# Patient Record
Sex: Male | Born: 1935
Health system: Southern US, Community
[De-identification: ages and names within clinical notes are randomized; demographics above are authoritative.]

## PROBLEM LIST (undated history)

## (undated) DIAGNOSIS — I219 Acute myocardial infarction, unspecified: Secondary | ICD-10-CM

## (undated) DIAGNOSIS — F419 Anxiety disorder, unspecified: Secondary | ICD-10-CM

## (undated) DIAGNOSIS — E66811 Obesity, class 1: Secondary | ICD-10-CM

## (undated) DIAGNOSIS — E785 Hyperlipidemia, unspecified: Secondary | ICD-10-CM

## (undated) DIAGNOSIS — M170 Bilateral primary osteoarthritis of knee: Secondary | ICD-10-CM

## (undated) DIAGNOSIS — E669 Obesity, unspecified: Secondary | ICD-10-CM

## (undated) DIAGNOSIS — I2510 Atherosclerotic heart disease of native coronary artery without angina pectoris: Secondary | ICD-10-CM

## (undated) DIAGNOSIS — R600 Localized edema: Secondary | ICD-10-CM

## (undated) DIAGNOSIS — Z860100 Personal history of colon polyps, unspecified: Secondary | ICD-10-CM

## (undated) DIAGNOSIS — Z9861 Coronary angioplasty status: Secondary | ICD-10-CM

## (undated) DIAGNOSIS — K219 Gastro-esophageal reflux disease without esophagitis: Secondary | ICD-10-CM

## (undated) DIAGNOSIS — G8929 Other chronic pain: Secondary | ICD-10-CM

## (undated) DIAGNOSIS — R609 Edema, unspecified: Secondary | ICD-10-CM

## (undated) DIAGNOSIS — I10 Essential (primary) hypertension: Secondary | ICD-10-CM

## (undated) DIAGNOSIS — M549 Dorsalgia, unspecified: Secondary | ICD-10-CM

## (undated) DIAGNOSIS — H269 Unspecified cataract: Secondary | ICD-10-CM

## (undated) DIAGNOSIS — C61 Malignant neoplasm of prostate: Secondary | ICD-10-CM

## (undated) DIAGNOSIS — G4733 Obstructive sleep apnea (adult) (pediatric): Secondary | ICD-10-CM

## (undated) DIAGNOSIS — I4819 Other persistent atrial fibrillation: Secondary | ICD-10-CM

## (undated) HISTORY — DX: Obesity, class 1: E66.811

## (undated) HISTORY — DX: Obstructive sleep apnea (adult) (pediatric): G47.33

## (undated) HISTORY — DX: Coronary angioplasty status: Z98.61

## (undated) HISTORY — PX: CHOLECYSTECTOMY: SHX55

## (undated) HISTORY — PX: CORONARY ANGIOPLASTY WITH STENT PLACEMENT: SHX49

## (undated) HISTORY — DX: Other persistent atrial fibrillation: I48.19

## (undated) HISTORY — DX: Bilateral primary osteoarthritis of knee: M17.0

## (undated) HISTORY — DX: Atherosclerotic heart disease of native coronary artery without angina pectoris: I25.10

## (undated) HISTORY — PX: OTHER SURGICAL HISTORY: SHX169

## (undated) HISTORY — PX: NEUROPLASTY / TRANSPOSITION MEDIAN NERVE AT CARPAL TUNNEL BILATERAL: SUR894

## (undated) HISTORY — DX: Obesity, unspecified: E66.9

## (undated) HISTORY — DX: Malignant neoplasm of prostate: C61

## (undated) HISTORY — PX: COLONOSCOPY: SHX174

---

## 1998-08-26 ENCOUNTER — Inpatient Hospital Stay (HOSPITAL_COMMUNITY): Admission: EM | Admit: 1998-08-26 | Discharge: 1998-08-27 | Payer: Self-pay | Admitting: *Deleted

## 1998-08-26 ENCOUNTER — Ambulatory Visit (HOSPITAL_BASED_OUTPATIENT_CLINIC_OR_DEPARTMENT_OTHER): Admission: RE | Admit: 1998-08-26 | Discharge: 1998-08-26 | Payer: Self-pay | Admitting: Orthopedic Surgery

## 1998-08-28 ENCOUNTER — Ambulatory Visit (HOSPITAL_COMMUNITY): Admission: RE | Admit: 1998-08-28 | Discharge: 1998-08-28 | Payer: Self-pay | Admitting: Cardiology

## 1998-08-28 ENCOUNTER — Encounter: Payer: Self-pay | Admitting: Cardiology

## 1999-03-24 ENCOUNTER — Ambulatory Visit (HOSPITAL_BASED_OUTPATIENT_CLINIC_OR_DEPARTMENT_OTHER): Admission: RE | Admit: 1999-03-24 | Discharge: 1999-03-24 | Payer: Self-pay | Admitting: Orthopedic Surgery

## 1999-11-15 ENCOUNTER — Ambulatory Visit (HOSPITAL_BASED_OUTPATIENT_CLINIC_OR_DEPARTMENT_OTHER): Admission: RE | Admit: 1999-11-15 | Discharge: 1999-11-15 | Payer: Self-pay | Admitting: Orthopedic Surgery

## 2000-09-25 ENCOUNTER — Ambulatory Visit (HOSPITAL_COMMUNITY): Admission: RE | Admit: 2000-09-25 | Discharge: 2000-09-25 | Payer: Self-pay | Admitting: Orthopedic Surgery

## 2000-09-25 ENCOUNTER — Encounter (INDEPENDENT_AMBULATORY_CARE_PROVIDER_SITE_OTHER): Payer: Self-pay

## 2002-03-18 ENCOUNTER — Encounter: Admission: RE | Admit: 2002-03-18 | Discharge: 2002-03-18 | Payer: Self-pay | Admitting: Orthopedic Surgery

## 2002-03-20 ENCOUNTER — Ambulatory Visit (HOSPITAL_BASED_OUTPATIENT_CLINIC_OR_DEPARTMENT_OTHER): Admission: RE | Admit: 2002-03-20 | Discharge: 2002-03-20 | Payer: Self-pay | Admitting: Orthopedic Surgery

## 2003-06-07 DIAGNOSIS — C61 Malignant neoplasm of prostate: Secondary | ICD-10-CM

## 2003-06-07 HISTORY — DX: Malignant neoplasm of prostate: C61

## 2003-06-07 HISTORY — PX: OTHER SURGICAL HISTORY: SHX169

## 2003-09-23 ENCOUNTER — Ambulatory Visit: Admission: RE | Admit: 2003-09-23 | Discharge: 2003-12-22 | Payer: Self-pay | Admitting: Radiation Oncology

## 2003-10-28 ENCOUNTER — Encounter: Admission: RE | Admit: 2003-10-28 | Discharge: 2003-10-28 | Payer: Self-pay | Admitting: Urology

## 2003-11-04 ENCOUNTER — Ambulatory Visit (HOSPITAL_COMMUNITY): Admission: RE | Admit: 2003-11-04 | Discharge: 2003-11-04 | Payer: Self-pay | Admitting: Urology

## 2003-11-04 ENCOUNTER — Ambulatory Visit (HOSPITAL_BASED_OUTPATIENT_CLINIC_OR_DEPARTMENT_OTHER): Admission: RE | Admit: 2003-11-04 | Discharge: 2003-11-04 | Payer: Self-pay | Admitting: Urology

## 2004-01-09 ENCOUNTER — Ambulatory Visit: Admission: RE | Admit: 2004-01-09 | Discharge: 2004-04-08 | Payer: Self-pay | Admitting: Radiation Oncology

## 2005-09-06 ENCOUNTER — Encounter: Admission: RE | Admit: 2005-09-06 | Discharge: 2005-09-06 | Payer: Self-pay | Admitting: Cardiology

## 2006-03-06 ENCOUNTER — Emergency Department (HOSPITAL_COMMUNITY): Admission: EM | Admit: 2006-03-06 | Discharge: 2006-03-06 | Payer: Self-pay | Admitting: Emergency Medicine

## 2007-08-27 ENCOUNTER — Encounter: Admission: RE | Admit: 2007-08-27 | Discharge: 2007-08-27 | Payer: Self-pay | Admitting: Cardiology

## 2007-12-01 ENCOUNTER — Emergency Department (HOSPITAL_COMMUNITY): Admission: EM | Admit: 2007-12-01 | Discharge: 2007-12-01 | Payer: Self-pay | Admitting: Emergency Medicine

## 2010-01-28 ENCOUNTER — Encounter: Admission: RE | Admit: 2010-01-28 | Discharge: 2010-01-28 | Payer: Self-pay | Admitting: Cardiology

## 2010-02-02 ENCOUNTER — Ambulatory Visit (HOSPITAL_COMMUNITY): Admission: RE | Admit: 2010-02-02 | Discharge: 2010-02-03 | Payer: Self-pay | Admitting: Cardiology

## 2010-02-12 ENCOUNTER — Ambulatory Visit (HOSPITAL_COMMUNITY): Admission: RE | Admit: 2010-02-12 | Discharge: 2010-02-13 | Payer: Self-pay | Admitting: Cardiology

## 2010-08-19 LAB — BASIC METABOLIC PANEL
BUN: 25 mg/dL — ABNORMAL HIGH (ref 6–23)
CO2: 25 mEq/L (ref 19–32)
Calcium: 8.6 mg/dL (ref 8.4–10.5)
Calcium: 9 mg/dL (ref 8.4–10.5)
Chloride: 102 mEq/L (ref 96–112)
Chloride: 100 mEq/L (ref 96–112)
Creatinine, Ser: 1 mg/dL (ref 0.4–1.5)
Creatinine, Ser: 1.04 mg/dL (ref 0.4–1.5)
GFR calc Af Amer: >60 mL/min (ref >60)
GFR calc non Af Amer: >60 mL/min (ref >60)
Glucose, Bld: 117 mg/dL — ABNORMAL HIGH (ref 70–99)
Glucose, Bld: 148 mg/dL — ABNORMAL HIGH (ref 70–99)
Glucose, Bld: 166 mg/dL — ABNORMAL HIGH (ref 70–99)
Potassium: 3.9 mEq/L (ref 3.5–5.1)
Potassium: 4.2 mEq/L (ref 3.5–5.1)
Potassium: 3.7 mEq/L (ref 3.5–5.1)
Sodium: 136 mEq/L (ref 135–145)
Sodium: 137 mEq/L (ref 135–145)

## 2010-08-19 LAB — CBC
MCHC: 34.4 g/dL (ref 30.0–36.0)
MCV: 93.7 fL (ref 78.0–100.0)
MCV: 94.1 fL (ref 78.0–100.0)
RBC: 4.41 MIL/uL (ref 4.22–5.81)
RBC: 4.52 MIL/uL (ref 4.22–5.81)
RDW: 13.2 % (ref 11.5–15.5)
WBC: 15.7 10*3/uL — ABNORMAL HIGH (ref 4.0–10.5)

## 2010-08-19 LAB — PROTIME-INR: INR: 0.98 (ref 0.00–1.49)

## 2010-10-22 NOTE — Op Note (Signed)
[facility]  Patient:    Jerry [name], Jerry [name]                   MRN: [id number] Proc. Date: 11/15/99 Adm. Date:  [id number] Disc. Date: [id number] Attending:  Alinda Deem                           Operative Report  PREOPERATIVE DIAGNOSIS:  Right knee medial meniscal tear.  POSTOPERATIVE DIAGNOSIS:  Right knee medial meniscal tear, lateral meniscal tear as well as chondromalacia lateral femoral condyle grade III to grade IV focal over an 8 mm area.  OPERATION:  Right knee arthroscopic partial medial and lateral meniscectomies and debridement of chondromalacia from the lateral femoral condyle.  SURGEON:  Alinda Deem, M.D.  FIRST ASSISTANT:  Dorthula Matas, P.A.C.  ANESTHESIA:  General LMA.  ESTIMATED BLOOD LOSS:  Minimal.  FLUID REPLACEMENT:  800 cc of crystalloid.  DRAINS PLACED:  None.  TOURNIQUET TIME:  None.  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  He feels clicking, popping and catching in the knee and pain that prevents him from walking more than 10-15 minutes before he must sit down.  He has also had recurrent effusions.  A plain radiograph showed loss of articular cartilage of the medial side of the knee consistent with medial joint arthritis.  In any event, having failed conservative treatment over many months, he now desires arthroscopic evaluation and  treatment of his right knee.  He had similar treatment to his left knee some months ago and has done well.  DESCRIPTION OF PROCEDURE:  The patient was identified by arm band and taken to the operating room at [facility].  Appropriate anesthetic monitors were attached,  and general LMA anesthesia induced with the patient in the supine position.  A lateral post was supplied to the table and the right lower extremity prepped and draped in the usual sterile fashion from the ankle to the midthigh.  Standard inferior medial and inferior lateral parapatellar portal were then made with a #11 blade allowing introduction of the arthroscope at the inferolateral portal and the outflow was ______ the medial portal.  Small bits of articular cartilage were noted to come through the outflow.  The trochlea and patella had grade I to grade II chondromalacia that was already lightly debrided.  Moving to the medial compartment we identified a large para big tear of the medial meniscus that was actually tucked up medial to the femoral condyle and required removal with a 3.5 Gator sucker shaver and pituitary rongeurs.  We then trimmed the rim of the medial meniscus back to a smooth edge and also noted chondromalacia of the medial femoral condyle of grade III which was also debrided.  The ACL and PCL were intact on the lateral side.  He had a degenerative tearing of the lateral meniscus which was debrided with a 3.5 mm Gator sucker shaver.  The gutters were cleared then he was taken through a range of motion and small bits of articular cartilage continuously taken out through the outflow.  The knee was then washed out with normal saline solution.  The arthroscopic instruments removed.  A dressing  of Xeroform, 4 x 8 dressings, sponges, Webril and an Ace wrap applied.  The patient did receive a gram of Ancef perioperatively because he was taking prednisone for a skin condition over the last week or two. DD:  11/15/99 TD:  11/18/99 Job: [id number]

## 2010-10-22 NOTE — Op Note (Signed)
[facility]  Patient:    Jerry [name], Jerry [name]                   MRN: [id number] Attending:  Artist Pais. Mina Marble, M.D.                           Operative Report  DATE OF SERVICE:  09/25/00  PREOPERATIVE DIAGNOSIS:  Right small, and right ring ______ synovitis.  POSTOPERATIVE DIAGNOSIS:  Same.  PROCEDURE:  A1 pulley release right long, ring, and small fingers.  SURGEON:  Artist Pais. Mina Marble, M.D.  ANESTHESIA:  General  TOURNIQUET TIME:  18 minutes.  COMPLICATIONS:  None.  DRAINS:  None.  DESCRIPTION OF PROCEDURE:  The patient was taken to the operating room and after the induction of adequate general anesthesia the right upper extremity was prepped and draped in the usual sterile fashion.  Esmarch was used to exsanguinate the limb and tourniquet was inflated to 250 mmHg at this point in time.  Transverse incisions were made over the A1 pulleys of the long, ring, and small fingers of the right hand.  Incision was taken down through the skin and and subcutaneous tissue until the neurovascular bundles were identified. These were retracted to the sides and the flexor tendons were identified.  The 15 blade was used to incise the A1 pulley and complete releases were performed on the long, ring, and small fingers of the right hand.  After this was done, the wounds were thoroughly irrigated and closed with 5-0 nylon and horizontal mattress sutures.  Sterile dressings, Xeroform, and 4 x 4s ______ and compression dressing was applied.  The patient tolerated the procedure well and went to recovery room in stable fashion. DD:  11/15/00 TD:  11/15/00 Job: [id number]

## 2010-10-22 NOTE — Op Note (Signed)
[facility]  Patient:    Jerry [name], Jerry [name]                 MRN: [id number] Proc. Date: 09/25/00 Adm. Date:  [id number] Attending:  Marlowe Shores                           Operative Report  PREOPERATIVE DIAGNOSIS:  Ventral incisional hernia versus umbilical hernia.  POSTOPERATIVE DIAGNOSIS:  Ventral incisional hernia.  OPERATION:  Exploration of hernia and repair with mesh.  SURGEON:  Timothy E. Earlene Plater, M.D.  ANESTHESIA:  General.  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  Because of its size, discomfort, and enlargement, he wished to have that repaired.  This is done in conjunction with Dr. Mina Marble with repair of right hand.  The patient agrees and understands the procedure.  DESCRIPTION OF PROCEDURE:  The patient was taken to the operating room by Dr. Mina Marble, placed supine.  General anesthesia induced.  His surgery was completed.  The patient was stable at the conclusion of the procedure, and the abdomen was shaved, prepped and draped in the usual fashion.  A moderate hernia was palpable and also reducible.  I entered the area through the previous infraumbilical incision from the laparoscopic cholecystectomy, dissected the umbilical skin loose, and found the fascia dehisced as would be typically seen following a laparoscopic cholecystectomy.  It did not appear to be a standard umbilical hernia.  The hernia had enlarged.  It was approximately 3 cm in horizontal length.  The sac was debrided and removed and passed off the field as a specimen.  The edges identified.  I dissected back from the edges approximately 2 cm all around and about the defect.  I closed the defect fascia to fascia with a running 0 Prolene suture, covered this with mesh, a 1 x 4, and sutured the mesh to the surrounding stronger fascia with running 2-0 PDS.   This completed the procedure.  The counts were correct.  No bleeding was obvious, and the wound was closed in layers with 4-0 Monocryl. Steri-Strips applied.  He tolerated it well.  Final counts correct.  Dry, sterile dressing applied.  He was awakened and taken to the recovery room in good condition. DD:  09/25/00 TD:  09/24/00 Job: [id number]

## 2010-10-22 NOTE — Op Note (Signed)
   NAME:  Jerry [name], Jerry [name]                    ACCOUNT NO.:  [id number]   MEDICAL RECORD NO.:  [id number]                   PATIENT TYPE:  AMB   LOCATION:  DSC                                  FACILITY:  [facility]   PHYSICIAN:  Artist Pais. Mina Marble, M.D.           DATE OF BIRTH:  [birth year]   DATE OF PROCEDURE:  03/20/2002  DATE OF DISCHARGE:                                 OPERATIVE REPORT   PREOPERATIVE DIAGNOSIS:  Right index finger stenosing synovitis.   POSTOPERATIVE DIAGNOSIS:  Right index finger stenosing synovitis.   PROCEDURE:  Release A1 pulley right index finger.   SURGEON:  Artist Pais. Mina Marble, M.D.   ASSISTANT:  Nurse.   ANESTHESIA:  General.   TOURNIQUET TIME:  11 minutes.   COMPLICATIONS:  None.   DRAINS:  No.   DESCRIPTION OF PROCEDURE:  The patient was taken to the operating room and  given adequate general anesthesia.  Right upper extremity prepped and draped  in usual sterile fashion.  An Esmarch was used to exsanguinate the limb.  Tourniquet was inflated to 250 mmHg.  At this point in time, a 1.5 cm  incision was made transversely over the A1 pulley index finger in the palm  of the right hand.  Incision was taken down through skin and subcutaneous  tissues.  Neurovascular bundles were identified and retracted.  Proximal A1  pulley was identified, split with tenotomy scissors, thus freeing up the FDS  and FP tendons.  The tendons were lysed of all adhesions.  The wound was  thoroughly irrigated and then closed with 5-0 nylon and horizontal mattress  sutures x 3.  Sterile dressing, Xeroform, 4 x 4 fluffs, compressive hand  dressing applied.  The patient tolerated the procedure well and was taken to  the Recovery Room in stable condition.                                               Artist Pais Mina Marble, M.D.    MAW/MEDQ  D:  03/20/2002  T:  03/21/2002  Job:  [id number]

## 2010-10-22 NOTE — Op Note (Signed)
NAME:  [name], [name]                    ACCOUNT NO.:  [id number]   MEDICAL RECORD NO.:  [id number]                   PATIENT TYPE:  OUT   LOCATION:  DFTL                                 FACILITY:  [facility]   PHYSICIAN:  Valetta Fuller, M.D.               DATE OF BIRTH:  12/02/1929   DATE OF PROCEDURE:  11/04/2003  DATE OF DISCHARGE:  11/04/2003                                 OPERATIVE REPORT   PREOPERATIVE DIAGNOSES:  Clinical stage T1C adenocarcinoma of the prostate.   POSTOPERATIVE DIAGNOSES:  Clinical stage T1C adenocarcinoma of the prostate.   PROCEDURE:  1. Implantation of I125 seeds.  2. Cystoscopy.   SURGEON:  Valetta Fuller, M.D.   ASSISTANT:  Maryln Gottron, M.D.   ANESTHESIA:  General.   INDICATIONS FOR PROCEDURE:  [name] is a 77 year old male.  He had  elevated PSA approximately a year ago which was negative. More recently his  PSA had increased to 7.1.  Repeat biopsy revealed positive biopsies for  Gleason 3+3=6 adenocarcinoma on the left side of his prostate. The patient  underwent extensive counseling with regard to treatment options. We  discussed with him the possibility of watchful waiting versus a radiation  approach. We also offered surgical intervention but felt that might be over  aggressive given his situation. After much discussion and consultation with  Dr. Dayton Scrape, the patient was elected to have interstitial seed implantation  as monotherapy.  He presents now for the procedure.   TECHNIQUE:  The patient was brought to the operating room where he had  induction of general anesthesia.  He was placed in the moderate lithotomy  position and prepped and draped in the normal manner.  A Foley catheter was  placed with contrast in the balloon. A fluoroscopy unit was brought in. A  real-time ultrasound probe was placed and fixed carefully to the table.  We  spent the initial 30-45 minutes getting the patient in the proper position.  We  attempted to place the prostate in exactly the same position as it was on  his planning ultrasound. Real-time voluming was done.  We matched up the  pretreatment plan and were very pleased with that.  Anchoring needles were  then placed. Once we were happy with positioning, we utilized real-time  fluoroscopy as well as real-time ultrasonography for placement of the seeds.  A total of 86 seeds were placed for a total dose of 14,400 cGy.  Each seed  had activity of 0.45 mCi per seed.  Again each needle passage was done with  real-time fluoroscopy as well as real-time ultrasonography. We matched this  up with the pretreatment plan and were very happy with the seed  distribution. At the completion of the procedure, the catheter was removed  and cystoscopy was performed. No seeds were seen within the prostatic  urethra or bladder. Another Foley catheter was placed and hooked up to a leg  bag.  The patient appeared to tolerate the procedure well and there were no  obvious complications.                                               Valetta Fuller, M.D.    DSG/MEDQ  D:  11/20/2003  T:  11/20/2003  Job:  [id number]   cc:   Maryln Gottron, M.D.  [address] [facility]  [place]  [place] [address]  Fax: [phone]

## 2010-11-24 HISTORY — PX: NM MYOVIEW LTD: HXRAD82

## 2011-03-03 LAB — COMPREHENSIVE METABOLIC PANEL
ALT: 9
AST: 25
Albumin: 3.8
CO2: 26
Calcium: 9
Creatinine, Ser: 1.06
GFR calc Af Amer: >60
Glucose, Bld: 100 — ABNORMAL HIGH
Total Bilirubin: 1.7 — ABNORMAL HIGH
Total Protein: 6.5

## 2011-03-03 LAB — DIFFERENTIAL
Basophils Absolute: 0
Lymphs Abs: 0.9
Monocytes Relative: 4
Neutro Abs: 15 — ABNORMAL HIGH

## 2011-03-03 LAB — POCT CARDIAC MARKERS
CKMB, poc: 1.5
Myoglobin, poc: 154
Operator id: 284251
Troponin i, poc: <0.05

## 2011-03-03 LAB — CBC
HCT: 47.8
Hemoglobin: 16.3
MCHC: 34
MCV: 93.4
Platelets: 269

## 2011-08-17 DIAGNOSIS — I2510 Atherosclerotic heart disease of native coronary artery without angina pectoris: Secondary | ICD-10-CM | POA: Diagnosis not present

## 2011-08-17 DIAGNOSIS — I10 Essential (primary) hypertension: Secondary | ICD-10-CM | POA: Diagnosis not present

## 2011-08-17 DIAGNOSIS — E782 Mixed hyperlipidemia: Secondary | ICD-10-CM | POA: Diagnosis not present

## 2011-08-18 DIAGNOSIS — M47817 Spondylosis without myelopathy or radiculopathy, lumbosacral region: Secondary | ICD-10-CM | POA: Diagnosis not present

## 2011-11-29 DIAGNOSIS — T148XXA Other injury of unspecified body region, initial encounter: Secondary | ICD-10-CM | POA: Diagnosis not present

## 2011-11-29 DIAGNOSIS — Z0000 Encounter for general adult medical examination without abnormal findings: Secondary | ICD-10-CM | POA: Diagnosis not present

## 2011-11-29 DIAGNOSIS — F411 Generalized anxiety disorder: Secondary | ICD-10-CM | POA: Diagnosis not present

## 2011-11-29 DIAGNOSIS — M159 Polyosteoarthritis, unspecified: Secondary | ICD-10-CM | POA: Diagnosis not present

## 2011-11-29 DIAGNOSIS — D485 Neoplasm of uncertain behavior of skin: Secondary | ICD-10-CM | POA: Diagnosis not present

## 2011-12-14 DIAGNOSIS — M8448XA Pathological fracture, other site, initial encounter for fracture: Secondary | ICD-10-CM | POA: Diagnosis not present

## 2012-02-29 DIAGNOSIS — M1710 Unilateral primary osteoarthritis, unspecified knee: Secondary | ICD-10-CM | POA: Diagnosis not present

## 2012-03-07 DIAGNOSIS — Z79899 Other long term (current) drug therapy: Secondary | ICD-10-CM | POA: Diagnosis not present

## 2012-03-07 DIAGNOSIS — R5383 Other fatigue: Secondary | ICD-10-CM | POA: Diagnosis not present

## 2012-03-07 DIAGNOSIS — M1710 Unilateral primary osteoarthritis, unspecified knee: Secondary | ICD-10-CM | POA: Diagnosis not present

## 2012-03-07 DIAGNOSIS — E782 Mixed hyperlipidemia: Secondary | ICD-10-CM | POA: Diagnosis not present

## 2012-03-13 DIAGNOSIS — Z9861 Coronary angioplasty status: Secondary | ICD-10-CM | POA: Diagnosis not present

## 2012-03-13 DIAGNOSIS — I10 Essential (primary) hypertension: Secondary | ICD-10-CM | POA: Diagnosis not present

## 2012-03-13 DIAGNOSIS — I2510 Atherosclerotic heart disease of native coronary artery without angina pectoris: Secondary | ICD-10-CM | POA: Diagnosis not present

## 2012-03-13 DIAGNOSIS — R0602 Shortness of breath: Secondary | ICD-10-CM | POA: Diagnosis not present

## 2012-03-22 DIAGNOSIS — Z23 Encounter for immunization: Secondary | ICD-10-CM | POA: Diagnosis not present

## 2012-04-04 DIAGNOSIS — M1710 Unilateral primary osteoarthritis, unspecified knee: Secondary | ICD-10-CM | POA: Diagnosis not present

## 2012-04-06 HISTORY — PX: NM MYOVIEW LTD: HXRAD82

## 2012-04-10 DIAGNOSIS — Z01810 Encounter for preprocedural cardiovascular examination: Secondary | ICD-10-CM | POA: Diagnosis not present

## 2012-04-10 DIAGNOSIS — R0989 Other specified symptoms and signs involving the circulatory and respiratory systems: Secondary | ICD-10-CM | POA: Diagnosis not present

## 2012-04-10 DIAGNOSIS — R0602 Shortness of breath: Secondary | ICD-10-CM | POA: Diagnosis not present

## 2012-04-10 DIAGNOSIS — I2510 Atherosclerotic heart disease of native coronary artery without angina pectoris: Secondary | ICD-10-CM | POA: Diagnosis not present

## 2012-04-19 ENCOUNTER — Other Ambulatory Visit: Payer: Self-pay | Admitting: Orthopedic Surgery

## 2012-04-26 ENCOUNTER — Encounter (HOSPITAL_COMMUNITY): Payer: Self-pay | Admitting: Respiratory Therapy

## 2012-04-27 DIAGNOSIS — H2510 Age-related nuclear cataract, unspecified eye: Secondary | ICD-10-CM | POA: Diagnosis not present

## 2012-04-30 ENCOUNTER — Encounter (HOSPITAL_COMMUNITY)
Admission: RE | Admit: 2012-04-30 | Discharge: 2012-04-30 | Disposition: A | Discharge status: Home / Self Care | Payer: Medicare Other | Source: Ambulatory Visit | Attending: Orthopedic Surgery | Admitting: Orthopedic Surgery

## 2012-04-30 ENCOUNTER — Encounter (HOSPITAL_COMMUNITY): Payer: Self-pay

## 2012-04-30 DIAGNOSIS — J984 Other disorders of lung: Secondary | ICD-10-CM | POA: Diagnosis not present

## 2012-04-30 DIAGNOSIS — Z01818 Encounter for other preprocedural examination: Secondary | ICD-10-CM | POA: Diagnosis not present

## 2012-04-30 HISTORY — DX: Dorsalgia, unspecified: M54.9

## 2012-04-30 HISTORY — DX: Personal history of colonic polyps: Z86.010

## 2012-04-30 HISTORY — DX: Unspecified cataract: H26.9

## 2012-04-30 HISTORY — DX: Hyperlipidemia, unspecified: E78.5

## 2012-04-30 HISTORY — DX: Acute myocardial infarction, unspecified: I21.9

## 2012-04-30 HISTORY — DX: Other chronic pain: G89.29

## 2012-04-30 HISTORY — DX: Edema, unspecified: R60.9

## 2012-04-30 HISTORY — DX: Localized edema: R60.0

## 2012-04-30 HISTORY — DX: Essential (primary) hypertension: I10

## 2012-04-30 HISTORY — DX: Anxiety disorder, unspecified: F41.9

## 2012-04-30 HISTORY — DX: Personal history of colon polyps, unspecified: Z86.0100

## 2012-04-30 HISTORY — DX: Gastro-esophageal reflux disease without esophagitis: K21.9

## 2012-04-30 LAB — CBC WITH DIFFERENTIAL/PLATELET
Basophils Absolute: 0.1 10*3/uL (ref 0.0–0.1)
Basophils Relative: 1 % (ref 0–1)
HCT: 43.6 % (ref 39.0–52.0)
Lymphocytes Relative: 22 % (ref 12–46)
MCHC: 35.8 g/dL (ref 30.0–36.0)
Monocytes Absolute: 0.5 10*3/uL (ref 0.1–1.0)
Neutro Abs: 6.3 10*3/uL (ref 1.7–7.7)
Neutrophils Relative %: 69 % (ref 43–77)
Platelets: 189 10*3/uL (ref 150–400)
RDW: 12.7 % (ref 11.5–15.5)
WBC: 9.1 10*3/uL (ref 4.0–10.5)

## 2012-04-30 LAB — BASIC METABOLIC PANEL
BUN: 19 mg/dL (ref 6–23)
Chloride: 102 mEq/L (ref 96–112)
Creatinine, Ser: 0.97 mg/dL (ref 0.50–1.35)
GFR calc Af Amer: 88 mL/min — ABNORMAL LOW (ref >90)
GFR calc non Af Amer: 76 mL/min — ABNORMAL LOW (ref >90)

## 2012-04-30 LAB — URINE MICROSCOPIC-ADD ON

## 2012-04-30 LAB — URINALYSIS, ROUTINE W REFLEX MICROSCOPIC
Bilirubin Urine: NEGATIVE
Glucose, UA: NEGATIVE mg/dL
Hgb urine dipstick: NEGATIVE
Protein, ur: 30 mg/dL — AB
Urobilinogen, UA: 1 mg/dL (ref 0.0–1.0)

## 2012-04-30 LAB — TYPE AND SCREEN
ABO/RH(D): A POS
Antibody Screen: NEGATIVE

## 2012-04-30 LAB — PROTIME-INR
INR: 1.04 (ref 0.00–1.49)
Prothrombin Time: 13.5 seconds (ref 11.6–15.2)

## 2012-04-30 LAB — SURGICAL PCR SCREEN
MRSA, PCR: NEGATIVE
Staphylococcus aureus: NEGATIVE

## 2012-04-30 LAB — APTT: aPTT: 28 seconds (ref 24–37)

## 2012-04-30 MED ORDER — CHLORHEXIDINE GLUCONATE 4 % EX LIQD: 60.0000 mL | Freq: Once | CUTANEOUS | Status: DC

## 2012-04-30 NOTE — Progress Notes (Signed)
Pt has never had a sleep study but states he is a mouth breather

## 2012-04-30 NOTE — Progress Notes (Signed)
04/30/12 1048  OBSTRUCTIVE SLEEP APNEA  Do you snore loudly (loud enough to be heard through closed doors)?  0  Do you often feel tired, fatigued, or sleepy during the daytime? 0  Has anyone observed you stop breathing during your sleep? 0  Do you have, or are you being treated for high blood pressure? 1  BMI more than 35 kg/m2? 1  Age over 76 years old? 1  Neck circumference greater than 40 cm/18 inches? 1  Gender: 1  Obstructive Sleep Apnea Score 5   Score 4 or greater  Results sent to PCP

## 2012-04-30 NOTE — Progress Notes (Addendum)
Cardiologist is Dr.Harding and to request last office visit  Stress test done about 2wks ago with Dr.Harding-to request  Heart cath in epic from 02/02/10 Denies ever having an echo  Denies CXR being done within the past yr EKG to be requested from Dr.Harding   Medical MD is Dr.Dean Clovis Riley with [facility]

## 2012-04-30 NOTE — Progress Notes (Signed)
Faxed sleep apnea form to Dr.Dean Clovis Riley

## 2012-04-30 NOTE — Pre-Procedure Instructions (Signed)
[address]  04/30/2012   Your procedure is scheduled on:  Wed, Dec 4 @ 12:15 PM  Report to [facility] at 10:15 AM.  Call this number if you have problems the morning of surgery: [phone]   Remember:   Do not eat food:After Midnight.    Take these medicines the morning of surgery with A SIP OF WATER: Valium(Diazepam),Esomeprazole(Nexium), and Metoprolol(Toprol)   Do not wear jewelry  Do not wear lotions, powders, or colognes. You may wear deodorant.  Men may shave face and neck.  Do not bring valuables to the hospital.  Contacts, dentures or bridgework may not be worn into surgery.  Leave suitcase in the car. After surgery it may be brought to your room.  For patients admitted to the hospital, checkout time is 11:00 AM the day of discharge.   Patients discharged the day of surgery will not be allowed to drive home.    Special Instructions: Shower using CHG 2 nights before surgery and the night before surgery.  If you shower the day of surgery use CHG.  Use special wash - you have one bottle of CHG for all showers.  You should use approximately 1/3 of the bottle for each shower.   Please read over the following fact sheets that you were given: Pain Booklet, Coughing and Deep Breathing, Blood Transfusion Information, Total Joint Packet, MRSA Information and Surgical Site Infection Prevention

## 2012-05-08 MED ORDER — CEFAZOLIN SODIUM-DEXTROSE 2-3 GM-% IV SOLR
2.0000 g | INTRAVENOUS | Status: AC
  Administered 2012-05-09: 2 g via INTRAVENOUS
  Filled 2012-05-08: qty 50

## 2012-05-08 NOTE — H&P (Signed)
TOTAL KNEE ADMISSION H&P  Patient is being admitted for right total knee arthroplasty.  Subjective:  Chief Complaint:right knee pain.  HPI: Jerry [name], 78 y.o. male, has a history of pain and functional disability in the right knee due to arthritis and has failed non-surgical conservative treatments for greater than 12 weeks to includeNSAID's and/or analgesics, corticosteriod injections, use of assistive devices and activity modification.  Onset of symptoms was gradual, starting >10 years ago with gradually worsening course since that time. The patient noted no past surgery on the right knee(s).  Patient currently rates pain in the right knee(s) at 7 out of 10 with activity. Patient has night pain, worsening of pain with activity and weight bearing and pain that interferes with activities of daily living.  Patient has evidence of subchondral cysts, periarticular osteophytes, joint subluxation and joint space narrowing by imaging studies. This patient has had several episodes when he nearly fell because of his knee pain.. There is no active infection.  There are no active problems to display for this patient.  Past Medical History  Diagnosis Date  . Hypertension     takes Verapamil,Losartan,and Metoprolol daily   . Peripheral edema     takes HCTZ daily  . Hyperlipidemia     takes Lipitor nightly  . Coronary artery disease   . Myocardial infarction     x 2;last time was about 8-39yrs ago   . Joint pain   . Chronic back pain     scoliosis--pt states unable to have surgery bc of age  . GERD (gastroesophageal reflux disease)     takes Nexium daily  . History of colon polyps   . Urinary frequency     takes Vesicare and Flomax daily  . Urinary urgency   . Cancer     prostate cancer  . Cataracts, bilateral     immature  . Anxiety     takes Valium prn  . Bleeding nose     occasionally    Past Surgical History  Procedure Date  . Right knee arthrsoscopy 46yrs ago  . Cyst  removed in college  . Coronary angioplasty 02/02/10    2 stents  . Wisdom teeth extraccted   . Colonoscopy   . Seeds placed into prostate    . Cholecystectomy   . Neuroplasty / transposition median nerve at carpal tunnel bilateral   . Right trigger finger     No prescriptions prior to admission   Allergies  Allergen Reactions  . Crab (Shellfish Allergy)   . Adhesive (Tape) Rash    History  Substance Use Topics  . Smoking status: Not on file  . Smokeless tobacco: Not on file     Comment: quit smoking pipe about 21yrs ago  . Alcohol Use: Yes     Comment: glass of wine daily    No family history on file.   Review of Systems  Constitutional: Negative.   HENT: Negative.   Eyes: Negative.   Respiratory: Negative.   Cardiovascular: Negative.   Gastrointestinal: Negative.   Genitourinary: Negative.   Musculoskeletal: Positive for joint pain and falls.  Skin: Negative.   Neurological: Negative.   Endo/Heme/Allergies: Negative.   Psychiatric/Behavioral: Negative.     Objective:  Physical Exam  Constitutional: He is oriented to person, place, and time. He appears well-developed and well-nourished.  HENT:  Head: Normocephalic.  Eyes: Pupils are equal, round, and reactive to light.  Cardiovascular: Normal heart sounds.   Respiratory: Breath sounds normal.  GI: Bowel sounds are normal.  Musculoskeletal:       Right knee: He exhibits decreased range of motion, effusion and deformity.  Neurological: He is alert and oriented to person, place, and time.    Vital signs in last 24 hours:    Labs:   There is no height or weight on file to calculate BMI.   Imaging Review Plain radiographs demonstrate severe degenerative joint disease of the right knee(s). The overall alignment issignificant varus. The bone quality appears to be adequate for age and reported activity level.  Assessment/Plan:  End stage arthritis, right knee   The patient history, physical examination,  clinical judgment of the provider and imaging studies are consistent with end stage degenerative joint disease of the right knee(s) and total knee arthroplasty is deemed medically necessary. The treatment options including medical management, injection therapy arthroscopy and arthroplasty were discussed at length. The risks and benefits of total knee arthroplasty were presented and reviewed. The risks due to aseptic loosening, infection, stiffness, patella tracking problems, thromboembolic complications and other imponderables were discussed. The patient acknowledged the explanation, agreed to proceed with the plan and consent was signed. Patient is being admitted for inpatient treatment for surgery, pain control, PT, OT, prophylactic antibiotics, VTE prophylaxis, progressive ambulation and ADL's and discharge planning. The patient is planning to be discharged to skilled nursing facility

## 2012-05-09 ENCOUNTER — Encounter (HOSPITAL_COMMUNITY)
Admission: RE | Disposition: A | Discharge status: Skilled Nursing Facility | Payer: Self-pay | Source: Ambulatory Visit | Attending: Orthopedic Surgery

## 2012-05-09 ENCOUNTER — Encounter (HOSPITAL_COMMUNITY): Payer: Self-pay | Admitting: *Deleted

## 2012-05-09 ENCOUNTER — Inpatient Hospital Stay (HOSPITAL_COMMUNITY): Payer: Medicare Other | Admitting: *Deleted

## 2012-05-09 ENCOUNTER — Inpatient Hospital Stay (HOSPITAL_COMMUNITY)
Admission: RE | Admit: 2012-05-09 | Discharge: 2012-05-12 | DRG: 470 | Disposition: A | Discharge status: Skilled Nursing Facility | Payer: Medicare Other | Source: Ambulatory Visit | Attending: Orthopedic Surgery | Admitting: Orthopedic Surgery

## 2012-05-09 DIAGNOSIS — I10 Essential (primary) hypertension: Secondary | ICD-10-CM | POA: Diagnosis not present

## 2012-05-09 DIAGNOSIS — M4120 Other idiopathic scoliosis, site unspecified: Secondary | ICD-10-CM | POA: Diagnosis present

## 2012-05-09 DIAGNOSIS — Z01818 Encounter for other preprocedural examination: Secondary | ICD-10-CM

## 2012-05-09 DIAGNOSIS — M1711 Unilateral primary osteoarthritis, right knee: Secondary | ICD-10-CM | POA: Diagnosis present

## 2012-05-09 DIAGNOSIS — Z01812 Encounter for preprocedural laboratory examination: Secondary | ICD-10-CM

## 2012-05-09 DIAGNOSIS — Z8546 Personal history of malignant neoplasm of prostate: Secondary | ICD-10-CM | POA: Diagnosis not present

## 2012-05-09 DIAGNOSIS — M1710 Unilateral primary osteoarthritis, unspecified knee: Principal | ICD-10-CM | POA: Diagnosis present

## 2012-05-09 DIAGNOSIS — IMO0002 Reserved for concepts with insufficient information to code with codable children: Secondary | ICD-10-CM | POA: Diagnosis not present

## 2012-05-09 DIAGNOSIS — R350 Frequency of micturition: Secondary | ICD-10-CM | POA: Diagnosis present

## 2012-05-09 DIAGNOSIS — I252 Old myocardial infarction: Secondary | ICD-10-CM

## 2012-05-09 DIAGNOSIS — E669 Obesity, unspecified: Secondary | ICD-10-CM | POA: Diagnosis present

## 2012-05-09 DIAGNOSIS — R279 Unspecified lack of coordination: Secondary | ICD-10-CM | POA: Diagnosis not present

## 2012-05-09 DIAGNOSIS — M549 Dorsalgia, unspecified: Secondary | ICD-10-CM | POA: Diagnosis not present

## 2012-05-09 DIAGNOSIS — Z9861 Coronary angioplasty status: Secondary | ICD-10-CM | POA: Diagnosis not present

## 2012-05-09 DIAGNOSIS — R609 Edema, unspecified: Secondary | ICD-10-CM | POA: Diagnosis present

## 2012-05-09 DIAGNOSIS — M6281 Muscle weakness (generalized): Secondary | ICD-10-CM | POA: Diagnosis not present

## 2012-05-09 DIAGNOSIS — Z96659 Presence of unspecified artificial knee joint: Secondary | ICD-10-CM | POA: Diagnosis not present

## 2012-05-09 DIAGNOSIS — I2109 ST elevation (STEMI) myocardial infarction involving other coronary artery of anterior wall: Secondary | ICD-10-CM | POA: Diagnosis not present

## 2012-05-09 DIAGNOSIS — E785 Hyperlipidemia, unspecified: Secondary | ICD-10-CM | POA: Diagnosis present

## 2012-05-09 DIAGNOSIS — F411 Generalized anxiety disorder: Secondary | ICD-10-CM | POA: Diagnosis not present

## 2012-05-09 DIAGNOSIS — M25569 Pain in unspecified knee: Secondary | ICD-10-CM | POA: Diagnosis not present

## 2012-05-09 DIAGNOSIS — Z86010 Personal history of colonic polyps: Secondary | ICD-10-CM | POA: Diagnosis not present

## 2012-05-09 DIAGNOSIS — K219 Gastro-esophageal reflux disease without esophagitis: Secondary | ICD-10-CM | POA: Diagnosis present

## 2012-05-09 DIAGNOSIS — K2100 Gastro-esophageal reflux disease with esophagitis, without bleeding: Secondary | ICD-10-CM | POA: Diagnosis not present

## 2012-05-09 DIAGNOSIS — Z471 Aftercare following joint replacement surgery: Secondary | ICD-10-CM | POA: Diagnosis not present

## 2012-05-09 DIAGNOSIS — G8918 Other acute postprocedural pain: Secondary | ICD-10-CM | POA: Diagnosis not present

## 2012-05-09 DIAGNOSIS — I2510 Atherosclerotic heart disease of native coronary artery without angina pectoris: Secondary | ICD-10-CM | POA: Diagnosis present

## 2012-05-09 DIAGNOSIS — N400 Enlarged prostate without lower urinary tract symptoms: Secondary | ICD-10-CM | POA: Diagnosis not present

## 2012-05-09 DIAGNOSIS — S8990XA Unspecified injury of unspecified lower leg, initial encounter: Secondary | ICD-10-CM | POA: Diagnosis not present

## 2012-05-09 HISTORY — PX: TOTAL KNEE ARTHROPLASTY: SHX125

## 2012-05-09 SURGERY — ARTHROPLASTY, KNEE, TOTAL
Anesthesia: General | Site: Knee | Laterality: Right | Wound class: Clean

## 2012-05-09 MED ORDER — HYDROMORPHONE HCL PF 1 MG/ML IJ SOLN
0.2500 mg | INTRAMUSCULAR | Status: DC | PRN
  Administered 2012-05-09 (×4): 0.5 mg via INTRAVENOUS

## 2012-05-09 MED ORDER — HYDROMORPHONE HCL PF 1 MG/ML IJ SOLN
INTRAMUSCULAR | Status: AC
  Administered 2012-05-09: 0.5 mg via INTRAVENOUS
  Filled 2012-05-09: qty 1

## 2012-05-09 MED ORDER — MIDAZOLAM HCL 5 MG/5ML IJ SOLN
INTRAMUSCULAR | Status: DC | PRN
  Administered 2012-05-09 (×2): 1 mg via INTRAVENOUS

## 2012-05-09 MED ORDER — METHOCARBAMOL 100 MG/ML IJ SOLN
500.0000 mg | Freq: Four times a day (QID) | INTRAVENOUS | Status: DC | PRN
  Administered 2012-05-09: 500 mg via INTRAVENOUS
  Filled 2012-05-09: qty 5

## 2012-05-09 MED ORDER — ONDANSETRON HCL 4 MG/2ML IJ SOLN
INTRAMUSCULAR | Status: DC | PRN
  Administered 2012-05-09: 4 mg via INTRAVENOUS

## 2012-05-09 MED ORDER — ACETAMINOPHEN 10 MG/ML IV SOLN
1000.0000 mg | Freq: Once | INTRAVENOUS | Status: AC | PRN
  Administered 2012-05-09: 1000 mg via INTRAVENOUS

## 2012-05-09 MED ORDER — LIDOCAINE HCL 4 % MT SOLN
OROMUCOSAL | Status: DC | PRN
  Administered 2012-05-09: 4 mL via TOPICAL

## 2012-05-09 MED ORDER — ONDANSETRON HCL 4 MG PO TABS: 4.0000 mg | ORAL_TABLET | Freq: Four times a day (QID) | ORAL | Status: DC | PRN

## 2012-05-09 MED ORDER — CELECOXIB 200 MG PO CAPS
200.0000 mg | ORAL_CAPSULE | Freq: Two times a day (BID) | ORAL | Status: DC
  Administered 2012-05-09 – 2012-05-12 (×6): 200 mg via ORAL
  Filled 2012-05-09 (×7): qty 1

## 2012-05-09 MED ORDER — SODIUM CHLORIDE 0.9 % IR SOLN
Status: DC | PRN
  Administered 2012-05-09: 3000 mL

## 2012-05-09 MED ORDER — HYDROCHLOROTHIAZIDE 25 MG PO TABS
12.5000 mg | ORAL_TABLET | Freq: Every day | ORAL | Status: DC
  Filled 2012-05-09: qty 0.5

## 2012-05-09 MED ORDER — FENTANYL CITRATE 0.05 MG/ML IJ SOLN
INTRAMUSCULAR | Status: AC
  Filled 2012-05-09: qty 2

## 2012-05-09 MED ORDER — HYDROMORPHONE HCL PF 1 MG/ML IJ SOLN
0.5000 mg | INTRAMUSCULAR | Status: DC | PRN
  Administered 2012-05-09: 0.5 mg via INTRAVENOUS

## 2012-05-09 MED ORDER — CEFUROXIME SODIUM 1.5 G IJ SOLR
INTRAMUSCULAR | Status: AC
  Filled 2012-05-09: qty 1.5

## 2012-05-09 MED ORDER — TAMSULOSIN HCL 0.4 MG PO CAPS
0.4000 mg | ORAL_CAPSULE | Freq: Every day | ORAL | Status: DC
  Administered 2012-05-09 – 2012-05-11 (×3): 0.4 mg via ORAL
  Filled 2012-05-09 (×4): qty 1

## 2012-05-09 MED ORDER — CEFUROXIME SODIUM 1.5 G IJ SOLR
INTRAMUSCULAR | Status: DC | PRN
  Administered 2012-05-09: 1.5 g

## 2012-05-09 MED ORDER — ONDANSETRON HCL 4 MG/2ML IJ SOLN: 4.0000 mg | Freq: Once | INTRAMUSCULAR | Status: DC | PRN

## 2012-05-09 MED ORDER — METOCLOPRAMIDE HCL 10 MG PO TABS: 5.0000 mg | ORAL_TABLET | Freq: Three times a day (TID) | ORAL | Status: DC | PRN

## 2012-05-09 MED ORDER — HYDROMORPHONE HCL PF 1 MG/ML IJ SOLN
1.0000 mg | INTRAMUSCULAR | Status: DC | PRN
  Administered 2012-05-09 – 2012-05-11 (×2): 1 mg via INTRAVENOUS
  Filled 2012-05-09 (×2): qty 1

## 2012-05-09 MED ORDER — METOPROLOL SUCCINATE ER 25 MG PO TB24
25.0000 mg | ORAL_TABLET | Freq: Every day | ORAL | Status: DC
  Administered 2012-05-11 – 2012-05-12 (×2): 25 mg via ORAL
  Filled 2012-05-09 (×3): qty 1

## 2012-05-09 MED ORDER — EPHEDRINE SULFATE 50 MG/ML IJ SOLN
INTRAMUSCULAR | Status: DC | PRN
  Administered 2012-05-09: 5 mg via INTRAVENOUS

## 2012-05-09 MED ORDER — KCL IN DEXTROSE-NACL 20-5-0.45 MEQ/L-%-% IV SOLN
INTRAVENOUS | Status: DC
  Administered 2012-05-09 – 2012-05-10 (×2): via INTRAVENOUS
  Filled 2012-05-09 (×10): qty 1000

## 2012-05-09 MED ORDER — BISACODYL 5 MG PO TBEC: 5.0000 mg | DELAYED_RELEASE_TABLET | Freq: Every day | ORAL | Status: DC | PRN

## 2012-05-09 MED ORDER — DIAZEPAM 5 MG PO TABS: 5.0000 mg | ORAL_TABLET | Freq: Every day | ORAL | Status: DC | PRN

## 2012-05-09 MED ORDER — METOCLOPRAMIDE HCL 5 MG/ML IJ SOLN: 5.0000 mg | Freq: Three times a day (TID) | INTRAMUSCULAR | Status: DC | PRN

## 2012-05-09 MED ORDER — MAGNESIUM HYDROXIDE 400 MG/5ML PO SUSP
30.0000 mL | Freq: Every day | ORAL | Status: DC | PRN
  Filled 2012-05-09: qty 30

## 2012-05-09 MED ORDER — TRIAMCINOLONE ACETONIDE 0.5 % EX CREA
1.0000 "application " | TOPICAL_CREAM | Freq: Every day | CUTANEOUS | Status: DC | PRN
  Filled 2012-05-09: qty 15

## 2012-05-09 MED ORDER — HYDROCHLOROTHIAZIDE 12.5 MG PO CAPS
12.5000 mg | ORAL_CAPSULE | Freq: Every day | ORAL | Status: DC
  Administered 2012-05-09 – 2012-05-12 (×2): 12.5 mg via ORAL
  Filled 2012-05-09 (×4): qty 1

## 2012-05-09 MED ORDER — DEXTROSE-NACL 5-0.45 % IV SOLN: INTRAVENOUS | Status: DC

## 2012-05-09 MED ORDER — MIDAZOLAM HCL 2 MG/2ML IJ SOLN
INTRAMUSCULAR | Status: AC
  Filled 2012-05-09: qty 2

## 2012-05-09 MED ORDER — LOSARTAN POTASSIUM 50 MG PO TABS
100.0000 mg | ORAL_TABLET | Freq: Every day | ORAL | Status: DC
  Administered 2012-05-09 – 2012-05-12 (×4): 100 mg via ORAL
  Filled 2012-05-09 (×4): qty 2

## 2012-05-09 MED ORDER — ONDANSETRON HCL 4 MG/2ML IJ SOLN
4.0000 mg | Freq: Four times a day (QID) | INTRAMUSCULAR | Status: DC | PRN
  Administered 2012-05-09: 4 mg via INTRAVENOUS
  Filled 2012-05-09: qty 2

## 2012-05-09 MED ORDER — LOSARTAN POTASSIUM 50 MG PO TABS
100.0000 mg | ORAL_TABLET | Freq: Every day | ORAL | Status: DC
  Filled 2012-05-09: qty 2

## 2012-05-09 MED ORDER — DEXAMETHASONE SODIUM PHOSPHATE 4 MG/ML IJ SOLN
INTRAMUSCULAR | Status: DC | PRN
  Administered 2012-05-09: 8 mg via INTRAVENOUS

## 2012-05-09 MED ORDER — ACETAMINOPHEN 650 MG RE SUPP: 650.0000 mg | Freq: Four times a day (QID) | RECTAL | Status: DC | PRN

## 2012-05-09 MED ORDER — ASPIRIN EC 325 MG PO TBEC
325.0000 mg | DELAYED_RELEASE_TABLET | Freq: Two times a day (BID) | ORAL | Status: DC
  Administered 2012-05-09 – 2012-05-12 (×6): 325 mg via ORAL
  Filled 2012-05-09 (×7): qty 1

## 2012-05-09 MED ORDER — VITAMIN D3 25 MCG (1000 UNIT) PO TABS
1000.0000 [IU] | ORAL_TABLET | Freq: Every day | ORAL | Status: DC
  Administered 2012-05-09 – 2012-05-12 (×4): 1000 [IU] via ORAL
  Filled 2012-05-09 (×4): qty 1

## 2012-05-09 MED ORDER — GLYCOPYRROLATE 0.2 MG/ML IJ SOLN
INTRAMUSCULAR | Status: DC | PRN
  Administered 2012-05-09: .8 mg via INTRAVENOUS

## 2012-05-09 MED ORDER — FENTANYL CITRATE 0.05 MG/ML IJ SOLN
INTRAMUSCULAR | Status: DC | PRN
  Administered 2012-05-09: 100 ug via INTRAVENOUS
  Administered 2012-05-09: 50 ug via INTRAVENOUS
  Administered 2012-05-09: 100 ug via INTRAVENOUS
  Administered 2012-05-09: 50 ug via INTRAVENOUS

## 2012-05-09 MED ORDER — METHOCARBAMOL 500 MG PO TABS
500.0000 mg | ORAL_TABLET | Freq: Four times a day (QID) | ORAL | Status: DC | PRN
  Administered 2012-05-10 – 2012-05-12 (×4): 500 mg via ORAL
  Filled 2012-05-09 (×6): qty 1

## 2012-05-09 MED ORDER — PHENOL 1.4 % MT LIQD: 1.0000 | OROMUCOSAL | Status: DC | PRN

## 2012-05-09 MED ORDER — NEOSTIGMINE METHYLSULFATE 1 MG/ML IJ SOLN
INTRAMUSCULAR | Status: DC | PRN
  Administered 2012-05-09: 5 mg via INTRAVENOUS

## 2012-05-09 MED ORDER — FLEET ENEMA 7-19 GM/118ML RE ENEM: 1.0000 | ENEMA | Freq: Once | RECTAL | Status: AC | PRN

## 2012-05-09 MED ORDER — DIPHENHYDRAMINE HCL 12.5 MG/5ML PO ELIX: 12.5000 mg | ORAL_SOLUTION | ORAL | Status: DC | PRN

## 2012-05-09 MED ORDER — ACETAMINOPHEN 325 MG PO TABS: 650.0000 mg | ORAL_TABLET | Freq: Four times a day (QID) | ORAL | Status: DC | PRN

## 2012-05-09 MED ORDER — ALUM & MAG HYDROXIDE-SIMETH 200-200-20 MG/5ML PO SUSP: 30.0000 mL | ORAL | Status: DC | PRN

## 2012-05-09 MED ORDER — OXYCODONE HCL 5 MG PO TABS
5.0000 mg | ORAL_TABLET | ORAL | Status: DC | PRN
  Administered 2012-05-10 – 2012-05-11 (×6): 10 mg via ORAL
  Administered 2012-05-11: 5 mg via ORAL
  Administered 2012-05-11 – 2012-05-12 (×2): 10 mg via ORAL
  Filled 2012-05-09 (×5): qty 2
  Filled 2012-05-09: qty 1
  Filled 2012-05-09 (×3): qty 2

## 2012-05-09 MED ORDER — DARIFENACIN HYDROBROMIDE ER 15 MG PO TB24
15.0000 mg | ORAL_TABLET | Freq: Every day | ORAL | Status: DC
  Administered 2012-05-09 – 2012-05-12 (×4): 15 mg via ORAL
  Filled 2012-05-09 (×4): qty 1

## 2012-05-09 MED ORDER — LIDOCAINE HCL (CARDIAC) 20 MG/ML IV SOLN
INTRAVENOUS | Status: DC | PRN
  Administered 2012-05-09: 30 mg via INTRAVENOUS

## 2012-05-09 MED ORDER — ROCURONIUM BROMIDE 100 MG/10ML IV SOLN
INTRAVENOUS | Status: DC | PRN
  Administered 2012-05-09: 50 mg via INTRAVENOUS

## 2012-05-09 MED ORDER — ATORVASTATIN CALCIUM 40 MG PO TABS
40.0000 mg | ORAL_TABLET | Freq: Every day | ORAL | Status: DC
  Administered 2012-05-09 – 2012-05-12 (×4): 40 mg via ORAL
  Filled 2012-05-09 (×4): qty 1

## 2012-05-09 MED ORDER — LACTATED RINGERS IV SOLN
INTRAVENOUS | Status: DC | PRN
  Administered 2012-05-09 (×2): via INTRAVENOUS

## 2012-05-09 MED ORDER — PANTOPRAZOLE SODIUM 40 MG PO TBEC
80.0000 mg | DELAYED_RELEASE_TABLET | Freq: Every day | ORAL | Status: DC
  Administered 2012-05-10 – 2012-05-12 (×3): 80 mg via ORAL
  Filled 2012-05-09 (×3): qty 2

## 2012-05-09 MED ORDER — MENTHOL 3 MG MT LOZG: 1.0000 | LOZENGE | OROMUCOSAL | Status: DC | PRN

## 2012-05-09 MED ORDER — VERAPAMIL HCL ER 240 MG PO TBCR
240.0000 mg | EXTENDED_RELEASE_TABLET | Freq: Every day | ORAL | Status: DC
  Administered 2012-05-09 – 2012-05-11 (×3): 240 mg via ORAL
  Filled 2012-05-09 (×4): qty 1

## 2012-05-09 MED ORDER — ACETAMINOPHEN 10 MG/ML IV SOLN
INTRAVENOUS | Status: AC
  Filled 2012-05-09: qty 100

## 2012-05-09 MED ORDER — ACETAMINOPHEN 10 MG/ML IV SOLN
1000.0000 mg | Freq: Four times a day (QID) | INTRAVENOUS | Status: AC
  Administered 2012-05-09 – 2012-05-10 (×4): 1000 mg via INTRAVENOUS
  Filled 2012-05-09 (×5): qty 100

## 2012-05-09 MED ORDER — NITROGLYCERIN 0.4 MG SL SUBL: 0.4000 mg | SUBLINGUAL_TABLET | SUBLINGUAL | Status: DC | PRN

## 2012-05-09 MED ORDER — PRASUGREL HCL 10 MG PO TABS
10.0000 mg | ORAL_TABLET | Freq: Every day | ORAL | Status: DC
  Administered 2012-05-10 – 2012-05-12 (×3): 10 mg via ORAL
  Filled 2012-05-09 (×3): qty 1

## 2012-05-09 MED ORDER — PROPOFOL 10 MG/ML IV BOLUS
INTRAVENOUS | Status: DC | PRN
  Administered 2012-05-09 (×2): 100 mg via INTRAVENOUS

## 2012-05-09 SURGICAL SUPPLY — 59 items
BANDAGE ESMARK 6X9 LF (GAUZE/BANDAGES/DRESSINGS) ×1 IMPLANT
BLADE SAG 18X100X1.27 (BLADE) ×2 IMPLANT
BLADE SAW SGTL 13X75X1.27 (BLADE) ×2 IMPLANT
BLADE SURG ROTATE 9660 (MISCELLANEOUS) IMPLANT
BNDG CMPR 9X6 STRL LF SNTH (GAUZE/BANDAGES/DRESSINGS) ×1
BNDG CMPR MED 10X6 ELC LF (GAUZE/BANDAGES/DRESSINGS) ×1
BNDG ELASTIC 6X10 VLCR STRL LF (GAUZE/BANDAGES/DRESSINGS) ×2 IMPLANT
BNDG ESMARK 6X9 LF (GAUZE/BANDAGES/DRESSINGS) ×2
BOWL SMART MIX CTS (DISPOSABLE) ×2 IMPLANT
CEMENT HV SMART SET (Cement) ×4 IMPLANT
CLOTH BEACON ORANGE TIMEOUT ST (SAFETY) ×2 IMPLANT
COVER BACK TABLE 24X17X13 BIG (DRAPES) IMPLANT
COVER SURGICAL LIGHT HANDLE (MISCELLANEOUS) ×2 IMPLANT
CUFF TOURNIQUET SINGLE 34IN LL (TOURNIQUET CUFF) ×1 IMPLANT
CUFF TOURNIQUET SINGLE 44IN (TOURNIQUET CUFF) IMPLANT
DRAPE EXTREMITY T 121X128X90 (DRAPE) ×2 IMPLANT
DRAPE U-SHAPE 47X51 STRL (DRAPES) ×2 IMPLANT
DURAPREP 26ML APPLICATOR (WOUND CARE) ×2 IMPLANT
ELECT REM PT RETURN 9FT ADLT (ELECTROSURGICAL) ×2
ELECTRODE REM PT RTRN 9FT ADLT (ELECTROSURGICAL) ×1 IMPLANT
EVACUATOR 1/8 PVC DRAIN (DRAIN) ×2 IMPLANT
GAUZE XEROFORM 1X8 LF (GAUZE/BANDAGES/DRESSINGS) ×2 IMPLANT
GLOVE BIO SURGEON STRL SZ 6.5 (GLOVE) ×1 IMPLANT
GLOVE BIO SURGEON STRL SZ7 (GLOVE) ×3 IMPLANT
GLOVE BIO SURGEON STRL SZ7.5 (GLOVE) ×3 IMPLANT
GLOVE BIOGEL PI IND STRL 7.0 (GLOVE) ×1 IMPLANT
GLOVE BIOGEL PI IND STRL 7.5 (GLOVE) IMPLANT
GLOVE BIOGEL PI IND STRL 8 (GLOVE) ×1 IMPLANT
GLOVE BIOGEL PI INDICATOR 7.0 (GLOVE) ×2
GLOVE BIOGEL PI INDICATOR 7.5 (GLOVE) ×1
GLOVE BIOGEL PI INDICATOR 8 (GLOVE) ×1
GLOVE SURG SS PI 6.5 STRL IVOR (GLOVE) ×1 IMPLANT
GOWN PREVENTION PLUS XLARGE (GOWN DISPOSABLE) ×3 IMPLANT
GOWN SRG XL XLNG 56XLVL 4 (GOWN DISPOSABLE) IMPLANT
GOWN STRL NON-REIN LRG LVL3 (GOWN DISPOSABLE) ×3 IMPLANT
GOWN STRL NON-REIN XL XLG LVL4 (GOWN DISPOSABLE) ×2
HANDPIECE INTERPULSE COAX TIP (DISPOSABLE) ×2
HOOD PEEL AWAY FACE SHEILD DIS (HOOD) ×5 IMPLANT
KIT BASIN OR (CUSTOM PROCEDURE TRAY) ×2 IMPLANT
KIT ROOM TURNOVER OR (KITS) ×2 IMPLANT
MANIFOLD NEPTUNE II (INSTRUMENTS) ×2 IMPLANT
NS IRRIG 1000ML POUR BTL (IV SOLUTION) ×2 IMPLANT
PACK TOTAL JOINT (CUSTOM PROCEDURE TRAY) ×2 IMPLANT
PAD ARMBOARD 7.5X6 YLW CONV (MISCELLANEOUS) ×3 IMPLANT
PADDING CAST COTTON 6X4 STRL (CAST SUPPLIES) ×2 IMPLANT
SET HNDPC FAN SPRY TIP SCT (DISPOSABLE) ×1 IMPLANT
SPONGE GAUZE 4X4 12PLY (GAUZE/BANDAGES/DRESSINGS) ×3 IMPLANT
STAPLER VISISTAT 35W (STAPLE) ×2 IMPLANT
SUCTION FRAZIER TIP 10 FR DISP (SUCTIONS) ×2 IMPLANT
SUT VIC AB 0 CTX 36 (SUTURE) ×2
SUT VIC AB 0 CTX36XBRD ANTBCTR (SUTURE) ×1 IMPLANT
SUT VIC AB 1 CTX 36 (SUTURE) ×4
SUT VIC AB 1 CTX36XBRD ANBCTR (SUTURE) ×1 IMPLANT
SUT VIC AB 2-0 CT1 27 (SUTURE) ×2
SUT VIC AB 2-0 CT1 TAPERPNT 27 (SUTURE) ×1 IMPLANT
TOWEL OR 17X24 6PK STRL BLUE (TOWEL DISPOSABLE) ×2 IMPLANT
TOWEL OR 17X26 10 PK STRL BLUE (TOWEL DISPOSABLE) ×2 IMPLANT
TRAY FOLEY CATH 14FR (SET/KITS/TRAYS/PACK) ×1 IMPLANT
WATER STERILE IRR 1000ML POUR (IV SOLUTION) ×4 IMPLANT

## 2012-05-09 NOTE — Progress Notes (Signed)
Orthopedic Tech Progress Note Patient Details:  Jerry [name] Oct 10, 1929 [id number]  CPM Right Knee CPM Right Knee: On Right Knee Flexion (Degrees): 60  Right Knee Extension (Degrees): 0  Additional Comments: trapeze bar patient helper   Nikki Dom 05/09/2012, 4:45 PM

## 2012-05-09 NOTE — Anesthesia Procedure Notes (Signed)
Anesthesia Regional Block:  Femoral nerve block  Pre-Anesthetic Checklist: ,, timeout performed, Correct Patient, Correct Site, Correct Laterality, Correct Procedure, Correct Position, site marked, Risks and benefits discussed,  Surgical consent,  Pre-op evaluation,  At surgeon's request and post-op pain management  Laterality: Right  Prep: chloraprep       Needles:  Injection technique: Single-shot  Needle Type: Echogenic Stimulator Needle     Needle Length:cm 9 cm Needle Gauge: 22 and 22 G    Additional Needles:  Procedures: ultrasound guided (picture in chart) and nerve stimulator Femoral nerve block Narrative:  Start time: 05/09/2012 1:00 PM End time: 05/09/2012 1:10 PM Injection made incrementally with aspirations every 5 mL.  Performed by: Personally   Additional Notes: 30 cc 0.5% marcaine with 1:200 Epi injected easily  Kipp Brood, MD

## 2012-05-09 NOTE — Progress Notes (Signed)
Requested info again from [facility]

## 2012-05-09 NOTE — Anesthesia Preprocedure Evaluation (Addendum)
Anesthesia Evaluation  Patient identified by MRN, date of birth, ID band Patient awake    Reviewed: Allergy & Precautions, H&P , NPO status , Patient's Chart, lab work & pertinent test results  Airway Mallampati: II      Dental  (+) Teeth Intact and Dental Advisory Given   Pulmonary shortness of breath and with exertion,  breath sounds clear to auscultation        Cardiovascular hypertension, Pt. on medications and Pt. on home beta blockers + angina + Past MI Rhythm:Regular Rate:Normal     Neuro/Psych    GI/Hepatic Neg liver ROS, GERD-  ,  Endo/Other  negative endocrine ROS  Renal/GU negative Renal ROS     Musculoskeletal   Abdominal (+) + obese,   Peds  Hematology negative hematology ROS (+)   Anesthesia Other Findings   Reproductive/Obstetrics                          Anesthesia Physical Anesthesia Plan  ASA: III  Anesthesia Plan: General   Post-op Pain Management:    Induction: Intravenous  Airway Management Planned: Oral ETT  Additional Equipment:   Intra-op Plan:   Post-operative Plan: Extubation in OR  Informed Consent:   Dental advisory given  Plan Discussed with: CRNA and Surgeon  Anesthesia Plan Comments: (DJD R.Knee Obesity Htn CAD S/P PTCAs last myoview 04/10/12 low risk  Plan GA with oral ETT and FNB  Kipp Brood, MD)        Anesthesia Quick Evaluation

## 2012-05-09 NOTE — Preoperative (Signed)
Beta Blockers   Reason not to administer Beta Blockers:Not Applicable, Took at 0800

## 2012-05-09 NOTE — Progress Notes (Signed)
Orthopedic Tech Progress Note Patient Details:  Jerry [name] 04-Jul-1929 [id number]  Patient ID: Jerry [name], male   DOB: [birth year], 78 y.o.   MRN: [id number] Viewed order from rn order list  Nikki Dom 05/09/2012, 4:45 PM

## 2012-05-09 NOTE — Interval H&P Note (Signed)
History and Physical Interval Note:  05/09/2012 12:57 PM  Jerry [name]  has presented today for surgery, with the diagnosis of OSTEOARTHRITIS RIGHT KNEE  The various methods of treatment have been discussed with the patient and family. After consideration of risks, benefits and other options for treatment, the patient has consented to  Procedure(s) (LRB) with comments: TOTAL KNEE ARTHROPLASTY (Right) as a surgical intervention .  The patient's history has been reviewed, patient examined, no change in status, stable for surgery.  I have reviewed the patient's chart and labs.  Questions were answered to the patient's satisfaction.     Nestor Lewandowsky

## 2012-05-09 NOTE — Op Note (Signed)
PATIENT ID:      Jerry [name]  MRN:     [id number] DOB/AGE:    1930-05-13 / 78 y.o.       OPERATIVE REPORT    DATE OF PROCEDURE:  05/09/2012       PREOPERATIVE DIAGNOSIS:   OSTEOARTHRITIS RIGHT KNEE      There is no height or weight on file to calculate BMI.                                                        POSTOPERATIVE DIAGNOSIS:   OSTEOARTHRITIS RIGHT KNEE                                                                      PROCEDURE:  Procedure(s): TOTAL KNEE ARTHROPLASTY Using Depuy Sigma RP implants #4R Femur, #5Tibia, 10mm sigma RP bearing, 38 Patella     SURGEON: Sydne Krahl J    ASSISTANT:   Shirl Harris PA-C   (Present and scrubbed throughout the case, critical for assistance with exposure, retraction, instrumentation, and closure.)         ANESTHESIA: GET with Femoral Nerve Block  DRAINS: foley, 2 medium hemovac in knee   TOURNIQUET TIME:   COMPLICATIONS:  None     SPECIMENS: None   INDICATIONS FOR PROCEDURE: The patient has  OSTEOARTHRITIS RIGHT KNEE, varus deformities, XR shows bone on bone arthritis. Patient has failed all conservative measures including anti-inflammatory medicines, narcotics, attempts at  exercise and weight loss, cortisone injections and viscosupplementation.  Risks and benefits of surgery have been discussed, questions answered.   DESCRIPTION OF PROCEDURE: The patient identified by armband, received  right femoral nerve block and IV antibiotics, in the holding area at [facility]. Patient taken to the operating room, appropriate anesthetic  monitors were attached General endotracheal anesthesia induced with  the patient in supine position, Foley catheter was inserted. Tourniquet  applied high to the operative thigh. Lateral post and foot positioner  applied to the table, the lower extremity was then prepped and draped  in usual sterile fashion from the ankle to the tourniquet. Time-out procedure was performed. The limb  was wrapped with an Esmarch bandage and the tourniquet inflated to 350 mmHg. We began the operation by making the anterior midline incision starting at handbreadth above the patella going over the patella 1 cm medial to and  4 cm distal to the tibial tubercle. Small bleeders in the skin and the  subcutaneous tissue identified and cauterized. Transverse retinaculum was incised and reflected medially and a medial parapatellar arthrotomy was accomplished. the patella was everted and theprepatellar fat pad resected. The superficial medial collateral  ligament was then elevated from anterior to posterior along the proximal  flare of the tibia and anterior half of the menisci resected. The knee was hyperflexed exposing bone on bone arthritis. Peripheral and notch osteophytes as well as the cruciate ligaments were then resected. We continued to  work our way around posteriorly along the proximal tibia, and externally  rotated the tibia subluxing it out from underneath the femur.  A McHale  retractor was placed through the notch and a lateral Hohmann retractor  placed, and we then drilled through the proximal tibia in line with the  axis of the tibia followed by an intramedullary guide rod and 2-degree  posterior slope cutting guide. The tibial cutting guide was pinned into place  allowing resection of 4 mm of bone medially and about 9 mm of bone  laterally because of her varus deformity. Satisfied with the tibial resection, we then  entered the distal femur 2 mm anterior to the PCL origin with the  intramedullary guide rod and applied the distal femoral cutting guide  set at 11mm, with 5 degrees of valgus. This was pinned along the  epicondylar axis. At this point, the distal femoral cut was accomplished without difficulty. We then sized for a #4R femoral component and pinned the guide in 3 degrees of external rotation.The chamfer cutting guide was pinned into place. The anterior, posterior, and chamfer cuts  were accomplished without difficulty followed by  the Sigma RP box cutting guide and the box cut. We also removed posterior osteophytes from the posterior femoral condyles. At this  time, the knee was brought into full extension. We checked our  extension and flexion gaps and found them symmetric at 10mm.  The patella thickness measured at 26 mm. We set the cutting guide at 15 and removed the posterior 11 mm  of the patella sized for 38 button and drilled the lollipop. The knee  was then once again hyperflexed exposing the proximal tibia. We sized for a #5 tibial base plate, applied the smokestack and the conical reamer followed by the the Delta fin keel punch. We then hammered into place the Sigma RP trial femoral component, inserted a 10-mm trial bearing, trial patellar button, and took the knee through range of motion from 0-130 degrees. No thumb pressure was required for patellar  tracking. At this point, all trial components were removed, a double batch of DePuy HV cement with 1500 mg of Zinacef was mixed and applied to all bony metallic mating surfaces except for the posterior condyles of the femur itself. In order, we  hammered into place the tibial tray and removed excess cement, the femoral component and removed excess cement, a 10-mm Sigma RP bearing  was inserted, and the knee brought to full extension with compression.  The patellar button was clamped into place, and excess cement  removed. While the cement cured the wound was irrigated out with normal saline solution pulse lavage, and medium Hemovac drains were placed from an anterolateral  approach. Ligament stability and patellar tracking were checked and found to be excellent. The parapatellar arthrotomy was closed with  running #1 Vicryl suture. The subcutaneous tissue with 0 and 2-0 undyed  Vicryl suture, and the skin with skin staples. A dressing of Xeroform,  4 x 4, dressing sponges, Webril, and Ace wrap applied. The patient   awakened, extubated, and taken to recovery room without difficulty.   Nestor Lewandowsky 05/09/2012, 3:33 PM

## 2012-05-09 NOTE — Anesthesia Postprocedure Evaluation (Signed)
  Anesthesia Post-op Note  Patient: Jerry [name]  Procedure(s) Performed: Procedure(s) (LRB) with comments: TOTAL KNEE ARTHROPLASTY (Right)  Patient Location: PACU  Anesthesia Type:General  Level of Consciousness: awake, alert  and oriented  Airway and Oxygen Therapy: Patient Spontanous Breathing and Patient connected to nasal cannula oxygen  Post-op Pain: mild  Post-op Assessment: Post-op Vital signs reviewed and Patient's Cardiovascular Status Stable  Post-op Vital Signs: stable  Complications: No apparent anesthesia complications

## 2012-05-09 NOTE — Transfer of Care (Signed)
Immediate Anesthesia Transfer of Care Note  Patient: Jerry [name]  Procedure(s) Performed: Procedure(s) (LRB) with comments: TOTAL KNEE ARTHROPLASTY (Right)  Patient Location: PACU  Anesthesia Type:General and GA combined with regional for post-op pain  Level of Consciousness: awake  Airway & Oxygen Therapy: Patient Spontanous Breathing and Patient connected to face mask oxygen  Post-op Assessment: Report given to PACU RN and Post -op Vital signs reviewed and stable  Post vital signs: Reviewed and stable  Complications: No apparent anesthesia complications

## 2012-05-10 ENCOUNTER — Encounter (HOSPITAL_COMMUNITY): Payer: Self-pay | Admitting: Orthopedic Surgery

## 2012-05-10 LAB — CBC
Hemoglobin: 11.7 g/dL — ABNORMAL LOW (ref 13.0–17.0)
MCH: 32 pg (ref 26.0–34.0)
MCV: 91.5 fL (ref 78.0–100.0)
RBC: 3.66 MIL/uL — ABNORMAL LOW (ref 4.22–5.81)
WBC: 13.5 10*3/uL — ABNORMAL HIGH (ref 4.0–10.5)

## 2012-05-10 LAB — BASIC METABOLIC PANEL
CO2: 26 mEq/L (ref 19–32)
Calcium: 8.5 mg/dL (ref 8.4–10.5)
Chloride: 102 mEq/L (ref 96–112)
Creatinine, Ser: 0.84 mg/dL (ref 0.50–1.35)
Glucose, Bld: 172 mg/dL — ABNORMAL HIGH (ref 70–99)

## 2012-05-10 NOTE — Clinical Social Work Psychosocial (Signed)
Clinical Social Worker received referral for the potential need for post acute placement at a SNF. CSW reviewed chart and met with patient at bedside. CSW introduced self, explained role, and provided emotional support. CSW discussed skilled nursing home placement, reviewed the process of placement and answered all the patient's questions. After reviewing the SNF list, Patient reported that he is agreeable to CSW seeking SNF placement. Patient reported that he toured [facility] and this is his first choice.  CSW encouraged patient to ask questions as needed of CSW and medical staff. CSW will begin the placement process and will continue to follow and assist with all d/c planning.  Patient was very appreciative of support and information provided by CSW. CSW will continue to follow and will assist with all d/c planning needs  Sabino Niemann, MSW [phone]

## 2012-05-10 NOTE — Progress Notes (Signed)
UR COMPLETED  

## 2012-05-10 NOTE — Progress Notes (Signed)
Patient ID: Jerry [name], male   DOB: 09/30/1929, 78 y.o.   MRN: [id number] PATIENT ID: Jerry [name]  MRN: [id number]  DOB/AGE:  Oct 30, 1929 / 78 y.o.  1 Day Post-Op Procedure(s) (LRB): TOTAL KNEE ARTHROPLASTY (Right)    PROGRESS NOTE Subjective: Patient is alert, oriented, 1+ Nausea,  1+Vomiting, yes passing gas, no Bowel Movement. Taking PO sips. Denies SOB, Chest or Calf Pain. Using Incentive Spirometer, PAS in place. Ambulate WBAT, CPM 0-40 Patient reports pain as 3 on 0-10 scale  .    Objective: Vital signs in last 24 hours: Filed Vitals:   05/09/12 1832 05/09/12 2103 05/10/12 0200 05/10/12 0600  BP: 137/70 148/91 130/80 125/77  Pulse: 81 86 95 88  Temp: 98.3 F (36.8 C) 99.6 F (37.6 C) 98.1 F (36.7 C) 98 F (36.7 C)  TempSrc:      Resp: 16 16 18 18   SpO2: 92% 95% 97% 97%      Intake/Output from previous day: I/O last 3 completed shifts: In: 3500 [P.O.:600; I.V.:2900] Out: 1505 [Urine:880; Drains:575; Blood:50]   Intake/Output this shift:     LABORATORY DATA:  Basename 05/10/12 0540  WBC 13.5*  HGB 11.7*  HCT 33.5*  PLT 157  NA 138  K 4.0  CL 102  CO2 26  BUN 21  CREATININE 0.84  GLUCOSE 172*  GLUCAP --  INR --  CALCIUM 8.5    Examination: Neurologically intact ABD soft Neurovascular intact Sensation intact distally Intact pulses distally Dorsiflexion/Plantar flexion intact Incision: scant drainage No cellulitis present Compartment soft}  Assessment:   1 Day Post-Op Procedure(s) (LRB): TOTAL KNEE ARTHROPLASTY (Right) ADDITIONAL DIAGNOSIS:  Hypertension  Plan: PT/OT WBAT, CPM 5/hrs day until ROM 0-90 degrees, then D/C CPM DVT Prophylaxis:  SCDx72hrs, ASA 325 mg BID x 2 weeks DISCHARGE PLAN: Skilled Nursing Facility/Rehab DISCHARGE NEEDS: HHPT, HHRN, CPM, Walker and 3-in-1 comode seat     Holt Woolbright J 05/10/2012, 8:02 AM

## 2012-05-10 NOTE — Progress Notes (Signed)
Physical Therapy Treatment Patient Details Name: Jerry [name] MRN: [id number] DOB: 01-12-1930 Today's Date: 05/10/2012 Time: 7846-9629 PT Time Calculation (min): 39 min  PT Assessment / Plan / Recommendation Comments on Treatment Session  Pt admitted s/p right TKA and continues to progress with therapy. Pt able to increase ambulation distance as well as independence. Motivated.    Follow Up Recommendations  SNF     Does the patient have the potential to tolerate intense rehabilitation     Barriers to Discharge        Equipment Recommendations  Rolling walker with 5" wheels    Recommendations for Other Services    Frequency 7X/week   Plan Discharge plan remains appropriate;Frequency remains appropriate    Precautions / Restrictions Precautions Precautions: Knee Precaution Booklet Issued: No Restrictions Weight Bearing Restrictions: Yes RLE Weight Bearing: Weight bearing as tolerated   Pertinent Vitals/Pain 3/10 in right knee. Pt repositioned.    Mobility  Bed Mobility Bed Mobility: Supine to Sit;Sit to Supine Supine to Sit: 4: Min assist;HOB flat Sit to Supine: 1: +2 Total assist;HOB flat Sit to Supine: Patient Percentage: 70% Details for Bed Mobility Assistance: Assist for right LE and trunk to slow descent to chair. Cues for safest sequence. Transfers Transfers: Sit to Stand;Stand to Sit (2 trials.) Sit to Stand: 1: +2 Total assist;With upper extremity assist;From bed;From chair/3-in-1 Sit to Stand: Patient Percentage: 70% Stand to Sit: 1: +2 Total assist;With upper extremity assist;To chair/3-in-1;To bed Stand to Sit: Patient Percentage: 60% Details for Transfer Assistance: Assist for trunk to translate anterior and slow descent to surface. Cues for safest hand/right LE placement. Ambulation/Gait Ambulation/Gait Assistance: 1: +2 Total assist Ambulation/Gait: Patient Percentage: 60% Ambulation Distance (Feet): 25 Feet Assistive device: Rolling  walker Ambulation/Gait Assistance Details: Assist for balance and to off weight right LE due to pain as well as buckling during stance phase. Cues for safety with RW as well as max cues for sequence. Gait Pattern: Step-to pattern;Decreased step length - right;Decreased stance time - right;Right flexed knee in stance;Antalgic;Trunk flexed Stairs: No Wheelchair Mobility Wheelchair Mobility: No    Exercises     PT Diagnosis:    PT Problem List:   PT Treatment Interventions:     PT Goals Acute Rehab PT Goals PT Goal Formulation: With patient Time For Goal Achievement: 05/24/12 Potential to Achieve Goals: Good PT Goal: Supine/Side to Sit - Progress: Progressing toward goal PT Goal: Sit to Supine/Side - Progress: Progressing toward goal PT Goal: Sit to Stand - Progress: Progressing toward goal PT Goal: Stand to Sit - Progress: Progressing toward goal PT Goal: Ambulate - Progress: Progressing toward goal  Visit Information  Last PT Received On: 05/10/12 Assistance Needed: +2 PT/OT Co-Evaluation/Treatment: Yes (Partial)    Subjective Data  Subjective: "I am ready to do whatever you think is best!" Patient Stated Goal: Go to [facility] for a little bit.   Cognition  Overall Cognitive Status: Appears within functional limits for tasks assessed/performed Arousal/Alertness: Awake/alert Orientation Level: Appears intact for tasks assessed Behavior During Session: [facility] for tasks performed    Balance  Balance Balance Assessed: No  End of Session PT - End of Session Equipment Utilized During Treatment: Gait belt Activity Tolerance: Patient tolerated treatment well Patient left: in bed;in CPM;with call bell/phone within reach;with family/visitor present (CPM 0-70 degrees.) Nurse Communication: Mobility status   GP     Cephus Shelling 05/10/2012, 2:32 PM  05/10/2012 Cephus Shelling, PT, DPT [phone]

## 2012-05-10 NOTE — Evaluation (Signed)
Occupational Therapy Evaluation Patient Details Name: Jerry [name] MRN: [id number] DOB: 1929-10-29 Today's Date: 05/10/2012 Time: 4098-1191 OT Time Calculation (min): 32 min  OT Assessment / Plan / Recommendation Clinical Impression  78 yo male s/p Rt TKa that coudl benefit from skilled OT acutely. Recommend SNF ([facility]) for d/c planning    OT Assessment  Patient needs continued OT Services    Follow Up Recommendations  SNF    Barriers to Discharge      Equipment Recommendations  Rolling walker with 5" wheels;3 in 1 bedside comode    Recommendations for Other Services    Frequency  Min 2X/week    Precautions / Restrictions Precautions Precautions: Knee Precaution Booklet Issued: No Restrictions Weight Bearing Restrictions: Yes RLE Weight Bearing: Weight bearing as tolerated   Pertinent Vitals/Pain Discomfort with standing at the calf Discomfort initially in CPM at the scrotum but relieved with wash cloth    ADL  Lower Body Dressing: Minimal assistance (pt able to touch toes so pt will be able to loop underwear) Where Assessed - Lower Body Dressing: Supported sit to stand Toilet Transfer: Minimal assistance Toilet Transfer Method: Sit to stand Toilet Transfer Equipment: Raised toilet seat with arms (or 3-in-1 over toilet) Toileting - Clothing Manipulation and Hygiene:  (uses a toilet aide) Equipment Used: Rolling walker;Gait belt (declines to wear TLSO) Transfers/Ambulation Related to ADLs: Pt ambulating with mod v/c. Pt verbalizing out loud sequence to RW. Pt is very particular and has a specific sequence. Pt is having trouble recalling the sequence due to the number of words used to state the sequence out loud however insist on completing this method. pt does not respond well to change of information that has been established  ADL Comments: Pt sitting EOB with PT on arrival. Pt with unique IV taping due to skin integrity with adhesive. Pt agreeable to ambulation. Pt  with poor demonstration of RW safety and sequence though is able to verbalize out loud. Pt progressing slowly. pt is able to touch ffeet and educate don dressing operated side first with adls. pt positioned in the bed in the CPM at the end of session. Wife present throughout session.    OT Diagnosis: Generalized weakness;Acute pain  OT Problem List: Decreased strength;Decreased activity tolerance;Impaired balance (sitting and/or standing);Decreased safety awareness;Decreased knowledge of use of DME or AE;Decreased knowledge of precautions;Pain OT Treatment Interventions: Self-care/ADL training;DME and/or AE instruction;Therapeutic activities;Patient/family education;Balance training   OT Goals Acute Rehab OT Goals OT Goal Formulation: With patient Time For Goal Achievement: 05/24/12 Potential to Achieve Goals: Good ADL Goals Pt Will Perform Grooming: with modified independence;Standing at sink ADL Goal: Grooming - Progress: Goal set today Pt Will Perform Lower Body Bathing: with modified independence;Sit to stand from chair ADL Goal: Lower Body Bathing - Progress: Goal set today Pt Will Perform Lower Body Dressing: with modified independence;Sit to stand from chair ADL Goal: Lower Body Dressing - Progress: Goal set today Pt Will Transfer to Toilet: with set-up;Ambulation;3-in-1 ADL Goal: Toilet Transfer - Progress: Goal set today Miscellaneous OT Goals Miscellaneous OT Goal #1: Pt will complete basic transfer supervision level as precursor to adls OT Goal: Miscellaneous Goal #1 - Progress: Goal set today  Visit Information  Last OT Received On: 05/10/12 Assistance Needed: +2    Subjective Data  Subjective: "We" - "its not we, can't you see how that is funny because I am donig it"- pt is an english major and pastor Patient Stated Goal: to go to [facility]  Prior Functioning     Home Living Lives With: Spouse Available Help at Discharge: Family;Available  PRN/intermittently Type of Home: House Home Access: Stairs to enter [employer] of Steps: 2 Entrance Stairs-Rails: None Home Layout: Two level Alternate Level Stairs-Number of Steps: 12 Alternate Level Stairs-Rails: Right Home Adaptive Equipment: Straight cane Prior Function Level of Independence: Independent with assistive device(s) Able to Take Stairs?: Yes Driving: Yes Vocation: Retired Musician: No difficulties Dominant Hand: Right         Vision/Perception     Cognition  Overall Cognitive Status: Appears within functional limits for tasks assessed/performed Arousal/Alertness: Awake/alert Orientation Level: Appears intact for tasks assessed Behavior During Session: [facility] for tasks performed    Extremity/Trunk Assessment Right Upper Extremity Assessment RUE ROM/Strength/Tone: Within functional levels RUE Sensation: WFL - Light Touch RUE Coordination: WFL - gross motor Left Upper Extremity Assessment LUE ROM/Strength/Tone: Within functional levels LUE Sensation: WFL - Light Touch LUE Coordination: WFL - gross motor Trunk Assessment Trunk Assessment: Normal     Mobility Bed Mobility Bed Mobility: Supine to Sit;Sit to Supine Supine to Sit: 4: Min assist;HOB flat Sit to Supine: 1: +2 Total assist;HOB flat Sit to Supine: Patient Percentage: 70% Details for Bed Mobility Assistance: Assist for right LE and trunk to slow descent to chair. Cues for safest sequence. Transfers Sit to Stand: 1: +2 Total assist;With upper extremity assist;From bed;From chair/3-in-1 Sit to Stand: Patient Percentage: 70% Stand to Sit: 1: +2 Total assist;With upper extremity assist;To chair/3-in-1;To bed Stand to Sit: Patient Percentage: 60% Details for Transfer Assistance: Assist for trunk to translate anterior and slow descent to surface. Cues for safest hand/right LE placement.     Shoulder Instructions     Exercise     Balance Balance Balance Assessed:  No   End of Session OT - End of Session Activity Tolerance: Patient tolerated treatment well Patient left: with call bell/phone within reach;with family/visitor present;in bed Nurse Communication: Mobility status;Precautions  GO     Lucile Shutters 05/10/2012, 3:46 PM Pager: [phone]

## 2012-05-10 NOTE — Progress Notes (Signed)
CARE MANAGEMENT NOTE 05/10/2012  Patient:  Jerry [name], Jerry [name]   Account Number:  [id number]  Date Initiated:  05/10/2012  Documentation initiated by:  Vance Peper  Subjective/Objective Assessment:   78 yr old male s/p right total knee arthoplasty.     Action/Plan:   patient states he will be going to [facility] for shortterm rehab, states he arranged this prior to admission.. Will inform Child psychotherapist.   Anticipated DC Date:  05/11/2012   Anticipated DC Plan:  SKILLED NURSING FACILITY  In-house referral  Clinical Social Worker      DC Planning Services  CM consult      [facility] Choice  NA   Choice offered to / List presented to:             Status of service:  Completed, signed off Medicare Important Message given?   (If response is "NO", the following Medicare IM given date fields will be blank) Date Medicare IM given:   Date Additional Medicare IM given:    Discharge Disposition:  SKILLED NURSING FACILITY  Per UR Regulation:    If discussed at Long Length of Stay Meetings, dates discussed:    Comments:

## 2012-05-10 NOTE — Evaluation (Signed)
Physical Therapy Evaluation Patient Details Name: Jerry [name] MRN: [id number] DOB: [birth year] Today's Date: 05/10/2012 Time: 4098-1191 PT Time Calculation (min): 32 min  PT Assessment / Plan / Recommendation Clinical Impression  Pt is an 78 y/o male admitted s/p right TKA along with the below PT problem list. Pt would benefit from acute PT to maximize independence and facilitate d/c to [facility]NF. Pt is requesting [facility] at d/c.    PT Assessment  Patient needs continued PT services    Follow Up Recommendations  SNF    Does the patient have the potential to tolerate intense rehabilitation      Barriers to Discharge None      Equipment Recommendations  Rolling walker with 5" wheels    Recommendations for Other Services     Frequency 7X/week    Precautions / Restrictions Precautions Precautions: Knee Precaution Booklet Issued: No Restrictions Weight Bearing Restrictions: Yes RLE Weight Bearing: Weight bearing as tolerated   Pertinent Vitals/Pain 6/10 in right knee. Pt repositioned.      Mobility  Bed Mobility Bed Mobility: Supine to Sit Supine to Sit: 1: +2 Total assist;HOB flat;With rails Supine to Sit: Patient Percentage: 50% Details for Bed Mobility Assistance: Assist for right LE due to weakness and pain as well as trunk to translate anterior. Cues for sequence and safety. Transfers Transfers: Sit to Stand;Stand to Sit Sit to Stand: 1: +2 Total assist;With upper extremity assist;From bed Sit to Stand: Patient Percentage: 50% Stand to Sit: 1: +2 Total assist;With upper extremity assist;To chair/3-in-1 Stand to Sit: Patient Percentage: 50% Details for Transfer Assistance: Assist to translate trunk anterior over BOS as well as for balance while off weighting right LE due to pain and weakness. Cues for safest hand/right LE placement. Ambulation/Gait Ambulation/Gait Assistance: 1: +2 Total assist Ambulation/Gait: Patient Percentage: 60% Ambulation Distance  (Feet): 10 Feet Assistive device: Rolling walker Ambulation/Gait Assistance Details: Assist for balance and to off weight right LE due to pain and weakness with buckling of right knee during stance phase. Cues for safe sequence. Gait Pattern: Step-to pattern;Decreased step length - right;Decreased stance time - right;Right flexed knee in stance;Antalgic;Trunk flexed Stairs: No Wheelchair Mobility Wheelchair Mobility: No    Shoulder Instructions     Exercises Total Joint Exercises Ankle Circles/Pumps: AROM;Right;10 reps;Supine Quad Sets: AROM;Right;10 reps;Supine Heel Slides: AAROM;Right;10 reps;Supine   PT Diagnosis: Difficulty walking;Acute pain  PT Problem List: Decreased strength;Decreased activity tolerance;Decreased range of motion;Decreased balance;Decreased mobility;Decreased knowledge of use of DME;Decreased knowledge of precautions;Pain PT Treatment Interventions: DME instruction;Gait training;Functional mobility training;Therapeutic activities;Therapeutic exercise;Balance training;Patient/family education   PT Goals Acute Rehab PT Goals PT Goal Formulation: With patient Time For Goal Achievement: 05/24/12 Potential to Achieve Goals: Good Pt will go Supine/Side to Sit: with supervision PT Goal: Supine/Side to Sit - Progress: Goal set today Pt will go Sit to Supine/Side: with supervision PT Goal: Sit to Supine/Side - Progress: Goal set today Pt will go Sit to Stand: with min assist PT Goal: Sit to Stand - Progress: Goal set today Pt will go Stand to Sit: with min assist PT Goal: Stand to Sit - Progress: Goal set today Pt will Ambulate: 51 - 150 feet;with supervision;with least restrictive assistive device PT Goal: Ambulate - Progress: Goal set today Pt will Perform Home Exercise Program: with supervision, verbal cues required/provided PT Goal: Perform Home Exercise Program - Progress: Goal set today  Visit Information  Last PT Received On: 05/10/12 Assistance Needed:  +2    Subjective Data  Subjective: "  I think I've had pain medicine recently." Patient Stated Goal: Go to [facility] for a [facility] bit.   Prior Functioning  Home Living Lives With: Spouse Available Help at Discharge: Family;Available PRN/intermittently Type of Home: House Home Access: Stairs to enter [employer] of Steps: 2 Entrance Stairs-Rails: None Home Layout: Two level Alternate Level Stairs-Number of Steps: 12 Alternate Level Stairs-Rails: Right Home Adaptive Equipment: Straight cane Prior Function Level of Independence: Independent with assistive device(s) (Used cane.) Able to Take Stairs?: Yes Driving: Yes Vocation: Retired Musician: No difficulties Dominant Hand: Right    Cognition  Overall Cognitive Status: Appears within functional limits for tasks assessed/performed Arousal/Alertness: Awake/alert Orientation Level: Appears intact for tasks assessed Behavior During Session: [facility] for tasks performed    Extremity/Trunk Assessment Right Upper Extremity Assessment RUE ROM/Strength/Tone: Within functional levels RUE Sensation: WFL - Light Touch RUE Coordination: WFL - gross motor Left Upper Extremity Assessment LUE ROM/Strength/Tone: Within functional levels LUE Sensation: WFL - Light Touch LUE Coordination: WFL - gross motor Right Lower Extremity Assessment RLE ROM/Strength/Tone: Deficits RLE ROM/Strength/Tone Deficits: 2/5 throughout. AA/ROM right knee 0 to 45 degrees. RLE Sensation: WFL - Light Touch RLE Coordination: WFL - gross motor Left Lower Extremity Assessment LLE ROM/Strength/Tone: Within functional levels LLE Sensation: WFL - Light Touch LLE Coordination: WFL - gross motor Trunk Assessment Trunk Assessment: Normal   Balance Balance Balance Assessed: No  End of Session PT - End of Session Equipment Utilized During Treatment: Gait belt Activity Tolerance: Patient tolerated treatment well Patient left: in chair;with  call bell/phone within reach Nurse Communication: Mobility status CPM Right Knee CPM Right Knee: Off  GP     Cephus Shelling 05/10/2012, 9:50 AM  05/10/2012 Cephus Shelling, PT, DPT [phone]

## 2012-05-11 DIAGNOSIS — M1711 Unilateral primary osteoarthritis, right knee: Secondary | ICD-10-CM | POA: Diagnosis present

## 2012-05-11 LAB — CBC
HCT: 31.3 % — ABNORMAL LOW (ref 39.0–52.0)
MCH: 32.2 pg (ref 26.0–34.0)
MCV: 94.3 fL (ref 78.0–100.0)
Platelets: 143 10*3/uL — ABNORMAL LOW (ref 150–400)
RBC: 3.32 MIL/uL — ABNORMAL LOW (ref 4.22–5.81)
RDW: 13.5 % (ref 11.5–15.5)

## 2012-05-11 MED ORDER — OXYCODONE-ACETAMINOPHEN 5-325 MG PO TABS: 1.0000 | ORAL_TABLET | ORAL | Status: DC | PRN

## 2012-05-11 MED ORDER — ASPIRIN 325 MG PO TBEC: 325.0000 mg | DELAYED_RELEASE_TABLET | Freq: Two times a day (BID) | ORAL | Status: DC

## 2012-05-11 MED ORDER — BISACODYL 5 MG PO TBEC: 5.0000 mg | DELAYED_RELEASE_TABLET | Freq: Every day | ORAL | Status: DC | PRN

## 2012-05-11 NOTE — Discharge Summary (Signed)
Patient ID: Jerry [name] MRN: [id number] DOB/AGE: [birth year] 78 y.o.  Admit date: 05/09/2012 Discharge date: 05/12/2012  Admission Diagnoses:  Principal Problem:  *Osteoarthritis of right knee   Discharge Diagnoses:  Same  Past Medical History  Diagnosis Date  . Hypertension     takes Verapamil,Losartan,and Metoprolol daily   . Peripheral edema     takes HCTZ daily  . Hyperlipidemia     takes Lipitor nightly  . Coronary artery disease   . Myocardial infarction     x 2;last time was about 8-18yrs ago   . Joint pain   . Chronic back pain     scoliosis--pt states unable to have surgery bc of age  . GERD (gastroesophageal reflux disease)     takes Nexium daily  . History of colon polyps   . Urinary frequency     takes Vesicare and Flomax daily  . Urinary urgency   . Cancer     prostate cancer  . Cataracts, bilateral     immature  . Anxiety     takes Valium prn  . Bleeding nose     occasionally    Surgeries: Procedure(s): TOTAL KNEE ARTHROPLASTY on 05/09/2012   Consultants:    Discharged Condition: Improved  Hospital Course: Jerry [name] is an 78 y.o. male who was admitted 05/09/2012 for operative treatment ofOsteoarthritis of right knee. Patient has severe unremitting pain that affects sleep, daily activities, and work/hobbies. After pre-op clearance the patient was taken to the operating room on 05/09/2012 and underwent  Procedure(s): TOTAL KNEE ARTHROPLASTY.    Patient was given perioperative antibiotics: Anti-infectives     Start     Dose/Rate Route Frequency Ordered Stop   05/09/12 1442   cefUROXime (ZINACEF) injection  Status:  Discontinued          As needed 05/09/12 1442 05/09/12 1556   05/08/12 1441   ceFAZolin (ANCEF) IVPB 2 g/50 mL premix        2 g 100 mL/hr over 30 Minutes Intravenous 60 min pre-op 05/08/12 1441 05/09/12 1419           Patient was given sequential compression devices, early ambulation, and chemoprophylaxis to  prevent DVT.  Patient benefited maximally from hospital stay and there were no complications.    Recent vital signs: Patient Vitals for the past 24 hrs:  BP Temp Pulse Resp SpO2  05/11/12 0509 108/56 mmHg 98.4 F (36.9 C) 73  18  93 %  05-24-2012 2133 111/70 mmHg 98.4 F (36.9 C) 96  18  94 %  24-May-2012 1428 133/76 mmHg 98.5 F (36.9 C) 92  18  96 %     Recent laboratory studies:  [facility] 05/11/12 0635 2012/05/24 0540  WBC 13.8* 13.5*  HGB 10.7* 11.7*  HCT 31.3* 33.5*  PLT 143* 157  NA -- 138  K -- 4.0  CL -- 102  CO2 -- 26  BUN -- 21  CREATININE -- 0.84  GLUCOSE -- 172*  INR -- --  CALCIUM -- 8.5     Discharge Medications:     Medication List     As of 05/11/2012  8:54 AM    TAKE these medications         aspirin 325 MG EC tablet   Take 1 tablet (325 mg total) by mouth 2 (two) times daily.      atorvastatin 40 MG tablet   Commonly known as: LIPITOR   Take 40 mg by mouth daily.  bisacodyl 5 MG EC tablet   Commonly known as: DULCOLAX   Take 1 tablet (5 mg total) by mouth daily as needed.      cholecalciferol 1000 UNITS tablet   Commonly known as: VITAMIN D   Take 1,000 Units by mouth daily.      diazepam 5 MG tablet   Commonly known as: VALIUM   Take 5 mg by mouth daily as needed. For anxiety      esomeprazole 40 MG capsule   Commonly known as: NEXIUM   Take 40 mg by mouth daily before breakfast.      hydrochlorothiazide 12.5 MG tablet   Commonly known as: HYDRODIURIL   Take 12.5 mg by mouth daily.      losartan 100 MG tablet   Commonly known as: COZAAR   Take 100 mg by mouth daily.      meloxicam 15 MG tablet   Commonly known as: MOBIC   Take 15 mg by mouth daily.      metoprolol succinate 25 MG 24 hr tablet   Commonly known as: TOPROL-XL   Take 25 mg by mouth daily.      nitroGLYCERIN 0.4 MG SL tablet   Commonly known as: NITROSTAT   Place 0.4 mg under the tongue every 5 (five) minutes as needed. For chest pain       oxyCODONE-acetaminophen 5-325 MG per tablet   Commonly known as: PERCOCET/ROXICET   Take 1-2 tablets by mouth every 4 (four) hours as needed for pain.      prasugrel 10 MG Tabs   Commonly known as: EFFIENT   Take 10 mg by mouth daily.      psyllium 58.6 % powder   Commonly known as: METAMUCIL   Take 1 packet by mouth 3 (three) times daily.      solifenacin 10 MG tablet   Commonly known as: VESICARE   Take 10 mg by mouth daily.      Tamsulosin HCl 0.4 MG Caps   Commonly known as: FLOMAX   Take 0.4 mg by mouth daily after supper.      triamcinolone cream 0.5 %   Commonly known as: KENALOG   Apply 1 application topically daily as needed.      verapamil 240 MG CR tablet   Commonly known as: CALAN-SR   Take 240 mg by mouth at bedtime.        Diagnostic Studies: Dg Chest 2 View  04/30/2012  *RADIOLOGY REPORT*  Clinical Data: Preop for right knee replacement  CHEST - 2 VIEW  Comparison: 01/28/2010  Findings: Cardiomediastinal silhouette is stable.  Tortuous thoracic aorta again noted.  Stable mild degenerative changes thoracic spine.  Stable streaky scarring left base.  Stable compression deformity probable L1 vertebral body. No acute infiltrate or pulmonary edema.  IMPRESSION:   No active disease.  Stable chronic findings as described above.   Original Report Authenticated By: Natasha Mead, M.D.     Disposition:       Discharge Orders    Future Orders Please Complete By Expires   Increase activity slowly      Walker       May shower / Bathe      Driving Restrictions      Comments:   No driving for 2 weeks.   Change dressing (specify)      Comments:   Dressing change as needed.   Call MD for:  temperature >100.4      Call MD for:  severe uncontrolled pain      Call MD for:  redness, tenderness, or signs of infection (pain, swelling, redness, odor or green/yellow discharge around incision site)      Discharge instructions      Comments:   F/U with Dr. Turner Daniels as scheduled  (POD #14).  [phone]         Signed: Hazle Nordmann. 05/11/2012, 8:54 AM

## 2012-05-11 NOTE — Progress Notes (Signed)
PT Treatment Note:   05/11/12 1110  PT Visit Information  Last PT Received On 05/11/12  Assistance Needed +2  PT Time Calculation  PT Start Time 1030  PT Stop Time 1045  PT Time Calculation (min) 15 min  Subjective Data  Subjective "I am ready to move."  Patient Stated Goal Go to [facility] for a little bit.  Precautions  Precautions Knee  Precaution Booklet Issued No  Restrictions  Weight Bearing Restrictions Yes  RLE Weight Bearing WBAT  Cognition  Overall Cognitive Status Appears within functional limits for tasks assessed/performed  Arousal/Alertness Awake/alert  Orientation Level Appears intact for tasks assessed  Behavior During Session [facility] for tasks performed  Bed Mobility  Bed Mobility Sit to Supine  Sit to Supine 1: +2 Total assist;HOB flat  Sit to Supine: Patient Percentage 70%  Details for Bed Mobility Assistance Assist for right LE and trunk to slow descent to bed. Cues for safe sequence.  Transfers  Transfers Sit to Stand;Stand to Sit  Sit to Stand 1: +2 Total assist;With upper extremity assist;From chair/3-in-1  Sit to Stand: Patient Percentage 70%  Stand to Sit 1: +2 Total assist;With upper extremity assist;To bed  Stand to Sit: Patient Percentage 70%  Details for Transfer Assistance Assist to off weight right LE due to pain. Cues for sequence and hand/right LE placement.  Ambulation/Gait  Ambulation/Gait Assistance 1: +2 Total assist  Ambulation/Gait: Patient Percentage 60%  Ambulation Distance (Feet) 10 Feet  Assistive device Rolling walker  Ambulation/Gait Assistance Details Assist to off weight right LE due to pain. Distance limited by pain. Cues for safe sequence.  Gait Pattern Step-to pattern;Decreased step length - right;Decreased stance time - right;Right flexed knee in stance;Antalgic;Trunk flexed  Stairs No  Engineering geologist No  Balance  Balance Assessed No  PT - End of Session  Equipment Utilized During Treatment  Gait belt  Activity Tolerance Patient tolerated treatment well;Patient limited by pain  Patient left in bed;with call bell/phone within reach  Nurse Communication Mobility status  PT - Assessment/Plan  Comments on Treatment Session Pt admitted s/p right TKA and continues to progress with therapy. Pt limited by pain this session, but still tolerates slight ambulation distance.  PT Plan Discharge plan remains appropriate;Frequency remains appropriate  PT Frequency 7X/week  Follow Up Recommendations SNF  Equipment Recommended Rolling walker with 5" wheels;3 in 1 bedside comode  Acute Rehab PT Goals  PT Goal Formulation With patient  Time For Goal Achievement 05/24/12  Potential to Achieve Goals Good  PT Goal: Sit to Supine/Side - Progress Progressing toward goal  PT Goal: Sit to Stand - Progress Progressing toward goal  PT Goal: Stand to Sit - Progress Progressing toward goal  PT Goal: Ambulate - Progress Progressing toward goal  PT General Charges  $$ ACUTE PT VISIT 1 Procedure  PT Treatments  $Gait Training 8-22 mins    Pain:  5/10 in right knee. Pt repositioned.  05/11/2012 Cephus Shelling, PT, DPT [phone]

## 2012-05-11 NOTE — Progress Notes (Signed)
PATIENT ID: Jerry [name]  MRN: [id number]  DOB/AGE:  Nov 04, 1929 / 78 y.o.  2 Days Post-Op Procedure(s) (LRB): TOTAL KNEE ARTHROPLASTY (Right)    PROGRESS NOTE Subjective: Patient is alert, oriented, no Nausea, no Vomiting, yes passing gas, no Bowel Movement. Taking PO well. Denies SOB, Chest or Calf Pain. Using Incentive Spirometer, PAS in place. Ambulating slowly with PT, CPM 0-70 Patient reports pain as moderate  .    Objective: Vital signs in last 24 hours: Filed Vitals:   05/10/12 0600 05/10/12 1428 05/10/12 2133 05/11/12 0509  BP: 125/77 133/76 111/70 108/56  Pulse: 88 92 96 73  Temp: 98 F (36.7 C) 98.5 F (36.9 C) 98.4 F (36.9 C) 98.4 F (36.9 C)  TempSrc:      Resp: 18 18 18 18   SpO2: 97% 96% 94% 93%      Intake/Output from previous day: I/O last 3 completed shifts: In: 5220.8 [P.O.:1800; I.V.:3420.8] Out: 1755 [Urine:1430; Drains:325]   Intake/Output this shift:     LABORATORY DATA:  Basename 05/11/12 0635 05/10/12 0540  WBC 13.8* 13.5*  HGB 10.7* 11.7*  HCT 31.3* 33.5*  PLT 143* 157  NA -- 138  K -- 4.0  CL -- 102  CO2 -- 26  BUN -- 21  CREATININE -- 0.84  GLUCOSE -- 172*  GLUCAP -- --  INR -- --  CALCIUM -- 8.5    Examination: Neurologically intact ABD soft Neurovascular intact Sensation intact distally Intact pulses distally Dorsiflexion/Plantar flexion intact Incision: dressing C/D/I}  Assessment:   2 Days Post-Op Procedure(s) (LRB): TOTAL KNEE ARTHROPLASTY (Right) ADDITIONAL DIAGNOSIS:  none  Plan: PT/OT WBAT, CPM 5/hrs day until ROM 0-90 degrees, then D/C CPM DVT Prophylaxis:  SCDx72hrs, ASA 325 mg BID x 2 weeks DISCHARGE PLAN: Skilled Nursing Facility/Rehab (Patient requests a private room at [facility]).  D/C tomorrow (Saturday) if bed available. DISCHARGE NEEDS: HHPT, HHRN, CPM, Walker and 3-in-1 comode seat     Jerry [name] M. 05/11/2012, 8:49 AM

## 2012-05-11 NOTE — Progress Notes (Signed)
Physical Therapy Treatment Patient Details Name: Jerry [name] MRN: [id number] DOB: 02-Jun-1930 Today's Date: 05/11/2012 Time: 0830-0906 PT Time Calculation (min): 36 min  PT Assessment / Plan / Recommendation Comments on Treatment Session  Pt admitted s/p right TKA and is motivated to work with therapy. Able to increase ambulation distance this am. Progressing well.    Follow Up Recommendations  SNF     Does the patient have the potential to tolerate intense rehabilitation     Barriers to Discharge        Equipment Recommendations  Rolling walker with 5" wheels;3 in 1 bedside comode    Recommendations for Other Services    Frequency 7X/week   Plan Discharge plan remains appropriate;Frequency remains appropriate    Precautions / Restrictions Precautions Precautions: Knee Precaution Booklet Issued: No Restrictions Weight Bearing Restrictions: Yes RLE Weight Bearing: Weight bearing as tolerated   Pertinent Vitals/Pain 2/10 in right knee. Pt repositioned.    Mobility  Bed Mobility Bed Mobility: Supine to Sit Supine to Sit: 4: Min assist;HOB flat Details for Bed Mobility Assistance: Assist for right LE only due to pain. Cues for sequence. Transfers Transfers: Sit to Stand;Stand to Sit Sit to Stand: 1: +2 Total assist;With upper extremity assist;From bed Sit to Stand: Patient Percentage: 70% Stand to Sit: 1: +2 Total assist;With upper extremity assist;To chair/3-in-1 Stand to Sit: Patient Percentage: 70% Details for Transfer Assistance: Assist to translate trunk anterior and off weight right LE due to pain. Cues for safest hand/right LE placement. Ambulation/Gait Ambulation/Gait Assistance: 1: +2 Total assist Ambulation/Gait: Patient Percentage: 60% Ambulation Distance (Feet): 35 Feet Assistive device: Rolling walker Ambulation/Gait Assistance Details: Assist for balance and to off weight right LE due to pain. Max cues for safe sequence with RW. Gait Pattern:  Step-to pattern;Decreased step length - right;Decreased stance time - right;Right flexed knee in stance;Antalgic;Trunk flexed Stairs: No Wheelchair Mobility Wheelchair Mobility: No    Exercises Total Joint Exercises Ankle Circles/Pumps: AROM;Right;10 reps;Supine Quad Sets: AROM;Right;10 reps;Supine Heel Slides: AAROM;Right;10 reps;Supine   PT Diagnosis:    PT Problem List:   PT Treatment Interventions:     PT Goals Acute Rehab PT Goals PT Goal Formulation: With patient Time For Goal Achievement: 05/24/12 Potential to Achieve Goals: Good PT Goal: Supine/Side to Sit - Progress: Progressing toward goal PT Goal: Sit to Stand - Progress: Progressing toward goal PT Goal: Stand to Sit - Progress: Progressing toward goal PT Goal: Ambulate - Progress: Progressing toward goal PT Goal: Perform Home Exercise Program - Progress: Progressing toward goal  Visit Information  Last PT Received On: 05/11/12 Assistance Needed: +2    Subjective Data  Subjective: "I am glad you are here." Patient Stated Goal: Go to [facility] for a little bit.   Cognition  Overall Cognitive Status: Appears within functional limits for tasks assessed/performed Arousal/Alertness: Awake/alert Orientation Level: Appears intact for tasks assessed Behavior During Session: [facility] for tasks performed    Balance  Balance Balance Assessed: No  End of Session PT - End of Session Equipment Utilized During Treatment: Gait belt Activity Tolerance: Patient tolerated treatment well Patient left: in chair;with call bell/phone within reach Nurse Communication: Mobility status   GP     Cephus Shelling 05/11/2012, 9:11 AM  05/11/2012 Cephus Shelling, PT, DPT [phone]

## 2012-05-11 NOTE — Progress Notes (Signed)
Patient can d/c to [facility] in the am. All paperwork has been completed.  Sabino Niemann, MSW, [employer] [phone]

## 2012-05-12 DIAGNOSIS — F411 Generalized anxiety disorder: Secondary | ICD-10-CM | POA: Diagnosis not present

## 2012-05-12 DIAGNOSIS — E785 Hyperlipidemia, unspecified: Secondary | ICD-10-CM | POA: Diagnosis not present

## 2012-05-12 DIAGNOSIS — S99929A Unspecified injury of unspecified foot, initial encounter: Secondary | ICD-10-CM | POA: Diagnosis not present

## 2012-05-12 DIAGNOSIS — M6281 Muscle weakness (generalized): Secondary | ICD-10-CM | POA: Diagnosis not present

## 2012-05-12 DIAGNOSIS — M1710 Unilateral primary osteoarthritis, unspecified knee: Secondary | ICD-10-CM | POA: Diagnosis not present

## 2012-05-12 DIAGNOSIS — M25569 Pain in unspecified knee: Secondary | ICD-10-CM | POA: Diagnosis not present

## 2012-05-12 DIAGNOSIS — N400 Enlarged prostate without lower urinary tract symptoms: Secondary | ICD-10-CM | POA: Diagnosis not present

## 2012-05-12 DIAGNOSIS — I252 Old myocardial infarction: Secondary | ICD-10-CM | POA: Diagnosis not present

## 2012-05-12 DIAGNOSIS — Z86010 Personal history of colonic polyps: Secondary | ICD-10-CM | POA: Diagnosis not present

## 2012-05-12 DIAGNOSIS — R279 Unspecified lack of coordination: Secondary | ICD-10-CM | POA: Diagnosis not present

## 2012-05-12 DIAGNOSIS — Z96659 Presence of unspecified artificial knee joint: Secondary | ICD-10-CM | POA: Diagnosis not present

## 2012-05-12 DIAGNOSIS — Z471 Aftercare following joint replacement surgery: Secondary | ICD-10-CM | POA: Diagnosis not present

## 2012-05-12 DIAGNOSIS — I10 Essential (primary) hypertension: Secondary | ICD-10-CM | POA: Diagnosis not present

## 2012-05-12 DIAGNOSIS — I2510 Atherosclerotic heart disease of native coronary artery without angina pectoris: Secondary | ICD-10-CM | POA: Diagnosis not present

## 2012-05-12 DIAGNOSIS — K5900 Constipation, unspecified: Secondary | ICD-10-CM | POA: Diagnosis not present

## 2012-05-12 DIAGNOSIS — Z8546 Personal history of malignant neoplasm of prostate: Secondary | ICD-10-CM | POA: Diagnosis not present

## 2012-05-12 DIAGNOSIS — I2109 ST elevation (STEMI) myocardial infarction involving other coronary artery of anterior wall: Secondary | ICD-10-CM | POA: Diagnosis not present

## 2012-05-12 LAB — CBC
HCT: 29.5 % — ABNORMAL LOW (ref 39.0–52.0)
Hemoglobin: 10.1 g/dL — ABNORMAL LOW (ref 13.0–17.0)
MCH: 32.5 pg (ref 26.0–34.0)
MCHC: 34.2 g/dL (ref 30.0–36.0)
RDW: 13.2 % (ref 11.5–15.5)

## 2012-05-12 NOTE — Progress Notes (Signed)
Physical Therapy Treatment Patient Details Name: Jerry [name] MRN: [id number] DOB: 07-13-1929 Today's Date: 05/12/2012 Time: 2542-7062 PT Time Calculation (min): 28 min  PT Assessment / Plan / Recommendation Comments on Treatment Session  Pt admitted s/p right TKA and is progressing well with therapy. Pt able to increase independence with mobility today and remains motivated.    Follow Up Recommendations  SNF     Does the patient have the potential to tolerate intense rehabilitation     Barriers to Discharge        Equipment Recommendations  Rolling walker with 5" wheels;3 in 1 bedside comode    Recommendations for Other Services    Frequency 7X/week   Plan Discharge plan remains appropriate;Frequency remains appropriate    Precautions / Restrictions Precautions Precautions: Knee Precaution Booklet Issued: No Restrictions Weight Bearing Restrictions: Yes RLE Weight Bearing: Weight bearing as tolerated   Pertinent Vitals/Pain 5/10 in right knee. Pt repositioned.    Mobility  Bed Mobility Bed Mobility: Supine to Sit Supine to Sit: 4: Min assist;HOB flat Details for Bed Mobility Assistance: Assist for right LE due to pain. Cues for safe sequence to protect knee. Transfers Transfers: Sit to Stand;Stand to Sit Sit to Stand: 3: Mod assist;With upper extremity assist;From bed Stand to Sit: 4: Min assist;With upper extremity assist;To chair/3-in-1 Details for Transfer Assistance: Assist to translate anterior over BOS with cues for safest hand/right LE placement. Ambulation/Gait Ambulation/Gait Assistance: 4: Min assist Ambulation Distance (Feet): 15 Feet Assistive device: Rolling walker Ambulation/Gait Assistance Details: Assist for balance and to off weight right LE due to pain. Max cues for safe sequence. Distance limited by pain this am. Gait Pattern: Step-to pattern;Decreased step length - right;Decreased stance time - right;Right flexed knee in  stance;Antalgic;Trunk flexed Stairs: No Wheelchair Mobility Wheelchair Mobility: No    Exercises Total Joint Exercises Ankle Circles/Pumps: AROM;Right;10 reps;Supine Quad Sets: AROM;Right;10 reps;Supine Heel Slides: AAROM;Right;10 reps;Supine Hip ABduction/ADduction: AAROM;Right;10 reps;Supine Straight Leg Raises: AAROM;Right;10 reps;Supine Goniometric ROM: AA/ROM right knee 0-80 degrees.   PT Diagnosis:    PT Problem List:   PT Treatment Interventions:     PT Goals Acute Rehab PT Goals PT Goal Formulation: With patient Time For Goal Achievement: 05/24/12 Potential to Achieve Goals: Good PT Goal: Supine/Side to Sit - Progress: Progressing toward goal PT Goal: Sit to Stand - Progress: Progressing toward goal PT Goal: Stand to Sit - Progress: Met PT Goal: Ambulate - Progress: Progressing toward goal PT Goal: Perform Home Exercise Program - Progress: Progressing toward goal  Visit Information  Last PT Received On: 05/12/12 Assistance Needed: +1    Subjective Data  Subjective: "I am ready to go." Patient Stated Goal: Go to [facility] for a little bit.   Cognition  Overall Cognitive Status: Appears within functional limits for tasks assessed/performed Arousal/Alertness: Awake/alert Orientation Level: Appears intact for tasks assessed Behavior During Session: [facility] for tasks performed    Balance  Balance Balance Assessed: No  End of Session PT - End of Session Equipment Utilized During Treatment: Gait belt Activity Tolerance: Patient tolerated treatment well;Patient limited by pain Patient left: in chair;with call bell/phone within reach Nurse Communication: Mobility status   GP     Cephus Shelling 05/12/2012, 8:14 AM  05/12/2012 Cephus Shelling, PT, DPT [phone]

## 2012-05-12 NOTE — Clinical Social Work Note (Signed)
Pt to transfer to [facility] today via [facility]. Pt and SNF aware of d/c. D/C packet complete with signed FL2, chart copy, and signed hard Rx. CSW signing off as no other CSW needs identified at this time.  Dellie Burns, MSW, LCSWA [phone] (Weekends 8:00am-4:30pm)

## 2012-05-12 NOTE — Clinical Social Work Note (Signed)
Clinical Social Work Department CLINICAL SOCIAL WORK PLACEMENT NOTE 05/12/2012  Patient:  Jerry [name], Jerry [name]  Account Number:  [id number] Admit date:  05/09/2012  Clinical Social Worker:  Skip Mayer  Date/time:  05/12/2012 10:00 AM  Clinical Social Work is seeking post-discharge placement for this patient at the following level of care:   SKILLED NURSING   (*CSW will update this form in Epic as items are completed)     Patient/family provided with [facility] Department of Clinical Social Work's list of facilities offering this level of care within the geographic area requested by the patient (or if unable, by the patient's family).    Patient/family informed of their freedom to choose among providers that offer the needed level of care, that participate in Medicare, Medicaid or managed care program needed by the patient, have an available bed and are willing to accept the patient.    Patient/family informed of [facility]' ownership interest in [facility], as well as of the fact that they are under no obligation to receive care at this facility.  PASARR submitted to EDS on  PASARR number received from EDS on   FL2 transmitted to all facilities in geographic area requested by pt/family on   FL2 transmitted to all facilities within larger geographic area on   Patient informed that his/her managed care company has contracts with or will negotiate with  certain facilities, including the following:     Patient/family informed of bed offers received:   Patient chooses bed at  Physician recommends and patient chooses bed at    Patient to be transferred to [facility] on  05/12/2012 (JB) Patient to be transferred to facility by Darnell Level)  The following physician request were entered in Epic:   Additional Comments:

## 2012-05-12 NOTE — Progress Notes (Signed)
Subjective: 3 Days Post-Op Procedure(s) (LRB): TOTAL KNEE ARTHROPLASTY (Right) Patient reports pain as 3 on 0-10 scale.    Objective: Vital signs in last 24 hours: Temp:  [98.3 F (36.8 C)-99.9 F (37.7 C)] 98.3 F (36.8 C) (12/07 0717) Pulse Rate:  [78-104] 94  (12/07 0717) Resp:  [16-20] 18  (12/07 0717) BP: (118-154)/(60-85) 146/85 mmHg (12/07 0717) SpO2:  [92 %-98 %] 98 % (12/07 0717)  Intake/Output from previous day: 12/06 0701 - 12/07 0700 In: 360 [P.O.:360] Out: 2700 [Urine:2700] Intake/Output this shift: Total I/O In: 240 [P.O.:240] Out: -    Basename 05/12/12 0605 05/11/12 0635 05/10/12 0540  HGB 10.1* 10.7* 11.7*    Basename 05/12/12 0605 05/11/12 0635  WBC 15.8* 13.8*  RBC 3.11* 3.32*  HCT 29.5* 31.3*  PLT 154 143*    Basename 05/10/12 0540  NA 138  K 4.0  CL 102  CO2 26  BUN 21  CREATININE 0.84  GLUCOSE 172*  CALCIUM 8.5   No results found for this basename: LABPT:2,INR:2 in the last 72 hours  Neurovascular intact Sensation intact distally Intact pulses distally Dorsiflexion/Plantar flexion intact Incision: dressing C/D/I Compartment soft  Assessment/Plan: 3 Days Post-Op Procedure(s) (LRB): TOTAL KNEE ARTHROPLASTY (Right) Plan: Discharge to SNF  Kaisyn Reinhold G 05/12/2012, 8:53 AM

## 2012-05-16 DIAGNOSIS — I2510 Atherosclerotic heart disease of native coronary artery without angina pectoris: Secondary | ICD-10-CM | POA: Diagnosis not present

## 2012-05-16 DIAGNOSIS — K5900 Constipation, unspecified: Secondary | ICD-10-CM | POA: Diagnosis not present

## 2012-05-16 DIAGNOSIS — I10 Essential (primary) hypertension: Secondary | ICD-10-CM | POA: Diagnosis not present

## 2012-06-06 DIAGNOSIS — D4959 Neoplasm of unspecified behavior of other genitourinary organ: Secondary | ICD-10-CM | POA: Diagnosis not present

## 2012-06-06 DIAGNOSIS — Z96659 Presence of unspecified artificial knee joint: Secondary | ICD-10-CM | POA: Diagnosis not present

## 2012-06-06 DIAGNOSIS — Z471 Aftercare following joint replacement surgery: Secondary | ICD-10-CM | POA: Diagnosis not present

## 2012-06-06 DIAGNOSIS — M1710 Unilateral primary osteoarthritis, unspecified knee: Secondary | ICD-10-CM | POA: Diagnosis not present

## 2012-06-06 DIAGNOSIS — I2510 Atherosclerotic heart disease of native coronary artery without angina pectoris: Secondary | ICD-10-CM | POA: Diagnosis not present

## 2012-06-06 DIAGNOSIS — M1990 Unspecified osteoarthritis, unspecified site: Secondary | ICD-10-CM | POA: Diagnosis not present

## 2012-06-06 DIAGNOSIS — I10 Essential (primary) hypertension: Secondary | ICD-10-CM | POA: Diagnosis not present

## 2012-06-08 DIAGNOSIS — I2510 Atherosclerotic heart disease of native coronary artery without angina pectoris: Secondary | ICD-10-CM | POA: Diagnosis not present

## 2012-06-08 DIAGNOSIS — D4959 Neoplasm of unspecified behavior of other genitourinary organ: Secondary | ICD-10-CM | POA: Diagnosis not present

## 2012-06-08 DIAGNOSIS — M1990 Unspecified osteoarthritis, unspecified site: Secondary | ICD-10-CM | POA: Diagnosis not present

## 2012-06-08 DIAGNOSIS — Z96659 Presence of unspecified artificial knee joint: Secondary | ICD-10-CM | POA: Diagnosis not present

## 2012-06-08 DIAGNOSIS — I10 Essential (primary) hypertension: Secondary | ICD-10-CM | POA: Diagnosis not present

## 2012-06-08 DIAGNOSIS — Z471 Aftercare following joint replacement surgery: Secondary | ICD-10-CM | POA: Diagnosis not present

## 2012-06-09 DIAGNOSIS — Z471 Aftercare following joint replacement surgery: Secondary | ICD-10-CM | POA: Diagnosis not present

## 2012-06-09 DIAGNOSIS — Z96659 Presence of unspecified artificial knee joint: Secondary | ICD-10-CM | POA: Diagnosis not present

## 2012-06-09 DIAGNOSIS — M1990 Unspecified osteoarthritis, unspecified site: Secondary | ICD-10-CM | POA: Diagnosis not present

## 2012-06-09 DIAGNOSIS — D4959 Neoplasm of unspecified behavior of other genitourinary organ: Secondary | ICD-10-CM | POA: Diagnosis not present

## 2012-06-09 DIAGNOSIS — I10 Essential (primary) hypertension: Secondary | ICD-10-CM | POA: Diagnosis not present

## 2012-06-09 DIAGNOSIS — I2510 Atherosclerotic heart disease of native coronary artery without angina pectoris: Secondary | ICD-10-CM | POA: Diagnosis not present

## 2012-06-11 DIAGNOSIS — D4959 Neoplasm of unspecified behavior of other genitourinary organ: Secondary | ICD-10-CM | POA: Diagnosis not present

## 2012-06-11 DIAGNOSIS — I10 Essential (primary) hypertension: Secondary | ICD-10-CM | POA: Diagnosis not present

## 2012-06-11 DIAGNOSIS — M1990 Unspecified osteoarthritis, unspecified site: Secondary | ICD-10-CM | POA: Diagnosis not present

## 2012-06-11 DIAGNOSIS — I2510 Atherosclerotic heart disease of native coronary artery without angina pectoris: Secondary | ICD-10-CM | POA: Diagnosis not present

## 2012-06-11 DIAGNOSIS — Z471 Aftercare following joint replacement surgery: Secondary | ICD-10-CM | POA: Diagnosis not present

## 2012-06-11 DIAGNOSIS — Z96659 Presence of unspecified artificial knee joint: Secondary | ICD-10-CM | POA: Diagnosis not present

## 2012-06-13 DIAGNOSIS — D4959 Neoplasm of unspecified behavior of other genitourinary organ: Secondary | ICD-10-CM | POA: Diagnosis not present

## 2012-06-13 DIAGNOSIS — I2510 Atherosclerotic heart disease of native coronary artery without angina pectoris: Secondary | ICD-10-CM | POA: Diagnosis not present

## 2012-06-13 DIAGNOSIS — Z96659 Presence of unspecified artificial knee joint: Secondary | ICD-10-CM | POA: Diagnosis not present

## 2012-06-13 DIAGNOSIS — M1990 Unspecified osteoarthritis, unspecified site: Secondary | ICD-10-CM | POA: Diagnosis not present

## 2012-06-13 DIAGNOSIS — Z471 Aftercare following joint replacement surgery: Secondary | ICD-10-CM | POA: Diagnosis not present

## 2012-06-13 DIAGNOSIS — I10 Essential (primary) hypertension: Secondary | ICD-10-CM | POA: Diagnosis not present

## 2012-06-14 DIAGNOSIS — M1990 Unspecified osteoarthritis, unspecified site: Secondary | ICD-10-CM | POA: Diagnosis not present

## 2012-06-14 DIAGNOSIS — D4959 Neoplasm of unspecified behavior of other genitourinary organ: Secondary | ICD-10-CM | POA: Diagnosis not present

## 2012-06-14 DIAGNOSIS — Z96659 Presence of unspecified artificial knee joint: Secondary | ICD-10-CM | POA: Diagnosis not present

## 2012-06-14 DIAGNOSIS — I10 Essential (primary) hypertension: Secondary | ICD-10-CM | POA: Diagnosis not present

## 2012-06-14 DIAGNOSIS — Z471 Aftercare following joint replacement surgery: Secondary | ICD-10-CM | POA: Diagnosis not present

## 2012-06-14 DIAGNOSIS — I2510 Atherosclerotic heart disease of native coronary artery without angina pectoris: Secondary | ICD-10-CM | POA: Diagnosis not present

## 2012-06-15 DIAGNOSIS — I10 Essential (primary) hypertension: Secondary | ICD-10-CM | POA: Diagnosis not present

## 2012-06-15 DIAGNOSIS — Z96659 Presence of unspecified artificial knee joint: Secondary | ICD-10-CM | POA: Diagnosis not present

## 2012-06-15 DIAGNOSIS — I2510 Atherosclerotic heart disease of native coronary artery without angina pectoris: Secondary | ICD-10-CM | POA: Diagnosis not present

## 2012-06-15 DIAGNOSIS — D4959 Neoplasm of unspecified behavior of other genitourinary organ: Secondary | ICD-10-CM | POA: Diagnosis not present

## 2012-06-15 DIAGNOSIS — M1990 Unspecified osteoarthritis, unspecified site: Secondary | ICD-10-CM | POA: Diagnosis not present

## 2012-06-15 DIAGNOSIS — Z471 Aftercare following joint replacement surgery: Secondary | ICD-10-CM | POA: Diagnosis not present

## 2012-06-18 DIAGNOSIS — I2510 Atherosclerotic heart disease of native coronary artery without angina pectoris: Secondary | ICD-10-CM | POA: Diagnosis not present

## 2012-06-18 DIAGNOSIS — Z96659 Presence of unspecified artificial knee joint: Secondary | ICD-10-CM | POA: Diagnosis not present

## 2012-06-18 DIAGNOSIS — M1990 Unspecified osteoarthritis, unspecified site: Secondary | ICD-10-CM | POA: Diagnosis not present

## 2012-06-18 DIAGNOSIS — D4959 Neoplasm of unspecified behavior of other genitourinary organ: Secondary | ICD-10-CM | POA: Diagnosis not present

## 2012-06-18 DIAGNOSIS — Z471 Aftercare following joint replacement surgery: Secondary | ICD-10-CM | POA: Diagnosis not present

## 2012-06-18 DIAGNOSIS — I10 Essential (primary) hypertension: Secondary | ICD-10-CM | POA: Diagnosis not present

## 2012-06-19 DIAGNOSIS — Z471 Aftercare following joint replacement surgery: Secondary | ICD-10-CM | POA: Diagnosis not present

## 2012-06-19 DIAGNOSIS — D4959 Neoplasm of unspecified behavior of other genitourinary organ: Secondary | ICD-10-CM | POA: Diagnosis not present

## 2012-06-19 DIAGNOSIS — M1990 Unspecified osteoarthritis, unspecified site: Secondary | ICD-10-CM | POA: Diagnosis not present

## 2012-06-19 DIAGNOSIS — Z96659 Presence of unspecified artificial knee joint: Secondary | ICD-10-CM | POA: Diagnosis not present

## 2012-06-19 DIAGNOSIS — I10 Essential (primary) hypertension: Secondary | ICD-10-CM | POA: Diagnosis not present

## 2012-06-19 DIAGNOSIS — I2510 Atherosclerotic heart disease of native coronary artery without angina pectoris: Secondary | ICD-10-CM | POA: Diagnosis not present

## 2012-06-20 DIAGNOSIS — Z96659 Presence of unspecified artificial knee joint: Secondary | ICD-10-CM | POA: Diagnosis not present

## 2012-06-20 DIAGNOSIS — I10 Essential (primary) hypertension: Secondary | ICD-10-CM | POA: Diagnosis not present

## 2012-06-20 DIAGNOSIS — I2510 Atherosclerotic heart disease of native coronary artery without angina pectoris: Secondary | ICD-10-CM | POA: Diagnosis not present

## 2012-06-20 DIAGNOSIS — Z471 Aftercare following joint replacement surgery: Secondary | ICD-10-CM | POA: Diagnosis not present

## 2012-06-20 DIAGNOSIS — M1990 Unspecified osteoarthritis, unspecified site: Secondary | ICD-10-CM | POA: Diagnosis not present

## 2012-06-20 DIAGNOSIS — D4959 Neoplasm of unspecified behavior of other genitourinary organ: Secondary | ICD-10-CM | POA: Diagnosis not present

## 2012-06-25 DIAGNOSIS — M1710 Unilateral primary osteoarthritis, unspecified knee: Secondary | ICD-10-CM | POA: Diagnosis not present

## 2012-06-25 DIAGNOSIS — Z96659 Presence of unspecified artificial knee joint: Secondary | ICD-10-CM | POA: Diagnosis not present

## 2012-06-26 DIAGNOSIS — Z9861 Coronary angioplasty status: Secondary | ICD-10-CM | POA: Diagnosis not present

## 2012-06-26 DIAGNOSIS — I2510 Atherosclerotic heart disease of native coronary artery without angina pectoris: Secondary | ICD-10-CM | POA: Diagnosis not present

## 2012-06-26 DIAGNOSIS — I10 Essential (primary) hypertension: Secondary | ICD-10-CM | POA: Diagnosis not present

## 2012-06-26 DIAGNOSIS — R0602 Shortness of breath: Secondary | ICD-10-CM | POA: Diagnosis not present

## 2012-06-27 DIAGNOSIS — M1710 Unilateral primary osteoarthritis, unspecified knee: Secondary | ICD-10-CM | POA: Diagnosis not present

## 2012-06-27 DIAGNOSIS — Z96659 Presence of unspecified artificial knee joint: Secondary | ICD-10-CM | POA: Diagnosis not present

## 2012-07-03 DIAGNOSIS — Z96659 Presence of unspecified artificial knee joint: Secondary | ICD-10-CM | POA: Diagnosis not present

## 2012-07-03 DIAGNOSIS — M1710 Unilateral primary osteoarthritis, unspecified knee: Secondary | ICD-10-CM | POA: Diagnosis not present

## 2012-07-06 DIAGNOSIS — M1710 Unilateral primary osteoarthritis, unspecified knee: Secondary | ICD-10-CM | POA: Diagnosis not present

## 2012-07-06 DIAGNOSIS — Z96659 Presence of unspecified artificial knee joint: Secondary | ICD-10-CM | POA: Diagnosis not present

## 2012-07-10 DIAGNOSIS — M1710 Unilateral primary osteoarthritis, unspecified knee: Secondary | ICD-10-CM | POA: Diagnosis not present

## 2012-07-10 DIAGNOSIS — Z96659 Presence of unspecified artificial knee joint: Secondary | ICD-10-CM | POA: Diagnosis not present

## 2012-07-13 DIAGNOSIS — M1710 Unilateral primary osteoarthritis, unspecified knee: Secondary | ICD-10-CM | POA: Diagnosis not present

## 2012-07-13 DIAGNOSIS — Z96659 Presence of unspecified artificial knee joint: Secondary | ICD-10-CM | POA: Diagnosis not present

## 2012-07-17 DIAGNOSIS — Z96659 Presence of unspecified artificial knee joint: Secondary | ICD-10-CM | POA: Diagnosis not present

## 2012-07-17 DIAGNOSIS — M1710 Unilateral primary osteoarthritis, unspecified knee: Secondary | ICD-10-CM | POA: Diagnosis not present

## 2012-07-24 DIAGNOSIS — Z96659 Presence of unspecified artificial knee joint: Secondary | ICD-10-CM | POA: Diagnosis not present

## 2012-07-24 DIAGNOSIS — M1710 Unilateral primary osteoarthritis, unspecified knee: Secondary | ICD-10-CM | POA: Diagnosis not present

## 2012-07-27 DIAGNOSIS — Z96659 Presence of unspecified artificial knee joint: Secondary | ICD-10-CM | POA: Diagnosis not present

## 2012-07-27 DIAGNOSIS — M1710 Unilateral primary osteoarthritis, unspecified knee: Secondary | ICD-10-CM | POA: Diagnosis not present

## 2012-07-31 DIAGNOSIS — Z96659 Presence of unspecified artificial knee joint: Secondary | ICD-10-CM | POA: Diagnosis not present

## 2012-07-31 DIAGNOSIS — M1710 Unilateral primary osteoarthritis, unspecified knee: Secondary | ICD-10-CM | POA: Diagnosis not present

## 2012-08-07 DIAGNOSIS — M1710 Unilateral primary osteoarthritis, unspecified knee: Secondary | ICD-10-CM | POA: Diagnosis not present

## 2012-08-07 DIAGNOSIS — Z96659 Presence of unspecified artificial knee joint: Secondary | ICD-10-CM | POA: Diagnosis not present

## 2012-08-14 DIAGNOSIS — Z96659 Presence of unspecified artificial knee joint: Secondary | ICD-10-CM | POA: Diagnosis not present

## 2012-08-14 DIAGNOSIS — M1710 Unilateral primary osteoarthritis, unspecified knee: Secondary | ICD-10-CM | POA: Diagnosis not present

## 2012-08-17 DIAGNOSIS — Z96659 Presence of unspecified artificial knee joint: Secondary | ICD-10-CM | POA: Diagnosis not present

## 2012-08-24 DIAGNOSIS — M1710 Unilateral primary osteoarthritis, unspecified knee: Secondary | ICD-10-CM | POA: Diagnosis not present

## 2012-08-24 DIAGNOSIS — Z96659 Presence of unspecified artificial knee joint: Secondary | ICD-10-CM | POA: Diagnosis not present

## 2012-08-29 DIAGNOSIS — M25569 Pain in unspecified knee: Secondary | ICD-10-CM | POA: Diagnosis not present

## 2012-08-29 DIAGNOSIS — M1710 Unilateral primary osteoarthritis, unspecified knee: Secondary | ICD-10-CM | POA: Diagnosis not present

## 2012-09-07 DIAGNOSIS — C61 Malignant neoplasm of prostate: Secondary | ICD-10-CM | POA: Diagnosis not present

## 2012-09-11 DIAGNOSIS — E782 Mixed hyperlipidemia: Secondary | ICD-10-CM | POA: Diagnosis not present

## 2012-09-11 DIAGNOSIS — I2510 Atherosclerotic heart disease of native coronary artery without angina pectoris: Secondary | ICD-10-CM | POA: Diagnosis not present

## 2012-09-11 DIAGNOSIS — R0602 Shortness of breath: Secondary | ICD-10-CM | POA: Diagnosis not present

## 2012-09-19 ENCOUNTER — Other Ambulatory Visit (HOSPITAL_COMMUNITY): Payer: Self-pay | Admitting: Cardiology

## 2012-09-19 DIAGNOSIS — I25810 Atherosclerosis of coronary artery bypass graft(s) without angina pectoris: Secondary | ICD-10-CM

## 2012-09-19 DIAGNOSIS — R011 Cardiac murmur, unspecified: Secondary | ICD-10-CM

## 2012-09-19 DIAGNOSIS — R0602 Shortness of breath: Secondary | ICD-10-CM

## 2012-09-25 DIAGNOSIS — N401 Enlarged prostate with lower urinary tract symptoms: Secondary | ICD-10-CM | POA: Diagnosis not present

## 2012-09-25 DIAGNOSIS — C61 Malignant neoplasm of prostate: Secondary | ICD-10-CM | POA: Diagnosis not present

## 2012-09-26 ENCOUNTER — Ambulatory Visit (HOSPITAL_COMMUNITY)
Admission: RE | Admit: 2012-09-26 | Discharge: 2012-09-26 | Disposition: A | Discharge status: Home / Self Care | Payer: Medicare Other | Source: Ambulatory Visit | Attending: Cardiology | Admitting: Cardiology

## 2012-09-26 DIAGNOSIS — I359 Nonrheumatic aortic valve disorder, unspecified: Secondary | ICD-10-CM | POA: Insufficient documentation

## 2012-09-26 DIAGNOSIS — I079 Rheumatic tricuspid valve disease, unspecified: Secondary | ICD-10-CM | POA: Diagnosis not present

## 2012-09-26 DIAGNOSIS — I059 Rheumatic mitral valve disease, unspecified: Secondary | ICD-10-CM | POA: Insufficient documentation

## 2012-09-26 DIAGNOSIS — I25810 Atherosclerosis of coronary artery bypass graft(s) without angina pectoris: Secondary | ICD-10-CM | POA: Insufficient documentation

## 2012-09-26 DIAGNOSIS — I10 Essential (primary) hypertension: Secondary | ICD-10-CM | POA: Insufficient documentation

## 2012-09-26 DIAGNOSIS — R0602 Shortness of breath: Secondary | ICD-10-CM | POA: Insufficient documentation

## 2012-09-26 DIAGNOSIS — R011 Cardiac murmur, unspecified: Secondary | ICD-10-CM | POA: Diagnosis not present

## 2012-09-26 NOTE — Progress Notes (Signed)
2D Echo Performed 09/26/2012    Jamaia Brum, RCS  

## 2012-09-28 ENCOUNTER — Ambulatory Visit (HOSPITAL_COMMUNITY)
Admission: AD | Admit: 2012-09-28 | Discharge: 2012-09-28 | Disposition: A | Discharge status: Home / Self Care | Payer: Medicare Other | Source: Ambulatory Visit | Attending: Cardiology | Admitting: Cardiology

## 2012-09-28 DIAGNOSIS — Z7902 Long term (current) use of antithrombotics/antiplatelets: Secondary | ICD-10-CM | POA: Insufficient documentation

## 2012-09-28 LAB — COMPREHENSIVE METABOLIC PANEL
ALT: <5 U/L (ref 0–53)
Alkaline Phosphatase: 63 U/L (ref 39–117)
BUN: 20 mg/dL (ref 6–23)
CO2: 26 mEq/L (ref 19–32)
Calcium: 9.7 mg/dL (ref 8.4–10.5)
GFR calc Af Amer: >90 mL/min (ref >90)
GFR calc non Af Amer: 80 mL/min — ABNORMAL LOW (ref >90)
Glucose, Bld: 103 mg/dL — ABNORMAL HIGH (ref 70–99)
Sodium: 139 mEq/L (ref 135–145)

## 2012-11-22 DIAGNOSIS — M1710 Unilateral primary osteoarthritis, unspecified knee: Secondary | ICD-10-CM | POA: Diagnosis not present

## 2012-11-27 ENCOUNTER — Telehealth: Payer: Self-pay | Admitting: Cardiology

## 2012-11-27 MED ORDER — LOSARTAN POTASSIUM 100 MG PO TABS: 100.0000 mg | ORAL_TABLET | Freq: Every day | ORAL | Status: DC

## 2012-11-27 NOTE — Telephone Encounter (Signed)
Pt called to notify you to be on the look-out for his prescription for his Losartan-Dr Little had written the original perrscription!

## 2012-11-27 NOTE — Telephone Encounter (Signed)
Losartan refill sent to [facility]

## 2012-12-03 ENCOUNTER — Telehealth: Payer: Self-pay | Admitting: *Deleted

## 2012-12-03 NOTE — Telephone Encounter (Signed)
Jerry [name] he talked to you last week about his prescription-Mix up in directions!

## 2012-12-03 NOTE — Telephone Encounter (Signed)
Will review w/Dr. Herbie Baltimore tomorrow and let pt know how to take Losartan

## 2012-12-03 NOTE — Telephone Encounter (Signed)
I do not remember ever writing Losartan as BID.  Last note is not in Epic, so I can't clarify.

## 2012-12-03 NOTE — Telephone Encounter (Signed)
Per patient he has been taking Losartan 100mg bid and I sent to [facility] QD.  Per hospital D/C 12/13 Losartan presc. 100mg QD, however pt continued taking BID. Will review w/Dr. Harding tomorrow in the office.  If he wants him to take BID will need to resend to pharmacy 

## 2012-12-03 NOTE — Telephone Encounter (Signed)
Per patient he has been taking Losartan 100mg  bid and I sent to [facility] QD.  Per hospital D/C 12/13 Losartan presc. 100mg  QD, however pt continued taking BID. Will review w/Dr. Herbie Baltimore tomorrow in the office.  If he wants him to take BID will need to resend to pharmacy

## 2012-12-04 ENCOUNTER — Telehealth: Payer: Self-pay | Admitting: *Deleted

## 2012-12-04 ENCOUNTER — Telehealth: Payer: Self-pay | Admitting: Cardiology

## 2012-12-04 DIAGNOSIS — M25569 Pain in unspecified knee: Secondary | ICD-10-CM | POA: Diagnosis not present

## 2012-12-04 DIAGNOSIS — M1710 Unilateral primary osteoarthritis, unspecified knee: Secondary | ICD-10-CM | POA: Diagnosis not present

## 2012-12-04 NOTE — Telephone Encounter (Signed)
Patient just left the house for rehab.  Wife will have him call me later today.  Per Dr. Herbie Baltimore he has never prescribed Losartan bid.  If he needs additional Arbs he will prescribe an additional med.  Will tell patient to decease Losartan to 100mg  QD and keep a check on his BP.  If it becomes elevated we will need to see.

## 2012-12-04 NOTE — Telephone Encounter (Signed)
Returning your call! Will be at his home until 2 and than back again at 4!

## 2012-12-04 NOTE — Telephone Encounter (Signed)
Patient just left the house for rehab.  Wife will have him call me later today.  Per Dr. Harding he has never prescribed Losartan bid.  If he needs additional Arbs he will prescribe an additional med.  Will tell patient to decease Losartan to 100mg QD and keep a check on his BP.  If it becomes elevated we will need to see. 

## 2012-12-04 NOTE — Telephone Encounter (Signed)
Pt instructed to take Losartan 100mg  QD and keep a check on his BP.  To let us know if his BP becomes elevated. Pt voiced understanding

## 2012-12-05 DIAGNOSIS — M545 Low back pain: Secondary | ICD-10-CM | POA: Diagnosis not present

## 2012-12-10 DIAGNOSIS — M545 Low back pain: Secondary | ICD-10-CM | POA: Diagnosis not present

## 2012-12-11 DIAGNOSIS — Z23 Encounter for immunization: Secondary | ICD-10-CM | POA: Diagnosis not present

## 2012-12-11 DIAGNOSIS — Z0000 Encounter for general adult medical examination without abnormal findings: Secondary | ICD-10-CM | POA: Diagnosis not present

## 2012-12-11 DIAGNOSIS — M159 Polyosteoarthritis, unspecified: Secondary | ICD-10-CM | POA: Diagnosis not present

## 2012-12-12 DIAGNOSIS — M545 Low back pain: Secondary | ICD-10-CM | POA: Diagnosis not present

## 2013-01-18 DIAGNOSIS — M545 Low back pain: Secondary | ICD-10-CM | POA: Diagnosis not present

## 2013-01-21 DIAGNOSIS — M545 Low back pain: Secondary | ICD-10-CM | POA: Diagnosis not present

## 2013-01-23 DIAGNOSIS — M545 Low back pain: Secondary | ICD-10-CM | POA: Diagnosis not present

## 2013-01-28 DIAGNOSIS — M545 Low back pain: Secondary | ICD-10-CM | POA: Diagnosis not present

## 2013-01-30 DIAGNOSIS — M545 Low back pain: Secondary | ICD-10-CM | POA: Diagnosis not present

## 2013-02-05 DIAGNOSIS — M545 Low back pain: Secondary | ICD-10-CM | POA: Diagnosis not present

## 2013-02-08 DIAGNOSIS — M545 Low back pain: Secondary | ICD-10-CM | POA: Diagnosis not present

## 2013-02-11 DIAGNOSIS — M545 Low back pain: Secondary | ICD-10-CM | POA: Diagnosis not present

## 2013-02-13 DIAGNOSIS — M545 Low back pain: Secondary | ICD-10-CM | POA: Diagnosis not present

## 2013-02-18 DIAGNOSIS — M545 Low back pain: Secondary | ICD-10-CM | POA: Diagnosis not present

## 2013-02-22 DIAGNOSIS — M1710 Unilateral primary osteoarthritis, unspecified knee: Secondary | ICD-10-CM | POA: Diagnosis not present

## 2013-02-22 DIAGNOSIS — M545 Low back pain: Secondary | ICD-10-CM | POA: Diagnosis not present

## 2013-02-25 DIAGNOSIS — M545 Low back pain: Secondary | ICD-10-CM | POA: Diagnosis not present

## 2013-02-27 DIAGNOSIS — M545 Low back pain: Secondary | ICD-10-CM | POA: Diagnosis not present

## 2013-03-05 DIAGNOSIS — M545 Low back pain: Secondary | ICD-10-CM | POA: Diagnosis not present

## 2013-03-08 DIAGNOSIS — M545 Low back pain: Secondary | ICD-10-CM | POA: Diagnosis not present

## 2013-03-08 DIAGNOSIS — R262 Difficulty in walking, not elsewhere classified: Secondary | ICD-10-CM | POA: Diagnosis not present

## 2013-03-11 ENCOUNTER — Encounter: Payer: Self-pay | Admitting: Cardiology

## 2013-03-11 DIAGNOSIS — R002 Palpitations: Secondary | ICD-10-CM | POA: Insufficient documentation

## 2013-03-11 DIAGNOSIS — R262 Difficulty in walking, not elsewhere classified: Secondary | ICD-10-CM | POA: Diagnosis not present

## 2013-03-11 DIAGNOSIS — I10 Essential (primary) hypertension: Secondary | ICD-10-CM | POA: Insufficient documentation

## 2013-03-11 DIAGNOSIS — M545 Low back pain: Secondary | ICD-10-CM | POA: Diagnosis not present

## 2013-03-11 DIAGNOSIS — E785 Hyperlipidemia, unspecified: Secondary | ICD-10-CM | POA: Insufficient documentation

## 2013-03-11 DIAGNOSIS — I219 Acute myocardial infarction, unspecified: Secondary | ICD-10-CM | POA: Insufficient documentation

## 2013-03-11 DIAGNOSIS — E669 Obesity, unspecified: Secondary | ICD-10-CM | POA: Insufficient documentation

## 2013-03-12 ENCOUNTER — Encounter: Payer: Self-pay | Admitting: Cardiology

## 2013-03-12 ENCOUNTER — Ambulatory Visit (INDEPENDENT_AMBULATORY_CARE_PROVIDER_SITE_OTHER): Payer: Medicare Other | Admitting: Cardiology

## 2013-03-12 VITALS — BP 128/70 | HR 66 | Ht 70.0 in | Wt 226.4 lb

## 2013-03-12 DIAGNOSIS — Z9861 Coronary angioplasty status: Secondary | ICD-10-CM

## 2013-03-12 DIAGNOSIS — I219 Acute myocardial infarction, unspecified: Secondary | ICD-10-CM

## 2013-03-12 DIAGNOSIS — E669 Obesity, unspecified: Secondary | ICD-10-CM

## 2013-03-12 DIAGNOSIS — E785 Hyperlipidemia, unspecified: Secondary | ICD-10-CM | POA: Diagnosis not present

## 2013-03-12 DIAGNOSIS — I2510 Atherosclerotic heart disease of native coronary artery without angina pectoris: Secondary | ICD-10-CM

## 2013-03-12 DIAGNOSIS — I10 Essential (primary) hypertension: Secondary | ICD-10-CM | POA: Diagnosis not present

## 2013-03-12 DIAGNOSIS — R002 Palpitations: Secondary | ICD-10-CM | POA: Diagnosis not present

## 2013-03-12 NOTE — Patient Instructions (Signed)
Things seem like they are doing well.   I am happy with your blood pressures. Your ECG looks great!  Your Echocardiogram showed normal Pump function with abnormal relaxation - which can explain the shortness of breath with strenuous exertion.  You are due to have your LIPIDS, & CHEMISTRY PANEL CHECKED .  Marykay Lex, MD  Your physician wants you to follow-up in: 6 monts. You will receive a reminder letter in the mail two months in advance. If you don't receive a letter, please call our office to schedule the follow-up appointment.

## 2013-03-12 NOTE — Progress Notes (Signed)
PATIENT: Jerry [name] MRN: [id number]  DOB: 1929/09/12   DOV:03/14/2013 PCP: Lupe Carney, MD  Clinic Note: Chief Complaint  Patient presents with  . 6 monh visit    no chest pain ,little sob, no edema   HPI: Jerry [name] is a 79 y.o. male with a PMH below who presents today for six-month followup of his coronary disease, labile blood pressure, edema and dyslipidemia..  I saw him in March of 2014, and ordered an echocardiogram to evaluate dyspnea. This showed a normal ejection fraction with grade 2 diastolic dysfunction and moderate concentric hypertrophy. It aortic sclerosis with no stenosis. I also stopped his aspirin as his P2Y12 in addition ratio was very low with Plavix. He was doing better with the HCTZ, stating that really did help his edema. He had done well with some weight loss.  Interval History: He presents today, but overall doing fairly well from cardiac standpoint. He says his dyspnea somewhat improved, as has his edema. He has been undergoing physical therapy for both his knee and back. He did relatively active, but is not really been "exercising". He thinks he may be putting a little weight, because of his decreased exercise. Would level of activity that he is able to do currently denies any chest impression rest exertion. No lightheadedness, dizziness or wooziness with rest exertion. No PND orthopnea or edema.  The remainder of Cardiovascular ROS: no chest pain or dyspnea on exertion with the exception on significant exertion. Improved edema negative for - chest pain, irregular heartbeat, loss of consciousness, murmur, orthopnea, palpitations, paroxysmal nocturnal dyspnea, rapid heart rate or shortness of breath: Additional cardiac review of systems: Lightheadedness - no, dizziness - no, syncope/near-syncope - no; TIA/amaurosis fugax - no Melena - no, hematochezia no; hematuria - no; nosebleeds - no; claudication - no   Past Medical History  Diagnosis Date  .  Hypertension     Labile -- takes Verapamil,Losartan,and Metoprolol daily   . Peripheral edema     takes HCTZ daily  . Hyperlipidemia     takes Lipitor nightly  . CAD S/P percutaneous coronary angioplasty 1996, 2009, 01/2010    PTCA of L Cx 1996; BMS-RCA [facility]) 2009; Staged PCI RCA (70% ISR) --> 3.0 mm x 23 mm BMS, staged PCI Diag - 2.0 mm x 12 mm Mini Vision BMS (2.25 mm)  . Myocardial infarction 1996, 2009    x 2;last time was about 8-63yrs ago   . Osteoarthritis of both knees   . Chronic back pain     scoliosis--pt states unable to have surgery bc of age  . GERD (gastroesophageal reflux disease)     takes Nexium daily  . History of colon polyps   . Urinary frequency     takes Vesicare and Flomax daily  . Urinary urgency   . Prostate cancer 2005    seed implant  . Cataracts, bilateral     immature  . Anxiety     takes Valium prn  . Bleeding nose     occasionally  . Obesity (BMI 30.0-34.9)     Last BMI ~31;   . OSA (obstructive sleep apnea)   . Heart palpitations     PACs, short bursts of PSVT   Prior Cardiac Evaluation and Past Surgical History: Past Surgical History  Procedure Laterality Date  . Right knee arthrsoscopy  89yrs ago  . Cyst removed  in college  . Coronary angioplasty  02/02/10    2 stents  . Wisdom teeth extraccted    .  Colonoscopy    . Seeds placed into prostate     . Cholecystectomy    . Neuroplasty / transposition median nerve at carpal tunnel bilateral    . Right trigger finger    . Total knee arthroplasty  05/09/2012    Procedure: TOTAL KNEE ARTHROPLASTY;  Surgeon: Nestor Lewandowsky, MD;  Location: [facility] OR;  Service: Orthopedics;  Laterality: Right;  . Transthoracic echocardiogram  09/26/2012    EF 55-60%. Grade 2 diastolic dysfunction with moderate concentric LVH. Aortic sclerosis.    Allergies  Allergen Reactions  . Crab [Shellfish Allergy]   . Hydrocodone Hives  . Adhesive [Tape] Rash    Current Outpatient Prescriptions  Medication Sig  Dispense Refill  . atorvastatin (LIPITOR) 40 MG tablet Take 40 mg by mouth daily.      . cholecalciferol (VITAMIN D) 1000 UNITS tablet Take 1,000 Units by mouth daily.      . clopidogrel (PLAVIX) 75 MG tablet Take 75 mg by mouth daily.       . diazepam (VALIUM) 2 MG tablet Take 2 mg by mouth as needed for anxiety.      [name] esomeprazole (NEXIUM) 40 MG capsule Take 40 mg by mouth daily before breakfast.      . hydrochlorothiazide (HYDRODIURIL) 12.5 MG tablet Take 12.5 mg by mouth daily.      [name] HYDROmorphone (DILAUDID) 2 MG tablet Take 2 mg by mouth every 4 (four) hours as needed for pain.      [name] losartan (COZAAR) 100 MG tablet Take 1 tablet (100 mg total) by mouth daily.  90 tablet  3  . meloxicam (MOBIC) 15 MG tablet Take 15 mg by mouth daily.      . metoprolol succinate (TOPROL-XL) 25 MG 24 hr tablet Take 12.5 mg by mouth daily.       . nitroGLYCERIN (NITROSTAT) 0.4 MG SL tablet Place 0.4 mg under the tongue every 5 (five) minutes as needed. For chest pain      . Polyethylene Glycol 3350 (MIRALAX PO) Take by mouth as needed.      . psyllium (METAMUCIL) 58.6 % powder Take 1 packet by mouth 3 (three) times daily.      . solifenacin (VESICARE) 10 MG tablet Take 10 mg by mouth daily.      . Tamsulosin HCl (FLOMAX) 0.4 MG CAPS Take 0.4 mg by mouth daily after supper.      . triamcinolone cream (KENALOG) 0.5 % Apply 1 application topically daily as needed.      . verapamil (CALAN-SR) 240 MG CR tablet Take 240 mg by mouth at bedtime.       No current facility-administered medications for this visit.    History   Social History Narrative   He is a married father of 2, grandfather of 2. He exercises at least 3 to 4 times a week but does something 6 to 7 days a week. He does not smoke. Drinks occasional alcohol.     ROS: A comprehensive Review of Systems - Negative except Mild positives in history of present illness above, and below. Musculoskeletal ROS: positive for - gait disturbance and With poor  balance  Significant knee and back discomfort for which he is undergoing physical therapy. Improved edema Neurological ROS: no TIA or stroke symptoms positive for - bowel and bladder control changes and gait disturbance  PHYSICAL EXAM BP 128/70  Pulse 66  Ht 5\' 10"  (1.778 m)  Wt 226 lb 6.4 oz (102.694 kg)  BMI 32.48  kg/m2 General appearance: alert, cooperative, appears stated age, no distress and pneumonia to moderately obese HEENT: [place]/AT, EOMI, MMM, anicteric sclera Neck: no adenopathy, no carotid bruit and no JVD Lungs: clear to auscultation bilaterally, normal percussion bilaterally and non-labored Heart: regular rate and rhythm, S1, S2 normal, no murmur, click, rub or gallop ; nondisplaced PMI Abdomen: soft, non-tender; bowel sounds normal; no masses,  no organomegaly; mildly obese Extremities: extremities normal, atraumatic, no cyanosis, and edema roughly 1+ up to 2 + in some spots. spots. Pulses: 2+ and symmetric; but mildly diminished in the legs. Neurologic: Mental status: Alert, oriented, thought content appropriate Cranial nerves: normal (II-XII grossly intact)   JXB:JYNWGNFAO today: Yes Rate: 66 , Rhythm: NSR, otherwise normal ECG.  Recent Labs: None present  ASSESSMENT / PLAN: CAD S/P percutaneous coronary angioplasty No active facial symptoms. He is not most active person, but seems to be stable at a relatively strong regimen. His has had less bleeding off of aspirin, and being only on the Clopidogrel  /Plavix alone.  Plan: Continue Plavix, ARB, on low-dose beta blocker along with verapamil for blood pressure and heart rate.  His echocardiogram was essentially normal, with the exception of diastolic dysfunction and hypertrophy. No real regional wall abnormalities noted.  Hypertension He actually brought in a booklet on almost daily recordings of his blood pressure and heart rate response. For the most part with blood pressures were roughly in the range of  130-150/70-90 mmHg.  Heart palpitations No recurrent symptoms on current dose of beta blocker + verapamil.  Hyperlipidemia She is not on any lipid-lowering medication.  Plan: Followup lipids and LFTs.  Obesity (BMI 30.0-34.9) He does claim to be having efforts in place to reduce his weight. He actually lost weight and he'll discuss recent visit. We again talked about the importance of dietary modifications and exercise.   Myocardial infarction No recurrent symptoms to suggest recurrent MI. His EF had notably improved by echocardiogram.    Orders Placed This Encounter  Procedures  . Lipid Profile  . Comp Met (CMET)  . EKG 12-Lead     Followup: 6 months   DAVID W. Herbie Baltimore, M.D., M.S. [facility] [address] [place], [place]  [address]  [phone] Pager # [phone]

## 2013-03-13 ENCOUNTER — Encounter: Payer: Self-pay | Admitting: Cardiology

## 2013-03-13 DIAGNOSIS — R262 Difficulty in walking, not elsewhere classified: Secondary | ICD-10-CM | POA: Diagnosis not present

## 2013-03-13 DIAGNOSIS — M1710 Unilateral primary osteoarthritis, unspecified knee: Secondary | ICD-10-CM | POA: Diagnosis not present

## 2013-03-13 DIAGNOSIS — M545 Low back pain: Secondary | ICD-10-CM | POA: Diagnosis not present

## 2013-03-14 ENCOUNTER — Encounter: Payer: Self-pay | Admitting: Cardiology

## 2013-03-14 NOTE — Assessment & Plan Note (Signed)
He actually brought in a booklet on almost daily recordings of his blood pressure and heart rate response. For the most part with blood pressures were roughly in the range of 130-150/70-90 mmHg.

## 2013-03-14 NOTE — Assessment & Plan Note (Addendum)
No recurrent symptoms on current dose of beta blocker + verapamil.

## 2013-03-14 NOTE — Assessment & Plan Note (Signed)
She is not on any lipid-lowering medication.  Plan: Followup lipids and LFTs.

## 2013-03-14 NOTE — Assessment & Plan Note (Signed)
He does claim to be having efforts in place to reduce his weight. He actually lost weight and he'll discuss recent visit. We again talked about the importance of dietary modifications and exercise.

## 2013-03-14 NOTE — Assessment & Plan Note (Addendum)
No active facial symptoms. He is not most active person, but seems to be stable at a relatively strong regimen. His has had less bleeding off of aspirin, and being only on the Clopidogrel  /Plavix alone.  Plan: Continue Plavix, ARB, on low-dose beta blocker along with verapamil for blood pressure and heart rate.  His echocardiogram was essentially normal, with the exception of diastolic dysfunction and hypertrophy. No real regional wall abnormalities noted.

## 2013-03-14 NOTE — Assessment & Plan Note (Signed)
No recurrent symptoms to suggest recurrent MI. His EF had notably improved by echocardiogram.

## 2013-03-20 DIAGNOSIS — E785 Hyperlipidemia, unspecified: Secondary | ICD-10-CM | POA: Diagnosis not present

## 2013-03-20 DIAGNOSIS — R002 Palpitations: Secondary | ICD-10-CM | POA: Diagnosis not present

## 2013-03-20 DIAGNOSIS — E669 Obesity, unspecified: Secondary | ICD-10-CM | POA: Diagnosis not present

## 2013-03-20 DIAGNOSIS — I10 Essential (primary) hypertension: Secondary | ICD-10-CM | POA: Diagnosis not present

## 2013-03-20 DIAGNOSIS — I2510 Atherosclerotic heart disease of native coronary artery without angina pectoris: Secondary | ICD-10-CM | POA: Diagnosis not present

## 2013-03-20 DIAGNOSIS — Z9861 Coronary angioplasty status: Secondary | ICD-10-CM | POA: Diagnosis not present

## 2013-03-20 LAB — LIPID PANEL
Cholesterol: 142 mg/dL (ref 0–200)
HDL: 45 mg/dL (ref >39)

## 2013-03-20 LAB — COMPREHENSIVE METABOLIC PANEL
Albumin: 4.3 g/dL (ref 3.5–5.2)
BUN: 24 mg/dL — ABNORMAL HIGH (ref 6–23)
CO2: 28 mEq/L (ref 19–32)
Glucose, Bld: 109 mg/dL — ABNORMAL HIGH (ref 70–99)
Potassium: 4.1 mEq/L (ref 3.5–5.3)
Sodium: 138 mEq/L (ref 135–145)
Total Protein: 6.6 g/dL (ref 6.0–8.3)

## 2013-03-27 ENCOUNTER — Encounter: Payer: Self-pay | Admitting: *Deleted

## 2013-03-27 ENCOUNTER — Telehealth: Payer: Self-pay | Admitting: *Deleted

## 2013-03-27 DIAGNOSIS — Z23 Encounter for immunization: Secondary | ICD-10-CM | POA: Diagnosis not present

## 2013-03-27 NOTE — Telephone Encounter (Signed)
Spoke to patient. Result given . Verbalized understanding Patient request a copy of labs mailed to him. He states he has not signed up for [facility] CHART

## 2013-03-27 NOTE — Telephone Encounter (Signed)
Message copied by Tobin Chad on Wed Mar 27, 2013  4:30 PM ------      Message from: Marykay Lex      Created: Wed Mar 20, 2013 10:23 PM       Very good cholesterol panel -- LDL is pretty close to goal at 76, the others are there.  Next set of labs will be NMR panel.            Glucose is a bit high.  Need to keep an eye on that.            Marykay Lex, MD       ------

## 2013-04-12 ENCOUNTER — Other Ambulatory Visit: Payer: Self-pay | Admitting: Cardiology

## 2013-04-12 NOTE — Telephone Encounter (Signed)
Rx was sent to pharmacy electronically. 

## 2013-04-18 DIAGNOSIS — S82009A Unspecified fracture of unspecified patella, initial encounter for closed fracture: Secondary | ICD-10-CM | POA: Diagnosis not present

## 2013-05-07 DIAGNOSIS — S82009A Unspecified fracture of unspecified patella, initial encounter for closed fracture: Secondary | ICD-10-CM | POA: Diagnosis not present

## 2013-05-10 DIAGNOSIS — H04129 Dry eye syndrome of unspecified lacrimal gland: Secondary | ICD-10-CM | POA: Diagnosis not present

## 2013-05-10 DIAGNOSIS — H0230 Blepharochalasis unspecified eye, unspecified eyelid: Secondary | ICD-10-CM | POA: Diagnosis not present

## 2013-05-10 DIAGNOSIS — H2510 Age-related nuclear cataract, unspecified eye: Secondary | ICD-10-CM | POA: Diagnosis not present

## 2013-05-10 DIAGNOSIS — H43819 Vitreous degeneration, unspecified eye: Secondary | ICD-10-CM | POA: Diagnosis not present

## 2013-06-11 DIAGNOSIS — M25569 Pain in unspecified knee: Secondary | ICD-10-CM | POA: Diagnosis not present

## 2013-07-13 ENCOUNTER — Other Ambulatory Visit: Payer: Self-pay | Admitting: Cardiology

## 2013-07-15 NOTE — Telephone Encounter (Signed)
Rx was sent to pharmacy electronically. 

## 2013-07-16 DIAGNOSIS — M25569 Pain in unspecified knee: Secondary | ICD-10-CM | POA: Diagnosis not present

## 2013-07-22 ENCOUNTER — Other Ambulatory Visit: Payer: Self-pay | Admitting: Cardiology

## 2013-07-26 ENCOUNTER — Other Ambulatory Visit: Payer: Self-pay | Admitting: *Deleted

## 2013-07-26 MED ORDER — VERAPAMIL HCL ER 240 MG PO TBCR: 240.0000 mg | EXTENDED_RELEASE_TABLET | Freq: Every day | ORAL | Status: DC

## 2013-07-26 NOTE — Telephone Encounter (Signed)
Cancelled [place]relan PM Order Verapamil CR -(Calan-SR) 240 MG PER ORDER

## 2013-07-31 ENCOUNTER — Telehealth: Payer: Self-pay | Admitting: Cardiology

## 2013-07-31 MED ORDER — VERAPAMIL HCL ER 240 MG PO CP24: 240.0000 mg | ORAL_CAPSULE | Freq: Every day | ORAL | Status: DC

## 2013-07-31 NOTE — Telephone Encounter (Signed)
Pt said he just talked to a nurse about his Verapamil,need to talk to her again please.

## 2013-07-31 NOTE — Telephone Encounter (Signed)
Per the answering service-[facility] have beeen tying to contact you about his  Verapamil 240 mg.

## 2013-07-31 NOTE — Telephone Encounter (Signed)
Called patient to let him know that medication was sent into mail order on 07/26/2013. Patient stated he had enough medication to last until medication arrives.

## 2013-07-31 NOTE — Telephone Encounter (Signed)
Returned call to patient.  Verapamil 240 mg-24 hour release-capsule sent into [facility], patient reassured of this. Patient also needed a short 10-day supply sent into [facility] - until patient received medication from [place].

## 2013-07-31 NOTE — Telephone Encounter (Signed)
Says he need to talk to the nurse today. His pharmacist need to talk to the doctor about his Verapamil.

## 2013-08-02 NOTE — Telephone Encounter (Signed)
See previous phone notes from same date.

## 2013-08-05 ENCOUNTER — Other Ambulatory Visit (HOSPITAL_COMMUNITY): Payer: Self-pay | Admitting: Cardiology

## 2013-08-05 NOTE — Telephone Encounter (Signed)
Rx was sent to pharmacy electronically. 

## 2013-08-07 ENCOUNTER — Telehealth: Payer: Self-pay | Admitting: Cardiology

## 2013-08-07 NOTE — Telephone Encounter (Signed)
Can stop 5 day pre-procedure & restart next 1-2 days after.  Leonie Man, MD

## 2013-08-07 NOTE — Telephone Encounter (Signed)
Forward to Dr Ellyn Hack  Question is from [place] office?

## 2013-08-07 NOTE — Telephone Encounter (Signed)
Pt need her tooth removed and tooth implanted the same day. Need to know if she need to stop her Plavix and if so how many days ahead?

## 2013-08-08 NOTE — Telephone Encounter (Signed)
Spoke to MICHELLE PER ORDER DR HAR HARDING  - CAN STOP 5 DAYS  PRE- PROCEDURE  AND RESTART  NEXT 1-2 DAYS AFTER  MICHELLE VERBALIZED  UNDERSTANDING

## 2013-08-12 ENCOUNTER — Ambulatory Visit (INDEPENDENT_AMBULATORY_CARE_PROVIDER_SITE_OTHER): Payer: Medicare Other | Admitting: Cardiology

## 2013-08-12 ENCOUNTER — Encounter: Payer: Self-pay | Admitting: Cardiology

## 2013-08-12 VITALS — BP 108/78 | HR 75 | Ht 71.0 in | Wt 229.8 lb

## 2013-08-12 DIAGNOSIS — Z79899 Other long term (current) drug therapy: Secondary | ICD-10-CM

## 2013-08-12 DIAGNOSIS — Z9861 Coronary angioplasty status: Secondary | ICD-10-CM

## 2013-08-12 DIAGNOSIS — I10 Essential (primary) hypertension: Secondary | ICD-10-CM

## 2013-08-12 DIAGNOSIS — I219 Acute myocardial infarction, unspecified: Secondary | ICD-10-CM

## 2013-08-12 DIAGNOSIS — E785 Hyperlipidemia, unspecified: Secondary | ICD-10-CM

## 2013-08-12 DIAGNOSIS — R002 Palpitations: Secondary | ICD-10-CM

## 2013-08-12 DIAGNOSIS — E669 Obesity, unspecified: Secondary | ICD-10-CM

## 2013-08-12 DIAGNOSIS — I2510 Atherosclerotic heart disease of native coronary artery without angina pectoris: Secondary | ICD-10-CM | POA: Diagnosis not present

## 2013-08-12 NOTE — Patient Instructions (Signed)
LABS IN 6 MONTH CMP,LIPIDS  IF YOUR SYMPTOMS STILL THERE IN SEVERAL MONTHS , CALL LET us KNOW POSSIBLE STRESS TEST WILL BE SCHEDULE  Your physician wants you to follow-up in [place] HARDING. You will receive a reminder letter in the mail two months in advance. If you don't receive a letter, please call our office to schedule the follow-up appointment.

## 2013-08-13 ENCOUNTER — Encounter: Payer: Self-pay | Admitting: Cardiology

## 2013-08-13 DIAGNOSIS — Z9861 Coronary angioplasty status: Principal | ICD-10-CM

## 2013-08-13 DIAGNOSIS — R0609 Other forms of dyspnea: Secondary | ICD-10-CM

## 2013-08-13 DIAGNOSIS — I2510 Atherosclerotic heart disease of native coronary artery without angina pectoris: Secondary | ICD-10-CM | POA: Insufficient documentation

## 2013-08-13 NOTE — Assessment & Plan Note (Signed)
Multiple bare-metal stents placed most recently in 2011. Due to GI intolerance to aspirin, therefore single therapy with Plavix. He remains on a beta blocker, ARB and statin.

## 2013-08-13 NOTE — Assessment & Plan Note (Signed)
Very distant history of MI (inferior and inferolateral infarct noted on Myoview) with PTCA and PCI to the past the workup a year and half ago. No active ischemic symptoms besides mild exertional dyspnea which I think is multifactorial and probably more related to deconditioning. He remains on a stable regimen.

## 2013-08-13 NOTE — Progress Notes (Signed)
PCP: [name], MD  Clinic Note: Chief Complaint  Patient presents with  . 6 MONTH VISIT    SINC LAST VISIT BROKE RIGHT KNEE CAP, NO CHEST PAIN , SOB , EDEMA LITTLE IN ANKLES   HPI: Jerry [name] is a 80 y.o. male with a Cardiovascular Problem List below who presents today for  Six-month followup of CAD.  Since I last saw him he has had a prolonged bout of recovery from a fall or he fractured the patella on the right knee that has been previously repaired. Unfortunately he is now finally starting to get back up and doing things from completing rehabilitation.  Interval History: He really notes today overall feeling tired and exertional fatigue. He really can't state whether this is in need hips are bothering him with his walking more so than his breathing but does not exertional dyspnea. He has some mild nausea and edema which is chronic but no significant edema or PND/orthopnea. No chest tightness or pressure with rest or exertion. Resting dyspnea. He only has musculoskeletal pains not claudication with ambulation. Intermittent palpitations, but denies lightheadedness, dizziness, weakness or syncope/near syncope. No TIA/amaurosis fugax symptoms. No melena, hematochezia hematuria.  Cardiovascular Problem List 1. CAD S/P percutaneous coronary angioplasty   2. Myocardial infarction; history of   3. Hyperlipidemia   4. Encounter for long-term (current) use of other medications   5. Hypertension   6. Heart palpitations   7. Obesity (BMI 30.0-34.9)    Past Cardiac Procedure History :  Procedure Laterality Date  . Coronary angioplasty with stent placement  1996, 2009, August 2011    PTCA of L Cx 1996; BMS-RCA [place] [facility]) 2009; Staged PCI RCA (70% ISR) --> 3.0 mm x 23 mm BMS, staged PCI Diag - 2.0 mm x 12 mm Mini Vision BMS (2.25 mm)  . Transthoracic echocardiogram  09/26/2012    EF 55-60%. Grade 2 diastolic dysfunction with moderate concentric LVH. Aortic sclerosis.  [name] Nm  myoview ltd  November 2013    EF 53%; fixed mid inferior-inferolateral defect, infarct with no ischemia.   PMH/PSH reviewed and updated in [place] [facility] No Change in Social and Family History  ROS: A comprehensive Review of Systems - Negative except Weight gain with lack of exertional activity appeared fatigued, knee pain. Gen. deconditioning.  Otherwise negative.  PHYSICAL EXAM BP 108/78  Pulse 75  Ht 5\' 11"  (1.803 m)  Wt 229 lb 12.8 oz (104.237 kg)  BMI 32.06 kg/m2 Filed Weights    08/12/13 1546  03/14/2013   Weight: 229 lb 12.8 oz (104.237 kg)  226  lb  General appearance: alert, cooperative, appears stated age, no distress; mild to moderately obese  HEENT: [place]/AT, EOMI, MMM, anicteric sclera  Neck: no adenopathy, no carotid bruit and no JVD  Lungs: CTA B., normal percussion bilaterally and non-labored  Heart: RRR, S1, S2 normal, no murmur, click, rub or gallop ; nondisplaced PMI  Abdomen: soft, non-tender; bowel sounds normal; no masses, no organomegaly; mildly obese (truncal) Extremities: extremities normal, atraumatic, no cyanosis, and edema roughly 1+ up to 2 + in some spots. spots.  Pulses: 2+ and symmetric; but mildly diminished in the legs.  Neurologic: Mental status: Alert, oriented X 3, thought content appropriate ; Cranial nerves: normal (II-XII grossly intact  QPY:[id number] today: Yes Rate:75 , Rhythm:  NSR; normal EKG.  Recent Labs: none since October 2014   Due to have labs checked by PCP this next month or so.  ASSESSMENT / PLAN: Myocardial infarction; history of Very distant history of MI (inferior and inferolateral infarct noted on Myoview) with PTCA and PCI to the past the workup a year and half ago. No active ischemic symptoms besides mild exertional dyspnea which I think is multifactorial and probably more related to deconditioning. He remains on a stable regimen.  CAD S/P percutaneous coronary angioplasty Multiple  bare-metal stents placed most recently in 2011. Due to GI intolerance to aspirin, therefore single therapy with Plavix. He remains on a beta blocker, ARB and statin.   Exertional dyspnea and fatigue Probably multifactorial. I think he is very deconditioned having just now gotten over his patella fracture. With his age and obesity, he does not have the same resiliency in the past. Hopefully with him getting back into doing some exercise this will be come less pronounced. However I would like to check back in with him in roughly 3 months to see if not, I would asked that he contact us and let us know things are not improving and therefore we will probably proceed with checking another Myoview just to ensure that there is no ischemic component.  Hypertension Excellent control. Continue current regimen for now.  Heart palpitations He has been relatively well-controlled accommodation beta blocker plus verapamil. We'll continue current regimen provided there is no profound bradycardia. Is not on any significant beta blocker dose to suggest that the beta blocker may be causing fatigue.  Hyperlipidemia He is now on Lipitor 40 mg daily. Last set of labs were from October 2014 which showed LDL of 76, HDL 45 the total cholesterol 142 and triglycerides of 105.  I would like to see stability of his labs. I will be in order another set of labs including lipids and CMP to be drawn prior to and following up with his PCP in the next month or so.  Obesity (BMI 30.0-34.9) Hopefully with him being ill he get into some more physical activity which after recovering from the fracture, he would. Lose weight. We talked about dietary modification and that in a more heart healthy diet such as the Mediterranean or DASH diet.    Orders Placed This Encounter  Procedures  . Comprehensive metabolic panel    Standing Status: Future     Number of Occurrences:      Standing Expiration Date: 08/13/2014    Order Specific  Question:  Has the patient fasted?    Answer:  Yes  . Lipid panel    Standing Status: Future     Number of Occurrences:      Standing Expiration Date: 08/13/2014    Order Specific Question:  Has the patient fasted?    Answer:  Yes  . EKG 12-Lead    Order Specific Question:  Where should this test be performed    Answer:  OTHER    Followup: 6 months  DAVID W. Ellyn Hack, M.D., M.S. Interventional Cardiologist [facility]

## 2013-08-14 NOTE — Assessment & Plan Note (Addendum)
Probably multifactorial. I think he is very deconditioned having just now gotten over his patella fracture. With his age and obesity, he does not have the same resiliency in the past. Hopefully with him getting back into doing some exercise this will be come less pronounced. However I would like to check back in with him in roughly 3 months to see if not, I would asked that he contact us and let us know things are not improving and therefore we will probably proceed with checking another Myoview just to ensure that there is no ischemic component.

## 2013-08-14 NOTE — Assessment & Plan Note (Signed)
Excellent control. Continue current regimen for now.

## 2013-08-14 NOTE — Assessment & Plan Note (Addendum)
He is now on Lipitor 40 mg daily. Last set of labs were from October 2014 which showed LDL of 76, HDL 45 the total cholesterol 142 and triglycerides of 105.  I would like to see stability of his labs. I will be in order another set of labs including lipids and CMP to be drawn prior to and following up with his PCP in the next month or so.

## 2013-08-14 NOTE — Assessment & Plan Note (Signed)
He has been relatively well-controlled accommodation beta blocker plus verapamil. We'll continue current regimen provided there is no profound bradycardia. Is not on any significant beta blocker dose to suggest that the beta blocker may be causing fatigue.

## 2013-08-14 NOTE — Assessment & Plan Note (Signed)
Hopefully with him being ill he get into some more physical activity which after recovering from the fracture, he would. Lose weight. We talked about dietary modification and that in a more heart healthy diet such as the Mediterranean or DASH diet.

## 2013-08-20 DIAGNOSIS — M545 Low back pain, unspecified: Secondary | ICD-10-CM | POA: Diagnosis not present

## 2013-08-23 DIAGNOSIS — M545 Low back pain, unspecified: Secondary | ICD-10-CM | POA: Diagnosis not present

## 2013-08-28 DIAGNOSIS — M545 Low back pain, unspecified: Secondary | ICD-10-CM | POA: Diagnosis not present

## 2013-09-03 DIAGNOSIS — M25569 Pain in unspecified knee: Secondary | ICD-10-CM | POA: Diagnosis not present

## 2013-09-03 DIAGNOSIS — M545 Low back pain, unspecified: Secondary | ICD-10-CM | POA: Diagnosis not present

## 2013-09-05 DIAGNOSIS — M545 Low back pain, unspecified: Secondary | ICD-10-CM | POA: Diagnosis not present

## 2013-09-11 DIAGNOSIS — M545 Low back pain, unspecified: Secondary | ICD-10-CM | POA: Diagnosis not present

## 2013-09-18 DIAGNOSIS — M545 Low back pain, unspecified: Secondary | ICD-10-CM | POA: Diagnosis not present

## 2013-09-19 ENCOUNTER — Ambulatory Visit: Payer: Medicare Other | Admitting: Cardiology

## 2013-09-19 DIAGNOSIS — N138 Other obstructive and reflux uropathy: Secondary | ICD-10-CM | POA: Diagnosis not present

## 2013-09-19 DIAGNOSIS — N139 Obstructive and reflux uropathy, unspecified: Secondary | ICD-10-CM | POA: Diagnosis not present

## 2013-09-19 DIAGNOSIS — N401 Enlarged prostate with lower urinary tract symptoms: Secondary | ICD-10-CM | POA: Diagnosis not present

## 2013-09-22 ENCOUNTER — Other Ambulatory Visit: Payer: Self-pay | Admitting: Cardiology

## 2013-09-23 DIAGNOSIS — M545 Low back pain, unspecified: Secondary | ICD-10-CM | POA: Diagnosis not present

## 2013-09-23 NOTE — Telephone Encounter (Signed)
Rx was sent to pharmacy electronically. 

## 2013-09-25 DIAGNOSIS — M545 Low back pain, unspecified: Secondary | ICD-10-CM | POA: Diagnosis not present

## 2013-09-26 DIAGNOSIS — N4340 Spermatocele of epididymis, unspecified: Secondary | ICD-10-CM | POA: Diagnosis not present

## 2013-09-26 DIAGNOSIS — C61 Malignant neoplasm of prostate: Secondary | ICD-10-CM | POA: Diagnosis not present

## 2013-10-01 DIAGNOSIS — M545 Low back pain, unspecified: Secondary | ICD-10-CM | POA: Diagnosis not present

## 2013-10-01 DIAGNOSIS — M25569 Pain in unspecified knee: Secondary | ICD-10-CM | POA: Diagnosis not present

## 2013-10-03 DIAGNOSIS — M545 Low back pain, unspecified: Secondary | ICD-10-CM | POA: Diagnosis not present

## 2013-10-07 ENCOUNTER — Other Ambulatory Visit: Payer: Self-pay | Admitting: Cardiology

## 2013-10-07 DIAGNOSIS — M545 Low back pain, unspecified: Secondary | ICD-10-CM | POA: Diagnosis not present

## 2013-10-07 NOTE — Telephone Encounter (Signed)
Rx refill sent to patient pharmacy   

## 2013-10-14 DIAGNOSIS — M545 Low back pain, unspecified: Secondary | ICD-10-CM | POA: Diagnosis not present

## 2013-10-16 DIAGNOSIS — M545 Low back pain, unspecified: Secondary | ICD-10-CM | POA: Diagnosis not present

## 2013-10-23 DIAGNOSIS — M545 Low back pain, unspecified: Secondary | ICD-10-CM | POA: Diagnosis not present

## 2013-10-25 DIAGNOSIS — M545 Low back pain, unspecified: Secondary | ICD-10-CM | POA: Diagnosis not present

## 2013-10-25 DIAGNOSIS — M25569 Pain in unspecified knee: Secondary | ICD-10-CM | POA: Diagnosis not present

## 2013-11-05 DIAGNOSIS — M7520 Bicipital tendinitis, unspecified shoulder: Secondary | ICD-10-CM | POA: Diagnosis not present

## 2013-12-03 ENCOUNTER — Telehealth: Payer: Self-pay | Admitting: Cardiology

## 2013-12-04 NOTE — Telephone Encounter (Signed)
Closed encounter °

## 2014-01-15 ENCOUNTER — Telehealth: Payer: Self-pay | Admitting: *Deleted

## 2014-01-15 DIAGNOSIS — Z79899 Other long term (current) drug therapy: Secondary | ICD-10-CM

## 2014-01-15 DIAGNOSIS — E785 Hyperlipidemia, unspecified: Secondary | ICD-10-CM

## 2014-01-15 NOTE — Telephone Encounter (Signed)
Message copied by Raiford Simmonds on Wed Jan 15, 2014  9:47 AM ------      Message from: [place], [place]ke: Mon Aug 12, 2013  4:45 PM       LABS CMP,LIPIDS            MAIL IN AUG 2015  ------

## 2014-01-15 NOTE — Telephone Encounter (Signed)
Mail letter and labslip cmp ,lipid 

## 2014-02-12 DIAGNOSIS — Z0000 Encounter for general adult medical examination without abnormal findings: Secondary | ICD-10-CM | POA: Diagnosis not present

## 2014-02-12 DIAGNOSIS — I10 Essential (primary) hypertension: Secondary | ICD-10-CM | POA: Diagnosis not present

## 2014-02-12 DIAGNOSIS — Z79899 Other long term (current) drug therapy: Secondary | ICD-10-CM | POA: Diagnosis not present

## 2014-02-12 DIAGNOSIS — E785 Hyperlipidemia, unspecified: Secondary | ICD-10-CM | POA: Diagnosis not present

## 2014-02-12 DIAGNOSIS — E782 Mixed hyperlipidemia: Secondary | ICD-10-CM | POA: Diagnosis not present

## 2014-02-12 DIAGNOSIS — M159 Polyosteoarthritis, unspecified: Secondary | ICD-10-CM | POA: Diagnosis not present

## 2014-02-12 DIAGNOSIS — Z23 Encounter for immunization: Secondary | ICD-10-CM | POA: Diagnosis not present

## 2014-02-12 LAB — LIPID PANEL
CHOL/HDL RATIO: 2.9 ratio
CHOLESTEROL: 143 mg/dL (ref 0–200)
HDL: 49 mg/dL (ref >39)
LDL Cholesterol: 73 mg/dL (ref 0–99)
TRIGLYCERIDES: 103 mg/dL (ref <150)
VLDL: 21 mg/dL (ref 0–40)

## 2014-02-12 LAB — COMPREHENSIVE METABOLIC PANEL
ALT: <8 U/L (ref 0–53)
AST: 10 U/L (ref 0–37)
Albumin: 4 g/dL (ref 3.5–5.2)
Alkaline Phosphatase: 59 U/L (ref 39–117)
BILIRUBIN TOTAL: 0.6 mg/dL (ref 0.2–1.2)
BUN: 19 mg/dL (ref 6–23)
CALCIUM: 9.1 mg/dL (ref 8.4–10.5)
CHLORIDE: 103 meq/L (ref 96–112)
CO2: 27 meq/L (ref 19–32)
CREATININE: 0.95 mg/dL (ref 0.50–1.35)
GLUCOSE: 105 mg/dL — AB (ref 70–99)
Potassium: 4 mEq/L (ref 3.5–5.3)
Sodium: 141 mEq/L (ref 135–145)
Total Protein: 6.5 g/dL (ref 6.0–8.3)

## 2014-02-17 ENCOUNTER — Encounter: Payer: Self-pay | Admitting: Cardiology

## 2014-02-17 ENCOUNTER — Ambulatory Visit (INDEPENDENT_AMBULATORY_CARE_PROVIDER_SITE_OTHER): Payer: Medicare Other | Admitting: Cardiology

## 2014-02-17 ENCOUNTER — Encounter: Payer: Self-pay | Admitting: *Deleted

## 2014-02-17 VITALS — BP 112/80 | HR 65 | Ht 68.5 in | Wt 222.6 lb

## 2014-02-17 DIAGNOSIS — R0609 Other forms of dyspnea: Secondary | ICD-10-CM | POA: Diagnosis not present

## 2014-02-17 DIAGNOSIS — I10 Essential (primary) hypertension: Secondary | ICD-10-CM | POA: Diagnosis not present

## 2014-02-17 DIAGNOSIS — Z9861 Coronary angioplasty status: Secondary | ICD-10-CM | POA: Diagnosis not present

## 2014-02-17 DIAGNOSIS — R0989 Other specified symptoms and signs involving the circulatory and respiratory systems: Secondary | ICD-10-CM | POA: Diagnosis not present

## 2014-02-17 DIAGNOSIS — E785 Hyperlipidemia, unspecified: Secondary | ICD-10-CM

## 2014-02-17 DIAGNOSIS — E669 Obesity, unspecified: Secondary | ICD-10-CM

## 2014-02-17 DIAGNOSIS — I2510 Atherosclerotic heart disease of native coronary artery without angina pectoris: Secondary | ICD-10-CM

## 2014-02-17 NOTE — Patient Instructions (Signed)
Your physician recommends that you schedule a follow-up appointment in: 6 Months  

## 2014-02-19 NOTE — Assessment & Plan Note (Signed)
Pretty much stable from cardiac standpoint. He has not voided yet with his exertional dyspnea the is to chase after this and stress test. He attributed more to being deconditioned and actual anginal symptoms. He is on single therapy with Plavix along with ARB beta blocker as well as calcium channel blocker verapamil. He takes atorvastatin

## 2014-02-19 NOTE — Assessment & Plan Note (Signed)
Again, I think this is multifactorial. He is being quite sedentary, and is obese. It is probably more deconditioning than an anginal component. If symptoms progress, then we would want to consider ischemic evaluation based on his history.

## 2014-02-19 NOTE — Assessment & Plan Note (Signed)
We will control. My recommendation for next year would be to check and NMR panel to confirm screening values.

## 2014-02-19 NOTE — Progress Notes (Signed)
PCP: [name], MD  Clinic Note: Chief Complaint  Patient presents with  . 6 MONTH VISIT    NO CHEST PAIN ,  SOB , NO EDEMA, HAVE HAS NOSE BLEED x3, TOOTH IMPLANT STILL IN PROCESS    HPI: Jerry [name] is a 80 y.o. male with a Cardiovascular Problem List below who presents today for  Six-month followup of CAD.    Interval History: Today he seems again ruled in for fatigue, tired but overall stable. He says he still gets short of breath going up a hill, or carrying things up stairs. He thinks it may be little worse than it was before but nothing that he wants to go chasing after a period.  He thinks is probably more of deconditioning and discomfort from arthritis pains than true dyspnea. Does not have any chest tightness or pressure associated with it. He is trying to do more activity but is very limited. He denies any resting dyspnea or chest tightness. No PND or orthopnea but does have intermittent lower extremity edema. No dullness or aching or restless leg symptoms. No claudication, but he does have more musculoskeletal symptoms with ambulation.  He denies any lightheadedness, dizziness, wooziness, syncope/near syncope or TIA/amaurosis fugax.   Cardiovascular Problem List 1. CAD S/P percutaneous coronary angioplasty   2. Essential hypertension   3. Hyperlipidemia with target LDL less than 70   4. Exertional dyspnea and fatigue   5. Obesity (BMI 30.0-34.9)    Past Cardiac Procedure History :  Procedure Laterality Date  . Coronary angioplasty with stent placement  1996, 2009, August 2011    PTCA of L Cx 1996; BMS-RCA [facility]) 2009; Staged PCI RCA (70% ISR) --> 3.0 mm x 23 mm BMS, staged PCI Diag - 2.0 mm x 12 mm Mini Vision BMS (2.25 mm)  . Transthoracic echocardiogram  09/26/2012    EF 55-60%. Grade 2 diastolic dysfunction with moderate concentric LVH. Aortic sclerosis.  [name] Nm myoview ltd  November 2013    EF 53%; fixed mid inferior-inferolateral defect, infarct with no  ischemia.   PMH/PSH reviewed and updated in [place] [facility] No Change in Social and Family History  ROS: A comprehensive Review of Systems - Negative except Weight gain with lack of exertional activity appeared fatigued, knee pain. Gen. deconditioning.  Otherwise negative. Review of Systems  Constitutional:       Tired, but not fatigue or malaise.  HENT: Negative for nosebleeds.   Eyes: Negative for blurred vision and double vision.  Respiratory: Negative for cough, hemoptysis, sputum production, shortness of breath and wheezing.   Cardiovascular: Negative.        Trivial edema  Gastrointestinal: Negative for blood in stool and melena.  Genitourinary: Negative for hematuria.  Musculoskeletal: Positive for back pain and joint pain. Negative for falls.       Knees and hips.  Neurological: Negative for dizziness, tingling, sensory change, speech change, focal weakness, seizures and loss of consciousness.  Endo/Heme/Allergies: Bruises/bleeds easily.  Psychiatric/Behavioral: Negative.  Negative for depression. The patient is not nervous/anxious.   All other systems reviewed and are negative.  Wt Readings from Last 3 Encounters:  02/17/14 222 lb 9.6 oz (100.971 kg)  08/12/13 229 lb 12.8 oz (104.237 kg)  03/12/13 226 lb 6.4 oz (102.694 kg)    PHYSICAL EXAM BP 112/80  Pulse 65  Ht 5' 8.5" (1.74 m)  Wt 222 lb 9.6 oz (100.971 kg)  BMI 33.35 kg/m2 General appearance: alert, cooperative,  appears stated age, no distress; mild to moderately obese  HEENT: [place]/AT, EOMI, MMM, anicteric sclera  Neck: no adenopathy, no carotid bruit and no JVD  Lungs: CTA B., normal percussion bilaterally and non-labored  Heart: RRR, S1, S2 normal, no murmur, click, rub or gallop ; nondisplaced PMI  Abdomen: soft, non-tender; bowel sounds normal; no masses, no organomegaly; mildly obese (truncal) Extremities: extremities normal, atraumatic, no cyanosis, and edema roughly 1+ up to  2 + in some spots. spots.  Pulses: 2+ and symmetric; but mildly diminished in the legs.  Neurologic: Mental status: Alert, oriented X 3, thought content appropriate ; Cranial nerves: normal (II-XII grossly intact  [id number] today: Yes Rate:65 , Rhythm:  NSR with PVCs, but otherwise normal EKG.  Recent Labs:  02/12/2014  TC 143, TG 103, HDL 49, HDL 73 -- pretty much @ goal  CMP - normal, except Glucose 105  ASSESSMENT / PLAN: CAD S/P percutaneous coronary angioplasty Pretty much stable from cardiac standpoint. He has not voided yet with his exertional dyspnea the is to chase after this and stress test. He attributed more to being deconditioned and actual anginal symptoms. He is on single therapy with Plavix along with ARB beta blocker as well as calcium channel blocker verapamil. He takes atorvastatin  Essential hypertension Excellent control with current medications. He also has a diuretic, low dose HCTZ. This is actually helping with his edema. He has been labile in the past, this is the best I've seen his pressures.  Hyperlipidemia with target LDL less than 70 We will control. My recommendation for next year would be to check and NMR panel to confirm screening values.  Exertional dyspnea and fatigue Again, I think this is multifactorial. He is being quite sedentary, and is obese. It is probably more deconditioning than an anginal component. If symptoms progress, then we would want to consider ischemic evaluation based on his history.  Obesity (BMI 30.0-34.9) He is trying to get back in exertional activity, but would his knee surgery patellar fracture really been limited and does not no acute up with reducing his dietary intake. We talked again about adjusting his diet and really try to get into doing some activity even with his riding bicycle or water aerobics.    No orders of the defined types were placed in this encounter.    Followup: 6 months  Lynnzie Blackson W. Ellyn Hack, M.D.,  M.S. Interventional Cardiologist [facility]

## 2014-02-19 NOTE — Assessment & Plan Note (Signed)
Excellent control with current medications. He also has a diuretic, low dose HCTZ. This is actually helping with his edema. He has been labile in the past, this is the best I've seen his pressures.

## 2014-02-19 NOTE — Assessment & Plan Note (Signed)
He is trying to get back in exertional activity, but would his knee surgery patellar fracture really been limited and does not no acute up with reducing his dietary intake. We talked again about adjusting his diet and really try to get into doing some activity even with his riding bicycle or water aerobics.

## 2014-03-14 DIAGNOSIS — H1132 Conjunctival hemorrhage, left eye: Secondary | ICD-10-CM | POA: Diagnosis not present

## 2014-03-19 ENCOUNTER — Other Ambulatory Visit: Payer: Self-pay | Admitting: Cardiology

## 2014-03-19 NOTE — Telephone Encounter (Signed)
Rx was sent to pharmacy electronically. 

## 2014-03-26 ENCOUNTER — Other Ambulatory Visit: Payer: Self-pay | Admitting: Cardiology

## 2014-03-26 NOTE — Telephone Encounter (Signed)
Rx was sent to pharmacy electronically. 

## 2014-04-15 ENCOUNTER — Other Ambulatory Visit: Payer: Self-pay | Admitting: Cardiology

## 2014-05-23 ENCOUNTER — Telehealth: Payer: Self-pay | Admitting: Cardiology

## 2014-05-23 DIAGNOSIS — I10 Essential (primary) hypertension: Secondary | ICD-10-CM

## 2014-05-23 DIAGNOSIS — Z79899 Other long term (current) drug therapy: Secondary | ICD-10-CM

## 2014-05-23 DIAGNOSIS — E669 Obesity, unspecified: Secondary | ICD-10-CM

## 2014-05-23 DIAGNOSIS — E785 Hyperlipidemia, unspecified: Secondary | ICD-10-CM

## 2014-05-23 NOTE — Telephone Encounter (Signed)
Spoke to patient RN reviewed last office note. Will order NMR and CMP.  Informed patient will mail lab slip in feb 2016. Patient verbalized understanding

## 2014-05-23 NOTE — Telephone Encounter (Signed)
Pt called in wanting to know if he needs to have any labs done before coming in to see [place] in March. Please call  Thanks

## 2014-06-07 ENCOUNTER — Other Ambulatory Visit: Payer: Self-pay | Admitting: Cardiology

## 2014-06-09 NOTE — Telephone Encounter (Signed)
Rx refill sent to patient pharmacy   

## 2014-06-11 ENCOUNTER — Other Ambulatory Visit: Payer: Self-pay | Admitting: Cardiology

## 2014-06-11 NOTE — Telephone Encounter (Signed)
Rx(s) sent to pharmacy electronically.  

## 2014-06-13 ENCOUNTER — Telehealth: Payer: Self-pay | Admitting: Cardiology

## 2014-06-13 NOTE — Telephone Encounter (Signed)
Pt just received a box of Tangelos. He wants to know if he can eat them,since it is a cross between a grapefruit and tangerine.He was afraid of how this will interact with his medicine.

## 2014-06-13 NOTE — Telephone Encounter (Signed)
RN spoke to [place]-  [employer], no problem eating tangelo ,it is only actual grapefruit. RN called and spoke to patient. Information given. Patient verbalized understanding

## 2014-06-13 NOTE — Telephone Encounter (Signed)
Forward to pharmacist-Kristin  awaiting answer will call patient back

## 2014-07-15 ENCOUNTER — Telehealth: Payer: Self-pay | Admitting: *Deleted

## 2014-07-15 DIAGNOSIS — E669 Obesity, unspecified: Secondary | ICD-10-CM

## 2014-07-15 DIAGNOSIS — E785 Hyperlipidemia, unspecified: Secondary | ICD-10-CM

## 2014-07-15 DIAGNOSIS — I10 Essential (primary) hypertension: Secondary | ICD-10-CM

## 2014-07-15 DIAGNOSIS — Z79899 Other long term (current) drug therapy: Secondary | ICD-10-CM

## 2014-07-15 NOTE — Telephone Encounter (Signed)
Mail letter and labslip- nmr ,cmp

## 2014-07-15 NOTE — Telephone Encounter (Signed)
-----   Message from Raiford Simmonds, RN sent at 05/23/2014 12:08 PM EST ----- Mail lab slip nmr cmp in feb 2016

## 2014-07-22 ENCOUNTER — Telehealth: Payer: Self-pay | Admitting: Cardiology

## 2014-07-22 NOTE — Telephone Encounter (Signed)
Called, NA. Sharon mailed slips last week.

## 2014-07-22 NOTE — Telephone Encounter (Signed)
Pt called in stating that he lost his lab order slips and he would need some more mailed to him along with any instructions.   Thanks

## 2014-07-23 NOTE — Telephone Encounter (Signed)
Return call- left message will call back

## 2014-07-23 NOTE — Telephone Encounter (Signed)
Left message to call back  

## 2014-07-23 NOTE — Telephone Encounter (Signed)
Spoke to patient . Informed patient he does not need a labslip for [facility]- able to see order in computer Patient aware.

## 2014-07-23 NOTE — Telephone Encounter (Signed)
Returning your call. °

## 2014-07-29 DIAGNOSIS — Z79899 Other long term (current) drug therapy: Secondary | ICD-10-CM | POA: Diagnosis not present

## 2014-07-29 DIAGNOSIS — I10 Essential (primary) hypertension: Secondary | ICD-10-CM | POA: Diagnosis not present

## 2014-07-29 DIAGNOSIS — E669 Obesity, unspecified: Secondary | ICD-10-CM | POA: Diagnosis not present

## 2014-07-29 DIAGNOSIS — E785 Hyperlipidemia, unspecified: Secondary | ICD-10-CM | POA: Diagnosis not present

## 2014-07-29 LAB — COMPREHENSIVE METABOLIC PANEL
ALBUMIN: 3.9 g/dL (ref 3.5–5.2)
AST: 13 U/L (ref 0–37)
Alkaline Phosphatase: 65 U/L (ref 39–117)
BILIRUBIN TOTAL: 1 mg/dL (ref 0.2–1.2)
BUN: 20 mg/dL (ref 6–23)
CO2: 26 meq/L (ref 19–32)
CREATININE: 0.93 mg/dL (ref 0.50–1.35)
Calcium: 8.9 mg/dL (ref 8.4–10.5)
Chloride: 104 mEq/L (ref 96–112)
Glucose, Bld: 93 mg/dL (ref 70–99)
Potassium: 3.4 mEq/L — ABNORMAL LOW (ref 3.5–5.3)
Sodium: 140 mEq/L (ref 135–145)
TOTAL PROTEIN: 6.6 g/dL (ref 6.0–8.3)

## 2014-07-31 ENCOUNTER — Telehealth: Payer: Self-pay | Admitting: *Deleted

## 2014-07-31 LAB — NMR LIPOPROFILE WITH LIPIDS
Cholesterol, Total: 155 mg/dL (ref 100–199)
HDL Particle Number: 28 umol/L — ABNORMAL LOW (ref >=30.5)
HDL SIZE: 9.3 nm (ref >=9.2)
HDL-C: 51 mg/dL (ref >39)
LDL (calc): 83 mg/dL (ref 0–99)
LDL PARTICLE NUMBER: 892 nmol/L (ref <1000)
LDL SIZE: 21.1 nm (ref >=20.8)
Large HDL-P: 6.5 umol/L (ref >=4.8)
Large VLDL-P: 1.1 nmol/L (ref <=2.7)
Small LDL Particle Number: 139 nmol/L (ref <=527)
Triglycerides: 105 mg/dL (ref 0–149)
VLDL Size: 41.5 nm (ref <=46.6)

## 2014-07-31 NOTE — Telephone Encounter (Signed)
-----   Message from Leonie Man, MD sent at 07/31/2014 10:20 AM EST ----- Chem panel looks of - just that Potassium is a bit low -- Would increase intake of Bananas, potatoes. Tomatoes  That are natural sources of potassium.  [place]

## 2014-07-31 NOTE — Telephone Encounter (Signed)
Spoke to patient. CMP Result given.Eat increase potassium enrich food . Verbal ized understanding

## 2014-08-18 ENCOUNTER — Ambulatory Visit (INDEPENDENT_AMBULATORY_CARE_PROVIDER_SITE_OTHER): Payer: Medicare Other | Admitting: Cardiology

## 2014-08-18 ENCOUNTER — Encounter: Payer: Self-pay | Admitting: Cardiology

## 2014-08-18 VITALS — BP 98/70 | HR 79 | Ht 71.0 in | Wt 221.1 lb

## 2014-08-18 DIAGNOSIS — Z79899 Other long term (current) drug therapy: Secondary | ICD-10-CM

## 2014-08-18 DIAGNOSIS — E669 Obesity, unspecified: Secondary | ICD-10-CM

## 2014-08-18 DIAGNOSIS — E785 Hyperlipidemia, unspecified: Secondary | ICD-10-CM | POA: Diagnosis not present

## 2014-08-18 DIAGNOSIS — R002 Palpitations: Secondary | ICD-10-CM | POA: Diagnosis not present

## 2014-08-18 DIAGNOSIS — I10 Essential (primary) hypertension: Secondary | ICD-10-CM

## 2014-08-18 DIAGNOSIS — R0609 Other forms of dyspnea: Secondary | ICD-10-CM | POA: Diagnosis not present

## 2014-08-18 DIAGNOSIS — I2510 Atherosclerotic heart disease of native coronary artery without angina pectoris: Secondary | ICD-10-CM

## 2014-08-18 DIAGNOSIS — Z9861 Coronary angioplasty status: Secondary | ICD-10-CM | POA: Diagnosis not present

## 2014-08-18 NOTE — Patient Instructions (Signed)
LABS IN 12 MONTHS - LIPID,CMP,----will mail labslip in 10 -11 months for you to do.  No change in medication  Your physician wants you to follow-up in 12 MONTHS Dr Ellyn Hack.  You will receive a reminder letter in the mail two months in advance. If you don't receive a letter, please call our office to schedule the follow-up appointment.

## 2014-08-19 ENCOUNTER — Other Ambulatory Visit: Payer: Self-pay | Admitting: Cardiology

## 2014-08-19 NOTE — Telephone Encounter (Signed)
Rx(s) sent to pharmacy electronically.  

## 2014-08-20 ENCOUNTER — Encounter: Payer: Self-pay | Admitting: Cardiology

## 2014-08-20 NOTE — Assessment & Plan Note (Signed)
Overall stable from a cardiac standpoint with no active angina symptoms. He does have exertional dyspnea, but is not convinced it is related to cardiac etiology as he is quite deconditioned. At this point he still remains reluctant to pursue stress test evaluation.  Plan: continue secondary prevention with Plavix and statin.  oon stable dose of beta blocker and ARB.

## 2014-08-20 NOTE — Assessment & Plan Note (Signed)
Your [place] hypotensive the day. He does have labile pressures. He is on a lot of medications and I reminded him that if he does feel lightheaded or dizzy he can hold his HCTZ or verapamil dose is due otherwise he can cut his losartan dose in half. However for the does not adjust the beta blocker dose.

## 2014-08-20 NOTE — Progress Notes (Signed)
PCP: [name], MD  Clinic Note: Chief Complaint  Patient presents with  . Coronary Artery Disease    follow up lab -NMR,CMPresults,no chest pain , sob, slight swelling   HPI: Jerry [name] is a 81 y.o. male with a PMH below who presents today for 6000 CAD risk factors. He has chronic exertional dyspnea and fatigue.  Past Medical History  Diagnosis Date  . Hypertension     Labile -- takes Verapamil,Losartan,and Metoprolol daily   . Peripheral edema     takes HCTZ daily  . Hyperlipidemia     takes Lipitor nightly  . CAD S/P percutaneous coronary angioplasty 1996, 2009, 01/2010    PTCA of L Cx 1996; BMS-RCA [facility]) 2009; Staged PCI RCA (70% ISR) --> 3.0 mm x 23 mm BMS, staged PCI Diag - 2.0 mm x 12 mm Mini Vision BMS (2.25 mm)  . Myocardial infarction 1996, 2009    x 2;last time was about 8-17yrs ago   . Osteoarthritis of both knees   . Chronic back pain     scoliosis--pt states unable to have surgery bc of age  . GERD (gastroesophageal reflux disease)     takes Nexium daily  . History of colon polyps   . Urinary frequency     takes Vesicare and Flomax daily  . Urinary urgency   . Prostate cancer 2005    seed implant  . Cataracts, bilateral     immature  . Anxiety     takes Valium prn  . Bleeding nose     occasionally  . Obesity (BMI 30.0-34.9)     Last BMI ~31;   . OSA (obstructive sleep apnea)   . Heart palpitations     PACs, short bursts of PSVT    Prior Cardiac Evaluation and Past Surgical History: Past Surgical History  Procedure Laterality Date  . Right knee arthrsoscopy  71yrs ago  . Cyst removed  in college  . Coronary angioplasty with stent placement  1996, 2009, August 2011    PTCA of L Cx 1996; BMS-RCA [facility]) 2009; Staged PCI RCA (70% ISR) --> 3.0 mm x 23 mm BMS, staged PCI Diag - 2.0 mm x 12 mm Mini Vision BMS (2.25 mm)  . Wisdom teeth extraccted    . Colonoscopy    . Seeds placed into prostate     . Cholecystectomy    . Neuroplasty /  transposition median nerve at carpal tunnel bilateral    . Right trigger finger    . Total knee arthroplasty  05/09/2012    Procedure: TOTAL KNEE ARTHROPLASTY;  Surgeon: Kerin Salen, MD;  Location: [place];  Service: Orthopedics;  Laterality: Right;  . Transthoracic echocardiogram  09/26/2012    EF 55-60%. Grade 2 diastolic dysfunction with moderate concentric LVH. Aortic sclerosis.  [name] [facility] [facility]iew [facility]td  November 2013    EF 53%; fixed mid inferior-inferolateral defect, infarct with no ischemia.   Interval History: he presents today with this stable overall symptoms. He has exertional dyspneareally this is more related to significant amount of pain in his hips and back related to spinal stenosis. It causes a lot of discomfort and he has 2 bili is her a lot of energy simply do simple activities around the house. With this he does get short of breath. He denies any chest pressure with rest or exertion. He walks relatively well and is relatively stable, but when he is standing still his back really hurts he has to use a  cane to maintain stability. He notes he has mild edema that goes down in the day.  No PND or orthopnea with minimal edema.  No significant palpitations (well controlled).   No lightheadedness, dizziness, weakness or syncope/near syncope.  He does occasionally have headaches in the evening. No TIA/amaurosis fugax symptoms. No melena, hematochezia, hematuria, or epstaxis. No claudication.  ROS: A comprehensive was performed. Review of Systems  Constitutional: Negative for malaise/fatigue.  HENT: Negative for nosebleeds.   Respiratory: Negative for cough, sputum production and wheezing.   Cardiovascular: Negative for claudication.  Gastrointestinal: Positive for heartburn. Negative for blood in stool and melena.  Genitourinary: Negative for hematuria.  Musculoskeletal: Positive for back pain and joint pain (Hips and knees). Negative for falls.  Neurological: Positive for tingling and  focal weakness (Occasional with neuropathy pain from spinal stenosis).  Psychiatric/Behavioral: Negative.     Current Outpatient Prescriptions on File Prior to Visit  Medication Sig Dispense Refill  . atorvastatin (LIPITOR) 40 MG tablet TAKE 1 TABLET DAILY AT BEDTIME 90 tablet 2  . cephALEXin (KEFLEX) 500 MG capsule Take 500 mg by mouth as needed. DENTAL PROCEDURE    . cholecalciferol (VITAMIN D) 1000 UNITS tablet Take 1,000 Units by mouth daily.    . clopidogrel (PLAVIX) 75 MG tablet Take 1 tablet (75 mg total) by mouth daily. 90 tablet 3  . diazepam (VALIUM) 2 MG tablet Take 2 mg by mouth as needed for anxiety.    [name] esomeprazole (NEXIUM) 40 MG capsule TAKE 1 CAPSULE DAILY 90 capsule 1  . hydrochlorothiazide (HYDRODIURIL) 12.5 MG tablet TAKE 1 TABLET DAILY IN THE MORNING 90 tablet 2  . HYDROmorphone (DILAUDID) 2 MG tablet Take 2 mg by mouth every 4 (four) hours as needed for pain.    [name] losartan (COZAAR) 100 MG tablet Take 1 tablet (100 mg total) by mouth daily. 90 tablet 2  . meloxicam (MOBIC) 15 MG tablet Take 15 mg by mouth daily.    . nitroGLYCERIN (NITROSTAT) 0.4 MG SL tablet Place 0.4 mg under the tongue every 5 (five) minutes as needed. For chest pain    . Polyethylene Glycol 3350 (MIRALAX PO) Take by mouth as needed.    . psyllium (METAMUCIL) 58.6 % powder Take 1 packet by mouth as needed.     . solifenacin (VESICARE) 10 MG tablet Take 10 mg by mouth daily.    . Tamsulosin HCl (FLOMAX) 0.4 MG CAPS Take 0.4 mg by mouth daily after supper.    . traMADol (ULTRAM) 50 MG tablet Take 50 mg by mouth 2 (two) times daily as needed.    . triamcinolone cream (KENALOG) 0.5 % Apply 1 application topically daily as needed.    . verapamil (VERELAN PM) 240 MG 24 hr capsule Take 1 capsule (240 mg total) by mouth daily. 90 capsule 0   No current facility-administered medications on file prior to visit.   Allergies  Allergen Reactions  . Crab [Shellfish Allergy]   . Hydrocodone Hives  . Adhesive  [Tape] Rash    History  Substance Use Topics  . Smoking status: Former Smoker    Types: Pipe    Quit date: 03/12/1993  . Smokeless tobacco: Not on file     Comment: quit smoking pipe about 61yrs ago  . Alcohol Use: Yes     Comment: glass of wine daily   he is at significant stress of late because his dishwasher was broken and cause a lot of leaking but over several months cause a  lot of water damage to his kitchen in the room as well as his flooring. This is causing a lot of consternation. History reviewed. No pertinent family history.   Wt Readings from Last 3 Encounters:  08/18/14 221 lb 1.6 oz (100.29 kg)  02/17/14 222 lb 9.6 oz (100.971 kg)  08/12/13 229 lb 12.8 oz (104.237 kg)    PHYSICAL EXAM BP 98/70 mmHg  Pulse 79  Ht 5\' 11"  (1.803 m)  Wt 221 lb 1.6 oz (100.29 kg)  BMI 30.85 kg/m2 General appearance: alert, cooperative, appears stated age, no distress; mildly obese  HEENT: [place]/AT, EOMI, MMM, anicteric sclera  Neck: no adenopathy, no carotid bruit and no JVD  Lungs: CTA B., normal percussion bilaterally and non-labored  Heart: RRR, S1, S2 normal, no murmur, click, rub or gallop ; nondisplaced PMI  Abdomen: soft, non-tender; bowel sounds normal; no masses, no organomegaly; mildly obese (truncal) Extremities: extremities normal, atraumatic, no cyanosis, and edema roughly 1+ up to 2 + in some spots. spots.  Pulses: 2+ and symmetric; but mildly diminished in the legs.  Neurologic/Psych: Mental status: Alert, oriented X 3, thought content appropriate/ pleasant mood and affect; Cranial nerves: normal (II-XII grossly intact   Adult ECG Report  Rate: 79 ;  Rhythm: normal sinus rhythm, premature atrial contractions (PAC), premature ventricular contractions (PVC) and Nonspecific ST and T wave abnormalities with flat ST segments.  Narrative Interpretation: overall stable EKG  Recent Labs:    Lab Results  Component Value Date   CHOL 155 07/29/2014   HDL 51 07/29/2014    LDLCALC 83 07/29/2014   TRIG 105 07/29/2014   CHOLHDL 2.9 02/12/2014  HDL Particle # - 28 (does not correlate) LDL Particle # - 892 - correlates well  Remainder of NMR panel reviewed & discussed with patient.    Chemistry      Component Value Date/Time   NA 140 07/29/2014 0951   K 3.4* 07/29/2014 0951   CL 104 07/29/2014 0951   CO2 26 07/29/2014 0951   BUN 20 07/29/2014 0951   CREATININE 0.93 07/29/2014 0951   CREATININE 0.85 09/28/2012 0955      Component Value Date/Time   CALCIUM 8.9 07/29/2014 0951   ALKPHOS 65 07/29/2014 0951   AST 13 07/29/2014 0951   ALT <8 07/29/2014 0951   BILITOT 1.0 07/29/2014 0951       ASSESSMENT / PLAN: Problem List Items Addressed This Visit    CAD S/P percutaneous coronary angioplasty - Primary (Chronic)    Overall stable from a cardiac standpoint with no active angina symptoms. He does have exertional dyspnea, but is not convinced it is related to cardiac etiology as he is quite deconditioned. At this point he still remains reluctant to pursue stress test evaluation.  Plan: continue secondary prevention with Plavix and statin.  oon stable dose of beta blocker and ARB.       Relevant Orders   EKG 12-Lead (Completed)   Lipid panel   Comprehensive metabolic panel   Essential hypertension (Chronic)    Your [place] hypotensive the day. He does have labile pressures. He is on a lot of medications and I reminded him that if he does feel lightheaded or dizzy he can hold his HCTZ or verapamil dose is due otherwise he can cut his losartan dose in half. However for the does not adjust the beta blocker dose.      Relevant Orders   EKG 12-Lead (Completed)   Lipid panel   Comprehensive metabolic panel  Exertional dyspnea and fatigue (Chronic)    Multifactorial -- obese, deconditioning, exertional back & hip pain that increases level of exertion. Not associated with chest pressure / tightness that was his prior anginal equivalent. Monitor Sx - if  worsens, will need to re-address idea of invasive vs. Non-invasive evaluation.       Heart palpitations (Chronic)    Notably improved - on BB & CCB. No SSx of chronotropic incompetence; monitor for bradycardia      Relevant Orders   EKG 12-Lead (Completed)   Lipid panel   Comprehensive metabolic panel   Hyperlipidemia with target LDL less than 70 (Chronic)    Overall his labs are relatively well controlled based on his recent NMR panel. His HDL is the only one of his liver concerning. Unfortunately with his lack of ability to exercise about how we will increase that significantly. We'll consider adding Lovaza or fish oil.  Recheck Labs ~10-11 months (annual). CMP & standard LIPIDS  Continue statin & dietary modification.      Relevant Orders   EKG 12-Lead (Completed)   Lipid panel   Comprehensive metabolic panel   Obesity (BMI 30.0-34.9) (Chronic)    Limited options with spinal stenosis significantly limiting exercise. Discussed importance of dietary discretion. Consider water exercises.      Relevant Orders   EKG 12-Lead (Completed)   Lipid panel   Comprehensive metabolic panel    Other Visit Diagnoses    Encounter for long-term (current) use of medications        Relevant Orders    Lipid panel    Comprehensive metabolic panel       Orders Placed This Encounter  Procedures  . Lipid panel    Standing Status: Future     Number of Occurrences:      Standing Expiration Date: 08/17/2016    Order Specific Question:  Has the patient fasted?    Answer:  Yes  . Comprehensive metabolic panel    Standing Status: Future     Number of Occurrences:      Standing Expiration Date: 08/17/2016    Order Specific Question:  Has the patient fasted?    Answer:  Yes  . EKG 12-Lead   No orders of the defined types were placed in this encounter.    Followup: 1 yr    Leonie Man, M.D., M.S. Interventional Cardiologist   Pager # [phone]

## 2014-08-20 NOTE — Assessment & Plan Note (Signed)
Notably improved - on BB & CCB. No SSx of chronotropic incompetence; monitor for bradycardia

## 2014-08-20 NOTE — Assessment & Plan Note (Signed)
Overall his labs are relatively well controlled based on his recent NMR panel. His HDL is the only one of his liver concerning. Unfortunately with his lack of ability to exercise about how we will increase that significantly. We'll consider adding Lovaza or fish oil.  Recheck Labs ~10-11 months (annual). CMP & standard LIPIDS  Continue statin & dietary modification.

## 2014-08-20 NOTE — Assessment & Plan Note (Signed)
Multifactorial -- obese, deconditioning, exertional back & hip pain that increases level of exertion. Not associated with chest pressure / tightness that was his prior anginal equivalent. Monitor Sx - if worsens, will need to re-address idea of invasive vs. Non-invasive evaluation.

## 2014-08-21 NOTE — Assessment & Plan Note (Signed)
Limited options with spinal stenosis significantly limiting exercise. Discussed importance of dietary discretion. Consider water exercises.

## 2014-09-03 ENCOUNTER — Other Ambulatory Visit: Payer: Self-pay | Admitting: Cardiology

## 2014-09-03 NOTE — Telephone Encounter (Signed)
Rx(s) sent to pharmacy electronically.  

## 2014-09-23 DIAGNOSIS — C61 Malignant neoplasm of prostate: Secondary | ICD-10-CM | POA: Diagnosis not present

## 2014-09-30 DIAGNOSIS — N4340 Spermatocele of epididymis, unspecified: Secondary | ICD-10-CM | POA: Diagnosis not present

## 2014-09-30 DIAGNOSIS — Z8546 Personal history of malignant neoplasm of prostate: Secondary | ICD-10-CM | POA: Diagnosis not present

## 2014-10-05 ENCOUNTER — Other Ambulatory Visit: Payer: Self-pay | Admitting: Cardiology

## 2014-10-06 NOTE — Telephone Encounter (Signed)
Rx(s) sent to pharmacy electronically.  

## 2014-10-08 ENCOUNTER — Encounter: Payer: Self-pay | Admitting: *Deleted

## 2014-11-28 ENCOUNTER — Other Ambulatory Visit: Payer: Self-pay | Admitting: Cardiology

## 2014-11-28 NOTE — Telephone Encounter (Signed)
Rx(s) sent to pharmacy electronically.  

## 2014-11-30 ENCOUNTER — Encounter (HOSPITAL_COMMUNITY): Payer: Self-pay | Admitting: *Deleted

## 2014-11-30 ENCOUNTER — Emergency Department (HOSPITAL_COMMUNITY): Payer: Medicare Other

## 2014-11-30 ENCOUNTER — Emergency Department (HOSPITAL_COMMUNITY)
Admission: EM | Admit: 2014-11-30 | Discharge: 2014-11-30 | Disposition: A | Discharge status: Home / Self Care | Payer: Medicare Other | Attending: Emergency Medicine | Admitting: Emergency Medicine

## 2014-11-30 DIAGNOSIS — Y939 Activity, unspecified: Secondary | ICD-10-CM | POA: Diagnosis not present

## 2014-11-30 DIAGNOSIS — S199XXA Unspecified injury of neck, initial encounter: Secondary | ICD-10-CM | POA: Diagnosis not present

## 2014-11-30 DIAGNOSIS — S4991XA Unspecified injury of right shoulder and upper arm, initial encounter: Secondary | ICD-10-CM | POA: Diagnosis present

## 2014-11-30 DIAGNOSIS — Z8546 Personal history of malignant neoplasm of prostate: Secondary | ICD-10-CM | POA: Diagnosis not present

## 2014-11-30 DIAGNOSIS — E669 Obesity, unspecified: Secondary | ICD-10-CM | POA: Diagnosis not present

## 2014-11-30 DIAGNOSIS — I252 Old myocardial infarction: Secondary | ICD-10-CM | POA: Insufficient documentation

## 2014-11-30 DIAGNOSIS — Z9861 Coronary angioplasty status: Secondary | ICD-10-CM | POA: Diagnosis not present

## 2014-11-30 DIAGNOSIS — E785 Hyperlipidemia, unspecified: Secondary | ICD-10-CM | POA: Insufficient documentation

## 2014-11-30 DIAGNOSIS — Y999 Unspecified external cause status: Secondary | ICD-10-CM | POA: Insufficient documentation

## 2014-11-30 DIAGNOSIS — F419 Anxiety disorder, unspecified: Secondary | ICD-10-CM | POA: Insufficient documentation

## 2014-11-30 DIAGNOSIS — IMO0002 Reserved for concepts with insufficient information to code with codable children: Secondary | ICD-10-CM

## 2014-11-30 DIAGNOSIS — Z86010 Personal history of colonic polyps: Secondary | ICD-10-CM | POA: Diagnosis not present

## 2014-11-30 DIAGNOSIS — I10 Essential (primary) hypertension: Secondary | ICD-10-CM | POA: Insufficient documentation

## 2014-11-30 DIAGNOSIS — Z791 Long term (current) use of non-steroidal anti-inflammatories (NSAID): Secondary | ICD-10-CM | POA: Diagnosis not present

## 2014-11-30 DIAGNOSIS — S0990XA Unspecified injury of head, initial encounter: Secondary | ICD-10-CM | POA: Diagnosis not present

## 2014-11-30 DIAGNOSIS — S023XXA Fracture of orbital floor, initial encounter for closed fracture: Secondary | ICD-10-CM | POA: Insufficient documentation

## 2014-11-30 DIAGNOSIS — Z79899 Other long term (current) drug therapy: Secondary | ICD-10-CM | POA: Diagnosis not present

## 2014-11-30 DIAGNOSIS — S01511A Laceration without foreign body of lip, initial encounter: Secondary | ICD-10-CM | POA: Diagnosis not present

## 2014-11-30 DIAGNOSIS — S01111A Laceration without foreign body of right eyelid and periocular area, initial encounter: Secondary | ICD-10-CM | POA: Diagnosis not present

## 2014-11-30 DIAGNOSIS — Z87891 Personal history of nicotine dependence: Secondary | ICD-10-CM | POA: Insufficient documentation

## 2014-11-30 DIAGNOSIS — S40011A Contusion of right shoulder, initial encounter: Secondary | ICD-10-CM | POA: Insufficient documentation

## 2014-11-30 DIAGNOSIS — W01198A Fall on same level from slipping, tripping and stumbling with subsequent striking against other object, initial encounter: Secondary | ICD-10-CM | POA: Insufficient documentation

## 2014-11-30 DIAGNOSIS — M170 Bilateral primary osteoarthritis of knee: Secondary | ICD-10-CM | POA: Diagnosis not present

## 2014-11-30 DIAGNOSIS — G8929 Other chronic pain: Secondary | ICD-10-CM | POA: Diagnosis not present

## 2014-11-30 DIAGNOSIS — Y92195 Garage of other specified residential institution as the place of occurrence of the external cause: Secondary | ICD-10-CM | POA: Insufficient documentation

## 2014-11-30 DIAGNOSIS — K047 Periapical abscess without sinus: Secondary | ICD-10-CM | POA: Diagnosis not present

## 2014-11-30 DIAGNOSIS — T148 Other injury of unspecified body region: Secondary | ICD-10-CM | POA: Diagnosis not present

## 2014-11-30 DIAGNOSIS — M25511 Pain in right shoulder: Secondary | ICD-10-CM | POA: Diagnosis not present

## 2014-11-30 DIAGNOSIS — K219 Gastro-esophageal reflux disease without esophagitis: Secondary | ICD-10-CM | POA: Insufficient documentation

## 2014-11-30 DIAGNOSIS — I2510 Atherosclerotic heart disease of native coronary artery without angina pectoris: Secondary | ICD-10-CM | POA: Diagnosis not present

## 2014-11-30 DIAGNOSIS — S0190XA Unspecified open wound of unspecified part of head, initial encounter: Secondary | ICD-10-CM | POA: Diagnosis not present

## 2014-11-30 LAB — BASIC METABOLIC PANEL
Anion gap: 8 (ref 5–15)
BUN: 22 mg/dL — ABNORMAL HIGH (ref 6–20)
CO2: 27 mmol/L (ref 22–32)
Calcium: 8.1 mg/dL — ABNORMAL LOW (ref 8.9–10.3)
Chloride: 104 mmol/L (ref 101–111)
Creatinine, Ser: 1.05 mg/dL (ref 0.61–1.24)
GFR calc Af Amer: >60 mL/min (ref >60)
GFR calc non Af Amer: >60 mL/min (ref >60)
Glucose, Bld: 136 mg/dL — ABNORMAL HIGH (ref 65–99)
Potassium: 3.7 mmol/L (ref 3.5–5.1)
Sodium: 139 mmol/L (ref 135–145)

## 2014-11-30 LAB — CBC
HCT: 34.5 % — ABNORMAL LOW (ref 39.0–52.0)
Hemoglobin: 11.7 g/dL — ABNORMAL LOW (ref 13.0–17.0)
MCH: 32 pg (ref 26.0–34.0)
MCHC: 33.9 g/dL (ref 30.0–36.0)
MCV: 94.3 fL (ref 78.0–100.0)
Platelets: 186 10*3/uL (ref 150–400)
RBC: 3.66 MIL/uL — ABNORMAL LOW (ref 4.22–5.81)
RDW: 13.1 % (ref 11.5–15.5)
WBC: 18.5 10*3/uL — ABNORMAL HIGH (ref 4.0–10.5)

## 2014-11-30 LAB — PROTIME-INR
INR: 1.24 (ref 0.00–1.49)
Prothrombin Time: 15.8 seconds — ABNORMAL HIGH (ref 11.6–15.2)

## 2014-11-30 LAB — APTT: aPTT: 25 seconds (ref 24–37)

## 2014-11-30 MED ORDER — PENICILLIN V POTASSIUM 500 MG PO TABS: 500.0000 mg | ORAL_TABLET | Freq: Four times a day (QID) | ORAL | Status: AC

## 2014-11-30 MED ORDER — LIDOCAINE HCL (PF) 1 % IJ SOLN
30.0000 mL | Freq: Once | INTRAMUSCULAR | Status: AC
  Administered 2014-11-30: 30 mL via INTRADERMAL
  Filled 2014-11-30: qty 30

## 2014-11-30 MED ORDER — LIDOCAINE HCL 2 % IJ SOLN
10.0000 mL | Freq: Once | INTRAMUSCULAR | Status: AC
  Administered 2014-11-30: 200 mg via INTRADERMAL
  Filled 2014-11-30: qty 20

## 2014-11-30 NOTE — ED Notes (Signed)
PA suturing eyebrow at this time.

## 2014-11-30 NOTE — ED Notes (Signed)
PA stitching pt lip again at this time.   Pt was able to stand to urinate without any dizziness or lightheadedness.

## 2014-11-30 NOTE — ED Notes (Signed)
Patient transported to X-ray 

## 2014-11-30 NOTE — ED Notes (Signed)
Pt presents via [facility] after a fall.  Pt was sitting on a stool in his garage and leaned forward and fell on the floor.  Pt was on floor for 45-1hr d/t not being able to get up, pt reports chronic back pain and knee pain.  Pt has lac to bottom lip and right eyebrow, a x 4.  Pt on Plaxix.  Estimated 300-400 cc blood loss.  Pt lip bleeding on arrival.  No trauma to tongue.  Pt denies headache or neck pain.  Pt received 200 cc NS en route.  Pt denies N/V/dizziness.  Pt in C-collar on arrival.  BP-123/57 P-72 R-18 O2-96% RA.  Pt a x 4, NAD.  Pressure applied to lip per PA.

## 2014-11-30 NOTE — ED Notes (Signed)
MD at bedside. 

## 2014-11-30 NOTE — ED Provider Notes (Signed)
CSN: [id number]     Arrival date & time 11/30/14  1338 History   First MD Initiated Contact with Patient 11/30/14 1347     Chief Complaint  Patient presents with  . Fall   HPI   81 year old male presents today status post fall. Patient reports that he was at home sitting on a rolling chair when he slid forward hitting his head on a shelf. Patient reports he was bleeding from his right eyebrow and lip, but due to baseline lower extremity weakness he was unable to make it to his feet on his own. Denies loss of consciousness, headache, dizziness, or precipitating event other than a mechanical fall. She reports he's on Plavix, has baseline osteoarthritis pain, but notes increasing pain in his neck at this time. She denies headache, neurological deficits, difficulty seeing, changes in vision, chest pain, abdominal pain, lower extremity pain. Patient does note pain to his right anterior shoulder.    Past Medical History  Diagnosis Date  . Hypertension     Labile -- takes Verapamil,Losartan,and Metoprolol daily   . Peripheral edema     takes HCTZ daily  . Hyperlipidemia     takes Lipitor nightly  . CAD S/P percutaneous coronary angioplasty 1996, 2009, 01/2010    PTCA of L Cx 1996; BMS-RCA [facility]) 2009; Staged PCI RCA (70% ISR) --> 3.0 mm x 23 mm BMS, staged PCI Diag - 2.0 mm x 12 mm Mini Vision BMS (2.25 mm)  . Myocardial infarction 1996, 2009    x 2;last time was about 8-36yrs ago   . Osteoarthritis of both knees   . Chronic back pain     scoliosis--pt states unable to have surgery bc of age  . GERD (gastroesophageal reflux disease)     takes Nexium daily  . History of colon polyps   . Urinary frequency     takes Vesicare and Flomax daily  . Urinary urgency   . Prostate cancer 2005    seed implant  . Cataracts, bilateral     immature  . Anxiety     takes Valium prn  . Bleeding nose     occasionally  . Obesity (BMI 30.0-34.9)     Last BMI ~31;   . OSA (obstructive sleep apnea)   .  Heart palpitations     PACs, short bursts of PSVT   Past Surgical History  Procedure Laterality Date  . Right knee arthrsoscopy  70yrs ago  . Cyst removed  in college  . Coronary angioplasty with stent placement  1996, 2009, August 2011    PTCA of L Cx 1996; BMS-RCA [facility]) 2009; Staged PCI RCA (70% ISR) --> 3.0 mm x 23 mm BMS, staged PCI Diag - 2.0 mm x 12 mm Mini Vision BMS (2.25 mm)  . Wisdom teeth extraccted    . Colonoscopy    . Seeds placed into prostate   2005  . Cholecystectomy    . Neuroplasty / transposition median nerve at carpal tunnel bilateral    . Right trigger finger    . Total knee arthroplasty  05/09/2012    Procedure: TOTAL KNEE ARTHROPLASTY;  Surgeon: Kerin Salen, MD;  Location: [facility];  Service: Orthopedics;  Laterality: Right;  . Transthoracic echocardiogram  09/26/2012    EF 55-60%. Grade 2 diastolic dysfunction with moderate concentric LVH. Aortic sclerosis.  [name] Nm myoview ltd  November 2013    EF 53%; fixed mid inferior-inferolateral defect, infarct with no ischemia.  [name] Nm myoview ltd  11/24/2010    ejection fraction 58%,left ventricle is normal size ,no evidence of inducible myocardial ischemia   No family history on file. History  Substance Use Topics  . Smoking status: Former Smoker    Types: Pipe    Quit date: 03/12/1993  . Smokeless tobacco: Not on file     Comment: quit smoking pipe about 75yrs ago  . Alcohol Use: Yes     Comment: glass of wine daily    Review of Systems  All other systems reviewed and are negative.    Allergies  Crab; Hydrocodone; and Adhesive  Home Medications   Prior to Admission medications   Medication Sig Start Date End Date Taking? Authorizing Provider  amoxicillin (AMOXIL) 500 MG capsule Take 2,000 mg by mouth See admin instructions. Takes before dentist appointment   Yes Historical Provider, MD  atorvastatin (LIPITOR) 40 MG tablet TAKE 1 TABLET DAILY AT BEDTIME 11/28/14  Yes Leonie Man, MD  cholecalciferol  (VITAMIN D) 1000 UNITS tablet Take 1,000 Units by mouth daily.   Yes Historical Provider, MD  clopidogrel (PLAVIX) 75 MG tablet Take 1 tablet (75 mg total) by mouth daily. 03/19/14  Yes Leonie Man, MD  diazepam (VALIUM) 2 MG tablet Take 2 mg by mouth as needed for anxiety.   Yes Historical Provider, MD  esomeprazole (NEXIUM) 40 MG capsule Take 1 capsule (40 mg total) by mouth daily. 10/06/14  Yes Leonie Man, MD  hydrochlorothiazide (HYDRODIURIL) 12.5 MG tablet TAKE 1 TABLET DAILY IN THE MORNING 11/28/14  Yes Leonie Man, MD  HYDROmorphone (DILAUDID) 2 MG tablet Take 2 mg by mouth every 4 (four) hours as needed for pain.   Yes Historical Provider, MD  losartan (COZAAR) 100 MG tablet Take 1 tablet (100 mg total) by mouth daily. 06/11/14  Yes Leonie Man, MD  meloxicam (MOBIC) 15 MG tablet Take 15 mg by mouth daily.   Yes Historical Provider, MD  metoprolol succinate (TOPROL-XL) 25 MG 24 hr tablet Take 1 tablet (25 mg total) by mouth daily. 08/19/14  Yes Leonie Man, MD  nitroGLYCERIN (NITROSTAT) 0.4 MG SL tablet Place 0.4 mg under the tongue every 5 (five) minutes as needed. For chest pain   Yes Historical Provider, MD  Polyethylene Glycol 3350 (MIRALAX PO) Take 17 g by mouth as needed.    Yes Historical Provider, MD  psyllium (METAMUCIL) 58.6 % powder Take 1 packet by mouth as needed.    Yes Historical Provider, MD  solifenacin (VESICARE) 10 MG tablet Take 10 mg by mouth daily.   Yes Historical Provider, MD  Tamsulosin HCl (FLOMAX) 0.4 MG CAPS Take 0.4 mg by mouth daily after supper.   Yes Historical Provider, MD  traMADol (ULTRAM) 50 MG tablet Take 50 mg by mouth 2 (two) times daily as needed.   Yes Historical Provider, MD  triamcinolone cream (KENALOG) 0.5 % Apply 1 application topically daily as needed.   Yes Historical Provider, MD  verapamil (VERELAN PM) 240 MG 24 hr capsule Take 1 capsule (240 mg total) by mouth daily. 09/03/14  Yes Leonie Man, MD  penicillin v potassium  (VEETID) 500 MG tablet Take 1 tablet (500 mg total) by mouth 4 (four) times daily. 11/30/14 12/07/14  Dellis Filbert Lamari Beckles, PA-C   BP 131/97 mmHg  Pulse 84  Temp(Src) 99.3 F (37.4 C) (Oral)  Resp 21  Wt 220 lb (99.791 kg)  SpO2 98% Physical Exam  Constitutional: He is oriented to person, place, and time. He  appears well-developed and well-nourished.  HENT:  Head: Normocephalic and atraumatic.  1.5 cm laceration below the right eyebrow, no tenderness to palpation of the surrounding area. 0.25 cm laceration to the inside of the lip.  Eyes: Conjunctivae are normal. Pupils are equal, round, and reactive to light. Right eye exhibits no discharge. Left eye exhibits no discharge. No scleral icterus.  Extraocular movements intact  Neck: Normal range of motion. Neck supple. No JVD present. No tracheal deviation present.  Minimal tenderness to palpation of posterior neck, no C-spine tenderness.  Pulmonary/Chest: Effort normal. No stridor.  Musculoskeletal:  No C-spine T-spine and L-spine tenderness, no signs of trauma to the back. Hematoma to the right anterior shoulder, tenderness to palpation. Patient has full active range of motion of all major joints, 5 out of 5 strength of all major muscle groups.  Neurological: He is alert and oriented to person, place, and time. He has normal strength. No cranial nerve deficit or sensory deficit. Coordination normal. GCS eye subscore is 4. GCS verbal subscore is 5. GCS motor subscore is 6.  Psychiatric: He has a normal mood and affect. His behavior is normal. Judgment and thought content normal.  Nursing note and vitals reviewed.   ED Course  Procedures (including critical care time)  LACERATION REPAIR Performed by: Elmer Ramp Authorized by: Elmer Ramp Consent: Verbal consent obtained. Risks and benefits: risks, benefits and alternatives were discussed Consent given by: patient Patient identity confirmed: provided demographic data Prepped  and Draped in normal sterile fashion Wound explored  Laceration Location: right eybrow  Laceration Length: 3 cm  No Foreign Bodies seen or palpated  Anesthesia: local infiltration  Local anesthetic: lidocaine 2%   Anesthetic total: 3 ml  Irrigation method: syringe Amount of cleaning: standard  Skin closure: simple   Number of sutures: 5  Technique: simple interrupted   Patient tolerance: Patient tolerated the procedure well with no immediate complications.  LACERATION REPAIR Performed by: Elmer Ramp Authorized by: Elmer Ramp Consent: Verbal consent obtained. Risks and benefits: risks, benefits and alternatives were discussed Consent given by: patient Patient identity confirmed: provided demographic data Prepped and Draped in normal sterile fashion Wound explored  Laceration Location: lower lip  Laceration Length: .5cm  No Foreign Bodies seen or palpated  Anesthesia: local infiltration  Local anesthetic: lidocaine 2% Anesthetic total: 1 ml  Irrigation method: syringe Amount of cleaning: standard  Skin closure: approx  Number of sutures: 3  Technique:  rapid    Patient tolerance: Patient tolerated the procedure well with no immediate complications.   Labs Review Labs Reviewed  CBC - Abnormal; Notable for the following:    WBC 18.5 (*)    RBC 3.66 (*)    Hemoglobin 11.7 (*)    HCT 34.5 (*)    All other components within normal limits  BASIC METABOLIC PANEL - Abnormal; Notable for the following:    Glucose, Bld 136 (*)    BUN 22 (*)    Calcium 8.1 (*)    All other components within normal limits  PROTIME-INR - Abnormal; Notable for the following:    Prothrombin Time 15.8 (*)    All other components within normal limits  APTT    Imaging Review Dg Wrist Complete Right  12/02/2014   CLINICAL DATA:  [place] 3 days ago with right wrist pain  EXAM: RIGHT WRIST - COMPLETE 3+ VIEW  COMPARISON:  None.  FINDINGS: The radiocarpal joint  space appears normal and the ulnar styloid is intact. The carpal  bones are in normal position. Mild degenerative change is present at the right first [facility] joint. No fracture is seen.  IMPRESSION: No fracture.  Degenerative change of the right first [facility] joint   Electronically Signed   By: Ivar Drape M.D.   On: 12/02/2014 11:54     EKG Interpretation None      MDM   Final diagnoses:  Laceration  Right shoulder pain  Orbital floor fracture, closed, initial encounter  Dental abscess    Labs: CBC, BMP, a PTT, PT/INR- skin for WBC 18.5, hemoglobin 11.7 PT 15.8  Imaging: CT head, CT cervical, CT maxillofacial, DG shoulder right- significant findings  Plan: Patient presents status post fall. Lacerations to his lip and his eye, orbital floor fracture. Extraocular movements intact, no significant palpation of the surrounding soft tissue or bones. Patient's wounds were closed, care instructions given. Patient instructed to follow-up with ENT specialist, primary care provider in 3 days for reevaluation of symptoms. Patient instructed to have sutures taken out in 5 days. Return immediately to the emergency room if new worsening signs or symptoms present. Patient verbalized understanding and agreement for today's plan and no further questions or concerns.      Okey Regal, PA-C 12/02/14 2126  Blanchie Dessert, MD 12/03/14 [phone]

## 2014-11-30 NOTE — ED Notes (Signed)
Patient transported to CT 

## 2014-11-30 NOTE — Discharge Instructions (Signed)
Please use medication only as directed. Please follow-up with her primary care provider informing him of your visit today. Please share all relevant data with him. Please follow-up with ENT specialist for further evaluation and management of orbital floor fracture. Please keep wound covered for the next 24 hours, followed by warm soap and water, do not scrub. Monitor for signs of infection, following up immediately if any present. Please inform your dentist after follow-up visit of relevant information from CT study and antibiotic use. Please follow-up with your primary care provider in 5-7 days for removal of sutures.

## 2014-12-02 ENCOUNTER — Other Ambulatory Visit: Payer: Self-pay | Admitting: Family Medicine

## 2014-12-02 ENCOUNTER — Ambulatory Visit
Admission: RE | Admit: 2014-12-02 | Discharge: 2014-12-02 | Disposition: A | Discharge status: Home / Self Care | Payer: Medicare Other | Source: Ambulatory Visit | Attending: Family Medicine | Admitting: Family Medicine

## 2014-12-02 DIAGNOSIS — M25531 Pain in right wrist: Secondary | ICD-10-CM

## 2014-12-02 DIAGNOSIS — R32 Unspecified urinary incontinence: Secondary | ICD-10-CM | POA: Diagnosis not present

## 2014-12-02 DIAGNOSIS — S6991XA Unspecified injury of right wrist, hand and finger(s), initial encounter: Secondary | ICD-10-CM | POA: Diagnosis not present

## 2014-12-02 DIAGNOSIS — G5601 Carpal tunnel syndrome, right upper limb: Secondary | ICD-10-CM | POA: Diagnosis not present

## 2014-12-05 DIAGNOSIS — W19XXXD Unspecified fall, subsequent encounter: Secondary | ICD-10-CM | POA: Diagnosis not present

## 2014-12-05 DIAGNOSIS — S023XXD Fracture of orbital floor, subsequent encounter for fracture with routine healing: Secondary | ICD-10-CM | POA: Diagnosis not present

## 2014-12-09 DIAGNOSIS — N401 Enlarged prostate with lower urinary tract symptoms: Secondary | ICD-10-CM | POA: Diagnosis not present

## 2014-12-09 DIAGNOSIS — N3941 Urge incontinence: Secondary | ICD-10-CM | POA: Diagnosis not present

## 2014-12-09 DIAGNOSIS — R3915 Urgency of urination: Secondary | ICD-10-CM | POA: Diagnosis not present

## 2014-12-11 DIAGNOSIS — M180 Bilateral primary osteoarthritis of first carpometacarpal joints: Secondary | ICD-10-CM | POA: Diagnosis not present

## 2014-12-11 DIAGNOSIS — G5601 Carpal tunnel syndrome, right upper limb: Secondary | ICD-10-CM | POA: Diagnosis not present

## 2015-01-05 DIAGNOSIS — M180 Bilateral primary osteoarthritis of first carpometacarpal joints: Secondary | ICD-10-CM | POA: Diagnosis not present

## 2015-01-05 DIAGNOSIS — G5601 Carpal tunnel syndrome, right upper limb: Secondary | ICD-10-CM | POA: Diagnosis not present

## 2015-01-13 DIAGNOSIS — R3915 Urgency of urination: Secondary | ICD-10-CM | POA: Diagnosis not present

## 2015-01-13 DIAGNOSIS — N3941 Urge incontinence: Secondary | ICD-10-CM | POA: Diagnosis not present

## 2015-01-13 DIAGNOSIS — N401 Enlarged prostate with lower urinary tract symptoms: Secondary | ICD-10-CM | POA: Diagnosis not present

## 2015-01-13 DIAGNOSIS — N4340 Spermatocele of epididymis, unspecified: Secondary | ICD-10-CM | POA: Diagnosis not present

## 2015-01-13 DIAGNOSIS — N138 Other obstructive and reflux uropathy: Secondary | ICD-10-CM | POA: Diagnosis not present

## 2015-01-19 DIAGNOSIS — G5601 Carpal tunnel syndrome, right upper limb: Secondary | ICD-10-CM | POA: Diagnosis not present

## 2015-01-19 DIAGNOSIS — G5602 Carpal tunnel syndrome, left upper limb: Secondary | ICD-10-CM | POA: Diagnosis not present

## 2015-02-17 DIAGNOSIS — G5601 Carpal tunnel syndrome, right upper limb: Secondary | ICD-10-CM | POA: Diagnosis not present

## 2015-02-17 DIAGNOSIS — G5602 Carpal tunnel syndrome, left upper limb: Secondary | ICD-10-CM | POA: Diagnosis not present

## 2015-03-02 DIAGNOSIS — I10 Essential (primary) hypertension: Secondary | ICD-10-CM | POA: Diagnosis not present

## 2015-03-02 DIAGNOSIS — D489 Neoplasm of uncertain behavior, unspecified: Secondary | ICD-10-CM | POA: Diagnosis not present

## 2015-03-02 DIAGNOSIS — M150 Primary generalized (osteo)arthritis: Secondary | ICD-10-CM | POA: Diagnosis not present

## 2015-03-02 DIAGNOSIS — I2510 Atherosclerotic heart disease of native coronary artery without angina pectoris: Secondary | ICD-10-CM | POA: Diagnosis not present

## 2015-03-02 DIAGNOSIS — K5900 Constipation, unspecified: Secondary | ICD-10-CM | POA: Diagnosis not present

## 2015-03-02 DIAGNOSIS — K219 Gastro-esophageal reflux disease without esophagitis: Secondary | ICD-10-CM | POA: Diagnosis not present

## 2015-03-02 DIAGNOSIS — D72829 Elevated white blood cell count, unspecified: Secondary | ICD-10-CM | POA: Diagnosis not present

## 2015-03-02 DIAGNOSIS — Z23 Encounter for immunization: Secondary | ICD-10-CM | POA: Diagnosis not present

## 2015-03-02 DIAGNOSIS — R0600 Dyspnea, unspecified: Secondary | ICD-10-CM | POA: Diagnosis not present

## 2015-03-02 DIAGNOSIS — R413 Other amnesia: Secondary | ICD-10-CM | POA: Diagnosis not present

## 2015-03-02 DIAGNOSIS — Z0000 Encounter for general adult medical examination without abnormal findings: Secondary | ICD-10-CM | POA: Diagnosis not present

## 2015-03-07 ENCOUNTER — Other Ambulatory Visit: Payer: Self-pay | Admitting: Cardiology

## 2015-03-09 DIAGNOSIS — G5601 Carpal tunnel syndrome, right upper limb: Secondary | ICD-10-CM | POA: Diagnosis not present

## 2015-03-09 NOTE — Telephone Encounter (Signed)
Rx(s) sent to pharmacy electronically.  

## 2015-04-01 DIAGNOSIS — D649 Anemia, unspecified: Secondary | ICD-10-CM | POA: Diagnosis not present

## 2015-04-06 DIAGNOSIS — D485 Neoplasm of uncertain behavior of skin: Secondary | ICD-10-CM | POA: Diagnosis not present

## 2015-04-06 DIAGNOSIS — R0981 Nasal congestion: Secondary | ICD-10-CM | POA: Diagnosis not present

## 2015-04-06 DIAGNOSIS — R413 Other amnesia: Secondary | ICD-10-CM | POA: Diagnosis not present

## 2015-04-06 DIAGNOSIS — D489 Neoplasm of uncertain behavior, unspecified: Secondary | ICD-10-CM | POA: Diagnosis not present

## 2015-04-22 DIAGNOSIS — H2513 Age-related nuclear cataract, bilateral: Secondary | ICD-10-CM | POA: Diagnosis not present

## 2015-04-22 DIAGNOSIS — H04123 Dry eye syndrome of bilateral lacrimal glands: Secondary | ICD-10-CM | POA: Diagnosis not present

## 2015-06-07 HISTORY — PX: TRANSTHORACIC ECHOCARDIOGRAM: SHX275

## 2015-06-08 ENCOUNTER — Telehealth: Payer: Self-pay | Admitting: Physician Assistant

## 2015-06-08 NOTE — Telephone Encounter (Signed)
Patient called in to report dyspnea particularly with exertion for the last 2 weeks. His weight appears to be stable and is 3 pounds less than it was last March. Blood pressure today was 146/86. He denies orthopnea, PND but does have a little bit of swelling in his legs. We will get him in to the flexed clinic Jan 3.  Tatem Fesler, PAC

## 2015-06-09 ENCOUNTER — Ambulatory Visit (INDEPENDENT_AMBULATORY_CARE_PROVIDER_SITE_OTHER): Payer: Medicare Other | Admitting: Cardiology

## 2015-06-09 ENCOUNTER — Encounter: Payer: Self-pay | Admitting: Cardiology

## 2015-06-09 ENCOUNTER — Other Ambulatory Visit: Payer: Medicare Other

## 2015-06-09 VITALS — BP 120/72 | HR 78 | Ht 69.0 in | Wt 224.0 lb

## 2015-06-09 DIAGNOSIS — R0609 Other forms of dyspnea: Secondary | ICD-10-CM

## 2015-06-09 DIAGNOSIS — I4891 Unspecified atrial fibrillation: Secondary | ICD-10-CM | POA: Diagnosis not present

## 2015-06-09 LAB — CBC WITH DIFFERENTIAL/PLATELET
BASOS ABS: 0.1 10*3/uL (ref 0.0–0.1)
Basophils Relative: 1 % (ref 0–1)
EOS ABS: 0.2 10*3/uL (ref 0.0–0.7)
Eosinophils Relative: 2 % (ref 0–5)
HCT: 30.5 % — ABNORMAL LOW (ref 39.0–52.0)
Hemoglobin: 10.1 g/dL — ABNORMAL LOW (ref 13.0–17.0)
LYMPHS ABS: 1.7 10*3/uL (ref 0.7–4.0)
Lymphocytes Relative: 20 % (ref 12–46)
MCH: 25.7 pg — ABNORMAL LOW (ref 26.0–34.0)
MCHC: 33.1 g/dL (ref 30.0–36.0)
MCV: 77.6 fL — AB (ref 78.0–100.0)
MPV: 8.2 fL — AB (ref 8.6–12.4)
Monocytes Absolute: 0.8 10*3/uL (ref 0.1–1.0)
Monocytes Relative: 10 % (ref 3–12)
NEUTROS ABS: 5.6 10*3/uL (ref 1.7–7.7)
Neutrophils Relative %: 67 % (ref 43–77)
PLATELETS: 300 10*3/uL (ref 150–400)
RBC: 3.93 MIL/uL — ABNORMAL LOW (ref 4.22–5.81)
RDW: 17.5 % — ABNORMAL HIGH (ref 11.5–15.5)
WBC: 8.3 10*3/uL (ref 4.0–10.5)

## 2015-06-09 LAB — BASIC METABOLIC PANEL
BUN: 23 mg/dL (ref 7–25)
CHLORIDE: 104 mmol/L (ref 98–110)
CO2: 25 mmol/L (ref 20–31)
Calcium: 8.7 mg/dL (ref 8.6–10.3)
Creat: 1.06 mg/dL (ref 0.70–1.11)
GLUCOSE: 94 mg/dL (ref 65–99)
POTASSIUM: 4.6 mmol/L (ref 3.5–5.3)
Sodium: 140 mmol/L (ref 135–146)

## 2015-06-09 LAB — MAGNESIUM: Magnesium: 1.8 mg/dL (ref 1.5–2.5)

## 2015-06-09 MED ORDER — APIXABAN 5 MG PO TABS: 5.0000 mg | ORAL_TABLET | Freq: Two times a day (BID) | ORAL | Status: DC

## 2015-06-09 NOTE — Progress Notes (Signed)
06/09/2015 Jerry [name]   1929-08-25  [id number]  Primary Physician [name], MD Primary Cardiologist: [name]   Reason for Visit/CC: Dyspnea on Exertion  HPI:  82 y/o male, followed by [name], who presents to [facility] with a complaint of DOE x the past 2 weeks. His PMH is significant for CAD/ previous MI, s/p PTCA of LCx 1996; BMS-RCA [facility]) 2009; Staged PCI RCA (70% ISR) --> 3.0 mm x 23 mm BMS, staged PCI Diag - 2.0 mm x 12 mm Mini Vision BMS (2.25 mm) in 2011, PACs, HTN and HLD. He had a Myoview in 04/2012 that showed a fixed mid inferior-inferolateral defect/ infarct with no ischemia. EF was 53%. His most recent echocardiogram 09/2012 also showed normal EF of 55-60% w/ G2DD, aortic sclerosis w/o stenosis and mild PAH. His last OV with [name] was 08/2014. He did complain of chronic exertional dyspnea at that time w/o CP, but it was felt to be secondary to poor conditioning. However, stress testing was discussed but, per note, the patient was reluctant to pursue. [name] noted that if symptoms worsened, to re-visit idea of ischemic w/u to r/o cardiac etiology.   Today in clinic, he notes progressive symptoms over the last 2 weeks. He denies any significant weight gain. No chest pain. He symptoms at rest. Only with exertion. He denies syncope/ near syncope.   EKG today shows new-onset atrial fibrillation with a CVR in the 70s. BP is stable. He is on metoprolol and verapamil. BP is stable.      Current Outpatient Prescriptions  Medication Sig Dispense Refill  . amoxicillin (AMOXIL) 500 MG capsule Take 2,000 mg by mouth See admin instructions. Takes before dentist appointment    . atorvastatin (LIPITOR) 40 MG tablet Take 40 mg by mouth daily.    . cholecalciferol (VITAMIN D) 1000 UNITS tablet Take 1,000 Units by mouth daily.    . diazepam (VALIUM) 2 MG tablet Take 2 mg by mouth daily as needed for anxiety.     [name] esomeprazole (NEXIUM) 40 MG capsule Take 1  capsule (40 mg total) by mouth daily. 90 capsule 3  . hydrochlorothiazide (HYDRODIURIL) 12.5 MG tablet Take 12.5 mg by mouth every morning.    [name] HYDROmorphone (DILAUDID) 2 MG tablet Take 2 mg by mouth every 4 (four) hours as needed for pain.    [name] losartan (COZAAR) 100 MG tablet Take 1 tablet (100 mg total) by mouth daily. 90 tablet 1  . meloxicam (MOBIC) 15 MG tablet Take 15 mg by mouth daily.    . metoprolol succinate (TOPROL-XL) 25 MG 24 hr tablet Take 1 tablet (25 mg total) by mouth daily. 90 tablet 3  . mirabegron ER (MYRBETRIQ) 25 MG TB24 tablet Take 25 mg by mouth daily.    . nitroGLYCERIN (NITROSTAT) 0.4 MG SL tablet Place 0.4 mg under the tongue every 5 (five) minutes as needed. For chest pain    . Polyethylene Glycol 3350 (MIRALAX PO) Take 17 g by mouth daily as needed (Constipation).     . psyllium (METAMUCIL) 58.6 % powder Take 1 packet by mouth daily as needed (Fiber).     . solifenacin (VESICARE) 10 MG tablet Take 10 mg by mouth daily.    . Tamsulosin HCl (FLOMAX) 0.4 MG CAPS Take 0.4 mg by mouth daily after supper.    . traMADol (ULTRAM) 50 MG tablet Take 50 mg by mouth 2 (two) times daily as needed.    . triamcinolone cream (KENALOG)  0.5 % Apply 1 application topically daily as needed (Skin).     . verapamil (VERELAN PM) 240 MG 24 hr capsule Take 1 capsule (240 mg total) by mouth daily. 90 capsule 3  . apixaban (ELIQUIS) 5 MG TABS tablet Take 1 tablet (5 mg total) by mouth 2 (two) times daily. 60 tablet 11   No current facility-administered medications for this visit.    Allergies  Allergen Reactions  . Crab [Shellfish Allergy]     Severe rash  . Hydrocodone Hives  . Adhesive [Tape] Rash  . Oxycodone Rash    Social History   Social History  . Marital Status: Married    Spouse Name: N/A  . Number of Children: N/A  . Years of Education: N/A   Occupational History  . Not on file.   Social History Main Topics  . Smoking status: Former Smoker    Types: Pipe     Quit date: 03/12/1993  . Smokeless tobacco: Not on file     Comment: quit smoking pipe about 91yrs ago  . Alcohol Use: Yes     Comment: glass of wine daily  . Drug Use: No  . Sexual Activity: Not Currently   Other Topics Concern  . Not on file   Social History Narrative   He is a married father of 2, grandfather of 2. He exercises at least 3 to 4 times a week but does something 6 to 7 days a week. He does not smoke. Drinks occasional alcohol.      Review of Systems: General: negative for chills, fever, night sweats or weight changes.  Cardiovascular: negative for chest pain, + for dyspnea on exertion, edema, negative for orthopnea, palpitations, paroxysmal nocturnal dyspnea or shortness of breath Dermatological: negative for rash Respiratory: negative for cough or wheezing Urologic: negative for hematuria Abdominal: negative for nausea, vomiting, diarrhea, bright red blood per rectum, melena, or hematemesis Neurologic: negative for visual changes, syncope, or dizziness All other systems reviewed and are otherwise negative except as noted above.    Blood pressure 120/72, pulse 78, height 5\' 9"  (1.753 m), weight 224 lb (101.606 kg), SpO2 96 %.  General appearance: alert, cooperative and no distress Neck: no carotid bruit and no JVD Lungs: clear to auscultation bilaterally Heart: irregularly irregular rhythm and regular rate Extremities: no LEE Pulses: 2+ and symmetric Skin: warm and dry Neurologic: Grossly normal  EKG atrial fibrillation with a CVR, 78 bpm   ASSESSMENT AND PLAN:    1. New-Onset Atrial Fibrillation: this is likely the cause of his DOE. Luckily, he is rate controlled on metoprolol and verapamil. I have discussed case with Dr. Caryl Comes, DOD. Given a CHA2DS2 VASc score of 4 for Age >75, HTN and vascular disease, we will place him on oral anticoagulation with Eliquis. His last BMP showed normal renal function with SCr <1.5. Thus, we will plan to place him on 5 mg BID.  We will check a BMP today along with a CBC to assess baseline H/H. We will also assess potassium level on BMP and will check a Mg level and TSH. As it has been >1 yr since his last coronary stent, we will discontinue his Plavix. Dr. Caryl Comes has also recommended DCCV. Patient has been given option of TEE guided DCCV soon vs waiting for 3 weeks on anticoagulation then pursuing DCCV only. However, he is not ready to make a decision just yet. He wishes to discuss options further with his wife. He was given patient information on  atrial fibrillation, cardioversion and TEE. He will make a decision and call our office tomorrow with plan. He understands that regardless, he is to continue Eliquis uninterrupted. Per Dr. Caryl Comes, we will also check a 2D echo to reassess LVF and wall motion.    2. CAD: h/o prior MI + s/p PTCA of LCx 1996; BMS-RCA [facility]) 2009; Staged PCI RCA (70% ISR) --> 3.0 mm x 23 mm BMS, staged PCI Diag - 2.0 mm x 12 mm Mini Vision BMS (2.25 mm). He denies CP. It has been >1 yr since his last stent placement. We will discontinue his Plavix, since we are starting Eliquis. May consider stress test following cardioversion if still with DOE.   3. HTN: BP is well controlled on current regimen.   4. HLD:  Continue statin.   Note: >45 minutes of direct patient care + teaching/ patient education + time addressing questions was dedicated to today's visit.   Lyda Jester PA-C 06/09/2015 3:58 PM

## 2015-06-09 NOTE — Patient Instructions (Addendum)
Medication Instructions:  1. START ELIQUIS 5 MG 1 TABLET TWICE DAILY  Labwork: TODAY BMET, TSH, MAGNESIUM LEVEL, CBC W/DIFF, BNP  Testing/Procedures: Your physician has requested that you have an echocardiogram. Echocardiography is a painless test that uses sound waves to create images of your heart. It provides your doctor with information about the size and shape of your heart and how well your heart's chambers and valves are working. This procedure takes approximately one hour. There are no restrictions for this procedure.   Follow-Up:  1. CARDIOVERSION IS RECOMMENDED; PT WILL CALL BACK TO SCHEDULE 06/10/15  2. FOLLOW UP WITH DR. HARDING; ONCE CARDIOVERSION HAS BEEN DONE  Any Other Special Instructions Will Be Listed Below (If Applicable).  IF CARDIOVERSION IS GOING TO BE DONE IN 3 WEEKS;  YOU WILL NEED TO COME IN FOR AN EKG WITH THE NURSE THE DAY BEFORE   If you need a refill on your cardiac medications before your next appointment, please call your pharmacy.

## 2015-06-10 ENCOUNTER — Telehealth: Payer: Self-pay | Admitting: Cardiology

## 2015-06-10 ENCOUNTER — Encounter: Payer: Self-pay | Admitting: *Deleted

## 2015-06-10 LAB — TSH: TSH: 3.437 u[IU]/mL (ref 0.350–4.500)

## 2015-06-10 LAB — BRAIN NATRIURETIC PEPTIDE: BRAIN NATRIURETIC PEPTIDE: 261.1 pg/mL — AB (ref 0.0–100.0)

## 2015-06-10 NOTE — Telephone Encounter (Signed)
New Message   Pt calling to speak to rn about available dates:  ASAP 06/11/15 any time after 9:00am

## 2015-06-11 NOTE — Telephone Encounter (Signed)
S/w pt yesterday about scheduling TEE/DCCV per Ellen Henri, [place]. Pt scheduled for 06/12/15 w/[name]. Pt given oral instructions by phone as well as picked up paper instructions yesterday as well.

## 2015-06-12 ENCOUNTER — Ambulatory Visit (HOSPITAL_COMMUNITY)
Admission: RE | Admit: 2015-06-12 | Discharge: 2015-06-12 | Disposition: A | Discharge status: Home / Self Care | Payer: Medicare Other | Source: Ambulatory Visit | Attending: Cardiovascular Disease | Admitting: Cardiovascular Disease

## 2015-06-12 ENCOUNTER — Ambulatory Visit (HOSPITAL_COMMUNITY): Payer: Medicare Other | Admitting: Anesthesiology

## 2015-06-12 ENCOUNTER — Ambulatory Visit (HOSPITAL_BASED_OUTPATIENT_CLINIC_OR_DEPARTMENT_OTHER)
Admission: RE | Admit: 2015-06-12 | Discharge: 2015-06-12 | Disposition: A | Discharge status: Home / Self Care | Payer: Medicare Other | Source: Ambulatory Visit | Attending: Cardiovascular Disease | Admitting: Cardiovascular Disease

## 2015-06-12 ENCOUNTER — Encounter (HOSPITAL_COMMUNITY): Payer: Self-pay | Admitting: Anesthesiology

## 2015-06-12 ENCOUNTER — Encounter (HOSPITAL_COMMUNITY)
Admission: RE | Disposition: A | Discharge status: Home / Self Care | Payer: Self-pay | Source: Ambulatory Visit | Attending: Cardiovascular Disease

## 2015-06-12 ENCOUNTER — Other Ambulatory Visit: Payer: Self-pay

## 2015-06-12 DIAGNOSIS — E785 Hyperlipidemia, unspecified: Secondary | ICD-10-CM | POA: Diagnosis not present

## 2015-06-12 DIAGNOSIS — I2510 Atherosclerotic heart disease of native coronary artery without angina pectoris: Secondary | ICD-10-CM | POA: Diagnosis not present

## 2015-06-12 DIAGNOSIS — G4730 Sleep apnea, unspecified: Secondary | ICD-10-CM | POA: Insufficient documentation

## 2015-06-12 DIAGNOSIS — Z87891 Personal history of nicotine dependence: Secondary | ICD-10-CM | POA: Diagnosis not present

## 2015-06-12 DIAGNOSIS — I252 Old myocardial infarction: Secondary | ICD-10-CM | POA: Insufficient documentation

## 2015-06-12 DIAGNOSIS — Z791 Long term (current) use of non-steroidal anti-inflammatories (NSAID): Secondary | ICD-10-CM | POA: Diagnosis not present

## 2015-06-12 DIAGNOSIS — I10 Essential (primary) hypertension: Secondary | ICD-10-CM | POA: Diagnosis not present

## 2015-06-12 DIAGNOSIS — I4891 Unspecified atrial fibrillation: Secondary | ICD-10-CM

## 2015-06-12 DIAGNOSIS — Z955 Presence of coronary angioplasty implant and graft: Secondary | ICD-10-CM | POA: Insufficient documentation

## 2015-06-12 DIAGNOSIS — Z79899 Other long term (current) drug therapy: Secondary | ICD-10-CM | POA: Insufficient documentation

## 2015-06-12 DIAGNOSIS — I481 Persistent atrial fibrillation: Secondary | ICD-10-CM | POA: Diagnosis not present

## 2015-06-12 DIAGNOSIS — Z7901 Long term (current) use of anticoagulants: Secondary | ICD-10-CM | POA: Insufficient documentation

## 2015-06-12 DIAGNOSIS — K219 Gastro-esophageal reflux disease without esophagitis: Secondary | ICD-10-CM | POA: Insufficient documentation

## 2015-06-12 DIAGNOSIS — I4819 Other persistent atrial fibrillation: Secondary | ICD-10-CM | POA: Insufficient documentation

## 2015-06-12 HISTORY — PX: TEE WITHOUT CARDIOVERSION: SHX5443

## 2015-06-12 HISTORY — PX: CARDIOVERSION: SHX1299

## 2015-06-12 SURGERY — CARDIOVERSION
Anesthesia: Monitor Anesthesia Care

## 2015-06-12 MED ORDER — LACTATED RINGERS IV SOLN
INTRAVENOUS | Status: DC | PRN
  Administered 2015-06-12: 14:00:00 via INTRAVENOUS

## 2015-06-12 MED ORDER — PHENYLEPHRINE HCL 10 MG/ML IJ SOLN
INTRAMUSCULAR | Status: DC | PRN
  Administered 2015-06-12 (×3): 120 ug via INTRAVENOUS

## 2015-06-12 MED ORDER — LIDOCAINE HCL (CARDIAC) 20 MG/ML IV SOLN
INTRAVENOUS | Status: DC | PRN
  Administered 2015-06-12: 50 mg via INTRAVENOUS

## 2015-06-12 MED ORDER — PROPOFOL 10 MG/ML IV BOLUS
INTRAVENOUS | Status: DC | PRN
  Administered 2015-06-12: 100 mg via INTRAVENOUS

## 2015-06-12 MED ORDER — BUTAMBEN-TETRACAINE-BENZOCAINE 2-2-14 % EX AERO
INHALATION_SPRAY | CUTANEOUS | Status: DC | PRN
  Administered 2015-06-12: 2 via TOPICAL

## 2015-06-12 MED ORDER — LACTATED RINGERS IV SOLN: INTRAVENOUS | Status: DC

## 2015-06-12 MED ORDER — PROPOFOL 500 MG/50ML IV EMUL
INTRAVENOUS | Status: DC | PRN
  Administered 2015-06-12: 100 ug/kg/min via INTRAVENOUS

## 2015-06-12 NOTE — Interval H&P Note (Signed)
History and Physical Interval Note:  06/12/2015 12:45 PM  Jerry [name]  has presented today for surgery, with the diagnosis of afib  The various methods of treatment have been discussed with the patient and family. After consideration of risks, benefits and other options for treatment, the patient has consented to  Procedure(s): CARDIOVERSION (N/A) TRANSESOPHAGEAL ECHOCARDIOGRAM (TEE) (N/A) as a surgical intervention .  The patient's history has been reviewed, patient examined, no change in status, stable for surgery.  I have reviewed the patient's chart and labs.  Questions were answered to the patient's satisfaction.     Volanda Mangine

## 2015-06-12 NOTE — Anesthesia Preprocedure Evaluation (Signed)
Anesthesia Evaluation  Patient identified by MRN, date of birth, ID band Patient awake    Reviewed: Allergy & Precautions, NPO status   Airway Mallampati: II  TM Distance: >3 FB Neck ROM: Full    Dental   Pulmonary shortness of breath, sleep apnea , former smoker,    breath sounds clear to auscultation       Cardiovascular hypertension, + CAD and + Past MI   Rhythm:Regular Rate:Normal     Neuro/Psych    GI/Hepatic Neg liver ROS, GERD  ,  Endo/Other  negative endocrine ROS  Renal/GU negative Renal ROS     Musculoskeletal   Abdominal   Peds  Hematology   Anesthesia Other Findings   Reproductive/Obstetrics                             Anesthesia Physical Anesthesia Plan  ASA: IV  Anesthesia Plan: MAC   Post-op Pain Management:    Induction: Intravenous  Airway Management Planned: Nasal Cannula  Additional Equipment:   Intra-op Plan:   Post-operative Plan:   Informed Consent: I have reviewed the patients History and Physical, chart, labs and discussed the procedure including the risks, benefits and alternatives for the proposed anesthesia with the patient or authorized representative who has indicated his/her understanding and acceptance.   Dental advisory given  Plan Discussed with: CRNA, Anesthesiologist and Surgeon  Anesthesia Plan Comments:         Anesthesia Quick Evaluation

## 2015-06-12 NOTE — Discharge Instructions (Signed)
Electrical Cardioversion, Care After °Refer to this sheet in the next few weeks. These instructions provide you with information on caring for yourself after your procedure. Your health care provider may also give you more specific instructions. Your treatment has been planned according to current medical practices, but problems sometimes occur. Call your health care provider if you have any problems or questions after your procedure. °WHAT TO EXPECT AFTER THE PROCEDURE °After your procedure, it is typical to have the following sensations: °· Some redness on the skin where the shocks were delivered. If this is tender, a sunburn lotion or hydrocortisone cream may help. °· Possible return of an abnormal heart rhythm within hours or days after the procedure. °HOME CARE INSTRUCTIONS °· Take medicines only as directed by your health care provider. Be sure you understand how and when to take your medicine. °· Learn how to feel your pulse and check it often. °· Limit your activity for 48 hours after the procedure or as directed by your health care provider. °· Avoid or minimize caffeine and other stimulants as directed by your health care provider. °SEEK MEDICAL CARE IF: °· You feel like your heart is beating too fast or your pulse is not regular. °· You have any questions about your medicines. °· You have bleeding that will not stop. °SEEK IMMEDIATE MEDICAL CARE IF: °· You are dizzy or feel faint. °· It is hard to breathe or you feel short of breath. °· There is a change in discomfort in your chest. °· Your speech is slurred or you have trouble moving an arm or leg on one side of your body. °· You get a serious muscle cramp that does not go away. °· Your fingers or toes turn cold or blue. °  °This information is not intended to replace advice given to you by your health care provider. Make sure you discuss any questions you have with your health care provider. °  °Document Released: 03/13/2013 Document Revised: 06/13/2014  Document Reviewed: 03/13/2013 °Elsevier Interactive Patient Education ©2016 [employer] °Transesophageal Echocardiogram °Transesophageal echocardiography (TEE) is a picture test of your heart using sound waves. The pictures taken can give very detailed pictures of your heart. This can help your doctor see if there are problems with your heart. TEE can check: °· If your heart has blood clots in it. °· How well your heart valves are working. °· If you have an infection on the inside of your heart. °· Some of the major arteries of your heart. °· If your heart valve is working after a repair. °· Your heart before a procedure that uses a shock to your heart to get the rhythm back to normal. °BEFORE THE PROCEDURE °· Do not eat or drink for 6 hours before the procedure or as told by your doctor. °· Make plans to have someone drive you home after the procedure. Do not drive yourself home. °· An IV tube will be put in your arm. °PROCEDURE °· You will be given a medicine to help you relax (sedative). It will be given through the IV tube. °· A numbing medicine will be sprayed or gargled in the back of your throat to help numb it. °· The tip of the probe is placed into the back of your mouth. You will be asked to swallow. This helps to pass the probe into your esophagus. °· Once the tip of the probe is in the right place, your doctor can take pictures of your heart. °· You   heart.  You may feel pressure at the back of your throat. AFTER THE PROCEDURE  You will be taken to a recovery area so the sedative can wear off.  Your throat may be sore and scratchy. This will go away slowly over time.  You will go home when you are fully awake and able to swallow liquids.  You should have someone stay with you for the next 24 hours.  Do not drive or operate machinery for the next 24 hours.   This information is not intended to replace advice given to you by your health care provider. Make sure you discuss any questions you have  with your health care provider.   Document Released: 03/20/2009 Document Revised: 05/28/2013 Document Reviewed: 11/22/2012 Elsevier Interactive Patient Education 2016 [place].   Moderate Conscious Sedation, Adult, Care After Refer to this sheet in the next few weeks. These instructions provide you with information on caring for yourself after your procedure. Your health care provider may also give you more specific instructions. Your treatment has been planned according to current medical practices, but problems sometimes occur. Call your health care provider if you have any problems or questions after your procedure. WHAT TO EXPECT AFTER THE PROCEDURE  After your procedure:  You may feel sleepy, clumsy, and have poor balance for several hours.  Vomiting may occur if you eat too soon after the procedure. HOME CARE INSTRUCTIONS  Do not participate in any activities where you could become injured for at least 24 hours. Do not:  Drive.  Swim.  Ride a bicycle.  Operate heavy machinery.  Cook.  Use power tools.  Climb ladders.  Work from a high place.  Do not make important decisions or sign legal documents until you are improved.  If you vomit, drink water, juice, or soup when you can drink without vomiting. Make sure you have little or no nausea before eating solid foods.  Only take over-the-counter or prescription medicines for pain, discomfort, or fever as directed by your health care provider.  Make sure you and your family fully understand everything about the medicines given to you, including what side effects may occur.  You should not drink alcohol, take sleeping pills, or take medicines that cause drowsiness for at least 24 hours.  If you smoke, do not smoke without supervision.  If you are feeling better, you may resume normal activities 24 hours after you were sedated.  Keep all appointments with your health care provider. SEEK MEDICAL CARE IF:  Your  skin is pale or bluish in color.  You continue to feel nauseous or vomit.  Your pain is getting worse and is not helped by medicine.  You have bleeding or swelling.  You are still sleepy or feeling clumsy after 24 hours. SEEK IMMEDIATE MEDICAL CARE IF:  You develop a rash.  You have difficulty breathing.  You develop any type of allergic problem.  You have a fever. MAKE SURE YOU:  Understand these instructions.  Will watch your condition.  Will get help right away if you are not doing well or get worse.   This information is not intended to replace advice given to you by your health care provider. Make sure you discuss any questions you have with your health care provider.   Document Released: 03/13/2013 Document Revised: 06/13/2014 Document Reviewed: 03/13/2013 Elsevier Interactive Patient Education [employer].

## 2015-06-12 NOTE — Op Note (Signed)
INDICATIONS: atrial fibrillation  PROCEDURE:   Informed consent was obtained prior to the procedure. The risks, benefits and alternatives for the procedure were discussed and the patient comprehended these risks.  Risks include, but are not limited to, cough, sore throat, vomiting, nausea, somnolence, esophageal and stomach trauma or perforation, bleeding, low blood pressure, aspiration, pneumonia, infection, trauma to the teeth and death.    After a procedural time-out, the oropharynx was anesthetized with 20% benzocaine spray. The patient was given IV propofol (Anesthesiology, Dr. Oletta Lamas) for  sedation.   The transesophageal probe was inserted in the esophagus and stomach without difficulty and multiple views were obtained.  The patient was kept under observation until the patient left the procedure room.  The patient left the procedure room in stable condition.   Agitated microbubble saline contrast was not administered.  COMPLICATIONS:    There were no immediate complications.  FINDINGS:  No LA clot, normal LV function.  RECOMMENDATIONS:    Proceed with DC cardioversion   Time Spent Directly with the Patient:  30 minutes   Jerry [name] 06/12/2015, 2:35 PM

## 2015-06-12 NOTE — CV Procedure (Signed)
Procedure: Electrical Cardioversion Indications:  Atrial Fibrillation  Procedure Details:  Consent: Risks of procedure as well as the alternatives and risks of each were explained to the (patient/caregiver).  Consent for procedure obtained.  Time Out: Verified patient identification, verified procedure, site/side was marked, verified correct patient position, special equipment/implants available, medications/allergies/relevent history reviewed, required imaging and test results available.  Performed  Patient placed on cardiac monitor, pulse oximetry, supplemental oxygen as necessary.  Sedation given: IV propofol, Dr. Oletta Lamas Pacer pads placed anterior and posterior chest.  Cardioverted 1 time(s).  Cardioversion with synchronized biphasic 120J shock.  Evaluation: Findings: Post procedure EKG shows: NSR. Very frequent PACs and blocked PACs were seen early on, gradually diminishing in frequency Complications: None Patient did tolerate procedure well.  Time Spent Directly with the Patient:  30 minutes   Jerry [name] 06/12/2015, 2:37 PM

## 2015-06-12 NOTE — Anesthesia Postprocedure Evaluation (Signed)
Anesthesia Post Note  Patient: Jerry [name]  Procedure(s) Performed: Procedure(s) (LRB): CARDIOVERSION (N/A) TRANSESOPHAGEAL ECHOCARDIOGRAM (TEE) (N/A)  Patient location during evaluation: Endoscopy Anesthesia Type: MAC Level of consciousness: awake Pain management: pain level controlled Vital Signs Assessment: post-procedure vital signs reviewed and stable Respiratory status: spontaneous breathing Cardiovascular status: stable Anesthetic complications: no    Last Vitals:  Filed Vitals:   06/12/15 1510 06/12/15 1520  BP: 112/62 127/75  Pulse: 68 74  Temp:    Resp: 17 15    Last Pain: There were no vitals filed for this visit.               EDWARDS,Starlena Beil

## 2015-06-12 NOTE — Progress Notes (Signed)
Echocardiogram Echocardiogram Transesophageal has been performed.  Joelene Millin 06/12/2015, 3:01 PM

## 2015-06-12 NOTE — Transfer of Care (Signed)
Immediate Anesthesia Transfer of Care Note  Patient: Jerry [name]  Procedure(s) Performed: Procedure(s): CARDIOVERSION (N/A) TRANSESOPHAGEAL ECHOCARDIOGRAM (TEE) (N/A)  Patient Location: Endoscopy Unit  Anesthesia Type:MAC  Level of Consciousness: awake, alert , oriented and patient cooperative  Airway & Oxygen Therapy: Patient Spontanous Breathing and Patient connected to nasal cannula oxygen  Post-op Assessment: Report given to RN, Post -op Vital signs reviewed and stable, Patient moving all extremities and Patient moving all extremities X 4  Post vital signs: Reviewed and stable  Last Vitals:  Filed Vitals:   06/12/15 1314  BP: 132/71  Pulse: 84  Resp: 23    Complications: No apparent anesthesia complications

## 2015-06-12 NOTE — H&P (View-Only) (Signed)
06/09/2015 Jerry [name]   1929-07-16  [id number]  Primary Physician [name], MD Primary Cardiologist: [name]   Reason for Visit/CC: Dyspnea on Exertion  HPI:  82 y/o male, followed by [name], who presents to [facility] with a complaint of DOE x the past 2 weeks. His PMH is significant for CAD/ previous MI, s/p PTCA of LCx 1996; BMS-RCA [facility]) 2009; Staged PCI RCA (70% ISR) --> 3.0 mm x 23 mm BMS, staged PCI Diag - 2.0 mm x 12 mm Mini Vision BMS (2.25 mm) in 2011, PACs, HTN and HLD. He had a Myoview in 04/2012 that showed a fixed mid inferior-inferolateral defect/ infarct with no ischemia. EF was 53%. His most recent echocardiogram 09/2012 also showed normal EF of 55-60% w/ G2DD, aortic sclerosis w/o stenosis and mild PAH. His last OV with [name] was 08/2014. He did complain of chronic exertional dyspnea at that time w/o CP, but it was felt to be secondary to poor conditioning. However, stress testing was discussed but, per note, the patient was reluctant to pursue. [name] noted that if symptoms worsened, to re-visit idea of ischemic w/u to r/o cardiac etiology.   Today in clinic, he notes progressive symptoms over the last 2 weeks. He denies any significant weight gain. No chest pain. He symptoms at rest. Only with exertion. He denies syncope/ near syncope.   EKG today shows new-onset atrial fibrillation with a CVR in the 70s. BP is stable. He is on metoprolol and verapamil. BP is stable.      Current Outpatient Prescriptions  Medication Sig Dispense Refill  . amoxicillin (AMOXIL) 500 MG capsule Take 2,000 mg by mouth See admin instructions. Takes before dentist appointment    . atorvastatin (LIPITOR) 40 MG tablet Take 40 mg by mouth daily.    . cholecalciferol (VITAMIN D) 1000 UNITS tablet Take 1,000 Units by mouth daily.    . diazepam (VALIUM) 2 MG tablet Take 2 mg by mouth daily as needed for anxiety.     [name] esomeprazole (NEXIUM) 40 MG capsule Take 1  capsule (40 mg total) by mouth daily. 90 capsule 3  . hydrochlorothiazide (HYDRODIURIL) 12.5 MG tablet Take 12.5 mg by mouth every morning.    [name] HYDROmorphone (DILAUDID) 2 MG tablet Take 2 mg by mouth every 4 (four) hours as needed for pain.    [name] losartan (COZAAR) 100 MG tablet Take 1 tablet (100 mg total) by mouth daily. 90 tablet 1  . meloxicam (MOBIC) 15 MG tablet Take 15 mg by mouth daily.    . metoprolol succinate (TOPROL-XL) 25 MG 24 hr tablet Take 1 tablet (25 mg total) by mouth daily. 90 tablet 3  . mirabegron ER (MYRBETRIQ) 25 MG TB24 tablet Take 25 mg by mouth daily.    . nitroGLYCERIN (NITROSTAT) 0.4 MG SL tablet Place 0.4 mg under the tongue every 5 (five) minutes as needed. For chest pain    . Polyethylene Glycol 3350 (MIRALAX PO) Take 17 g by mouth daily as needed (Constipation).     . psyllium (METAMUCIL) 58.6 % powder Take 1 packet by mouth daily as needed (Fiber).     . solifenacin (VESICARE) 10 MG tablet Take 10 mg by mouth daily.    . Tamsulosin HCl (FLOMAX) 0.4 MG CAPS Take 0.4 mg by mouth daily after supper.    . traMADol (ULTRAM) 50 MG tablet Take 50 mg by mouth 2 (two) times daily as needed.    . triamcinolone cream (KENALOG)  0.5 % Apply 1 application topically daily as needed (Skin).     . verapamil (VERELAN PM) 240 MG 24 hr capsule Take 1 capsule (240 mg total) by mouth daily. 90 capsule 3  . apixaban (ELIQUIS) 5 MG TABS tablet Take 1 tablet (5 mg total) by mouth 2 (two) times daily. 60 tablet 11   No current facility-administered medications for this visit.    Allergies  Allergen Reactions  . Crab [Shellfish Allergy]     Severe rash  . Hydrocodone Hives  . Adhesive [Tape] Rash  . Oxycodone Rash    Social History   Social History  . Marital Status: Married    Spouse Name: N/A  . Number of Children: N/A  . Years of Education: N/A   Occupational History  . Not on file.   Social History Main Topics  . Smoking status: Former Smoker    Types: Pipe     Quit date: 03/12/1993  . Smokeless tobacco: Not on file     Comment: quit smoking pipe about 62yrs ago  . Alcohol Use: Yes     Comment: glass of wine daily  . Drug Use: No  . Sexual Activity: Not Currently   Other Topics Concern  . Not on file   Social History Narrative   He is a married father of 2, grandfather of 2. He exercises at least 3 to 4 times a week but does something 6 to 7 days a week. He does not smoke. Drinks occasional alcohol.      Review of Systems: General: negative for chills, fever, night sweats or weight changes.  Cardiovascular: negative for chest pain, + for dyspnea on exertion, edema, negative for orthopnea, palpitations, paroxysmal nocturnal dyspnea or shortness of breath Dermatological: negative for rash Respiratory: negative for cough or wheezing Urologic: negative for hematuria Abdominal: negative for nausea, vomiting, diarrhea, bright red blood per rectum, melena, or hematemesis Neurologic: negative for visual changes, syncope, or dizziness All other systems reviewed and are otherwise negative except as noted above.    Blood pressure 120/72, pulse 78, height 5\' 9"  (1.753 m), weight 224 lb (101.606 kg), SpO2 96 %.  General appearance: alert, cooperative and no distress Neck: no carotid bruit and no JVD Lungs: clear to auscultation bilaterally Heart: irregularly irregular rhythm and regular rate Extremities: no LEE Pulses: 2+ and symmetric Skin: warm and dry Neurologic: Grossly normal  EKG atrial fibrillation with a CVR, 78 bpm   ASSESSMENT AND PLAN:    1. New-Onset Atrial Fibrillation: this is likely the cause of his DOE. Luckily, he is rate controlled on metoprolol and verapamil. I have discussed case with Dr. Caryl Comes, DOD. Given a CHA2DS2 VASc score of 4 for Age >75, HTN and vascular disease, we will place him on oral anticoagulation with Eliquis. His last BMP showed normal renal function with SCr <1.5. Thus, we will plan to place him on 5 mg BID.  We will check a BMP today along with a CBC to assess baseline H/H. We will also assess potassium level on BMP and will check a Mg level and TSH. As it has been >1 yr since his last coronary stent, we will discontinue his Plavix. Dr. Caryl Comes has also recommended DCCV. Patient has been given option of TEE guided DCCV soon vs waiting for 3 weeks on anticoagulation then pursuing DCCV only. However, he is not ready to make a decision just yet. He wishes to discuss options further with his wife. He was given patient information on  atrial fibrillation, cardioversion and TEE. He will make a decision and call our office tomorrow with plan. He understands that regardless, he is to continue Eliquis uninterrupted. Per Dr. Caryl Comes, we will also check a 2D echo to reassess LVF and wall motion.    2. CAD: h/o prior MI + s/p PTCA of LCx 1996; BMS-RCA [facility]) 2009; Staged PCI RCA (70% ISR) --> 3.0 mm x 23 mm BMS, staged PCI Diag - 2.0 mm x 12 mm Mini Vision BMS (2.25 mm). He denies CP. It has been >1 yr since his last stent placement. We will discontinue his Plavix, since we are starting Eliquis. May consider stress test following cardioversion if still with DOE.   3. HTN: BP is well controlled on current regimen.   4. HLD:  Continue statin.   Note: >45 minutes of direct patient care + teaching/ patient education + time addressing questions was dedicated to today's visit.   Lyda Jester PA-C 06/09/2015 3:58 PM

## 2015-06-15 ENCOUNTER — Encounter (HOSPITAL_COMMUNITY): Payer: Self-pay | Admitting: Cardiovascular Disease

## 2015-06-22 ENCOUNTER — Ambulatory Visit (HOSPITAL_COMMUNITY): Payer: Medicare Other | Attending: Cardiovascular Disease

## 2015-06-22 ENCOUNTER — Other Ambulatory Visit: Payer: Self-pay

## 2015-06-22 DIAGNOSIS — I4891 Unspecified atrial fibrillation: Secondary | ICD-10-CM | POA: Diagnosis not present

## 2015-06-22 DIAGNOSIS — I517 Cardiomegaly: Secondary | ICD-10-CM | POA: Diagnosis not present

## 2015-06-22 DIAGNOSIS — R0609 Other forms of dyspnea: Secondary | ICD-10-CM | POA: Diagnosis not present

## 2015-06-25 ENCOUNTER — Encounter: Payer: Self-pay | Admitting: Physician Assistant

## 2015-06-25 ENCOUNTER — Ambulatory Visit (INDEPENDENT_AMBULATORY_CARE_PROVIDER_SITE_OTHER): Payer: Medicare Other | Admitting: Physician Assistant

## 2015-06-25 VITALS — BP 82/60 | HR 71 | Ht 70.0 in | Wt 216.0 lb

## 2015-06-25 DIAGNOSIS — Z7901 Long term (current) use of anticoagulants: Secondary | ICD-10-CM | POA: Diagnosis not present

## 2015-06-25 DIAGNOSIS — I10 Essential (primary) hypertension: Secondary | ICD-10-CM | POA: Diagnosis not present

## 2015-06-25 DIAGNOSIS — R0609 Other forms of dyspnea: Secondary | ICD-10-CM | POA: Diagnosis not present

## 2015-06-25 DIAGNOSIS — I4891 Unspecified atrial fibrillation: Secondary | ICD-10-CM

## 2015-06-25 MED ORDER — APIXABAN 5 MG PO TABS: 5.0000 mg | ORAL_TABLET | Freq: Two times a day (BID) | ORAL | Status: DC

## 2015-06-25 NOTE — Progress Notes (Signed)
Cardiology Office Note   Date:  06/25/2015   ID:  [name], DOB [birth year], MRN [id number]  PCP:  [name], MD  Cardiologist:  Dr Nolon Stalls, PA-C   Chief Complaint  Patient presents with  . Follow-up    Cardioversion//pt states normal SOB, and c/o swelling in bilateral ankles.//pt states he has had 3 nosebleeds since taking Eliquis.    History of Present Illness: STCLAIR SZYMBORSKI is a 82 y.o. male with a history of MI w/ PTCA of LCx 1996; BMS-RCA [facility]) 2009; Staged PCI RCA (70% ISR) --> 3.0 mm x 23 mm BMS, staged PCI Diag - 2.0 mm x 12 mm Mini Vision BMS (2.25 mm) in 2011, PACs, HTN and HLD. MV 2013 w/ scar, no isch, EF 53%, echo 2014 w/ nl EF.   Seen in office 01/03 w/ atrial fib, TEE/DCCV performed 01/06, successful, EF nl.  [name] presents for follow-up after his cardioversion.  Mr. Hennington feels that his dyspnea on exertion is at baseline. He has had some lower extremity edema, but this is mostly during the day and he does not wake up with it in the morning. He has some chronic orthopnea which has not changed recently. He denies PND. On a good day, he can walk about a block without stopping to rest. He uses a cane. He does not have home oxygen.  He has no palpitations and no awareness of his heartbeat beating irregular or rapid. He is in atrial fibrillation but it has no awareness of it. He is compliant with his medications.  He has had 3 nosebleeds since his cardioversion. He would treat the nosebleeds with Afrin nasal spray and putting Kleenex up his nose, and they would resolve. There was no precipitating event. They were all on the left side. He has had a problem with this in the past, and saw an ENT physician, having something cauterized. He does not remember any additional details.  If he needs an EP doctor, he requests Dr. Caryl Comes, whom he met when he was being set up for the cardioversion.  Past Medical History    Diagnosis Date  . Hypertension     Labile -- takes Verapamil,Losartan,and Metoprolol daily   . Peripheral edema     takes HCTZ daily  . Hyperlipidemia     takes Lipitor nightly  . CAD S/P percutaneous coronary angioplasty 1996, 2009, 01/2010    PTCA of L Cx 1996; BMS-RCA [facility]) 2009; Staged PCI RCA (70% ISR) --> 3.0 mm x 23 mm BMS, staged PCI Diag - 2.0 mm x 12 mm Mini Vision BMS (2.25 mm)  . Myocardial infarction [facility]) 1996, 2009    x 2;last time was about 8-29yr ago   . Osteoarthritis of both knees   . Chronic back pain     scoliosis--pt states unable to have surgery bc of age  . GERD (gastroesophageal reflux disease)     takes Nexium daily  . History of colon polyps   . Urinary frequency     takes Vesicare and Flomax daily  . Urinary urgency   . Prostate cancer ([place] 2005    seed implant  . Cataracts, bilateral     immature  . Anxiety     takes Valium prn  . Bleeding nose     occasionally  . Obesity (BMI 30.0-34.9)     Last BMI ~31;   . OSA (obstructive sleep apnea)   . Heart palpitations  PACs, short bursts of PSVT    Past Surgical History  Procedure Laterality Date  . Right knee arthrsoscopy  63yr ago  . Cyst removed  in college  . Coronary angioplasty with stent placement  1996, 2009, August 2011    PTCA of L Cx 1996; [facility]; Staged PCI RCA (70% ISR) --> 3.0 mm x 23 mm BMS, staged PCI Diag - 2.0 mm x 12 mm Mini Vision BMS (2.25 mm)  . Wisdom teeth extraccted    . Colonoscopy    . Seeds placed into prostate   2005  . Cholecystectomy    . Neuroplasty / transposition median nerve at carpal tunnel bilateral    . Right trigger finger    . Total knee arthroplasty  05/09/2012    Procedure: TOTAL KNEE ARTHROPLASTY;  Surgeon: FKerin Salen MD;  [place] [facility] [place]  Service: Orthopedics;  Laterality: Right;  . Transthoracic echocardiogram  09/26/2012    EF 55-60%. Grade 2 diastolic dysfunction with moderate concentric LVH. Aortic sclerosis.  .Marland KitchenNm  myoview ltd  November 2013    EF 53%; fixed mid inferior-inferolateral defect, infarct with no ischemia.  .Marland KitchenNm myoview ltd  11/24/2010    ejection fraction 58%,left ventricle is normal size ,no evidence of inducible myocardial ischemia  . Cardioversion N/A 06/12/2015    Procedure: CARDIOVERSION;  Surgeon: MSanda Klein MD;  Location: [facility] ENDOSCOPY;  Service: Cardiovascular;  Laterality: N/A;  . Tee without cardioversion N/A 06/12/2015    Procedure: TRANSESOPHAGEAL ECHOCARDIOGRAM (TEE);  Surgeon: MSanda Klein MD;  [facility];  Service: Cardiovascular;  Laterality: N/A;    Current Outpatient Prescriptions  Medication Sig Dispense Refill  . acetaminophen (TYLENOL) 500 MG tablet Take 1,000 mg by mouth daily as needed (pain).    .Marland Kitchenamoxicillin (AMOXIL) 500 MG capsule Take 2,000 mg by mouth See admin instructions. Take 4 capsules (2000 mg) by mouth one hour before dental appointment    . apixaban (ELIQUIS) 5 MG TABS tablet Take 1 tablet (5 mg total) by mouth 2 (two) times daily. 60 tablet 11  . atorvastatin (LIPITOR) 40 MG tablet Take 40 mg by mouth at bedtime.     . cholecalciferol (VITAMIN D) 1000 UNITS tablet Take 1,000 Units by mouth at bedtime.     . diazepam (VALIUM) 2 MG tablet Take 2 mg by mouth daily as needed for anxiety.     . diphenhydrAMINE (BENADRYL) 25 MG tablet Take 25 mg by mouth at bedtime.    .Marland Kitchenesomeprazole (NEXIUM) 40 MG capsule Take 1 capsule (40 mg total) by mouth daily. 90 capsule 3  . hydrochlorothiazide (HYDRODIURIL) 12.5 MG tablet Take 12.5 mg by mouth daily.     . hydrocortisone cream 1 % Apply 1 application topically at bedtime.    .Marland KitchenHYDROmorphone (DILAUDID) 2 MG tablet Take 2 mg by mouth every 4 (four) hours as needed for pain.    .Marland Kitchenlosartan (COZAAR) 100 MG tablet Take 1 tablet (100 mg total) by mouth daily. 90 tablet 1  . magnesium hydroxide (MILK OF MAGNESIA) 400 MG/5ML suspension Take 30 mLs by mouth daily as needed for mild constipation.    . meloxicam  (MOBIC) 15 MG tablet Take 15 mg by mouth daily.    . metoprolol succinate (TOPROL-XL) 25 MG 24 hr tablet Take 1 tablet (25 mg total) by mouth daily. 90 tablet 3  . mirabegron ER (MYRBETRIQ) 25 MG TB24 tablet Take 25 mg by mouth at bedtime.     . nitroGLYCERIN (  NITROSTAT) 0.4 MG SL tablet Place 0.4 mg under the tongue every 5 (five) minutes as needed for chest pain.     . Polyethylene Glycol 3350 (MIRALAX PO) Take 17 g by mouth daily as needed (Constipation).     . Psyllium (METAMUCIL PO) Take 15 mLs by mouth daily. Mix in 8 oz liquid and drink    . tamsulosin (FLOMAX) 0.4 MG CAPS capsule Take 1 capsule by mouth at bedtime.    . verapamil (VERELAN PM) 240 MG 24 hr capsule Take 1 capsule (240 mg total) by mouth daily. (Patient taking differently: Take 240 mg by mouth at bedtime. ) 90 capsule 3   No current facility-administered medications for this visit.    Allergies:   Crab; Hydrocodone; Adhesive; and Oxycodone    Social History:  The patient  reports that he quit smoking about 22 years ago. His smoking use included Pipe. He does not have any smokeless tobacco history on file. He reports that he drinks alcohol. He reports that he does not use illicit drugs.   Family History:  The patient's family history is not on file.    ROS:  Please see the history of present illness. All other systems are reviewed and negative.    PHYSICAL EXAM:  VS:  BP 82/60 mmHg L, 104/64 R Pulse 71  Ht 5' 10"  (1.778 m)  Wt 216 lb (97.977 kg)  BMI 30.99 kg/m2 , BMI Body mass index is 30.99 kg/(m^2). GEN: Well nourished, well developed, elderly male in no acute distress HEENT: normal for age  Neck: no JVD, soft R carotid bruit, no masses Cardiac: Irregular R&R; soft murmur, no rubs, or gallops Respiratory:  clear to auscultation bilaterally, normal work of breathing GI: soft, nontender, nondistended, + BS MS: no deformity or atrophy; trace pedal edema; distal pulses are 2+ in all 4 extremities  Skin: warm  and dry, no rash Neuro:  Strength and sensation are intact Psych: euthymic mood, full affect   EKG:  EKG is ordered today. The ekg ordered today demonstrates atrial fibrillation, rate 71 with normal intervals and no acute ST/T-wave changes from prior   Recent Labs: 07/29/2014: ALT <8 06/09/2015: BUN 23; Creat 1.06; Hemoglobin 10.1*; Magnesium 1.8; Platelets 300; Potassium 4.6; Sodium 140; TSH 3.437    Lipid Panel    Component Value Date/Time   CHOL 155 07/29/2014 0943   CHOL 143 02/12/2014 1039   TRIG 105 07/29/2014 0943   TRIG 103 02/12/2014 1039   HDL 51 07/29/2014 0943   HDL 49 02/12/2014 1039   CHOLHDL 2.9 02/12/2014 1039   VLDL 21 02/12/2014 1039   LDLCALC 83 07/29/2014 0943   LDLCALC 73 02/12/2014 1039     Wt Readings from Last 3 Encounters:  06/25/15 216 lb (97.977 kg)  06/12/15 224 lb (101.606 kg)  06/09/15 224 lb (101.606 kg)     Other studies Reviewed: Additional studies/ records that were reviewed today include: Office Notes, hospital records and labs.  ASSESSMENT AND PLAN:  1.  Atrial fibrillation: I discussed rate control versus rhythm control with the patient and his wife. Advised to did not need any medication changes at this time because his rate is controlled. He ambulated and his heart rate got up to 129 at the very end of ambulation, but was generally right around 100 while he was walking.His heart rate normalized very quickly. He is not having any symptoms clearly attributable to the atrial fibrillation. Continue current medications for now.  [name] to review  data and determine if EP referral or if pt should follow-up with him.  2. DOE: He ambulated 2 laps around the office. At the end of the ambulation he was a little short of breath but felt that this would be normal for him. His oxygen saturation ranged between 94-96 percent. He has no significant volume overload by exam. He was advised that atrial fibrillation could cause volume overload and he  should check his weight daily.  3. Nosebleeds: He has had a problem with this in the past and been treated by ENT MD. He was requested to use saline nasal spray 3 times a day to try and prevent the nosebleeds. If he gets a nosebleed, he is to lean forward and used direct pressure to try and get the bleeding stopped instead of the Afrin if at all possible. He was advised to please not put Kleenex up his nose. If his nosebleeds continue, perhaps there is a treatable cause. Otherwise, we will have to assess the risk versus benefit of anticoagulation.  4. Chronic anticoagulation: Eliquis. CHADS2VASC=4. He has been taking Eliquis continuously since 06/09/2015. He has not missed any doses.  5. Hypertension: According to his blood pressure records his diastolic pressure goes as high as 625 and his systolic can be in the 638L. He has a long history of labile hypertension. In the office today his blood pressure was lower in one arm than the other one. However he was asymptomatic. As his blood pressure runs high at times and he is asymptomatic, no medication changes.   Current medicines are reviewed at length with the patient today.  The patient does not have concerns regarding medicines.  The following changes have been made:  no change  Labs/ tests ordered today include:   Orders Placed This Encounter  Procedures  . EKG 12-Lead     Disposition:   FU with Dr Ellyn Hack  Signed, Lenoard Aden  06/25/2015 11:24 AM    [facility] [place], [place], [place]  [address] Phone: [phone]; Fax: [phone]

## 2015-06-25 NOTE — Patient Instructions (Signed)
Medication Instructions:  Your physician has recommended you make the following change in your medication:  Your provider recommends that you use Saline Nasal Spray for your nose THREE times a day   Labwork: none  Testing/Procedures: non  Follow-Up: Your physician wants you to follow-up in: 3 months with [name]. You will receive a reminder letter in the mail two months in advance. If you don't receive a letter, please call our office to schedule the follow-up appointment.  Set up and appointment with your Ear Nose and Throat physician.   Any Other Special Instructions Will Be Listed Below (If Applicable).  Your provider recommends that you increase your activity.  Your physician recommends that you weigh, daily, at the same time every day, and in the same amount of clothing. Please record your daily weights on the handout provided and bring it to your next appointment.      If you need a refill on your cardiac medications before your next appointment, please call your pharmacy.

## 2015-06-26 ENCOUNTER — Telehealth: Payer: Self-pay | Admitting: Physician Assistant

## 2015-06-26 ENCOUNTER — Telehealth: Payer: Self-pay | Admitting: Cardiovascular Disease

## 2015-06-26 NOTE — Telephone Encounter (Signed)
Wrong provider

## 2015-06-26 NOTE — Telephone Encounter (Signed)
Spoke with mike, questions regarding weight and creatine answered.

## 2015-06-26 NOTE — Telephone Encounter (Signed)
Want to verify dosage of his Eliquis. Reference#-[id number]

## 2015-07-02 ENCOUNTER — Telehealth: Payer: Self-pay | Admitting: Cardiology

## 2015-07-02 NOTE — Telephone Encounter (Signed)
F/u ° ° °Pt returning your call °

## 2015-07-02 NOTE — Telephone Encounter (Signed)
Returned call to patient no answer.LMTC. 

## 2015-07-02 NOTE — Telephone Encounter (Signed)
Returned call to patient he stated he had 3 questions to ask Dr.Harding. #1 Stated when he saw PA after cardioversion she mentioned she would talk to [place] about seeing EP clinic and he has not heard. #2 He has been taking mobic for arthritis and since he is on Eliquis is this ok to continue to take  #3 He will be having teeth cleaned in a couple of weeks does he need to hold eliquis.Message sent to [place] and his nurse Trixie Dredge RN.

## 2015-07-02 NOTE — Telephone Encounter (Signed)
New message     pateint calling have 2 questions for Physician     1. Patient was seen by APP last week  - status of Afib clinic.  Cardioversion did not work    Pt c/o medication issue:  1. Name of Medication: meloxicam  15 mg   2. How are you currently taking this medication (dosage and times per day)? One a day at night    3. Are you having a reaction (difficulty breathing--STAT)? No    4. What is your medication issue? Concern about the warning.

## 2015-07-05 NOTE — Telephone Encounter (Signed)
#  1 - I will review the clinic report, and see if it makes sense for him to go see EP #2 - it is okay to take Mobic when necessary while on ELIQUIS, just know that it increases risk of bleeding. Should not be taking aspirin if taking Mobic  #3 - he needs to stay on ELIQUIS for at least 4 weeks following his cardioversion, then okay to hold for 24 hours prior to teeth cleaning -- needs to be on uninterrupted and regulation for 4 weeks after cardioversion  HARDING, Jerry Green, MD

## 2015-07-06 ENCOUNTER — Telehealth: Payer: Self-pay | Admitting: Cardiology

## 2015-07-06 NOTE — Telephone Encounter (Signed)
Recommendations communicated to patient.   He has been on the mobic long term. Advised probably good to consider alternative for routine pain relief. Will defer to [name] for recommendations on this and EP.  Pt also notes he has been having occ nosebleeds. He is to follow up w/ ENT on this. These resolve w/in 10-30 minutes. Advised if prolonged - 45-60 mins consider Urgent Care.

## 2015-07-06 NOTE — Telephone Encounter (Signed)
See other encounter note.

## 2015-07-06 NOTE — Telephone Encounter (Signed)
New message    Patient has not heard anything from the nurse since last week.

## 2015-07-06 NOTE — Telephone Encounter (Signed)
So,  I do not think that we need EP yet -- seems rate controlled & relatively asymptomatic.   If we choose rhythm control, would refer to EP.  Nosebleeds are another reason to avoid NSAIDS like Mobic while on Eliquis.  Keeping the nares moist is crucial for avoiding nosebleeds.  If they are persistent, recommend seeing ENT to see if anything else can be done.  Leonie Man, MD

## 2015-07-09 DIAGNOSIS — R040 Epistaxis: Secondary | ICD-10-CM | POA: Diagnosis not present

## 2015-07-09 DIAGNOSIS — Z7901 Long term (current) use of anticoagulants: Secondary | ICD-10-CM | POA: Diagnosis not present

## 2015-07-13 ENCOUNTER — Telehealth: Payer: Self-pay | Admitting: Cardiology

## 2015-07-13 NOTE — Telephone Encounter (Signed)
New message      Pt states he had a cardioversion and it did not work.  He is still in AFIB.  Please call with recommendations.  Should he be seen in the AFIB clinic? Will the doctor want to adjust his medications?  Please call

## 2015-07-13 NOTE — Telephone Encounter (Signed)
Good recommendations.  [place]

## 2015-07-13 NOTE — Telephone Encounter (Signed)
Pt noting a little fatigued, he has sometimes SOB but notes this is not very noticeable. He had cardioversion 1 month ago and went back into A Fib afterwards.  He doesn't outline any other specific acute symptoms. Has some difficulty in getting his pulse rate when checking. We discussed some methods, mainly trying to get manual pulse instead of w/ BP cuff.  Advised to check HR, BP at home, if HR 100+, BP over 100/60 OK to take extra 1/2 dose of metoprolol to see if this would alleviate symptoms.   Noted if not better today to call, could set up for A Fib clinic or EP referral as warranted. Would sent to [name] for further recommendations. Pt voiced understanding of instructions.

## 2015-07-14 ENCOUNTER — Telehealth: Payer: Self-pay | Admitting: Cardiology

## 2015-07-14 NOTE — Telephone Encounter (Signed)
Jerry [name] is calling to report how taking two beta blockers(Metoprolol)  worked. He feels a little bit slower and he has taken his blood pressure and his question is what is he suppose to do next? Do he keeps doubling the Metoprolol . Please call    Thanks

## 2015-07-14 NOTE — Telephone Encounter (Signed)
Patient is waiting for Dr. Allison Quarry decision for him to go to Afib clinic Afib clinic has opening Wednesday and Thursday Had Cardioversion on 1/6 was back in afib at office visit on 1/19 Had increased his metorprolo by 1/2 yesterday ( normal dose is 25 mg )

## 2015-07-14 NOTE — Telephone Encounter (Signed)
So - I really am not sure what his taking now. I have not seen him since Afib Diagnosis to understand his symptoms. If he is not noting any worsening of exertional dyspnea - we can continue with rate control. According to Rhonda's note, he was not aware of being in Afib.     I only see that he is taking Metoprolol - what other BB is he taking.  He is on Verapamil also.   I think it is best to stay on a standing dose & use PRN additional dose of BB for rapid HR. I would prefer to give rate control a fare shot before considering rhythm control - i.e. Referring to EP.  [place]

## 2015-07-15 ENCOUNTER — Telehealth: Payer: Self-pay | Admitting: Cardiology

## 2015-07-15 NOTE — Telephone Encounter (Signed)
So - how much Metoprolol is he taking? We can probably increase.  Has BP room. Goal resting HR with Afib is ~60-90.  Fine - have him go to Afib clinic --> too hard to manage over the phone & not having seen him.  Leonie Man, MD

## 2015-07-15 NOTE — Telephone Encounter (Signed)
Received records from [facility] for appointment on 09/24/15 with Dr Ellyn Hack.  Records given to [facility] (medical records) for Dr Allison Quarry schedule on 09/24/15. lp

## 2015-07-15 NOTE — Telephone Encounter (Signed)
I returned call to patient.  He had some improvement w/ extra metoprolol doses as discussed Monday, but notes still having symptoms. Pt obtained new BP cuff Monday. Gave me readings.  Since Monday, 138/86-153/106 BPs seem to be OK when checked. HR between 80-110, but he is only spot checking these from BP cuff readings - has trouble taking manual pulse. I called Rushie Goltz RN, put on A Fib clinic schedule tomorrow for 8:30.

## 2015-07-16 ENCOUNTER — Other Ambulatory Visit (HOSPITAL_COMMUNITY): Payer: Self-pay | Admitting: *Deleted

## 2015-07-16 ENCOUNTER — Ambulatory Visit (HOSPITAL_COMMUNITY)
Admission: RE | Admit: 2015-07-16 | Discharge: 2015-07-16 | Disposition: A | Discharge status: Home / Self Care | Payer: Medicare Other | Source: Ambulatory Visit | Attending: Nurse Practitioner | Admitting: Nurse Practitioner

## 2015-07-16 VITALS — BP 122/76 | HR 86 | Ht 68.0 in | Wt 219.5 lb

## 2015-07-16 DIAGNOSIS — I252 Old myocardial infarction: Secondary | ICD-10-CM | POA: Insufficient documentation

## 2015-07-16 DIAGNOSIS — Z885 Allergy status to narcotic agent status: Secondary | ICD-10-CM | POA: Insufficient documentation

## 2015-07-16 DIAGNOSIS — Z87891 Personal history of nicotine dependence: Secondary | ICD-10-CM | POA: Diagnosis not present

## 2015-07-16 DIAGNOSIS — I2510 Atherosclerotic heart disease of native coronary artery without angina pectoris: Secondary | ICD-10-CM | POA: Diagnosis not present

## 2015-07-16 DIAGNOSIS — Z79899 Other long term (current) drug therapy: Secondary | ICD-10-CM | POA: Insufficient documentation

## 2015-07-16 DIAGNOSIS — I10 Essential (primary) hypertension: Secondary | ICD-10-CM | POA: Insufficient documentation

## 2015-07-16 DIAGNOSIS — G8929 Other chronic pain: Secondary | ICD-10-CM | POA: Diagnosis not present

## 2015-07-16 DIAGNOSIS — Z8546 Personal history of malignant neoplasm of prostate: Secondary | ICD-10-CM | POA: Insufficient documentation

## 2015-07-16 DIAGNOSIS — Z7902 Long term (current) use of antithrombotics/antiplatelets: Secondary | ICD-10-CM | POA: Diagnosis not present

## 2015-07-16 DIAGNOSIS — E876 Hypokalemia: Secondary | ICD-10-CM | POA: Diagnosis not present

## 2015-07-16 DIAGNOSIS — Z6833 Body mass index (BMI) 33.0-33.9, adult: Secondary | ICD-10-CM | POA: Diagnosis not present

## 2015-07-16 DIAGNOSIS — E785 Hyperlipidemia, unspecified: Secondary | ICD-10-CM | POA: Diagnosis not present

## 2015-07-16 DIAGNOSIS — G4733 Obstructive sleep apnea (adult) (pediatric): Secondary | ICD-10-CM | POA: Diagnosis not present

## 2015-07-16 DIAGNOSIS — M549 Dorsalgia, unspecified: Secondary | ICD-10-CM | POA: Insufficient documentation

## 2015-07-16 DIAGNOSIS — I481 Persistent atrial fibrillation: Secondary | ICD-10-CM | POA: Diagnosis not present

## 2015-07-16 DIAGNOSIS — I4819 Other persistent atrial fibrillation: Secondary | ICD-10-CM

## 2015-07-16 DIAGNOSIS — E669 Obesity, unspecified: Secondary | ICD-10-CM | POA: Insufficient documentation

## 2015-07-16 DIAGNOSIS — K219 Gastro-esophageal reflux disease without esophagitis: Secondary | ICD-10-CM | POA: Insufficient documentation

## 2015-07-16 DIAGNOSIS — Z955 Presence of coronary angioplasty implant and graft: Secondary | ICD-10-CM | POA: Diagnosis not present

## 2015-07-16 LAB — COMPREHENSIVE METABOLIC PANEL
ALBUMIN: 3.4 g/dL — AB (ref 3.5–5.0)
ALK PHOS: 58 U/L (ref 38–126)
ALT: 7 U/L — AB (ref 17–63)
AST: 16 U/L (ref 15–41)
Anion gap: 10 (ref 5–15)
BUN: 20 mg/dL (ref 6–20)
CALCIUM: 9 mg/dL (ref 8.9–10.3)
CO2: 25 mmol/L (ref 22–32)
CREATININE: 1.08 mg/dL (ref 0.61–1.24)
Chloride: 106 mmol/L (ref 101–111)
GFR calc non Af Amer: >60 mL/min (ref >60)
GLUCOSE: 146 mg/dL — AB (ref 65–99)
Potassium: 3.8 mmol/L (ref 3.5–5.1)
SODIUM: 141 mmol/L (ref 135–145)
Total Bilirubin: 0.7 mg/dL (ref 0.3–1.2)
Total Protein: 6.6 g/dL (ref 6.5–8.1)

## 2015-07-16 LAB — MAGNESIUM: Magnesium: 1.2 mg/dL — ABNORMAL LOW (ref 1.7–2.4)

## 2015-07-16 MED ORDER — MAGNESIUM 200 MG PO TABS: 200.0000 mg | ORAL_TABLET | Freq: Two times a day (BID) | ORAL | Status: DC

## 2015-07-16 MED ORDER — DILTIAZEM HCL ER COATED BEADS 240 MG PO CP24: 240.0000 mg | ORAL_CAPSULE | Freq: Every day | ORAL | Status: DC

## 2015-07-16 MED ORDER — POTASSIUM CHLORIDE ER 20 MEQ PO TBCR: 20.0000 meq | EXTENDED_RELEASE_TABLET | Freq: Every day | ORAL | Status: DC

## 2015-07-16 NOTE — Patient Instructions (Signed)
Call back once you have thought through options of tikosyn or amiodarone. [phone]

## 2015-07-16 NOTE — Progress Notes (Signed)
Patient ID: Jerry [name], male   DOB: 1929-12-03, 82 y.o.   MRN:  Primary Care Physician: Jerry Coffin, MD Referring Physician: Dr. Juliette Alcide ARTICE [name] is a 82 y.o. male with a h/o of MI w/ PTCA of LCx 1996; BMS-RCA [facility]) 2009; Staged PCI RCA (70% ISR) --> 3.0 mm x 23 mm BMS, staged PCI Diag - 2.0 mm x 12 mm Mini Vision BMS (2.25 mm) in 2011, PACs, HTN and HLD. MV 2013 w/ scar, no isch, EF 53%, echo 2014 w/ nl EF. Pt is being seen in the afib clinic for further treatment of symptomatic afib. New onset 06/09/15, TEE/DCCV performed 01/06, successful, EF nl.Unfortunately, he had ERAF.  He has baseline exertional dyspnea but states his breathing is much worse over the last few months. States last summer he could walk much further. The wife states that they went out to dinner the other night and he scared her that he got so winded with walking just a short distance, she did not think they would make it to the restaurant.   He does drink alcohol every night, he is a wine connoisseur and we discussed that more than two alcoholic drinks a week could increase afib burden, plus put him at risk for bleeding with his DOAC. He has has some nosebleeds since starting . He has two cups of coffee in the am and does not smoke. He is sedentary and is overweight. His wife does not know if he snores, pt denies, does not cpap, but OSA is listed in problem list. because she sleeps in a different room and he sleeps in a recliner. He does not feel palpitations, just fatigue and increased exertional dyspnea.Afib is rate controlled.  Today, he denies symptoms of palpitations, chest pain, presyncope or neurologic sequela. The patient is tolerating medications without difficulties and is otherwise without complaint today.   Past Medical History  Diagnosis Date  . Hypertension     Labile -- takes Verapamil,Losartan,and Metoprolol daily   . Peripheral edema     takes HCTZ daily  . Hyperlipidemia    takes Lipitor nightly  . CAD S/P percutaneous coronary angioplasty 1996, 2009, 01/2010    PTCA of L Cx 1996; BMS-RCA [facility]) 2009; Staged PCI RCA (70% ISR) --> 3.0 mm x 23 mm BMS, staged PCI Diag - 2.0 mm x 12 mm Mini Vision BMS (2.25 mm)  . Myocardial infarction [facility]) 1996, 2009    x 2;last time was about 8-73yrs ago   . Osteoarthritis of both knees   . Chronic back pain     scoliosis--pt states unable to have surgery bc of age  . GERD (gastroesophageal reflux disease)     takes Nexium daily  . History of colon polyps   . Urinary frequency     takes Vesicare and Flomax daily  . Urinary urgency   . Prostate cancer ([place]) 2005    seed implant  . Cataracts, bilateral     immature  . Anxiety     takes Valium prn  . Bleeding nose     occasionally  . Obesity (BMI 30.0-34.9)     Last BMI ~31;   . OSA (obstructive sleep apnea)   . Heart palpitations     PACs, short bursts of PSVT   Past Surgical History  Procedure Laterality Date  . Right knee arthrsoscopy  6yrs ago  . Cyst removed  in college  . Coronary angioplasty with stent placement  1996,  2009, August 2011    PTCA of L Cx 1996; BMS-RCA [facility]) 2009; Staged PCI RCA (70% ISR) --> 3.0 mm x 23 mm BMS, staged PCI Diag - 2.0 mm x 12 mm Mini Vision BMS (2.25 mm)  . Wisdom teeth extraccted    . Colonoscopy    . Seeds placed into prostate   2005  . Cholecystectomy    . Neuroplasty / transposition median nerve at carpal tunnel bilateral    . Right trigger finger    . Total knee arthroplasty  05/09/2012    Procedure: TOTAL KNEE ARTHROPLASTY;  Surgeon: Kerin Salen, MD;  Location: [place];  Service: Orthopedics;  Laterality: Right;  . Transthoracic echocardiogram  09/26/2012    EF 55-60%. Grade 2 diastolic dysfunction with moderate concentric LVH. Aortic sclerosis.  [name] Nm myoview ltd  November 2013    EF 53%; fixed mid inferior-inferolateral defect, infarct with no ischemia.  [name] Nm myoview ltd  11/24/2010    ejection fraction 58%,left  ventricle is normal size ,no evidence of inducible myocardial ischemia  . Cardioversion N/A 06/12/2015    Procedure: CARDIOVERSION;  Surgeon: Sanda Klein, MD;  [facility];  Service: Cardiovascular;  Laterality: N/A;  . Tee without cardioversion N/A 06/12/2015    Procedure: TRANSESOPHAGEAL ECHOCARDIOGRAM (TEE);  Surgeon: Sanda Klein, MD;  [facility];  Service: Cardiovascular;  Laterality: N/A;    Current Outpatient Prescriptions  Medication Sig Dispense Refill  . acetaminophen (TYLENOL) 500 MG tablet Take 1,000 mg by mouth daily as needed (pain).    [name] amoxicillin (AMOXIL) 500 MG capsule Take 2,000 mg by mouth See admin instructions. Take 4 capsules (2000 mg) by mouth one hour before dental appointment    . apixaban (ELIQUIS) 5 MG TABS tablet Take 1 tablet (5 mg total) by mouth 2 (two) times daily. 90 tablet 1  . atorvastatin (LIPITOR) 40 MG tablet Take 40 mg by mouth at bedtime.     . cholecalciferol (VITAMIN D) 1000 UNITS tablet Take 1,000 Units by mouth at bedtime.     . diazepam (VALIUM) 2 MG tablet Take 2 mg by mouth daily as needed for anxiety.     [name] esomeprazole (NEXIUM) 40 MG capsule Take 1 capsule (40 mg total) by mouth daily. 90 capsule 3  . hydrocortisone cream 1 % Apply 1 application topically at bedtime.    [name] HYDROmorphone (DILAUDID) 2 MG tablet Take 2 mg by mouth every 4 (four) hours as needed for pain.    [name] losartan (COZAAR) 100 MG tablet Take 1 tablet (100 mg total) by mouth daily. 90 tablet 1  . magnesium hydroxide (MILK OF MAGNESIA) 400 MG/5ML suspension Take 30 mLs by mouth daily as needed for mild constipation.    . metoprolol succinate (TOPROL-XL) 25 MG 24 hr tablet Take 1 tablet (25 mg total) by mouth daily. 90 tablet 3  . mirabegron ER (MYRBETRIQ) 25 MG TB24 tablet Take 25 mg by mouth at bedtime.     . nitroGLYCERIN (NITROSTAT) 0.4 MG SL tablet Place 0.4 mg under the tongue every 5 (five) minutes as needed for chest pain.     . Polyethylene Glycol  3350 (MIRALAX PO) Take 17 g by mouth daily as needed (Constipation).     . Psyllium (METAMUCIL PO) Take 15 mLs by mouth daily. Mix in 8 oz liquid and drink    . tamsulosin (FLOMAX) 0.4 MG CAPS capsule Take 1 capsule by mouth at bedtime.    [name] diltiazem (CARDIZEM CD) 240 MG  24 hr capsule Take 1 capsule (240 mg total) by mouth daily. 30 capsule 3  . Magnesium 200 MG TABS Take 1 tablet (200 mg total) by mouth 2 (two) times daily. 60 each   . potassium chloride 20 MEQ TBCR Take 20 mEq by mouth daily. 30 tablet 3   No current facility-administered medications for this encounter.    Allergies  Allergen Reactions  . Crab [Shellfish Allergy] Rash  . Hydrocodone Hives  . Adhesive [Tape] Rash  . Oxycodone Rash    Social History   Social History  . Marital Status: Married    Spouse Name: N/A  . Number of Children: N/A  . Years of Education: N/A   Occupational History  . Retired    Social History Main Topics  . Smoking status: Former Smoker    Types: Pipe    Quit date: 03/12/1993  . Smokeless tobacco: Not on file     Comment: quit smoking pipe about 69yrs ago  . Alcohol Use: Yes     Comment: glass of wine daily  . Drug Use: No  . Sexual Activity: Not Currently   Other Topics Concern  . Not on file   Social History Narrative   He is a married father of 2, grandfather of 2. He exercises at least 3 to 4 times a week but does something 6 to 7 days a week. He does not smoke. Drinks occasional alcohol.     No family history on file.  ROS- All systems are reviewed and negative except as per the HPI above  Physical Exam: Filed Vitals:   07/16/15 0850  BP: 122/76  Pulse: 86  Height: 5\' 8"  (1.727 m)  Weight: 219 lb 8 oz (99.565 kg)    GEN- The patient is well appearing, alert and oriented x 3 today.   Head- normocephalic, atraumatic Eyes-  Sclera clear, conjunctiva pink Ears- hearing intact Oropharynx- clear Neck- supple, no JVP Lymph- no cervical lymphadenopathy Lungs-  Clear to ausculation bilaterally, normal work of breathing Heart- Irregular rate and rhythm, no murmurs, rubs or gallops, PMI not laterally displaced GI- soft, NT, ND, + BS Extremities- no clubbing, cyanosis, or edema MS- no significant deformity or atrophy Skin- no rash or lesion Psych- euthymic mood, full affect Neuro- strength and sensation are intact  EKG-afib/flutter with variable AV block, 86 bpm QRS int 94 ms, QTc 449 ms Epic records reviewed Echo- Left ventricle: The cavity size was normal. Systolic function was normal. The estimated ejection fraction was in the range of 60% to 65%. Wall motion was normal; there were no regional wall motion abnormalities. - Left atrium: The atrium was mildly dilated. - Atrial septum: No defect or patent foramen ovale was identified. - Pulmonary arteries: PA peak pressure: 32 mm Hg (S). Labs, Kt 3.8. Mag 1.2, creatinine 1.08, creat clear calc at 72.8 ml/min  Assessment and Plan: 1. Symptomatic persistent  new onset afib with DCCV 1/06/ERAF Pt and wife state that he is much more short of breath with activity than just a few months ago and has diminished his QOL. I discussed antiarrythmics at length with him/wife today, Flecainide is contraindicated due to CAD, which would leave tikosyn, amiodarone. Discussed both drugs, side effects, precautions. After much discussion, he would like to try tikosyn.  He will have to stop benadryl, hctz and verapamil. Discussed verapamil with Fuller Canada, PharmD . She will let me know comparable dose of cardizem to start. He will have to watch for worsening edema and  if needed may have to use low dose lasix. Bmet/mag done today show that he is hypokalemic and hypomagnesemic. Kt and Mag replacement will be started in preparation for tikosyn and when replaced, hospitalization for tikosyn will be arranged.  He will be contacted by PharmD on [facility] [address] for f/u and appropriate scheduling for tikosyn initiation.    Review of renal function is sufficient for tikosyn dosing and prior QTc look also appropriate.  I spent 45 mins  face to face with pt and wife today discussing options.  Geroge Baseman Neetu Carrozza, [facility] [address] [place], [place] [address] [phone]

## 2015-07-16 NOTE — Telephone Encounter (Signed)
Patient was informed to STOP verapamil and start cardizem 240mg  once a day. RX sent to pharmacy. Patient verbalized understanding.

## 2015-07-17 ENCOUNTER — Telehealth: Payer: Self-pay | Admitting: Pharmacist

## 2015-07-17 NOTE — Telephone Encounter (Signed)
Received a message from Roderic Palau in [place] [facility] that pt would like to start Tikosyn. HCTZ and Benadryl were discontinued at office visit with her on 2/10. Reviewed medication list and pt was still taking verapamil - pt will be switching to diltiazem to avoid interaction with Tikosyn. Pt reports that he has not missed any doses of his Eliquis in the last 30 days. BMET and Mg were checked as well, K was low at 3.8 and supplementation was started at 84meq daily, Mg was very low at 1.2 and supplementation was started at 200mg  BID. Spoke with pt to schedule Tikosyn visit in clinic before his hospitalization - scheduling for >1 week out so that he has time for his Mg to increase. Pt scheduled in pharmacy clinic on 2/21 for hopeful admit for Tikosyn load. Pre-cert paperwork filled out.

## 2015-07-18 ENCOUNTER — Telehealth: Payer: Self-pay | Admitting: Physician Assistant

## 2015-07-18 NOTE — Telephone Encounter (Signed)
Patient will have a history of atrial fibrillation called the cardiology after hour service stating that he has taken 2 doses of eliquis last night to 3 hours apart. Apparently he got a new drug dispenser and right before sleep, he mistakenly took a second dose of eliquis.  I have instructed the patient to hold this morning's dose of eliquis and restart eliquis tonight. He has not noticed any significant sign of bleeding, blood in stool or blood in the urine.  Hilbert Corrigan PA Pager: [phone]

## 2015-07-28 ENCOUNTER — Inpatient Hospital Stay (HOSPITAL_COMMUNITY)
Admission: AD | Admit: 2015-07-28 | Discharge: 2015-08-04 | DRG: 308 | Disposition: A | Discharge status: Skilled Nursing Facility | Payer: Medicare Other | Source: Ambulatory Visit | Attending: Internal Medicine | Admitting: Internal Medicine

## 2015-07-28 ENCOUNTER — Ambulatory Visit (INDEPENDENT_AMBULATORY_CARE_PROVIDER_SITE_OTHER): Payer: Medicare Other | Admitting: Pharmacist

## 2015-07-28 VITALS — Wt 218.0 lb

## 2015-07-28 DIAGNOSIS — Z91013 Allergy to seafood: Secondary | ICD-10-CM | POA: Diagnosis not present

## 2015-07-28 DIAGNOSIS — K219 Gastro-esophageal reflux disease without esophagitis: Secondary | ICD-10-CM | POA: Diagnosis present

## 2015-07-28 DIAGNOSIS — R350 Frequency of micturition: Secondary | ICD-10-CM | POA: Diagnosis present

## 2015-07-28 DIAGNOSIS — I481 Persistent atrial fibrillation: Secondary | ICD-10-CM | POA: Diagnosis not present

## 2015-07-28 DIAGNOSIS — I252 Old myocardial infarction: Secondary | ICD-10-CM | POA: Diagnosis not present

## 2015-07-28 DIAGNOSIS — Z91048 Other nonmedicinal substance allergy status: Secondary | ICD-10-CM

## 2015-07-28 DIAGNOSIS — I482 Chronic atrial fibrillation: Secondary | ICD-10-CM | POA: Diagnosis not present

## 2015-07-28 DIAGNOSIS — E669 Obesity, unspecified: Secondary | ICD-10-CM | POA: Diagnosis present

## 2015-07-28 DIAGNOSIS — Z87891 Personal history of nicotine dependence: Secondary | ICD-10-CM

## 2015-07-28 DIAGNOSIS — R2681 Unsteadiness on feet: Secondary | ICD-10-CM | POA: Diagnosis not present

## 2015-07-28 DIAGNOSIS — J9600 Acute respiratory failure, unspecified whether with hypoxia or hypercapnia: Secondary | ICD-10-CM | POA: Insufficient documentation

## 2015-07-28 DIAGNOSIS — Z886 Allergy status to analgesic agent status: Secondary | ICD-10-CM | POA: Diagnosis not present

## 2015-07-28 DIAGNOSIS — J9601 Acute respiratory failure with hypoxia: Secondary | ICD-10-CM | POA: Diagnosis not present

## 2015-07-28 DIAGNOSIS — Z6830 Body mass index (BMI) 30.0-30.9, adult: Secondary | ICD-10-CM | POA: Diagnosis not present

## 2015-07-28 DIAGNOSIS — I4891 Unspecified atrial fibrillation: Secondary | ICD-10-CM | POA: Diagnosis not present

## 2015-07-28 DIAGNOSIS — I4819 Other persistent atrial fibrillation: Secondary | ICD-10-CM | POA: Diagnosis present

## 2015-07-28 DIAGNOSIS — R0609 Other forms of dyspnea: Secondary | ICD-10-CM

## 2015-07-28 DIAGNOSIS — R531 Weakness: Secondary | ICD-10-CM | POA: Diagnosis not present

## 2015-07-28 DIAGNOSIS — H268 Other specified cataract: Secondary | ICD-10-CM | POA: Diagnosis present

## 2015-07-28 DIAGNOSIS — G8929 Other chronic pain: Secondary | ICD-10-CM | POA: Diagnosis present

## 2015-07-28 DIAGNOSIS — R509 Fever, unspecified: Secondary | ICD-10-CM | POA: Diagnosis not present

## 2015-07-28 DIAGNOSIS — R040 Epistaxis: Secondary | ICD-10-CM | POA: Diagnosis present

## 2015-07-28 DIAGNOSIS — Z5189 Encounter for other specified aftercare: Secondary | ICD-10-CM | POA: Diagnosis not present

## 2015-07-28 DIAGNOSIS — F419 Anxiety disorder, unspecified: Secondary | ICD-10-CM | POA: Diagnosis present

## 2015-07-28 DIAGNOSIS — Z955 Presence of coronary angioplasty implant and graft: Secondary | ICD-10-CM

## 2015-07-28 DIAGNOSIS — R0602 Shortness of breath: Secondary | ICD-10-CM | POA: Diagnosis not present

## 2015-07-28 DIAGNOSIS — M419 Scoliosis, unspecified: Secondary | ICD-10-CM | POA: Diagnosis present

## 2015-07-28 DIAGNOSIS — M6281 Muscle weakness (generalized): Secondary | ICD-10-CM | POA: Diagnosis not present

## 2015-07-28 DIAGNOSIS — Z96651 Presence of right artificial knee joint: Secondary | ICD-10-CM | POA: Diagnosis present

## 2015-07-28 DIAGNOSIS — I480 Paroxysmal atrial fibrillation: Secondary | ICD-10-CM | POA: Diagnosis not present

## 2015-07-28 DIAGNOSIS — I2510 Atherosclerotic heart disease of native coronary artery without angina pectoris: Secondary | ICD-10-CM | POA: Diagnosis not present

## 2015-07-28 DIAGNOSIS — Z8546 Personal history of malignant neoplasm of prostate: Secondary | ICD-10-CM

## 2015-07-28 DIAGNOSIS — E785 Hyperlipidemia, unspecified: Secondary | ICD-10-CM | POA: Diagnosis present

## 2015-07-28 DIAGNOSIS — Z885 Allergy status to narcotic agent status: Secondary | ICD-10-CM | POA: Diagnosis not present

## 2015-07-28 DIAGNOSIS — Z7902 Long term (current) use of antithrombotics/antiplatelets: Secondary | ICD-10-CM | POA: Diagnosis not present

## 2015-07-28 DIAGNOSIS — I10 Essential (primary) hypertension: Secondary | ICD-10-CM | POA: Diagnosis not present

## 2015-07-28 DIAGNOSIS — M1712 Unilateral primary osteoarthritis, left knee: Secondary | ICD-10-CM | POA: Diagnosis present

## 2015-07-28 DIAGNOSIS — J189 Pneumonia, unspecified organism: Secondary | ICD-10-CM | POA: Insufficient documentation

## 2015-07-28 DIAGNOSIS — J9690 Respiratory failure, unspecified, unspecified whether with hypoxia or hypercapnia: Secondary | ICD-10-CM

## 2015-07-28 LAB — BASIC METABOLIC PANEL
ANION GAP: 14 (ref 5–15)
BUN: 14 mg/dL (ref 6–20)
CHLORIDE: 103 mmol/L (ref 101–111)
CO2: 23 mmol/L (ref 22–32)
Calcium: 9.2 mg/dL (ref 8.9–10.3)
Creatinine, Ser: 1.33 mg/dL — ABNORMAL HIGH (ref 0.61–1.24)
GFR calc Af Amer: 55 mL/min — ABNORMAL LOW (ref >60)
GFR, EST NON AFRICAN AMERICAN: 48 mL/min — AB (ref >60)
Glucose, Bld: 131 mg/dL — ABNORMAL HIGH (ref 65–99)
POTASSIUM: 4.4 mmol/L (ref 3.5–5.1)
SODIUM: 140 mmol/L (ref 135–145)

## 2015-07-28 LAB — MAGNESIUM
MAGNESIUM: 1.3 mg/dL — AB (ref 1.7–2.4)
Magnesium: 2.6 mg/dL — ABNORMAL HIGH (ref 1.7–2.4)

## 2015-07-28 MED ORDER — DOFETILIDE 250 MCG PO CAPS
500.0000 ug | ORAL_CAPSULE | Freq: Two times a day (BID) | ORAL | Status: DC
  Filled 2015-07-28: qty 2

## 2015-07-28 MED ORDER — MAGNESIUM SULFATE 4 GM/100ML IV SOLN
4.0000 g | Freq: Once | INTRAVENOUS | Status: AC
  Administered 2015-07-28: 4 g via INTRAVENOUS
  Filled 2015-07-28: qty 100

## 2015-07-28 MED ORDER — DOFETILIDE 500 MCG PO CAPS
500.0000 ug | ORAL_CAPSULE | Freq: Two times a day (BID) | ORAL | Status: DC
  Administered 2015-07-28 – 2015-08-04 (×14): 500 ug via ORAL
  Filled 2015-07-28 (×4): qty 1
  Filled 2015-07-28: qty 2
  Filled 2015-07-28 (×8): qty 1

## 2015-07-28 MED ORDER — POLYETHYLENE GLYCOL 3350 17 G PO PACK: 17.0000 g | PACK | Freq: Every day | ORAL | Status: DC | PRN

## 2015-07-28 MED ORDER — LOSARTAN POTASSIUM 50 MG PO TABS
100.0000 mg | ORAL_TABLET | Freq: Every day | ORAL | Status: DC
  Administered 2015-07-29 – 2015-08-04 (×7): 100 mg via ORAL
  Filled 2015-07-28 (×7): qty 2

## 2015-07-28 MED ORDER — MAGNESIUM HYDROXIDE 400 MG/5ML PO SUSP
30.0000 mL | Freq: Every day | ORAL | Status: DC | PRN
  Filled 2015-07-28: qty 30

## 2015-07-28 MED ORDER — DIAZEPAM 2 MG PO TABS: 2.0000 mg | ORAL_TABLET | Freq: Every day | ORAL | Status: DC | PRN

## 2015-07-28 MED ORDER — ACETAMINOPHEN 500 MG PO TABS
1000.0000 mg | ORAL_TABLET | Freq: Every day | ORAL | Status: DC | PRN
  Administered 2015-07-28 – 2015-07-29 (×2): 1000 mg via ORAL
  Filled 2015-07-28 (×2): qty 2

## 2015-07-28 MED ORDER — MIRABEGRON ER 25 MG PO TB24
25.0000 mg | ORAL_TABLET | Freq: Every day | ORAL | Status: DC
  Administered 2015-07-28 – 2015-08-03 (×7): 25 mg via ORAL
  Filled 2015-07-28 (×7): qty 1

## 2015-07-28 MED ORDER — NITROGLYCERIN 0.4 MG SL SUBL: 0.4000 mg | SUBLINGUAL_TABLET | SUBLINGUAL | Status: DC | PRN

## 2015-07-28 MED ORDER — PANTOPRAZOLE SODIUM 40 MG PO TBEC
40.0000 mg | DELAYED_RELEASE_TABLET | Freq: Every day | ORAL | Status: DC
  Administered 2015-07-29 – 2015-08-04 (×7): 40 mg via ORAL
  Filled 2015-07-28 (×7): qty 1

## 2015-07-28 MED ORDER — AMOXICILLIN 500 MG PO CAPS: 2000.0000 mg | ORAL_CAPSULE | ORAL | Status: DC

## 2015-07-28 MED ORDER — APIXABAN 5 MG PO TABS
5.0000 mg | ORAL_TABLET | Freq: Two times a day (BID) | ORAL | Status: DC
  Administered 2015-07-28 – 2015-08-04 (×14): 5 mg via ORAL
  Filled 2015-07-28 (×14): qty 1

## 2015-07-28 MED ORDER — SODIUM CHLORIDE 0.9% FLUSH
3.0000 mL | Freq: Two times a day (BID) | INTRAVENOUS | Status: DC
  Administered 2015-07-28 – 2015-08-02 (×10): 3 mL via INTRAVENOUS

## 2015-07-28 MED ORDER — POTASSIUM CHLORIDE ER 10 MEQ PO TBCR
20.0000 meq | EXTENDED_RELEASE_TABLET | Freq: Every day | ORAL | Status: DC
  Administered 2015-07-29 – 2015-08-04 (×7): 20 meq via ORAL
  Filled 2015-07-28 (×14): qty 2

## 2015-07-28 MED ORDER — VITAMIN D 1000 UNITS PO TABS
1000.0000 [IU] | ORAL_TABLET | Freq: Every day | ORAL | Status: DC
  Administered 2015-07-28 – 2015-08-03 (×7): 1000 [IU] via ORAL
  Filled 2015-07-28 (×7): qty 1

## 2015-07-28 MED ORDER — DILTIAZEM HCL ER COATED BEADS 240 MG PO CP24
240.0000 mg | ORAL_CAPSULE | Freq: Every day | ORAL | Status: DC
  Administered 2015-07-29 – 2015-08-04 (×7): 240 mg via ORAL
  Filled 2015-07-28 (×7): qty 1

## 2015-07-28 MED ORDER — SODIUM CHLORIDE 0.9% FLUSH: 3.0000 mL | INTRAVENOUS | Status: DC | PRN

## 2015-07-28 MED ORDER — HYDROMORPHONE HCL 2 MG PO TABS: 2.0000 mg | ORAL_TABLET | ORAL | Status: DC | PRN

## 2015-07-28 MED ORDER — SODIUM CHLORIDE 0.9 % IV SOLN: 250.0000 mL | INTRAVENOUS | Status: DC | PRN

## 2015-07-28 MED ORDER — TAMSULOSIN HCL 0.4 MG PO CAPS
0.4000 mg | ORAL_CAPSULE | Freq: Every day | ORAL | Status: DC
  Administered 2015-07-28 – 2015-08-03 (×7): 0.4 mg via ORAL
  Filled 2015-07-28 (×7): qty 1

## 2015-07-28 MED ORDER — METOPROLOL SUCCINATE ER 25 MG PO TB24
25.0000 mg | ORAL_TABLET | Freq: Every day | ORAL | Status: DC
  Administered 2015-07-29 – 2015-08-04 (×7): 25 mg via ORAL
  Filled 2015-07-28 (×7): qty 1

## 2015-07-28 MED ORDER — POLYETHYLENE GLYCOL 3350 17 GM/SCOOP PO POWD: 1.0000 | Freq: Every day | ORAL | Status: DC | PRN

## 2015-07-28 MED ORDER — PSYLLIUM 95 % PO PACK
1.0000 | PACK | Freq: Every day | ORAL | Status: DC
  Administered 2015-07-30 – 2015-08-04 (×5): 1 via ORAL
  Filled 2015-07-28 (×6): qty 1

## 2015-07-28 MED ORDER — ATORVASTATIN CALCIUM 40 MG PO TABS
40.0000 mg | ORAL_TABLET | Freq: Every day | ORAL | Status: DC
  Administered 2015-07-28 – 2015-08-03 (×7): 40 mg via ORAL
  Filled 2015-07-28 (×7): qty 1

## 2015-07-28 NOTE — H&P (Signed)
ELECTROPHYSIOLOGY HISTORY AND PHYSICAL    Patient ID: Jerry [name] MRN: [id number], DOB/AGE: [birth year] 80 y.o.  Admit date: 07/28/2015 Date of Consult: 07/28/2015  Primary Physician: [name], MD Primary Cardiologist: Ellyn Hack  CC: here for Tikosyn loading  HPI:  Jerry [name] is a 82 y.o. male with a past medical history significant for hypertension, hyperlipidemia, CAD, prostate cancer s/p seed implant, questionable OSA.  He was first diagnosed with atrial fibrillation in 06/2015. He underwent TEE/DCCV and had ERAF.  He is symptomatic with atrial fibrillation with shortness of breath. OSA is listed in his problem list but he has not been formally diagnosed. He does drink alcohol nightly. He has been placed on Eliquis and is tolerating this with some nosebleeds.  AAD therapy was discussed with the patient by Roderic Palau, NP. With CAD, Flecainide is not an option. Tikosyn and Amiodarone were offered and the patient would like to try Tikosyn.  Lab work is remarkable for Mg of 1.3, creat of 1.33, K of 4.4.  Echo 06/2015 demonstrated EF 60-65%, no RWMA, PA pressure 32, LA 43.   He currently denies chest pain, shortness of breath, orthopnea, PND, recent fevers, chills, nausea or vomiting.   Past Medical History  Diagnosis Date  . Hypertension     Labile -- takes Verapamil,Losartan,and Metoprolol daily   . Peripheral edema     takes HCTZ daily  . Hyperlipidemia     takes Lipitor nightly  . CAD S/P percutaneous coronary angioplasty 1996, 2009, 01/2010    PTCA of L Cx 1996; BMS-RCA [facility]) 2009; Staged PCI RCA (70% ISR) --> 3.0 mm x 23 mm BMS, staged PCI Diag - 2.0 mm x 12 mm Mini Vision BMS (2.25 mm)  . Myocardial infarction [facility]) 1996, 2009    x 2;last time was about 8-41yrs ago   . Osteoarthritis of both knees   . Chronic back pain     scoliosis--pt states unable to have surgery bc of age  . GERD (gastroesophageal reflux disease)     takes Nexium daily  . History of  colon polyps   . Urinary frequency     takes Vesicare and Flomax daily  . Urinary urgency   . Prostate cancer ([place]) 2005    seed implant  . Cataracts, bilateral     immature  . Anxiety     takes Valium prn  . Bleeding nose     occasionally  . Obesity (BMI 30.0-34.9)     Last BMI ~31;   . OSA (obstructive sleep apnea)   . Heart palpitations     PACs, short bursts of PSVT     Surgical History:  Past Surgical History  Procedure Laterality Date  . Right knee arthrsoscopy  72yrs ago  . Cyst removed  in college  . Coronary angioplasty with stent placement  1996, 2009, August 2011    PTCA of L Cx 1996; BMS-RCA [facility]) 2009; Staged PCI RCA (70% ISR) --> 3.0 mm x 23 mm BMS, staged PCI Diag - 2.0 mm x 12 mm Mini Vision BMS (2.25 mm)  . Wisdom teeth extraccted    . Colonoscopy    . Seeds placed into prostate   2005  . Cholecystectomy    . Neuroplasty / transposition median nerve at carpal tunnel bilateral    . Right trigger finger    . Total knee arthroplasty  05/09/2012    Procedure: TOTAL KNEE ARTHROPLASTY;  Surgeon: Kerin Salen, MD;  Location: [place];  Service: Orthopedics;  Laterality: Right;  . Transthoracic echocardiogram  09/26/2012    EF 55-60%. Grade 2 diastolic dysfunction with moderate concentric LVH. Aortic sclerosis.  [name] Nm myoview ltd  November 2013    EF 53%; fixed mid inferior-inferolateral defect, infarct with no ischemia.  [name] Nm myoview ltd  11/24/2010    ejection fraction 58%,left ventricle is normal size ,no evidence of inducible myocardial ischemia  . Cardioversion N/A 06/12/2015    Procedure: CARDIOVERSION;  Surgeon: Sanda Klein, MD;  [facility];  Service: Cardiovascular;  Laterality: N/A;  . Tee without cardioversion N/A 06/12/2015    Procedure: TRANSESOPHAGEAL ECHOCARDIOGRAM (TEE);  Surgeon: Sanda Klein, MD;  Location: [facility] ENDOSCOPY;  Service: Cardiovascular;  Laterality: N/A;     Prescriptions prior to admission  Medication Sig Dispense Refill  Last Dose  . acetaminophen (TYLENOL) 500 MG tablet Take 1,000 mg by mouth daily as needed (pain).   Taking  . amoxicillin (AMOXIL) 500 MG capsule Take 2,000 mg by mouth See admin instructions. Take 4 capsules (2000 mg) by mouth one hour before dental appointment   Taking  . apixaban (ELIQUIS) 5 MG TABS tablet Take 1 tablet (5 mg total) by mouth 2 (two) times daily. 90 tablet 1 Taking  . atorvastatin (LIPITOR) 40 MG tablet Take 40 mg by mouth at bedtime.    Taking  . cholecalciferol (VITAMIN D) 1000 UNITS tablet Take 1,000 Units by mouth at bedtime.    Taking  . diazepam (VALIUM) 2 MG tablet Take 2 mg by mouth daily as needed for anxiety.    Taking  . diltiazem (CARDIZEM CD) 240 MG 24 hr capsule Take 1 capsule (240 mg total) by mouth daily. 30 capsule 3   . esomeprazole (NEXIUM) 40 MG capsule Take 1 capsule (40 mg total) by mouth daily. 90 capsule 3 Taking  . hydrocortisone cream 1 % Apply 1 application topically at bedtime.   Taking  . HYDROmorphone (DILAUDID) 2 MG tablet Take 2 mg by mouth every 4 (four) hours as needed for pain.   Taking  . losartan (COZAAR) 100 MG tablet Take 1 tablet (100 mg total) by mouth daily. 90 tablet 1 Taking  . Magnesium 200 MG TABS Take 1 tablet (200 mg total) by mouth 2 (two) times daily. 60 each    . magnesium hydroxide (MILK OF MAGNESIA) 400 MG/5ML suspension Take 30 mLs by mouth daily as needed for mild constipation.   Taking  . metoprolol succinate (TOPROL-XL) 25 MG 24 hr tablet Take 1 tablet (25 mg total) by mouth daily. 90 tablet 3 Taking  . mirabegron ER (MYRBETRIQ) 25 MG TB24 tablet Take 25 mg by mouth at bedtime.    Taking  . nitroGLYCERIN (NITROSTAT) 0.4 MG SL tablet Place 0.4 mg under the tongue every 5 (five) minutes as needed for chest pain.    Taking  . Polyethylene Glycol 3350 (MIRALAX PO) Take 17 g by mouth daily as needed (Constipation).    Taking  . potassium chloride 20 MEQ TBCR Take 20 mEq by mouth daily. 30 tablet 3   . Psyllium (METAMUCIL PO)  Take 15 mLs by mouth daily. Mix in 8 oz liquid and drink   Taking  . tamsulosin (FLOMAX) 0.4 MG CAPS capsule Take 1 capsule by mouth at bedtime.   Taking    Allergies:  Allergies  Allergen Reactions  . Crab [Shellfish Allergy] Rash  . Hydrocodone Hives  . Adhesive [Tape] Rash  . Oxycodone Rash    Social History  Social History  . Marital Status: Married    Spouse Name: N/A  . Number of Children: N/A  . Years of Education: N/A   Occupational History  . Retired    Social History Main Topics  . Smoking status: Former Smoker    Types: Pipe    Quit date: 03/12/1993  . Smokeless tobacco: Not on file     Comment: quit smoking pipe about 72yrs ago  . Alcohol Use: Yes     Comment: glass of wine daily  . Drug Use: No  . Sexual Activity: Not Currently   Other Topics Concern  . Not on file   Social History Narrative   He is a married father of 2, grandfather of 2. He exercises at least 3 to 4 times a week but does something 6 to 7 days a week. He does not smoke. Drinks occasional alcohol.      Family History: no premature CAD  Review of Systems: All other systems reviewed and are otherwise negative except as noted above.  Physical Exam: Filed Vitals:   07/28/15 1732  BP: 144/71  Pulse: 86  Temp: 99.9 F (37.7 C)  TempSrc: Oral  Resp: 20  Weight: 218 lb 8 oz (99.111 kg)  SpO2: 95%    GEN- The patient is elderly appearing, alert and oriented x 3 today.   HEENT: normocephalic, atraumatic; sclera clear, conjunctiva pink; hearing intact; oropharynx clear; neck supple Lungs- Clear to ausculation bilaterally, normal work of breathing.  No wheezes, rales, rhonchi Heart- Irregular rate and rhythm, no murmurs, rubs or gallops  GI- soft, non-tender, non-distended, bowel sounds present  Extremities- no clubbing, cyanosis, or edema  MS- no significant deformity or atrophy Skin- warm and dry, no rash or lesion Psych- euthymic mood, full affect Neuro- strength and sensation  are intact  Labs:   Lab Results  Component Value Date   WBC 8.3 06/09/2015   HGB 10.1* 06/09/2015   HCT 30.5* 06/09/2015   MCV 77.6* 06/09/2015   PLT 300 06/09/2015     Recent Labs Lab 07/28/15 1157  NA 140  K 4.4  CL 103  CO2 23  BUN 14  CREATININE 1.33*  CALCIUM 9.2  GLUCOSE 131*    TELEMETRY:  Atrial fibrillation   Assessment/Plan: 1.  Persistent atrial fibrillation The patient has symptomatic persistent atrial fibrillation and has failed DCCV off of AAD therapy. He presents today for Tikosyn initiation. Will need to supplement Magnesium  QTc ok per Dr Caryl Comes review in office today K ok, creat has been historically around 1, 1.33 today - will start Tikosyn 551mcg twice daily and follow renal function this admission.    Continue Eliquis for CHADS2VASC of 4 If remains in AF after 3 doses of Tikosyn, will need DCCV  2.  ?OSA Would recommend formal testing  3.  HTN Stable No change required today  4.  CAD No recent ischemic symptoms   5.  Hypomagnesia Will give 4grams of IV Mag now Repeat Mg at 10PM If Mg >1.8 at repeat lab, can start Tikosyn 542mcg tonight   Signed, Chanetta Marshall, NP 07/28/2015 5:41 PM   I have seen, examined the patient, and reviewed the above assessment and plan.  On exam, he is somewhat frail.  IRRR.  Changes to above are made where necessary.  Will need to replete Mg before initiation of tikosyn.  Follow QTc closely.  Reports compliance with eliquis.  Co Sign: [name], MD 07/28/2015 6:11 PM

## 2015-07-28 NOTE — Progress Notes (Signed)
Patient ID: Jerry [name] DOB: 04-22-1930, 82 yo MRN: [id number]     HPI: Jerry [name] is a 82 y.o. male patient of [name], also seen by Roderic Palau in [facility], who presents today with his wife for Tikosyn initiation. PMH is significant for MI s/p PCI and PTXA, PACs, HTN, and HLD. Pt was seen in afib clinic 07/16/15 for further treatment of symptomatic afib - new onset 06/09/15, TEE/DCCV performed 1/6 with ERAF within a week. He has been very symptomatic with dyspnea and has recently been using a wheelchair because he feels weak. Due to decreased QOL, it was decided at last visit with Butch Penny to start patient on Tikosyn.  Patient presents today to clinic with his wife. We reviewed potential side effects of Tikosyn including QTc prolongation.He is aware of the importance of not missing doses and will call the office if he misses more than 2 doses in a row. He is aware to inform all of his other providers about Tikosyn and to call the office if there is ever a question about taking a new medication. Reviewed patient's medication list - he is not on any QTc prolonging drugs. He appropriately stopped HCTZ, Benadryl and switched from verapamil to diltiazem on 07/16/15. He was also supplemented with K and Mg d/t low electrolytes - patient reports that he has been taking Mg once a day instead of BID as prescribed. He is anticoagulated on Eliquis and reports that he has not missed a dose in the last 30 days.  EKG reviewed by Dr. Caryl Comes. Atrial fibrillation with RVR and vent rate 105 bpm, QTc 42msec.  Labs: K 4.4, Mg low at 1.3, SCr 1.33, Wt 216 lbs, CrCl 58 mL/min  Past Medical History  Diagnosis Date  . Hypertension     Labile -- takes Verapamil,Losartan,and Metoprolol daily   . Peripheral edema     takes HCTZ daily  . Hyperlipidemia     takes Lipitor nightly  . CAD S/P percutaneous coronary angioplasty 1996, 2009, 01/2010    PTCA of L Cx 1996;  BMS-RCA [facility]) 2009; Staged PCI RCA (70% ISR) --> 3.0 mm x 23 mm BMS, staged PCI Diag - 2.0 mm x 12 mm Mini Vision BMS (2.25 mm)  . Myocardial infarction [facility]) 1996, 2009    x 2;last time was about 8-74yrs ago   . Osteoarthritis of both knees   . Chronic back pain     scoliosis--pt states unable to have surgery bc of age  . GERD (gastroesophageal reflux disease)     takes Nexium daily  . History of colon polyps   . Urinary frequency     takes Vesicare and Flomax daily  . Urinary urgency   . Prostate cancer ([place]) 2005    seed implant  . Cataracts, bilateral     immature  . Anxiety     takes Valium prn  . Bleeding nose     occasionally  . Obesity (BMI 30.0-34.9)     Last BMI ~31;   . OSA (obstructive sleep apnea)   . Heart palpitations     PACs, short bursts of PSVT   Current Outpatient Prescriptions on File Prior to Visit  Medication Sig Dispense Refill  . acetaminophen (TYLENOL) 500 MG tablet Take 1,000 mg by mouth daily as needed (pain).    [name] amoxicillin (AMOXIL) 500 MG capsule Take 2,000 mg by mouth See admin instructions. Take 4 capsules (2000 mg) by mouth one hour before dental appointment    .  apixaban (ELIQUIS) 5 MG TABS tablet Take 1 tablet (5 mg total) by mouth 2 (two) times daily. 90 tablet 1  . atorvastatin (LIPITOR) 40 MG tablet Take 40 mg by mouth at bedtime.     . cholecalciferol (VITAMIN D) 1000 UNITS tablet Take 1,000 Units by mouth at bedtime.     . diazepam (VALIUM) 2 MG tablet Take 2 mg by mouth daily as needed for anxiety.     [name] diltiazem (CARDIZEM CD) 240 MG 24 hr capsule Take 1 capsule (240 mg total) by mouth daily. 30 capsule 3  . esomeprazole (NEXIUM) 40 MG capsule Take 1 capsule (40 mg total) by mouth daily. 90 capsule 3  . hydrocortisone cream 1 % Apply 1 application topically at bedtime.    [name] HYDROmorphone (DILAUDID) 2 MG tablet Take 2 mg by mouth every 4 (four) hours as needed for pain.    [name] losartan (COZAAR) 100 MG tablet Take 1 tablet (100 mg total)  by mouth daily. 90 tablet 1  . Magnesium 200 MG TABS Take 1 tablet (200 mg total) by mouth 2 (two) times daily. 60 each   . magnesium hydroxide (MILK OF MAGNESIA) 400 MG/5ML suspension Take 30 mLs by mouth daily as needed for mild constipation.    . metoprolol succinate (TOPROL-XL) 25 MG 24 hr tablet Take 1 tablet (25 mg total) by mouth daily. 90 tablet 3  . mirabegron ER (MYRBETRIQ) 25 MG TB24 tablet Take 25 mg by mouth at bedtime.     . nitroGLYCERIN (NITROSTAT) 0.4 MG SL tablet Place 0.4 mg under the tongue every 5 (five) minutes as needed for chest pain.     . Polyethylene Glycol 3350 (MIRALAX PO) Take 17 g by mouth daily as needed (Constipation).     . potassium chloride 20 MEQ TBCR Take 20 mEq by mouth daily. 30 tablet 3  . Psyllium (METAMUCIL PO) Take 15 mLs by mouth daily. Mix in 8 oz liquid and drink    . tamsulosin (FLOMAX) 0.4 MG CAPS capsule Take 1 capsule by mouth at bedtime.     No current facility-administered medications on file prior to visit.   Allergies  Allergen Reactions  . Crab [Shellfish Allergy] Rash  . Hydrocodone Hives  . Adhesive [Tape] Rash  . Oxycodone Rash    Assessment/Plan:  1. Atrial fibrillation - EKG reviewed by Dr. Caryl Comes and acceptable to start Tikosyn with baseline QTc <477msec. K normal, Mg still low despite supplementation with 200mg  BID (pt reported only taking it once daily) over the last week and a half. Will need IV supplementation. Pt aware to report to admitting and that Tikosyn may not start until tomorrow due to low Mg.   Jerry [name] E. Caiden Arteaga, PharmD, [place] [id number] [place], [place] [address] Phone: [phone]; Fax: [phone] 07/28/2015 1:53 PM

## 2015-07-28 NOTE — Progress Notes (Signed)
Page Dr, Claiborne Billings to inform him of fever 101.7 awaiting return call

## 2015-07-29 ENCOUNTER — Encounter (HOSPITAL_COMMUNITY): Payer: Self-pay | Admitting: *Deleted

## 2015-07-29 ENCOUNTER — Inpatient Hospital Stay (HOSPITAL_COMMUNITY): Payer: Medicare Other

## 2015-07-29 ENCOUNTER — Other Ambulatory Visit: Payer: Self-pay

## 2015-07-29 DIAGNOSIS — R509 Fever, unspecified: Secondary | ICD-10-CM | POA: Insufficient documentation

## 2015-07-29 DIAGNOSIS — R0609 Other forms of dyspnea: Secondary | ICD-10-CM

## 2015-07-29 DIAGNOSIS — I481 Persistent atrial fibrillation: Principal | ICD-10-CM

## 2015-07-29 DIAGNOSIS — R0602 Shortness of breath: Secondary | ICD-10-CM

## 2015-07-29 DIAGNOSIS — J9600 Acute respiratory failure, unspecified whether with hypoxia or hypercapnia: Secondary | ICD-10-CM | POA: Insufficient documentation

## 2015-07-29 LAB — BASIC METABOLIC PANEL
ANION GAP: 8 (ref 5–15)
BUN: 14 mg/dL (ref 6–20)
CHLORIDE: 105 mmol/L (ref 101–111)
CO2: 23 mmol/L (ref 22–32)
Calcium: 8.7 mg/dL — ABNORMAL LOW (ref 8.9–10.3)
Creatinine, Ser: 1.04 mg/dL (ref 0.61–1.24)
GFR calc Af Amer: >60 mL/min (ref >60)
GFR calc non Af Amer: >60 mL/min (ref >60)
GLUCOSE: 126 mg/dL — AB (ref 65–99)
POTASSIUM: 4.2 mmol/L (ref 3.5–5.1)
Sodium: 136 mmol/L (ref 135–145)

## 2015-07-29 LAB — BLOOD GAS, ARTERIAL
Acid-Base Excess: 0.4 mmol/L (ref 0.0–2.0)
BICARBONATE: 23.2 meq/L (ref 20.0–24.0)
DRAWN BY: 27022
Delivery systems: POSITIVE
Expiratory PAP: 6
FIO2: 100
Inspiratory PAP: 12
O2 SAT: 100 %
PATIENT TEMPERATURE: 101.2
PO2 ART: 382 mmHg — AB (ref 80.0–100.0)
TCO2: 24 mmol/L (ref 0–100)
pCO2 arterial: 31.1 mmHg — ABNORMAL LOW (ref 35.0–45.0)
pH, Arterial: 7.492 — ABNORMAL HIGH (ref 7.350–7.450)

## 2015-07-29 LAB — CBC
HEMATOCRIT: 31.5 % — AB (ref 39.0–52.0)
HEMOGLOBIN: 9.8 g/dL — AB (ref 13.0–17.0)
MCH: 25.2 pg — AB (ref 26.0–34.0)
MCHC: 31.1 g/dL (ref 30.0–36.0)
MCV: 81 fL (ref 78.0–100.0)
Platelets: 186 10*3/uL (ref 150–400)
RBC: 3.89 MIL/uL — ABNORMAL LOW (ref 4.22–5.81)
RDW: 18.2 % — AB (ref 11.5–15.5)
WBC: 8.5 10*3/uL (ref 4.0–10.5)

## 2015-07-29 LAB — MRSA PCR SCREENING: MRSA by PCR: NEGATIVE

## 2015-07-29 LAB — INFLUENZA PANEL BY PCR (TYPE A & B)
H1N1 flu by pcr: NOT DETECTED
INFLBPCR: NEGATIVE
Influenza A By PCR: NEGATIVE

## 2015-07-29 LAB — LACTIC ACID, PLASMA
Lactic Acid, Venous: 1.4 mmol/L (ref 0.5–2.0)
Lactic Acid, Venous: 1.2 mmol/L (ref 0.5–2.0)

## 2015-07-29 LAB — PROCALCITONIN: Procalcitonin: 0.1 ng/mL

## 2015-07-29 LAB — MAGNESIUM: MAGNESIUM: 2.2 mg/dL (ref 1.7–2.4)

## 2015-07-29 MED ORDER — VANCOMYCIN HCL 10 G IV SOLR
2000.0000 mg | Freq: Once | INTRAVENOUS | Status: AC
  Administered 2015-07-29: 2000 mg via INTRAVENOUS
  Filled 2015-07-29 (×3): qty 2000

## 2015-07-29 MED ORDER — DEXTROSE 5 % IV SOLN
2.0000 g | INTRAVENOUS | Status: DC
  Administered 2015-07-29 – 2015-08-03 (×6): 2 g via INTRAVENOUS
  Filled 2015-07-29 (×8): qty 2

## 2015-07-29 MED ORDER — IPRATROPIUM BROMIDE 0.02 % IN SOLN
RESPIRATORY_TRACT | Status: AC
  Administered 2015-07-29: 0.5 mg
  Filled 2015-07-29: qty 2.5

## 2015-07-29 MED ORDER — VANCOMYCIN HCL 10 G IV SOLR: 1250.0000 mg | INTRAVENOUS | Status: DC

## 2015-07-29 MED ORDER — VANCOMYCIN HCL 10 G IV SOLR
1250.0000 mg | INTRAVENOUS | Status: DC
  Administered 2015-07-30 – 2015-07-31 (×2): 1250 mg via INTRAVENOUS
  Filled 2015-07-29 (×3): qty 1250

## 2015-07-29 MED ORDER — BUDESONIDE 0.5 MG/2ML IN SUSP
0.5000 mg | Freq: Two times a day (BID) | RESPIRATORY_TRACT | Status: DC
  Administered 2015-07-29 – 2015-08-03 (×10): 0.5 mg via RESPIRATORY_TRACT
  Filled 2015-07-29 (×10): qty 2

## 2015-07-29 MED ORDER — FUROSEMIDE 10 MG/ML IJ SOLN
40.0000 mg | Freq: Once | INTRAMUSCULAR | Status: AC
  Administered 2015-07-29: 40 mg via INTRAVENOUS
  Filled 2015-07-29: qty 4

## 2015-07-29 MED ORDER — LEVALBUTEROL HCL 0.63 MG/3ML IN NEBU
0.6300 mg | INHALATION_SOLUTION | Freq: Four times a day (QID) | RESPIRATORY_TRACT | Status: DC | PRN
  Administered 2015-07-30: 0.63 mg via RESPIRATORY_TRACT
  Filled 2015-07-29: qty 3

## 2015-07-29 MED ORDER — METHYLPREDNISOLONE SODIUM SUCC 40 MG IJ SOLR
40.0000 mg | Freq: Four times a day (QID) | INTRAMUSCULAR | Status: DC
  Administered 2015-07-29 – 2015-07-30 (×4): 40 mg via INTRAVENOUS
  Filled 2015-07-29 (×5): qty 1

## 2015-07-29 MED ORDER — IPRATROPIUM BROMIDE 0.02 % IN SOLN
0.5000 mg | RESPIRATORY_TRACT | Status: DC
  Administered 2015-07-29 – 2015-07-30 (×5): 0.5 mg via RESPIRATORY_TRACT
  Filled 2015-07-29 (×5): qty 2.5

## 2015-07-29 NOTE — Progress Notes (Signed)
Patient coughed up a quarter size amount of red blood. The PA has been notified. Will continue to monitor.

## 2015-07-29 NOTE — Progress Notes (Signed)
Pharmacy Antibiotic Note  Jerry [name] is a 82 y.o. male admitted on 07/28/2015 with pneumonia.  Pharmacy has been consulted for vancomycin dosing. Patient also on CTX per MD. Tmax/24h 101.7, wbc wnl. SCr 1.33>>1.04 (~at baseline), normalized CrCl~54.  Plan: Vanc 2g IV x 1; then 1250mg  IV q24h CTX 2g IV q24h per MD Monitor clinical progress, c/s, renal function, abx plan/LOT VT@SS  as indicated No QTc prolonging abx on Tikosyn  Height: 5\' 10"  (177.8 cm) Weight: 218 lb 8 oz (99.111 kg) IBW/kg (Calculated) : 73  Temp (24hrs), Avg:100 F (37.8 C), Min:97.7 F (36.5 C), Max:101.7 F (38.7 C)   Recent Labs Lab 07/28/15 1157 07/29/15 0400 07/29/15 0817  WBC  --   --  8.5  CREATININE 1.33* 1.04  --     Estimated Creatinine Clearance: 63.5 mL/min (by C-G formula based on Cr of 1.04).    Allergies  Allergen Reactions  . Crab [Shellfish Allergy] Rash  . Hydrocodone Hives  . Adhesive [Tape] Rash  . Oxycodone Rash    Antimicrobials this admission: 2/22 vanc>> 2/22 ctx>>  Dose adjustments this admission: n/a  Microbiology results: 2/22 BCx2>> 2/22 MRSA pcr>> 2/22 resp cx>> 2/22 UC>>  Elicia Lamp, PharmD, BCPS Clinical Pharmacist Pager [phone] 07/29/2015 11:18 AM

## 2015-07-29 NOTE — Significant Event (Signed)
Rapid Response Event Note  Overview:  Called to assist with patient needing BiPAP Time Called: 1021 Arrival Time: [id number] Event Type: Respiratory  Initial Focused Assessment: On arrival patient alert hot to touch little diaphoretic - speaking full sentences - bil BS with wheezing and some basilar rales mild - worse on right.  Dr. Corrie Dandy present.  BP 169/99 HR afib 145 - patient here with afib dx and Tikosyn load.  Denies CP.  RR 32-28 - shallow and rapid with accessory muscle use.  Abd large slightly firm.  Temp 101.2 - oral - will get rectal temp after stablizing resps status.     Interventions: Stat call for Bipap -  RT here - placed on Bipap per order - Atrovent given thru Bipap per order - see RT note - patient tol fair.  Relaxing accessory muscle use.  Lasix IV given per order.  ABG drawn.  Solumedrol 40 mg IV given per order.  Patient more comfortable.  Some urinary urgency with incontinence before he can get urinal in place - suggested condom cath - patient wants to think about it.  Voiding clear yellow urine.  Wife here - update given by Dr. Cathlean Sauer - support given.  152/86 HR 121 RR 24 on Bipap.  Labs drawn.  Transferred to [id number] progressive care step down status.  Resting and stable.  Handoff to [employer].     Event Summary: Name of Physician Notified: Dr. Rayann Heman  at  (PTA RRT)  Name of Consulting Physician Notified: Dr. Louanne Skye at  (pta RRT by Dr. Rayann Heman)  Outcome: Transferred (Comment)     Quin Hoop

## 2015-07-29 NOTE — Progress Notes (Signed)
Patient went down to X-ray. Upon return, his breathing was more labored, O2 was at 92% on 2 liters and heart rate was 145. Pulmonary was in the room and advised to call rapid response. Respiratory placed the patient on Bi-pap and paitient was moved to 3w. Report was given to [place]. Please see Rapid's note for more details.

## 2015-07-29 NOTE — Progress Notes (Signed)
Pt pulled off BIBAP, pt placed on 6L nasal cannula sating 95%, RR 24. Respiratory in the room. States they will come by and check on pt in a little bit. Pt has no complaints. Wishes to remain on [place].  MD paged made aware

## 2015-07-29 NOTE — H&P (Signed)
Name: Jerry [name] MRN: [id number] DOB: [birth year]    ADMISSION DATE:  07/28/2015 CONSULTATION DATE:  07/29/15  REFERRING MD :  Dr. Delrae Alfred / Cards EP  CHIEF COMPLAINT:  Pt admitted for Tikosyn loading  BRIEF PATIENT DESCRIPTION:  Jerry [name] is a 82 y.o. male with a past medical history significant for hypertension, hyperlipidemia, CAD, prostate cancer s/p seed implant, questionable OSA. He was first diagnosed with atrial fibrillation in 06/2015. He underwent TEE/DCCV and had ERAF. He is symptomatic with atrial fibrillation with shortness of breath. Pt was admitted on 2/21 for Tikosyn loading. Pt with fever to 101F on night of admission. Fever persisted. Had resp distress, wheezing on 2/22. CXR with B infiltrates. PCCM consulted.    SIGNIFICANT EVENTS  2/21 admitted for Tikosyn loading 2/22 pt with resp distress, wheeze, fever. CXR with multifocal infiltrates. PCCM consulted.   STUDIES:  CXR (2/21) Multifocal infiltrates. PNA considered.    HISTORY OF PRESENT ILLNESS:   Jerry [name] is a 82 y.o. male with a past medical history significant for hypertension, hyperlipidemia, CAD, prostate cancer s/p seed implant, questionable OSA. He was first diagnosed with atrial fibrillation in 06/2015. He underwent TEE/DCCV and had ERAF. He is symptomatic with atrial fibrillation with shortness of breath. OSA is listed in his problem list but he has not been formally diagnosed. He does drink alcohol nightly. He has been placed on Eliquis and is tolerating this with some nosebleeds. AAD therapy was discussed with the patient by Roderic Palau, NP. With CAD, Flecainide is not an option. Tikosyn and Amiodarone were offered and the patient would like to try Tikosyn. Lab work is remarkable for Mg of 1.3, creat of 1.33, K of 4.4. Echo 06/2015 demonstrated EF 60-65%, no RWMA, PA pressure 32, LA 43. Pt was  Admitted for Tikosyn loading.    PAST MEDICAL HISTORY :   has a past medical  history of Hypertension; Peripheral edema; Hyperlipidemia; CAD S/P percutaneous coronary angioplasty (1996, 2009, 01/2010); Myocardial infarction [facility]) (1996, 2009); Osteoarthritis of both knees; Chronic back pain; GERD (gastroesophageal reflux disease); History of colon polyps; Urinary frequency; Urinary urgency; Prostate cancer ([place]) (2005); Cataracts, bilateral; Anxiety; Bleeding nose; Obesity (BMI 30.0-34.9); OSA (obstructive sleep apnea); and Heart palpitations.  has past surgical history that includes right knee arthrsoscopy (18yrs ago); cyst removed (in college); Coronary angioplasty with stent (1996, 2009, August 2011); wisdom teeth extraccted; Colonoscopy; seeds placed into prostate  (2005); Cholecystectomy; Neuroplasty / transposition median nerve at carpal tunnel bilateral; right trigger finger; Total knee arthroplasty (05/09/2012); transthoracic echocardiogram (09/26/2012); NM MYOVIEW LTD (November 2013); NM MYOVIEW LTD (11/24/2010); Cardioversion (N/A, 06/12/2015); and TEE without cardioversion (N/A, 06/12/2015).   Pt deneis anyh/o of asthma, copd, allergies. (-) DVT, CA, previous infxn.   Prior to Admission medications   Medication Sig Start Date End Date Taking? Authorizing Provider  acetaminophen (TYLENOL) 500 MG tablet Take 1,000 mg by mouth daily as needed (pain).   Yes Historical Provider, MD  amoxicillin (AMOXIL) 500 MG capsule Take 2,000 mg by mouth See admin instructions. Take 4 capsules (2000 mg) by mouth one hour before dental appointment   Yes Historical Provider, MD  apixaban (ELIQUIS) 5 MG TABS tablet Take 1 tablet (5 mg total) by mouth 2 (two) times daily. 06/25/15  Yes Rhonda G Barrett, PA-C  atorvastatin (LIPITOR) 40 MG tablet Take 40 mg by mouth at bedtime.    Yes Historical Provider, MD  cholecalciferol (VITAMIN D) 1000 UNITS tablet Take 1,000 Units by mouth at  bedtime.    Yes Historical Provider, MD  diazepam (VALIUM) 2 MG tablet Take 2 mg by mouth daily as needed for anxiety.     Yes Historical Provider, MD  diltiazem (CARDIZEM CD) 240 MG 24 hr capsule Take 1 capsule (240 mg total) by mouth daily. 07/16/15  Yes Sherran Needs, NP  esomeprazole (NEXIUM) 40 MG capsule Take 1 capsule (40 mg total) by mouth daily. 10/06/14  Yes Leonie Man, MD  hydrocortisone cream 1 % Apply 1 application topically daily as needed for itching.    Yes Historical Provider, MD  losartan (COZAAR) 100 MG tablet Take 1 tablet (100 mg total) by mouth daily. 03/09/15  Yes Leonie Man, MD  Magnesium 200 MG TABS Take 1 tablet (200 mg total) by mouth 2 (two) times daily. Patient taking differently: Take 200 mg by mouth daily at 12 noon.  07/16/15  Yes Sherran Needs, NP  magnesium hydroxide (MILK OF MAGNESIA) 400 MG/5ML suspension Take 30 mLs by mouth daily as needed for mild constipation.   Yes Historical Provider, MD  metoprolol succinate (TOPROL-XL) 25 MG 24 hr tablet Take 1 tablet (25 mg total) by mouth daily. 08/19/14  Yes Leonie Man, MD  mirabegron ER (MYRBETRIQ) 25 MG TB24 tablet Take 25 mg by mouth daily.    Yes Historical Provider, MD  nitroGLYCERIN (NITROSTAT) 0.4 MG SL tablet Place 0.4 mg under the tongue every 5 (five) minutes as needed for chest pain.    Yes Historical Provider, MD  Polyethylene Glycol 3350 (MIRALAX PO) Take 17 g by mouth daily as needed (Constipation).    Yes Historical Provider, MD  potassium chloride 20 MEQ TBCR Take 20 mEq by mouth daily. 07/16/15  Yes Sherran Needs, NP  tamsulosin (FLOMAX) 0.4 MG CAPS capsule Take 0.4 mg by mouth at bedtime.  06/24/15  Yes Historical Provider, MD   Allergies  Allergen Reactions  . Crab [Shellfish Allergy] Rash  . Hydrocodone Hives  . Adhesive [Tape] Rash  . Oxycodone Rash    FAMILY HISTORY:  family history is not on file.  Pt denies any lung issues in family.   SOCIAL HISTORY:  reports that he quit smoking about 22 years ago. His smoking use included Pipe. He does not have any smokeless tobacco history on file. He  reports that he drinks alcohol. He reports that he does not use illicit drugs.  Married, pipe smoker quit 10 yrs ago.   REVIEW OF SYSTEMS:   Constitutional: Negative for  chills, weight loss, malaise/fatigue and diaphoresis. (+) fever HENT: Negative for hearing loss, ear pain, nosebleeds, congestion, sore throat, neck pain, tinnitus and ear discharge.   Eyes: Negative for blurred vision, double vision, photophobia, pain, discharge and redness.  Respiratory: Negative for cough, hemoptysis, sputum production, shortness of breath, wheezing and stridor on admission. Started with sob, wheezing and fever  Cardiovascular: Negative for chest pain, orthopnea, claudication, leg swelling and PND. (+) palpitations Gastrointestinal: Negative for heartburn, nausea, vomiting, abdominal pain, diarrhea, constipation, blood in stool and melena.  Genitourinary: Negative for dysuria, urgency, frequency, hematuria and flank pain.  Musculoskeletal: Negative for myalgias, back pain, joint pain and falls.  Skin: Negative for itching and rash.  Neurological: Negative for dizziness, tingling, tremors, sensory change, speech change, focal weakness, seizures, loss of consciousness, weakness and headaches.  Endo/Heme/Allergies: Negative for environmental allergies and polydipsia. Does not bruise/bleed easily.  SUBJECTIVE:  Pt was seen in resp distress. SOB with speaking in sentences.   VITAL SIGNS:  Temp:  [97.7 F (36.5 C)-101.7 F (38.7 C)] 101.2 F (38.4 C) (02/22 1012) Pulse Rate:  [86-145] 111 (02/22 1042) Resp:  [20-26] 26 (02/22 1042) BP: (138-169)/(71-99) 169/99 mmHg (02/22 1014) SpO2:  [92 %-100 %] 100 % (02/22 1042) FiO2 (%):  [100 %] 100 % (02/22 1042) Weight:  [218 lb (98.884 kg)-218 lb 8 oz (99.111 kg)] 218 lb 8 oz (99.111 kg) (02/21 1732)  PHYSICAL EXAMINATION: General:  In resp distress. Uncomfortable.  Neuro:  CN grossly intact. (-) lateralizing signs.  HEENT:  PERLA; (+) corneals. (-) NVD. (-)  oral thrush. (-) LAD Cardiovascular:  Tachycardic. Good s1/s2. (-) m/r/g Lungs:  (+) accessory muscle use. (+) audible wheezing. Wheezing bilaterally. Some crackles at the bases. No rhonchi.  Abdomen:  Good bowel sounds, soft, no masses or tenderness. No organomegaly noted. Musculoskeletal:  Trace edema. No clubbing or cyanosis. Skin:  Warm and dry.   Recent Labs Lab 07/28/15 1157 07/29/15 0400  NA 140 136  K 4.4 4.2  CL 103 105  CO2 23 23  BUN 14 14  CREATININE 1.33* 1.04  GLUCOSE 131* 126*    Recent Labs Lab 07/29/15 0817  HGB 9.8*  HCT 31.5*  WBC 8.5  PLT 186   Dg Chest 2 View  07/29/2015  CLINICAL DATA:  Fever, wheezing, shortness of Breath EXAM: CHEST  2 VIEW COMPARISON:  04/30/2012 FINDINGS: Patchy airspace disease noted bilaterally in the upper lobes and lung bases. There is cardiomegaly. No visible effusions or acute bony abnormality. IMPRESSION: Cardiomegaly with patchy bilateral airspace disease, most confluent in the upper lobes. Findings concerning for multifocal pneumonia. Edema is possible but felt less likely. Electronically Signed   By: Rolm Baptise M.D.   On: 07/29/2015 09:29    ASSESSMENT / PLAN:  Jerry [name] is a 82 y.o. male with a past medical history significant for hypertension, hyperlipidemia, CAD, prostate cancer s/p seed implant, questionable OSA. He was first diagnosed with atrial fibrillation in 06/2015. He underwent TEE/DCCV and had ERAF. He is symptomatic with atrial fibrillation with shortness of breath. Patient was admitted on 2/21 for Tikosyn loading.   The last 24 hours, patient with high-grade fevers, cough, hemoptysis, wheezing, shortness of breath. Chest x-ray with bilateral infiltrates consistent with multifocal edema.  A> Multifocal pneumonia. Differentials include bacterial pneumonia, viral pneumonia, possible influenza. Patient could also be having allergic reaction to medicine but he was afebrile before he got the medicine.  Less likely pulmonary edema. Possible obstructive sleep apnea. P> --BiPAP 12/5, 2 L oxygen, keep sats more than 88%. --Transferred to stepdown. --Sputum culture, MRSA screen, influenza swab. --Check pro calcitonin --Start Rocephin, vancomycin. De-escalate once with cultures. --As he is tachycardic, will avoid beta agonists. We'll start Pulmicort twice a day, Atrovent every 4 hours. --Check ABG. --Medrol, 40 mg every 6 hours. --PUD prophylaxis  A> atrial fibrillation. P >  --per primary team. --on Eliquis  A> possible OSA -- needs outpt PSG   J. Tacey Ruiz, MD Pulmonary and [place] Pager: [phone] After 3 pm or if no answer, call [phone]  07/29/2015, 10:45 AM  Addendum : pt last seen at 11am. Doing better on bipap. Flu was (-). Cultures sent. Lactic acid was N. Will keep on bipap. May try off bipap later today or in am.  07/29/15 12 nn

## 2015-07-29 NOTE — Progress Notes (Signed)
SUBJECTIVE: The patient developed a fever last night and has been wheezing this morning. He denies chest pain.  CURRENT MEDICATIONS: . apixaban  5 mg Oral BID  . atorvastatin  40 mg Oral QHS  . cholecalciferol  1,000 Units Oral QHS  . diltiazem  240 mg Oral Daily  . dofetilide  500 mcg Oral BID  . losartan  100 mg Oral Daily  . metoprolol succinate  25 mg Oral Daily  . mirabegron ER  25 mg Oral QHS  . pantoprazole  40 mg Oral Daily  . potassium chloride  20 mEq Oral Daily  . psyllium  1 packet Oral Daily  . sodium chloride flush  3 mL Intravenous Q12H  . tamsulosin  0.4 mg Oral QHS      OBJECTIVE: Physical Exam: Filed Vitals:   07/28/15 2042 07/29/15 0032 07/29/15 0238 07/29/15 0512  BP: 138/77   142/91  Pulse: 102   104  Temp: 101.7 F (38.7 C) 99.7 F (37.6 C)  97.7 F (36.5 C)  TempSrc: Oral Oral  Oral  Resp: 20   22  Height:   5\' 10"  (1.778 m)   Weight:      SpO2: 93%   100%    Intake/Output Summary (Last 24 hours) at 07/29/15 0831 Last data filed at 07/29/15 0100  Gross per 24 hour  Intake    240 ml  Output    100 ml  Net    140 ml    Telemetry reveals atrial fibrillation with occasional PVC's  GEN- The patient is elderly appearing, alert and oriented x 3 today.   Head- normocephalic, atraumatic Eyes-  Sclera clear, conjunctiva pink Ears- hearing intact Oropharynx- clear Neck- supple Lungs- increased work of breathing,+wheezing and rhonchi R>L Heart- Regular rate and rhythm, no murmurs, rubs or gallops  GI- soft, NT, ND, + BS Extremities- no clubbing, cyanosis, or edema Skin- no rash or lesion Psych- euthymic mood, full affect Neuro- strength and sensation are intact  LABS: Basic Metabolic Panel:  Recent Labs  07/28/15 1157 07/28/15 2200 07/29/15 0400  NA 140  --  136  K 4.4  --  4.2  CL 103  --  105  CO2 23  --  23  GLUCOSE 131*  --  126*  BUN 14  --  14  CREATININE 1.33*  --  1.04  CALCIUM 9.2  --  8.7*  MG 1.3* 2.6* 2.2     ASSESSMENT AND PLAN:  Active Problems:   Persistent atrial fibrillation (HCC)   1. Persistent atrial fibrillation The patient has symptomatic persistent atrial fibrillation and has failed DCCV off of AAD therapy. He presents today for Tikosyn initiation. QTc, K, Mg stable this morning Continue Eliquis for CHADS2VASC of 4 If remains in AF after 3 doses of Tikosyn, will need DCCV  2. ?OSA Would recommend formal testing  3. HTN Stable No change required today  4. CAD No recent ischemic symptoms   5. Wheezing, shortness of breath, fever Concern for possible pneumonia Will check CXR and CBC Blood and urine cultures sent last night May need to ask hospitalist to see.  If requires antibiotics, will need to be careful to avoid QT prolonging drugs with Sherie Don, NP 07/29/2015 9:03 AM   I have seen, examined the patient, and reviewed the above assessment and plan.  On exam, irrr.  Labored breathing and exam consistent with probable R sided pneumonia.  Changes to above are made where necessary.  CXR pending.  Will ask hospitalist to see if not improved.  Would not be able to given azithro or flouroquinolone with initiation of tikosyn.  Guarded condition will be followed closely.  Co Sign: [name], MD 07/29/2015 9:10 AM

## 2015-07-29 NOTE — Progress Notes (Signed)
Pt temp 102.3 taken rectally.

## 2015-07-29 NOTE — Progress Notes (Signed)
Utilization review completed.  

## 2015-07-29 NOTE — Progress Notes (Signed)
CXR reveals likely multifocal pneumonia.  Will ask pulmonary to see and assist with management.  Will need to avoid QT prolonging drugs Discussed with Dr Rayann Heman, will proceed with Tikosyn load.  Chanetta Marshall, NP 07/29/2015 9:43 AM

## 2015-07-29 NOTE — Care Management Note (Signed)
Case Management Note Marvetta Gibbons RN, BSN Unit 2W-Case Manager [phone]  Patient Details  Name: Jerry [name] MRN: [id number] Date of Birth: [birth year]  Subjective/Objective:    Pt admitted with afib for Tikosyn load                Action/Plan: PTA pt lived at home with wife- anticipate return home- referral for Tikosyn assistance received- have submitted request for benefits check- 07/29/15- will f/u once info received- pt with resp. Decline - placed on BIPAP- plan to tx pt- CM will continue to f/u on d/c needs and Tikosyn needs  Expected Discharge Date:                  Expected Discharge Plan:  [place]  In-House Referral:     Discharge planning Services  CM Consult, Medication Assistance  Post Acute Care Choice:    Choice offered to:     DME Arranged:    DME Agency:     HH Arranged:    HH Agency:     Status of Service:  In process, will continue to follow  Medicare Important Message Given:    Date Medicare IM Given:    Medicare IM give by:    Date Additional Medicare IM Given:    Additional Medicare Important Message give by:     If discussed at [place] of Stay Meetings, dates discussed:    Additional Comments:  Dawayne Patricia, RN 07/29/2015, 11:11 AM

## 2015-07-30 DIAGNOSIS — J9601 Acute respiratory failure with hypoxia: Secondary | ICD-10-CM

## 2015-07-30 LAB — BASIC METABOLIC PANEL
ANION GAP: 12 (ref 5–15)
BUN: 21 mg/dL — AB (ref 6–20)
CHLORIDE: 101 mmol/L (ref 101–111)
CO2: 24 mmol/L (ref 22–32)
Calcium: 8.7 mg/dL — ABNORMAL LOW (ref 8.9–10.3)
Creatinine, Ser: 1.04 mg/dL (ref 0.61–1.24)
Glucose, Bld: 166 mg/dL — ABNORMAL HIGH (ref 65–99)
POTASSIUM: 4.3 mmol/L (ref 3.5–5.1)
SODIUM: 137 mmol/L (ref 135–145)

## 2015-07-30 LAB — URINE CULTURE

## 2015-07-30 LAB — MAGNESIUM: MAGNESIUM: 2.1 mg/dL (ref 1.7–2.4)

## 2015-07-30 MED ORDER — METHYLPREDNISOLONE SODIUM SUCC 40 MG IJ SOLR
40.0000 mg | Freq: Three times a day (TID) | INTRAMUSCULAR | Status: DC
  Administered 2015-07-30 – 2015-07-31 (×4): 40 mg via INTRAVENOUS
  Filled 2015-07-30 (×3): qty 1

## 2015-07-30 MED ORDER — IPRATROPIUM BROMIDE 0.02 % IN SOLN
0.5000 mg | Freq: Two times a day (BID) | RESPIRATORY_TRACT | Status: DC
  Administered 2015-07-30 – 2015-07-31 (×2): 0.5 mg via RESPIRATORY_TRACT
  Filled 2015-07-30 (×2): qty 2.5

## 2015-07-30 NOTE — Progress Notes (Signed)
    SUBJECTIVE: The patient continues to have SOB and fevers.  Making slow improvement.  CURRENT MEDICATIONS: . apixaban  5 mg Oral BID  . atorvastatin  40 mg Oral QHS  . budesonide (PULMICORT) nebulizer solution  0.5 mg Nebulization BID  . cefTRIAXone (ROCEPHIN)  IV  2 g Intravenous Q24H  . cholecalciferol  1,000 Units Oral QHS  . diltiazem  240 mg Oral Daily  . dofetilide  500 mcg Oral BID  . ipratropium  0.5 mg Nebulization Q4H  . losartan  100 mg Oral Daily  . methylPREDNISolone (SOLU-MEDROL) injection  40 mg Intravenous Q6H  . metoprolol succinate  25 mg Oral Daily  . mirabegron ER  25 mg Oral QHS  . pantoprazole  40 mg Oral Daily  . potassium chloride  20 mEq Oral Daily  . psyllium  1 packet Oral Daily  . sodium chloride flush  3 mL Intravenous Q12H  . tamsulosin  0.4 mg Oral QHS  . vancomycin  1,250 mg Intravenous Q24H      OBJECTIVE: Physical Exam: Filed Vitals:   07/30/15 0331 07/30/15 0604 07/30/15 0800 07/30/15 0901  BP:  118/76 135/69   Pulse:  89 91 94  Temp:  97.5 F (36.4 C) 98.2 F (36.8 C)   TempSrc:  Oral Oral   Resp:  18 18 20   Height:      Weight:  214 lb 3.2 oz (97.16 kg)    SpO2: 98% 99% 95% 95%    Intake/Output Summary (Last 24 hours) at 07/30/15 0920 Last data filed at 07/30/15 0143  Gross per 24 hour  Intake   1150 ml  Output    500 ml  Net    650 ml    Telemetry reveals atrial fibrillation   GEN- The patient is elderly and ill appearing, alert and oriented x 3 today.   Head- normocephalic, atraumatic Eyes-  Sclera clear, conjunctiva pink Ears- hearing intact Oropharynx- clear Neck- supple Lungs- increased work of breathing, less wheezing Heart- irregular rate and rhythm  GI- soft, NT, ND, + BS Extremities- no clubbing, cyanosis, or edema Skin- no rash or lesion Psych- euthymic mood, full affect Neuro- strength and sensation are intact  LABS: Basic Metabolic Panel:  Recent Labs  07/29/15 0400 07/30/15 0500  NA 136 137    K 4.2 4.3  CL 105 101  CO2 23 24  GLUCOSE 126* 166*  BUN 14 21*  CREATININE 1.04 1.04  CALCIUM 8.7* 8.7*  MG 2.2 2.1    ASSESSMENT AND PLAN:  Principal Problem:   Persistent atrial fibrillation (HCC) Active Problems:   Fever   Acute respiratory failure (HCC)   1. Persistent atrial fibrillation The patient has symptomatic persistent atrial fibrillation and has failed DCCV off of AAD therapy. He presents today for Tikosyn initiation. QTc, K, Mg stable this morning Continue Eliquis for CHADS2VASC of 4 Will have to hold off on cardioversion until he is clinically improved from pneumonia.  2. Acute hypoxic respiratory failure/ pneumonia Slowly improving.  Appreciate pulmonary assistance.  Continue antibiotics, steroids, and supportive care  3. HTN Stable No change required today  4. CAD No recent ischemic symptoms   [name], MD 07/30/2015 9:20 AM

## 2015-07-30 NOTE — Progress Notes (Signed)
Name: Jerry [name] MRN: [id number] DOB: 09-Nov-1929    ADMISSION DATE:  07/28/2015 CONSULTATION DATE:  07/29/15  REFERRING MD :  Dr. Delrae Alfred / Cards EP  CHIEF COMPLAINT:  Pt admitted for Tikosyn loading  BRIEF PATIENT DESCRIPTION:  Jerry [name] is a 82 y.o. male with a past medical history significant for hypertension, hyperlipidemia, CAD, prostate cancer s/p seed implant, questionable OSA. He was first diagnosed with atrial fibrillation in 06/2015. He underwent TEE/DCCV and had ERAF. He is symptomatic with atrial fibrillation with shortness of breath. Pt was admitted on 2/21 for Tikosyn loading. Pt with fever to 101F on night of admission. Fever persisted. Had resp distress, wheezing on 2/22. CXR with B infiltrates. PCCM consulted.    SIGNIFICANT EVENTS  2/21 admitted for Tikosyn loading 2/22 pt with resp distress, wheeze, fever. CXR with multifocal infiltrates. PCCM consulted.   STUDIES:  CXR (2/21) Multifocal infiltrates. PNA considered.    SUBJECTIVE:  NAEON.  Vitals stable, currently on 4L O2.  VITAL SIGNS: Temp:  [97.5 F (36.4 C)-102.3 F (39.1 C)] 98.2 F (36.8 C) (02/23 0800) Pulse Rate:  [28-133] 94 (02/23 0901) Resp:  [18-25] 20 (02/23 0901) BP: (109-152)/(64-90) 135/69 mmHg (02/23 0800) SpO2:  [94 %-100 %] 95 % (02/23 0901) FiO2 (%):  [30 %-100 %] 30 % (02/22 1219) Weight:  [97.16 kg (214 lb 3.2 oz)] 97.16 kg (214 lb 3.2 oz) (02/23 0604)  PHYSICAL EXAMINATION: General:  Resting in bed comfortably, in NAD. Neuro:  CN grossly intact. MAE's. HEENT:  PERRL.  MMM. Dentition intact. Cardiovascular:  IRIR.  No M/R/G. Lungs:  Resps shallow and unlabored.  Faint wheezes bilaterally. Abdomen:  Good bowel sounds, soft, no masses or tenderness. No organomegaly noted. Musculoskeletal:  Trace edema. No clubbing or cyanosis. Skin:  Warm and dry.   Recent Labs Lab 07/28/15 1157 07/29/15 0400 07/30/15 0500  NA 140 136 137  K 4.4 4.2 4.3  CL 103 105 101    CO2 23 23 24   BUN 14 14 21*  CREATININE 1.33* 1.04 1.04  GLUCOSE 131* 126* 166*    Recent Labs Lab 07/29/15 0817  HGB 9.8*  HCT 31.5*  WBC 8.5  PLT 186   Dg Chest 2 View  07/29/2015  CLINICAL DATA:  Fever, wheezing, shortness of Breath EXAM: CHEST  2 VIEW COMPARISON:  04/30/2012 FINDINGS: Patchy airspace disease noted bilaterally in the upper lobes and lung bases. There is cardiomegaly. No visible effusions or acute bony abnormality. IMPRESSION: Cardiomegaly with patchy bilateral airspace disease, most confluent in the upper lobes. Findings concerning for multifocal pneumonia. Edema is possible but felt less likely. Electronically Signed   By: Rolm Baptise M.D.   On: 07/29/2015 09:29    ASSESSMENT / PLAN:  Jerry [name] is a 82 y.o. male with a past medical history significant for hypertension, hyperlipidemia, CAD, prostate cancer s/p seed implant, questionable OSA. He was first diagnosed with atrial fibrillation in 06/2015. He underwent TEE/DCCV and had ERAF. He is symptomatic with atrial fibrillation with shortness of breath. Patient was admitted on 2/21 for Tikosyn loading.  On 02/22, he had high-grade fevers, cough, hemoptysis, wheezing, shortness of breath. Chest x-ray with bilateral infiltrates consistent with multifocal edema.  PCCM was called for further recs.  A> Acute hypoxic respiratory failure - unclear etiology but possible PNAA vs edema vs A.fib related.  Flu PCR was negative.  Less likely allergic reaction to meds.  Also has component of possible obstructive sleep apnea. P> Continue BiPAP  12/5, 2 L oxygen, keep sats more than 88%. Follow sputum culture. Continue rocephin. Consider de-escalate abx in AM 02/24 if WBC and PCT remain low. Continue BD's, steroids. CXR in AM.  A> atrial fibrillation. P >  Continue management per primary team.  A> possible OSA Needs outpt PSG   Montey Hora, PA - [facility] Pager: [phone]  or [phone] 07/30/2015, 11:55 AM

## 2015-07-30 NOTE — Care Management (Addendum)
Case Management Note Initial Note started by Marvetta Gibbons RN, BSN @ [phone] Unit 2W-Case Manager [phone]  Patient Details  Name: Jerry [name] MRN: [id number] Date of Birth: August 28, 1929  Subjective/Objective: Pt admitted with afib for Tikosyn load    Action/Plan: PTA pt lived at home with wife- anticipate return home- referral for Tikosyn assistance received- have submitted request for benefits check- 07/29/15- will f/u once info received- pt with resp. Decline - placed on BIPAP- plan to tx pt- CM will continue to f/u on d/c needs and Tikosyn needs  Expected Discharge Date:     Expected Discharge Plan: [place]  In-House Referral:    Discharge planning Services CM Consult, Medication Assistance  Post Acute Care Choice:   Choice offered to:    DME Arranged:   DME Agency:    HH Arranged:   HH Agency:    Status of Service: Completed.   Medicare Important Message Given:   Date Medicare IM Given:   Medicare IM give by:   Date Additional Medicare IM Given:   Additional Medicare Important Message give by:    If discussed at [place] of Stay Meetings, dates discussed:   Additional Comments:  [place] 08-03-15 Jacqlyn Krauss, RN, BSN [phone] Plan will be for d/c to SNF once stable. CSW assisting with disposition needs.     CM did call Pt's pharmacy [facility] and was informed that pt has insurance coverage. Pharmacy can order Tikosyn. CM will assist with the 7 day supply of Tikosyn before d/c. Pt will need Rx for 7 day supply no refills and the original Rx with refills. Pt would like to have 90 day Rx sent to [facility]. No further needs from CM at this time.   Member ID # [id number] Group # [id number].   Per rep at [facility]:   Tikosyn capsule: Tier 3, non preferred, $100 for 90 day mail order, $40 for 30 day retail/ no auth required  Dofetilide: $12 for 90 day mail  order, $5 for 30 day retail/ no auth required

## 2015-07-30 NOTE — Progress Notes (Signed)
Pt c/o wheezing and SOB, PRN neb given.  Edward Qualia RN

## 2015-07-31 ENCOUNTER — Inpatient Hospital Stay (HOSPITAL_COMMUNITY): Payer: Medicare Other

## 2015-07-31 DIAGNOSIS — J189 Pneumonia, unspecified organism: Secondary | ICD-10-CM | POA: Insufficient documentation

## 2015-07-31 LAB — CBC
HCT: 27.4 % — ABNORMAL LOW (ref 39.0–52.0)
Hemoglobin: 8.6 g/dL — ABNORMAL LOW (ref 13.0–17.0)
MCH: 25.3 pg — AB (ref 26.0–34.0)
MCHC: 31.4 g/dL (ref 30.0–36.0)
MCV: 80.6 fL (ref 78.0–100.0)
PLATELETS: 234 10*3/uL (ref 150–400)
RBC: 3.4 MIL/uL — AB (ref 4.22–5.81)
RDW: 18 % — ABNORMAL HIGH (ref 11.5–15.5)
WBC: 19 10*3/uL — ABNORMAL HIGH (ref 4.0–10.5)

## 2015-07-31 LAB — BASIC METABOLIC PANEL
ANION GAP: 12 (ref 5–15)
BUN: 27 mg/dL — AB (ref 6–20)
CALCIUM: 8.8 mg/dL — AB (ref 8.9–10.3)
CO2: 22 mmol/L (ref 22–32)
Chloride: 100 mmol/L — ABNORMAL LOW (ref 101–111)
Creatinine, Ser: 0.86 mg/dL (ref 0.61–1.24)
GFR calc Af Amer: >60 mL/min (ref >60)
GLUCOSE: 154 mg/dL — AB (ref 65–99)
Potassium: 4.5 mmol/L (ref 3.5–5.1)
SODIUM: 134 mmol/L — AB (ref 135–145)

## 2015-07-31 LAB — CBC AND DIFFERENTIAL: WBC: 19 10*3/mL

## 2015-07-31 LAB — PROCALCITONIN: PROCALCITONIN: 0.19 ng/mL

## 2015-07-31 LAB — MAGNESIUM: MAGNESIUM: 1.9 mg/dL (ref 1.7–2.4)

## 2015-07-31 LAB — PHOSPHORUS: PHOSPHORUS: 4 mg/dL (ref 2.5–4.6)

## 2015-07-31 MED ORDER — LEVALBUTEROL HCL 1.25 MG/0.5ML IN NEBU: 1.2500 mg | INHALATION_SOLUTION | Freq: Four times a day (QID) | RESPIRATORY_TRACT | Status: DC | PRN

## 2015-07-31 MED ORDER — IPRATROPIUM BROMIDE 0.02 % IN SOLN
0.5000 mg | Freq: Three times a day (TID) | RESPIRATORY_TRACT | Status: DC
  Administered 2015-07-31 – 2015-08-02 (×6): 0.5 mg via RESPIRATORY_TRACT
  Filled 2015-07-31 (×6): qty 2.5

## 2015-07-31 MED ORDER — METHYLPREDNISOLONE SODIUM SUCC 40 MG IJ SOLR
40.0000 mg | Freq: Two times a day (BID) | INTRAMUSCULAR | Status: DC
  Administered 2015-08-01 – 2015-08-02 (×3): 40 mg via INTRAVENOUS
  Filled 2015-07-31 (×3): qty 1

## 2015-07-31 MED ORDER — LEVALBUTEROL HCL 1.25 MG/0.5ML IN NEBU
1.2500 mg | INHALATION_SOLUTION | Freq: Three times a day (TID) | RESPIRATORY_TRACT | Status: DC
  Administered 2015-07-31 – 2015-08-02 (×6): 1.25 mg via RESPIRATORY_TRACT
  Filled 2015-07-31 (×6): qty 0.5

## 2015-07-31 NOTE — Care Management Important Message (Signed)
Important Message  Patient Details  Name: [name] MRN: [id number] Date of Birth: 1929/09/06   Medicare Important Message Given:  Yes    Bond Grieshop Abena 07/31/2015, 11:45 AM

## 2015-07-31 NOTE — Progress Notes (Signed)
SUBJECTIVE: The patient is improved today. Still with low grade fever last night.   CURRENT MEDICATIONS: . apixaban  5 mg Oral BID  . atorvastatin  40 mg Oral QHS  . budesonide (PULMICORT) nebulizer solution  0.5 mg Nebulization BID  . cefTRIAXone (ROCEPHIN)  IV  2 g Intravenous Q24H  . cholecalciferol  1,000 Units Oral QHS  . diltiazem  240 mg Oral Daily  . dofetilide  500 mcg Oral BID  . ipratropium  0.5 mg Nebulization BID  . losartan  100 mg Oral Daily  . methylPREDNISolone (SOLU-MEDROL) injection  40 mg Intravenous 3 times per day  . metoprolol succinate  25 mg Oral Daily  . mirabegron ER  25 mg Oral QHS  . pantoprazole  40 mg Oral Daily  . potassium chloride  20 mEq Oral Daily  . psyllium  1 packet Oral Daily  . sodium chloride flush  3 mL Intravenous Q12H  . tamsulosin  0.4 mg Oral QHS  . vancomycin  1,250 mg Intravenous Q24H      OBJECTIVE: Physical Exam: Filed Vitals:   07/30/15 0901 07/30/15 2122 07/31/15 0006 07/31/15 0603  BP:  127/72 129/77 130/78  Pulse: 94 100 92 95  Temp:  98.6 F (37 C) 99 F (37.2 C) 97.8 F (36.6 C)  TempSrc:  Oral Oral Oral  Resp: 20 18 18 18   Height:      Weight:    216 lb 1.6 oz (98.022 kg)  SpO2: 95% 96% 96% 96%    Intake/Output Summary (Last 24 hours) at 07/31/15 [id number] Last data filed at 07/31/15 [id number]  Gross per 24 hour  Intake   1380 ml  Output   1400 ml  Net    -20 ml    Telemetry reveals rate controlled atrial fibrillation   GEN- The patient is elderly and ill appearing, alert and oriented x 3 today.   Head- normocephalic, atraumatic Eyes-  Sclera clear, conjunctiva pink Ears- hearing intact Oropharynx- clear Neck- supple Lungs- normal work of breathing, less wheezing Heart- irregular rate and rhythm  GI- soft, NT, ND, + BS Extremities- no clubbing, cyanosis, or edema Skin- no rash or lesion Psych- euthymic mood, full affect Neuro- strength and sensation are intact  LABS: Basic Metabolic  Panel:  Recent Labs  07/30/15 0500 07/31/15 0440  NA 137 134*  K 4.3 4.5  CL 101 100*  CO2 24 22  GLUCOSE 166* 154*  BUN 21* 27*  CREATININE 1.04 0.86  CALCIUM 8.7* 8.8*  MG 2.1 1.9  PHOS  --  4.0    ASSESSMENT AND PLAN:  Principal Problem:   Persistent atrial fibrillation (HCC) Active Problems:   Fever   Acute respiratory failure (HCC)   1. Persistent atrial fibrillation The patient has symptomatic persistent atrial fibrillation and has failed DCCV off of AAD therapy. He presented for Tikosyn initiation. QTc, K, Mg stable this morning Continue Eliquis for CHADS2VASC of 4 Anticipate DCCV on Sunday if resp status stable.   2. Acute hypoxic respiratory failure/ pneumonia Improving.  Appreciate pulmonary assistance.  Continue antibiotics, steroids, and supportive care  3. HTN Stable No change required today  4. CAD No recent ischemic symptoms   If discharged this weekend, will need 1 week appt with AF clinic for labs and EKG, 4 weeks with AF clinic (I will schedule).   Chanetta Marshall, NP 07/31/2015 6:44 AM   I have seen, examined the patient, and reviewed the above assessment and plan. On exam, iRRR.  Respiratory status much improved. Changes to above are made where necessary.  Anticipate cardioversion on Sunday.  This can be arranged by calling anesthesia at [phone].  Co Sign: [name], MD 07/31/2015 7:33 AM

## 2015-07-31 NOTE — Progress Notes (Addendum)
PCCM Progress Note  Subjective: Still has cough.  Sputum decreased.  Breathing better. Denies wheeze.  Vital signs: BP 115/72 mmHg  Pulse 104  Temp(Src) 97.8 F (36.6 C) (Oral)  Resp 20  Ht 5\' 10"  (1.778 m)  Wt 216 lb 1.6 oz (98.022 kg)  BMI 31.01 kg/m2  SpO2 93%  Intake/outpt: I/O last 3 completed shifts: In: 1860 [P.O.:1560; IV Piggyback:300] Out: 1800 [Urine:1800]  General: pleasant, sitting in chair HEENT: wears glasses, no sinus tenderness Cardiac: irregular Chest: scattered rhonchi Abd: soft, non tender Ext: no edema Neuro: normal strength Skin: no rashes  CMP Latest Ref Rng 07/31/2015 07/30/2015 07/29/2015  Glucose 65 - 99 mg/dL 154(H) 166(H) 126(H)  BUN 6 - 20 mg/dL 27(H) 21(H) 14  Creatinine 0.61 - 1.24 mg/dL 0.86 1.04 1.04  Sodium 135 - 145 mmol/L 134(L) 137 136  Potassium 3.5 - 5.1 mmol/L 4.5 4.3 4.2  Chloride 101 - 111 mmol/L 100(L) 101 105  CO2 22 - 32 mmol/L 22 24 23   Calcium 8.9 - 10.3 mg/dL 8.8(L) 8.7(L) 8.7(L)  Total Protein 6.5 - 8.1 g/dL - - -  Total Bilirubin 0.3 - 1.2 mg/dL - - -  Alkaline Phos 38 - 126 U/L - - -  AST 15 - 41 U/L - - -  ALT 17 - 63 U/L - - -    CBC Latest Ref Rng 07/31/2015 07/29/2015 06/09/2015  WBC 4.0 - 10.5 K/uL 19.0(H) 8.5 8.3  Hemoglobin 13.0 - 17.0 g/dL 8.6(L) 9.8(L) 10.1(L)  Hematocrit 39.0 - 52.0 % 27.4(L) 31.5(L) 30.5(L)  Platelets 150 - 400 K/uL 234 186 300    Dg Chest Port 1 View  07/31/2015  CLINICAL DATA:  Respiratory failure, fever, atrial fibrillation. EXAM: PORTABLE CHEST 1 VIEW COMPARISON:  PA and lateral chest x-ray of July 29, 2015. FINDINGS: The lungs are well-expanded. Confluent alveolar opacity is present inferiorly in the right upper lobe and in the right infrahilar region. Patchy alveolar density in the left lower lobe is more distinct today. There is partial obscuration of the left hemidiaphragm which is not new. The cardiac silhouette is mildly enlarged. The pulmonary vascularity is not engorged.  IMPRESSION: Bilateral pneumonia. Stable cardiomegaly without significant pulmonary vascular congestion. Electronically Signed   By: David  Martinique M.D.   On: 07/31/2015 08:05    Significant events: 2/21 admitted for Tikosyn loading 2/22 pt with resp distress, wheeze, fever. CXR with multifocal infiltrates. PCCM consulted.   Cultures: Blood 2/22 >>  Antibiotics: Rocephin 2/22 >> Vancomycin 2/22 >>  Discussion: 82 yo male presented with dyspnea in setting of a fib.  He developed fever 101F, respiratory distress and b/l pulmonary infiltrates from PNA.  Assessment/plan:  Pneumonia. - day 3 of Abx >> narrow once cx final - f/u CXR intermittently  Acute hypoxic respiratory failure. - O2 to keep SpO2 > 92%  Wheezing improved. - continue pulmicort, atrovent, xopenex - change solumedrol to 40 mg q12h  A fib. - cards planning cardioversion  Chesley Mires, MD [facility] Care 07/31/2015, 3:38 PM Pager:  [phone] After 3pm call: [phone]

## 2015-08-01 LAB — BASIC METABOLIC PANEL
ANION GAP: 10 (ref 5–15)
BUN: 28 mg/dL — ABNORMAL HIGH (ref 6–20)
CHLORIDE: 99 mmol/L — AB (ref 101–111)
CO2: 24 mmol/L (ref 22–32)
Calcium: 8.5 mg/dL — ABNORMAL LOW (ref 8.9–10.3)
Creatinine, Ser: 0.99 mg/dL (ref 0.61–1.24)
GFR calc non Af Amer: >60 mL/min (ref >60)
Glucose, Bld: 214 mg/dL — ABNORMAL HIGH (ref 65–99)
POTASSIUM: 4.7 mmol/L (ref 3.5–5.1)
SODIUM: 133 mmol/L — AB (ref 135–145)

## 2015-08-01 LAB — MAGNESIUM: MAGNESIUM: 2.1 mg/dL (ref 1.7–2.4)

## 2015-08-01 NOTE — Progress Notes (Signed)
PCCM Progress Note  Subjective: Still has cough.  Sputum minimum/ mucoid   Vital signs: BP 125/72 mmHg  Pulse 94  Temp(Src) 98.6 F (37 C) (Oral)  Resp 24  Ht 5\' 10"  (1.778 m)  Wt 216 lb 11.2 oz (98.294 kg)  BMI 31.09 kg/m2  SpO2 95%  On 2lpm NP  Intake/outpt: I/O last 3 completed shifts: In: 1500 [P.O.:1200; IV Piggyback:300] Out: 1800 [Urine:1800]  General: pleasant, sitting up in bed no increased wob  HEENT: wears glasses, no sinus tenderness Cardiac: irregular Chest: scattered insp and exp rhonchi Abd: soft, non tender Ext: no edema Neuro: normal strength Skin: no rashes  CMP Latest Ref Rng 07/31/2015 07/30/2015 07/29/2015  Glucose 65 - 99 mg/dL 154(H) 166(H) 126(H)  BUN 6 - 20 mg/dL 27(H) 21(H) 14  Creatinine 0.61 - 1.24 mg/dL 0.86 1.04 1.04  Sodium 135 - 145 mmol/L 134(L) 137 136  Potassium 3.5 - 5.1 mmol/L 4.5 4.3 4.2  Chloride 101 - 111 mmol/L 100(L) 101 105  CO2 22 - 32 mmol/L 22 24 23   Calcium 8.9 - 10.3 mg/dL 8.8(L) 8.7(L) 8.7(L)  Total Protein 6.5 - 8.1 g/dL - - -  Total Bilirubin 0.3 - 1.2 mg/dL - - -  Alkaline Phos 38 - 126 U/L - - -  AST 15 - 41 U/L - - -  ALT 17 - 63 U/L - - -    CBC Latest Ref Rng 07/31/2015 07/29/2015 06/09/2015  WBC 4.0 - 10.5 K/uL 19.0(H) 8.5 8.3  Hemoglobin 13.0 - 17.0 g/dL 8.6(L) 9.8(L) 10.1(L)  Hematocrit 39.0 - 52.0 % 27.4(L) 31.5(L) 30.5(L)  Platelets 150 - 400 K/uL 234 186 300    Dg Chest Port 1 View  07/31/2015  CLINICAL DATA:  Respiratory failure, fever, atrial fibrillation. EXAM: PORTABLE CHEST 1 VIEW COMPARISON:  PA and lateral chest x-ray of July 29, 2015. FINDINGS: The lungs are well-expanded. Confluent alveolar opacity is present inferiorly in the right upper lobe and in the right infrahilar region. Patchy alveolar density in the left lower lobe is more distinct today. There is partial obscuration of the left hemidiaphragm which is not new. The cardiac silhouette is mildly enlarged. The pulmonary vascularity is not  engorged. IMPRESSION: Bilateral pneumonia. Stable cardiomegaly without significant pulmonary vascular congestion. Electronically Signed   By: David  Martinique M.D.   On: 07/31/2015 08:05    Significant events: 2/21 admitted for Tikosyn loading 2/22 pt with resp distress, wheeze, fever. CXR with multifocal infiltrates. PCCM consulted.   Cultures: Blood 2/22 >>  Antibiotics: Rocephin 2/22 >> Vancomycin 2/22 >> 2/25  Discussion: 82 yo male presented with dyspnea in setting of a fib.  He developed fever 101F, respiratory distress and b/l pulmonary infiltrates from PNA and defervesced nicely on empirical rx  Assessment/plan:  Pneumonia. - day 4 of Abx >> no indication for Vanc so d/c'd 1/25  - f/u CXR intermittently  Acute hypoxic respiratory failure. - O2 to keep SpO2 > 92%  Wheezing improved. - continue pulmicort, atrovent, xopenex - changed solumedrol to 40 mg q12h 2/24 tol fine   A fib. - cards planning cardioversion> ok to proceed from pulmonary perspective     Christinia Gully, MD Pulmonary and [place] [phone] After 5:30 PM or weekends, call [phone]

## 2015-08-01 NOTE — Progress Notes (Signed)
    SUBJECTIVE: The patient is improved today. No fevers since yesterday.  CURRENT MEDICATIONS: . apixaban  5 mg Oral BID  . atorvastatin  40 mg Oral QHS  . budesonide (PULMICORT) nebulizer solution  0.5 mg Nebulization BID  . cefTRIAXone (ROCEPHIN)  IV  2 g Intravenous Q24H  . cholecalciferol  1,000 Units Oral QHS  . diltiazem  240 mg Oral Daily  . dofetilide  500 mcg Oral BID  . ipratropium  0.5 mg Nebulization TID  . levalbuterol  1.25 mg Nebulization TID  . losartan  100 mg Oral Daily  . methylPREDNISolone (SOLU-MEDROL) injection  40 mg Intravenous Q12H  . metoprolol succinate  25 mg Oral Daily  . mirabegron ER  25 mg Oral QHS  . pantoprazole  40 mg Oral Daily  . potassium chloride  20 mEq Oral Daily  . psyllium  1 packet Oral Daily  . sodium chloride flush  3 mL Intravenous Q12H  . tamsulosin  0.4 mg Oral QHS      OBJECTIVE: Physical Exam: Filed Vitals:   07/31/15 2205 08/01/15 0523 08/01/15 0527 08/01/15 1044  BP: 120/80 125/72    Pulse: 94 94    Temp:  98.6 F (37 C)    TempSrc:  Oral    Resp: 29 24    Height:      Weight:   216 lb 11.2 oz (98.294 kg)   SpO2: 96% 95%  96%    Intake/Output Summary (Last 24 hours) at 08/01/15 1240 Last data filed at 08/01/15 0843  Gross per 24 hour  Intake   1140 ml  Output   1125 ml  Net     15 ml    Telemetry reveals rate controlled atrial fibrillation   GEN- The patient is elderly and ill appearing, alert and oriented x 3 today.   Head- normocephalic, atraumatic Eyes-  Sclera clear, conjunctiva pink Ears- hearing intact Oropharynx- clear Neck- supple Lungs- normal work of breathing, less wheezing Heart- irregular rate and rhythm  GI- soft, NT, ND, + BS Extremities- no clubbing, cyanosis, or edema Skin- no rash or lesion Psych- euthymic mood, full affect Neuro- strength and sensation are intact  LABS: Basic Metabolic Panel:  Recent Labs  07/31/15 0440 08/01/15 1040  NA 134* 133*  K 4.5 4.7  CL 100* 99*   CO2 22 24  GLUCOSE 154* 214*  BUN 27* 28*  CREATININE 0.86 0.99  CALCIUM 8.8* 8.5*  MG 1.9 2.1  PHOS 4.0  --     ASSESSMENT AND PLAN:  Principal Problem:   Persistent atrial fibrillation (HCC) Active Problems:   Fever   Acute respiratory failure (HCC)   Pneumonia due to organism   1. Persistent atrial fibrillation The patient has symptomatic persistent atrial fibrillation and has failed DCCV off of AAD therapy. He presented for Tikosyn initiation. QTc, K, Mg remain stable this morning Continue Eliquis for CHADS2VASC of 4 Anticipate DCCV on Sunday if resp status stable.   2. Acute hypoxic respiratory failure/ pneumonia Improving.  Appreciate pulmonary assistance.  Continue antibiotics, steroids, and supportive care.    3. HTN Stable No change required today  4. CAD No recent ischemic symptoms   Jerry Soley Curt Bears, MD  08/01/2015 12:40 PM

## 2015-08-02 ENCOUNTER — Encounter (HOSPITAL_COMMUNITY): Payer: Self-pay | Admitting: Certified Registered"

## 2015-08-02 ENCOUNTER — Inpatient Hospital Stay (HOSPITAL_COMMUNITY): Payer: Medicare Other | Admitting: Certified Registered"

## 2015-08-02 ENCOUNTER — Encounter (HOSPITAL_COMMUNITY)
Admission: AD | Disposition: A | Discharge status: Skilled Nursing Facility | Payer: Self-pay | Source: Ambulatory Visit | Attending: Internal Medicine

## 2015-08-02 ENCOUNTER — Other Ambulatory Visit: Payer: Self-pay

## 2015-08-02 DIAGNOSIS — I4891 Unspecified atrial fibrillation: Secondary | ICD-10-CM

## 2015-08-02 HISTORY — PX: CARDIOVERSION: SHX1299

## 2015-08-02 LAB — BASIC METABOLIC PANEL
ANION GAP: 9 (ref 5–15)
BUN: 27 mg/dL — AB (ref 6–20)
CHLORIDE: 103 mmol/L (ref 101–111)
CO2: 24 mmol/L (ref 22–32)
Calcium: 8.9 mg/dL (ref 8.9–10.3)
Creatinine, Ser: 0.96 mg/dL (ref 0.61–1.24)
GFR calc Af Amer: >60 mL/min (ref >60)
GLUCOSE: 151 mg/dL — AB (ref 65–99)
POTASSIUM: 4.7 mmol/L (ref 3.5–5.1)
SODIUM: 136 mmol/L (ref 135–145)

## 2015-08-02 LAB — MAGNESIUM: MAGNESIUM: 2.1 mg/dL (ref 1.7–2.4)

## 2015-08-02 SURGERY — CARDIOVERSION
Anesthesia: General

## 2015-08-02 MED ORDER — PROPOFOL 10 MG/ML IV BOLUS
INTRAVENOUS | Status: DC | PRN
  Administered 2015-08-02: 100 mg via INTRAVENOUS

## 2015-08-02 MED ORDER — SODIUM CHLORIDE 0.9% FLUSH: 3.0000 mL | INTRAVENOUS | Status: DC | PRN

## 2015-08-02 MED ORDER — LEVALBUTEROL HCL 1.25 MG/0.5ML IN NEBU: 1.2500 mg | INHALATION_SOLUTION | Freq: Four times a day (QID) | RESPIRATORY_TRACT | Status: DC | PRN

## 2015-08-02 MED ORDER — SODIUM CHLORIDE 0.9 % IV SOLN: 250.0000 mL | INTRAVENOUS | Status: DC

## 2015-08-02 MED ORDER — LIDOCAINE HCL (CARDIAC) 20 MG/ML IV SOLN
INTRAVENOUS | Status: DC | PRN
  Administered 2015-08-02: 20 mg via INTRAVENOUS

## 2015-08-02 MED ORDER — SODIUM CHLORIDE 0.9% FLUSH
3.0000 mL | Freq: Two times a day (BID) | INTRAVENOUS | Status: DC
  Administered 2015-08-03 – 2015-08-04 (×3): 3 mL via INTRAVENOUS

## 2015-08-02 NOTE — Procedures (Signed)
Procedure: Cardioversion Pre procedure diagnosis: Persistent atrial fibrillation Post procedure diagnosis: Persistent atrial fibrillation  After anesthesia sedation, patient underwent synchronized cardioverted with 200J energy to sinus rhythm.    Will Curt Bears, MD 08/02/2015 11:43 AM

## 2015-08-02 NOTE — Anesthesia Preprocedure Evaluation (Signed)
Anesthesia Evaluation  Patient identified by MRN, date of birth, ID band Patient awake    Reviewed: Allergy & Precautions, NPO status   Airway Mallampati: II  TM Distance: >3 FB Neck ROM: Full    Dental   Pulmonary shortness of breath, sleep apnea , former smoker,    breath sounds clear to auscultation       Cardiovascular hypertension, + CAD and + Past MI   Rhythm:Irregular Rate:Normal     Neuro/Psych    GI/Hepatic Neg liver ROS, GERD  ,  Endo/Other  negative endocrine ROS  Renal/GU negative Renal ROS     Musculoskeletal  (+) Arthritis ,   Abdominal   Peds  Hematology   Anesthesia Other Findings   Reproductive/Obstetrics                             Anesthesia Physical Anesthesia Plan  ASA: III  Anesthesia Plan: General   Post-op Pain Management:    Induction: Intravenous  Airway Management Planned: Mask  Additional Equipment:   Intra-op Plan:   Post-operative Plan:   Informed Consent: I have reviewed the patients History and Physical, chart, labs and discussed the procedure including the risks, benefits and alternatives for the proposed anesthesia with the patient or authorized representative who has indicated his/her understanding and acceptance.     Plan Discussed with: CRNA and Surgeon  Anesthesia Plan Comments:         Anesthesia Quick Evaluation

## 2015-08-02 NOTE — Progress Notes (Signed)
    SUBJECTIVE: The patient is improved today. No fevers since Friday.  Anxious about cardioversion.  CURRENT MEDICATIONS: . apixaban  5 mg Oral BID  . atorvastatin  40 mg Oral QHS  . budesonide (PULMICORT) nebulizer solution  0.5 mg Nebulization BID  . cefTRIAXone (ROCEPHIN)  IV  2 g Intravenous Q24H  . cholecalciferol  1,000 Units Oral QHS  . diltiazem  240 mg Oral Daily  . dofetilide  500 mcg Oral BID  . ipratropium  0.5 mg Nebulization TID  . levalbuterol  1.25 mg Nebulization TID  . losartan  100 mg Oral Daily  . methylPREDNISolone (SOLU-MEDROL) injection  40 mg Intravenous Q12H  . metoprolol succinate  25 mg Oral Daily  . mirabegron ER  25 mg Oral QHS  . pantoprazole  40 mg Oral Daily  . potassium chloride  20 mEq Oral Daily  . psyllium  1 packet Oral Daily  . sodium chloride flush  3 mL Intravenous Q12H  . tamsulosin  0.4 mg Oral QHS      OBJECTIVE: Physical Exam: Filed Vitals:   08/01/15 2024 08/01/15 2100 08/02/15 0500 08/02/15 0728  BP:  111/62 128/84   Pulse:  80 87   Temp:  97.8 F (36.6 C) 98 F (36.7 C)   TempSrc:      Resp:  24 24   Height:      Weight:   217 lb 11.2 oz (98.748 kg)   SpO2: 96% 98% 98% 98%    Intake/Output Summary (Last 24 hours) at 08/02/15 0730 Last data filed at 08/02/15 0500  Gross per 24 hour  Intake   1470 ml  Output    725 ml  Net    745 ml    Telemetry reveals rate controlled atrial fibrillation   GEN- The patient is elderly and ill appearing, alert and oriented x 3 today.   Head- normocephalic, atraumatic Eyes-  Sclera clear, conjunctiva pink Ears- hearing intact Oropharynx- clear Neck- supple Lungs- normal work of breathing, less wheezing Heart- irregular rate and rhythm  GI- soft, NT, ND, + BS Extremities- no clubbing, cyanosis, or edema Skin- no rash or lesion Psych- euthymic mood, full affect Neuro- strength and sensation are intact  LABS: Basic Metabolic Panel:  Recent Labs  07/31/15 0440  08/01/15 1040  NA 134* 133*  K 4.5 4.7  CL 100* 99*  CO2 22 24  GLUCOSE 154* 214*  BUN 27* 28*  CREATININE 0.86 0.99  CALCIUM 8.8* 8.5*  MG 1.9 2.1  PHOS 4.0  --     ASSESSMENT AND PLAN:  Principal Problem:   Persistent atrial fibrillation (HCC) Active Problems:   Fever   Acute respiratory failure (HCC)   Pneumonia due to organism   1. Persistent atrial fibrillation The patient has symptomatic persistent atrial fibrillation and has failed DCCV off of AAD therapy. He presented for Tikosyn initiation. QTc, K, Mg remain stable this morning Continue Eliquis for CHADS2VASC of 4 Janit Cutter attempt to arrange cardioversion today to see if he Jennifr Gaeta resume sinus rhythm.  2. Acute hypoxic respiratory failure/ pneumonia Improving.  Appreciate pulmonary assistance.  Continue antibiotics, steroids, and supportive care.    3. HTN Stable No change required today  4. CAD No recent ischemic symptoms   Shota Kohrs Curt Bears, MD  08/02/2015 7:30 AM

## 2015-08-02 NOTE — Progress Notes (Signed)
PCCM Progress Note  Subjective: Feeling much better / no cough or sob   Vital signs: BP 128/84 mmHg  Pulse 87  Temp(Src) 98 F (36.7 C) (Oral)  Resp 24  Ht 5\' 10"  (1.778 m)  Wt 217 lb 11.2 oz (98.748 kg)  BMI 31.24 kg/m2  SpO2 98%  On 2lpm NP  Intake/outpt: I/O last 3 completed shifts: In: 1590 [P.O.:1590] Out: 1225 [Urine:1225]  General: pleasant, sitting up in bed no increased wob  HEENT: wears glasses, no sinus tenderness Cardiac: irregular Chest: min insp and exp rhonchi Abd: soft, non tender Ext: min lower ext  edema Neuro: normal strength Skin: no rashes  CMP Latest Ref Rng 08/02/2015 08/01/2015 07/31/2015  Glucose 65 - 99 mg/dL 151(H) 214(H) 154(H)  BUN 6 - 20 mg/dL 27(H) 28(H) 27(H)  Creatinine 0.61 - 1.24 mg/dL 0.96 0.99 0.86  Sodium 135 - 145 mmol/L 136 133(L) 134(L)  Potassium 3.5 - 5.1 mmol/L 4.7 4.7 4.5  Chloride 101 - 111 mmol/L 103 99(L) 100(L)  CO2 22 - 32 mmol/L 24 24 22   Calcium 8.9 - 10.3 mg/dL 8.9 8.5(L) 8.8(L)    CBC Latest Ref Rng 07/31/2015 07/29/2015 06/09/2015  WBC 4.0 - 10.5 K/uL 19.0(H) 8.5 8.3  Hemoglobin 13.0 - 17.0 g/dL 8.6(L) 9.8(L) 10.1(L)  Hematocrit 39.0 - 52.0 % 27.4(L) 31.5(L) 30.5(L)  Platelets 150 - 400 K/uL 234 186 300    No results found.  Significant events: 2/21 admitted for Tikosyn loading 2/22 pt with resp distress, wheeze, fever. CXR with multifocal infiltrates. PCCM consulted.   Cultures: Blood 2/22 >>  Antibiotics: Rocephin 2/22 >> Vancomycin 2/22 >> 2/25  Discussion: 82 yo male presented with dyspnea in setting of a fib.  He developed fever 101F, respiratory distress and b/l pulmonary infiltrates from PNA and defervesced nicely on empirical rx  Assessment/plan:  Pneumonia. - day 5 of Abx >> no indication for Vanc so d/c'd 1/25  - f/u CXR 227   Acute hypoxic respiratory failure. - O2 to keep SpO2 > 92%  Wheezing resolved  - continue pulmicort, atrovent, xopenex - changed solumedrol to 40 mg q12h 2/24  tol fine > try off 2/26  A fib. - cards planning cardioversion> ok to proceed from pulmonary perspective     Christinia Gully, MD Pulmonary and [place] [phone] After 5:30 PM or weekends, call [phone]

## 2015-08-02 NOTE — Transfer of Care (Signed)
Immediate Anesthesia Transfer of Care Note  Patient: Jerry [name]  Procedure(s) Performed: Procedure(s): CARDIOVERSION (N/A)  Patient Location: PACU  Anesthesia Type:General  Level of Consciousness: awake, alert  and oriented  Airway & Oxygen Therapy: Patient Spontanous Breathing and Patient connected to nasal cannula oxygen  Post-op Assessment: Report given to RN, Post -op Vital signs reviewed and stable and Patient moving all extremities X 4  Post vital signs: Reviewed and stable  Last Vitals:  Filed Vitals:   08/01/15 2100 08/02/15 0500  BP: 111/62 128/84  Pulse: 80 87  Temp: 36.6 C 36.7 C  Resp: 24 24    Complications: No apparent anesthesia complications

## 2015-08-03 ENCOUNTER — Inpatient Hospital Stay (HOSPITAL_COMMUNITY): Payer: Medicare Other

## 2015-08-03 ENCOUNTER — Encounter (HOSPITAL_COMMUNITY): Payer: Self-pay | Admitting: Internal Medicine

## 2015-08-03 ENCOUNTER — Telehealth: Payer: Self-pay | Admitting: *Deleted

## 2015-08-03 LAB — CULTURE, BLOOD (ROUTINE X 2)
CULTURE: NO GROWTH
CULTURE: NO GROWTH

## 2015-08-03 LAB — BASIC METABOLIC PANEL
Anion gap: 9 (ref 5–15)
BUN: 28 mg/dL — AB (ref 4–21)
BUN: 28 mg/dL — AB (ref 6–20)
CALCIUM: 8.5 mg/dL — AB (ref 8.9–10.3)
CO2: 24 mmol/L (ref 22–32)
CREATININE: 0.8 mg/dL (ref 0.6–1.3)
CREATININE: 0.83 mg/dL (ref 0.61–1.24)
Chloride: 100 mmol/L — ABNORMAL LOW (ref 101–111)
GFR calc Af Amer: >60 mL/min (ref >60)
GLUCOSE: 118 mg/dL — AB (ref 65–99)
Glucose: 118 mg/dL
Potassium: 4.5 mmol/L (ref 3.5–5.1)
SODIUM: 133 mmol/L — AB (ref 137–147)
Sodium: 133 mmol/L — ABNORMAL LOW (ref 135–145)

## 2015-08-03 LAB — MAGNESIUM: Magnesium: 2 mg/dL (ref 1.7–2.4)

## 2015-08-03 NOTE — NC FL2 (Addendum)
[place] LEVEL OF CARE SCREENING TOOL     IDENTIFICATION  Patient Name: Jerry [name] Birthdate: 1930/03/04 Sex: male Admission Date (Current Location): 07/28/2015  [facility] and [place] Number:  Herbalist and Address:  [facility] [place], [place] [address], [place], [place] [address]      Provider Number: [id number]  Attending Physician Name and Address:  [name], MD  Relative Name and Phone Number:       Current Level of Care: Hospital Recommended Level of Care: [name] Prior Approval Number:    Date Approved/Denied:   PASRR Number:  [id number]  Discharge Plan: SNF    Current Diagnoses: Patient Active Problem List   Diagnosis Date Noted  . Pneumonia due to organism   . Fever   . Acute respiratory failure ([place])   . Persistent atrial fibrillation ([place])   . Atrial fibrillation ([place]) 06/09/2015  . Exertional dyspnea and fatigue 08/13/2013  . CAD S/P percutaneous coronary angioplasty   . Hyperlipidemia with target LDL less than 70   . Essential hypertension   . Obesity (BMI 30.0-34.9)   . Myocardial infarction; history of   . Osteoarthritis of right knee 05/11/2012    Orientation RESPIRATION BLADDER Height & Weight     Self, Time, Situation, Place  O2 (1L) Continent Weight: 217 lb 1.6 oz (98.476 kg) Height:  5\' 10"  (177.8 cm)  BEHAVIORAL SYMPTOMS/MOOD NEUROLOGICAL BOWEL NUTRITION STATUS   (n/a)  (n/a) Continent Diet (Heart Healthy)  AMBULATORY STATUS COMMUNICATION OF NEEDS Skin   Extensive Assist Verbally Normal                       Personal Care Assistance Level of Assistance  Bathing, Feeding, Dressing Bathing Assistance: Maximum assistance Feeding assistance: Limited assistance Dressing Assistance: Limited assistance     Functional Limitations Info  Sight, Hearing, Speech Sight Info: Adequate Hearing Info: Impaired Speech Info: Adequate    SPECIAL CARE FACTORS FREQUENCY  OT  (By licensed OT), PT (By licensed PT)     PT Frequency: 5x/week OT Frequency: 5x/week            Contractures Contractures Info: Not present    Additional Factors Info  Code Status, Allergies Code Status Info: FULL CODE Allergies Info: Crab, Hydrocodone, Adhesive, Oxycodone           Current Medications (08/03/2015):  This is the current hospital active medication list Current Facility-Administered Medications  Medication Dose Route Frequency Provider Last Rate Last Dose  . 0.9 %  sodium chloride infusion  250 mL Intravenous PRN [name], NP      . 0.9 %  sodium chloride infusion  250 mL Intravenous Continuous [name], PA   250 mL at 08/02/15 1254  . acetaminophen (TYLENOL) tablet 1,000 mg  1,000 mg Oral Daily PRN Patsey Berthold, NP   1,000 mg at 07/29/15 1337  . apixaban (ELIQUIS) tablet 5 mg  5 mg Oral BID Patsey Berthold, NP   5 mg at 08/03/15 0944  . atorvastatin (LIPITOR) tablet 40 mg  40 mg Oral QHS Patsey Berthold, NP   40 mg at 08/02/15 2154  . cefTRIAXone (ROCEPHIN) 2 g in dextrose 5 % 50 mL IVPB  2 g Intravenous Q24H Jose Shirl Harris, MD   2 g at 08/02/15 1429  . cholecalciferol (VITAMIN D) tablet 1,000 Units  1,000 Units Oral QHS Patsey Berthold, NP  1,000 Units at 08/02/15 2154  . diazepam (VALIUM) tablet 2 mg  2 mg Oral Daily PRN Patsey Berthold, NP      . diltiazem (CARDIZEM CD) 24 hr capsule 240 mg  240 mg Oral Daily Patsey Berthold, NP   240 mg at 08/03/15 0944  . dofetilide (TIKOSYN) capsule 500 mcg  500 mcg Oral BID [name], MD   500 mcg at 08/03/15 0945  . HYDROmorphone (DILAUDID) tablet 2 mg  2 mg Oral Q4H PRN [name], NP      . levalbuterol Penne Lash) nebulizer solution 1.25 mg  1.25 mg Nebulization Q6H PRN [name], MD      . losartan (COZAAR) tablet 100 mg  100 mg Oral Daily Patsey Berthold, NP   100 mg at 08/03/15 0944  . magnesium hydroxide (MILK OF MAGNESIA) suspension 30 mL  30 mL Oral Daily PRN Patsey Berthold, NP      .  metoprolol succinate (TOPROL-XL) 24 hr tablet 25 mg  25 mg Oral Daily Patsey Berthold, NP   25 mg at 08/03/15 0945  . mirabegron ER (MYRBETRIQ) tablet 25 mg  25 mg Oral QHS Patsey Berthold, NP   25 mg at 08/02/15 2154  . nitroGLYCERIN (NITROSTAT) SL tablet 0.4 mg  0.4 mg Sublingual Q5 min PRN [name], NP      . pantoprazole (PROTONIX) EC tablet 40 mg  40 mg Oral Daily Patsey Berthold, NP   40 mg at 08/03/15 0944  . polyethylene glycol (MIRALAX / GLYCOLAX) packet 17 g  17 g Oral Daily PRN [name], MD      . potassium chloride (K-DUR) CR tablet 20 mEq  20 mEq Oral Daily Patsey Berthold, NP   20 mEq at 08/03/15 0944  . psyllium (HYDROCIL/METAMUCIL) packet 1 packet  1 packet Oral Daily Patsey Berthold, NP   1 packet at 08/03/15 0945  . sodium chloride flush (NS) 0.9 % injection 3 mL  3 mL Intravenous Q12H Patsey Berthold, NP   3 mL at 08/02/15 2158  . sodium chloride flush (NS) 0.9 % injection 3 mL  3 mL Intravenous PRN [name], NP      . sodium chloride flush (NS) 0.9 % injection 3 mL  3 mL Intravenous Q12H [name], PA   3 mL at 08/03/15 0946  . sodium chloride flush (NS) 0.9 % injection 3 mL  3 mL Intravenous PRN [name], PA      . tamsulosin (FLOMAX) capsule 0.4 mg  0.4 mg Oral QHS Patsey Berthold, NP   0.4 mg at 08/02/15 2154     Discharge Medications: Please see discharge summary for a list of discharge medications.  Relevant Imaging Results:  Relevant Lab Results:   Additional Information SS#[id number]   [place] Intern, [id number]

## 2015-08-03 NOTE — Plan of Care (Signed)
Problem: Activity: Goal: Risk for activity intolerance will decrease Outcome: Progressing Patient was able to ambulate in the hall today with PT, Had a epiosode of O2 sats dropping to 81% on room air while walking. Patient returned to room and sat up in the chair , back on 2L O2 and tolerating well withO2 sats at 95%.

## 2015-08-03 NOTE — Progress Notes (Signed)
Patient at rest on room air, O2 sats at 90%, when patient walked with PT off O2 sats dropped to 81% and he felt short of breath.

## 2015-08-03 NOTE — Plan of Care (Signed)
Problem: Nutrition: Goal: Adequate nutrition will be maintained Outcome: Completed/Met Date Met:  08/03/15 Patient is on a cardiac diet and generally eats 85-100% of his meals and tolerates them well.

## 2015-08-03 NOTE — Telephone Encounter (Signed)
-----   Message from Leonie Man, MD sent at 08/03/2015  6:58 AM EST ----- I did not order these labs. He is s/p DCCV & was initiated on Tikosyn by [facility].   We need to increase Magnesium -- would take Mag Oxide BID. Also with slight increase in Cr - need to ensure adequate hydration. Will forward to EP.  Leonie Man, MD

## 2015-08-03 NOTE — Discharge Summary (Signed)
ELECTROPHYSIOLOGY PROCEDURE DISCHARGE SUMMARY    Patient ID: Jerry [name],  MRN: [id number], DOB/AGE: March 19, 1930 82 y.o.  Admit date: 07/28/2015 Discharge date: 08/04/15  Primary Care Physician: [name], MD Primary Cardiologist: [name] Electrophysiologist: (new) Dr. Rayann Heman  Primary Discharge Diagnosis:  1.  Paroxysmal atrial fibrillation status post Tikosyn loading this admission       CHA2DS2Vasc is at least 4 on Eliquis 2. Pneumonia  Secondary Discharge Diagnosis:  1. CAD 2. HTN 3. HLD  Allergies  Allergen Reactions  . Crab [Shellfish Allergy] Rash  . Hydrocodone Hives  . Adhesive [Tape] Rash  . Oxycodone Rash     Procedures This Admission:  1.  Tikosyn loading 2.  Direct current cardioversion on 08/02/15 by Dr Curt Bears which successfully restored SR.  There were no early apparent complications.   Brief HPI: Jerry [name] is a 82 y.o. male with a past medical history as noted above.  They were referred to EP in the outpatient setting for treatment options of atrial fibrillation.  Risks, benefits, and alternatives to Tikosyn were reviewed with the patient who wished to proceed.    Hospital Course:  The patient was admitted and Tikosyn was initiated.  Renal function and electrolytes were followed during the hospitalization.  Their QTc remained stable.  He developed fever, SOB/wheezing was seen by pulmonary medicine treated with BIPAP, antibiotics, pulmocort, medrol for pneumonia. He has remained afebrile for days, pulmonary service saw the patient, I spoke with Dr. Elsworth Soho, Augmentin 875mg  BID for 4 more days out patient, and out patient f/u CXR. He did desaturate yesterday with ambulation on RA and was held for another day of IV abx and monitoring. PT saw and evaluated the patient and recommended SNF for rehab, arrangements have been made with Case management.  Pulmonary recommended f/u out patient CXR I called the patient's PMD office myself, they are  aware of the need for f/u, and will accommodate the patient for next week, they will call him to schedule, the patient is as well being instructed to call and make f/u appointment if he does not hear from them.  On 08/02/15 he underwent direct current cardioversion which restored sinus rhythm. The patient was monitored until discharge on telemetry which demonstrated continued SR today, generally in the 60's.  On the day of discharge, they were examined by Dr Rayann Heman who considered them stable for discharge to SNF if needed with oxygen.  Discussed with case management, SNF will manage O2 needs and weaning off O2.  The patient states he feels well, denies cough/SOB, wants to be discharged.  Follow-up has been arranged with the Afib clinic in 1 week and in 4 weeks for EKG and labs (BMET/mag level). Discussed with Marshell Levan case Freight forwarder, he has confirmed Tikosyn is available at the SNF not to delay/miss dosing schedule.  Physical Exam: Filed Vitals:   08/04/15 0500 08/04/15 0830 08/04/15 1000 08/04/15 1013  BP: 121/64   112/59  Pulse: 64   71  Temp: 98 F (36.7 C)     TempSrc:      Resp: 21     Height:      Weight: 214 lb 12.8 oz (97.433 kg)     SpO2: 92% 94% 95%     GEN- The patient is well appearing, alert and oriented x 3 today.   HEENT: normocephalic, atraumatic; sclera clear, conjunctiva pink; hearing intact; oropharynx clear; neck supple, no JVP Lymph- no cervical lymphadenopathy Lungs- very soft scatterred ronchi, normal  work of breathing.  No wheezes, rales Heart- Regular rate and rhythm, no murmurs, rubs or gallops, PMI not laterally displaced GI- soft, non-tender Extremities- no clubbing, cyanosis, or edema MS- no significant deformity or atrophy Skin- warm and dry, no rash or lesion Psych- euthymic mood, full affect Neuro- strength and sensation are intact   Labs:   Lab Results  Component Value Date   WBC 19.0* 07/31/2015   HGB 8.6* 07/31/2015   HCT 27.4* 07/31/2015   MCV 80.6  07/31/2015   PLT 234 07/31/2015     Recent Labs Lab 08/03/15 0444  NA 133*  K 4.5  CL 100*  CO2 24  BUN 28*  CREATININE 0.83  CALCIUM 8.5*  GLUCOSE 118*     Discharge Medications:    Medication List    TAKE these medications        acetaminophen 500 MG tablet  Commonly known as:  TYLENOL  Take 1,000 mg by mouth daily as needed (pain).     amoxicillin 500 MG capsule  Commonly known as:  AMOXIL  Take 2,000 mg by mouth See admin instructions. Take 4 capsules (2000 mg) by mouth one hour before dental appointment     amoxicillin-clavulanate 875-125 MG tablet  Commonly known as:  AUGMENTIN  Take 1 tablet by mouth 2 (two) times daily.  Notes to Patient:  To complete 4 days upon discharge     apixaban 5 MG Tabs tablet  Commonly known as:  ELIQUIS  Take 1 tablet (5 mg total) by mouth 2 (two) times daily.     atorvastatin 40 MG tablet  Commonly known as:  LIPITOR  Take 40 mg by mouth at bedtime.     cholecalciferol 1000 units tablet  Commonly known as:  VITAMIN D  Take 1,000 Units by mouth at bedtime.     diazepam 2 MG tablet  Commonly known as:  VALIUM  Take 2 mg by mouth daily as needed for anxiety.     diltiazem 240 MG 24 hr capsule  Commonly known as:  CARDIZEM CD  Take 1 capsule (240 mg total) by mouth daily.     dofetilide 500 MCG capsule  Commonly known as:  TIKOSYN  Take 1 capsule (500 mcg total) by mouth 2 (two) times daily.     esomeprazole 40 MG capsule  Commonly known as:  NEXIUM  Take 1 capsule (40 mg total) by mouth daily.     hydrocortisone cream 1 %  Apply 1 application topically daily as needed for itching.     losartan 100 MG tablet  Commonly known as:  COZAAR  Take 1 tablet (100 mg total) by mouth daily.     Magnesium 200 MG Tabs  Take 1 tablet (200 mg total) by mouth 2 (two) times daily.     magnesium hydroxide 400 MG/5ML suspension  Commonly known as:  MILK OF MAGNESIA  Take 30 mLs by mouth daily as needed for mild constipation.       metoprolol succinate 25 MG 24 hr tablet  Commonly known as:  TOPROL-XL  Take 1 tablet (25 mg total) by mouth daily.     MIRALAX PO  Take 17 g by mouth daily as needed (Constipation).     MYRBETRIQ 25 MG Tb24 tablet  Generic drug:  mirabegron ER  Take 25 mg by mouth daily.     nitroGLYCERIN 0.4 MG SL tablet  Commonly known as:  NITROSTAT  Place 0.4 mg under the tongue every 5 (five) minutes  as needed for chest pain.     Potassium Chloride ER 20 MEQ Tbcr  Take 20 mEq by mouth daily.     tamsulosin 0.4 MG Caps capsule  Commonly known as:  FLOMAX  Take 0.4 mg by mouth at bedtime.        Disposition: Home Discharge Instructions    Diet - low sodium heart healthy    Complete by:  As directed      Increase activity slowly    Complete by:  As directed           Follow-up Information    Follow up with [facility] On 08/10/2015.   Specialty:  Cardiology   Why:  1:30 PM   Contact information:   [address] [id number] [place] [phone]      Follow up with [facility] On 09/08/2015.   Specialty:  Cardiology   Why:  10:00AM   Contact information:   [address] [id number] [place] [phone]      Follow up with [name], MD On 08/03/2015.   Specialty:  Family Medicine   Why:  call to make a follow up appointment for 1 week   Contact information:   [address] [place] [address] [phone]       Duration of Discharge Encounter: Greater than 30 minutes including physician time.  Signed, Tommye Standard, PA-C 08/04/2015 10:52 AM    I have seen, examined the patient, and reviewed the above assessment and plan.  On exam, RRR.  Changes to above are made where necessary.    Co Sign: [name], MD 08/04/2015

## 2015-08-03 NOTE — Evaluation (Signed)
Physical Therapy Evaluation Patient Details Name: Jerry [name] MRN: [id number] DOB: [birth year] Today's Date: 08/03/2015   History of Present Illness  Pt is a 82 y/o M admitted on 07/28/15 for Tikosyn loading, dyspnea, and a-fib. On 2/22 pt w/ respiratory distress, wheezing, fever, CXR w/ multifocal infiltrates from PNA.  Pt's PMH includes prostate cancer, MI, chronic back pain, TKA.  Clinical Impression  Pt admitted with above diagnosis. Pt currently with functional limitations due to the deficits listed below (see PT Problem List). Jerry [name] presents w/ generalized weakness, SOB w/ SpO2 dropping to 80% on RA while ambulating.  He requires min guard to safety ambulate in hallway. His wife will be with him at home but is unable to provide physical assist pt currently requires.  Pt will benefit from skilled PT to increase their independence and safety with mobility to allow discharge to the venue listed below.      Follow Up Recommendations SNF;Supervision for mobility/OOB    Equipment Recommendations  None recommended by PT    Recommendations for Other Services OT consult     Precautions / Restrictions Precautions Precautions: Fall Precaution Comments: watch O2 Restrictions Weight Bearing Restrictions: No      Mobility  Bed Mobility Overal bed mobility: Needs Assistance Bed Mobility: Supine to Sit     Supine to sit: Min guard;HOB elevated     General bed mobility comments: HOB elevated and use of bed rail.  Increased time.  Transfers Overall transfer level: Needs assistance Equipment used: Rolling walker (2 wheeled) Transfers: Sit to/from Stand Sit to Stand: Min assist         General transfer comment: Assist to boost up to standing following cues for proper hand placement and technique using RW.  Cues to stand upright.  Ambulation/Gait Ambulation/Gait assistance: Min guard Ambulation Distance (Feet): 50 Feet Assistive device: Rolling walker (2  wheeled) Gait Pattern/deviations: Decreased stride length;Antalgic;Trunk flexed   Gait velocity interpretation: <1.8 ft/sec, indicative of risk for recurrent falls General Gait Details: Dec gait speed w/ flexed posture, pt requires multiple verbal cues to stand upright and look ahead.  SpO2 down to 81% on RA while ambulating and pt requires ~2 minutes on 1L O2 for SpO2 to reach 94% while resting.  Stairs            Wheelchair Mobility    Modified Rankin (Stroke Patients Only)       Balance Overall balance assessment: Needs assistance;History of Falls (Fell when picking object off floor, required stitches) Sitting-balance support: Bilateral upper extremity supported;Feet supported Sitting balance-Leahy Scale: Good     Standing balance support: Bilateral upper extremity supported;During functional activity Standing balance-Leahy Scale: Poor Standing balance comment: Relies on RW for support to steady                             Pertinent Vitals/Pain Pain Assessment: No/denies pain    Home Living Family/patient expects to be discharged to:: Skilled nursing facility Living Arrangements: Spouse/significant other Available Help at Discharge: Family;Available 24 hours/day (from wife who will be unable to assist physically) Type of Home: House Home Access: Stairs to enter Entrance Stairs-Rails: None Entrance Stairs-Number of Steps: 2 Home Layout: Two level;Bed/bath upstairs Home Equipment: Walker - 2 wheels;Cane - single point      Prior Function Level of Independence: Independent with assistive device(s)         Comments: Used cane when outside of home  Hand Dominance        Extremity/Trunk Assessment   Upper Extremity Assessment: Overall WFL for tasks assessed           Lower Extremity Assessment: Generalized weakness      Cervical / Trunk Assessment: Kyphotic  Communication   Communication: No difficulties  Cognition  Arousal/Alertness: Awake/alert Behavior During Therapy: WFL for tasks assessed/performed Overall Cognitive Status: Within Functional Limits for tasks assessed                      General Comments General comments (skin integrity, edema, etc.): See ambulation details for SpO2.  All other VSS.    Exercises General Exercises - Lower Extremity Ankle Circles/Pumps: AROM;Both;10 reps;Seated Long Arc Quad: AROM;Both;10 reps;Seated Hip Flexion/Marching: AROM;Both;10 reps;Seated      Assessment/Plan    PT Assessment Patient needs continued PT services  PT Diagnosis Difficulty walking;Generalized weakness   PT Problem List Decreased strength;Decreased activity tolerance;Decreased balance;Decreased mobility;Decreased knowledge of use of DME;Decreased safety awareness;Cardiopulmonary status limiting activity  PT Treatment Interventions DME instruction;Gait training;Stair training;Functional mobility training;Therapeutic activities;Therapeutic exercise;Balance training;Patient/family education   PT Goals (Current goals can be found in the Care Plan section) Acute Rehab PT Goals Patient Stated Goal: to get stronger before returning home PT Goal Formulation: With patient Time For Goal Achievement: 08/17/15 Potential to Achieve Goals: Good    Frequency Min 3X/week   Barriers to discharge Inaccessible home environment flight to get to his bedroom.  Wife unable to provide physical assist    Co-evaluation               End of Session Equipment Utilized During Treatment: Gait belt;Oxygen Activity Tolerance: Patient limited by fatigue;Treatment limited secondary to medical complications (Comment) (SpO2) Patient left: in chair;with call bell/phone within reach;with chair alarm set Nurse Communication: Mobility status;Other (comment) (SpO2)         Time: [id number] PT Time Calculation (min) (ACUTE ONLY): 27 min   Charges:   PT Evaluation $PT Eval Moderate Complexity: 1  Procedure PT Treatments $Gait Training: 8-22 mins   PT G CodesJoslyn [name] PT, DPT [phone] Pager: [phone] 08/03/2015, 11:57 AM

## 2015-08-03 NOTE — Telephone Encounter (Signed)
Left message to call back  

## 2015-08-03 NOTE — Progress Notes (Signed)
SUBJECTIVE: The patient is improved today, c/w a slight cough but better.  OOB to chair for lunch feeling OK, was SOB on RA today when walking with PT, NO CP or palpitations.    CURRENT MEDICATIONS: . apixaban  5 mg Oral BID  . atorvastatin  40 mg Oral QHS  . cefTRIAXone (ROCEPHIN)  IV  2 g Intravenous Q24H  . cholecalciferol  1,000 Units Oral QHS  . diltiazem  240 mg Oral Daily  . dofetilide  500 mcg Oral BID  . losartan  100 mg Oral Daily  . metoprolol succinate  25 mg Oral Daily  . mirabegron ER  25 mg Oral QHS  . pantoprazole  40 mg Oral Daily  . potassium chloride  20 mEq Oral Daily  . psyllium  1 packet Oral Daily  . sodium chloride flush  3 mL Intravenous Q12H  . sodium chloride flush  3 mL Intravenous Q12H  . tamsulosin  0.4 mg Oral QHS   . sodium chloride      OBJECTIVE: Physical Exam: Filed Vitals:   08/03/15 0816 08/03/15 0857 08/03/15 0944 08/03/15 0945  BP:   125/71 125/71  Pulse:    68  Temp:      TempSrc:      Resp:      Height:      Weight:      SpO2: 96% 97%      Intake/Output Summary (Last 24 hours) at 08/03/15 1254 Last data filed at 08/03/15 1100  Gross per 24 hour  Intake    893 ml  Output   1025 ml  Net   -132 ml    Telemetry SR 60's  GEN- The patient is elderly, obese WM in NAD, alert and oriented x 3 today.   Head- normocephalic, atraumatic Eyes-  Sclera clear, conjunctiva pink Ears- hearing intact Oropharynx- clear Neck- supple Lungs- normal work of breathing, soft scattered rhonchi, no wheezes Heart- RRR, 1/6 SM, no gallops or rubs GI- soft, NT, ND, + BS Extremities- no clubbing, cyanosis, or edema Skin- no rash or lesion Psych- euthymic mood, full affect Neuro- strength and sensation are intact  LABS: Basic Metabolic Panel:  Recent Labs  08/02/15 0729 08/03/15 0444  NA 136 133*  K 4.7 4.5  CL 103 100*  CO2 24 24  GLUCOSE 151* 118*  BUN 27* 28*  CREATININE 0.96 0.83  CALCIUM 8.9 8.5*  MG 2.1 2.0     ASSESSMENT AND PLAN:  Principal Problem:   Persistent atrial fibrillation (HCC) Active Problems:   Fever   Acute respiratory failure (HCC)   Pneumonia due to organism   1. Persistent atrial fibrillation The patient has symptomatic persistent atrial fibrillation and has failed DCCV off of AAD therapy.  He presented for Tikosyn initiation, protocol completed QTc, K, Mg remained stable  Continue Eliquis for CHADS2VASC of 4 He remains in SR s/p DCCV yesterday  2. Acute hypoxic respiratory failure/ pneumonia Improving.   Appreciate pulmonary assistance.   Afebrile Continue antibiotics, steroids, and supportive care.  Discussed with Dr. Elsworth Soho, if he continues to need O2 recommended he be kept another day, , plan for Augmentin 875 BID when discharged (not Levaquin) The patient's O2 sat dropped to 81% on RA with ambulation and c/o SOB Will hold his discharge for today Patient and wife are made aware  3. HTN Stable No change required today  4. CAD      No CP  5. Functional decline during hospitalization  PT recommends with today's eval SNF rehab and OT eval, will ask OT to see the patient     Re-evaluate tomorrow    Tommye Standard, PA-C 08/03/2015 12:54 PM    I have seen, examined the patient, and reviewed the above assessment and plan.  On exam, RRR.  Breathing is much improved. Changes to above are made where necessary.  Plans for SNF tomorrow noted.   Co Sign: [name], MD 08/03/2015 1:35 PM

## 2015-08-03 NOTE — Progress Notes (Signed)
PCCM Progress Note  Subjective: Afebrile, breathing ok Still has cough.   Vital signs: BP 137/87 mmHg  Pulse 68  Temp(Src) 97.8 F (36.6 C) (Oral)  Resp 20  Ht 5\' 10"  (1.778 m)  Wt 98.476 kg (217 lb 1.6 oz)  BMI 31.15 kg/m2  SpO2 97%  On 2lpm NP  Intake/outpt: I/O last 3 completed shifts: In: 740 [P.O.:690; IV Piggyback:50] Out: 1325 [Urine:1325]  General: pleasant, sitting up in bed no increased wob  HEENT: no jvd , no sinus tenderness Cardiac: irregular Chest: scattered insp and exp rhonchi Abd: soft, non tender Ext: no edema Neuro: normal strength Skin: no rashes  CMP Latest Ref Rng 08/03/2015 08/02/2015 08/01/2015  Glucose 65 - 99 mg/dL 118(H) 151(H) 214(H)  BUN 6 - 20 mg/dL 28(H) 27(H) 28(H)  Creatinine 0.61 - 1.24 mg/dL 0.83 0.96 0.99  Sodium 135 - 145 mmol/L 133(L) 136 133(L)  Potassium 3.5 - 5.1 mmol/L 4.5 4.7 4.7  Chloride 101 - 111 mmol/L 100(L) 103 99(L)  CO2 22 - 32 mmol/L 24 24 24   Calcium 8.9 - 10.3 mg/dL 8.5(L) 8.9 8.5(L)    CBC Latest Ref Rng 07/31/2015 07/29/2015 06/09/2015  WBC 4.0 - 10.5 K/uL 19.0(H) 8.5 8.3  Hemoglobin 13.0 - 17.0 g/dL 8.6(L) 9.8(L) 10.1(L)  Hematocrit 39.0 - 52.0 % 27.4(L) 31.5(L) 30.5(L)  Platelets 150 - 400 K/uL 234 186 300    Dg Chest 2 View  08/03/2015  CLINICAL DATA:  Weakness. EXAM: CHEST  2 VIEW COMPARISON:  07/31/2015 . FINDINGS: Mediastinum and hilar structures normal. Right upper lobe atelectasis and/or infiltrate again noted. Bilateral lower lobe atelectasis and/or infiltrates noted. No pleural effusion or pneumothorax. Cardiomegaly. Normal pulmonary vascularity. Stable lower thoracic upper lumbar spine compression fracture. Degenerative changes thoracic spine . IMPRESSION: 1. Right upper lobe and left lower lobe atelectasis and/or infiltrates. 2.  Stable cardiomegaly. Electronically Signed   By: Marcello Moores  Register   On: 08/03/2015 07:46   I personally reviewed his imaging studies and compared to old films   Significant  events: 2/21 admitted for Tikosyn loading 2/22 pt with resp distress, wheeze, fever. CXR with multifocal infiltrates. PCCM consulted.   Cultures: Blood 2/22 >>  Antibiotics: Rocephin 2/22 >> Vancomycin 2/22 >> 2/25  Discussion: 82 yo male presented with dyspnea in setting of a fib.  He developed fever 101F, respiratory distress and b/l pulmonary infiltrates from PNA and defervesced nicely on empirical rx  Assessment/plan:  Pneumonia. - day 5 of Abx >>  Vanc  d/c'd 1/25  - f/u CXR intermittently as outpt -can change to levaquin x 5 more days - total 10 on dc  Acute hypoxic respiratory failure. - check satn on RA & walk - would be endpoint for discharge  Wheezing improved. - continue pulmicort, atrovent, xopenex prn only - should not need on discharge - off steroids  A fib. - s/p cardioversion>maintaining nSR    Kara Mead MD. FCCP. [facility] Pager [phone] If no response call [phone]

## 2015-08-04 DIAGNOSIS — Z5189 Encounter for other specified aftercare: Secondary | ICD-10-CM | POA: Diagnosis not present

## 2015-08-04 DIAGNOSIS — E876 Hypokalemia: Secondary | ICD-10-CM | POA: Diagnosis not present

## 2015-08-04 DIAGNOSIS — N3281 Overactive bladder: Secondary | ICD-10-CM | POA: Diagnosis not present

## 2015-08-04 DIAGNOSIS — I10 Essential (primary) hypertension: Secondary | ICD-10-CM | POA: Diagnosis not present

## 2015-08-04 DIAGNOSIS — Z9861 Coronary angioplasty status: Secondary | ICD-10-CM | POA: Diagnosis not present

## 2015-08-04 DIAGNOSIS — K219 Gastro-esophageal reflux disease without esophagitis: Secondary | ICD-10-CM | POA: Diagnosis not present

## 2015-08-04 DIAGNOSIS — G8929 Other chronic pain: Secondary | ICD-10-CM | POA: Diagnosis not present

## 2015-08-04 DIAGNOSIS — Z87891 Personal history of nicotine dependence: Secondary | ICD-10-CM | POA: Diagnosis not present

## 2015-08-04 DIAGNOSIS — M549 Dorsalgia, unspecified: Secondary | ICD-10-CM | POA: Diagnosis not present

## 2015-08-04 DIAGNOSIS — Z7902 Long term (current) use of antithrombotics/antiplatelets: Secondary | ICD-10-CM | POA: Diagnosis not present

## 2015-08-04 DIAGNOSIS — I480 Paroxysmal atrial fibrillation: Secondary | ICD-10-CM | POA: Diagnosis not present

## 2015-08-04 DIAGNOSIS — I2510 Atherosclerotic heart disease of native coronary artery without angina pectoris: Secondary | ICD-10-CM | POA: Diagnosis not present

## 2015-08-04 DIAGNOSIS — I4891 Unspecified atrial fibrillation: Secondary | ICD-10-CM | POA: Diagnosis not present

## 2015-08-04 DIAGNOSIS — E669 Obesity, unspecified: Secondary | ICD-10-CM | POA: Diagnosis not present

## 2015-08-04 DIAGNOSIS — F419 Anxiety disorder, unspecified: Secondary | ICD-10-CM | POA: Diagnosis not present

## 2015-08-04 DIAGNOSIS — Z8546 Personal history of malignant neoplasm of prostate: Secondary | ICD-10-CM | POA: Diagnosis not present

## 2015-08-04 DIAGNOSIS — J189 Pneumonia, unspecified organism: Secondary | ICD-10-CM | POA: Diagnosis not present

## 2015-08-04 DIAGNOSIS — I481 Persistent atrial fibrillation: Secondary | ICD-10-CM | POA: Diagnosis not present

## 2015-08-04 DIAGNOSIS — R2681 Unsteadiness on feet: Secondary | ICD-10-CM | POA: Diagnosis not present

## 2015-08-04 DIAGNOSIS — Z79899 Other long term (current) drug therapy: Secondary | ICD-10-CM | POA: Diagnosis not present

## 2015-08-04 DIAGNOSIS — M6281 Muscle weakness (generalized): Secondary | ICD-10-CM | POA: Diagnosis not present

## 2015-08-04 DIAGNOSIS — E785 Hyperlipidemia, unspecified: Secondary | ICD-10-CM | POA: Diagnosis not present

## 2015-08-04 DIAGNOSIS — Z885 Allergy status to narcotic agent status: Secondary | ICD-10-CM | POA: Diagnosis not present

## 2015-08-04 DIAGNOSIS — G4733 Obstructive sleep apnea (adult) (pediatric): Secondary | ICD-10-CM | POA: Diagnosis not present

## 2015-08-04 DIAGNOSIS — Z6831 Body mass index (BMI) 31.0-31.9, adult: Secondary | ICD-10-CM | POA: Diagnosis not present

## 2015-08-04 DIAGNOSIS — D649 Anemia, unspecified: Secondary | ICD-10-CM | POA: Diagnosis not present

## 2015-08-04 DIAGNOSIS — J9601 Acute respiratory failure with hypoxia: Secondary | ICD-10-CM | POA: Diagnosis not present

## 2015-08-04 DIAGNOSIS — D72829 Elevated white blood cell count, unspecified: Secondary | ICD-10-CM | POA: Diagnosis not present

## 2015-08-04 DIAGNOSIS — I482 Chronic atrial fibrillation: Secondary | ICD-10-CM | POA: Diagnosis not present

## 2015-08-04 DIAGNOSIS — R21 Rash and other nonspecific skin eruption: Secondary | ICD-10-CM | POA: Diagnosis not present

## 2015-08-04 DIAGNOSIS — R5381 Other malaise: Secondary | ICD-10-CM | POA: Diagnosis not present

## 2015-08-04 DIAGNOSIS — Z955 Presence of coronary angioplasty implant and graft: Secondary | ICD-10-CM | POA: Diagnosis not present

## 2015-08-04 MED ORDER — DOFETILIDE 500 MCG PO CAPS: 500.0000 ug | ORAL_CAPSULE | Freq: Two times a day (BID) | ORAL | Status: DC

## 2015-08-04 MED ORDER — AMOXICILLIN-POT CLAVULANATE 875-125 MG PO TABS: 1.0000 | ORAL_TABLET | Freq: Two times a day (BID) | ORAL | Status: DC

## 2015-08-04 NOTE — Clinical Social Work Note (Signed)
Clinical Social Work Assessment  Patient Details  Name: Jerry [name] MRN: [id number] Date of Birth: [birth year]  Date of referral:  08/04/15               Reason for consult:  Discharge Planning, Facility Placement                Permission sought to share information with:  Facility Sport and exercise psychologist, Family Supports Permission granted to share information::  Yes, Verbal Permission Granted  Name::     Materials engineer::  SNF  Relationship::     Contact Information:     Housing/Transportation Living arrangements for the past 2 months:  [place] of Information:  Patient Patient Interpreter Needed:  None Criminal Activity/Legal Involvement Pertinent to Current Situation/Hospitalization:  No - Comment as needed Significant Relationships:  Spouse Lives with:  Spouse Do you feel safe going back to the place where you live?  Yes Need for family participation in patient care:  Yes (Comment)  Care giving concerns:  Patient and wife both agree that the patient could benefit from short term rehab before returning home.   Social Worker assessment / plan:  CSW met with patient and patient's wife at bedside on 2/27. Patient and wife both agreeable to SNF placement at discharge and specifically request placement at [facility]. [facility] states that they will be able to accept the patient at discharge. Paperwork has been completed with facility. CSW has confirmed with facility that the patient's evening Tikosyn dose will be available for patient this evening. CSW will assist with DC when appropriate.  Employment status:  Retired Forensic scientist:  Medicare PT Recommendations:  [place] / Referral to community resources:  [place]  Patient/Family's Response to care:  Patient and patient's wife state they are happy with the care the patient has received and are appreciative of CSW assistance.  Patient/Family's  Understanding of and Emotional Response to Diagnosis, Current Treatment, and Prognosis:  Patient and patient's wife appear to have a good understanding of the reason for the patient's admission and have a clear understanding of the patient's post DC needs.  Emotional Assessment Appearance:  Appears stated age Attitude/Demeanor/Rapport:  Other (Patient and wife appropriate and welcoming of CSW.) Affect (typically observed):  Accepting, Calm, Appropriate, Pleasant Orientation:  Oriented to Self, Oriented to Place, Oriented to  Time, Oriented to Situation Alcohol / Substance use:  Never Used Psych involvement (Current and /or in the community):  No (Comment)  Discharge Needs  Concerns to be addressed:  Discharge Planning Concerns Readmission within the last 30 days:  No Current discharge risk:  Physical Impairment, Chronically ill Barriers to Discharge:  Continued Medical Work up   [employer] MSW, [place], [place], [phone]

## 2015-08-04 NOTE — Progress Notes (Signed)
PCCM Progress Note  Subjective:  Afebrile, breathing ok on RA Still has cough.   Vital signs: BP 112/59 mmHg  Pulse 71  Temp(Src) 98 F (36.7 C) (Oral)  Resp 21  Ht 5\' 10"  (1.778 m)  Wt 214 lb 12.8 oz (97.433 kg)  BMI 30.82 kg/m2  SpO2 95%    Intake/outpt: I/O last 3 completed shifts: In: 663 [P.O.:660; I.V.:3] Out: 2900 [Urine:2900]  General: pleasant, sitting up in bed no increased wob  HEENT: no jvd , no sinus tenderness Cardiac: irregular Chest: clear Abd: soft, non tender Ext: no edema Neuro: normal strength Skin: no rashes  CMP Latest Ref Rng 08/03/2015 08/02/2015 08/01/2015  Glucose 65 - 99 mg/dL 118(H) 151(H) 214(H)  BUN 6 - 20 mg/dL 28(H) 27(H) 28(H)  Creatinine 0.61 - 1.24 mg/dL 0.83 0.96 0.99  Sodium 135 - 145 mmol/L 133(L) 136 133(L)  Potassium 3.5 - 5.1 mmol/L 4.5 4.7 4.7  Chloride 101 - 111 mmol/L 100(L) 103 99(L)  CO2 22 - 32 mmol/L 24 24 24   Calcium 8.9 - 10.3 mg/dL 8.5(L) 8.9 8.5(L)    CBC Latest Ref Rng 07/31/2015 07/29/2015 06/09/2015  WBC 4.0 - 10.5 K/uL 19.0(H) 8.5 8.3  Hemoglobin 13.0 - 17.0 g/dL 8.6(L) 9.8(L) 10.1(L)  Hematocrit 39.0 - 52.0 % 27.4(L) 31.5(L) 30.5(L)  Platelets 150 - 400 K/uL 234 186 300    Dg Chest 2 View  08/03/2015  CLINICAL DATA:  Weakness. EXAM: CHEST  2 VIEW COMPARISON:  07/31/2015 . FINDINGS: Mediastinum and hilar structures normal. Right upper lobe atelectasis and/or infiltrate again noted. Bilateral lower lobe atelectasis and/or infiltrates noted. No pleural effusion or pneumothorax. Cardiomegaly. Normal pulmonary vascularity. Stable lower thoracic upper lumbar spine compression fracture. Degenerative changes thoracic spine . IMPRESSION: 1. Right upper lobe and left lower lobe atelectasis and/or infiltrates. 2.  Stable cardiomegaly. Electronically Signed   By: Marcello Moores  Register   On: 08/03/2015 07:46   I personally reviewed his imaging studies and compared to old films   Significant events: 2/21 admitted for Tikosyn  loading 2/22 pt with resp distress, wheeze, fever. CXR with multifocal infiltrates. PCCM consulted.   Cultures: Blood 2/22 >>ng  Antibiotics: Rocephin 2/22 >> Vancomycin 2/22 >> 2/25  Discussion: 82 yo male presented with dyspnea in setting of a fib.  He developed fever 101F, respiratory distress and b/l pulmonary infiltrates from PNA and defervesced nicely on empirical rx  Assessment/plan:  Pneumonia. -- f/u CXR as outpt -can change to augmentin  x 4 more days - total 10 on dc  Acute hypoxic respiratory failure - dropped satn on walk - OK for SNF r discharge  Wheezing improved. - off steroids  A fib. - s/p cardioversion>maintaining nSR    Kara Mead MD. FCCP. [facility] Pager [phone] If no response call [phone]

## 2015-08-04 NOTE — Care Management Important Message (Signed)
Important Message  Patient Details  Name: Jerry [name] MRN: [id number] Date of Birth: 1929/10/04   Medicare Important Message Given:  Yes    Nathen May 08/04/2015, 3:12 PM

## 2015-08-04 NOTE — Anesthesia Postprocedure Evaluation (Signed)
Anesthesia Post Note  Patient: Jerry [name]  Procedure(s) Performed: Procedure(s) (LRB): CARDIOVERSION (N/A)  Patient location during evaluation: PACU Anesthesia Type: MAC Level of consciousness: awake and alert Pain management: pain level controlled Vital Signs Assessment: post-procedure vital signs reviewed and stable Respiratory status: spontaneous breathing, nonlabored ventilation, respiratory function stable and patient connected to nasal cannula oxygen Cardiovascular status: stable and blood pressure returned to baseline Anesthetic complications: no    Last Vitals:  Filed Vitals:   08/03/15 2100 08/04/15 0500  BP: 120/68 121/64  Pulse: 63 64  Temp: 36.7 C 36.7 C  Resp: 20 21    Last Pain:  Filed Vitals:   08/04/15 0846  PainSc: 0-No pain                 Cala Kruckenberg,JAMES TERRILL

## 2015-08-04 NOTE — Progress Notes (Signed)
Pt is maintaining O2 sat between 89-95% at rest on room air. Will continue to monitor.  Ferdinand Lango, RN

## 2015-08-04 NOTE — Care Management Important Message (Signed)
Important Message  Patient Details  Name: Jerry [name] MRN: [id number] Date of Birth: [birth year]   Medicare Important Message Given:  Yes    Amed Datta Abena 08/04/2015, 3:10 PM

## 2015-08-04 NOTE — Clinical Social Work Placement (Signed)
   CLINICAL SOCIAL WORK PLACEMENT  NOTE  Date:  08/04/2015  Patient Details  Name: Jerry [name] MRN: [id number] Date of Birth: [birth year]  Clinical Social Work is seeking post-discharge placement for this patient at the [place] level of care (*CSW will initial, date and re-position this form in  chart as items are completed):  Yes   Patient/family provided with [place] Work Department's list of facilities offering this level of care within the geographic area requested by the patient (or if unable, by the patient's family).  Yes   Patient/family informed of their freedom to choose among providers that offer the needed level of care, that participate in Medicare, Medicaid or managed care program needed by the patient, have an available bed and are willing to accept the patient.  Yes   Patient/family informed of [facility]'s ownership interest in [facility] and [facility], as well as of the fact that they are under no obligation to receive care at these facilities.  PASRR submitted to EDS on       PASRR number received on       Existing PASRR number confirmed on 08/03/15     FL2 transmitted to all facilities in geographic area requested by pt/family on 08/03/15     FL2 transmitted to all facilities within larger geographic area on       Patient informed that his/her managed care company has contracts with or will negotiate with certain facilities, including the following:        Yes   Patient/family informed of bed offers received.  Patient [facility] bed at [facility]     Physician recommends and patient chooses bed at      Patient to be transferred to [facility] on 08/04/15.  Patient to be transferred to facility by Ambulance     Patient family notified on 08/04/15 of transfer.  Name of family member notified:  [name]     PHYSICIAN Please sign FL2, Please prepare prescriptions, Please prepare priority discharge  summary, including medications     Additional Comment:  Per MD patient ready for DC to [facility]. RN, patient, patient's family, and facility notified of DC. RN given number for report. DC packet on chart. Ambulance transport requested for patient for 12PM per RN's request. CSW has confirmed with facility that the facility will have patient's evening Tikosyn dose for this evening. CSW signing off.   _______________________________________________ Rigoberto Noel, LCSW 08/04/2015, 11:42 AM

## 2015-08-04 NOTE — Discharge Instructions (Signed)
It is important you follow up with your primary doctor as well, for next week to follow up with CXR.  Their office is aware of your need to have a follow up appointment for next week, if you do not hear from them, please call to follow up and be seen.   You have an appointment set up with the [facility].  Multiple studies have shown that being followed by a dedicated atrial fibrillation clinic in addition to the standard care you receive from your other physicians improves health. We believe that enrollment in the atrial fibrillation clinic will allow [place] to better care for you.   The phone number to the [facility] is [phone]. The clinic is staffed Monday through Friday from 8:30am to 5pm.  Parking Directions: The clinic is located in the Heart and Vascular Building connected to [facility]. 1)From [address] turn on to [address] and go to the 3rd entrance  (Heart and Vascular entrance) on the right. 2)Look to the right for Heart &Vascular Parking Garage. 3)A code for the entrance for March is 0010 and for April is 2000   4)Take the elevators to the 1st floor. Registration is in the room with the glass walls at the end of the hallway.  If you have any trouble parking or locating the clinic, please dont hesitate to call [phone].

## 2015-08-05 ENCOUNTER — Telehealth: Payer: Self-pay | Admitting: Cardiology

## 2015-08-05 ENCOUNTER — Non-Acute Institutional Stay (SKILLED_NURSING_FACILITY): Payer: Medicare Other | Admitting: Internal Medicine

## 2015-08-05 ENCOUNTER — Telehealth (HOSPITAL_COMMUNITY): Payer: Self-pay | Admitting: *Deleted

## 2015-08-05 ENCOUNTER — Encounter: Payer: Self-pay | Admitting: Internal Medicine

## 2015-08-05 DIAGNOSIS — Z9861 Coronary angioplasty status: Secondary | ICD-10-CM

## 2015-08-05 DIAGNOSIS — N3281 Overactive bladder: Secondary | ICD-10-CM | POA: Diagnosis not present

## 2015-08-05 DIAGNOSIS — I10 Essential (primary) hypertension: Secondary | ICD-10-CM | POA: Diagnosis not present

## 2015-08-05 DIAGNOSIS — J189 Pneumonia, unspecified organism: Secondary | ICD-10-CM | POA: Diagnosis not present

## 2015-08-05 DIAGNOSIS — R21 Rash and other nonspecific skin eruption: Secondary | ICD-10-CM | POA: Diagnosis not present

## 2015-08-05 DIAGNOSIS — I2510 Atherosclerotic heart disease of native coronary artery without angina pectoris: Secondary | ICD-10-CM

## 2015-08-05 DIAGNOSIS — I481 Persistent atrial fibrillation: Secondary | ICD-10-CM | POA: Diagnosis not present

## 2015-08-05 DIAGNOSIS — I4819 Other persistent atrial fibrillation: Secondary | ICD-10-CM

## 2015-08-05 DIAGNOSIS — D72829 Elevated white blood cell count, unspecified: Secondary | ICD-10-CM

## 2015-08-05 DIAGNOSIS — D649 Anemia, unspecified: Secondary | ICD-10-CM

## 2015-08-05 DIAGNOSIS — E785 Hyperlipidemia, unspecified: Secondary | ICD-10-CM | POA: Diagnosis not present

## 2015-08-05 DIAGNOSIS — R5381 Other malaise: Secondary | ICD-10-CM

## 2015-08-05 DIAGNOSIS — K219 Gastro-esophageal reflux disease without esophagitis: Secondary | ICD-10-CM | POA: Diagnosis not present

## 2015-08-05 NOTE — Progress Notes (Signed)
Patient ID: Jerry [name], male   DOB: 09-22-1929, 82 y.o.   MRN: [id number]    LOCATION: [facility]  PCP: [name], MD   Code Status: Full Code  Goals of care: Advanced Directive information Advanced Directives 07/29/2015  Does patient have an advance directive? No  Would patient like information on creating an advanced directive? No - patient declined information    Extended Emergency Contact Information Primary Emergency Contact: [facility] Address: [address]          [place], [place] [address] [name] of [place] Phone: [phone] Mobile Phone: [phone] Relation: Spouse   Allergies  Allergen Reactions  . Crab [Shellfish Allergy] Rash  . Hydrocodone Hives  . Adhesive [Tape] Rash  . Oxycodone Rash    Chief Complaint  Patient presents with  . New Admit To SNF    New Admission     HPI:  Patient is a 82 y.o. male seen today for short term rehabilitation post hospital admission from 07/28/15-08/04/15 elective tikosyn loading for his afib. He was started on loading dose of tikosyn. He had dyspnea and was diagnosed to have pneumonia. He was treated with BiPAP, antibiotics, steroid. On 08/01/25, he underwent direct cardioversion. He is seen in his room today with his wife at bedside. Per nursing, he did not receive his evening dosing of tikosyn yesterday and has not received it this am due to unavailability with the facility pharmacy. He denies any chest pain or palpitation at present. He complaints of rash with itching to his back.   Review of Systems:  Constitutional: Negative for fever, chills  HENT: Negative for headache, congestion, nasal discharge Eyes: Negative for blurred vision, double vision and discharge.  Respiratory: Negative for cough, shortness of breath and wheezing.   Cardiovascular: Negative for chest pain, palpitations, leg swelling.  Gastrointestinal: Negative for heartburn, nausea, vomiting, abdominal pain. Genitourinary:  Negative for dysuria. Musculoskeletal: Negative for fall Neurological: Negative for dizziness Psychiatric/Behavioral: Negative for depression   Past Medical History  Diagnosis Date  . Hypertension     Labile -- takes Verapamil,Losartan,and Metoprolol daily   . Peripheral edema     takes HCTZ daily  . Hyperlipidemia     takes Lipitor nightly  . CAD S/P percutaneous coronary angioplasty 1996, 2009, 01/2010    PTCA of L Cx 1996; BMS-RCA [facility]) 2009; Staged PCI RCA (70% ISR) --> 3.0 mm x 23 mm BMS, staged PCI Diag - 2.0 mm x 12 mm Mini Vision BMS (2.25 mm)  . Myocardial infarction [facility]) 1996, 2009    x 2;last time was about 8-6yrs ago   . Osteoarthritis of both knees   . Chronic back pain     scoliosis--pt states unable to have surgery bc of age  . GERD (gastroesophageal reflux disease)     takes Nexium daily  . History of colon polyps   . Urinary frequency     takes Vesicare and Flomax daily  . Urinary urgency   . Prostate cancer ([place]) 2005    seed implant  . Cataracts, bilateral     immature  . Anxiety     takes Valium prn  . Bleeding nose     occasionally  . Obesity (BMI 30.0-34.9)     Last BMI ~31;   . OSA (obstructive sleep apnea)   . Heart palpitations     PACs, short bursts of PSVT   Past Surgical History  Procedure Laterality Date  . Right knee arthrsoscopy  79yrs ago  .  Cyst removed  in college  . Coronary angioplasty with stent placement  1996, 2009, August 2011    PTCA of L Cx 1996; BMS-RCA [facility]) 2009; Staged PCI RCA (70% ISR) --> 3.0 mm x 23 mm BMS, staged PCI Diag - 2.0 mm x 12 mm Mini Vision BMS (2.25 mm)  . Wisdom teeth extraccted    . Colonoscopy    . Seeds placed into prostate   2005  . Cholecystectomy    . Neuroplasty / transposition median nerve at carpal tunnel bilateral    . Right trigger finger    . Total knee arthroplasty  05/09/2012    Procedure: TOTAL KNEE ARTHROPLASTY;  Surgeon: Kerin Salen, MD;  Location: [place];  Service: Orthopedics;   Laterality: Right;  . Transthoracic echocardiogram  09/26/2012    EF 55-60%. Grade 2 diastolic dysfunction with moderate concentric LVH. Aortic sclerosis.  [name] Nm myoview ltd  November 2013    EF 53%; fixed mid inferior-inferolateral defect, infarct with no ischemia.  [name] Nm myoview ltd  11/24/2010    ejection fraction 58%,left ventricle is normal size ,no evidence of inducible myocardial ischemia  . Cardioversion N/A 06/12/2015    Procedure: CARDIOVERSION;  Surgeon: Sanda Klein, MD;  [facility];  Service: Cardiovascular;  Laterality: N/A;  . Tee without cardioversion N/A 06/12/2015    Procedure: TRANSESOPHAGEAL ECHOCARDIOGRAM (TEE);  Surgeon: Sanda Klein, MD;  Location: [place];  Service: Cardiovascular;  Laterality: N/A;  . Cardioversion N/A 08/02/2015    Procedure: CARDIOVERSION;  Surgeon: [name], MD;  Location: [facility] OR;  Service: Cardiovascular;  Laterality: N/A;   Social History:   reports that he quit smoking about 22 years ago. His smoking use included Pipe. He does not have any smokeless tobacco history on file. He reports that he drinks alcohol. He reports that he does not use illicit drugs.  History reviewed. No pertinent family history.  Medications:   Medication List       This list is accurate as of: 08/05/15  2:38 PM.  Always use your most recent med list.               acetaminophen 500 MG tablet  Commonly known as:  TYLENOL  Take 1,000 mg by mouth daily as needed (pain).     amoxicillin 500 MG capsule  Commonly known as:  AMOXIL  Take 500 mg by mouth. Take 4 capsules ( 2000 mg) by mouth one hour before dental appointment     amoxicillin-clavulanate 875-125 MG tablet  Commonly known as:  AUGMENTIN  Take 1 tablet by mouth daily. Stop date 08/08/15     apixaban 5 MG Tabs tablet  Commonly known as:  ELIQUIS  Take 1 tablet (5 mg total) by mouth 2 (two) times daily.     atorvastatin 40 MG tablet  Commonly known as:  LIPITOR  Take 40 mg by mouth  at bedtime.     cholecalciferol 1000 units tablet  Commonly known as:  VITAMIN D  Take 1,000 Units by mouth at bedtime.     diazepam 2 MG tablet  Commonly known as:  VALIUM  Take 2 mg by mouth daily as needed for anxiety.     diltiazem 240 MG 24 hr capsule  Commonly known as:  CARDIZEM CD  Take 1 capsule (240 mg total) by mouth daily.     dofetilide 500 MCG capsule  Commonly known as:  TIKOSYN  Take 1 capsule (500 mcg total) by mouth 2 (two) times  daily.     esomeprazole 40 MG capsule  Commonly known as:  NEXIUM  Take 1 capsule (40 mg total) by mouth daily.     hydrocortisone cream 1 %  Apply 1 application topically daily as needed for itching.     losartan 100 MG tablet  Commonly known as:  COZAAR  Take 1 tablet (100 mg total) by mouth daily.     Magnesium 200 MG Tabs  Take 1 tablet (200 mg total) by mouth 2 (two) times daily.     magnesium hydroxide 400 MG/5ML suspension  Commonly known as:  MILK OF MAGNESIA  Take 30 mLs by mouth daily as needed for mild constipation.     metoprolol succinate 25 MG 24 hr tablet  Commonly known as:  TOPROL-XL  Take 1 tablet (25 mg total) by mouth daily.     MIRALAX PO  Take 17 g by mouth daily as needed (Constipation).     MYRBETRIQ 25 MG Tb24 tablet  Generic drug:  mirabegron ER  Take 25 mg by mouth daily.     nitroGLYCERIN 0.4 MG SL tablet  Commonly known as:  NITROSTAT  Place 0.4 mg under the tongue every 5 (five) minutes as needed for chest pain.     Potassium Chloride ER 20 MEQ Tbcr  Take 20 mEq by mouth daily.     tamsulosin 0.4 MG Caps capsule  Commonly known as:  FLOMAX  Take 0.4 mg by mouth at bedtime.        Immunizations: Immunization History  Administered Date(s) Administered  . Influenza-Unspecified 03/06/2013, 02/12/2014  . Pneumococcal Polysaccharide-23 02/04/2013, 02/12/2014     Physical Exam: Filed Vitals:   08/05/15 1423  BP: 121/71  Pulse: 73  Temp: 98.6 F (37 C)  TempSrc: Oral  Resp:  20  Height: 5\' 10"  (1.778 m)  Weight: 218 lb 3.2 oz (98.975 kg)  SpO2: 93%   Body mass index is 31.31 kg/(m^2).  General- elderly obese male, in no acute distress Head- normocephalic, atraumatic Nose- no maxillary or frontal sinus tenderness, no nasal discharge Throat- moist mucus membrane Eyes- PERRLA, EOMI, no pallor, no icterus, no discharge Neck- no cervical lymphadenopathy Cardiovascular- irregular heart rate, trace leg edema Respiratory- bilateral clear to auscultation Abdomen- bowel sounds present, soft, non tender Musculoskeletal- able to move all 4 extremities, generalized weakness Neurological- alert and oriented to person, place and time Skin- warm and dry, erythematous petechial rash scattered on his back, no drainage or signs of infection noted Psychiatry- normal mood and affect    Labs reviewed: Basic Metabolic Panel:  Recent Labs  07/31/15 0440 08/01/15 1040 08/02/15 0729 08/03/15 08/03/15 0444  NA 134* 133* 136 133* 133*  K 4.5 4.7 4.7  --  4.5  CL 100* 99* 103  --  100*  CO2 22 24 24   --  24  GLUCOSE 154* 214* 151*  --  118*  BUN 27* 28* 27* 28* 28*  CREATININE 0.86 0.99 0.96 0.8 0.83  CALCIUM 8.8* 8.5* 8.9  --  8.5*  MG 1.9 2.1 2.1  --  2.0  PHOS 4.0  --   --   --   --    Liver Function Tests:  Recent Labs  07/16/15 1000  AST 16  ALT 7*  ALKPHOS 58  BILITOT 0.7  PROT 6.6  ALBUMIN 3.4*   No results for input(s): LIPASE, AMYLASE in the last 8760 hours. No results for input(s): AMMONIA in the last 8760 hours. CBC:  Recent Labs  06/09/15 1628 07/29/15 0817 07/31/15 07/31/15 0440  WBC 8.3 8.5 19.0 19.0*  NEUTROABS 5.6  --   --   --   HGB 10.1* 9.8*  --  8.6*  HCT 30.5* 31.5*  --  27.4*  MCV 77.6* 81.0  --  80.6  PLT 300 186  --  234   Cardiac Enzymes: No results for input(s): CKTOTAL, CKMB, CKMBINDEX, TROPONINI in the last 8760 hours. BNP: Invalid input(s): POCBNP CBG: No results for input(s): GLUCAP in the last 8760  hours.  Radiological Exams: Dg Chest 2 View  08/03/2015  CLINICAL DATA:  Weakness. EXAM: CHEST  2 VIEW COMPARISON:  07/31/2015 . FINDINGS: Mediastinum and hilar structures normal. Right upper lobe atelectasis and/or infiltrate again noted. Bilateral lower lobe atelectasis and/or infiltrates noted. No pleural effusion or pneumothorax. Cardiomegaly. Normal pulmonary vascularity. Stable lower thoracic upper lumbar spine compression fracture. Degenerative changes thoracic spine . IMPRESSION: 1. Right upper lobe and left lower lobe atelectasis and/or infiltrates. 2.  Stable cardiomegaly. Electronically Signed   By: Marcello Moores  Register   On: 08/03/2015 07:46   Dg Chest 2 View  07/29/2015  CLINICAL DATA:  Fever, wheezing, shortness of Breath EXAM: CHEST  2 VIEW COMPARISON:  04/30/2012 FINDINGS: Patchy airspace disease noted bilaterally in the upper lobes and lung bases. There is cardiomegaly. No visible effusions or acute bony abnormality. IMPRESSION: Cardiomegaly with patchy bilateral airspace disease, most confluent in the upper lobes. Findings concerning for multifocal pneumonia. Edema is possible but felt less likely. Electronically Signed   By: Rolm Baptise M.D.   On: 07/29/2015 09:29   Dg Chest Port 1 View  07/31/2015  CLINICAL DATA:  Respiratory failure, fever, atrial fibrillation. EXAM: PORTABLE CHEST 1 VIEW COMPARISON:  PA and lateral chest x-ray of July 29, 2015. FINDINGS: The lungs are well-expanded. Confluent alveolar opacity is present inferiorly in the right upper lobe and in the right infrahilar region. Patchy alveolar density in the left lower lobe is more distinct today. There is partial obscuration of the left hemidiaphragm which is not new. The cardiac silhouette is mildly enlarged. The pulmonary vascularity is not engorged. IMPRESSION: Bilateral pneumonia. Stable cardiomegaly without significant pulmonary vascular congestion. Electronically Signed   By: David  Martinique M.D.   On: 07/31/2015  08:05    Assessment/Plan  Physical deconditioning Will have him work with physical therapy and occupational therapy team to help with gait training and muscle strengthening exercises.fall precautions. Skin care. Encourage to be out of bed.   afib Had tikosyn loading dose and underwent direct cardioversion. Rate currently controlled. Continue eliquis for anticoagulation. Continue tikosyn- pharmacy has been able to send his dosing. Cardiology office has been informed about missed dosing and would like to resume tikosyn for now and get f/u ekg 08/07/15. Continue tikosyn 500 mg bid, cardizem 240 mg daily and toprol 25 mg daily  Pneumonia Continue and complete his augmentin on 08/08/15. Monitor clinically. Encourage to use incentive spirometer  Leukocytosis Infection, steroids could both be contributing. Check cbc  Back rash No signs of infection. Start hydrocortisone cream 1% bid x 1 week and skin care  HLD Continue lipitor  Anemia Monitor cbc  gerd Continue neixum 40 mg daily  HTN Stable, continue cozaar 100 mg daily and toprol xl 25 mg daily, monitor bp and HR bid for now  CAD Remains chest pain free, continue b blocker and ARB with statin and prn NTG for now  OAB With BPH. Continue flomax and myrbetriq for now  Goals of care: short term rehabilitation   Labs/tests ordered: cbc with diff, cmp 08/10/15  Family/ staff Communication: reviewed care plan with patient and nursing supervisor    Blanchie Serve, MD Internal Medicine [place], [place] [address] Cell Phone (Monday-Friday 8 am - 5 pm): [phone] On Call: [phone] and follow prompts after 5 pm and on weekends Office Phone: [phone] Office Fax: [phone]

## 2015-08-05 NOTE — Telephone Encounter (Signed)
Jerry [name] called in wanting to report a drug interaction amongst Tikoysn,HCTZ and Verapamil. She says there is protential for QT prolongation for the tikosyn. Please f/u with her  Thanks

## 2015-08-05 NOTE — Telephone Encounter (Signed)
Nurse, Kenney Houseman,  from nursing facility called stating they ran out of patient's tikosyn and he missed a dose last night and this morning but would be receiving shipment shortly. Discussed with Dr. Rayann Heman and instructed to resume at dose tonight at normal time. Reinforced importance of not missing doses of tikosyn and if should run out again to call beforehand and short supply could be given.  Facility will perform EKG on Friday morning and fax result to our office. Voiced understanding.

## 2015-08-05 NOTE — Telephone Encounter (Signed)
Notification given to pharmacy on med changes. They will fulfill the tikosyn for patient.

## 2015-08-05 NOTE — Telephone Encounter (Signed)
On 2/9 he was told to d/c hctz and verapamil was switched to diltiazem240 mg.

## 2015-08-10 ENCOUNTER — Ambulatory Visit (HOSPITAL_COMMUNITY)
Admit: 2015-08-10 | Discharge: 2015-08-10 | Disposition: A | Discharge status: Home / Self Care | Payer: No Typology Code available for payment source | Source: Ambulatory Visit | Attending: Nurse Practitioner | Admitting: Nurse Practitioner

## 2015-08-10 ENCOUNTER — Encounter (HOSPITAL_COMMUNITY): Payer: Self-pay | Admitting: Nurse Practitioner

## 2015-08-10 VITALS — BP 122/68 | HR 58 | Ht 70.0 in | Wt 219.0 lb

## 2015-08-10 DIAGNOSIS — I481 Persistent atrial fibrillation: Secondary | ICD-10-CM

## 2015-08-10 DIAGNOSIS — I4819 Other persistent atrial fibrillation: Secondary | ICD-10-CM

## 2015-08-10 DIAGNOSIS — Z87891 Personal history of nicotine dependence: Secondary | ICD-10-CM | POA: Insufficient documentation

## 2015-08-10 DIAGNOSIS — J189 Pneumonia, unspecified organism: Secondary | ICD-10-CM | POA: Insufficient documentation

## 2015-08-10 DIAGNOSIS — Z885 Allergy status to narcotic agent status: Secondary | ICD-10-CM | POA: Insufficient documentation

## 2015-08-10 DIAGNOSIS — I10 Essential (primary) hypertension: Secondary | ICD-10-CM | POA: Insufficient documentation

## 2015-08-10 DIAGNOSIS — M549 Dorsalgia, unspecified: Secondary | ICD-10-CM | POA: Insufficient documentation

## 2015-08-10 DIAGNOSIS — Z6831 Body mass index (BMI) 31.0-31.9, adult: Secondary | ICD-10-CM | POA: Insufficient documentation

## 2015-08-10 DIAGNOSIS — Z7902 Long term (current) use of antithrombotics/antiplatelets: Secondary | ICD-10-CM | POA: Diagnosis not present

## 2015-08-10 DIAGNOSIS — I4891 Unspecified atrial fibrillation: Secondary | ICD-10-CM | POA: Diagnosis not present

## 2015-08-10 DIAGNOSIS — I2510 Atherosclerotic heart disease of native coronary artery without angina pectoris: Secondary | ICD-10-CM | POA: Insufficient documentation

## 2015-08-10 DIAGNOSIS — Z955 Presence of coronary angioplasty implant and graft: Secondary | ICD-10-CM | POA: Insufficient documentation

## 2015-08-10 DIAGNOSIS — Z79899 Other long term (current) drug therapy: Secondary | ICD-10-CM | POA: Insufficient documentation

## 2015-08-10 DIAGNOSIS — Z8546 Personal history of malignant neoplasm of prostate: Secondary | ICD-10-CM | POA: Insufficient documentation

## 2015-08-10 DIAGNOSIS — E669 Obesity, unspecified: Secondary | ICD-10-CM | POA: Insufficient documentation

## 2015-08-10 DIAGNOSIS — G8929 Other chronic pain: Secondary | ICD-10-CM | POA: Insufficient documentation

## 2015-08-10 DIAGNOSIS — E785 Hyperlipidemia, unspecified: Secondary | ICD-10-CM | POA: Insufficient documentation

## 2015-08-10 DIAGNOSIS — G4733 Obstructive sleep apnea (adult) (pediatric): Secondary | ICD-10-CM | POA: Insufficient documentation

## 2015-08-10 DIAGNOSIS — K219 Gastro-esophageal reflux disease without esophagitis: Secondary | ICD-10-CM | POA: Insufficient documentation

## 2015-08-10 DIAGNOSIS — F419 Anxiety disorder, unspecified: Secondary | ICD-10-CM | POA: Insufficient documentation

## 2015-08-10 LAB — HEPATIC FUNCTION PANEL
ALT: 9 U/L — AB (ref 10–40)
AST: 11 U/L — AB (ref 14–40)
Alkaline Phosphatase: 71 U/L (ref 25–125)
Bilirubin, Total: 0.5 mg/dL

## 2015-08-10 LAB — BASIC METABOLIC PANEL
ANION GAP: 8 (ref 5–15)
BUN: 19 mg/dL (ref 4–21)
BUN: 18 mg/dL (ref 6–20)
CHLORIDE: 104 mmol/L (ref 101–111)
CO2: 25 mmol/L (ref 22–32)
CREATININE: 1.01 mg/dL (ref 0.61–1.24)
Calcium: 8.8 mg/dL — ABNORMAL LOW (ref 8.9–10.3)
Creatinine: 1 mg/dL (ref 0.6–1.3)
GFR calc non Af Amer: >60 mL/min (ref >60)
GLUCOSE: 127 mg/dL
Glucose, Bld: 149 mg/dL — ABNORMAL HIGH (ref 65–99)
POTASSIUM: 4.1 mmol/L (ref 3.4–5.3)
Potassium: 4.5 mmol/L (ref 3.5–5.1)
SODIUM: 139 mmol/L (ref 137–147)
SODIUM: 137 mmol/L (ref 135–145)

## 2015-08-10 LAB — CBC AND DIFFERENTIAL
HCT: 31 % — AB (ref 41–53)
Hemoglobin: 9.1 g/dL — AB (ref 13.5–17.5)
Neutrophils Absolute: 7 /uL
Platelets: 331 10*3/uL (ref 150–399)
WBC: 9.1 10^3/mL

## 2015-08-10 LAB — MAGNESIUM: MAGNESIUM: 1.7 mg/dL (ref 1.7–2.4)

## 2015-08-10 NOTE — Progress Notes (Signed)
Patient ID: Jerry [name], male   DOB: 09-Jan-1930, 82 y.o.   MRN: [id number]     Primary Care Physician: [name], MD Referring Physician: Dr. Rayann Heman, [facility] f/u   Jerry [name] is a 82 y.o. male with a h/o afib symptomatic with fatigue and shortness of breath, whose chose Tikosyn therapy to restore sinus rhythm. The patient was admitted and Tikosyn was initiated. Renal function and electrolytes were followed during the hospitalization. Their QTc remained stable. He developed fever, SOB/wheezing was seen by pulmonary medicine treated with BIPAP, antibiotics, pulmocort, medrol for pneumonia. He has remained afebrile for days, pulmonary service saw the patient, I spoke with Dr. Elsworth Soho, Augmentin 875mg  BID for 4 more days out patient, and out patient f/u CXR. He did desaturate yesterday with ambulation on RA and was held for another day of IV abx and monitoring. PT saw and evaluated the patient and recommended SNF for rehab, arrangements have been made with Case management.   He is in the afib clinic today for f/u and with sbrady at 58 bpm with stable QTC at 459 ms. The nursing facility did not give him tikosyn x 2 doses last week as they did not have in facility, even though they confirmed at d/c from hospital that they did have drug. Dr.Allred gave ok to resume drug. He feels he is recovering from pneumonia but is still too early to tell if he feels improved in SR. He states he had a soft slide out of the w/c this am and bumped his head but staff was there and assessed him and there is no bruising evident this pm. NO LOC.  Today, he denies symptoms of palpitations, chest pain, shortness of breath, orthopnea, PND, lower extremity edema, dizziness, presyncope, syncope, or neurologic sequela. The patient is tolerating medications without difficulties and is otherwise without complaint today.   Past Medical History  Diagnosis Date  . Hypertension     Labile -- takes Verapamil,Losartan,and  Metoprolol daily   . Peripheral edema     takes HCTZ daily  . Hyperlipidemia     takes Lipitor nightly  . CAD S/P percutaneous coronary angioplasty 1996, 2009, 01/2010    PTCA of L Cx 1996; BMS-RCA [facility]) 2009; Staged PCI RCA (70% ISR) --> 3.0 mm x 23 mm BMS, staged PCI Diag - 2.0 mm x 12 mm Mini Vision BMS (2.25 mm)  . Myocardial infarction [facility]) 1996, 2009    x 2;last time was about 8-90yrs ago   . Osteoarthritis of both knees   . Chronic back pain     scoliosis--pt states unable to have surgery bc of age  . GERD (gastroesophageal reflux disease)     takes Nexium daily  . History of colon polyps   . Urinary frequency     takes Vesicare and Flomax daily  . Urinary urgency   . Prostate cancer ([place]) 2005    seed implant  . Cataracts, bilateral     immature  . Anxiety     takes Valium prn  . Bleeding nose     occasionally  . Obesity (BMI 30.0-34.9)     Last BMI ~31;   . OSA (obstructive sleep apnea)   . Heart palpitations     PACs, short bursts of PSVT   Past Surgical History  Procedure Laterality Date  . Right knee arthrsoscopy  20yrs ago  . Cyst removed  in college  . Coronary angioplasty with stent placement  1996, 2009, August 2011  PTCA of L Cx 1996; BMS-RCA [facility]) 2009; Staged PCI RCA (70% ISR) --> 3.0 mm x 23 mm BMS, staged PCI Diag - 2.0 mm x 12 mm Mini Vision BMS (2.25 mm)  . Wisdom teeth extraccted    . Colonoscopy    . Seeds placed into prostate   2005  . Cholecystectomy    . Neuroplasty / transposition median nerve at carpal tunnel bilateral    . Right trigger finger    . Total knee arthroplasty  05/09/2012    Procedure: TOTAL KNEE ARTHROPLASTY;  Surgeon: Kerin Salen, MD;  Location: [place];  Service: Orthopedics;  Laterality: Right;  . Transthoracic echocardiogram  09/26/2012    EF 55-60%. Grade 2 diastolic dysfunction with moderate concentric LVH. Aortic sclerosis.  [name] Nm myoview ltd  November 2013    EF 53%; fixed mid inferior-inferolateral defect,  infarct with no ischemia.  [name] Nm myoview ltd  11/24/2010    ejection fraction 58%,left ventricle is normal size ,no evidence of inducible myocardial ischemia  . Cardioversion N/A 06/12/2015    Procedure: CARDIOVERSION;  Surgeon: Sanda Klein, MD;  [facility];  Service: Cardiovascular;  Laterality: N/A;  . Tee without cardioversion N/A 06/12/2015    Procedure: TRANSESOPHAGEAL ECHOCARDIOGRAM (TEE);  Surgeon: Sanda Klein, MD;  Location: [facility] ENDOSCOPY;  Service: Cardiovascular;  Laterality: N/A;  . Cardioversion N/A 08/02/2015    Procedure: CARDIOVERSION;  Surgeon: [name], MD;  Location: [facility] OR;  Service: Cardiovascular;  Laterality: N/A;    Current Outpatient Prescriptions  Medication Sig Dispense Refill  . acetaminophen (TYLENOL) 500 MG tablet Take 1,000 mg by mouth daily as needed (pain).    [name] amoxicillin (AMOXIL) 500 MG capsule Take 500 mg by mouth. Take 4 capsules ( 2000 mg) by mouth one hour before dental appointment    . amoxicillin-clavulanate (AUGMENTIN) 875-125 MG tablet Take 1 tablet by mouth daily. Stop date 08/08/15    . apixaban (ELIQUIS) 5 MG TABS tablet Take 1 tablet (5 mg total) by mouth 2 (two) times daily. 90 tablet 1  . atorvastatin (LIPITOR) 40 MG tablet Take 40 mg by mouth at bedtime.     . cholecalciferol (VITAMIN D) 1000 UNITS tablet Take 1,000 Units by mouth at bedtime.     . diazepam (VALIUM) 2 MG tablet Take 2 mg by mouth daily as needed for anxiety.     [name] diltiazem (CARDIZEM CD) 240 MG 24 hr capsule Take 1 capsule (240 mg total) by mouth daily. 30 capsule 3  . dofetilide (TIKOSYN) 500 MCG capsule Take 1 capsule (500 mcg total) by mouth 2 (two) times daily. 180 capsule 3  . esomeprazole (NEXIUM) 40 MG capsule Take 1 capsule (40 mg total) by mouth daily. 90 capsule 3  . hydrocortisone cream 1 % Apply 1 application topically daily as needed for itching.     . losartan (COZAAR) 100 MG tablet Take 1 tablet (100 mg total) by mouth daily. 90 tablet 1  .  Magnesium 200 MG TABS Take 1 tablet (200 mg total) by mouth 2 (two) times daily. (Patient taking differently: Take 200 mg by mouth daily at 12 noon. ) 60 each   . magnesium hydroxide (MILK OF MAGNESIA) 400 MG/5ML suspension Take 30 mLs by mouth daily as needed for mild constipation.    . metoprolol succinate (TOPROL-XL) 25 MG 24 hr tablet Take 1 tablet (25 mg total) by mouth daily. 90 tablet 3  . mirabegron ER (MYRBETRIQ) 25 MG TB24 tablet Take 25 mg by  mouth daily.     . nitroGLYCERIN (NITROSTAT) 0.4 MG SL tablet Place 0.4 mg under the tongue every 5 (five) minutes as needed for chest pain.     . Polyethylene Glycol 3350 (MIRALAX PO) Take 17 g by mouth daily as needed (Constipation).     . potassium chloride 20 MEQ TBCR Take 20 mEq by mouth daily. 30 tablet 3  . tamsulosin (FLOMAX) 0.4 MG CAPS capsule Take 0.4 mg by mouth at bedtime.      No current facility-administered medications for this encounter.    Allergies  Allergen Reactions  . Crab [Shellfish Allergy] Rash  . Hydrocodone Hives  . Adhesive [Tape] Rash  . Oxycodone Rash    Social History   Social History  . Marital Status: Married    Spouse Name: N/A  . Number of Children: N/A  . Years of Education: N/A   Occupational History  . Retired    Social History Main Topics  . Smoking status: Former Smoker    Types: Pipe    Quit date: 03/12/1993  . Smokeless tobacco: Not on file     Comment: quit smoking pipe about 45yrs ago  . Alcohol Use: Yes     Comment: glass of wine daily  . Drug Use: No  . Sexual Activity: Not Currently   Other Topics Concern  . Not on file   Social History Narrative   He is a married father of 2, grandfather of 2. He exercises at least 3 to 4 times a week but does something 6 to 7 days a week. He does not smoke. Drinks occasional alcohol.     No family history on file.  ROS- All systems are reviewed and negative except as per the HPI above  Physical Exam: Filed Vitals:   08/10/15 1333    BP: 122/68  Pulse: 58  Height: 5\' 10"  (1.778 m)  Weight: 219 lb (99.338 kg)    GEN- The patient is well appearing, alert and oriented x 3 today.   Head- normocephalic, atraumatic Eyes-  Sclera clear, conjunctiva pink Ears- hearing intact Oropharynx- clear Neck- supple, no JVP Lymph- no cervical lymphadenopathy Lungs- Clear to ausculation bilaterally, normal work of breathing Heart- Regular rate and rhythm, no murmurs, rubs or gallops, PMI not laterally displaced GI- soft, NT, ND, + BS Extremities- no clubbing, cyanosis, or edema MS- no significant deformity or atrophy Skin- no rash or lesion Psych- euthymic mood, full affect Neuro- strength and sensation are intact  EKG- Sinus brady at 58 bpm, pr int 192 ms, qrs int 86 ms, qtc 459 ms (stable) Epic records reviewed    Assessment and Plan: 1. afib In SR after loading of tikosyn 500 mg bid Continue drug Had cbc, cmet at facility recently but results aren't back when we inquired,  so will draw K+, mag today. Continue apixaban  2. Pneumonia Treatment per SNF and he states has f/u with his pcp as well coming up. He is feeling better  F/u in afib clinic 4/4 F/u with [name] 4/20  Geroge Baseman. Keshaun Dubey, [facility] [address] [place], [place] [address] [phone]

## 2015-08-11 ENCOUNTER — Non-Acute Institutional Stay (SKILLED_NURSING_FACILITY): Payer: Medicare Other | Admitting: Adult Health

## 2015-08-11 ENCOUNTER — Telehealth: Payer: Self-pay | Admitting: Cardiology

## 2015-08-11 ENCOUNTER — Encounter: Payer: Self-pay | Admitting: Adult Health

## 2015-08-11 DIAGNOSIS — D649 Anemia, unspecified: Secondary | ICD-10-CM | POA: Diagnosis not present

## 2015-08-11 NOTE — Telephone Encounter (Signed)
2nd attempt  Spoke to Santiago Glad who states this was taken care of, was a question about having pt go on iron supplements which was handled by PCP office.

## 2015-08-11 NOTE — Progress Notes (Signed)
Patient ID: Jerry [name], male   DOB: Feb 27, 1930, 82 y.o.   MRN: [id number]    DATE:  08/11/2015   MRN:  [id number]  BIRTHDAY: 08/29/1929  Facility:  [facility] Location:  [place] Room Number: 307-P  LEVEL OF CARE:  SNF [phone])  Contact Information    Name Relation Home Work Mobile   Walker Spouse [phone]  [phone]   Janey Genta Daughter [phone]  [phone]   [place]lin, Vogler [id number]  [phone]       Code Status History    Date Active Date Inactive Code Status Order ID Comments User Context   07/28/2015  6:09 PM 08/04/2015  3:47 PM Full Code [id number]  Patsey Berthold, NP Inpatient   05/09/2012  6:34 PM 05/12/2012  4:10 PM Full Code [id number]  Cyndra Numbers, RN Inpatient       Chief Complaint  Patient presents with  . Acute Visit    Anemia    HISTORY OF PRESENT ILLNESS:   This is an 82 year old male who is being seen for an acute visit. Lab review showed hgb 9.1 . He was admitted to [facility] on 08/04/15 from [facility]. He was hospitalized for elective Tikosyn loading for his Atrial fibrillation. He had dyspnea and was diagnosed with pneumonia. He was treated with BiPAP, antibiotics and steroid. On 08/02/15, he had direct cardioversion.   He has been admitted for a short-term rehabilitation.  PAST MEDICAL HISTORY:  Past Medical History  Diagnosis Date  . Hypertension     Labile -- takes Verapamil,Losartan,and Metoprolol daily   . Peripheral edema     takes HCTZ daily  . Hyperlipidemia     takes Lipitor nightly  . CAD S/P percutaneous coronary angioplasty 1996, 2009, 01/2010    PTCA of L Cx 1996; BMS-RCA [facility]) 2009; Staged PCI RCA (70% ISR) --> 3.0 mm x 23 mm BMS, staged PCI Diag - 2.0 mm x 12 mm Mini Vision BMS (2.25 mm)  . Myocardial infarction [facility]) 1996, 2009    x 2;last time was about 8-57yrs ago   . Osteoarthritis of both knees   . Chronic back pain     scoliosis--pt  states unable to have surgery bc of age  . GERD (gastroesophageal reflux disease)     takes Nexium daily  . History of colon polyps   . Urinary frequency     takes Vesicare and Flomax daily  . Urinary urgency   . Prostate cancer ([place]) 2005    seed implant  . Cataracts, bilateral     immature  . Anxiety     takes Valium prn  . Bleeding nose     occasionally  . Obesity (BMI 30.0-34.9)     Last BMI ~31;   . OSA (obstructive sleep apnea)   . Heart palpitations     PACs, short bursts of PSVT  . Physical deconditioning   . Persistent atrial fibrillation ([place])   . Pneumonia due to organism   . Leukocytosis   . Anemia   . Rash of back   . OAB (overactive bladder)      CURRENT MEDICATIONS: Reviewed  Patient's Medications  New Prescriptions   No medications on file  Previous Medications   ACETAMINOPHEN (TYLENOL) 500 MG TABLET    Take 1,000 mg by mouth daily as needed (pain).   APIXABAN (ELIQUIS) 5 MG TABS TABLET    Take 1 tablet (5 mg total) by mouth 2 (two) times  daily.   ATORVASTATIN (LIPITOR) 40 MG TABLET    Take 40 mg by mouth at bedtime.    CHOLECALCIFEROL (D3 ADULT PO)    Take 1,000 Units by mouth daily.   DIAZEPAM (VALIUM) 2 MG TABLET    Take 2 mg by mouth daily as needed for anxiety.    DILTIAZEM (CARDIZEM CD) 240 MG 24 HR CAPSULE    Take 1 capsule (240 mg total) by mouth daily.   DOFETILIDE (TIKOSYN) 500 MCG CAPSULE    Take 1 capsule (500 mcg total) by mouth 2 (two) times daily.   HYDROCORTISONE CREAM 1 %    Apply 1 application topically daily as needed (rash). For one week   LOSARTAN (COZAAR) 100 MG TABLET    Take 1 tablet (100 mg total) by mouth daily.   MAGNESIUM 200 MG TABS    Take 1 tablet (200 mg total) by mouth 2 (two) times daily.   MAGNESIUM HYDROXIDE (MILK OF MAGNESIA) 400 MG/5ML SUSPENSION    Take 30 mLs by mouth daily as needed for mild constipation.   METOPROLOL SUCCINATE (TOPROL-XL) 25 MG 24 HR TABLET    Take 1 tablet (25 mg total) by mouth daily.    MIRABEGRON ER (MYRBETRIQ) 25 MG TB24 TABLET    Take 25 mg by mouth daily.    NITROGLYCERIN (NITROSTAT) 0.4 MG SL TABLET    Place 0.4 mg under the tongue every 5 (five) minutes as needed for chest pain.    OMEPRAZOLE (PRILOSEC) 20 MG CAPSULE    Take 20 mg by mouth daily.   POLYETHYLENE GLYCOL 3350 (MIRALAX PO)    Take 17 g by mouth daily as needed (Constipation).    POTASSIUM CHLORIDE 20 MEQ TBCR    Take 20 mEq by mouth daily.   TAMSULOSIN (FLOMAX) 0.4 MG CAPS CAPSULE    Take 0.4 mg by mouth at bedtime.   Modified Medications   No medications on file  Discontinued Medications   AMOXICILLIN (AMOXIL) 500 MG CAPSULE    Take 500 mg by mouth. Take 4 capsules ( 2000 mg) by mouth one hour before dental appointment   AMOXICILLIN-CLAVULANATE (AUGMENTIN) 875-125 MG TABLET    Take 1 tablet by mouth daily. Stop date 08/08/15   CHOLECALCIFEROL (VITAMIN D) 1000 UNITS TABLET    Take 1,000 Units by mouth at bedtime.    ESOMEPRAZOLE (NEXIUM) 40 MG CAPSULE    Take 1 capsule (40 mg total) by mouth daily.     Allergies  Allergen Reactions  . Crab [Shellfish Allergy] Rash  . Hydrocodone Hives  . Adhesive [Tape] Rash  . Oxycodone Rash     REVIEW OF SYSTEMS:  GENERAL: no change in appetite, no fatigue, no weight changes, no fever, chills or weakness SKIN: Denies rash, itching, wounds, ulcer sores, or nail abnormality EYES: Denies change in vision, dry eyes, eye pain, itching or discharge EARS: Denies change in hearing, ringing in ears, or earache NOSE: Denies nasal congestion or epistaxis MOUTH and THROAT: Denies oral discomfort, gingival pain or bleeding, pain from teeth or hoarseness   RESPIRATORY: no cough, SOB, DOE, wheezing, hemoptysis CARDIAC: no chest pain, edema or palpitations GI: no abdominal pain, diarrhea, constipation, heart burn, nausea or vomiting GU: Denies dysuria, frequency, hematuria, incontinence, or discharge PSYCHIATRIC: Denies feeling of depression or anxiety. No report of  hallucinations, insomnia, paranoia, or agitation   PHYSICAL EXAMINATION  GENERAL APPEARANCE: Well nourished. In no acute distress. Normal body habitus SKIN:  Skin is warm and dry.  HEAD: Normal  in size and contour. No evidence of trauma EYES: Lids open and close normally. No blepharitis, entropion or ectropion. PERRL. Conjunctivae are clear and sclerae are white. Lenses are without opacity EARS: Pinnae are normal. Patient hears normal voice tunes of the examiner MOUTH and THROAT: Lips are without lesions. Oral mucosa is moist and without lesions. Tongue is normal in shape, size, and color and without lesions NECK: supple, trachea midline, no neck masses, no thyroid tenderness, no thyromegaly LYMPHATICS: no LAN in the neck, no supraclavicular LAN RESPIRATORY: breathing is even & unlabored, BS CTAB CARDIAC: RRR, no murmur,no extra heart sounds, no edema GI: abdomen soft, normal BS, no masses, no tenderness, no hepatomegaly, no splenomegaly EXTREMITIES:  Able to move X 4 extremities PSYCHIATRIC: Alert and oriented X 3. Affect and behavior are appropriate    LABS/RADIOLOGY: Labs reviewed: Basic Metabolic Panel:  Recent Labs  07/31/15 0440  08/02/15 0729  08/03/15 0444 08/10/15 08/10/15 1417  NA 134*  < > 136  < > 133* 139 137  K 4.5  < > 4.7  --  4.5 4.1 4.5  CL 100*  < > 103  --  100*  --  104  CO2 22  < > 24  --  24  --  25  GLUCOSE 154*  < > 151*  --  118*  --  149*  BUN 27*  < > 27*  < > 28* 19 18  CREATININE 0.86  < > 0.96  < > 0.83 1.0 1.01  CALCIUM 8.8*  < > 8.9  --  8.5*  --  8.8*  MG 1.9  < > 2.1  --  2.0  --  1.7  PHOS 4.0  --   --   --   --   --   --   < > = values in this interval not displayed. Liver Function Tests:  Recent Labs  07/16/15 1000 08/10/15  AST 16 11*  ALT 7* 9*  ALKPHOS 58 71  BILITOT 0.7  --   PROT 6.6  --   ALBUMIN 3.4*  --    CBC:  Recent Labs  06/09/15 1628 07/29/15 0817 07/31/15 07/31/15 0440 08/10/15  WBC 8.3 8.5 19.0 19.0* 9.1   NEUTROABS 5.6  --   --   --  7  HGB 10.1* 9.8*  --  8.6* 9.1*  HCT 30.5* 31.5*  --  27.4* 31*  MCV 77.6* 81.0  --  80.6  --   PLT 300 186  --  234 331       Dg Chest 2 View  08/03/2015  CLINICAL DATA:  Weakness. EXAM: CHEST  2 VIEW COMPARISON:  07/31/2015 . FINDINGS: Mediastinum and hilar structures normal. Right upper lobe atelectasis and/or infiltrate again noted. Bilateral lower lobe atelectasis and/or infiltrates noted. No pleural effusion or pneumothorax. Cardiomegaly. Normal pulmonary vascularity. Stable lower thoracic upper lumbar spine compression fracture. Degenerative changes thoracic spine . IMPRESSION: 1. Right upper lobe and left lower lobe atelectasis and/or infiltrates. 2.  Stable cardiomegaly. Electronically Signed   By: Marcello Moores  Register   On: 08/03/2015 07:46   Dg Chest 2 View  07/29/2015  CLINICAL DATA:  Fever, wheezing, shortness of Breath EXAM: CHEST  2 VIEW COMPARISON:  04/30/2012 FINDINGS: Patchy airspace disease noted bilaterally in the upper lobes and lung bases. There is cardiomegaly. No visible effusions or acute bony abnormality. IMPRESSION: Cardiomegaly with patchy bilateral airspace disease, most confluent in the upper lobes. Findings concerning for multifocal pneumonia. Edema is  possible but felt less likely. Electronically Signed   By: Rolm Baptise M.D.   On: 07/29/2015 09:29   Dg Chest Port 1 View  07/31/2015  CLINICAL DATA:  Respiratory failure, fever, atrial fibrillation. EXAM: PORTABLE CHEST 1 VIEW COMPARISON:  PA and lateral chest x-ray of July 29, 2015. FINDINGS: The lungs are well-expanded. Confluent alveolar opacity is present inferiorly in the right upper lobe and in the right infrahilar region. Patchy alveolar density in the left lower lobe is more distinct today. There is partial obscuration of the left hemidiaphragm which is not new. The cardiac silhouette is mildly enlarged. The pulmonary vascularity is not engorged. IMPRESSION: Bilateral pneumonia.  Stable cardiomegaly without significant pulmonary vascular congestion. Electronically Signed   By: David  Martinique M.D.   On: 07/31/2015 08:05    ASSESSMENT/PLAN:  Anemia - start FeSO4 325 mg 1 tab PO Q D;  CBC in 1 week     [facility], NP [place]

## 2015-08-11 NOTE — Telephone Encounter (Signed)
Please call,concerning  His medicine.

## 2015-08-11 NOTE — Telephone Encounter (Signed)
Operator connected me to extension provided, rings w/ no answer when dialed.

## 2015-08-12 ENCOUNTER — Other Ambulatory Visit: Payer: Self-pay | Admitting: Cardiology

## 2015-08-12 NOTE — Telephone Encounter (Signed)
REFILL 

## 2015-08-17 LAB — CBC AND DIFFERENTIAL
HEMATOCRIT: 27 % — AB (ref 41–53)
Hemoglobin: 8.1 g/dL — AB (ref 13.5–17.5)
NEUTROS ABS: 5 /uL
PLATELETS: 213 10*3/uL (ref 150–399)
WBC: 7.4 10^3/mL

## 2015-08-19 ENCOUNTER — Telehealth (HOSPITAL_COMMUNITY): Payer: Self-pay | Admitting: *Deleted

## 2015-08-19 ENCOUNTER — Other Ambulatory Visit (HOSPITAL_COMMUNITY): Payer: Self-pay | Admitting: *Deleted

## 2015-08-19 ENCOUNTER — Non-Acute Institutional Stay (SKILLED_NURSING_FACILITY): Payer: Medicare Other | Admitting: Adult Health

## 2015-08-19 ENCOUNTER — Encounter: Payer: Self-pay | Admitting: Adult Health

## 2015-08-19 DIAGNOSIS — I4819 Other persistent atrial fibrillation: Secondary | ICD-10-CM

## 2015-08-19 DIAGNOSIS — D649 Anemia, unspecified: Secondary | ICD-10-CM

## 2015-08-19 DIAGNOSIS — Z9861 Coronary angioplasty status: Secondary | ICD-10-CM | POA: Diagnosis not present

## 2015-08-19 DIAGNOSIS — F419 Anxiety disorder, unspecified: Secondary | ICD-10-CM

## 2015-08-19 DIAGNOSIS — K219 Gastro-esophageal reflux disease without esophagitis: Secondary | ICD-10-CM

## 2015-08-19 DIAGNOSIS — I481 Persistent atrial fibrillation: Secondary | ICD-10-CM | POA: Diagnosis not present

## 2015-08-19 DIAGNOSIS — I10 Essential (primary) hypertension: Secondary | ICD-10-CM | POA: Diagnosis not present

## 2015-08-19 DIAGNOSIS — E876 Hypokalemia: Secondary | ICD-10-CM

## 2015-08-19 DIAGNOSIS — N3281 Overactive bladder: Secondary | ICD-10-CM

## 2015-08-19 DIAGNOSIS — E785 Hyperlipidemia, unspecified: Secondary | ICD-10-CM | POA: Diagnosis not present

## 2015-08-19 DIAGNOSIS — R5381 Other malaise: Secondary | ICD-10-CM | POA: Diagnosis not present

## 2015-08-19 DIAGNOSIS — I2510 Atherosclerotic heart disease of native coronary artery without angina pectoris: Secondary | ICD-10-CM | POA: Diagnosis not present

## 2015-08-19 MED ORDER — MAGNESIUM OXIDE 400 MG PO TABS: 400.0000 mg | ORAL_TABLET | Freq: Two times a day (BID) | ORAL | Status: DC

## 2015-08-19 NOTE — Telephone Encounter (Signed)
Patient had labs drawn at facility to recheck magnesium after dose increase.  Magnesium still low at 1.5 -- will increase magnesium dose to 400mg  BID and redraw in one week. RX sent to facility.

## 2015-08-19 NOTE — Progress Notes (Addendum)
Patient ID: Jerry [name], male   DOB: Mar 10, 1930, 82 y.o.   MRN: [id number]    DATE:  08/19/2015   MRN:  [id number]  BIRTHDAY: 29-Aug-1929  Facility:  Nursing Home Location:  [place] Room Number: 307-P  LEVEL OF CARE:  SNF [phone])  Contact Information    Name Relation Home Work Mobile   Sandoval Spouse [phone]  [phone]   Janey Genta Daughter [phone]  [phone]   Malvin, Selz [id number]  [phone]       Code Status History    Date Active Date Inactive Code Status Order ID Comments User Context   07/28/2015  6:09 PM 08/04/2015  3:47 PM Full Code [id number]  Patsey Berthold, NP Inpatient   05/09/2012  6:34 PM 05/12/2012  4:10 PM Full Code [id number]  Cyndra Numbers, RN Inpatient       Chief Complaint  Patient presents with  . Discharge Note    HISTORY OF PRESENT ILLNESS:   This is an 82 year old male who is for discharge home with Home health PT and OT. He was recently started on FeSO4 for anemia, hgb 9.1 . He was admitted to [facility] on 08/04/15 from [facility]. He was hospitalized for elective Tikosyn loading for his Atrial fibrillation. He had dyspnea and was diagnosed with pneumonia. He was treated with BiPAP, antibiotics and steroid. On 08/02/15, he had direct cardioversion.   Patient was admitted to this facility for short-term rehabilitation after the patient's recent hospitalization.  Patient has completed SNF rehabilitation and therapy has cleared the patient for discharge.  PAST MEDICAL HISTORY:  Past Medical History  Diagnosis Date  . Hypertension     Labile -- takes Verapamil,Losartan,and Metoprolol daily   . Peripheral edema     takes HCTZ daily  . Hyperlipidemia     takes Lipitor nightly  . CAD S/P percutaneous coronary angioplasty 1996, 2009, 01/2010    PTCA of L Cx 1996; BMS-RCA [facility]) 2009; Staged PCI RCA (70% ISR) --> 3.0 mm x 23 mm BMS, staged PCI Diag - 2.0 mm x 12 mm  Mini Vision BMS (2.25 mm)  . Myocardial infarction [facility]) 1996, 2009    x 2;last time was about 8-31yrs ago   . Osteoarthritis of both knees   . Chronic back pain     scoliosis--pt states unable to have surgery bc of age  . GERD (gastroesophageal reflux disease)     takes Nexium daily  . History of colon polyps   . Urinary frequency     takes Vesicare and Flomax daily  . Urinary urgency   . Prostate cancer ([place]) 2005    seed implant  . Cataracts, bilateral     immature  . Anxiety     takes Valium prn  . Bleeding nose     occasionally  . Obesity (BMI 30.0-34.9)     Last BMI ~31;   . OSA (obstructive sleep apnea)   . Heart palpitations     PACs, short bursts of PSVT  . Physical deconditioning   . Persistent atrial fibrillation ([place])   . Pneumonia due to organism   . Leukocytosis   . Anemia   . Rash of back   . OAB (overactive bladder)      CURRENT MEDICATIONS: Reviewed  Patient's Medications  New Prescriptions   No medications on file  Previous Medications   ACETAMINOPHEN (TYLENOL) 500 MG TABLET    Take 1,000 mg by mouth daily as  needed (pain).   APIXABAN (ELIQUIS) 5 MG TABS TABLET    Take 1 tablet (5 mg total) by mouth 2 (two) times daily.   ATORVASTATIN (LIPITOR) 40 MG TABLET    Take 40 mg by mouth at bedtime.    CHOLECALCIFEROL (D3 ADULT PO)    Take 1,000 Units by mouth at bedtime.    DIAZEPAM (VALIUM) 2 MG TABLET    Take 2 mg by mouth daily as needed for anxiety.    DILTIAZEM (CARDIZEM CD) 240 MG 24 HR CAPSULE    Take 1 capsule (240 mg total) by mouth daily.   DOFETILIDE (TIKOSYN) 500 MCG CAPSULE    Take 1 capsule (500 mcg total) by mouth 2 (two) times daily.   FERROUS SULFATE 325 (65 FE) MG TABLET    Take 325 mg by mouth daily with breakfast.   HYDROCORTISONE CREAM 1 %    Apply 1 application topically daily as needed (rash). For one week   LOSARTAN (COZAAR) 100 MG TABLET    Take 1 tablet (100 mg total) by mouth daily.   MAGNESIUM HYDROXIDE (MILK OF MAGNESIA) 400  MG/5ML SUSPENSION    Take 30 mLs by mouth daily as needed for mild constipation.   MAGNESIUM OXIDE (MAG-OX) 400 MG TABLET    Take 1 tablet (400 mg total) by mouth 2 (two) times daily.   METOPROLOL SUCCINATE (TOPROL-XL) 25 MG 24 HR TABLET    TAKE 1 TABLET DAILY   MIRABEGRON ER (MYRBETRIQ) 25 MG TB24 TABLET    Take 25 mg by mouth daily.    NITROGLYCERIN (NITROSTAT) 0.4 MG SL TABLET    Place 0.4 mg under the tongue every 5 (five) minutes as needed for chest pain.    OMEPRAZOLE (PRILOSEC) 20 MG CAPSULE    Take 20 mg by mouth daily.   POLYETHYLENE GLYCOL 3350 (MIRALAX PO)    Take 17 g by mouth daily as needed (Constipation).    POTASSIUM CHLORIDE 20 MEQ TBCR    Take 20 mEq by mouth daily.   TAMSULOSIN (FLOMAX) 0.4 MG CAPS CAPSULE    Take 0.4 mg by mouth at bedtime.   Modified Medications   No medications on file  Discontinued Medications   No medications on file     Allergies  Allergen Reactions  . Crab [Shellfish Allergy] Rash  . Hydrocodone Hives  . Adhesive [Tape] Rash  . Oxycodone Rash     REVIEW OF SYSTEMS:  GENERAL: no change in appetite, no fatigue, no weight changes, no fever, chills or weakness SKIN: Denies rash, itching, wounds, ulcer sores, or nail abnormality EYES: Denies change in vision, dry eyes, eye pain, itching or discharge EARS: Denies change in hearing, ringing in ears, or earache NOSE: Denies nasal congestion or epistaxis MOUTH and THROAT: Denies oral discomfort, gingival pain or bleeding, pain from teeth or hoarseness   RESPIRATORY: no cough, SOB, DOE, wheezing, hemoptysis CARDIAC: no chest pain or palpitations GI: no abdominal pain, diarrhea, constipation, heart burn, nausea or vomiting GU: Denies dysuria, frequency, hematuria, incontinence, or discharge PSYCHIATRIC: Denies feeling of depression or anxiety. No report of hallucinations, insomnia, paranoia, or agitation   PHYSICAL EXAMINATION  GENERAL APPEARANCE: Well nourished. In no acute distress. Normal  body habitus SKIN:  Skin is warm and dry.  HEAD: Normal in size and contour. No evidence of trauma EYES: Lids open and close normally. No blepharitis, entropion or ectropion. PERRL. Conjunctivae are clear and sclerae are white. Lenses are without opacity EARS: Pinnae are normal. Patient hears  normal voice tunes of the examiner MOUTH and THROAT: Lips are without lesions. Oral mucosa is moist and without lesions. Tongue is normal in shape, size, and color and without lesions NECK: supple, trachea midline, no neck masses, no thyroid tenderness, no thyromegaly LYMPHATICS: no LAN in the neck, no supraclavicular LAN RESPIRATORY: breathing is even & unlabored, BS CTAB CARDIAC: RRR, no murmur,no extra heart sounds, BLE trace edema GI: abdomen soft, normal BS, no masses, no tenderness, no hepatomegaly, no splenomegaly EXTREMITIES:  Able to move X 4 extremities PSYCHIATRIC: Alert and oriented X 3. Affect and behavior are appropriate    LABS/RADIOLOGY: Labs reviewed: 08/17/15  hemoglobin 8.1 hematocrit 27.0 MCV 82.3 CBC 7.4 platelet 213 magnesium 1.5 Basic Metabolic Panel:  Recent Labs  07/31/15 0440  08/02/15 0729  08/03/15 0444 08/10/15 08/10/15 1417  NA 134*  < > 136  < > 133* 139 137  K 4.5  < > 4.7  --  4.5 4.1 4.5  CL 100*  < > 103  --  100*  --  104  CO2 22  < > 24  --  24  --  25  GLUCOSE 154*  < > 151*  --  118*  --  149*  BUN 27*  < > 27*  < > 28* 19 18  CREATININE 0.86  < > 0.96  < > 0.83 1.0 1.01  CALCIUM 8.8*  < > 8.9  --  8.5*  --  8.8*  MG 1.9  < > 2.1  --  2.0  --  1.7  PHOS 4.0  --   --   --   --   --   --   < > = values in this interval not displayed. Liver Function Tests:  Recent Labs  07/16/15 1000 08/10/15  AST 16 11*  ALT 7* 9*  ALKPHOS 58 71  BILITOT 0.7  --   PROT 6.6  --   ALBUMIN 3.4*  --    CBC:  Recent Labs  06/09/15 1628 07/29/15 0817  07/31/15 0440 08/10/15 08/17/15 0700  WBC 8.3 8.5  < > 19.0* 9.1 7.4  NEUTROABS 5.6  --   --   --  7 5   HGB 10.1* 9.8*  --  8.6* 9.1* 8.1*  HCT 30.5* 31.5*  --  27.4* 31* 27*  MCV 77.6* 81.0  --  80.6  --   --   PLT 300 186  --  234 331 213  < > = values in this interval not displayed.     Dg Chest 2 View  08/03/2015  CLINICAL DATA:  Weakness. EXAM: CHEST  2 VIEW COMPARISON:  07/31/2015 . FINDINGS: Mediastinum and hilar structures normal. Right upper lobe atelectasis and/or infiltrate again noted. Bilateral lower lobe atelectasis and/or infiltrates noted. No pleural effusion or pneumothorax. Cardiomegaly. Normal pulmonary vascularity. Stable lower thoracic upper lumbar spine compression fracture. Degenerative changes thoracic spine . IMPRESSION: 1. Right upper lobe and left lower lobe atelectasis and/or infiltrates. 2.  Stable cardiomegaly. Electronically Signed   By: Marcello Moores  Register   On: 08/03/2015 07:46   Dg Chest 2 View  07/29/2015  CLINICAL DATA:  Fever, wheezing, shortness of Breath EXAM: CHEST  2 VIEW COMPARISON:  04/30/2012 FINDINGS: Patchy airspace disease noted bilaterally in the upper lobes and lung bases. There is cardiomegaly. No visible effusions or acute bony abnormality. IMPRESSION: Cardiomegaly with patchy bilateral airspace disease, most confluent in the upper lobes. Findings concerning for multifocal pneumonia. Edema is possible but  felt less likely. Electronically Signed   By: Rolm Baptise M.D.   On: 07/29/2015 09:29   Dg Chest Port 1 View  07/31/2015  CLINICAL DATA:  Respiratory failure, fever, atrial fibrillation. EXAM: PORTABLE CHEST 1 VIEW COMPARISON:  PA and lateral chest x-ray of July 29, 2015. FINDINGS: The lungs are well-expanded. Confluent alveolar opacity is present inferiorly in the right upper lobe and in the right infrahilar region. Patchy alveolar density in the left lower lobe is more distinct today. There is partial obscuration of the left hemidiaphragm which is not new. The cardiac silhouette is mildly enlarged. The pulmonary vascularity is not engorged.  IMPRESSION: Bilateral pneumonia. Stable cardiomegaly without significant pulmonary vascular congestion. Electronically Signed   By: David  Martinique M.D.   On: 07/31/2015 08:05    ASSESSMENT/PLAN:  Physical deconditioning - for home health PT and OT  Persistent atrial fibrillation - rate controlled; continue Tikosyn 500 g 1 capsule twice a day, prescription will be provided by cardiology, patient verbalized having medication on hand; continue Eliquis 5 mg 1 tab by mouth twice a day, Cardizem 240 mg daily and Toprol 25 mg daily; follow-up with cardiology  Hyperlipidemia - continue atorvastatin 40 mg 1 tab by mouth daily at bedtime  Anxiety - mood is stable; continue volume 2 mg 1 tab by mouth daily when necessary  OAB with BPH - continue Flomax 0.4 mg daily and myrbetriq 25 mg 1 tab by mouth daily  GERD - continue omeprazole 20 mg 1 capsule by mouth daily  Hypertension - well controlled; continue diltiazem 24 hours CD 240 mg 1 capsule by mouth daily, losartan 100 mg 1 tab by mouth daily and metoprolol succinate ER 25 mg 1 tab by mouth daily  CAD - stable; continue metoprolol, losartan and atorvastatin  Anemia - re-check hgb 8.1; increase FeSO4 325 mg 1 tab PO BID;  Check for stool occult blood X 3;CBC in 1 week thru PCP  Hypokalemia - K 4.1; on KCL ER 20 meq daily; will check BMP today  Hypomagnesemia - admission 1.5; continue magnesian 200 mg 1 tab by mouth twice a day     I have filled out patient's discharge paperwork and written prescriptions.  Patient will receive home health PT and OT.  Total discharge time: Greater than 30 minutes  Discharge time involved coordination of the discharge process with social worker, nursing staff and therapy department. Medical justification for home health services verified.   [facility], NP [employer] [phone]

## 2015-08-22 DIAGNOSIS — I2510 Atherosclerotic heart disease of native coronary artery without angina pectoris: Secondary | ICD-10-CM | POA: Diagnosis not present

## 2015-08-22 DIAGNOSIS — G4733 Obstructive sleep apnea (adult) (pediatric): Secondary | ICD-10-CM | POA: Diagnosis not present

## 2015-08-22 DIAGNOSIS — E669 Obesity, unspecified: Secondary | ICD-10-CM | POA: Diagnosis not present

## 2015-08-22 DIAGNOSIS — M170 Bilateral primary osteoarthritis of knee: Secondary | ICD-10-CM | POA: Diagnosis not present

## 2015-08-22 DIAGNOSIS — F419 Anxiety disorder, unspecified: Secondary | ICD-10-CM | POA: Diagnosis not present

## 2015-08-22 DIAGNOSIS — Z8701 Personal history of pneumonia (recurrent): Secondary | ICD-10-CM | POA: Diagnosis not present

## 2015-08-22 DIAGNOSIS — E785 Hyperlipidemia, unspecified: Secondary | ICD-10-CM | POA: Diagnosis not present

## 2015-08-22 DIAGNOSIS — I4891 Unspecified atrial fibrillation: Secondary | ICD-10-CM | POA: Diagnosis not present

## 2015-08-22 DIAGNOSIS — Z87891 Personal history of nicotine dependence: Secondary | ICD-10-CM | POA: Diagnosis not present

## 2015-08-22 DIAGNOSIS — D649 Anemia, unspecified: Secondary | ICD-10-CM | POA: Diagnosis not present

## 2015-08-22 DIAGNOSIS — K219 Gastro-esophageal reflux disease without esophagitis: Secondary | ICD-10-CM | POA: Diagnosis not present

## 2015-08-22 DIAGNOSIS — I10 Essential (primary) hypertension: Secondary | ICD-10-CM | POA: Diagnosis not present

## 2015-08-22 DIAGNOSIS — I252 Old myocardial infarction: Secondary | ICD-10-CM | POA: Diagnosis not present

## 2015-08-23 ENCOUNTER — Other Ambulatory Visit: Payer: Self-pay | Admitting: Cardiology

## 2015-08-24 NOTE — Telephone Encounter (Signed)
Rx request sent to pharmacy.  

## 2015-08-25 DIAGNOSIS — D649 Anemia, unspecified: Secondary | ICD-10-CM | POA: Diagnosis not present

## 2015-08-25 DIAGNOSIS — I2510 Atherosclerotic heart disease of native coronary artery without angina pectoris: Secondary | ICD-10-CM | POA: Diagnosis not present

## 2015-08-25 DIAGNOSIS — I10 Essential (primary) hypertension: Secondary | ICD-10-CM | POA: Diagnosis not present

## 2015-08-25 DIAGNOSIS — I4891 Unspecified atrial fibrillation: Secondary | ICD-10-CM | POA: Diagnosis not present

## 2015-08-25 DIAGNOSIS — G4733 Obstructive sleep apnea (adult) (pediatric): Secondary | ICD-10-CM | POA: Diagnosis not present

## 2015-08-25 DIAGNOSIS — M170 Bilateral primary osteoarthritis of knee: Secondary | ICD-10-CM | POA: Diagnosis not present

## 2015-08-26 DIAGNOSIS — M170 Bilateral primary osteoarthritis of knee: Secondary | ICD-10-CM | POA: Diagnosis not present

## 2015-08-26 DIAGNOSIS — I4891 Unspecified atrial fibrillation: Secondary | ICD-10-CM | POA: Diagnosis not present

## 2015-08-26 DIAGNOSIS — I2510 Atherosclerotic heart disease of native coronary artery without angina pectoris: Secondary | ICD-10-CM | POA: Diagnosis not present

## 2015-08-26 DIAGNOSIS — D649 Anemia, unspecified: Secondary | ICD-10-CM | POA: Diagnosis not present

## 2015-08-26 DIAGNOSIS — I10 Essential (primary) hypertension: Secondary | ICD-10-CM | POA: Diagnosis not present

## 2015-08-26 DIAGNOSIS — G4733 Obstructive sleep apnea (adult) (pediatric): Secondary | ICD-10-CM | POA: Diagnosis not present

## 2015-08-27 DIAGNOSIS — D649 Anemia, unspecified: Secondary | ICD-10-CM | POA: Diagnosis not present

## 2015-08-27 DIAGNOSIS — I2510 Atherosclerotic heart disease of native coronary artery without angina pectoris: Secondary | ICD-10-CM | POA: Diagnosis not present

## 2015-08-27 DIAGNOSIS — I10 Essential (primary) hypertension: Secondary | ICD-10-CM | POA: Diagnosis not present

## 2015-08-27 DIAGNOSIS — G4733 Obstructive sleep apnea (adult) (pediatric): Secondary | ICD-10-CM | POA: Diagnosis not present

## 2015-08-27 DIAGNOSIS — M170 Bilateral primary osteoarthritis of knee: Secondary | ICD-10-CM | POA: Diagnosis not present

## 2015-08-27 DIAGNOSIS — I4891 Unspecified atrial fibrillation: Secondary | ICD-10-CM | POA: Diagnosis not present

## 2015-08-28 DIAGNOSIS — I10 Essential (primary) hypertension: Secondary | ICD-10-CM | POA: Diagnosis not present

## 2015-08-28 DIAGNOSIS — D649 Anemia, unspecified: Secondary | ICD-10-CM | POA: Diagnosis not present

## 2015-08-28 DIAGNOSIS — I2510 Atherosclerotic heart disease of native coronary artery without angina pectoris: Secondary | ICD-10-CM | POA: Diagnosis not present

## 2015-08-28 DIAGNOSIS — K5900 Constipation, unspecified: Secondary | ICD-10-CM | POA: Diagnosis not present

## 2015-08-28 DIAGNOSIS — I4891 Unspecified atrial fibrillation: Secondary | ICD-10-CM | POA: Diagnosis not present

## 2015-08-28 DIAGNOSIS — K219 Gastro-esophageal reflux disease without esophagitis: Secondary | ICD-10-CM | POA: Diagnosis not present

## 2015-08-28 DIAGNOSIS — M150 Primary generalized (osteo)arthritis: Secondary | ICD-10-CM | POA: Diagnosis not present

## 2015-08-31 DIAGNOSIS — D649 Anemia, unspecified: Secondary | ICD-10-CM | POA: Diagnosis not present

## 2015-08-31 DIAGNOSIS — I10 Essential (primary) hypertension: Secondary | ICD-10-CM | POA: Diagnosis not present

## 2015-08-31 DIAGNOSIS — I4891 Unspecified atrial fibrillation: Secondary | ICD-10-CM | POA: Diagnosis not present

## 2015-08-31 DIAGNOSIS — M170 Bilateral primary osteoarthritis of knee: Secondary | ICD-10-CM | POA: Diagnosis not present

## 2015-08-31 DIAGNOSIS — I2510 Atherosclerotic heart disease of native coronary artery without angina pectoris: Secondary | ICD-10-CM | POA: Diagnosis not present

## 2015-08-31 DIAGNOSIS — G4733 Obstructive sleep apnea (adult) (pediatric): Secondary | ICD-10-CM | POA: Diagnosis not present

## 2015-09-01 DIAGNOSIS — M170 Bilateral primary osteoarthritis of knee: Secondary | ICD-10-CM | POA: Diagnosis not present

## 2015-09-01 DIAGNOSIS — I2510 Atherosclerotic heart disease of native coronary artery without angina pectoris: Secondary | ICD-10-CM | POA: Diagnosis not present

## 2015-09-01 DIAGNOSIS — D649 Anemia, unspecified: Secondary | ICD-10-CM | POA: Diagnosis not present

## 2015-09-01 DIAGNOSIS — I4891 Unspecified atrial fibrillation: Secondary | ICD-10-CM | POA: Diagnosis not present

## 2015-09-01 DIAGNOSIS — I10 Essential (primary) hypertension: Secondary | ICD-10-CM | POA: Diagnosis not present

## 2015-09-01 DIAGNOSIS — G4733 Obstructive sleep apnea (adult) (pediatric): Secondary | ICD-10-CM | POA: Diagnosis not present

## 2015-09-03 ENCOUNTER — Other Ambulatory Visit: Payer: Self-pay | Admitting: Cardiology

## 2015-09-03 DIAGNOSIS — M170 Bilateral primary osteoarthritis of knee: Secondary | ICD-10-CM | POA: Diagnosis not present

## 2015-09-03 DIAGNOSIS — I10 Essential (primary) hypertension: Secondary | ICD-10-CM | POA: Diagnosis not present

## 2015-09-03 DIAGNOSIS — G4733 Obstructive sleep apnea (adult) (pediatric): Secondary | ICD-10-CM | POA: Diagnosis not present

## 2015-09-03 DIAGNOSIS — D649 Anemia, unspecified: Secondary | ICD-10-CM | POA: Diagnosis not present

## 2015-09-03 DIAGNOSIS — I2510 Atherosclerotic heart disease of native coronary artery without angina pectoris: Secondary | ICD-10-CM | POA: Diagnosis not present

## 2015-09-03 DIAGNOSIS — I4891 Unspecified atrial fibrillation: Secondary | ICD-10-CM | POA: Diagnosis not present

## 2015-09-04 DIAGNOSIS — I4891 Unspecified atrial fibrillation: Secondary | ICD-10-CM | POA: Diagnosis not present

## 2015-09-04 DIAGNOSIS — D649 Anemia, unspecified: Secondary | ICD-10-CM | POA: Diagnosis not present

## 2015-09-04 DIAGNOSIS — I10 Essential (primary) hypertension: Secondary | ICD-10-CM | POA: Diagnosis not present

## 2015-09-04 DIAGNOSIS — M170 Bilateral primary osteoarthritis of knee: Secondary | ICD-10-CM | POA: Diagnosis not present

## 2015-09-04 DIAGNOSIS — I2510 Atherosclerotic heart disease of native coronary artery without angina pectoris: Secondary | ICD-10-CM | POA: Diagnosis not present

## 2015-09-04 DIAGNOSIS — G4733 Obstructive sleep apnea (adult) (pediatric): Secondary | ICD-10-CM | POA: Diagnosis not present

## 2015-09-07 DIAGNOSIS — G4733 Obstructive sleep apnea (adult) (pediatric): Secondary | ICD-10-CM | POA: Diagnosis not present

## 2015-09-07 DIAGNOSIS — D649 Anemia, unspecified: Secondary | ICD-10-CM | POA: Diagnosis not present

## 2015-09-07 DIAGNOSIS — M170 Bilateral primary osteoarthritis of knee: Secondary | ICD-10-CM | POA: Diagnosis not present

## 2015-09-07 DIAGNOSIS — I4891 Unspecified atrial fibrillation: Secondary | ICD-10-CM | POA: Diagnosis not present

## 2015-09-07 DIAGNOSIS — I10 Essential (primary) hypertension: Secondary | ICD-10-CM | POA: Diagnosis not present

## 2015-09-07 DIAGNOSIS — I2510 Atherosclerotic heart disease of native coronary artery without angina pectoris: Secondary | ICD-10-CM | POA: Diagnosis not present

## 2015-09-08 ENCOUNTER — Ambulatory Visit (HOSPITAL_COMMUNITY)
Admission: RE | Admit: 2015-09-08 | Discharge: 2015-09-08 | Disposition: A | Discharge status: Home / Self Care | Payer: Medicare Other | Source: Ambulatory Visit | Attending: Nurse Practitioner | Admitting: Nurse Practitioner

## 2015-09-08 ENCOUNTER — Encounter (HOSPITAL_COMMUNITY): Payer: Self-pay | Admitting: Nurse Practitioner

## 2015-09-08 VITALS — BP 128/74 | HR 77 | Ht 70.0 in | Wt 210.0 lb

## 2015-09-08 DIAGNOSIS — E785 Hyperlipidemia, unspecified: Secondary | ICD-10-CM | POA: Insufficient documentation

## 2015-09-08 DIAGNOSIS — Z6834 Body mass index (BMI) 34.0-34.9, adult: Secondary | ICD-10-CM | POA: Insufficient documentation

## 2015-09-08 DIAGNOSIS — Z7901 Long term (current) use of anticoagulants: Secondary | ICD-10-CM | POA: Diagnosis not present

## 2015-09-08 DIAGNOSIS — Z955 Presence of coronary angioplasty implant and graft: Secondary | ICD-10-CM | POA: Insufficient documentation

## 2015-09-08 DIAGNOSIS — Z8546 Personal history of malignant neoplasm of prostate: Secondary | ICD-10-CM | POA: Diagnosis not present

## 2015-09-08 DIAGNOSIS — I10 Essential (primary) hypertension: Secondary | ICD-10-CM | POA: Insufficient documentation

## 2015-09-08 DIAGNOSIS — G8929 Other chronic pain: Secondary | ICD-10-CM | POA: Diagnosis not present

## 2015-09-08 DIAGNOSIS — D649 Anemia, unspecified: Secondary | ICD-10-CM | POA: Diagnosis not present

## 2015-09-08 DIAGNOSIS — E669 Obesity, unspecified: Secondary | ICD-10-CM | POA: Insufficient documentation

## 2015-09-08 DIAGNOSIS — H269 Unspecified cataract: Secondary | ICD-10-CM | POA: Diagnosis not present

## 2015-09-08 DIAGNOSIS — Z0189 Encounter for other specified special examinations: Secondary | ICD-10-CM | POA: Insufficient documentation

## 2015-09-08 DIAGNOSIS — G4733 Obstructive sleep apnea (adult) (pediatric): Secondary | ICD-10-CM | POA: Insufficient documentation

## 2015-09-08 DIAGNOSIS — Z86010 Personal history of colonic polyps: Secondary | ICD-10-CM | POA: Insufficient documentation

## 2015-09-08 DIAGNOSIS — D72829 Elevated white blood cell count, unspecified: Secondary | ICD-10-CM | POA: Insufficient documentation

## 2015-09-08 DIAGNOSIS — R0602 Shortness of breath: Secondary | ICD-10-CM | POA: Diagnosis not present

## 2015-09-08 DIAGNOSIS — F419 Anxiety disorder, unspecified: Secondary | ICD-10-CM | POA: Diagnosis not present

## 2015-09-08 DIAGNOSIS — I481 Persistent atrial fibrillation: Secondary | ICD-10-CM | POA: Diagnosis not present

## 2015-09-08 DIAGNOSIS — I252 Old myocardial infarction: Secondary | ICD-10-CM | POA: Diagnosis not present

## 2015-09-08 DIAGNOSIS — K219 Gastro-esophageal reflux disease without esophagitis: Secondary | ICD-10-CM | POA: Insufficient documentation

## 2015-09-08 DIAGNOSIS — I2510 Atherosclerotic heart disease of native coronary artery without angina pectoris: Secondary | ICD-10-CM | POA: Insufficient documentation

## 2015-09-08 DIAGNOSIS — Z87891 Personal history of nicotine dependence: Secondary | ICD-10-CM | POA: Insufficient documentation

## 2015-09-08 DIAGNOSIS — N3281 Overactive bladder: Secondary | ICD-10-CM | POA: Diagnosis not present

## 2015-09-08 DIAGNOSIS — Z96651 Presence of right artificial knee joint: Secondary | ICD-10-CM | POA: Insufficient documentation

## 2015-09-08 DIAGNOSIS — I4819 Other persistent atrial fibrillation: Secondary | ICD-10-CM

## 2015-09-08 DIAGNOSIS — M545 Low back pain: Secondary | ICD-10-CM | POA: Diagnosis not present

## 2015-09-08 LAB — MAGNESIUM: Magnesium: 1.8 mg/dL (ref 1.7–2.4)

## 2015-09-08 NOTE — Patient Instructions (Signed)
Cardiac Rehab Maintenance -- [phone]

## 2015-09-08 NOTE — Progress Notes (Signed)
Patient ID: Jerry [name], male   DOB: 07/08/1929, 82 y.o.   MRN: [id number]     Primary Care Physician: [name], MD Referring Physician: Dr. Rayann Heman, [facility] f/u   Jerry [name] is a 82 y.o. male with a h/o afib symptomatic with fatigue and shortness of breath, whose chose Tikosyn therapy to restore sinus rhythm. The patient was admitted and Tikosyn was initiated. Renal function and electrolytes were followed during the hospitalization. The QTc remained stable. He developed fever, SOB/wheezing was seen by pulmonary medicine treated with BIPAP, antibiotics, pulmocort, medrol for pneumonia. He has remained afebrile for days, pulmonary service saw the patient, He did desaturate  with ambulation on RA and was held for another day of IV abx and monitoring. PT saw and evaluated the patient and recommended SNF for rehab, arrangements have been made with Case management.   He is in the afib clinic 3/6, for f/u and with sbrady at 58 bpm with stable QTC at 459 ms. The nursing facility did not give him tikosyn x 2 doses last week as they did not have in facility, even though they confirmed at d/c from hospital that they did have drug. Dr.Allred gave ok to resume drug. He feels he is recovering from pneumonia but is still too early to tell if he feels improved in SR. He states he had a soft slide out of the w/c this am and bumped his head but staff was there and assessed him and there is no bruising evident this pm. NO LOC.  Returns 4/4 and continues  in SR with stable qtc.  He is now back at home. Unfortunately the shortness of breath has not resolved with SR. I think this is multifactorial with age, deconditioning and recent anemia, all contributing.Marland KitchenHe is not having any bleeding issues with apixaban.Continues with PT but will be finishing soon. In a w/c for visit. States he cannot walk the distance from the parking garage to clinic without giving out of breath. NO pnd/orthopnea or ankle  swelling.  Today, he denies symptoms of  shortness of breath, orthopnea, PND, lower extremity edema, dizziness, presyncope, syncope, or neurologic sequela.  Positive for shortness of breath.The patient is tolerating medications without difficulties and is otherwise without complaint today.   Past Medical History  Diagnosis Date  . Hypertension     Labile -- takes Verapamil,Losartan,and Metoprolol daily   . Peripheral edema     takes HCTZ daily  . Hyperlipidemia     takes Lipitor nightly  . CAD S/P percutaneous coronary angioplasty 1996, 2009, 01/2010    PTCA of L Cx 1996; BMS-RCA [facility]) 2009; Staged PCI RCA (70% ISR) --> 3.0 mm x 23 mm BMS, staged PCI Diag - 2.0 mm x 12 mm Mini Vision BMS (2.25 mm)  . Myocardial infarction [facility]) 1996, 2009    x 2;last time was about 8-47yrs ago   . Osteoarthritis of both knees   . Chronic back pain     scoliosis--pt states unable to have surgery bc of age  . GERD (gastroesophageal reflux disease)     takes Nexium daily  . History of colon polyps   . Urinary frequency     takes Vesicare and Flomax daily  . Urinary urgency   . Prostate cancer ([place]) 2005    seed implant  . Cataracts, bilateral     immature  . Anxiety     takes Valium prn  . Bleeding nose     occasionally  . Obesity (BMI  30.0-34.9)     Last BMI ~31;   . OSA (obstructive sleep apnea)   . Heart palpitations     PACs, short bursts of PSVT  . Physical deconditioning   . Persistent atrial fibrillation ([place])   . Pneumonia due to organism   . Leukocytosis   . Anemia   . Rash of back   . OAB (overactive bladder)    Past Surgical History  Procedure Laterality Date  . Right knee arthrsoscopy  51yrs ago  . Cyst removed  in college  . Coronary angioplasty with stent placement  1996, 2009, August 2011    PTCA of L Cx 1996; BMS-RCA [facility]) 2009; Staged PCI RCA (70% ISR) --> 3.0 mm x 23 mm BMS, staged PCI Diag - 2.0 mm x 12 mm Mini Vision BMS (2.25 mm)  . Wisdom teeth extraccted     . Colonoscopy    . Seeds placed into prostate   2005  . Cholecystectomy    . Neuroplasty / transposition median nerve at carpal tunnel bilateral    . Right trigger finger    . Total knee arthroplasty  05/09/2012    Procedure: TOTAL KNEE ARTHROPLASTY;  Surgeon: Kerin Salen, MD;  Location: [place];  Service: Orthopedics;  Laterality: Right;  . Transthoracic echocardiogram  09/26/2012    EF 55-60%. Grade 2 diastolic dysfunction with moderate concentric LVH. Aortic sclerosis.  [name] Nm myoview ltd  November 2013    EF 53%; fixed mid inferior-inferolateral defect, infarct with no ischemia.  [name] Nm myoview ltd  11/24/2010    ejection fraction 58%,left ventricle is normal size ,no evidence of inducible myocardial ischemia  . Cardioversion N/A 06/12/2015    Procedure: CARDIOVERSION;  Surgeon: Sanda Klein, MD;  [facility] END[facility];  Service: Cardiovascular;  Laterality: N/A;  . Tee without cardioversion N/A 06/12/2015    Procedure: TRANSESOPHAGEAL ECHOCARDIOGRAM (TEE);  Surgeon: Sanda Klein, MD;  Location: [facility] ENDOSCOPY;  Service: Cardiovascular;  Laterality: N/A;  . Cardioversion N/A 08/02/2015    Procedure: CARDIOVERSION;  Surgeon: [name], MD;  Location: [facility] OR;  Service: Cardiovascular;  Laterality: N/A;    Current Outpatient Prescriptions  Medication Sig Dispense Refill  . apixaban (ELIQUIS) 5 MG TABS tablet Take 1 tablet (5 mg total) by mouth 2 (two) times daily. 90 tablet 1  . atorvastatin (LIPITOR) 40 MG tablet TAKE 1 TABLET DAILY AT BEDTIME 90 tablet 0  . Cholecalciferol (D3 ADULT PO) Take 1,000 Units by mouth at bedtime.     . diazepam (VALIUM) 2 MG tablet Take 2 mg by mouth daily as needed for anxiety.     [name] diltiazem (CARDIZEM CD) 240 MG 24 hr capsule Take 1 capsule (240 mg total) by mouth daily. 30 capsule 3  . dofetilide (TIKOSYN) 500 MCG capsule Take 1 capsule (500 mcg total) by mouth 2 (two) times daily. 180 capsule 3  . ferrous sulfate 325 (65 FE) MG tablet Take 325 mg by mouth  daily with breakfast.    . losartan (COZAAR) 100 MG tablet TAKE 1 TABLET DAILY 90 tablet 0  . magnesium oxide (MAG-OX) 400 MG tablet Take 1 tablet (400 mg total) by mouth 2 (two) times daily. 60 tablet 6  . metoprolol succinate (TOPROL-XL) 25 MG 24 hr tablet TAKE 1 TABLET DAILY 90 tablet 0  . mirabegron ER (MYRBETRIQ) 25 MG TB24 tablet Take 25 mg by mouth daily.     [name] omeprazole (PRILOSEC) 20 MG capsule Take 20 mg by mouth daily.    [name]  Polyethylene Glycol 3350 (MIRALAX PO) Take 17 g by mouth daily as needed (Constipation).     . potassium chloride 20 MEQ TBCR Take 20 mEq by mouth daily. 30 tablet 3  . tamsulosin (FLOMAX) 0.4 MG CAPS capsule Take 0.4 mg by mouth at bedtime.     [name] acetaminophen (TYLENOL) 500 MG tablet Take 1,000 mg by mouth daily as needed (pain).    . hydrocortisone cream 1 % Apply 1 application topically daily as needed (rash). For one week    . magnesium hydroxide (MILK OF MAGNESIA) 400 MG/5ML suspension Take 30 mLs by mouth daily as needed for mild constipation.    . nitroGLYCERIN (NITROSTAT) 0.4 MG SL tablet Place 0.4 mg under the tongue every 5 (five) minutes as needed for chest pain.      No current facility-administered medications for this encounter.    Allergies  Allergen Reactions  . Crab [Shellfish Allergy] Rash  . Hydrocodone Hives  . Adhesive [Tape] Rash  . Oxycodone Rash    Social History   Social History  . Marital Status: Married    Spouse Name: N/A  . Number of Children: N/A  . Years of Education: N/A   Occupational History  . Retired    Social History Main Topics  . Smoking status: Former Smoker    Types: Pipe    Quit date: 03/12/1993  . Smokeless tobacco: Not on file     Comment: quit smoking pipe about 89yrs ago  . Alcohol Use: Yes     Comment: glass of wine daily  . Drug Use: No  . Sexual Activity: Not Currently   Other Topics Concern  . Not on file   Social History Narrative   He is a married father of 2, grandfather of 2. He  exercises at least 3 to 4 times a week but does something 6 to 7 days a week. He does not smoke. Drinks occasional alcohol.     No family history on file.  ROS- All systems are reviewed and negative except as per the HPI above  Physical Exam: Filed Vitals:   09/08/15 1036  BP: 128/74  Pulse: 77  Height: 5\' 10"  (1.778 m)  Weight: 210 lb (95.255 kg)    GEN- The patient is well appearing, alert and oriented x 3 today.   Head- normocephalic, atraumatic Eyes-  Sclera clear, conjunctiva pink Ears- hearing intact Oropharynx- clear Neck- supple, no JVP Lymph- no cervical lymphadenopathy Lungs- Clear to ausculation bilaterally, normal work of breathing Heart- Regular rate and rhythm, no murmurs, rubs or gallops, PMI not laterally displaced GI- soft, NT, ND, + BS Extremities- no clubbing, cyanosis, or edema MS- no significant deformity or atrophy Skin- no rash or lesion Psych- euthymic mood, full affect Neuro- strength and sensation are intact  EKG- Sinus rthym at 77 bpm, pr int 154 ms, qrs int 90 ms, qtc 459 ms (stable) Epic records reviewed    Assessment and Plan: 1. afib Maintaining SR after loading of tikosyn 500 mg bid Continue drug Continue apixaban CBC drawn at PCP office 3/24, Hgb shows improvement at 9.5 up from  8.1 3 weeks ago. Pt taking iron Bmet 3/24  Revealed creatinine at 0.96 and k+ at 4.0 Mag at 1.5, 3/24, and pt was found to be taking mag supp only one  a day and has subsequently increased to bid with mag today at 1.8  2. Pneumonia, resolved He is feeling better  3. Shortness of breath No significant improvement with restoration  of SR Probably multifactorial with deconditioning playing a role   F/u with afib clinic 3 months  Butch Penny C. Edgel Degnan, [facility] [address] [place], [place] [address] [phone]

## 2015-09-10 DIAGNOSIS — I2510 Atherosclerotic heart disease of native coronary artery without angina pectoris: Secondary | ICD-10-CM | POA: Diagnosis not present

## 2015-09-10 DIAGNOSIS — D649 Anemia, unspecified: Secondary | ICD-10-CM | POA: Diagnosis not present

## 2015-09-10 DIAGNOSIS — I4891 Unspecified atrial fibrillation: Secondary | ICD-10-CM | POA: Diagnosis not present

## 2015-09-10 DIAGNOSIS — M170 Bilateral primary osteoarthritis of knee: Secondary | ICD-10-CM | POA: Diagnosis not present

## 2015-09-10 DIAGNOSIS — G4733 Obstructive sleep apnea (adult) (pediatric): Secondary | ICD-10-CM | POA: Diagnosis not present

## 2015-09-10 DIAGNOSIS — I10 Essential (primary) hypertension: Secondary | ICD-10-CM | POA: Diagnosis not present

## 2015-09-14 DIAGNOSIS — D649 Anemia, unspecified: Secondary | ICD-10-CM | POA: Diagnosis not present

## 2015-09-14 DIAGNOSIS — I10 Essential (primary) hypertension: Secondary | ICD-10-CM | POA: Diagnosis not present

## 2015-09-14 DIAGNOSIS — M170 Bilateral primary osteoarthritis of knee: Secondary | ICD-10-CM | POA: Diagnosis not present

## 2015-09-14 DIAGNOSIS — I2510 Atherosclerotic heart disease of native coronary artery without angina pectoris: Secondary | ICD-10-CM | POA: Diagnosis not present

## 2015-09-14 DIAGNOSIS — I4891 Unspecified atrial fibrillation: Secondary | ICD-10-CM | POA: Diagnosis not present

## 2015-09-14 DIAGNOSIS — G4733 Obstructive sleep apnea (adult) (pediatric): Secondary | ICD-10-CM | POA: Diagnosis not present

## 2015-09-15 ENCOUNTER — Telehealth: Payer: Self-pay | Admitting: Cardiology

## 2015-09-15 NOTE — Telephone Encounter (Signed)
I am fine with [facility] Out Pt Program.  Ellyn Hack, Leonie Green, MD

## 2015-09-15 NOTE — Telephone Encounter (Signed)
Physical Therapist w [facility] calling w/ request. Note pt has f/u w [name] on 4/20.  Will route for recommendations.

## 2015-09-15 NOTE — Telephone Encounter (Signed)
Pt need to go to Cardio Pulmonary Out pt Program. He would like for Dr Kaleen Odea to give this order please. Please fax this to-Cardio Pulmonary at [facility].

## 2015-09-16 DIAGNOSIS — D649 Anemia, unspecified: Secondary | ICD-10-CM | POA: Diagnosis not present

## 2015-09-16 DIAGNOSIS — I10 Essential (primary) hypertension: Secondary | ICD-10-CM | POA: Diagnosis not present

## 2015-09-16 DIAGNOSIS — I4891 Unspecified atrial fibrillation: Secondary | ICD-10-CM | POA: Diagnosis not present

## 2015-09-16 DIAGNOSIS — G4733 Obstructive sleep apnea (adult) (pediatric): Secondary | ICD-10-CM | POA: Diagnosis not present

## 2015-09-16 DIAGNOSIS — M170 Bilateral primary osteoarthritis of knee: Secondary | ICD-10-CM | POA: Diagnosis not present

## 2015-09-16 DIAGNOSIS — I2510 Atherosclerotic heart disease of native coronary artery without angina pectoris: Secondary | ICD-10-CM | POA: Diagnosis not present

## 2015-09-16 NOTE — Telephone Encounter (Signed)
Discussed w/ Cardiac Rehab Pt would not currently meet coverable criteria for medicare recipients. Aware he can do a maintenance program w/ self-pay plan. Returned call to [facility], PT - aware of recommendations. He is trying to transition patient to new program - notes he's graduated from home PT. Main concern is patient deconditioning/regressing on some of his personal improvements. On review of all information, thinks he would benefit from standard outpatient physical therapy program, which he will get PCP to review and refer for. Aware the cardiac rehab enrollment can be addressed based on acuity or new problems.  Will call if further needs.

## 2015-09-22 ENCOUNTER — Ambulatory Visit: Payer: Medicare Other | Attending: Family Medicine | Admitting: Physical Therapy

## 2015-09-22 DIAGNOSIS — M6281 Muscle weakness (generalized): Secondary | ICD-10-CM | POA: Diagnosis not present

## 2015-09-22 DIAGNOSIS — M545 Low back pain, unspecified: Secondary | ICD-10-CM

## 2015-09-22 DIAGNOSIS — R2689 Other abnormalities of gait and mobility: Secondary | ICD-10-CM | POA: Diagnosis not present

## 2015-09-22 DIAGNOSIS — M25562 Pain in left knee: Secondary | ICD-10-CM | POA: Insufficient documentation

## 2015-09-22 NOTE — Therapy (Signed)
[facility] [address], [place], [place], [address] Phone: [phone]   Fax:  [phone]  Physical Therapy Evaluation  Patient Details  Name: Jerry [name] MRN: [id number] Date of Birth: [birth year] Referring Provider: Dr. Alroy Dust  Encounter Date: 09/22/2015      PT End of Session - 09/22/15 1512    Visit Number 1   Number of Visits 10   Date for PT Re-Evaluation 11/17/15   Authorization Type Medicare G codes;  KX at visit 15   PT Start Time 1152   PT Stop Time 1230   PT Time Calculation (min) 38 min   Activity Tolerance Patient tolerated treatment well      Past Medical History  Diagnosis Date  . Hypertension     Labile -- takes Verapamil,Losartan,and Metoprolol daily   . Peripheral edema     takes HCTZ daily  . Hyperlipidemia     takes Lipitor nightly  . CAD S/P percutaneous coronary angioplasty 1996, 2009, 01/2010    PTCA of L Cx 1996; BMS-RCA [facility]) 2009; Staged PCI RCA (70% ISR) --> 3.0 mm x 23 mm BMS, staged PCI Diag - 2.0 mm x 12 mm Mini Vision BMS (2.25 mm)  . Myocardial infarction [facility]) 1996, 2009    x 2;last time was about 8-28yrs ago   . Osteoarthritis of both knees   . Chronic back pain     scoliosis--pt states unable to have surgery bc of age  . GERD (gastroesophageal reflux disease)     takes Nexium daily  . History of colon polyps   . Urinary frequency     takes Vesicare and Flomax daily  . Urinary urgency   . Prostate cancer ([place]) 2005    seed implant  . Cataracts, bilateral     immature  . Anxiety     takes Valium prn  . Bleeding nose     occasionally  . Obesity (BMI 30.0-34.9)     Last BMI ~31;   . OSA (obstructive sleep apnea)   . Heart palpitations     PACs, short bursts of PSVT  . Physical deconditioning   . Persistent atrial fibrillation ([place])   . Pneumonia due to organism   . Leukocytosis   . Anemia   . Rash of back   . OAB (overactive bladder)     Past  Surgical History  Procedure Laterality Date  . Right knee arthrsoscopy  57yrs ago  . Cyst removed  in college  . Coronary angioplasty with stent placement  1996, 2009, August 2011    PTCA of L Cx 1996; BMS-RCA [facility]) 2009; Staged PCI RCA (70% ISR) --> 3.0 mm x 23 mm BMS, staged PCI Diag - 2.0 mm x 12 mm Mini Vision BMS (2.25 mm)  . Wisdom teeth extraccted    . Colonoscopy    . Seeds placed into prostate   2005  . Cholecystectomy    . Neuroplasty / transposition median nerve at carpal tunnel bilateral    . Right trigger finger    . Total knee arthroplasty  05/09/2012    Procedure: TOTAL KNEE ARTHROPLASTY;  Surgeon: Kerin Salen, MD;  Location: [place];  Service: Orthopedics;  Laterality: Right;  . Transthoracic echocardiogram  09/26/2012    EF 55-60%. Grade 2 diastolic dysfunction with moderate concentric LVH. Aortic sclerosis.  [name] Nm myoview ltd  November 2013    EF 53%; fixed mid inferior-inferolateral defect, infarct with no ischemia.  [name] Nm  myoview ltd  11/24/2010    ejection fraction 58%,left ventricle is normal size ,no evidence of inducible myocardial ischemia  . Cardioversion N/A 06/12/2015    Procedure: CARDIOVERSION;  Surgeon: Sanda Klein, MD;  [facility];  Service: Cardiovascular;  Laterality: N/A;  . Tee without cardioversion N/A 06/12/2015    Procedure: TRANSESOPHAGEAL ECHOCARDIOGRAM (TEE);  Surgeon: Sanda Klein, MD;  Location: [place];  Service: Cardiovascular;  Laterality: N/A;  . Cardioversion N/A 08/02/2015    Procedure: CARDIOVERSION;  Surgeon: [name], MD;  Location: [facility] OR;  Service: Cardiovascular;  Laterality: N/A;    There were no vitals filed for this visit.       Subjective Assessment - 09/22/15 1156    Subjective One year ago had a fall in the garage fx occular bone, facial soft tissue injury.   Within the last year worsening of  lumbar stenosis.  Hospitalized in Feb. ? flu, pneumonia x 1week.  Went to [employer] x 2weeks;     Just completed  HHPT for walking 300 feet;   c/o decreased stamina and shortness of breath;   states he used pulse ox with HHPT with 88% O2 sat with walking but would be > 90% within 2 min;  c/o decreased balance and uses SPC 85% of the time.     Patient is accompained by: Family member  "Jerry [name]"   Pertinent History A fib;  no  cardiac implants; cardioversion;  hx of prostrate CA    Limitations Walking;House hold activities   How long can you stand comfortably? unable to stand to shave or make a sandwich   How long can you walk comfortably? 300 feet with shortness of breath and low back pain   Patient Stated Goals be able to have more stamina;  participate in household chores (plan and cook meals);  get in /out of the car and walk into church, cinema, symphony   Currently in Pain? No/denies   Pain Location Knee  absent cartilage   Pain Orientation Left   Aggravating Factors  stand/walk;     Multiple Pain Sites Yes   Pain Score 0   Pain Location Back   Aggravating Factors  stand/walk; AM            [facility] PT Assessment - 09/22/15 0001    Assessment   Medical Diagnosis osteoarthritis; decreased balance and endurance   Referring Provider Dr. Alroy Dust   Onset Date/Surgical Date --  1 year   Hand Dominance Right   Next MD Visit 09/25/15   Prior Therapy HHPT   Precautions   Precautions None   Restrictions   Weight Bearing Restrictions No   Balance Screen   Has the patient fallen in the past 6 months No   Has the patient had a decrease in activity level because of a fear of falling?  Yes   Is the patient reluctant to leave their home because of a fear of falling?  No   Home Environment   Living Environment Private residence   Living Arrangements Spouse/significant other   Available Help at Discharge Family   Type of [place] to enter   Entrance Stairs-Number of Steps 2   Entrance Stairs-Rails None   Home Layout Two level;Bed/bath upstairs   Alternate Level Stairs-Number  of Steps 13   Alternate Level Stairs-Rails Right   [place] - single point   Prior Function   Level of Independence Independent with basic ADLs   Vocation  Retired   Leisure read; watch TV; eat, drink wine   Posture/Postural Control   Posture/Postural Control Postural limitations   Postural Limitations Decreased lumbar lordosis   ROM / Strength   AROM / PROM / Strength AROM;Strength   AROM   Overall AROM Comments hips, knees, ankles WFLs except lacks full knee extension in sitting secondary to decreased HS length   Strength   Overall Strength Comments Able to rise from standard height chair with min use of UEs;  decreased activation of transverse abdominals;  decreased hip abduction strength 4-/5   Flexibility   Soft Tissue Assessment /Muscle Length yes   Hamstrings Bilaterally 65 degrees   Quadriceps Bilaterally 0 degrees   Ambulation/Gait   Ambulation/Gait Yes   Ambulation/Gait Assistance 5: Supervision   Ambulation Distance (Feet) 75 Feet   Assistive device Straight cane   Gait Pattern Decreased stride length   Gait Comments 5x sit to stand 14 sec   Standardized Balance Assessment   Standardized Balance Assessment Timed Up and Go Test;Five Times Sit to Stand   Five times sit to stand comments  14   Timed Up and Go Test   Manual TUG (seconds) 15.4                             PT Short Term Goals - 09/22/15 1532    PT SHORT TERM GOAL #1   Title The patient will have improved HS muscle length to 70 degrees needed for greater ease getting in and out of the car.  10/20/15   Time 4   Period Weeks   Status New   PT SHORT TERM GOAL #2   Title The patient will have improved trunk and LE strength be able to stand 3 min for grooming, dressing tasks   Time 4   Period Weeks   Status New   PT SHORT TERM GOAL #3   Title The patient will be able to ambulate 500 feet with SPC and min to moderate back and knee pain needed for short distance community  ambulation to church, [employer], symphony   Time 4   Period Weeks   Status New   PT SHORT TERM GOAL #4   Title The patient will have improved hip flexor muscle length to 10 degrees needed for walking longer distances with decreased energy expenditure   Time 4   Period Weeks   Status New   PT SHORT TERM GOAL #5   Title Timed up and go test improved to 14 sec indicating improved gait speed and safety   Time 4   Period Weeks   Status New           PT Long Term Goals - 09/22/15 1537    PT LONG TERM GOAL #1   Title The patient will be independent with safe self progression of HEP to improved walking distance and further conditioning   11/17/15   Time 8   Period Weeks   Status New   PT LONG TERM GOAL #2   Title The patient will have improved core and hip strength to grossly 4+/5 needed to stand for 5 min for light cooking   Time 8   Period Weeks   Status New   PT LONG TERM GOAL #3   Title The patient will be able to ambulate 700 feet with SPC with min to moderate back and knee pain for ambulating longer community distances to the  symphony, church and movie theatre.     Time 8   Period Weeks   Status New   PT LONG TERM GOAL #4   Title The patient will have improved Timed up and Go test to 12 sec indicating improved gait speed and safety    Time 8   Period Weeks   Status New   PT LONG TERM GOAL #5   Title Patient will have improved LE strength needed for 5x sit to stand test < 13 sec   Time 8   Period Weeks   Status New   Additional Long Term Goals   Additional Long Term Goals Yes   PT LONG TERM GOAL #6   Title BERG balance test of at least 42/56 indicating lower risk of falls    Time 8   Period Weeks   Status New               Plan - 09/22/15 1514    Clinical Impression Statement The patient is of moderate evaluation complexity.  He reports a decline in functional status beginning almost a year ago when he fell in the garage.  He was hospitalized in  February for procedure for his A-fib with prolonged stay secondary to pneumonia.  He has continued  to have poor stamina after a short term rehab stay and HHPT.  His ambulation distance is limited to 300 feet with SPC with decreased O2 sat levels and low back.  He is unable to stand for any length of time for shaving or light cooking.   Decreased HS and psoas muscle lengths bilaterally.  Decreased core and trunk muscle strength 3-/5.  LEs grossly 4/5 except hip abd 3+/5.  Decreased pain with sitting typical with stenosis.  Decreased gait speed with timed up and go test 15.4 with decreased stride length.  Able to rise from chair with min UE use.  Patient is fearful of falls and will do further assessment next visit for balance.  Patient would benefit from PT to address these deficits.     Rehab Potential Good   Clinical Impairments Affecting Rehab Potential A fib;  Right TKR; lumbar spinal stenosis   PT Frequency 2x / week   PT Duration 8 weeks   PT Treatment/Interventions ADLs/Self Care Home Management;Electrical Stimulation;Moist Heat;Therapeutic exercise;Ultrasound;Patient/family education;Manual techniques;Taping;Dry needling;Gait training;Functional mobility training;Neuromuscular re-education   PT Next Visit Plan BERG balance test;  6 MWT or 10 m walk test;  low level HS and psoas stretch;  gait with SPC with pulse ox monitoring;  standing balance;  abdominal bracing      Patient will benefit from skilled therapeutic intervention in order to improve the following deficits and impairments:  Pain, Decreased balance, Decreased activity tolerance, Decreased strength, Decreased mobility, Difficulty walking  Visit Diagnosis: Muscle weakness (generalized) - Plan: PT plan of care cert/re-cert  Other abnormalities of gait and mobility - Plan: PT plan of care cert/re-cert  Bilateral low back pain without sciatica - Plan: PT plan of care cert/re-cert  Pain in left knee - Plan: PT plan of care  cert/re-cert      G-Codes - [id number]    Functional Assessment Tool Used TUG; clinical judgement   Functional Limitation Mobility: Walking and moving around   Mobility: Walking and Moving Around Current Status [id number]) At least 60 percent but less than 80 percent impaired, limited or restricted   Mobility: Walking and Moving Around Goal Status [id number]) At least 40 percent but less than 60 percent impaired,  limited or restricted       Problem List Patient Active Problem List   Diagnosis Date Noted  . Pneumonia   . Pneumonia due to organism   . Fever   . Acute respiratory failure ([place])   . Persistent atrial fibrillation ([place])   . Atrial fibrillation ([place]) 06/09/2015  . Exertional dyspnea and fatigue 08/13/2013  . CAD S/P percutaneous coronary angioplasty   . Hyperlipidemia with target LDL less than 70   . Essential hypertension   . Obesity (BMI 30.0-34.9)   . Myocardial infarction; history of   . Osteoarthritis of right knee 05/11/2012    Ruben Im C 09/22/2015, 3:49 PM  [facility] [address] [place], [place], [address] Phone: [phone]   Fax:  [phone]  Name: Jerry [name] MRN: [id number] Date of Birth: 09/27/1929   Ruben Im, PT 09/22/2015 3:49 PM Phone: [phone] Fax: [phone]

## 2015-09-24 ENCOUNTER — Ambulatory Visit (INDEPENDENT_AMBULATORY_CARE_PROVIDER_SITE_OTHER): Payer: Medicare Other | Admitting: Cardiology

## 2015-09-24 VITALS — BP 112/70 | HR 63 | Ht 69.0 in | Wt 211.0 lb

## 2015-09-24 DIAGNOSIS — I481 Persistent atrial fibrillation: Secondary | ICD-10-CM

## 2015-09-24 DIAGNOSIS — J189 Pneumonia, unspecified organism: Secondary | ICD-10-CM

## 2015-09-24 DIAGNOSIS — I10 Essential (primary) hypertension: Secondary | ICD-10-CM

## 2015-09-24 DIAGNOSIS — E785 Hyperlipidemia, unspecified: Secondary | ICD-10-CM

## 2015-09-24 DIAGNOSIS — Z9861 Coronary angioplasty status: Secondary | ICD-10-CM

## 2015-09-24 DIAGNOSIS — I480 Paroxysmal atrial fibrillation: Secondary | ICD-10-CM | POA: Diagnosis not present

## 2015-09-24 DIAGNOSIS — I2119 ST elevation (STEMI) myocardial infarction involving other coronary artery of inferior wall: Secondary | ICD-10-CM

## 2015-09-24 DIAGNOSIS — I4819 Other persistent atrial fibrillation: Secondary | ICD-10-CM

## 2015-09-24 DIAGNOSIS — I2510 Atherosclerotic heart disease of native coronary artery without angina pectoris: Secondary | ICD-10-CM | POA: Diagnosis not present

## 2015-09-24 DIAGNOSIS — R0609 Other forms of dyspnea: Secondary | ICD-10-CM

## 2015-09-24 MED ORDER — FUROSEMIDE 20 MG PO TABS: 20.0000 mg | ORAL_TABLET | Freq: Every day | ORAL | Status: DC | PRN

## 2015-09-24 NOTE — Patient Instructions (Addendum)
Your physician recommends that you schedule a follow-up appointment in: 3-4 Months  Your physician has recommended you make the following change in your medication: START Lasix(Furosemide) 20 mg daily as needed

## 2015-09-24 NOTE — Progress Notes (Signed)
PCP: [name], MD  Clinic Note: Chief Complaint  Patient presents with  . 8 WEEKS    pt denied chest pain, pt c/o SOB on exertion  . Atrial Fibrillation    New onset  . Coronary Artery Disease    HPI: Jerry [name] is a 82 y.o. male with a PMH below who presents today for 3 month follow-up for atrial fibrillation, and CAD. He has chronic exertional dyspnea and fatigue as well as labile blood pressures. He also was noted to have frequent PACs and palpitations. Cardiac History:   CAD/ previous MI -->  s/p PTCA of LCx 1996; BMS-RCA [facility]) 2009; Staged PCI RCA (70% ISR) --> 3.0 mm x 23 mm BMS, staged PCI Diag - 2.0 mm x 12 mm Mini Vision BMS (2.25 mm) in 2011,   He had a Myoview in 04/2012 that showed a fixed mid inferior-inferolateral defect/ infarct with no ischemia. EF was 53%.   Echocardiogram 09/2012 also showed normal EF of 55-60% w/ G2DD, aortic sclerosis w/o stenosis and mild PAH. - Most recent Echo in January 2016 now shows normal EF 60-65% with normal PA pressures.  I actually last saw him in March 2016. When I last saw him, his activity level was really induced secondary to spinal stenosis with pain in his hips and back. Significant amount of energy is used for walking around. This does make him short of breath. He noted mild edema in his legs ago down at night. With his spinal stenosis and back pain he has difficulty with stability and balance.  Jerry [name] has had multiple clinic visits and procedures since I last saw him.  Recent Clinic Visits with APPs:   06/09/2015 with Ellen Henri, PA-C: Work in for 2 weeks of exertional dyspnea that was progressively worse. Denies any chest pain or weight gain. Only exertional dyspnea no PND or orthopnea and no syncope near syncope. --> EKG demonstrated rate controlled atrial fibrillation in the 70s with stable blood pressure. He was already on Toprol and verapamil. --> Started on ELIQUIS and scheduled for TEE  /DCCV --> initial post cardioversion EKG showed frequent PACs and blocked PACs but sinus rhythm.  Follow-up with Rosaria Ferries, PA-C 06/25/2015: Noted stable exertional dyspnea no palpitations. Several nosebleeds. Had a evaluation and treatment by ENT. --> EKG again demonstrated atrial fibrillation with controlled rate.  Due to his symptoms, the plan was for him to be referred to electrophysiology A. fib clinic. No oxygen desaturations with ambulation, simply subjective dyspnea  Atrial Fibrillation Clinic follow-up with Roderic Palau, NP I can see: Noted to be in persistent A. fib. This is recurrent since his initial conversion. Rhythm control agents discussed work Production assistant, radio and amiodarone. They decided to stick with Tikosyn. He was converted from verapamil to Cardizem. Benadryl HCTZ were also stopped.  Return visit in A. fib clinic on February 21: Noted to still be in A. fib RVR. At that time plan was to start Tikosyn. He was therefore admitted for Tikosyn. Continued on ELIQUIS. Plan was cardioversion if Tikosyn did not cardiovert within 3 doses.  Admitted for Tikosyn loading on 07/28/2015 --> however he had developed a fever of 101 the night prior to admission and was found to have a pneumonia. He was admitted to critical care with multifocal pneumonia. He was placed on BiPAP and broad-spectrum antibiotics. - After initial stabilization for his pneumonia, he finally underwent cardioversion on February 26 restoring normal sinus rhythm. -He was discharged initially to skilled nursing facility.  Is now at home.   Studies Reviewed:   TEE 06/12/2015: EF 55-60%. Left atrial or appendage thrombus. Normal peak PA pressures of 33 mmHg.  Transthoracic Echo 06/22/2015: EF 60-65%. Mild LA dilation. Normal peak PA pressures of 32 mmHg.  Nasal packing by Dr. Erik Obey on February 4  Interval History: Jerry [name] actually presents today pretty much close to his baseline. He seems to have recovered from his  pneumonia pretty quickly he only spent 1 week at [facility] for rehabilitation. He says currently came home. He was just very weak following his hospital stay. He is not noted any rapid irregular heartbeats since the ostial stay, but is just finding it hard to "get up and go ". He is just noting poor stamina. He is doing outpatient rehabilitation and started to get more energy level now. We have is really limited because of his poor balance and back pain. He chronically sleeps in a recliner but denies any significant dyspnea. But this is more because of his back pain then breathing. He has known poor balance and difficult to walking from peripheral neuropathy likely. He denies any chest tightness or pressure with rest or exertion, but has had dyspnea with exertion. Mild edema. No orthopnea or PND.  He notes occasional lightheadedness, dizziness, but no weakness or syncope/near syncope. No TIA/amaurosis fugax symptoms. No melena, hematochezia, hematuria, or epstaxis. No claudication.  ROS: A comprehensive was performed. Review of Systems  Constitutional: Positive for malaise/fatigue (Gradually improving). Negative for fever and chills.  HENT: Positive for nosebleeds (No more since treatment).   Respiratory: Positive for shortness of breath (Stable exertional dyspnea).   Cardiovascular: Positive for leg swelling (Stable). Negative for claudication.  Gastrointestinal: Negative for melena.  Genitourinary: Negative for hematuria.  Neurological: Positive for tingling (Occasionally noted in the right arm.).  Psychiatric/Behavioral: Negative for memory loss. The patient is not nervous/anxious and does not have insomnia.   All other systems reviewed and are negative.   Past Medical History  Diagnosis Date  . Hypertension     Labile -- takes Verapamil,Losartan,and Metoprolol daily   . Peripheral edema     takes HCTZ daily  . Hyperlipidemia     takes Lipitor nightly  . CAD S/P percutaneous  coronary angioplasty 1996, 2009, 01/2010    PTCA of L Cx 1996; BMS-RCA [facility]) 2009; Staged PCI RCA (70% ISR) --> 3.0 mm x 23 mm BMS, staged PCI Diag - 2.0 mm x 12 mm Mini Vision BMS (2.25 mm)  . Myocardial infarction [facility]) 1996, 2009    x 2;last time was about 8-40yrs ago   . Osteoarthritis of both knees   . Chronic back pain     scoliosis--pt states unable to have surgery bc of age  . GERD (gastroesophageal reflux disease)     takes Nexium daily  . History of colon polyps   . Urinary frequency     takes Vesicare and Flomax daily  . Urinary urgency   . Prostate cancer ([place]) 2005    seed implant  . Cataracts, bilateral     immature  . Anxiety     takes Valium prn  . Bleeding nose     occasionally  . Obesity (BMI 30.0-34.9)     Last BMI ~31;   . OSA (obstructive sleep apnea)   . Heart palpitations     PACs, short bursts of PSVT  . Physical deconditioning   . Persistent atrial fibrillation ([place])   . Pneumonia due to organism   .  Leukocytosis   . Anemia   . Rash of back   . OAB (overactive bladder)     Past Surgical History  Procedure Laterality Date  . Right knee arthrsoscopy  68yrs ago  . Cyst removed  in college  . Coronary angioplasty with stent placement  1996, 2009, August 2011    PTCA of L Cx 1996; BMS-RCA [facility]) 2009; Staged PCI RCA (70% ISR) --> 3.0 mm x 23 mm BMS, staged PCI Diag - 2.0 mm x 12 mm Mini Vision BMS (2.25 mm)  . Wisdom teeth extraccted    . Colonoscopy    . Seeds placed into prostate   2005  . Cholecystectomy    . Neuroplasty / transposition median nerve at carpal tunnel bilateral    . Right trigger finger    . Total knee arthroplasty  05/09/2012    Procedure: TOTAL KNEE ARTHROPLASTY;  Surgeon: Kerin Salen, MD;  Location: [place];  Service: Orthopedics;  Laterality: Right;  . Transthoracic echocardiogram  09/26/2012    EF 55-60%. Grade 2 diastolic dysfunction with moderate concentric LVH. Aortic sclerosis.  [name] Nm myoview ltd  November 2013    EF  53%; fixed mid inferior-inferolateral defect, infarct with no ischemia.  [name] Nm myoview ltd  11/24/2010    ejection fraction 58%,left ventricle is normal size ,no evidence of inducible myocardial ischemia  . Cardioversion N/A 06/12/2015    Procedure: CARDIOVERSION;  Surgeon: Sanda Klein, MD;  Location: [facility] ENDOSCOPY;  Service: Cardiovascular;  Laterality: N/A;  . Tee without cardioversion N/A 06/12/2015    Procedure: TRANSESOPHAGEAL ECHOCARDIOGRAM (TEE);  Surgeon: Sanda Klein, MD;  [facility] ENDOSCOPY;  Service: Cardiovascular;  Laterality: N/A;  . Cardioversion N/A 08/02/2015    Procedure: CARDIOVERSION;  Surgeon: [name], MD;  Location: [place];  Service: Cardiovascular;  Laterality: N/A;   Prior to Admission medications   Medication Sig Start Date End Date Taking? Authorizing Provider  acetaminophen (TYLENOL) 500 MG tablet Take 1,000 mg by mouth daily as needed (pain).   Yes Historical Provider, MD  apixaban (ELIQUIS) 5 MG TABS tablet Take 1 tablet (5 mg total) by mouth 2 (two) times daily. 06/25/15  Yes Rhonda G Barrett, PA-C  atorvastatin (LIPITOR) 40 MG tablet TAKE 1 TABLET DAILY AT BEDTIME 08/24/15  Yes Leonie Man, MD  Cholecalciferol (D3 ADULT PO) Take 1,000 Units by mouth at bedtime.    Yes Historical Provider, MD  diazepam (VALIUM) 2 MG tablet Take 2 mg by mouth daily as needed for anxiety.    Yes Historical Provider, MD  diltiazem (CARDIZEM CD) 240 MG 24 hr capsule Take 1 capsule (240 mg total) by mouth daily. 07/16/15  Yes Sherran Needs, NP  dofetilide (TIKOSYN) 500 MCG capsule Take 1 capsule (500 mcg total) by mouth 2 (two) times daily. 08/04/15  Yes Renee Dyane Dustman, PA-C  ferrous sulfate 325 (65 FE) MG tablet Take 325 mg by mouth daily with breakfast.   Yes Historical Provider, MD  hydrocortisone cream 0.5 % Apply 1 application topically as needed for itching.   Yes Historical Provider, MD  losartan (COZAAR) 100 MG tablet TAKE 1 TABLET DAILY 09/03/15  Yes Leonie Man, MD    magnesium hydroxide (MILK OF MAGNESIA) 400 MG/5ML suspension Take 30 mLs by mouth daily as needed for mild constipation.   Yes Historical Provider, MD  magnesium oxide (MAG-OX) 400 MG tablet Take 1 tablet (400 mg total) by mouth 2 (two) times daily. 08/19/15  Yes Sherran Needs, NP  metoprolol succinate (TOPROL-XL) 25 MG 24 hr tablet TAKE 1 TABLET DAILY 08/12/15  Yes Leonie Man, MD  mirabegron ER (MYRBETRIQ) 25 MG TB24 tablet Take 25 mg by mouth daily.    Yes Historical Provider, MD  nitroGLYCERIN (NITROSTAT) 0.4 MG SL tablet Place 0.4 mg under the tongue every 5 (five) minutes as needed for chest pain.    Yes Historical Provider, MD  omeprazole (PRILOSEC) 20 MG capsule Take 20 mg by mouth daily.   Yes Historical Provider, MD  Polyethylene Glycol 3350 (MIRALAX PO) Take 17 g by mouth daily as needed (Constipation).    Yes Historical Provider, MD  potassium chloride 20 MEQ TBCR Take 20 mEq by mouth daily. 07/16/15  Yes Sherran Needs, NP  tamsulosin (FLOMAX) 0.4 MG CAPS capsule Take 0.4 mg by mouth at bedtime.  06/24/15  Yes Historical Provider, MD   Allergies  Allergen Reactions  . Crab [Shellfish Allergy] Rash  . Hydrocodone Hives  . Adhesive [Tape] Rash  . Oxycodone Rash    Social History   Social History  . Marital Status: Married    Spouse Name: N/A  . Number of Children: N/A  . Years of Education: N/A   Occupational History  . Retired    Social History Main Topics  . Smoking status: Former Smoker    Types: Pipe    Quit date: 03/12/1993  . Smokeless tobacco: None     Comment: quit smoking pipe about 67yrs ago  . Alcohol Use: Yes     Comment: glass of wine daily  . Drug Use: No  . Sexual Activity: Not Currently   Other Topics Concern  . None   Social History Narrative   He is a married father of 2, grandfather of 2. He exercises at least 3 to 4 times a week but does something 6 to 7 days a week. He does not smoke. Drinks occasional alcohol.    History reviewed. No  pertinent family history.   Wt Readings from Last 3 Encounters:  09/24/15 211 lb (95.709 kg)  09/08/15 210 lb (95.255 kg)  08/19/15 218 lb (98.884 kg)    PHYSICAL EXAM BP 112/70 mmHg  Pulse 63  Ht 5\' 9"  (1.753 m)  Wt 211 lb (95.709 kg)  BMI 31.15 kg/m2 General appearance: alert, cooperative, appears stated age, no distress; mildly obese  HEENT: [place]/AT, EOMI, MMM, anicteric sclera  Neck: no adenopathy, no carotid bruit and no JVD  Lungs: CTA B., normal percussion bilaterally and non-labored  Heart: RRR, S1, S2 normal, no murmur, click, rub or gallop ; nondisplaced PMI  Abdomen: soft, non-tender; bowel sounds normal; no masses, no organomegaly; mildly obese (truncal) Extremities: extremities normal, atraumatic, no cyanosis, and edema roughly 1+ up to 2 + in some spots. spots.  Pulses: 2+ and symmetric; but mildly diminished in the legs.  Neurologic/Psych: Mental status: Alert, oriented X 3, thought content appropriate/ pleasant mood and affect; Cranial nerves: normal (II-XII grossly intact   Adult ECG Report  Rate: 63 ;  Rhythm: normal sinus rhythm, premature atrial contractions (PAC) and Inferior infarct, age undetermined with T wave inversions in lead III. Otherwise normal axis, intervals and durations.; Also noted was LVH with nonspecific ST and T-wave changes -- It appears that his inferior leads I REM normal to a reversed.  Narrative Interpretation: Otherwise stable EKG.   Other studies Reviewed: Additional studies/ records that were reviewed today include:  Recent Labs:     Chemistry      Component  Value Date/Time   NA 137 08/10/2015 1417   NA 139 08/10/2015   K 4.5 08/10/2015 1417   CL 104 08/10/2015 1417   CO2 25 08/10/2015 1417   BUN 18 08/10/2015 1417   BUN 19 08/10/2015   CREATININE 1.01 08/10/2015 1417   CREATININE 1.0 08/10/2015   CREATININE 1.06 06/09/2015 1628   GLU 127 08/10/2015      Component Value Date/Time   CALCIUM 8.8* 08/10/2015 1417    ALKPHOS 71 08/10/2015   AST 11* 08/10/2015   ALT 9* 08/10/2015   BILITOT 0.7 07/16/2015 1000     Lab Results  Component Value Date   CHOL 155 07/29/2014   HDL 51 07/29/2014   LDLCALC 83 07/29/2014   TRIG 105 07/29/2014   CHOLHDL 2.9 02/12/2014    ASSESSMENT / PLAN: Problem List Items Addressed This Visit    Pneumonia due to organism    Thankfully, and no significant residual effect of the pneumonia. No longer on antibiotics. He simply will take a long time to recover from this.      Persistent atrial fibrillation [facility])    Required chemical assisted cardioversion to maintain sinus rhythm. Now on Tikosyn and diltiazem as calcium channel blocker Anticoagulated with Eliquis.      Relevant Medications   furosemide (LASIX) 20 MG tablet   Paroxysmal (Persistent) atrial fibrillation (HCC) (Chronic)    Relatively new diagnosis of atrial fibrillation that was pretty persistent and recurrent. He is now on Tikosyn for rhythm control with diltiazem and metoprolol for rate control. Besides a little bit of fatigue, he seems to be doing quite well for having recovered from a pneumonia. He just feels weak and tired and was to get back to normal baseline. He seems to be doing okay with ELIQUIS and no bleeding issues.      Relevant Medications   furosemide (LASIX) 20 MG tablet   Myocardial infarction; history of (Chronic)    Distant history of MIs. Nothing recently. He tolerated several episodes of A. Fib. Is on relatively stable regimen. No recurrent significant angina. Current symptoms are probably related to A. fib and anxiousness.      Relevant Medications   furosemide (LASIX) 20 MG tablet   Hyperlipidemia with target LDL less than 70 (Chronic)    Atorvastatin. I don't have most recent labs.      Relevant Medications   furosemide (LASIX) 20 MG tablet   Exertional dyspnea and fatigue (Chronic)    Pretty much now getting close to his baseline. Related to obesity and deconditioning  and now recent hospital stay. He is hoping to get back into his physical therapy/rehabilitation as well as then getting back to his regular activity. I don't think he is overly symptomatic with the rate/rhythm control agents are on board. However we need to monitor for signs and medication related symptoms.  His weight is up a bit from last summer saw him. I'm wondering if he may have had some heart failure in response to his A. fib. I will start him on low-dose Lasix. He will take 1 dose daily and additional doses when necessary increased weight and/or dyspnea.      Essential hypertension (Chronic)    Blood pressure looks great today on current medications: Losartan, diltiazem and metoprolol/Lopressor      Relevant Medications   furosemide (LASIX) 20 MG tablet   CAD S/P percutaneous coronary angioplasty - Primary (Chronic)    Had been stable until he went A. fib. Interestingly, he has  not had a stress test to evaluate for ischemic etiology. We can discuss this and follow-up, but will be continue to monitor the extent of symptoms.      Relevant Medications   furosemide (LASIX) 20 MG tablet   Other Relevant Orders   EKG 12-Lead (Completed)      Current medicines are reviewed at length with the patient today. (+/- concerns) none The following changes have been made: non   ROV 3-4 months   Studies Ordered:   Orders Placed This Encounter  Procedures  . EKG 12-Lead      Leonie Man, M.D., M.S. Interventional Cardiologist   Pager # [phone] Phone # [phone] [address] [place], [place] [address]

## 2015-09-25 DIAGNOSIS — R739 Hyperglycemia, unspecified: Secondary | ICD-10-CM | POA: Diagnosis not present

## 2015-09-25 DIAGNOSIS — R531 Weakness: Secondary | ICD-10-CM | POA: Diagnosis not present

## 2015-09-29 ENCOUNTER — Encounter: Payer: Self-pay | Admitting: Physical Therapy

## 2015-09-29 ENCOUNTER — Ambulatory Visit: Payer: Medicare Other | Admitting: Physical Therapy

## 2015-09-29 DIAGNOSIS — M25562 Pain in left knee: Secondary | ICD-10-CM

## 2015-09-29 DIAGNOSIS — M6281 Muscle weakness (generalized): Secondary | ICD-10-CM

## 2015-09-29 DIAGNOSIS — M545 Low back pain, unspecified: Secondary | ICD-10-CM

## 2015-09-29 DIAGNOSIS — R2689 Other abnormalities of gait and mobility: Secondary | ICD-10-CM | POA: Diagnosis not present

## 2015-09-29 NOTE — Therapy (Signed)
[facility] [address] [place], [place], [address] Phone: [phone]   Fax:  [phone]  Physical Therapy Treatment  Patient Details  Name: Jerry [name] MRN: [id number] Date of Birth: [birth year] Referring Provider: Dr. Alroy Dust  Encounter Date: 09/29/2015      PT End of Session - 09/29/15 1113    Visit Number 2   Number of Visits 10   Date for PT Re-Evaluation 11/17/15   Authorization Type Medicare G codes;  KX at visit 15   PT Start Time 1100   PT Stop Time 1145   PT Time Calculation (min) 45 min   Activity Tolerance Patient tolerated treatment well      Past Medical History  Diagnosis Date  . Hypertension     Labile -- takes Verapamil,Losartan,and Metoprolol daily   . Peripheral edema     takes HCTZ daily  . Hyperlipidemia     takes Lipitor nightly  . CAD S/P percutaneous coronary angioplasty 1996, 2009, 01/2010    PTCA of L Cx 1996; BMS-RCA [facility]) 2009; Staged PCI RCA (70% ISR) --> 3.0 mm x 23 mm BMS, staged PCI Diag - 2.0 mm x 12 mm Mini Vision BMS (2.25 mm)  . Myocardial infarction [facility]) 1996, 2009    x 2;last time was about 8-10yrs ago   . Osteoarthritis of both knees   . Chronic back pain     scoliosis--pt states unable to have surgery bc of age  . GERD (gastroesophageal reflux disease)     takes Nexium daily  . History of colon polyps   . Urinary frequency     takes Vesicare and Flomax daily  . Urinary urgency   . Prostate cancer ([place]) 2005    seed implant  . Cataracts, bilateral     immature  . Anxiety     takes Valium prn  . Bleeding nose     occasionally  . Obesity (BMI 30.0-34.9)     Last BMI ~31;   . OSA (obstructive sleep apnea)   . Heart palpitations     PACs, short bursts of PSVT  . Physical deconditioning   . Persistent atrial fibrillation ([place])   . Pneumonia due to organism   . Leukocytosis   . Anemia   . Rash of back   . OAB (overactive bladder)     Past  Surgical History  Procedure Laterality Date  . Right knee arthrsoscopy  69yrs ago  . Cyst removed  in college  . Coronary angioplasty with stent placement  1996, 2009, August 2011    PTCA of L Cx 1996; BMS-RCA [facility]) 2009; Staged PCI RCA (70% ISR) --> 3.0 mm x 23 mm BMS, staged PCI Diag - 2.0 mm x 12 mm Mini Vision BMS (2.25 mm)  . Wisdom teeth extraccted    . Colonoscopy    . Seeds placed into prostate   2005  . Cholecystectomy    . Neuroplasty / transposition median nerve at carpal tunnel bilateral    . Right trigger finger    . Total knee arthroplasty  05/09/2012    Procedure: TOTAL KNEE ARTHROPLASTY;  Surgeon: Kerin Salen, MD;  Location: [place];  Service: Orthopedics;  Laterality: Right;  . Transthoracic echocardiogram  09/26/2012    EF 55-60%. Grade 2 diastolic dysfunction with moderate concentric LVH. Aortic sclerosis.  [name] Nm myoview ltd  November 2013    EF 53%; fixed mid inferior-inferolateral defect, infarct with no ischemia.  [name] Nm  myoview ltd  11/24/2010    ejection fraction 58%,left ventricle is normal size ,no evidence of inducible myocardial ischemia  . Cardioversion N/A 06/12/2015    Procedure: CARDIOVERSION;  Surgeon: Sanda Klein, MD;  [facility];  Service: Cardiovascular;  Laterality: N/A;  . Tee without cardioversion N/A 06/12/2015    Procedure: TRANSESOPHAGEAL ECHOCARDIOGRAM (TEE);  Surgeon: Sanda Klein, MD;  Location: [place];  Service: Cardiovascular;  Laterality: N/A;  . Cardioversion N/A 08/02/2015    Procedure: CARDIOVERSION;  Surgeon: [name], MD;  [facility];  Service: Cardiovascular;  Laterality: N/A;    There were no vitals filed for this visit.      Subjective Assessment - 09/29/15 1110    Subjective Pt using his SPC for ambulation, has no major discomfort today, but reports his left knee is easily aggrivated with exercises. Pt is planning to purchase a pulsoc   Patient is accompained by: Family member   Pertinent History A  fib;  no  cardiac implants; cardioversion;  hx of prostrate CA    Limitations Walking;House hold activities   How long can you stand comfortably? unable to stand to shave or make a sandwich   How long can you walk comfortably? 300 feet with shortness of breath and low back pain   Patient Stated Goals be able to have more stamina;  participate in household chores (plan and cook meals);  get in /out of the car and walk into church, cinema, symphony   Currently in Pain? No/denies                         [facility] Adult PT Treatment/Exercise - 09/29/15 0001    Bed Mobility   Bed Mobility --  Pt wishes to be called "Jerry [name]"   Standardized Balance Assessment   Standardized Balance Assessment Berg Balance Test   Berg Balance Test   Sit to Stand Able to stand without using hands and stabilize independently   Standing Unsupported Able to stand safely 2 minutes  pt starts to feel his back pain at end of two min   Sitting with Back Unsupported but Feet Supported on Floor or Stool Able to sit safely and securely 2 minutes   Stand to Sit Sits safely with minimal use of hands   Transfers Able to transfer safely, minor use of hands   Standing Unsupported with Eyes Closed Able to stand 10 seconds safely   Standing Ubsupported with Feet Together Able to place feet together independently and stand 1 minute safely   From Standing, Reach Forward with Outstretched Arm Can reach forward >12 cm safely (5")   From Standing Position, Pick up Object from Floor Able to pick up shoe, needs supervision   From Standing Position, Turn to Look Behind Over each Shoulder Looks behind one side only/other side shows less weight shift   Turn 360 Degrees Able to turn 360 degrees safely but slowly   Standing Unsupported, Alternately Place Feet on Step/Stool Able to complete >2 steps/needs minimal assist  pt able to perform in 20sec, but with supervision for safety   Standing Unsupported, One Foot in [employer]  balance while stepping or standing   Standing on One Leg Tries to lift leg/unable to hold 3 seconds but remains standing independently   Total Score 41   Exercises   Exercises Lumbar;Knee/Hip   Lumbar Exercises: Seated   Other Seated Lumbar Exercises Shelbie Ammons with yellow plyball x 10 each side  Knee/Hip Exercises: Aerobic   Nustep L1 x 30min 0.14mph, seat and UE #9                  PT Short Term Goals - 09/29/15 1121    PT SHORT TERM GOAL #1   Title The patient will have improved HS muscle length to 70 degrees needed for greater ease getting in and out of the car.  10/20/15   Time 4   Period Weeks   Status On-going   PT SHORT TERM GOAL #2   Title The patient will have improved trunk and LE strength be able to stand 3 min for grooming, dressing tasks   Time 4   Period Weeks   Status On-going   PT SHORT TERM GOAL #3   Title The patient will be able to ambulate 500 feet with SPC and min to moderate back and knee pain needed for short distance community ambulation to church, [employer], symphony   Time 4   Period Weeks   Status On-going   PT SHORT TERM GOAL #4   Title The patient will have improved hip flexor muscle length to 10 degrees needed for walking longer distances with decreased energy expenditure   Time 4   Period Weeks   Status On-going   PT SHORT TERM GOAL #5   Title Timed up and go test improved to 14 sec indicating improved gait speed and safety   Time 4   Period Weeks   Status On-going           PT Long Term Goals - 09/22/15 1537    PT LONG TERM GOAL #1   Title The patient will be independent with safe self progression of HEP to improved walking distance and further conditioning   11/17/15   Time 8   Period Weeks   Status New   PT LONG TERM GOAL #2   Title The patient will have improved core and hip strength to grossly 4+/5 needed to stand for 5 min for light cooking   Time 8   Period Weeks   Status New   PT LONG TERM GOAL #3   Title  The patient will be able to ambulate 700 feet with SPC with min to moderate back and knee pain for ambulating longer community distances to the symphony, church and [employer].     Time 8   Period Weeks   Status New   PT LONG TERM GOAL #4   Title The patient will have improved Timed up and Go test to 12 sec indicating improved gait speed and safety    Time 8   Period Weeks   Status New   PT LONG TERM GOAL #5   Title Patient will have improved LE strength needed for 5x sit to stand test < 13 sec   Time 8   Period Weeks   Status New   Additional Long Term Goals   Additional Long Term Goals Yes   PT LONG TERM GOAL #6   Title BERG balance test of at least 42/56 indicating lower risk of falls    Time 8   Period Weeks   Status New               Plan - 09/29/15 1113    Clinical Impression Statement Pt with increased ER of left leg in standing and walking. Pt's O2 was 93% while on Nu-step and at end of session. BERG balance test pt scored 41 points  out of 56. Pt will benefit from skilled Pt to improve balance, endurance and strength.   Rehab Potential Good   Clinical Impairments Affecting Rehab Potential A fib;  Right TKR; lumbar spinal stenosis   PT Frequency 2x / week   PT Duration 8 weeks   PT Treatment/Interventions ADLs/Self Care Home Management;Electrical Stimulation;Moist Heat;Therapeutic exercise;Ultrasound;Patient/family education;Manual techniques;Taping;Dry needling;Gait training;Functional mobility training;Neuromuscular re-education   PT Next Visit Plan 6MWT or 45min walk test, low level HS and psoas stretch, gait with SPC with O2 monitoring, standign balance, abdominal bracing   Consulted and Agree with Plan of Care Patient      Patient will benefit from skilled therapeutic intervention in order to improve the following deficits and impairments:  Pain, Decreased balance, Decreased activity tolerance, Decreased strength, Decreased mobility, Difficulty  walking  Visit Diagnosis: Muscle weakness (generalized)  Other abnormalities of gait and mobility  Bilateral low back pain without sciatica  Pain in left knee     Problem List Patient Active Problem List   Diagnosis Date Noted  . Pneumonia   . Pneumonia due to organism   . Fever   . Acute respiratory failure ([place])   . Persistent atrial fibrillation ([place])   . Atrial fibrillation ([place]) 06/09/2015  . Exertional dyspnea and fatigue 08/13/2013  . CAD S/P percutaneous coronary angioplasty   . Hyperlipidemia with target LDL less than 70   . Essential hypertension   . Obesity (BMI 30.0-34.9)   . Myocardial infarction; history of   . Osteoarthritis of right knee 05/11/2012    Linea Calles Naumann-Houegnifio, PTA 09/29/2015 12:00 PM  [facility] [address], [place], [place], [address] Phone: [phone]   Fax:  [phone]  Name: Jerry [name] MRN: [id number] Date of Birth: 06/23/1929

## 2015-10-01 ENCOUNTER — Other Ambulatory Visit: Payer: Self-pay | Admitting: Cardiology

## 2015-10-01 ENCOUNTER — Ambulatory Visit: Payer: Medicare Other | Admitting: Physical Therapy

## 2015-10-01 ENCOUNTER — Encounter: Payer: Self-pay | Admitting: Cardiology

## 2015-10-01 ENCOUNTER — Telehealth: Payer: Self-pay | Admitting: Cardiology

## 2015-10-01 DIAGNOSIS — M6281 Muscle weakness (generalized): Secondary | ICD-10-CM | POA: Diagnosis not present

## 2015-10-01 DIAGNOSIS — I480 Paroxysmal atrial fibrillation: Secondary | ICD-10-CM

## 2015-10-01 DIAGNOSIS — M545 Low back pain, unspecified: Secondary | ICD-10-CM

## 2015-10-01 DIAGNOSIS — R2689 Other abnormalities of gait and mobility: Secondary | ICD-10-CM | POA: Diagnosis not present

## 2015-10-01 DIAGNOSIS — M25562 Pain in left knee: Secondary | ICD-10-CM | POA: Diagnosis not present

## 2015-10-01 NOTE — Assessment & Plan Note (Addendum)
Pretty much now getting close to his baseline. Related to obesity and deconditioning and now recent hospital stay. He is hoping to get back into his physical therapy/rehabilitation as well as then getting back to his regular activity. I don't think he is overly symptomatic with the rate/rhythm control agents are on board. However we need to monitor for signs and medication related symptoms.  His weight is up a bit from last summer saw him. I'm wondering if he may have had some heart failure in response to his A. fib. I will start him on low-dose Lasix. He will take 1 dose daily and additional doses when necessary increased weight and/or dyspnea.

## 2015-10-01 NOTE — Assessment & Plan Note (Signed)
Thankfully, and no significant residual effect of the pneumonia. No longer on antibiotics. He simply will take a long time to recover from this.

## 2015-10-01 NOTE — Telephone Encounter (Signed)
thnx  Leonie Man, MD

## 2015-10-01 NOTE — Telephone Encounter (Signed)
Spoke with pt wife, pt will call back with medication clarification if he is taking Nexium or Omeprazole

## 2015-10-01 NOTE — Assessment & Plan Note (Signed)
Relatively new diagnosis of atrial fibrillation that was pretty persistent and recurrent. He is now on Tikosyn for rhythm control with diltiazem and metoprolol for rate control. Besides a little bit of fatigue, he seems to be doing quite well for having recovered from a pneumonia. He just feels weak and tired and was to get back to normal baseline. He seems to be doing okay with ELIQUIS and no bleeding issues.

## 2015-10-01 NOTE — Assessment & Plan Note (Signed)
Atorvastatin. I don't have most recent labs.

## 2015-10-01 NOTE — Assessment & Plan Note (Signed)
Required chemical assisted cardioversion to maintain sinus rhythm. Now on Tikosyn and diltiazem as calcium channel blocker Anticoagulated with Eliquis.

## 2015-10-01 NOTE — Assessment & Plan Note (Signed)
Distant history of MIs. Nothing recently. He tolerated several episodes of A. Fib. Is on relatively stable regimen. No recurrent significant angina. Current symptoms are probably related to A. fib and anxiousness.

## 2015-10-01 NOTE — Telephone Encounter (Signed)
REFILL 

## 2015-10-01 NOTE — Therapy (Signed)
[facility] [address] [place], [place], [address] Phone: [phone]   Fax:  [phone]  Physical Therapy Treatment  Patient Details  Name: Jerry [name] MRN: [id number] Date of Birth: [birth year] Referring Provider: Dr. Alroy Dust  Encounter Date: 10/01/2015      PT End of Session - 10/01/15 1104    Visit Number 3   Number of Visits 10   Date for PT Re-Evaluation 11/17/15   Authorization Type Medicare G codes;  KX at visit 15   PT Start Time 1015   PT Stop Time 1055   PT Time Calculation (min) 40 min   Activity Tolerance Patient tolerated treatment well      Past Medical History  Diagnosis Date  . Hypertension     Labile -- takes Verapamil,Losartan,and Metoprolol daily   . Peripheral edema     takes HCTZ daily  . Hyperlipidemia     takes Lipitor nightly  . CAD S/P percutaneous coronary angioplasty 1996, 2009, 01/2010    PTCA of L Cx 1996; BMS-RCA [facility]) 2009; Staged PCI RCA (70% ISR) --> 3.0 mm x 23 mm BMS, staged PCI Diag - 2.0 mm x 12 mm Mini Vision BMS (2.25 mm)  . Myocardial infarction [facility]) 1996, 2009    x 2;last time was about 80-28yrs ago   . Osteoarthritis of both knees   . Chronic back pain     scoliosis--pt states unable to have surgery bc of age  . GERD (gastroesophageal reflux disease)     takes Nexium daily  . History of colon polyps   . Urinary frequency     takes Vesicare and Flomax daily  . Urinary urgency   . Prostate cancer ([place]) 2005    seed implant  . Cataracts, bilateral     immature  . Anxiety     takes Valium prn  . Bleeding nose     occasionally  . Obesity (BMI 30.0-34.9)     Last BMI ~80;   . OSA (obstructive sleep apnea)   . Heart palpitations     PACs, short bursts of PSVT  . Physical deconditioning   . Persistent atrial fibrillation ([place])   . Pneumonia due to organism   . Leukocytosis   . Anemia   . Rash of back   . OAB (overactive bladder)     Past  Surgical History  Procedure Laterality Date  . Right knee arthrsoscopy  80yrs ago  . Cyst removed  in college  . Coronary angioplasty with stent placement  1996, 2009, August 2011    PTCA of L Cx 1996; BMS-RCA [facility]) 2009; Staged PCI RCA (70% ISR) --> 3.0 mm x 23 mm BMS, staged PCI Diag - 2.0 mm x 12 mm Mini Vision BMS (2.25 mm)  . Wisdom teeth extraccted    . Colonoscopy    . Seeds placed into prostate   2005  . Cholecystectomy    . Neuroplasty / transposition median nerve at carpal tunnel bilateral    . Right trigger finger    . Total knee arthroplasty  05/09/2012    Procedure: TOTAL KNEE ARTHROPLASTY;  Surgeon: Kerin Salen, MD;  Location: [place];  Service: Orthopedics;  Laterality: Right;  . Transthoracic echocardiogram  09/26/2012    EF 55-60%. Grade 2 diastolic dysfunction with moderate concentric LVH. Aortic sclerosis.  [name] Nm myoview ltd  November 2013    EF 53%; fixed mid inferior-inferolateral defect, infarct with no ischemia.  [name] Nm  myoview ltd  11/24/2010    ejection fraction 58%,left ventricle is normal size ,no evidence of inducible myocardial ischemia  . Cardioversion N/A 06/12/2015    Procedure: CARDIOVERSION;  Surgeon: Sanda Klein, MD;  [facility];  Service: Cardiovascular;  Laterality: N/A;  . Tee without cardioversion N/A 06/12/2015    Procedure: TRANSESOPHAGEAL ECHOCARDIOGRAM (TEE);  Surgeon: Sanda Klein, MD;  Location: [place];  Service: Cardiovascular;  Laterality: N/A;  . Cardioversion N/A 08/02/2015    Procedure: CARDIOVERSION;  Surgeon: [name], MD;  Location: [facility];  Service: Cardiovascular;  Laterality: N/A;    There were no vitals filed for this visit.      Subjective Assessment - 10/01/15 1018    Subjective My right shoulder is testy at time.  Denies overfatigue from last visit.  Using his SPC at home.  Got a pulse ox for home and it goes down to 88% but gets back to 95% quickly.     Currently in Pain? No/denies             [facility] PT Assessment - 10/01/15 0001    6 Minute Walk- Baseline   6 Minute Walk- Baseline yes  2 rest breaks 320 feet    02 Sat (%RA) 93 %   Perceived Rate of Exertion ([place]) 13- Somewhat hard                     [facility] Adult PT Treatment/Exercise - 10/01/15 0001    Lumbar Exercises: Seated   Other Seated Lumbar Exercises core low level red plyo ball shoulder to shoulder, Charlie browns, chops 30 sec each   Knee/Hip Exercises: Stretches   Other Knee/Hip Stretches doorway psoas stretches 2x 5 R/L  difficulty with arms on doorframe shoulder pain   Knee/Hip Exercises: Aerobic   Nustep L1 x 28min 0.43mph, seat and UE #9   Knee/Hip Exercises: Standing   Heel Raises Both;10 reps   Hip ADduction AROM;Right;Left;10 reps   Hip Extension AROM;Right;Left;10 reps   Other Standing Knee Exercises step taps 10x alternating   Knee/Hip Exercises: Seated   Long Arc Quad Strengthening;Right;Left;10 reps  red band   Clamshell with TheraBand Red  3x10   Sit to Sand 1 set;10 reps;without UE support  very high mat table cues to hold head up                  PT Short Term Goals - 10/01/15 1207    PT SHORT TERM GOAL #1   Title The patient will have improved HS muscle length to 70 degrees needed for greater ease getting in and out of the car.  10/20/15   Time 4   Period Weeks   Status On-going   PT SHORT TERM GOAL #2   Title The patient will have improved trunk and LE strength be able to stand 3 min for grooming, dressing tasks   Time 4   Period Weeks   Status On-going   PT SHORT TERM GOAL #3   Title The patient will be able to ambulate 500 feet with SPC and min to moderate back and knee pain needed for short distance community ambulation to church, Gaffer, symphony   Time 4   Period Weeks   Status On-going   PT SHORT TERM GOAL #4   Title The patient will have improved hip flexor muscle length to 10 degrees needed for walking longer distances with decreased energy  expenditure   Time 4   Period Weeks  Status On-going   PT SHORT TERM GOAL #5   Title Timed up and go test improved to 14 sec indicating improved gait speed and safety   Time 4   Period Weeks   Status On-going           PT Long Term Goals - 10/01/15 1207    PT LONG TERM GOAL #1   Title The patient will be independent with safe self progression of HEP to improved walking distance and further conditioning   11/17/15   Time 8   Period Weeks   Status On-going   PT LONG TERM GOAL #2   Title The patient will have improved core and hip strength to grossly 4+/5 needed to stand for 5 min for light cooking   Time 8   Period Weeks   Status On-going   PT LONG TERM GOAL #3   Title The patient will be able to ambulate 700 feet with SPC with min to moderate back and knee pain for ambulating longer community distances to the symphony, church and [employer].     Time 8   Period Weeks   Status On-going   PT LONG TERM GOAL #4   Title The patient will have improved Timed up and Go test to 12 sec indicating improved gait speed and safety    Time 8   Period Weeks   Status On-going   PT LONG TERM GOAL #5   Title Patient will have improved LE strength needed for 5x sit to stand test < 13 sec   Time 8   Period Weeks   Status On-going   PT LONG TERM GOAL #6   Title BERG balance test of at least 42/56 indicating lower risk of falls    Time 8   Period Weeks   Status On-going               Plan - 10/01/15 1104    Clinical Impression Statement Six minute walk test completed 320  (normal for age 82 feet) with 2 sitting rest breaks needed, O2 sats remain above 90% but moderate shortness of breath and perceived exertion.  The patient does have a slight drop in O2 sats to 88% with standing 3 min.  Therapist closely monitoring and modifying for shortness of breath, back pain and safety.     PT Next Visit Plan standing LE strengthening; HS and psoas stretch on stairs; weight shifting; gait  with SPC with O2 monitoring; balance       Patient will benefit from skilled therapeutic intervention in order to improve the following deficits and impairments:     Visit Diagnosis: Muscle weakness (generalized)  Other abnormalities of gait and mobility  Bilateral low back pain without sciatica  Pain in left knee     Problem List Patient Active Problem List   Diagnosis Date Noted  . Paroxysmal (Persistent) atrial fibrillation ([place]) 10/01/2015  . Pneumonia   . Pneumonia due to organism   . Fever   . Acute respiratory failure ([place])   . Persistent atrial fibrillation ([place])   . Exertional dyspnea and fatigue 08/13/2013  . CAD S/P percutaneous coronary angioplasty   . Hyperlipidemia with target LDL less than 70   . Essential hypertension   . Obesity (BMI 30.0-34.9)   . Myocardial infarction; history of   . Osteoarthritis of right knee 05/11/2012   Ruben Im, PT 10/01/2015 12:12 PM Phone: [phone] Fax: [phone]   Alvera Singh 10/01/2015, 12:12 PM  [facility] [address], [place], [place], [address] Phone: [phone]   Fax:  [phone]  Name: DERICO MCCREA MRN: [id number] Date of Birth: 12-29-1929

## 2015-10-01 NOTE — Telephone Encounter (Signed)
Patient called in asking for triage - call patched directly thru.  Patient states he had a message per his wife to return a call to [facility] to provide info as to what PPI he was taking.  Both prilosec and nexium were on his list Per patient he takes nexium 40mg  once daily Med list updated Routed to MD as Juluis Rainier

## 2015-10-01 NOTE — Assessment & Plan Note (Signed)
Had been stable until he went A. fib. Interestingly, he has not had a stress test to evaluate for ischemic etiology. We can discuss this and follow-up, but will be continue to monitor the extent of symptoms.

## 2015-10-01 NOTE — Assessment & Plan Note (Signed)
Blood pressure looks great today on current medications: Losartan, diltiazem and metoprolol/Lopressor

## 2015-10-05 DIAGNOSIS — Z8546 Personal history of malignant neoplasm of prostate: Secondary | ICD-10-CM | POA: Diagnosis not present

## 2015-10-06 ENCOUNTER — Ambulatory Visit: Payer: Medicare Other | Attending: Family Medicine | Admitting: Physical Therapy

## 2015-10-06 DIAGNOSIS — R2689 Other abnormalities of gait and mobility: Secondary | ICD-10-CM

## 2015-10-06 DIAGNOSIS — M25562 Pain in left knee: Secondary | ICD-10-CM

## 2015-10-06 DIAGNOSIS — M6281 Muscle weakness (generalized): Secondary | ICD-10-CM | POA: Diagnosis not present

## 2015-10-06 DIAGNOSIS — M545 Low back pain, unspecified: Secondary | ICD-10-CM

## 2015-10-06 NOTE — Therapy (Signed)
[facility] [address] [place], [place], [address] Phone: [phone]   Fax:  [phone]  Physical Therapy Treatment  Patient Details  Name: Jerry [name] MRN: [id number] Date of Birth: 1930/03/03 Referring Provider: Dr. Alroy Dust  Encounter Date: 10/06/2015      PT End of Session - 10/06/15 1047    Visit Number 4   Number of Visits 10   Date for PT Re-Evaluation 11/17/15   Authorization Type Medicare G codes;  KX at visit 15   PT Start Time 1005   PT Stop Time 1053   PT Time Calculation (min) 48 min   Activity Tolerance Patient limited by fatigue  shortness of breath      Past Medical History  Diagnosis Date  . Hypertension     Labile -- takes Verapamil,Losartan,and Metoprolol daily   . Peripheral edema     takes HCTZ daily  . Hyperlipidemia     takes Lipitor nightly  . CAD S/P percutaneous coronary angioplasty 1996, 2009, 01/2010    PTCA of L Cx 1996; BMS-RCA [facility]) 2009; Staged PCI RCA (70% ISR) --> 3.0 mm x 23 mm BMS, staged PCI Diag - 2.0 mm x 12 mm Mini Vision BMS (2.25 mm)  . Myocardial infarction [facility]) 1996, 2009    x 2;last time was about 8-78yrs ago   . Osteoarthritis of both knees   . Chronic back pain     scoliosis--pt states unable to have surgery bc of age  . GERD (gastroesophageal reflux disease)     takes Nexium daily  . History of colon polyps   . Urinary frequency     takes Vesicare and Flomax daily  . Urinary urgency   . Prostate cancer ([place]) 2005    seed implant  . Cataracts, bilateral     immature  . Anxiety     takes Valium prn  . Bleeding nose     occasionally  . Obesity (BMI 30.0-34.9)     Last BMI ~31;   . OSA (obstructive sleep apnea)   . Heart palpitations     PACs, short bursts of PSVT  . Physical deconditioning   . Persistent atrial fibrillation ([place])   . Pneumonia due to organism   . Leukocytosis   . Anemia   . Rash of back   . OAB (overactive  bladder)     Past Surgical History  Procedure Laterality Date  . Right knee arthrsoscopy  63yrs ago  . Cyst removed  in college  . Coronary angioplasty with stent placement  1996, 2009, August 2011    PTCA of L Cx 1996; BMS-RCA [facility]) 2009; Staged PCI RCA (70% ISR) --> 3.0 mm x 23 mm BMS, staged PCI Diag - 2.0 mm x 12 mm Mini Vision BMS (2.25 mm)  . Wisdom teeth extraccted    . Colonoscopy    . Seeds placed into prostate   2005  . Cholecystectomy    . Neuroplasty / transposition median nerve at carpal tunnel bilateral    . Right trigger finger    . Total knee arthroplasty  05/09/2012    Procedure: TOTAL KNEE ARTHROPLASTY;  Surgeon: Kerin Salen, MD;  Location: [place];  Service: Orthopedics;  Laterality: Right;  . Transthoracic echocardiogram  09/26/2012    EF 55-60%. Grade 2 diastolic dysfunction with moderate concentric LVH. Aortic sclerosis.  [name] Nm myoview ltd  November 2013    EF 53%; fixed mid inferior-inferolateral defect, infarct with no  ischemia.  [name] Nm myoview ltd  11/24/2010    ejection fraction 58%,left ventricle is normal size ,no evidence of inducible myocardial ischemia  . Cardioversion N/A 06/12/2015    Procedure: CARDIOVERSION;  Surgeon: Sanda Klein, MD;  [facility];  Service: Cardiovascular;  Laterality: N/A;  . Tee without cardioversion N/A 06/12/2015    Procedure: TRANSESOPHAGEAL ECHOCARDIOGRAM (TEE);  Surgeon: Sanda Klein, MD;  Location: [place];  Service: Cardiovascular;  Laterality: N/A;  . Cardioversion N/A 08/02/2015    Procedure: CARDIOVERSION;  Surgeon: [name], MD;  Location: [facility];  Service: Cardiovascular;  Laterality: N/A;    There were no vitals filed for this visit.      Subjective Assessment - 10/06/15 1008    Subjective Denies excessive fatigue following last treatment session and Kiwana's meeting.  Left knee achiness and shoulder bothering me today.     Currently in Pain? Yes   Pain Score 3    Pain Location Knee   Pain  Orientation Left   Pain Frequency Constant   Pain Score 5   Pain Location Shoulder   Pain Orientation Right;Left                         [facility] Adult PT Treatment/Exercise - 10/06/15 0001    Ambulation/Gait   Ambulation/Gait Yes   Ambulation/Gait Assistance 5: Supervision   Ambulation/Gait Assistance Details --  1 min and 15 sec increments x3    Assistive device Straight cane   Gait Pattern Decreased stride length   Gait Comments cues to hold head up   Lumbar Exercises: Seated   Other Seated Lumbar Exercises core red plyo ball hip swings, chops, Charlie Browns 1 min each   Knee/Hip Exercises: Stretches   Active Hamstring Stretch Right;Left;2 reps;30 seconds   Hip Flexor Stretch Right;Left;1 rep;20 seconds  on stairs   Knee/Hip Exercises: Aerobic   Nustep L2 10 min   Knee/Hip Exercises: Standing   Heel Raises Both;10 reps   Knee/Hip Exercises: Seated   Long Arc Quad Strengthening;Right;Left;10 reps  red band   Clamshell with TheraBand Green  3x 10   Other Seated Knee/Hip Exercises red band ankle DF with hip flex 10x R/L                  PT Short Term Goals - 10/06/15 2014    PT SHORT TERM GOAL #1   Title The patient will have improved HS muscle length to 70 degrees needed for greater ease getting in and out of the car.  10/20/15   Time 4   Period Weeks   Status On-going   PT SHORT TERM GOAL #2   Title The patient will have improved trunk and LE strength be able to stand 3 min for grooming, dressing tasks   Time 4   Period Weeks   Status On-going   PT SHORT TERM GOAL #3   Title The patient will be able to ambulate 500 feet with SPC and min to moderate back and knee pain needed for short distance community ambulation to church, [employer], symphony   Time 4   Period Weeks   Status On-going   PT SHORT TERM GOAL #4   Title The patient will have improved hip flexor muscle length to 10 degrees needed for walking longer distances with decreased  energy expenditure   Time 4   Period Weeks   Status On-going   PT SHORT TERM GOAL #5   Title  Timed up and go test improved to 14 sec indicating improved gait speed and safety   Time 4   Period Weeks   Status On-going           PT Long Term Goals - 10/06/15 2014    PT LONG TERM GOAL #1   Title The patient will be independent with safe self progression of HEP to improved walking distance and further conditioning   11/17/15   Time 8   Period Weeks   Status On-going   PT LONG TERM GOAL #2   Title The patient will have improved core and hip strength to grossly 4+/5 needed to stand for 5 min for light cooking   Time 8   Period Weeks   Status On-going   PT LONG TERM GOAL #3   Title The patient will be able to ambulate 700 feet with SPC with min to moderate back and knee pain for ambulating longer community distances to the symphony, church and [employer].     Time 8   Period Weeks   Status On-going   PT LONG TERM GOAL #4   Title The patient will have improved Timed up and Go test to 12 sec indicating improved gait speed and safety    Time 8   Period Weeks   Status On-going   PT LONG TERM GOAL #5   Title Patient will have improved LE strength needed for 5x sit to stand test < 13 sec   Time 8   Period Weeks   Status On-going   PT LONG TERM GOAL #6   Title BERG balance test of at least 42/56 indicating lower risk of falls    Time 8   Period Weeks   Status On-going               Plan - 10/06/15 1048    Clinical Impression Statement Patient fatigues quickly in both standing and walking.  Needs sitting rest break every 1 min, 15 sec with O2 sats ranging from 88% but recovers to 94% or higher quickly.  Keeps left LE in externally rotated position.  Needs near constant reminders to avoid looking down while ambulating.  Close supervision for respiratory monitoring and safety.  Needs continued PT to progress strengthening and endurance needed for return to community  activity.     PT Next Visit Plan standing LE strengthening; HS and psoas stretch on stairs; weight shifting; gait with SPC with O2 monitoring; balance       Patient will benefit from skilled therapeutic intervention in order to improve the following deficits and impairments:     Visit Diagnosis: Muscle weakness (generalized)  Other abnormalities of gait and mobility  Bilateral low back pain without sciatica  Pain in left knee     Problem List Patient Active Problem List   Diagnosis Date Noted  . Paroxysmal (Persistent) atrial fibrillation ([place]) 10/01/2015  . Pneumonia   . Pneumonia due to organism   . Fever   . Acute respiratory failure ([place])   . Persistent atrial fibrillation ([place])   . Exertional dyspnea and fatigue 08/13/2013  . CAD S/P percutaneous coronary angioplasty   . Hyperlipidemia with target LDL less than 70   . Essential hypertension   . Obesity (BMI 30.0-34.9)   . Myocardial infarction; history of   . Osteoarthritis of right knee 05/11/2012     Ruben Im, PT 10/06/2015 8:17 PM Phone: [phone] Fax: [phone] Alvera Singh 10/06/2015, 8:16 PM  [facility] [address] [place], [place], [address] Phone: [phone]   Fax:  [phone]  Name: Jerry [name] MRN: [id number] Date of Birth: July 14, 1929

## 2015-10-09 ENCOUNTER — Ambulatory Visit: Payer: Medicare Other | Admitting: Physical Therapy

## 2015-10-09 DIAGNOSIS — M545 Low back pain, unspecified: Secondary | ICD-10-CM

## 2015-10-09 DIAGNOSIS — M25562 Pain in left knee: Secondary | ICD-10-CM | POA: Diagnosis not present

## 2015-10-09 DIAGNOSIS — Z8546 Personal history of malignant neoplasm of prostate: Secondary | ICD-10-CM | POA: Diagnosis not present

## 2015-10-09 DIAGNOSIS — R2689 Other abnormalities of gait and mobility: Secondary | ICD-10-CM

## 2015-10-09 DIAGNOSIS — M6281 Muscle weakness (generalized): Secondary | ICD-10-CM | POA: Diagnosis not present

## 2015-10-09 DIAGNOSIS — Z0000 Encounter for general adult medical examination without abnormal findings: Secondary | ICD-10-CM | POA: Diagnosis not present

## 2015-10-09 DIAGNOSIS — N4340 Spermatocele of epididymis, unspecified: Secondary | ICD-10-CM | POA: Diagnosis not present

## 2015-10-09 NOTE — Therapy (Signed)
[facility] [address] [place], [place], [address] Phone: [phone]   Fax:  [phone]  Physical Therapy Treatment  Patient Details  Name: Jerry [name] MRN: [id number] Date of Birth: 1929-11-22 Referring Provider: Dr. Alroy Dust  Encounter Date: 10/09/2015      PT End of Session - 10/09/15 1126    Visit Number 5   Number of Visits 10   Date for PT Re-Evaluation 11/17/15   Authorization Type Medicare G codes;  KX at visit 15   PT Start Time 1058   PT Stop Time 1145   PT Time Calculation (min) 47 min   Activity Tolerance Patient limited by fatigue  pt with low 02 sats, average 90 to 93%      Past Medical History  Diagnosis Date  . Hypertension     Labile -- takes Verapamil,Losartan,and Metoprolol daily   . Peripheral edema     takes HCTZ daily  . Hyperlipidemia     takes Lipitor nightly  . CAD S/P percutaneous coronary angioplasty 1996, 2009, 01/2010    PTCA of L Cx 1996; BMS-RCA [facility]) 2009; Staged PCI RCA (70% ISR) --> 3.0 mm x 23 mm BMS, staged PCI Diag - 2.0 mm x 12 mm Mini Vision BMS (2.25 mm)  . Myocardial infarction [facility]) 1996, 2009    x 2;last time was about 8-72yrs ago   . Osteoarthritis of both knees   . Chronic back pain     scoliosis--pt states unable to have surgery bc of age  . GERD (gastroesophageal reflux disease)     takes Nexium daily  . History of colon polyps   . Urinary frequency     takes Vesicare and Flomax daily  . Urinary urgency   . Prostate cancer ([place]) 2005    seed implant  . Cataracts, bilateral     immature  . Anxiety     takes Valium prn  . Bleeding nose     occasionally  . Obesity (BMI 30.0-34.9)     Last BMI ~31;   . OSA (obstructive sleep apnea)   . Heart palpitations     PACs, short bursts of PSVT  . Physical deconditioning   . Persistent atrial fibrillation ([place])   . Pneumonia due to organism   . Leukocytosis   . Anemia   . Rash of back   . OAB  (overactive bladder)     Past Surgical History  Procedure Laterality Date  . Right knee arthrsoscopy  97yrs ago  . Cyst removed  in college  . Coronary angioplasty with stent placement  1996, 2009, August 2011    PTCA of L Cx 1996; BMS-RCA [facility]) 2009; Staged PCI RCA (70% ISR) --> 3.0 mm x 23 mm BMS, staged PCI Diag - 2.0 mm x 12 mm Mini Vision BMS (2.25 mm)  . Wisdom teeth extraccted    . Colonoscopy    . Seeds placed into prostate   2005  . Cholecystectomy    . Neuroplasty / transposition median nerve at carpal tunnel bilateral    . Right trigger finger    . Total knee arthroplasty  05/09/2012    Procedure: TOTAL KNEE ARTHROPLASTY;  Surgeon: Kerin Salen, MD;  Location: [place];  Service: Orthopedics;  Laterality: Right;  . Transthoracic echocardiogram  09/26/2012    EF 55-60%. Grade 2 diastolic dysfunction with moderate concentric LVH. Aortic sclerosis.  [name] Nm myoview ltd  November 2013    EF 53%; fixed  mid inferior-inferolateral defect, infarct with no ischemia.  [name] Nm myoview ltd  11/24/2010    ejection fraction 58%,left ventricle is normal size ,no evidence of inducible myocardial ischemia  . Cardioversion N/A 06/12/2015    Procedure: CARDIOVERSION;  Surgeon: Sanda Klein, MD;  [facility];  Service: Cardiovascular;  Laterality: N/A;  . Tee without cardioversion N/A 06/12/2015    Procedure: TRANSESOPHAGEAL ECHOCARDIOGRAM (TEE);  Surgeon: Sanda Klein, MD;  Location: [place];  Service: Cardiovascular;  Laterality: N/A;  . Cardioversion N/A 08/02/2015    Procedure: CARDIOVERSION;  Surgeon: [name], MD;  Location[facility];  Service: Cardiovascular;  Laterality: N/A;    There were no vitals filed for this visit.      Subjective Assessment - 10/09/15 1114    Subjective Pt wishes to work more on balance, pt feels he is still weak, but noticed he is able to perform more activities   Patient is accompained by: Family member   Pertinent History A fib;  no  cardiac  implants; cardioversion;  hx of prostrate CA    Limitations Walking;House hold activities   How long can you stand comfortably? unable to stand to shave or make a sandwich   How long can you walk comfortably? 300 feet with shortness of breath and low back pain   Patient Stated Goals be able to have more stamina;  participate in household chores (plan and cook meals);  get in /out of the car and walk into church, cinema, symphony   Currently in Pain? Yes   Pain Score 3    Pain Location Knee   Pain Orientation Left   Pain Descriptors / Indicators Aching   Pain Type Chronic pain   Pain Onset Other (comment)   Pain Score 2   Pain Location Shoulder   Pain Orientation Right;Left   Pain Descriptors / Indicators Aching   Pain Onset Other (comment)                         [facility] Adult PT Treatment/Exercise - 10/09/15 0001    High Level Balance   High Level Balance Activities Other (comment)  standing on blue balance pad x 1 min needs shelfarm assist   High Level Balance Comments standing tossing & catching small beachball x 1 min, challenging for pt.    Exercises   Exercises Lumbar;Knee/Hip   Lumbar Exercises: Seated   Other Seated Lumbar Exercises core red plyo ball hip swings, chops, Charlie Browns 1 min each   Knee/Hip Exercises: Aerobic   Nustep L2 10 min   Knee/Hip Exercises: Standing   Rebounder 3 directions x 1 min with 1 UE support   Other Standing Knee Exercises step taps 10x alternating  each leg with 1UE support   Knee/Hip Exercises: Seated   Sit to Sand 1 set;10 reps;without UE support                  PT Short Term Goals - 10/06/15 2014    PT SHORT TERM GOAL #1   Title The patient will have improved HS muscle length to 70 degrees needed for greater ease getting in and out of the car.  10/20/15   Time 4   Period Weeks   Status On-going   PT SHORT TERM GOAL #2   Title The patient will have improved trunk and LE strength be able to stand 3 min  for grooming, dressing tasks   Time 4   Period Weeks  Status On-going   PT SHORT TERM GOAL #3   Title The patient will be able to ambulate 500 feet with SPC and min to moderate back and knee pain needed for short distance community ambulation to church, Gaff[employer], symphony   Time 4   Period Weeks   Status On-going   PT SHORT TERM GOAL #4   Title The patient will have improved hip flexor muscle length to 10 degrees needed for walking longer distances with decreased energy expenditure   Time 4   Period Weeks   Status On-going   PT SHORT TERM GOAL #5   Title Timed up and go test improved to 14 sec indicating improved gait speed and safety   Time 4   Period Weeks   Status On-going           PT Long Term Goals - 10/06/15 2014    PT LONG TERM GOAL #1   Title The patient will be independent with safe self progression of HEP to improved walking distance and further conditioning   11/17/15   Time 8   Period Weeks   Status On-going   PT LONG TERM GOAL #2   Title The patient will have improved core and hip strength to grossly 4+/5 needed to stand for 5 min for light cooking   Time 8   Period Weeks   Status On-going   PT LONG TERM GOAL #3   Title The patient will be able to ambulate 700 feet with SPC with min to moderate back and knee pain for ambulating longer community distances to the symphony, church and [employer].     Time 8   Period Weeks   Status On-going   PT LONG TERM GOAL #4   Title The patient will have improved Timed up and Go test to 12 sec indicating improved gait speed and safety    Time 8   Period Weeks   Status On-going   PT LONG TERM GOAL #5   Title Patient will have improved LE strength needed for 5x sit to stand test < 13 sec   Time 8   Period Weeks   Status On-going   PT LONG TERM GOAL #6   Title BERG balance test of at least 42/56 indicating lower risk of falls    Time 8   Period Weeks   Status On-going               Plan - 10/09/15  1130    Clinical Impression Statement Pt with low endurance and challenged with tappping to cones needed min A by PTA to maintain balance. Pt was able to maintain balance with tossing and catching beachball with close S, but was fatique after one min. Pt will continue to benefit from skilled PT to progress strengthening and endurance needed for return to community activity   Rehab Potential Good   Clinical Impairments Affecting Rehab Potential A fib;  Right TKR; lumbar spinal stenosis   PT Frequency 2x / week   PT Duration 8 weeks   PT Treatment/Interventions ADLs/Self Care Home Management;Electrical Stimulation;Moist Heat;Therapeutic exercise;Ultrasound;Patient/family education;Manual techniques;Taping;Dry needling;Gait training;Functional mobility training;Neuromuscular re-education   PT Next Visit Plan standing LE strengthening; HS and psoas stretch on stairs; weight shifting; gait with SPC with O2 monitoring; balance    Consulted and Agree with Plan of Care Patient      Patient will benefit from skilled therapeutic intervention in order to improve the following deficits and impairments:  Pain, Decreased  balance, Decreased activity tolerance, Decreased strength, Decreased mobility, Difficulty walking  Visit Diagnosis: Muscle weakness (generalized)  Other abnormalities of gait and mobility  Bilateral low back pain without sciatica  Pain in left knee     Problem List Patient Active Problem List   Diagnosis Date Noted  . Paroxysmal (Persistent) atrial fibrillation ([place]) 10/01/2015  . Pneumonia   . Pneumonia due to organism   . Fever   . Acute respiratory failure ([place])   . Persistent atrial fibrillation ([place])   . Exertional dyspnea and fatigue 08/13/2013  . CAD S/P percutaneous coronary angioplasty   . Hyperlipidemia with target LDL less than 70   . Essential hypertension   . Obesity (BMI 30.0-34.9)   . Myocardial infarction; history of   . Osteoarthritis of right knee  05/11/2012    NAUMANN-HOUEGNIFIO,Roddrick Sharron PTA 10/09/2015, 11:51 AM  [facility] [address], [place], [place], [address] Phone: [phone]   Fax:  [phone]  Name: Jerry [name] MRN: [id number] Date of Birth: Sep 30, 1929

## 2015-10-12 ENCOUNTER — Encounter: Payer: Self-pay | Admitting: Physical Therapy

## 2015-10-12 ENCOUNTER — Ambulatory Visit: Payer: Medicare Other | Admitting: Physical Therapy

## 2015-10-12 DIAGNOSIS — R2689 Other abnormalities of gait and mobility: Secondary | ICD-10-CM | POA: Diagnosis not present

## 2015-10-12 DIAGNOSIS — M545 Low back pain, unspecified: Secondary | ICD-10-CM

## 2015-10-12 DIAGNOSIS — M25562 Pain in left knee: Secondary | ICD-10-CM

## 2015-10-12 DIAGNOSIS — M6281 Muscle weakness (generalized): Secondary | ICD-10-CM

## 2015-10-12 NOTE — Therapy (Signed)
[facility] [address], [place], [place], [address] Phone: [phone]   Fax:  [phone]  Physical Therapy Treatment  Patient Details  Name: Jerry [name] MRN: [id number] Date of Birth: 15-Jun-1929 Referring Provider: Dr. Alroy Dust  Encounter Date: 10/12/2015      PT End of Session - 10/12/15 1118    Visit Number 6   Number of Visits 10   Date for PT Re-Evaluation 11/17/15   Authorization Type Medicare G codes;  KX at visit 15   PT Start Time 1058   PT Stop Time 1144   PT Time Calculation (min) 46 min   Activity Tolerance Patient limited by fatigue      Past Medical History  Diagnosis Date  . Hypertension     Labile -- takes Verapamil,Losartan,and Metoprolol daily   . Peripheral edema     takes HCTZ daily  . Hyperlipidemia     takes Lipitor nightly  . CAD S/P percutaneous coronary angioplasty 1996, 2009, 01/2010    PTCA of L Cx 1996; BMS-RCA [facility]) 2009; Staged PCI RCA (70% ISR) --> 3.0 mm x 23 mm BMS, staged PCI Diag - 2.0 mm x 12 mm Mini Vision BMS (2.25 mm)  . Myocardial infarction [facility]) 1996, 2009    x 2;last time was about 8-56yrs ago   . Osteoarthritis of both knees   . Chronic back pain     scoliosis--pt states unable to have surgery bc of age  . GERD (gastroesophageal reflux disease)     takes Nexium daily  . History of colon polyps   . Urinary frequency     takes Vesicare and Flomax daily  . Urinary urgency   . Prostate cancer ([place]) 2005    seed implant  . Cataracts, bilateral     immature  . Anxiety     takes Valium prn  . Bleeding nose     occasionally  . Obesity (BMI 30.0-34.9)     Last BMI ~31;   . OSA (obstructive sleep apnea)   . Heart palpitations     PACs, short bursts of PSVT  . Physical deconditioning   . Persistent atrial fibrillation ([place])   . Pneumonia due to organism   . Leukocytosis   . Anemia   . Rash of back   . OAB (overactive bladder)     Past Surgical  History  Procedure Laterality Date  . Right knee arthrsoscopy  65yrs ago  . Cyst removed  in college  . Coronary angioplasty with stent placement  1996, 2009, August 2011    PTCA of L Cx 1996; BMS-RCA [facility]) 2009; Staged PCI RCA (70% ISR) --> 3.0 mm x 23 mm BMS, staged PCI Diag - 2.0 mm x 12 mm Mini Vision BMS (2.25 mm)  . Wisdom teeth extraccted    . Colonoscopy    . Seeds placed into prostate   2005  . Cholecystectomy    . Neuroplasty / transposition median nerve at carpal tunnel bilateral    . Right trigger finger    . Total knee arthroplasty  05/09/2012    Procedure: TOTAL KNEE ARTHROPLASTY;  Surgeon: Kerin Salen, MD;  Location: [facility];  Service: Orthopedics;  Laterality: Right;  . Transthoracic echocardiogram  09/26/2012    EF 55-60%. Grade 2 diastolic dysfunction with moderate concentric LVH. Aortic sclerosis.  [name] Nm myoview ltd  November 2013    EF 53%; fixed mid inferior-inferolateral defect, infarct with no ischemia.  [name] Nm  myoview ltd  11/24/2010    ejection fraction 58%,left ventricle is normal size ,no evidence of inducible myocardial ischemia  . Cardioversion N/A 06/12/2015    Procedure: CARDIOVERSION;  Surgeon: Sanda Klein, MD;  [facility];  Service: Cardiovascular;  Laterality: N/A;  . Tee without cardioversion N/A 06/12/2015    Procedure: TRANSESOPHAGEAL ECHOCARDIOGRAM (TEE);  Surgeon: Sanda Klein, MD;  Location: [place];  Service: Cardiovascular;  Laterality: N/A;  . Cardioversion N/A 08/02/2015    Procedure: CARDIOVERSION;  Surgeon: [name], MD;  Location: [facility] [place] OR;  Service: Cardiovascular;  Laterality: N/A;    There were no vitals filed for this visit.      Subjective Assessment - 10/12/15 1114    Subjective Pt did not sleep well last night. Pt still concerned about his balance and wishes to work a lot on balance.    Patient is accompained by: Family member   Pertinent History A fib;  no  cardiac implants; cardioversion;  hx of prostrate CA     Limitations Walking;House hold activities   How long can you stand comfortably? unable to stand to shave or make a sandwich   How long can you walk comfortably? 300 feet with shortness of breath and low back pain   Patient Stated Goals be able to have more stamina;  participate in household chores (plan and cook meals);  get in /out of the car and walk into church, cinema, symphony   Currently in Pain? Yes   Pain Score 2    Pain Location Knee   Pain Orientation Left   Pain Descriptors / Indicators Aching   Pain Type Chronic pain   Pain Score 1   Pain Location Shoulder   Pain Orientation Right;Left   Pain Descriptors / Indicators Aching                         [facility] Adult PT Treatment/Exercise - 10/12/15 0001    Exercises   Exercises Lumbar;Knee/Hip   Lumbar Exercises: Seated   Other Seated Lumbar Exercises core red plyo ball hip swings, chops, Charlie Browns 1 min each   Knee/Hip Exercises: Aerobic   Nustep L2 10 min, 0.33 mph   Knee/Hip Exercises: Standing   Rebounder 3 directions x 1 min with 1 UE support   Other Standing Knee Exercises step taps 3 x10x alternating  1st with B UE support, 2nd 1 UE support, 3rd without    Other Standing Knee Exercises catching and tossing small beachball x 1 min with close S   Knee/Hip Exercises: Seated   Long Arc Quad Strengthening;Right;Left;10 reps  3# added                  PT Short Term Goals - 10/12/15 1122    PT SHORT TERM GOAL #1   Title The patient will have improved HS muscle length to 70 degrees needed for greater ease getting in and out of the car.  10/20/15   Time 4   Period Weeks   Status On-going   PT SHORT TERM GOAL #2   Title The patient will have improved trunk and LE strength be able to stand 3 min for grooming, dressing tasks   Time 4   Period Weeks   Status On-going   PT SHORT TERM GOAL #3   Title The patient will be able to ambulate 500 feet with SPC and min to moderate back and knee  pain needed for short distance community ambulation  to church, [employer]aff[employer], symphony   Time 4   Period Weeks   Status On-going   PT SHORT TERM GOAL #4   Title The patient will have improved hip flexor muscle length to 10 degrees needed for walking longer distances with decreased energy expenditure   Time 4   Period Weeks   Status On-going   PT SHORT TERM GOAL #5   Title Timed up and go test improved to 14 sec indicating improved gait speed and safety   Time 4   Period Weeks   Status On-going           PT Long Term Goals - 10/06/15 2014    PT LONG TERM GOAL #1   Title The patient will be independent with safe self progression of HEP to improved walking distance and further conditioning   11/17/15   Time 8   Period Weeks   Status On-going   PT LONG TERM GOAL #2   Title The patient will have improved core and hip strength to grossly 4+/5 needed to stand for 5 min for light cooking   Time 8   Period Weeks   Status On-going   PT LONG TERM GOAL #3   Title The patient will be able to ambulate 700 feet with SPC with min to moderate back and knee pain for ambulating longer community distances to the symphony, church and [employer].     Time 8   Period Weeks   Status On-going   PT LONG TERM GOAL #4   Title The patient will have improved Timed up and Go test to 12 sec indicating improved gait speed and safety    Time 8   Period Weeks   Status On-going   PT LONG TERM GOAL #5   Title Patient will have improved LE strength needed for 5x sit to stand test < 13 sec   Time 8   Period Weeks   Status On-going   PT LONG TERM GOAL #6   Title BERG balance test of at least 42/56 indicating lower risk of falls    Time 8   Period Weeks   Status On-going               Plan - 10/12/15 1120    Clinical Impression Statement Pt continues to present with low endurance, limited balance and strength. Pt will continue to benefit from skilled PT to progress strengthening and endurance  needed for return to community activity   Rehab Potential Good   Clinical Impairments Affecting Rehab Potential A fib;  Right TKR; lumbar spinal stenosis   PT Frequency 2x / week   PT Duration 8 weeks   PT Treatment/Interventions ADLs/Self Care Home Management;Electrical Stimulation;Moist Heat;Therapeutic exercise;Ultrasound;Patient/family education;Manual techniques;Taping;Dry needling;Gait training;Functional mobility training;Neuromuscular re-education   PT Next Visit Plan standing LE strengthening; HS and psoas stretch on stairs; weight shifting; gait with SPC with O2 monitoring; balance    Consulted and Agree with Plan of Care Patient      Patient will benefit from skilled therapeutic intervention in order to improve the following deficits and impairments:  Pain, Decreased balance, Decreased activity tolerance, Decreased strength, Decreased mobility, Difficulty walking  Visit Diagnosis: Muscle weakness (generalized)  Other abnormalities of gait and mobility  Bilateral low back pain without sciatica  Pain in left knee     Problem List Patient Active Problem List   Diagnosis Date Noted  . Paroxysmal (Persistent) atrial fibrillation ([place]) 10/01/2015  . Pneumonia   .  Pneumonia due to organism   . Fever   . Acute respiratory failure ([place])   . Persistent atrial fibrillation ([place])   . Exertional dyspnea and fatigue 08/13/2013  . CAD S/P percutaneous coronary angioplasty   . Hyperlipidemia with target LDL less than 70   . Essential hypertension   . Obesity (BMI 30.0-34.9)   . Myocardial infarction; history of   . Osteoarthritis of right knee 05/11/2012    NAUMANN-HOUEGNIFIO,Dewey Viens PTA 10/12/2015, 11:49 AM  [facility] [address], [place], [place], [address] Phone: [phone]   Fax:  [phone]  Name: Jerry [name] MRN: [id number] Date of Birth: 09-Apr-1930

## 2015-10-14 ENCOUNTER — Encounter: Payer: Self-pay | Admitting: Physical Therapy

## 2015-10-14 ENCOUNTER — Ambulatory Visit: Payer: Medicare Other | Admitting: Physical Therapy

## 2015-10-14 DIAGNOSIS — M6281 Muscle weakness (generalized): Secondary | ICD-10-CM

## 2015-10-14 DIAGNOSIS — M545 Low back pain, unspecified: Secondary | ICD-10-CM

## 2015-10-14 DIAGNOSIS — R2689 Other abnormalities of gait and mobility: Secondary | ICD-10-CM | POA: Diagnosis not present

## 2015-10-14 DIAGNOSIS — M25562 Pain in left knee: Secondary | ICD-10-CM | POA: Diagnosis not present

## 2015-10-14 NOTE — Therapy (Signed)
[facility] [address] [place], [place], [address] Phone: [phone]   Fax:  [phone]  Physical Therapy Treatment  Patient Details  Name: Jerry [name] MRN: [id number] Date of Birth: Feb 19, 1930 Referring Provider: Dr. Alroy Dust  Encounter Date: 10/14/2015      PT End of Session - 10/14/15 1107    Visit Number 7   Number of Visits 10   Date for PT Re-Evaluation 11/17/15   Authorization Type Medicare G codes;  KX at visit 15   PT Start Time 1059   PT Stop Time 1144   PT Time Calculation (min) 45 min   Activity Tolerance Patient limited by fatigue      Past Medical History  Diagnosis Date  . Hypertension     Labile -- takes Verapamil,Losartan,and Metoprolol daily   . Peripheral edema     takes HCTZ daily  . Hyperlipidemia     takes Lipitor nightly  . CAD S/P percutaneous coronary angioplasty 1996, 2009, 01/2010    PTCA of L Cx 1996; BMS-RCA [facility]) 2009; Staged PCI RCA (70% ISR) --> 3.0 mm x 23 mm BMS, staged PCI Diag - 2.0 mm x 12 mm Mini Vision BMS (2.25 mm)  . Myocardial infarction [facility]) 1996, 2009    x 2;last time was about 8-19yrs ago   . Osteoarthritis of both knees   . Chronic back pain     scoliosis--pt states unable to have surgery bc of age  . GERD (gastroesophageal reflux disease)     takes Nexium daily  . History of colon polyps   . Urinary frequency     takes Vesicare and Flomax daily  . Urinary urgency   . Prostate cancer ([place]) 2005    seed implant  . Cataracts, bilateral     immature  . Anxiety     takes Valium prn  . Bleeding nose     occasionally  . Obesity (BMI 30.0-34.9)     Last BMI ~31;   . OSA (obstructive sleep apnea)   . Heart palpitations     PACs, short bursts of PSVT  . Physical deconditioning   . Persistent atrial fibrillation ([place])   . Pneumonia due to organism   . Leukocytosis   . Anemia   . Rash of back   . OAB (overactive bladder)     Past  Surgical History  Procedure Laterality Date  . Right knee arthrsoscopy  67yrs ago  . Cyst removed  in college  . Coronary angioplasty with stent placement  1996, 2009, August 2011    PTCA of L Cx 1996; BMS-RCA [facility]) 2009; Staged PCI RCA (70% ISR) --> 3.0 mm x 23 mm BMS, staged PCI Diag - 2.0 mm x 12 mm Mini Vision BMS (2.25 mm)  . Wisdom teeth extraccted    . Colonoscopy    . Seeds placed into prostate   2005  . Cholecystectomy    . Neuroplasty / transposition median nerve at carpal tunnel bilateral    . Right trigger finger    . Total knee arthroplasty  05/09/2012    Procedure: TOTAL KNEE ARTHROPLASTY;  Surgeon: Kerin Salen, MD;  Location: [facility];  Service: Orthopedics;  Laterality: Right;  . Transthoracic echocardiogram  09/26/2012    EF 55-60%. Grade 2 diastolic dysfunction with moderate concentric LVH. Aortic sclerosis.  [name] Nm myoview ltd  November 2013    EF 53%; fixed mid inferior-inferolateral defect, infarct with no ischemia.  [name] Nm  myoview ltd  11/24/2010    ejection fraction 58%,left ventricle is normal size ,no evidence of inducible myocardial ischemia  . Cardioversion N/A 06/12/2015    Procedure: CARDIOVERSION;  Surgeon: Sanda Klein, MD;  [facility];  Service: Cardiovascular;  Laterality: N/A;  . Tee without cardioversion N/A 06/12/2015    Procedure: TRANSESOPHAGEAL ECHOCARDIOGRAM (TEE);  Surgeon: Sanda Klein, MD;  Location: [facility];  Service: Cardiovascular;  Laterality: N/A;  . Cardioversion N/A 08/02/2015    Procedure: CARDIOVERSION;  Surgeon: [name], MD;  Location: [facility];  Service: Cardiovascular;  Laterality: N/A;    There were no vitals filed for this visit.      Subjective Assessment - 10/14/15 1105    Subjective Pt had a good nigth sleep. Pt with balance deficits   Pertinent History A fib;  no  cardiac implants; cardioversion;  hx of prostrate CA    How long can you stand comfortably? unable to stand to shave or make a sandwich   How  long can you walk comfortably? 300 feet with shortness of breath and low back pain   Patient Stated Goals be able to have more stamina;  participate in household chores (plan and cook meals);  get in /out of the car and walk into church, cinema, symphony   Currently in Pain? Yes   Pain Score 2    Pain Location Knee   Pain Orientation Left   Pain Descriptors / Indicators Aching   Pain Type Chronic pain   Pain Onset Other (comment)   Pain Frequency Constant   Aggravating Factors  stand/walk   Multiple Pain Sites Yes   Pain Score 1   Pain Location Shoulder   Pain Orientation Right;Left   Pain Descriptors / Indicators Aching   Aggravating Factors  reaching                         [facility] Adult PT Treatment/Exercise - 10/14/15 0001    Exercises   Exercises Lumbar;Knee/Hip;Shoulder   Lumbar Exercises: Standing   Row --   Lumbar Exercises: Seated   Other Seated Lumbar Exercises core red plyo ball hip swings, chops, Charlie Browns 1 min each   Knee/Hip Exercises: Aerobic   Nustep L2 10 min, 0.33 mph, seat#11, arms 10   Knee/Hip Exercises: Standing   Rebounder 3 directions x 1 min with 1 UE support   Other Standing Knee Exercises step taps 3 x10x alternating   Other Standing Knee Exercises catching and tossing small beachball x 1 min with close S   Knee/Hip Exercises: Seated   Long Arc Quad Strengthening;Right;Left;10 reps  3# added   Shoulder Exercises: Seated   Row Strengthening;20 reps;Weights  30# sitting in chair with noodle in back for trunk extension   Other Seated Exercises Cane into 90 flexion 2  x10                  PT Short Term Goals - 10/12/15 1122    PT SHORT TERM GOAL #1   Title The patient will have improved HS muscle length to 70 degrees needed for greater ease getting in and out of the car.  10/20/15   Time 4   Period Weeks   Status On-going   PT SHORT TERM GOAL #2   Title The patient will have improved trunk and LE strength be able to  stand 3 min for grooming, dressing tasks   Time 4   Period Weeks   Status  On-going   PT SHORT TERM GOAL #3   Title The patient will be able to ambulate 500 feet with SPC and min to moderate back and knee pain needed for short distance community ambulation to church, [employer]aff[employer], symphony   Time 4   Period Weeks   Status On-going   PT SHORT TERM GOAL #4   Title The patient will have improved hip flexor muscle length to 10 degrees needed for walking longer distances with decreased energy expenditure   Time 4   Period Weeks   Status On-going   PT SHORT TERM GOAL #5   Title Timed up and go test improved to 14 sec indicating improved gait speed and safety   Time 4   Period Weeks   Status On-going           PT Long Term Goals - 10/06/15 2014    PT LONG TERM GOAL #1   Title The patient will be independent with safe self progression of HEP to improved walking distance and further conditioning   11/17/15   Time 8   Period Weeks   Status On-going   PT LONG TERM GOAL #2   Title The patient will have improved core and hip strength to grossly 4+/5 needed to stand for 5 min for light cooking   Time 8   Period Weeks   Status On-going   PT LONG TERM GOAL #3   Title The patient will be able to ambulate 700 feet with SPC with min to moderate back and knee pain for ambulating longer community distances to the symphony, church and [employer].     Time 8   Period Weeks   Status On-going   PT LONG TERM GOAL #4   Title The patient will have improved Timed up and Go test to 12 sec indicating improved gait speed and safety    Time 8   Period Weeks   Status On-going   PT LONG TERM GOAL #5   Title Patient will have improved LE strength needed for 5x sit to stand test < 13 sec   Time 8   Period Weeks   Status On-going   PT LONG TERM GOAL #6   Title BERG balance test of at least 42/56 indicating lower risk of falls    Time 8   Period Weeks   Status On-going               Plan  - 10/14/15 1108    Clinical Impression Statement Pt with low endurance, limited balance and strength, but PTA observed improved pace on Nu-Step. pt will continue to benefit from skilled PT to progress strengthening and endurance to be able to return to community activity.    Rehab Potential Good   Clinical Impairments Affecting Rehab Potential A fib;  Right TKR; lumbar spinal stenosis   PT Frequency 2x / week   PT Duration 8 weeks   PT Treatment/Interventions ADLs/Self Care Home Management;Electrical Stimulation;Moist Heat;Therapeutic exercise;Ultrasound;Patient/family education;Manual techniques;Taping;Dry needling;Gait training;Functional mobility training;Neuromuscular re-education   PT Next Visit Plan standing LE strengthening; HS and psoas stretch on stairs; weight shifting; gait with SPC with O2 monitoring; balance    Consulted and Agree with Plan of Care Patient      Patient will benefit from skilled therapeutic intervention in order to improve the following deficits and impairments:  Pain, Decreased balance, Decreased activity tolerance, Decreased strength, Decreased mobility, Difficulty walking  Visit Diagnosis: Muscle weakness (generalized)  Other abnormalities of gait  and mobility  Bilateral low back pain without sciatica  Pain in left knee     Problem List Patient Active Problem List   Diagnosis Date Noted  . Paroxysmal (Persistent) atrial fibrillation ([place]) 10/01/2015  . Pneumonia   . Pneumonia due to organism   . Fever   . Acute respiratory failure ([place])   . Persistent atrial fibrillation ([place])   . Exertional dyspnea and fatigue 08/13/2013  . CAD S/P percutaneous coronary angioplasty   . Hyperlipidemia with target LDL less than 70   . Essential hypertension   . Obesity (BMI 30.0-34.9)   . Myocardial infarction; history of   . Osteoarthritis of right knee 05/11/2012    NAUMANN-HOUEGNIFIO,Thula Stewart PTA 10/14/2015, 11:48 AM  [facility] [address] [place], [place], [address] Phone: [phone]   Fax:  [phone]  Name: NGOZI RECZEK MRN: [id number] Date of Birth: 12/23/1929

## 2015-10-15 ENCOUNTER — Other Ambulatory Visit: Payer: Self-pay | Admitting: Physician Assistant

## 2015-10-20 ENCOUNTER — Encounter: Payer: Self-pay | Admitting: Physical Therapy

## 2015-10-20 ENCOUNTER — Ambulatory Visit: Payer: Medicare Other | Admitting: Physical Therapy

## 2015-10-20 DIAGNOSIS — R2689 Other abnormalities of gait and mobility: Secondary | ICD-10-CM | POA: Diagnosis not present

## 2015-10-20 DIAGNOSIS — M545 Low back pain, unspecified: Secondary | ICD-10-CM

## 2015-10-20 DIAGNOSIS — M6281 Muscle weakness (generalized): Secondary | ICD-10-CM | POA: Diagnosis not present

## 2015-10-20 DIAGNOSIS — M25562 Pain in left knee: Secondary | ICD-10-CM

## 2015-10-20 NOTE — Therapy (Signed)
[facility] [address] [place], [place], [address] Phone: [phone]   Fax:  [phone]  Physical Therapy Treatment  Patient Details  Name: Jerry [name] MRN: [id number] Date of Birth: Mar 23, 1930 Referring Provider: Dr. Alroy Dust  Encounter Date: 10/20/2015      PT End of Session - 10/20/15 1100    Visit Number 8   Number of Visits 10   Date for PT Re-Evaluation 11/17/15   Authorization Type Medicare G codes;  KX at visit 15   PT Start Time 1058   PT Stop Time 1145   PT Time Calculation (min) 47 min   Activity Tolerance Patient limited by fatigue      Past Medical History  Diagnosis Date  . Hypertension     Labile -- takes Verapamil,Losartan,and Metoprolol daily   . Peripheral edema     takes HCTZ daily  . Hyperlipidemia     takes Lipitor nightly  . CAD S/P percutaneous coronary angioplasty 1996, 2009, 01/2010    PTCA of L Cx 1996; BMS-RCA [facility]) 2009; Staged PCI RCA (70% ISR) --> 3.0 mm x 23 mm BMS, staged PCI Diag - 2.0 mm x 12 mm Mini Vision BMS (2.25 mm)  . Myocardial infarction [facility]) 1996, 2009    x 2;last time was about 8-74yrs ago   . Osteoarthritis of both knees   . Chronic back pain     scoliosis--pt states unable to have surgery bc of age  . GERD (gastroesophageal reflux disease)     takes Nexium daily  . History of colon polyps   . Urinary frequency     takes Vesicare and Flomax daily  . Urinary urgency   . Prostate cancer ([place]) 2005    seed implant  . Cataracts, bilateral     immature  . Anxiety     takes Valium prn  . Bleeding nose     occasionally  . Obesity (BMI 30.0-34.9)     Last BMI ~31;   . OSA (obstructive sleep apnea)   . Heart palpitations     PACs, short bursts of PSVT  . Physical deconditioning   . Persistent atrial fibrillation ([place])   . Pneumonia due to organism   . Leukocytosis   . Anemia   . Rash of back   . OAB (overactive bladder)     Past  Surgical History  Procedure Laterality Date  . Right knee arthrsoscopy  71yrs ago  . Cyst removed  in college  . Coronary angioplasty with stent placement  1996, 2009, August 2011    PTCA of L Cx 1996; BMS-RCA [facility]) 2009; Staged PCI RCA (70% ISR) --> 3.0 mm x 23 mm BMS, staged PCI Diag - 2.0 mm x 12 mm Mini Vision BMS (2.25 mm)  . Wisdom teeth extraccted    . Colonoscopy    . Seeds placed into prostate   2005  . Cholecystectomy    . Neuroplasty / transposition median nerve at carpal tunnel bilateral    . Right trigger finger    . Total knee arthroplasty  05/09/2012    Procedure: TOTAL KNEE ARTHROPLASTY;  Surgeon: Kerin Salen, MD;  Location: [place];  Service: Orthopedics;  Laterality: Right;  . Transthoracic echocardiogram  09/26/2012    EF 55-60%. Grade 2 diastolic dysfunction with moderate concentric LVH. Aortic sclerosis.  [name] Nm myoview ltd  November 2013    EF 53%; fixed mid inferior-inferolateral defect, infarct with no ischemia.  [name] Nm  myoview ltd  11/24/2010    ejection fraction 58%,left ventricle is normal size ,no evidence of inducible myocardial ischemia  . Cardioversion N/A 06/12/2015    Procedure: CARDIOVERSION;  Surgeon: Sanda Klein, MD;  [facility];  Service: Cardiovascular;  Laterality: N/A;  . Tee without cardioversion N/A 06/12/2015    Procedure: TRANSESOPHAGEAL ECHOCARDIOGRAM (TEE);  Surgeon: Sanda Klein, MD;  Location: [facility];  Service: Cardiovascular;  Laterality: N/A;  . Cardioversion N/A 08/02/2015    Procedure: CARDIOVERSION;  Surgeon: [name], MD;  Location: [facility] OR;  Service: Cardiovascular;  Laterality: N/A;    There were no vitals filed for this visit.      Subjective Assessment - 10/20/15 1058    Subjective Pt is feeling good today, and he had a good night sleep   Patient is accompained by: Family member   Pertinent History A fib;  no  cardiac implants; cardioversion;  hx of prostrate CA    Limitations Walking;House hold activities    How long can you stand comfortably? unable to stand to shave or make a sandwich   How long can you walk comfortably? 300 feet with shortness of breath and low back pain   Patient Stated Goals be able to have more stamina;  participate in household chores (plan and cook meals);  get in /out of the car and walk into church, cinema, symphony   Currently in Pain? Yes   Pain Score 2    Pain Location Knee   Pain Orientation Left   Pain Descriptors / Indicators Aching   Pain Type Chronic pain   Pain Onset Other (comment)   Pain Frequency Constant   Aggravating Factors  stand/walk   Multiple Pain Sites Yes                         [facility] Adult PT Treatment/Exercise - 10/20/15 0001    Exercises   Exercises Lumbar;Knee/Hip;Shoulder   Lumbar Exercises: Seated   Other Seated Lumbar Exercises core red plyo ball hip swings, chops, Charlie Browns 1 min each   Knee/Hip Exercises: Aerobic   Nustep L2 10 min, 0.41 mph, seat#11, arms 10   Knee/Hip Exercises: Standing   Rebounder 3 directions x 1 min with 1 UE support   Other Standing Knee Exercises step taps 3 x10x alternating  1st set with B UE support, 2nd set 1 UE, 3rd without   Other Standing Knee Exercises catching & tossing beachball 3 x 1 min with close S, while counting backward by 5, 7 and 8   Knee/Hip Exercises: Seated   Long Arc Quad Strengthening;Right;Left;10 reps  3# added   Shoulder Exercises: Seated   Row Strengthening;20 reps;Weights  35# 3  x10   Other Seated Exercises Cane into 90 flexion 2  x10                  PT Short Term Goals - 10/20/15 1102    PT SHORT TERM GOAL #1   Title The patient will have improved HS muscle length to 70 degrees needed for greater ease getting in and out of the car.  10/20/15   Time 4   Period Weeks   Status On-going   PT SHORT TERM GOAL #2   Title The patient will have improved trunk and LE strength be able to stand 3 min for grooming, dressing tasks   Time 4    Period Weeks   Status On-going   PT SHORT TERM GOAL #  3   Title The patient will be able to ambulate 500 feet with SPC and min to moderate back and knee pain needed for short distance community ambulation to church, Gaffer, symphony   Time 4   Period Weeks   Status On-going   PT SHORT TERM GOAL #4   Title The patient will have improved hip flexor muscle length to 10 degrees needed for walking longer distances with decreased energy expenditure   Time 4   Period Weeks   Status On-going   PT SHORT TERM GOAL #5   Title Timed up and go test improved to 14 sec indicating improved gait speed and safety   Time 4   Period Weeks   Status On-going           PT Long Term Goals - 10/06/15 2014    PT LONG TERM GOAL #1   Title The patient will be independent with safe self progression of HEP to improved walking distance and further conditioning   11/17/15   Time 8   Period Weeks   Status On-going   PT LONG TERM GOAL #2   Title The patient will have improved core and hip strength to grossly 4+/5 needed to stand for 5 min for light cooking   Time 8   Period Weeks   Status On-going   PT LONG TERM GOAL #3   Title The patient will be able to ambulate 700 feet with SPC with min to moderate back and knee pain for ambulating longer community distances to the symphony, church and Gaffer.     Time 8   Period Weeks   Status On-going   PT LONG TERM GOAL #4   Title The patient will have improved Timed up and Go test to 12 sec indicating improved gait speed and safety    Time 8   Period Weeks   Status On-going   PT LONG TERM GOAL #5   Title Patient will have improved LE strength needed for 5x sit to stand test < 13 sec   Time 8   Period Weeks   Status On-going   PT LONG TERM GOAL #6   Title BERG balance test of at least 42/56 indicating lower risk of falls    Time 8   Period Weeks   Status On-going               Plan - 10/20/15 1100    Clinical Impression Statement Pt  continues with limited balance and strength, but patient continues to show improvement with his activity tolerance with exercises in PT clinic. Pt will continue to benefit from skilled PT to progress strenthening and endurance to retrun to community activity.    Rehab Potential Good   Clinical Impairments Affecting Rehab Potential A fib;  Right TKR; lumbar spinal stenosis   PT Frequency 2x / week   PT Duration 8 weeks   PT Treatment/Interventions ADLs/Self Care Home Management;Electrical Stimulation;Moist Heat;Therapeutic exercise;Ultrasound;Patient/family education;Manual techniques;Taping;Dry needling;Gait training;Functional mobility training;Neuromuscular re-education   PT Next Visit Plan standing LE strengthening; HS and psoas stretch on stairs; weight shifting; gait with SPC with O2 monitoring; balance    Consulted and Agree with Plan of Care Patient      Patient will benefit from skilled therapeutic intervention in order to improve the following deficits and impairments:  Pain, Decreased balance, Decreased activity tolerance, Decreased strength, Decreased mobility, Difficulty walking  Visit Diagnosis: Muscle weakness (generalized)  Other abnormalities of gait and mobility  Bilateral low back pain without sciatica  Pain in left knee     Problem List Patient Active Problem List   Diagnosis Date Noted  . Paroxysmal (Persistent) atrial fibrillation ([place]) 10/01/2015  . Pneumonia   . Pneumonia due to organism   . Fever   . Acute respiratory failure ([place])   . Persistent atrial fibrillation ([place])   . Exertional dyspnea and fatigue 08/13/2013  . CAD S/P percutaneous coronary angioplasty   . Hyperlipidemia with target LDL less than 70   . Essential hypertension   . Obesity (BMI 30.0-34.9)   . Myocardial infarction; history of   . Osteoarthritis of right knee 05/11/2012    NAUMANN-HOUEGNIFIO,Quenten Nawaz PTA 10/20/2015, 11:45 AM  [facility] [address] [place], [place], [address] Phone: [phone]   Fax:  [phone]  Name: Jerry [name] MRN: [id number] Date of Birth: Jan 15, 1930

## 2015-10-23 ENCOUNTER — Ambulatory Visit: Payer: Medicare Other | Admitting: Physical Therapy

## 2015-10-23 ENCOUNTER — Encounter: Payer: Self-pay | Admitting: Physical Therapy

## 2015-10-23 DIAGNOSIS — M6281 Muscle weakness (generalized): Secondary | ICD-10-CM

## 2015-10-23 DIAGNOSIS — M545 Low back pain, unspecified: Secondary | ICD-10-CM

## 2015-10-23 DIAGNOSIS — R2689 Other abnormalities of gait and mobility: Secondary | ICD-10-CM

## 2015-10-23 DIAGNOSIS — M25562 Pain in left knee: Secondary | ICD-10-CM | POA: Diagnosis not present

## 2015-10-23 NOTE — Therapy (Signed)
[facility] [address], [place], [place], [address] Phone: [phone]   Fax:  [phone]  Physical Therapy Treatment  Patient Details  Name: Jerry [name] MRN: [id number] Date of Birth: January 03, 1930 Referring Provider: Dr. Alroy Dust  Encounter Date: 10/23/2015      PT End of Session - 10/23/15 1109    Visit Number 9   Number of Visits 10   Date for PT Re-Evaluation 11/17/15   Authorization Type Medicare G codes;  KX at visit 15   PT Start Time 1059   PT Stop Time 1144   PT Time Calculation (min) 45 min   Activity Tolerance Patient limited by fatigue   Behavior During Therapy [facility] for tasks assessed/performed      Past Medical History  Diagnosis Date  . Hypertension     Labile -- takes Verapamil,Losartan,and Metoprolol daily   . Peripheral edema     takes HCTZ daily  . Hyperlipidemia     takes Lipitor nightly  . CAD S/P percutaneous coronary angioplasty 1996, 2009, 01/2010    PTCA of L Cx 1996; BMS-RCA [facility]) 2009; Staged PCI RCA (70% ISR) --> 3.0 mm x 23 mm BMS, staged PCI Diag - 2.0 mm x 12 mm Mini Vision BMS (2.25 mm)  . Myocardial infarction [facility]) 1996, 2009    x 2;last time was about 8-26yrs ago   . Osteoarthritis of both knees   . Chronic back pain     scoliosis--pt states unable to have surgery bc of age  . GERD (gastroesophageal reflux disease)     takes Nexium daily  . History of colon polyps   . Urinary frequency     takes Vesicare and Flomax daily  . Urinary urgency   . Prostate cancer ([place]) 2005    seed implant  . Cataracts, bilateral     immature  . Anxiety     takes Valium prn  . Bleeding nose     occasionally  . Obesity (BMI 30.0-34.9)     Last BMI ~31;   . OSA (obstructive sleep apnea)   . Heart palpitations     PACs, short bursts of PSVT  . Physical deconditioning   . Persistent atrial fibrillation ([place])   . Pneumonia due to organism   . Leukocytosis   . Anemia   .  Rash of back   . OAB (overactive bladder)     Past Surgical History  Procedure Laterality Date  . Right knee arthrsoscopy  68yrs ago  . Cyst removed  in college  . Coronary angioplasty with stent placement  1996, 2009, August 2011    PTCA of L Cx 1996; BMS-RCA [facility]) 2009; Staged PCI RCA (70% ISR) --> 3.0 mm x 23 mm BMS, staged PCI Diag - 2.0 mm x 12 mm Mini Vision BMS (2.25 mm)  . Wisdom teeth extraccted    . Colonoscopy    . Seeds placed into prostate   2005  . Cholecystectomy    . Neuroplasty / transposition median nerve at carpal tunnel bilateral    . Right trigger finger    . Total knee arthroplasty  05/09/2012    Procedure: TOTAL KNEE ARTHROPLASTY;  Surgeon: Kerin Salen, MD;  Location: [facility];  Service: Orthopedics;  Laterality: Right;  . Transthoracic echocardiogram  09/26/2012    EF 55-60%. Grade 2 diastolic dysfunction with moderate concentric LVH. Aortic sclerosis.  [name] Nm myoview ltd  November 2013    EF 53%; fixed mid  inferior-inferolateral defect, infarct with no ischemia.  [name] Nm myoview ltd  11/24/2010    ejection fraction 58%,left ventricle is normal size ,no evidence of inducible myocardial ischemia  . Cardioversion N/A 06/12/2015    Procedure: CARDIOVERSION;  Surgeon: Sanda Klein, MD;  [facility];  Service: Cardiovascular;  Laterality: N/A;  . Tee without cardioversion N/A 06/12/2015    Procedure: TRANSESOPHAGEAL ECHOCARDIOGRAM (TEE);  Surgeon: Sanda Klein, MD;  Location: [facility];  Service: Cardiovascular;  Laterality: N/A;  . Cardioversion N/A 08/02/2015    Procedure: CARDIOVERSION;  Surgeon: [name], MD;  Location: [facility] OR;  Service: Cardiovascular;  Laterality: N/A;    There were no vitals filed for this visit.      Subjective Assessment - 10/23/15 1106    Subjective Pt continues to be limited in daily activities due to left knee and low back pain. Pt is using SPC for ambulation   Patient is accompained by: Family member   Pertinent  History A fib;  no  cardiac implants; cardioversion;  hx of prostrate CA    Limitations Walking;House hold activities   How long can you stand comfortably? unable to stand to shave or make a sandwich   How long can you walk comfortably? 300 feet with shortness of breath and low back pain   Patient Stated Goals be able to have more stamina;  participate in household chores (plan and cook meals);  get in /out of the car and walk into church, cinema, symphony   Currently in Pain? Yes   Pain Score 3    Pain Location Knee   Pain Orientation Left   Pain Descriptors / Indicators Aching   Pain Type Chronic pain   Pain Frequency Constant   Aggravating Factors  stand/walk   Pain Score 0                         [facility] Adult PT Treatment/Exercise - 10/23/15 0001    Exercises   Exercises Lumbar;Knee/Hip;Shoulder   Lumbar Exercises: Seated   Other Seated Lumbar Exercises core yellow plyo ball hip swings, chops, Charlie Browns 1 min each   Knee/Hip Exercises: Aerobic   Nustep L2 10 min, 0.42 mph, seat#11, arms 10  O2 sats after task 96%   Knee/Hip Exercises: Standing   Rebounder 3 directions x 1 min with 1 UE support   Other Standing Knee Exercises step taps alternating x 1 min with slighly support 1 UE for balance   Knee/Hip Exercises: Seated   Long Arc Quad Strengthening;Right;Left;2 sets;10 reps  3# added   Shoulder Exercises: Seated   Row Strengthening;20 reps;10 reps  35# purple noodle in the back to improve trunk extension    Other Seated Exercises Cane into 90 flexion 2  x10  with purple noodle behind the back   Shoulder Exercises: Standing   Other Standing Exercises leaning at wall isometric contraction B UE into extension to improve back extensor strenth & posture   Other Standing Exercises standing at wall hammer curls with 4 # weight 2x 10  for strengtheing, posture and standing tolerance                  PT Short Term Goals - 10/20/15 1102    PT SHORT  TERM GOAL #1   Title The patient will have improved HS muscle length to 70 degrees needed for greater ease getting in and out of the car.  10/20/15   Time 4  Period Weeks   Status On-going   PT SHORT TERM GOAL #2   Title The patient will have improved trunk and LE strength be able to stand 3 min for grooming, dressing tasks   Time 4   Period Weeks   Status On-going   PT SHORT TERM GOAL #3   Title The patient will be able to ambulate 500 feet with SPC and min to moderate back and knee pain needed for short distance community ambulation to church, Gaffer, symphony   Time 4   Period Weeks   Status On-going   PT SHORT TERM GOAL #4   Title The patient will have improved hip flexor muscle length to 10 degrees needed for walking longer distances with decreased energy expenditure   Time 4   Period Weeks   Status On-going   PT SHORT TERM GOAL #5   Title Timed up and go test improved to 14 sec indicating improved gait speed and safety   Time 4   Period Weeks   Status On-going           PT Long Term Goals - 10/06/15 2014    PT LONG TERM GOAL #1   Title The patient will be independent with safe self progression of HEP to improved walking distance and further conditioning   11/17/15   Time 8   Period Weeks   Status On-going   PT LONG TERM GOAL #2   Title The patient will have improved core and hip strength to grossly 4+/5 needed to stand for 5 min for light cooking   Time 8   Period Weeks   Status On-going   PT LONG TERM GOAL #3   Title The patient will be able to ambulate 700 feet with SPC with min to moderate back and knee pain for ambulating longer community distances to the symphony, church and Gaffer.     Time 8   Period Weeks   Status On-going   PT LONG TERM GOAL #4   Title The patient will have improved Timed up and Go test to 12 sec indicating improved gait speed and safety    Time 8   Period Weeks   Status On-going   PT LONG TERM GOAL #5   Title Patient  will have improved LE strength needed for 5x sit to stand test < 13 sec   Time 8   Period Weeks   Status On-going   PT LONG TERM GOAL #6   Title BERG balance test of at least 42/56 indicating lower risk of falls    Time 8   Period Weeks   Status On-going               Plan - 10/23/15 1109    Clinical Impression Statement Pt continues with limited balance, overall strength and core weakness; however, he shows improvement with his activity tolerance with exercises in PT clinic. Pt will continue to benefit from skilled PT to progress strengthening and endurance to retrurn to communitiy activities.    Rehab Potential Good   Clinical Impairments Affecting Rehab Potential A fib;  Right TKR; lumbar spinal stenosis   PT Frequency 2x / week   PT Duration 8 weeks   PT Treatment/Interventions ADLs/Self Care Home Management;Electrical Stimulation;Moist Heat;Therapeutic exercise;Ultrasound;Patient/family education;Manual techniques;Taping;Dry needling;Gait training;Functional mobility training;Neuromuscular re-education   PT Next Visit Plan [place]oto, G-code, continue standing LE strengthening; HS and psoas stretch on stairs; weight shifting; gait with SPC with O2 monitoring; balance  Consulted and Agree with Plan of Care Patient      Patient will benefit from skilled therapeutic intervention in order to improve the following deficits and impairments:  Pain, Decreased balance, Decreased activity tolerance, Decreased strength, Decreased mobility, Difficulty walking  Visit Diagnosis: Muscle weakness (generalized)  Other abnormalities of gait and mobility  Bilateral low back pain without sciatica  Pain in left knee     Problem List Patient Active Problem List   Diagnosis Date Noted  . Paroxysmal (Persistent) atrial fibrillation ([place]) 10/01/2015  . Pneumonia   . Pneumonia due to organism   . Fever   . Acute respiratory failure ([place])   . Persistent atrial fibrillation ([place])   .  Exertional dyspnea and fatigue 08/13/2013  . CAD S/P percutaneous coronary angioplasty   . Hyperlipidemia with target LDL less than 70   . Essential hypertension   . Obesity (BMI 30.0-34.9)   . Myocardial infarction; history of   . Osteoarthritis of right knee 05/11/2012    NAUMANN-HOUEGNIFIO,Jerry [name] PTA 10/23/2015, 11:52 AM  [facility] [address] [place], [place], [address] Phone: [phone]   Fax:  [phone]  Name: Jerry [name] MRN: [id number] Date of Birth: 07-15-1929

## 2015-10-27 ENCOUNTER — Ambulatory Visit: Payer: Medicare Other | Admitting: Physical Therapy

## 2015-10-27 DIAGNOSIS — M6281 Muscle weakness (generalized): Secondary | ICD-10-CM | POA: Diagnosis not present

## 2015-10-27 DIAGNOSIS — M25562 Pain in left knee: Secondary | ICD-10-CM

## 2015-10-27 DIAGNOSIS — M545 Low back pain, unspecified: Secondary | ICD-10-CM

## 2015-10-27 DIAGNOSIS — R2689 Other abnormalities of gait and mobility: Secondary | ICD-10-CM

## 2015-10-27 NOTE — Therapy (Signed)
[facility] [address], [place], [place], [address] Phone: [phone]   Fax:  [phone]  Physical Therapy Treatment  Patient Details  Name: Jerry [name] MRN: [id number] Date of Birth: [birth year] Referring Provider: Dr. Alroy Dust  Encounter Date: 10/27/2015      PT End of Session - 10/27/15 1310    Visit Number 10   Number of Visits 20   Date for PT Re-Evaluation 11/17/15   Authorization Type Medicare G codes;  KX at visit 15   PT Start Time 1145   PT Stop Time 1230   PT Time Calculation (min) 45 min   Activity Tolerance Patient tolerated treatment well      Past Medical History  Diagnosis Date  . Hypertension     Labile -- takes Verapamil,Losartan,and Metoprolol daily   . Peripheral edema     takes HCTZ daily  . Hyperlipidemia     takes Lipitor nightly  . CAD S/P percutaneous coronary angioplasty 1996, 2009, 01/2010    PTCA of L Cx 1996; BMS-RCA [facility]) 2009; Staged PCI RCA (70% ISR) --> 3.0 mm x 23 mm BMS, staged PCI Diag - 2.0 mm x 12 mm Mini Vision BMS (2.25 mm)  . Myocardial infarction [facility]) 1996, 2009    x 2;last time was about 8-49yr ago   . Osteoarthritis of both knees   . Chronic back pain     scoliosis--pt states unable to have surgery bc of age  . GERD (gastroesophageal reflux disease)     takes Nexium daily  . History of colon polyps   . Urinary frequency     takes Vesicare and Flomax daily  . Urinary urgency   . Prostate cancer ([place] 2005    seed implant  . Cataracts, bilateral     immature  . Anxiety     takes Valium prn  . Bleeding nose     occasionally  . Obesity (BMI 30.0-34.9)     Last BMI ~31;   . OSA (obstructive sleep apnea)   . Heart palpitations     PACs, short bursts of PSVT  . Physical deconditioning   . Persistent atrial fibrillation ([place]   . Pneumonia due to organism   . Leukocytosis   . Anemia   . Rash of back   . OAB (overactive bladder)     Past  Surgical History  Procedure Laterality Date  . Right knee arthrsoscopy  89yrago  . Cyst removed  in college  . Coronary angioplasty with stent placement  1996, 2009, August 2011    PTCA [facility] L Cx 1996; [facility] Staged PCI RCA (70% ISR) --> 3.0 mm x 23 mm BMS, staged PCI Diag - 2.0 mm x 12 mm Mini Vision BMS (2.25 mm)  . Wisdom teeth extraccted    . Colonoscopy    . Seeds placed into prostate   2005  . Cholecystectomy    . Neuroplasty / transposition median nerve at carpal tunnel bilateral    . Right trigger finger    . Total knee arthroplasty  05/09/2012    Procedure: TOTAL KNEE ARTHROPLASTY;  Surgeon: FrKerin SalenMD;  [facility] Service[facility];  Laterality: Right;  . Transthoracic echocardiogram  09/26/2012    EF 55-60%. Grade 2 diastolic dysfunction with moderate concentric LVH. Aortic sclerosis.  . Marland Kitchenm myoview ltd  November 2013    EF 53%; fixed mid inferior-inferolateral defect, infarct with no ischemia.  . Marland Kitchenm  myoview ltd  11/24/2010    ejection fraction 58%,left ventricle is normal size ,no evidence of inducible myocardial ischemia  . Cardioversion N/A 06/12/2015    Procedure: CARDIOVERSION;  Surgeon: Sanda Klein, MD;  [facility];  Service: Cardiovascular;  Laterality: N/A;  . Tee without cardioversion N/A 06/12/2015    Procedure: TRANSESOPHAGEAL ECHOCARDIOGRAM (TEE);  Surgeon: Sanda Klein, MD;  Location: [place];  Service: Cardiovascular;  Laterality: N/A;  . Cardioversion N/A 08/02/2015    Procedure: CARDIOVERSION;  Surgeon: [name], MD;  Location: [facility] OR;  Service: Cardiovascular;  Laterality: N/A;    There were no vitals filed for this visit.      Subjective Assessment - 10/27/15 1149    Subjective Progressing per patient report but "slow."  States he is able to stand for at least 3 min for meal prep.  Went to church last Sunday but "not ready yet for the grocery store."  States his O2 sats still drop to 88% but he recovers  quickly.  Reports he will be out of town for 10 days (going to [place] soon).     Currently in Pain? Yes   Pain Score 3    Pain Location Knee   Pain Orientation Left            [facility] PT Assessment - 10/27/15 0001    Flexibility   Hamstrings Bilaterally 65 degrees   6 Minute Walk- Baseline   6 Minute Walk- Baseline yes  480 feet with SPC 2 rest breaks with O2 sats  at 93%    Standardized Balance Assessment   Five times sit to stand comments  15   Timed Up and Go Test   Manual TUG (seconds) 11.4                     [facility] Adult PT Treatment/Exercise - 10/27/15 0001    Lumbar Exercises: Stretches   Active Hamstring Stretch 3 reps;30 seconds   Lumbar Exercises: Seated   Other Seated Lumbar Exercises core yellow plyo ball hip swings, chops, Charlie Browns 1 min each  with purple foam vertically for upright posture   Knee/Hip Exercises: Aerobic   Nustep L2 10 min, 0.42 mph, seat#11, arms 10  O2 sats after task 96%   Knee/Hip Exercises: Standing   Hip ADduction --  alternating 1 min   Other Standing Knee Exercises step taps alternating x 1 min with slighly support 1 UE for balance   Knee/Hip Exercises: Seated   Sit to Sand 1 set;5 reps                  PT Short Term Goals - 10/27/15 1319    PT SHORT TERM GOAL #1   Title The patient will have improved HS muscle length to 70 degrees needed for greater ease getting in and out of the car.  10/20/15   Time 4   Period Weeks   Status Partially Met   PT SHORT TERM GOAL #2   Title The patient will have improved trunk and LE strength be able to stand 3 min for grooming, dressing tasks   Status Achieved   PT SHORT TERM GOAL #3   Title The patient will be able to ambulate 500 feet with SPC and min to moderate back and knee pain needed for short distance community ambulation to church, [employer], symphony   Time 4   Period Weeks   Status Partially Met   PT SHORT TERM GOAL #4  Title The patient will  have improved hip flexor muscle length to 10 degrees needed for walking longer distances with decreased energy expenditure   Status Achieved   PT SHORT TERM GOAL #5   Title Timed up and go test improved to 14 sec indicating improved gait speed and safety   Status Achieved           PT Long Term Goals - 2015-11-02 1320    PT LONG TERM GOAL #1   Title The patient will be independent with safe self progression of HEP to improved walking distance and further conditioning   11/17/15   Time 8   Period Weeks   Status On-going   PT LONG TERM GOAL #2   Title The patient will have improved core and hip strength to grossly 4+/5 needed to stand for 5 min for light cooking   Time 8   Period Weeks   Status On-going   PT LONG TERM GOAL #3   Title The patient will be able to ambulate 700 feet with SPC with min to moderate back and knee pain for ambulating longer community distances to the symphony, church and [employer].     Time 8   Period Weeks   Status On-going   PT LONG TERM GOAL #4   Title The patient will have improved Timed up and Go test to 12 sec indicating improved gait speed and safety    Time 8   Period Weeks   Status On-going   PT LONG TERM GOAL #5   Title Patient will have improved LE strength needed for 5x sit to stand test < 13 sec   Baseline 11.4 sec   (performed on 11/02/2015)   Status Achieved   PT LONG TERM GOAL #6   Title BERG balance test of at least 42/56 indicating lower risk of falls    Time 8   Period Weeks   Status On-going               Plan - 11-02-2015 1310    Clinical Impression Statement The patient has a significant improvement in Timed and Go test by 4 sec from iniitial.  Improved Six minute walk test as well with 2 rest breaks needed 480 feet.  Oxygen saturation drops initially with 88% but recovers to > 90% in less than 30 sec.  Knee pain limits standing/walking tolerance as well.  Progressing with STGs.  Patient will be going out of town to  [place] for 10 days.  Further reassessment needed in 2 weeks.     PT Next Visit Plan LE strengthening;  psoas and HS stretching;  gait progression with SPC with O2 monitoring;  balance;  increase standing tolerance      Patient will benefit from skilled therapeutic intervention in order to improve the following deficits and impairments:     Visit Diagnosis: Muscle weakness (generalized)  Other abnormalities of gait and mobility  Bilateral low back pain without sciatica  Pain in left knee       G-Codes - 02-Nov-2015 1321    Functional Assessment Tool Used TUG; clinical judgement   Functional Limitation Mobility: Walking and moving around   Mobility: Walking and Moving Around Current Status [id number]) At least 60 percent but less than 80 percent impaired, limited or restricted   Mobility: Walking and Moving Around Goal Status (G3151) At least 40 percent but less than 60 percent impaired, limited or restricted      Problem List Patient Active Problem  List   Diagnosis Date Noted  . Paroxysmal (Persistent) atrial fibrillation ([place]) 10/01/2015  . Pneumonia   . Pneumonia due to organism   . Fever   . Acute respiratory failure ([place])   . Persistent atrial fibrillation ([place])   . Exertional dyspnea and fatigue 08/13/2013  . CAD S/P percutaneous coronary angioplasty   . Hyperlipidemia with target LDL less than 70   . Essential hypertension   . Obesity (BMI 30.0-34.9)   . Myocardial infarction; history of   . Osteoarthritis of right knee 05/11/2012   Ruben Im, PT 10/27/2015 1:22 PM Phone: [phone] Fax: [phone]   Alvera Singh 10/27/2015, 1:22 PM  [facility] [address] [place], [place], [address] Phone: [phone]   Fax:  [phone]  Name: Jerry [name] MRN: [id number] Date of Birth: 11-Feb-1930

## 2015-10-29 ENCOUNTER — Encounter: Payer: Self-pay | Admitting: Nurse Practitioner

## 2015-10-30 ENCOUNTER — Ambulatory Visit: Payer: Medicare Other | Admitting: Physical Therapy

## 2015-10-30 ENCOUNTER — Encounter: Payer: Self-pay | Admitting: Physical Therapy

## 2015-10-30 DIAGNOSIS — M6281 Muscle weakness (generalized): Secondary | ICD-10-CM

## 2015-10-30 DIAGNOSIS — R2689 Other abnormalities of gait and mobility: Secondary | ICD-10-CM | POA: Diagnosis not present

## 2015-10-30 DIAGNOSIS — M545 Low back pain, unspecified: Secondary | ICD-10-CM

## 2015-10-30 DIAGNOSIS — M25562 Pain in left knee: Secondary | ICD-10-CM | POA: Diagnosis not present

## 2015-10-30 NOTE — Therapy (Signed)
[facility] [address], [place], [place], [address] Phone: [phone]   Fax:  [phone]  Physical Therapy Treatment  Patient Details  Name: Jerry [name] MRN: [id number] Date of Birth: 1929/07/27 Referring Provider: Dr. Alroy Dust  Encounter Date: 10/30/2015      PT End of Session - 10/30/15 1115    Visit Number 11   Number of Visits 20   Date for PT Re-Evaluation 11/17/15   Authorization Type Medicare G codes;  KX at visit 15   PT Start Time 1110   PT Stop Time 1153   PT Time Calculation (min) 43 min      Past Medical History  Diagnosis Date  . Hypertension   . Peripheral edema   . Hyperlipidemia   . CAD S/P percutaneous coronary angioplasty 1996, 2009, 01/2010    PTCA of L Cx 1996; BMS-RCA [facility]) 2009; Staged PCI RCA (70% ISR) --> 3.0 mm x 23 mm BMS, staged PCI Diag - 2.0 mm x 12 mm Mini Vision BMS (2.25 mm)  . Myocardial infarction [facility]) 1996, 2009    x 2;last time was about 8-2yr ago   . Osteoarthritis of both knees   . Chronic back pain     scoliosis--pt states unable to have surgery bc of age  . GERD (gastroesophageal reflux disease)     takes Nexium daily  . History of colon polyps   . [place]    seed implant  . Cataracts, bilateral     immature  . Anxiety   . Obesity (BMI 30.0-34.9)   . OSA (obstructive sleep apnea)   . Persistent atrial fibrillation ([facility]     Past Surgical History  Procedure Laterality Date  . Right knee arthrsoscopy  89yrago  . Cyst removed  in college  . Coronary angioplasty with stent placement  1996, 2009, August 2011    PTCA of L Cx 1996; [facility]; Staged PCI RCA (70% ISR) --> 3.0 mm x 23 mm BMS, staged PCI Diag - 2.0 mm x 12 mm Mini Vision BMS (2.25 mm)  . Wisdom teeth extraccted    . Colonoscopy    . Seeds placed into prostate   2005  . Cholecystectomy    . Neuroplasty / transposition median nerve at carpal tunnel  bilateral    . Right trigger finger    . Total knee arthroplasty  05/09/2012    Procedure: TOTAL KNEE ARTHROPLASTY;  Surgeon: FrKerin SalenMD;  [facility] Service: Orthopedics;  Laterality: Right;  . Transthoracic echocardiogram  09/26/2012    EF 55-60%. Grade 2 diastolic dysfunction with moderate concentric LVH. Aortic sclerosis.  . Marland Kitchenm myoview ltd  November 2013    EF 53%; fixed mid inferior-inferolateral defect, infarct with no ischemia.  . Marland Kitchenm myoview ltd  11/24/2010    ejection fraction 58%,left ventricle is normal size ,no evidence of inducible myocardial ischemia  . Cardioversion N/A 06/12/2015    Procedure: CARDIOVERSION;  Surgeon: MiSanda KleinMD;  [facility];  Service: Cardiovascular;  Laterality: N/A;  . Tee without cardioversion N/A 06/12/2015    Procedure: TRANSESOPHAGEAL ECHOCARDIOGRAM (TEE);  Surgeon: MiSanda KleinMD;  [place] [facility] [place] Service: Cardiovascular;  Laterality: N/A;  . Cardioversion N/A 08/02/2015    Procedure: CARDIOVERSION;  Surgeon: JaThompson GrayerMD;  [facility];  Service: Cardiovascular;  Laterality: N/A;    There were no vitals filed for this visit.  Subjective Assessment - 10/30/15 1113    Subjective My knees are achey and sore this AM.    Currently in Pain? Yes   Pain Score 3    Pain Location Knee   Pain Orientation Left   Pain Descriptors / Indicators Sore;Aching   Aggravating Factors  Walking, position   Pain Relieving Factors Non weightbearing   Multiple Pain Sites No                         [facility] Adult PT Treatment/Exercise - 10/30/15 0001    Lumbar Exercises: Stretches   Active Hamstring Stretch 3 reps;30 seconds   Lumbar Exercises: Seated   Other Seated Lumbar Exercises core yellow plyo ball hip swings, chops, Charlie Browns 2 x 12 each  with purple foam vertically for upright posture   Knee/Hip Exercises: Aerobic   Nustep L2 10 min, 0.42 mph, seat#11, arms 10  O2 sats after task 96%    Knee/Hip Exercises: Standing   Other Standing Knee Exercises step taps alternating x 1 min with slighly support 1 UE for balance  2 sets   Knee/Hip Exercises: Seated   Sit to Sand 2 sets;5 reps;without UE support                  PT Short Term Goals - 10/27/15 1319    PT SHORT TERM GOAL #1   Title The patient will have improved HS muscle length to 70 degrees needed for greater ease getting in and out of the car.  10/20/15   Time 4   Period Weeks   Status Partially Met   PT SHORT TERM GOAL #2   Title The patient will have improved trunk and LE strength be able to stand 3 min for grooming, dressing tasks   Status Achieved   PT SHORT TERM GOAL #3   Title The patient will be able to ambulate 500 feet with SPC and min to moderate back and knee pain needed for short distance community ambulation to church, [employer], symphony   Time 4   Period Weeks   Status Partially Met   PT SHORT TERM GOAL #4   Title The patient will have improved hip flexor muscle length to 10 degrees needed for walking longer distances with decreased energy expenditure   Status Achieved   PT SHORT TERM GOAL #5   Title Timed up and go test improved to 14 sec indicating improved gait speed and safety   Status Achieved           PT Long Term Goals - 10/30/15 1140    PT LONG TERM GOAL #4   Title The patient will have improved Timed up and Go test to 12 sec indicating improved gait speed and safety    Time 8   Period Weeks   Status Achieved  11.4 sec               Plan - 10/30/15 1115    Clinical Impression Statement Pt met his TUG goal, may need new goal upon Re-eval. Pt reports he was not doing any HEP at htis time so he will work on hamstring stretching at home.    Rehab Potential Good   Clinical Impairments Affecting Rehab Potential A fib;  Right TKR; lumbar spinal stenosis   PT Frequency 2x / week   PT Duration 8 weeks   PT Treatment/Interventions ADLs/Self Care Home  Management;Electrical Stimulation;Moist Heat;Therapeutic exercise;Ultrasound;Patient/family education;Manual techniques;Taping;Dry needling;Gait training;Functional mobility  training;Neuromuscular re-education   PT Next Visit Plan HEP for next weeks trip   Consulted and Agree with Plan of Care Patient      Patient will benefit from skilled therapeutic intervention in order to improve the following deficits and impairments:  Pain, Decreased balance, Decreased activity tolerance, Decreased strength, Decreased mobility, Difficulty walking  Visit Diagnosis: Muscle weakness (generalized)  Other abnormalities of gait and mobility  Bilateral low back pain without sciatica  Pain in left knee     Problem List Patient Active Problem List   Diagnosis Date Noted  . Paroxysmal (Persistent) atrial fibrillation ([place]) 10/01/2015  . Pneumonia   . Pneumonia due to organism   . Fever   . Acute respiratory failure ([place])   . Persistent atrial fibrillation ([place])   . Exertional dyspnea and fatigue 08/13/2013  . CAD S/P percutaneous coronary angioplasty   . Hyperlipidemia with target LDL less than 70   . Essential hypertension   . Obesity (BMI 30.0-34.9)   . Myocardial infarction; history of   . Osteoarthritis of right knee 05/11/2012    Travian Kerner, PTA 10/30/2015, 11:56 AM  [facility] [address], [place], [place], [address] Phone: [phone]   Fax:  [phone]  Name: Jerry [name] MRN: [id number] Date of Birth: 09-25-1929

## 2015-11-04 ENCOUNTER — Ambulatory Visit: Payer: Medicare Other | Admitting: Physical Therapy

## 2015-11-04 ENCOUNTER — Encounter: Payer: Self-pay | Admitting: Physical Therapy

## 2015-11-04 DIAGNOSIS — R2689 Other abnormalities of gait and mobility: Secondary | ICD-10-CM | POA: Diagnosis not present

## 2015-11-04 DIAGNOSIS — M25562 Pain in left knee: Secondary | ICD-10-CM | POA: Diagnosis not present

## 2015-11-04 DIAGNOSIS — M6281 Muscle weakness (generalized): Secondary | ICD-10-CM | POA: Diagnosis not present

## 2015-11-04 DIAGNOSIS — M545 Low back pain, unspecified: Secondary | ICD-10-CM

## 2015-11-04 NOTE — Therapy (Signed)
[facility] [address], [place], [place], [address] Phone: [phone]   Fax:  [phone]  Physical Therapy Treatment  Patient Details  Name: Jerry [name] MRN: [id number] Date of Birth: 1930/05/03 Referring Provider: Dr. Alroy Dust  Encounter Date: 11/04/2015      PT End of Session - 11/04/15 1101    Visit Number 12   Number of Visits 20   Date for PT Re-Evaluation 11/17/15   Authorization Type Medicare G codes;  KX at visit 15   PT Start Time 1055   PT Stop Time 1140   PT Time Calculation (min) 45 min   Activity Tolerance Patient tolerated treatment well   Behavior During Therapy [facility] for tasks assessed/performed      Past Medical History  Diagnosis Date  . Hypertension   . Peripheral edema   . Hyperlipidemia   . CAD S/P percutaneous coronary angioplasty 1996, 2009, 01/2010    PTCA of L Cx 1996; BMS-RCA [facility]) 2009; Staged PCI RCA (70% ISR) --> 3.0 mm x 23 mm BMS, staged PCI Diag - 2.0 mm x 12 mm Mini Vision BMS (2.25 mm)  . Myocardial infarction [facility]) 1996, 2009    x 2;last time was about 8-29yr ago   . Osteoarthritis of both knees   . Chronic back pain     scoliosis--pt states unable to have surgery bc of age  . GERD (gastroesophageal reflux disease)     takes Nexium daily  . History of colon polyps   . [place]    seed implant  . Cataracts, bilateral     immature  . Anxiety   . Obesity (BMI 30.0-34.9)   . OSA (obstructive sleep apnea)   . Persistent atrial fibrillation ([facility]     Past Surgical History  Procedure Laterality Date  . Right knee arthrsoscopy  89yrago  . Cyst removed  in college  . Coronary angioplasty with stent placement  1996, 2009, August 2011    PTCA [facility] L Cx 1996; [facility] Staged PCI RCA (70% ISR) --> 3.0 mm x 23 mm BMS, staged PCI Diag - 2.0 mm x 12 mm Mini Vision BMS (2.25 mm)  . Wisdom teeth extraccted    . Colonoscopy    . Seeds  placed into prostate   2005  . Cholecystectomy    . Neuroplasty / transposition median nerve at carpal tunnel bilateral    . Right trigger finger    . Total knee arthroplasty  05/09/2012    Procedure: TOTAL KNEE ARTHROPLASTY;  Surgeon: FrKerin SalenMD;  [facility]: [facility]opedics;  Laterality: Right;  . Transthoracic echocardiogram  09/26/2012    EF 55-60%. Grade 2 diastolic dysfunction with moderate concentric LVH. Aortic sclerosis.  . Marland Kitchenm myoview ltd  November 2013    EF 53%; fixed mid inferior-inferolateral defect, infarct with no ischemia.  . Marland Kitchenm myoview ltd  11/24/2010    ejection fraction 58%,left ventricle is normal size ,no evidence of inducible myocardial ischemia  . Cardioversion N/A 06/12/2015    Procedure: CARDIOVERSION;  Surgeon: MiSanda KleinMD;  [facility];  Service: Cardiovascular;  Laterality: N/A;  . Tee without cardioversion N/A 06/12/2015    Procedure: TRANSESOPHAGEAL ECHOCARDIOGRAM (TEE);  Surgeon: MiSanda KleinMD;  [facility];  Service: Cardiovascular;  Laterality: N/A;  . Cardioversion N/A 08/02/2015    Procedure: CARDIOVERSION;  Surgeon: JaThompson GrayerMD;  [facility]: [facility] Service: Cardiovascular;  Laterality:  N/A;    There were no vitals filed for this visit.      Subjective Assessment - 11/04/15 1100    Subjective My knees are about the same, however,  my walking and balance improved since start of PT   Pertinent History A fib;  no  cardiac implants; cardioversion;  hx of prostrate CA    Limitations Walking;House hold activities   How long can you stand comfortably? unable to stand to shave or make a sandwich   How long can you walk comfortably? 300 feet with shortness of breath and low back pain   Patient Stated Goals be able to have more stamina;  participate in household chores (plan and cook meals);  get in /out of the car and walk into church, cinema, symphony   Currently in Pain? Yes   Pain Score 3    Pain Location  Knee   Pain Orientation Left   Pain Descriptors / Indicators Aching;Sore   Pain Type Chronic pain   Pain Onset Other (comment)   Pain Frequency Constant   Aggravating Factors  walking, position   Pain Relieving Factors non-weightbearing   Multiple Pain Sites No                         [facility] Adult PT Treatment/Exercise - 11/04/15 0001    Exercises   Exercises Lumbar;Knee/Hip;Shoulder   Lumbar Exercises: Stretches   Active Hamstring Stretch 3 reps;30 seconds   Lumbar Exercises: Seated   Other Seated Lumbar Exercises core yellow plyo ball hip swings, chops, Charlie Browns 2 x 12 each  in standing with close S for safety   Knee/Hip Exercises: Aerobic   Nustep L2 10 min, 0.42 mph, seat#11, arms 10  unable to increase distance   Knee/Hip Exercises: Standing   Other Standing Knee Exercises step taps alternating x 1 min with slighly support 1 UE for balance  2 sets   Other Standing Knee Exercises catching & tossing beachball 2x 1 min with close S, while counting backward by 5, 7 and 8   Knee/Hip Exercises: Seated   Sit to Sand 2 sets;5 reps;without UE support   Shoulder Exercises: Seated   Row Strengthening;20 reps;10 reps  35# with noodle behing back to improve trunk extension                  PT Short Term Goals - 10/27/15 1319    PT SHORT TERM GOAL #1   Title The patient will have improved HS muscle length to 70 degrees needed for greater ease getting in and out of the car.  10/20/15   Time 4   Period Weeks   Status Partially Met   PT SHORT TERM GOAL #2   Title The patient will have improved trunk and LE strength be able to stand 3 min for grooming, dressing tasks   Status Achieved   PT SHORT TERM GOAL #3   Title The patient will be able to ambulate 500 feet with SPC and min to moderate back and knee pain needed for short distance community ambulation to church, G[employer], symphony   Time 4   Period Weeks   Status Partially Met   PT SHORT TERM  GOAL #4   Title The patient will have improved hip flexor muscle length to 10 degrees needed for walking longer distances with decreased energy expenditure   Status Achieved   PT SHORT TERM GOAL #5   Title Timed up and  go test improved to 14 sec indicating improved gait speed and safety   Status Achieved           PT Long Term Goals - 10/30/15 1140    PT LONG TERM GOAL #4   Title The patient will have improved Timed up and Go test to 12 sec indicating improved gait speed and safety    Time 8   Period Weeks   Status Achieved  11.4 sec               Plan - 11/04/15 1102    Clinical Impression Statement Pt is recovering much faster with his 02 levels after exercises, compare to initial visits. Pt is traveling out of town next week . He has re-evaluation on June 13th, patient will continue to benefit from skilled PT to advance endurance, strength and control of pain in Lt knee    Rehab Potential Good   Clinical Impairments Affecting Rehab Potential A fib;  Right TKR; lumbar spinal stenosis   PT Frequency 2x / week   PT Duration 8 weeks   PT Treatment/Interventions ADLs/Self Care Home Management;Electrical Stimulation;Moist Heat;Therapeutic exercise;Ultrasound;Patient/family education;Manual techniques;Taping;Dry needling;Gait training;Functional mobility training;Neuromuscular re-education   PT Next Visit Plan current HEP   Consulted and Agree with Plan of Care Patient      Patient will benefit from skilled therapeutic intervention in order to improve the following deficits and impairments:  Pain, Decreased balance, Decreased activity tolerance, Decreased strength, Decreased mobility, Difficulty walking  Visit Diagnosis: Muscle weakness (generalized)  Other abnormalities of gait and mobility  Bilateral low back pain without sciatica  Pain in left knee     Problem List Patient Active Problem List   Diagnosis Date Noted  . Paroxysmal (Persistent) atrial  fibrillation ([place]) 10/01/2015  . Pneumonia   . Pneumonia due to organism   . Fever   . Acute respiratory failure ([place])   . Persistent atrial fibrillation ([place])   . Exertional dyspnea and fatigue 08/13/2013  . CAD S/P percutaneous coronary angioplasty   . Hyperlipidemia with target LDL less than 70   . Essential hypertension   . Obesity (BMI 30.0-34.9)   . Myocardial infarction; history of   . Osteoarthritis of right knee 05/11/2012    NAUMANN-HOUEGNIFIO,Benancio Osmundson PTA 11/04/2015, 11:57 AM  [facility] [address] [place], [place], [address] Phone: [phone]   Fax:  [phone]  Name: Jerry [name] MRN: [id number] Date of Birth: Mar 27, 1930

## 2015-11-04 NOTE — Patient Instructions (Signed)
Leg Extension (Hamstring)   Sit toward front edge of chair, with leg out straight, heel on floor, toes pointing toward body. Keeping back straight, bend forward at hip,  Repeat __3_ times. Repeat with other leg. Do _2-3__ sessions per day.    http://orth.exer.us/661   Copyright  VHI. All rights reserved.

## 2015-11-10 ENCOUNTER — Other Ambulatory Visit: Payer: Self-pay | Admitting: Cardiology

## 2015-11-10 NOTE — Telephone Encounter (Signed)
Rx(s) sent to pharmacy electronically.  

## 2015-11-17 ENCOUNTER — Ambulatory Visit: Payer: Medicare Other | Attending: Family Medicine | Admitting: Physical Therapy

## 2015-11-17 DIAGNOSIS — M545 Low back pain, unspecified: Secondary | ICD-10-CM

## 2015-11-17 DIAGNOSIS — M25562 Pain in left knee: Secondary | ICD-10-CM | POA: Insufficient documentation

## 2015-11-17 DIAGNOSIS — R2689 Other abnormalities of gait and mobility: Secondary | ICD-10-CM | POA: Insufficient documentation

## 2015-11-17 DIAGNOSIS — M6281 Muscle weakness (generalized): Secondary | ICD-10-CM | POA: Insufficient documentation

## 2015-11-17 NOTE — Therapy (Addendum)
[facility] [address] [place], [place], [address] Phone: [phone]   Fax:  [phone]  Physical Therapy Treatment/Recertification  Patient Details  Name: Jerry [name] MRN: [id number] Date of Birth: 07-11-1929 Referring Provider: Dr. Alroy Dust  Encounter Date: 11/17/2015      PT End of Session - 11/17/15 1228    Visit Number 13   Number of Visits 20   Date for PT Re-Evaluation 12/29/15   Authorization Type Medicare G codes;  KX at visit 15   PT Start Time 1145   PT Stop Time 1230   PT Time Calculation (min) 45 min   Activity Tolerance Patient tolerated treatment well      Past Medical History  Diagnosis Date  . Hypertension   . Peripheral edema   . Hyperlipidemia   . CAD S/P percutaneous coronary angioplasty 1996, 2009, 01/2010    PTCA of L Cx 1996; BMS-RCA [facility]) 2009; Staged PCI RCA (70% ISR) --> 3.0 mm x 23 mm BMS, staged PCI Diag - 2.0 mm x 12 mm Mini Vision BMS (2.25 mm)  . Myocardial infarction [facility]) 1996, 2009    x 2;last time was about 8-99yr ago   . Osteoarthritis of both knees   . Chronic back pain     scoliosis--pt states unable to have surgery bc of age  . GERD (gastroesophageal reflux disease)     takes Nexium daily  . History of colon polyps   . [place]    seed implant  . Cataracts, bilateral     immature  . Anxiety   . Obesity (BMI 30.0-34.9)   . OSA (obstructive sleep apnea)   . Persistent atrial fibrillation ([facility]     Past Surgical History  Procedure Laterality Date  . Right knee arthrsoscopy  89yrago  . Cyst removed  in college  . Coronary angioplasty with stent placement  1996, 2009, August 2011    [facility] L Cx 1996; [facility] Staged PCI RCA (70% ISR) --> 3.0 mm x 23 mm BMS, staged PCI Diag - 2.0 mm x 12 mm Mini Vision BMS (2.25 mm)  . Wisdom teeth extraccted    . Colonoscopy    . Seeds placed into prostate   2005  .  Cholecystectomy    . Neuroplasty / transposition median nerve at carpal tunnel bilateral    . Right trigger finger    . Total knee arthroplasty  05/09/2012    Procedure: TOTAL KNEE ARTHROPLASTY;  Surgeon: FrKerin SalenMD;  [facility] Service: [facility]opedics;  Laterality: Right;  . Transthoracic echocardiogram  09/26/2012    EF 55-60%. Grade 2 diastolic dysfunction with moderate concentric LVH. Aortic sclerosis.  . Marland Kitchenm myoview ltd  November 2013    EF 53%; fixed mid inferior-inferolateral defect, infarct with no ischemia.  . Marland Kitchenm myoview ltd  11/24/2010    ejection fraction 58%,left ventricle is normal size ,no evidence of inducible myocardial ischemia  . Cardioversion N/A 06/12/2015    Procedure: CARDIOVERSION;  Surgeon: MiSanda KleinMD;  [facility];  Service: Cardiovascular;  Laterality: N/A;  . Tee without cardioversion N/A 06/12/2015    Procedure: TRANSESOPHAGEAL ECHOCARDIOGRAM (TEE);  Surgeon: MiSanda KleinMD;  [facility];  Service: Cardiovascular;  Laterality: N/A;  . Cardioversion N/A 08/02/2015    Procedure: CARDIOVERSION;  Surgeon: JaThompson GrayerMD;  [facility]: Cardi[facility];  Laterality: N/A;    There were no vitals filed  for this visit.      Subjective Assessment - 11/17/15 1142    Subjective Attended grandson's graduation in [place].  Only 1 seat in the whole house that was comfortable.  I was afraid of fall in the shower.   I feel like I went back a bit.  Returned on Sunday.     How long can you stand comfortably? I sit to shave but I think I could do it, I could stand to make a sandwich   Currently in Pain? Yes   Pain Score 4    Pain Location Knee   Pain Orientation Left            [facility] PT Assessment - 11/17/15 0001    Strength   Overall Strength Comments LEs grossly 4/5, trunk strength 4/5   6 Minute Walk- Baseline   6 Minute Walk- Baseline yes  480 feet   02 Sat (%RA) 92 %   Berg Balance Test   Sit to Stand  Able to stand without using hands and stabilize independently   Standing Unsupported Able to stand safely 2 minutes   Sitting with Back Unsupported but Feet Supported on Floor or Stool Able to sit safely and securely 2 minutes   Stand to Sit Sits safely with minimal use of hands   Transfers Able to transfer safely, minor use of hands   Standing Unsupported with Eyes Closed Able to stand 10 seconds with supervision   Standing Ubsupported with Feet Together Able to place feet together independently and stand for 1 minute with supervision   From Standing, Reach Forward with Outstretched Arm Can reach forward >12 cm safely (5")   From Standing Position, Pick up Object from [place] to pick up shoe safely and easily   From Standing Position, Turn to Look Behind Over each Shoulder Looks behind one side only/other side shows less weight shift   Turn 360 Degrees Able to turn 360 degrees safely but slowly   Standing Unsupported, Alternately Place Feet on Step/Stool Able to stand independently and safely and complete 8 steps in 20 seconds   Standing Unsupported, One Foot in Front Able to plae foot ahead of the other independently and hold 30 seconds   Standing on One Leg Tries to lift leg/unable to hold 3 seconds but remains standing independently   Total Score 46   Timed Up and Go Test   Manual TUG (seconds) 14.5  diff with sit to stand                     [facility] Adult PT Treatment/Exercise - 11/17/15 0001    Lumbar Exercises: Seated   Other Seated Lumbar Exercises review of current HEP   Knee/Hip Exercises: Aerobic   Nustep L2 10 min, 0.42 mph, seat#11, arms 10  unable to increase distance               Plan - 11/27/15 0732    Clinical Impression Statement The patient is progressing with rehab goals however his endurance for standing and walking remains limited.  He requires several sitting rest breaks to complete the Six Minute Walk Test.    His oxygen saturation levels  continue to drop to 88-90% with ambulation  but recovers to 94% at a faster rate.  His BERG score has improved to 46/56 but still indicates a 50% risk of falls and the need for cane use for indoor and outdoor ambulation.  His Timed up and Go test was  slowed with re-testing by knee pain with sit to stand.  Trunk and LE strength grossly 4/5.  The patient would benefit from additional PT for further progression of ambulation distance, standing tolerance, balance and strengthening.     Rehab Potential Good   Clinical Impairments Affecting Rehab Potential A fib;  Right TKR; lumbar spinal stenosis   PT Frequency 2x / week   PT Duration 6 weeks   PT Treatment/Interventions ADLs/Self Care Home Management;Electrical Stimulation;Moist Heat;Therapeutic exercise;Ultrasound;Patient/family education;Manual techniques;Taping;Dry needling;Gait training;Functional mobility training;Neuromuscular re-education   PT Next Visit Plan Continue balance, strength and endurance progression             PT Short Term Goals - 10/27/15 1319    PT SHORT TERM GOAL #1   Title The patient will have improved HS muscle length to 70 degrees needed for greater ease getting in and out of the car.  10/20/15   Time 4   Period Weeks   Status Partially Met   PT SHORT TERM GOAL #2   Title The patient will have improved trunk and LE strength be able to stand 3 min for grooming, dressing tasks   Status Achieved   PT SHORT TERM GOAL #3   Title The patient will be able to ambulate 500 feet with SPC and min to moderate back and knee pain needed for short distance community ambulation to church, Gaffer, symphony   Time 4   Period Weeks   Status Partially Met   PT SHORT TERM GOAL #4   Title The patient will have improved hip flexor muscle length to 10 degrees needed for walking longer distances with decreased energy expenditure   Status Achieved   PT SHORT TERM GOAL #5   Title Timed up and go test improved to 14 sec indicating  improved gait speed and safety   Status Achieved           PT Long Term Goals - 11/17/15 1221    PT LONG TERM GOAL #1   Title The patient will be independent with safe self progression of HEP to improved walking distance and further conditioning   11/17/15   Time 8   Period Weeks   Status On-going   PT LONG TERM GOAL #2   Title The patient will have improved core and hip strength to grossly 4+/5 holding trunk/head upright needed to stand for 5 min for kitchen work, combing hair  and shaving   Time 8   Period Weeks   Status Revised   PT LONG TERM GOAL #3   Title The patient will be able to ambulate 700 feet with SPC with min to moderate back and knee pain for ambulating longer community distances to the symphony, church and [employer].     Time 8   Period Weeks   Status On-going   PT LONG TERM GOAL #4   Title The patient will have improved Timed up and Go test to 12 sec indicating improved gait speed and safety    Status Achieved   PT LONG TERM GOAL #5   Title Patient will have improved LE strength needed for 5x sit to stand test < 13 sec   Status Achieved   Additional Long Term Goals   Additional Long Term Goals Yes   PT LONG TERM GOAL #6   Title BERG balance test of at least 48/56 indicating lower risk of falls    Status Revised   PT LONG TERM GOAL #7   Title  Six minute walk test to 560 feet indicating improved gait safety, speed and endurance   Time 6   Period Weeks   Status New             Patient will benefit from skilled therapeutic intervention in order to improve the following deficits and impairments:     Visit Diagnosis: Muscle weakness (generalized) - Plan: PT plan of care cert/re-cert  Other abnormalities of gait and mobility - Plan: PT plan of care cert/re-cert  Bilateral low back pain without sciatica - Plan: PT plan of care cert/re-cert  Pain in left knee - Plan: PT plan of care cert/re-cert     Problem List Patient Active Problem List    Diagnosis Date Noted  . Paroxysmal (Persistent) atrial fibrillation ([place]) 10/01/2015  . Pneumonia   . Pneumonia due to organism   . Fever   . Acute respiratory failure ([place])   . Persistent atrial fibrillation ([place])   . Exertional dyspnea and fatigue 08/13/2013  . CAD S/P percutaneous coronary angioplasty   . Hyperlipidemia with target LDL less than 70   . Essential hypertension   . Obesity (BMI 30.0-34.9)   . Myocardial infarction; history of   . Osteoarthritis of right knee 05/11/2012    Ruben Im, PT 11/17/2015 2:02 PM Phone: [phone] Fax: [phone]  Alvera Singh 11/17/2015, 2:01 PM  [facility] [address] [place], [place], [address] Phone: [phone]   Fax:  [phone]  Name: GUSTAVO DISPENZA MRN: [id number] Date of Birth: [birth year]

## 2015-11-23 ENCOUNTER — Ambulatory Visit: Payer: Medicare Other

## 2015-11-23 DIAGNOSIS — R2689 Other abnormalities of gait and mobility: Secondary | ICD-10-CM | POA: Diagnosis not present

## 2015-11-23 DIAGNOSIS — M25562 Pain in left knee: Secondary | ICD-10-CM | POA: Diagnosis not present

## 2015-11-23 DIAGNOSIS — M6281 Muscle weakness (generalized): Secondary | ICD-10-CM | POA: Diagnosis not present

## 2015-11-23 DIAGNOSIS — M545 Low back pain, unspecified: Secondary | ICD-10-CM

## 2015-11-23 NOTE — Therapy (Signed)
[facility] [address] [place], [place], [address] Phone: [phone]   Fax:  [phone]  Physical Therapy Treatment  Patient Details  Name: Jerry [name] MRN: [id number] Date of Birth: September 13, 1929 Referring Provider: Dr. Alroy Dust  Encounter Date: 11/23/2015      PT End of Session - 11/23/15 1135    Visit Number 14   Number of Visits 20   Date for PT Re-Evaluation 12/29/15   Authorization Type Medicare G codes;  KX at visit 15   PT Start Time 1054   PT Stop Time 1135   PT Time Calculation (min) 41 min   Activity Tolerance Patient tolerated treatment well   Behavior During Therapy [facility] for tasks assessed/performed      Past Medical History  Diagnosis Date  . Hypertension   . Peripheral edema   . Hyperlipidemia   . CAD S/P percutaneous coronary angioplasty 1996, 2009, 01/2010    PTCA of L Cx 1996; BMS-RCA [facility]) 2009; Staged PCI RCA (70% ISR) --> 3.0 mm x 23 mm BMS, staged PCI Diag - 2.0 mm x 12 mm Mini Vision BMS (2.25 mm)  . Myocardial infarction [facility]) 1996, 2009    x 2;last time was about 8-44yrs ago   . Osteoarthritis of both knees   . Chronic back pain     scoliosis--pt states unable to have surgery bc of age  . GERD (gastroesophageal reflux disease)     takes Nexium daily  . History of colon polyps   . Prostate cancer ([place]) 2005    seed implant  . Cataracts, bilateral     immature  . Anxiety   . Obesity (BMI 30.0-34.9)   . OSA (obstructive sleep apnea)   . Persistent atrial fibrillation [facility])     Past Surgical History  Procedure Laterality Date  . Right knee arthrsoscopy  29yrs ago  . Cyst removed  in college  . Coronary angioplasty with stent placement  1996, 2009, August 2011    PTCA of L Cx 1996; BMS-RCA [facility]) 2009; Staged PCI RCA (70% ISR) --> 3.0 mm x 23 mm BMS, staged PCI Diag - 2.0 mm x 12 mm Mini Vision BMS (2.25 mm)  . Wisdom teeth extraccted    . Colonoscopy    . Seeds  placed into prostate   2005  . Cholecystectomy    . Neuroplasty / transposition median nerve at carpal tunnel bilateral    . Right trigger finger    . Total knee arthroplasty  05/09/2012    Procedure: TOTAL KNEE ARTHROPLASTY;  Surgeon: Kerin Salen, MD;  Location: [place];  Service: Orthopedics;  Laterality: Right;  . Transthoracic echocardiogram  09/26/2012    EF 55-60%. Grade 2 diastolic dysfunction with moderate concentric LVH. Aortic sclerosis.  [name] Nm myoview ltd  November 2013    EF 53%; fixed mid inferior-inferolateral defect, infarct with no ischemia.  [name] Nm myoview ltd  11/24/2010    ejection fraction 58%,left ventricle is normal size ,no evidence of inducible myocardial ischemia  . Cardioversion N/A 06/12/2015    Procedure: CARDIOVERSION;  Surgeon: Sanda Klein, MD;  [facility];  Service: Cardiovascular;  Laterality: N/A;  . Tee without cardioversion N/A 06/12/2015    Procedure: TRANSESOPHAGEAL ECHOCARDIOGRAM (TEE);  Surgeon: Sanda Klein, MD;  Location: [facility] ENDOSCOPY;  Service: Cardiovascular;  Laterality: N/A;  . Cardioversion N/A 08/02/2015    Procedure: CARDIOVERSION;  Surgeon: [name], MD;  Location: [place];  Service: Cardiovascular;  Laterality:  N/A;    There were no vitals filed for this visit.      Subjective Assessment - 11/23/15 1105    Subjective Lt knee is hurting today.  Pt is doing exercises at home sometimes.     Patient Stated Goals be able to have more stamina;  participate in household chores (plan and cook meals);  get in /out of the car and walk into church, cinema, symphony   Currently in Pain? Yes   Pain Score 3    Pain Location Knee   Pain Orientation Left   Pain Descriptors / Indicators Aching;Sore   Pain Type Chronic pain   Pain Frequency Constant   Aggravating Factors  walking, position   Pain Relieving Factors non-weightbearing                         [facility] Adult PT Treatment/Exercise - 11/23/15 0001    Lumbar  Exercises: Stretches   Active Hamstring Stretch 3 reps;30 seconds   Knee/Hip Exercises: Aerobic   Nustep L2 10 min,, seat#11, arms 10  Lt knee pain with this today   Knee/Hip Exercises: Standing   Hip Abduction Stengthening;Both;2 sets;10 reps   Hip Extension Stengthening;Both;2 sets;10 reps   Other Standing Knee Exercises step taps alternating x 1 min with slighly support 1 UE for balance  2 sets   Other Standing Knee Exercises mini squats 2x10   Knee/Hip Exercises: Seated   Marching Limitations 2x10 seated                  PT Short Term Goals - 11/23/15 1126    PT SHORT TERM GOAL #2   Title The patient will have improved trunk and LE strength be able to stand 3 min for grooming, dressing tasks   Status Achieved   PT SHORT TERM GOAL #3   Title The patient will be able to ambulate 500 feet with SPC and min to moderate back and knee pain needed for short distance community ambulation to church, [employer], symphony   Time 4   Period Weeks   Status On-going   PT SHORT TERM GOAL #4   Title The patient will have improved hip flexor muscle length to 10 degrees needed for walking longer distances with decreased energy expenditure   Time 4   Period Weeks   Status Achieved   PT SHORT TERM GOAL #5   Status Achieved           PT Long Term Goals - 11/23/15 1127    PT LONG TERM GOAL #1   Title The patient will be independent with safe self progression of HEP to improved walking distance and further conditioning   11/17/15   Time 8   Period Weeks   Status On-going   PT LONG TERM GOAL #4   Title The patient will have improved Timed up and Go test to 12 sec indicating improved gait speed and safety    Time 8   Period Weeks   Status On-going               Plan - 11/23/15 1127    Clinical Impression Statement Pt reports that he is using his cane less in the house now due to improved stabiltiy. Berg Balance test performed last session was 46/56 and TUG was 14.5  seconds.  Pt is becoming more compliant in his HEP.  Pt with continued balance, strength and endurance deficits of a chronic nature and  will continue to benefit from skilled PT for progression of balance, strength and endurance tasks to improve safety at home and in the community.     Rehab Potential Good   Clinical Impairments Affecting Rehab Potential A fib;  Right TKR; lumbar spinal stenosis   PT Frequency 2x / week   PT Duration 8 weeks   PT Treatment/Interventions ADLs/Self Care Home Management;Electrical Stimulation;Moist Heat;Therapeutic exercise;Ultrasound;Patient/family education;Manual techniques;Taping;Dry needling;Gait training;Functional mobility training;Neuromuscular re-education   PT Next Visit Plan Continue balance, strength and endurance progression   Consulted and Agree with Plan of Care Patient      Patient will benefit from skilled therapeutic intervention in order to improve the following deficits and impairments:  Pain, Decreased balance, Decreased activity tolerance, Decreased strength, Decreased mobility, Difficulty walking  Visit Diagnosis: Muscle weakness (generalized)  Other abnormalities of gait and mobility  Bilateral low back pain without sciatica     Problem List Patient Active Problem List   Diagnosis Date Noted  . Paroxysmal (Persistent) atrial fibrillation ([place]) 10/01/2015  . Pneumonia   . Pneumonia due to organism   . Fever   . Acute respiratory failure ([place])   . Persistent atrial fibrillation ([place])   . Exertional dyspnea and fatigue 08/13/2013  . CAD S/P percutaneous coronary angioplasty   . Hyperlipidemia with target LDL less than 70   . Essential hypertension   . Obesity (BMI 30.0-34.9)   . Myocardial infarction; history of   . Osteoarthritis of right knee 05/11/2012     Jerry [name], PT 11/23/2015 11:37 AM  [facility] [address] [place], [place], [address] Phone:  [phone]   Fax:  [phone]  Name: Jerry [name] MRN: [id number] Date of Birth: 1929/11/27

## 2015-11-27 ENCOUNTER — Ambulatory Visit: Payer: Medicare Other | Admitting: Physical Therapy

## 2015-11-27 DIAGNOSIS — M545 Low back pain, unspecified: Secondary | ICD-10-CM

## 2015-11-27 DIAGNOSIS — M6281 Muscle weakness (generalized): Secondary | ICD-10-CM | POA: Diagnosis not present

## 2015-11-27 DIAGNOSIS — R2689 Other abnormalities of gait and mobility: Secondary | ICD-10-CM | POA: Diagnosis not present

## 2015-11-27 DIAGNOSIS — M25562 Pain in left knee: Secondary | ICD-10-CM

## 2015-11-27 NOTE — Progress Notes (Signed)
46/56 

## 2015-11-27 NOTE — Therapy (Signed)
[facility] [address] [place], [place], [address] Phone: [phone]   Fax:  [phone]  Physical Therapy Treatment  Patient Details  Name: Jerry [name] MRN: [id number] Date of Birth: December 15, 1929 Referring Provider: Dr. Alroy Dust  Encounter Date: 11/27/2015      PT End of Session - 11/27/15 1124    Visit Number 15   Number of Visits 20   Date for PT Re-Evaluation 12/29/15   Authorization Type Medicare G codes;  KX at visit 15   PT Start Time 1100   PT Stop Time 1145   PT Time Calculation (min) 45 min   Activity Tolerance Patient tolerated treatment well      Past Medical History  Diagnosis Date  . Hypertension   . Peripheral edema   . Hyperlipidemia   . CAD S/P percutaneous coronary angioplasty 1996, 2009, 01/2010    PTCA of L Cx 1996; BMS-RCA [facility]) 2009; Staged PCI RCA (70% ISR) --> 3.0 mm x 23 mm BMS, staged PCI Diag - 2.0 mm x 12 mm Mini Vision BMS (2.25 mm)  . Myocardial infarction [facility]) 1996, 2009    x 2;last time was about 8-20yr ago   . Osteoarthritis of both knees   . Chronic back pain     scoliosis--pt states unable to have surgery bc of age  . GERD (gastroesophageal reflux disease)     takes Nexium daily  . History of colon polyps   . Prostate cancer ([place] 2005    seed implant  . Cataracts, bilateral     immature  . Anxiety   . Obesity (BMI 30.0-34.9)   . OSA (obstructive sleep apnea)   . Persistent atrial fibrillation ([facility]     Past Surgical History  Procedure Laterality Date  . Right knee arthrsoscopy  [birth year][place][age]  . Cyst removed  in college  . Coronary angioplasty with stent placement  1996, 2009, August 2011    PTCA of L Cx 1996; [facility]; Staged PCI RCA (70% ISR) --> 3.0 mm x 23 mm BMS, staged PCI Diag - 2.0 mm x 12 mm Mini Vision BMS (2.25 mm)  . Wisdom teeth extraccted    . Colonoscopy    . Seeds placed into prostate   2005  . Cholecystectomy    .  Neuroplasty / transposition median nerve at carpal tunnel bilateral    . Right trigger finger    . Total knee arthroplasty  05/09/2012    Procedure: TOTAL KNEE ARTHROPLASTY;  Surgeon: FrKerin SalenMD;  [facility] Service: [facility]ics;  Laterality: Right;  . Transthoracic echocardiogram  09/26/2012    EF 55-60%. Grade 2 diastolic dysfunction with moderate concentric LVH. Aortic sclerosis.  . Marland Kitchenm myoview ltd  November 2013    EF 53%; fixed mid inferior-inferolateral defect, infarct with no ischemia.  . Marland Kitchenm myoview ltd  11/24/2010    ejection fraction 58%,left ventricle is normal size ,no evidence of inducible myocardial ischemia  . Cardioversion N/A 06/12/2015    Procedure: CARDIOVERSION;  Surgeon: MiSanda KleinMD;  [facility];  Service: Cardiovascular;  Laterality: N/A;  . Tee without cardioversion N/A 06/12/2015    Procedure: TRANSESOPHAGEAL ECHOCARDIOGRAM (TEE);  Surgeon: MiSanda KleinMD;  [facility];  Service: Cardiovascular;  Laterality: N/A;  . Cardioversion N/A 08/02/2015    Procedure: CARDIOVERSION;  Surgeon: JaThompson GrayerMD;  [facility] Service: Cardiovascular;  Laterality: N/A;    There were no vitals filed  for this visit.      Subjective Assessment - 11/27/15 1110    Subjective States his left knee is irritated by Nu-Step.     Currently in Pain? Yes   Pain Score 4    Pain Location Knee   Pain Orientation Left   Pain Type Chronic pain            [facility] PT Assessment - 11/27/15 0001    Timed Up and Go Test   Manual TUG (seconds) 14.5  diff with sit to stand                     [facility] Adult PT Treatment/Exercise - 11/27/15 1125    Lumbar Exercises: Stretches   Active Hamstring Stretch 3 reps;30 seconds  on stairs   Knee/Hip Exercises: Aerobic   Nustep L2 9 min  discontinued early secondary left knee pain   Knee/Hip Exercises: Standing   Heel Raises Both;10 reps   Hip Abduction Stengthening;Right;Left;1 set;10  reps  yellow   Hip Extension Stengthening;Both;10 reps;1 set   SLS Hip Extension isometrics 5x 5 sec holds right/left   Walking with Sports Cord 15 # backwards walking  5x   Other Standing Knee Exercises step taps without UE support   Other Standing Knee Exercises cruising side stepping 5x                  PT Short Term Goals - 11/27/15 1158    PT SHORT TERM GOAL #1   Title The patient will have improved HS muscle length to 70 degrees needed for greater ease getting in and out of the car.  10/20/15   Time 4   Period Weeks   Status Partially Met   PT SHORT TERM GOAL #2   Title The patient will have improved trunk and LE strength be able to stand 3 min for grooming, dressing tasks   Status Achieved   PT SHORT TERM GOAL #3   Title The patient will be able to ambulate 500 feet with SPC and min to moderate back and knee pain needed for short distance community ambulation to church, [employer], symphony   Time 4   Period Weeks   Status On-going   PT SHORT TERM GOAL #4   Title The patient will have improved hip flexor muscle length to 10 degrees needed for walking longer distances with decreased energy expenditure   Status Achieved   PT SHORT TERM GOAL #5   Title Timed up and go test improved to 14 sec indicating improved gait speed and safety   Status Achieved           PT Long Term Goals - 11/27/15 1159    PT LONG TERM GOAL #1   Title The patient will be independent with safe self progression of HEP to improved walking distance and further conditioning   11/17/15   Time 8   Period Weeks   Status On-going   PT LONG TERM GOAL #2   Title The patient will have improved core and hip strength to grossly 4+/5 holding trunk/head upright needed to stand for 5 min for kitchen work, combing hair  and shaving   Time 8   Period Weeks   Status On-going   PT LONG TERM GOAL #3   Title The patient will be able to ambulate 700 feet with SPC with min to moderate back and knee pain  for ambulating longer community distances to the symphony, church and movie  theatre.     Time 8   Period Weeks   Status On-going   PT LONG TERM GOAL #4   Title The patient will have improved Timed up and Go test to 12 sec indicating improved gait speed and safety    Time 8   Period Weeks   Status On-going   PT LONG TERM GOAL #5   Title Patient will have improved LE strength needed for 5x sit to stand test < 13 sec   Status Achieved   PT LONG TERM GOAL #6   Title BERG balance test of at least 48/56 indicating lower risk of falls    Time 8   Period Weeks   Status On-going   PT LONG TERM GOAL #7   Title Six minute walk test to 560 feet indicating improved gait safety, speed and endurance   Time 6   Period Weeks   Status On-going               Plan - 11/27/15 1139    Clinical Impression Statement The patient is able to tolerate 3-5 minute bouts of standing before the onset of LE muscular fatigue resulting in the need to sit.  Oxygen saturation drops slightly to 92% but recovers quickly to 94%.  Left knee pain does limit his ability to single leg stand/full weight bear.   Therapist modifying treatment for pain, monitoring respiration and providing contact guard assist for safety in standing.     PT Next Visit Plan Continue balance, strength and endurance progression;  gait endurance;  KX modifiers      Patient will benefit from skilled therapeutic intervention in order to improve the following deficits and impairments:     Visit Diagnosis: Muscle weakness (generalized)  Other abnormalities of gait and mobility  Bilateral low back pain without sciatica  Pain in left knee     Problem List Patient Active Problem List   Diagnosis Date Noted  . Paroxysmal (Persistent) atrial fibrillation ([place]) 10/01/2015  . Pneumonia   . Pneumonia due to organism   . Fever   . Acute respiratory failure ([place])   . Persistent atrial fibrillation ([place])   . Exertional dyspnea and fatigue  08/13/2013  . CAD S/P percutaneous coronary angioplasty   . Hyperlipidemia with target LDL less than 70   . Essential hypertension   . Obesity (BMI 30.0-34.9)   . Myocardial infarction; history of   . Osteoarthritis of right knee 05/11/2012   Ruben Im, PT 11/27/2015 12:02 PM Phone: [phone] Fax: [phone] Alvera Singh 11/27/2015, 12:01 PM  [facility] [address] [place], [place], [address] Phone: [phone]   Fax:  [phone]  Name: JAROM GOVAN MRN: [id number] Date of Birth: 1930/05/21

## 2015-11-30 ENCOUNTER — Other Ambulatory Visit: Payer: Self-pay | Admitting: Cardiology

## 2015-11-30 ENCOUNTER — Ambulatory Visit: Payer: Medicare Other

## 2015-11-30 DIAGNOSIS — M6281 Muscle weakness (generalized): Secondary | ICD-10-CM | POA: Diagnosis not present

## 2015-11-30 DIAGNOSIS — M545 Low back pain, unspecified: Secondary | ICD-10-CM

## 2015-11-30 DIAGNOSIS — M25562 Pain in left knee: Secondary | ICD-10-CM

## 2015-11-30 DIAGNOSIS — R2689 Other abnormalities of gait and mobility: Secondary | ICD-10-CM | POA: Diagnosis not present

## 2015-11-30 NOTE — Therapy (Signed)
[facility] [address] [place], [place], [address] Phone: [phone]   Fax:  [phone]  Physical Therapy Treatment  Patient Details  Name: Jerry [name] MRN: [id number] Date of Birth: 1929/10/24 Referring Provider: Dr. Alroy Dust  Encounter Date: 11/30/2015      PT End of Session - 11/30/15 1053    Visit Number 16   Number of Visits 20   Date for PT Re-Evaluation 12/29/15   Authorization Type Medicare G codes;  KX at visit 15   PT Start Time 1015   PT Stop Time 1057   PT Time Calculation (min) 42 min   Activity Tolerance Patient tolerated treatment well   Behavior During Therapy [facility] for tasks assessed/performed      Past Medical History  Diagnosis Date  . Hypertension   . Peripheral edema   . Hyperlipidemia   . CAD S/P percutaneous coronary angioplasty 1996, 2009, 01/2010    PTCA of L Cx 1996; BMS-RCA [facility]) 2009; Staged PCI RCA (70% ISR) --> 3.0 mm x 23 mm BMS, staged PCI Diag - 2.0 mm x 12 mm Mini Vision BMS (2.25 mm)  . Myocardial infarction [facility]) 1996, 2009    x 2;last time was about 8-42yrs ago   . Osteoarthritis of both knees   . Chronic back pain     scoliosis--pt states unable to have surgery bc of age  . GERD (gastroesophageal reflux disease)     takes Nexium daily  . History of colon polyps   . Prostate cancer ([place]) 2005    seed implant  . Cataracts, bilateral     immature  . Anxiety   . Obesity (BMI 30.0-34.9)   . OSA (obstructive sleep apnea)   . Persistent atrial fibrillation [facility])     Past Surgical History  Procedure Laterality Date  . Right knee arthrsoscopy  20yrs ago  . Cyst removed  in college  . Coronary angioplasty with stent placement  1996, 2009, August 2011    PTCA of L Cx 1996; BMS-RCA [facility]) 2009; Staged PCI RCA (70% ISR) --> 3.0 mm x 23 mm BMS, staged PCI Diag - 2.0 mm x 12 mm Mini Vision BMS (2.25 mm)  . Wisdom teeth extraccted    . Colonoscopy    . Seeds  placed into prostate   2005  . Cholecystectomy    . Neuroplasty / transposition median nerve at carpal tunnel bilateral    . Right trigger finger    . Total knee arthroplasty  05/09/2012    Procedure: TOTAL KNEE ARTHROPLASTY;  Surgeon: Kerin Salen, MD;  Location: [place];  Service: Orthopedics;  Laterality: Right;  . Transthoracic echocardiogram  09/26/2012    EF 55-60%. Grade 2 diastolic dysfunction with moderate concentric LVH. Aortic sclerosis.  [name] Nm myoview ltd  November 2013    EF 53%; fixed mid inferior-inferolateral defect, infarct with no ischemia.  [name] Nm myoview ltd  11/24/2010    ejection fraction 58%,left ventricle is normal size ,no evidence of inducible myocardial ischemia  . Cardioversion N/A 06/12/2015    Procedure: CARDIOVERSION;  Surgeon: Sanda Klein, MD;  [facility];  Service: Cardiovascular;  Laterality: N/A;  . Tee without cardioversion N/A 06/12/2015    Procedure: TRANSESOPHAGEAL ECHOCARDIOGRAM (TEE);  Surgeon: Sanda Klein, MD;  Location: [facility] ENDOSCOPY;  Service: Cardiovascular;  Laterality: N/A;  . Cardioversion N/A 08/02/2015    Procedure: CARDIOVERSION;  Surgeon: [name], MD;  Location: [place];  Service: Cardiovascular;  Laterality:  N/A;    There were no vitals filed for this visit.      Subjective Assessment - 11/30/15 1022    Subjective Felt good after last session.     Patient Stated Goals be able to have more stamina;  participate in household chores (plan and cook meals);  get in /out of the car and walk into church, cinema, symphony   Pain Score 4    Pain Location Knee   Pain Orientation Left   Pain Descriptors / Indicators Aching;Sore   Pain Type Chronic pain   Pain Onset Other (comment)   Pain Frequency Constant   Aggravating Factors  walking   Pain Relieving Factors non-weight bearing                         [facility] Adult PT Treatment/Exercise - 11/30/15 0001    Lumbar Exercises: Stretches   Active Hamstring Stretch  3 reps;30 seconds  seated   Knee/Hip Exercises: Aerobic   Nustep L2 10 min   Knee/Hip Exercises: Standing   Hip Flexion Stengthening;Both;2 sets;10 reps   Hip Abduction Stengthening;Right;Left;10 reps;2 sets  yellow   Hip Extension Stengthening;Both;10 reps;2 sets   Other Standing Knee Exercises step taps with UE support 2x20    Knee/Hip Exercises: Seated   Marching Limitations 2x10 seated   Abduction/Adduction  Strengthening;Both;2 sets;10 reps                PT Education - 11/30/15 1043    Education provided Yes   Education Details HEP: standing hip abduction, extension, and marching   Person(s) Educated Patient   Methods Explanation;Handout;Demonstration   Comprehension Verbalized understanding          PT Short Term Goals - 11/30/15 1023    PT SHORT TERM GOAL #3   Title The patient will be able to ambulate 500 feet with SPC and min to moderate back and knee pain needed for short distance community ambulation to church, G[employer], symphony   Time 4   Period Weeks   Status On-going   PT SHORT TERM GOAL #5   Title Timed up and go test improved to 14 sec indicating improved gait speed and safety   Status Achieved           PT Long Term Goals - 11/30/15 1029    PT LONG TERM GOAL #1   Title The patient will be independent with safe self progression of HEP to improved walking distance and further conditioning   11/17/15   Time 8   Period Weeks   Status On-going   PT LONG TERM GOAL #4   Title The patient will have improved Timed up and Go test to 12 sec indicating improved gait speed and safety    Time 8   Period Weeks   Status On-going   PT LONG TERM GOAL #5   Title Patient will have improved LE strength needed for 5x sit to stand test < 13 sec   Status Achieved               Plan - 11/30/15 1031    Clinical Impression Statement Pt is able to tolerates standing for 3-5 minutes before muscular fatigue and LBP/Lt knee pian.  PT issued HEP for  strength and endurance in standing today.  Pt O2 sats dropped to 94% with exercise today and recovered quickly with short rest.  Pt  with continued limited endurance and strength of a chronic nature.  Pt will continue to benefit from skilled PT for strength and endurance prgression.     Rehab Potential Good   Clinical Impairments Affecting Rehab Potential A fib;  Right TKR; lumbar spinal stenosis   PT Frequency 2x / week   PT Duration 6 weeks   PT Treatment/Interventions ADLs/Self Care Home Management;Electrical Stimulation;Moist Heat;Therapeutic exercise;Ultrasound;Patient/family education;Manual techniques;Taping;Dry needling;Gait training;Functional mobility training;Neuromuscular re-education   PT Next Visit Plan Continue balance, strength and endurance progression;  gait endurance;  KX modifiers.  Review HEP issued today.   Consulted and Agree with Plan of Care Patient      Patient will benefit from skilled therapeutic intervention in order to improve the following deficits and impairments:  Pain, Decreased balance, Decreased activity tolerance, Decreased strength, Decreased mobility, Difficulty walking  Visit Diagnosis: Muscle weakness (generalized)  Other abnormalities of gait and mobility  Bilateral low back pain without sciatica  Pain in left knee     Problem List Patient Active Problem List   Diagnosis Date Noted  . Paroxysmal (Persistent) atrial fibrillation ([place]) 10/01/2015  . Pneumonia   . Pneumonia due to organism   . Fever   . Acute respiratory failure ([place])   . Persistent atrial fibrillation ([place])   . Exertional dyspnea and fatigue 08/13/2013  . CAD S/P percutaneous coronary angioplasty   . Hyperlipidemia with target LDL less than 70   . Essential hypertension   . Obesity (BMI 30.0-34.9)   . Myocardial infarction; history of   . Osteoarthritis of right knee 05/11/2012    Sigurd Sos, PT 11/30/2015 10:57 AM  [facility] [address] [place], [place], [address] Phone: [phone]   Fax:  [phone]  Name: Jerry [name] MRN: [id number] Date of Birth: Dec 01, 1929

## 2015-11-30 NOTE — Telephone Encounter (Signed)
Rx(s) sent to pharmacy electronically.  

## 2015-11-30 NOTE — Patient Instructions (Signed)
Knee High   Holding stable object, raise knee to hip level, then lower knee. Repeat with other knee. Complete __10_ repetitions. Do __2__ sessions per day.  ABDUCTION: Standing (Active)   Stand, feet flat. Lift right leg out to side. Use _0__ lbs. Complete __10_ repetitions. Perform __2_ sessions per day.    EXTENSION: Standing (Active)  Stand, both feet flat. Draw right leg behind body as far as possible. Use 0___ lbs. Complete 10 repetitions. Perform __2_ sessions per day.  Copyright  VHI. All rights reserved.   [facility] [address], [address] [place], [place] [address] Phone # [phone] Fax [phone] 

## 2015-12-02 ENCOUNTER — Ambulatory Visit: Payer: Medicare Other | Admitting: Physical Therapy

## 2015-12-02 ENCOUNTER — Encounter: Payer: Self-pay | Admitting: Physical Therapy

## 2015-12-02 DIAGNOSIS — M545 Low back pain, unspecified: Secondary | ICD-10-CM

## 2015-12-02 DIAGNOSIS — M6281 Muscle weakness (generalized): Secondary | ICD-10-CM | POA: Diagnosis not present

## 2015-12-02 DIAGNOSIS — M25562 Pain in left knee: Secondary | ICD-10-CM

## 2015-12-02 DIAGNOSIS — R2689 Other abnormalities of gait and mobility: Secondary | ICD-10-CM | POA: Diagnosis not present

## 2015-12-02 NOTE — Therapy (Signed)
[facility] [address] [place], [place], [address] Phone: [phone]   Fax:  [phone]  Physical Therapy Treatment  Patient Details  Name: Jerry [name] MRN: [id number] Date of Birth: [birth year] Referring Provider: Dr. Alroy Dust  Encounter Date: 12/02/2015      PT End of Session - 12/02/15 1117    Visit Number 17   Number of Visits 20   Date for PT Re-Evaluation 12/29/15   Authorization Type Medicare G codes;  KX at visit 15   PT Start Time 1101   PT Stop Time 1140   PT Time Calculation (min) 39 min   Activity Tolerance Patient tolerated treatment well   Behavior During Therapy [facility] for tasks assessed/performed      Past Medical History  Diagnosis Date  . Hypertension   . Peripheral edema   . Hyperlipidemia   . CAD S/P percutaneous coronary angioplasty 1996, 2009, 01/2010    PTCA of L Cx 1996; BMS-RCA [facility]) 2009; Staged PCI RCA (70% ISR) --> 3.0 mm x 23 mm BMS, staged PCI Diag - 2.0 mm x 12 mm Mini Vision BMS (2.25 mm)  . Myocardial infarction [facility]) 1996, 2009    x 2;last time was about 8-49yrs ago   . Osteoarthritis of both knees   . Chronic back pain     scoliosis--pt states unable to have surgery bc of age  . GERD (gastroesophageal reflux disease)     takes Nexium daily  . History of colon polyps   . Prostate cancer ([place]) 2005    seed implant  . Cataracts, bilateral     immature  . Anxiety   . Obesity (BMI 30.0-34.9)   . OSA (obstructive sleep apnea)   . Persistent atrial fibrillation [facility])     Past Surgical History  Procedure Laterality Date  . Right knee arthrsoscopy  60yrs ago  . Cyst removed  in college  . Coronary angioplasty with stent placement  1996, 2009, August 2011    PTCA of L Cx 1996; BMS-RCA [facility]) 2009; Staged PCI RCA (70% ISR) --> 3.0 mm x 23 mm BMS, staged PCI Diag - 2.0 mm x 12 mm Mini Vision BMS (2.25 mm)  . Wisdom teeth extraccted    . Colonoscopy    . Seeds  placed into prostate   2005  . Cholecystectomy    . Neuroplasty / transposition median nerve at carpal tunnel bilateral    . Right trigger finger    . Total knee arthroplasty  05/09/2012    Procedure: TOTAL KNEE ARTHROPLASTY;  Surgeon: Kerin Salen, MD;  Location: [place];  Service: Orthopedics;  Laterality: Right;  . Transthoracic echocardiogram  09/26/2012    EF 55-60%. Grade 2 diastolic dysfunction with moderate concentric LVH. Aortic sclerosis.  [name] Nm myoview ltd  November 2013    EF 53%; fixed mid inferior-inferolateral defect, infarct with no ischemia.  [name] Nm myoview ltd  11/24/2010    ejection fraction 58%,left ventricle is normal size ,no evidence of inducible myocardial ischemia  . Cardioversion N/A 06/12/2015    Procedure: CARDIOVERSION;  Surgeon: Sanda Klein, MD;  [facility];  Service: Cardiovascular;  Laterality: N/A;  . Tee without cardioversion N/A 06/12/2015    Procedure: TRANSESOPHAGEAL ECHOCARDIOGRAM (TEE);  Surgeon: Sanda Klein, MD;  Location: [facility] ENDOSCOPY;  Service: Cardiovascular;  Laterality: N/A;  . Cardioversion N/A 08/02/2015    Procedure: CARDIOVERSION;  Surgeon: [name], MD;  Location: [place];  Service: Cardiovascular;  Laterality:  N/A;    There were no vitals filed for this visit.      Subjective Assessment - 12/02/15 1119    Subjective No new complaints.   Currently in Pain? Yes   Pain Score 3    Pain Location Knee   Pain Orientation Left   Pain Descriptors / Indicators Sore   Aggravating Factors  Weightbearing   Pain Relieving Factors Nonweightbearing   Multiple Pain Sites No                         [facility] Adult PT Treatment/Exercise - 12/02/15 0001    Knee/Hip Exercises: Aerobic   Nustep L2 x10 min   Knee/Hip Exercises: Standing   Hip Flexion Stengthening;Both;2 sets;15 reps   Hip Abduction Stengthening;2 sets;15 reps   Hip Extension Stengthening;Both;2 sets;15 reps   Knee/Hip Exercises: Seated   Marching  Limitations 2 x15 with small roll at thoaracic   Abduction/Adduction  AROM;2 sets;15 reps                  PT Short Term Goals - 12/02/15 1133    PT SHORT TERM GOAL #3   Title The patient will be able to ambulate 500 feet with SPC and min to moderate back and knee pain needed for short distance community ambulation to church, Gaffer, symphony   Time 4   Period Weeks   Status On-going           PT Long Term Goals - 11/30/15 1029    PT LONG TERM GOAL #1   Title The patient will be independent with safe self progression of HEP to improved walking distance and further conditioning   11/17/15   Time 8   Period Weeks   Status On-going   PT LONG TERM GOAL #4   Title The patient will have improved Timed up and Go test to 12 sec indicating improved gait speed and safety    Time 8   Period Weeks   Status On-going   PT LONG TERM GOAL #5   Title Patient will have improved LE strength needed for 5x sit to stand test < 13 sec   Status Achieved               Plan - 12/02/15 1118    Clinical Impression Statement 02 was steady between 93%-96% today with exercise. Knee pain limits walking distance mainly. Standing tolerance steady with 3-5 min for exercises. He reports today is community walking is about the same.    Rehab Potential Good   Clinical Impairments Affecting Rehab Potential A fib;  Right TKR; lumbar spinal stenosis   PT Frequency 2x / week   PT Duration 6 weeks   PT Treatment/Interventions ADLs/Self Care Home Management;Electrical Stimulation;Moist Heat;Therapeutic exercise;Ultrasound;Patient/family education;Manual techniques;Taping;Dry needling;Gait training;Functional mobility training;Neuromuscular re-education   PT Next Visit Plan Continue balance, strength and endurance progression;  gait endurance;  KX modifiers.  Review HEP issued today.   Consulted and Agree with Plan of Care Patient      Patient will benefit from skilled therapeutic  intervention in order to improve the following deficits and impairments:  Pain, Decreased balance, Decreased activity tolerance, Decreased strength, Decreased mobility, Difficulty walking  Visit Diagnosis: Muscle weakness (generalized)  Other abnormalities of gait and mobility  Bilateral low back pain without sciatica  Pain in left knee     Problem List Patient Active Problem List   Diagnosis Date Noted  . Paroxysmal (Persistent) atrial  fibrillation ([place]) 10/01/2015  . Pneumonia   . Pneumonia due to organism   . Fever   . Acute respiratory failure ([place])   . Persistent atrial fibrillation ([place])   . Exertional dyspnea and fatigue 08/13/2013  . CAD S/P percutaneous coronary angioplasty   . Hyperlipidemia with target LDL less than 70   . Essential hypertension   . Obesity (BMI 30.0-34.9)   . Myocardial infarction; history of   . Osteoarthritis of right knee 05/11/2012    Luismiguel Lamere, PTA 12/02/2015, 11:42 AM  [facility] [address] [place], [place], [address] Phone: [phone]   Fax:  [phone]  Name: HASCAL GARROD MRN: [id number] Date of Birth: 1930/01/04

## 2015-12-05 ENCOUNTER — Other Ambulatory Visit (HOSPITAL_COMMUNITY): Payer: Self-pay | Admitting: Nurse Practitioner

## 2015-12-07 ENCOUNTER — Ambulatory Visit: Payer: Medicare Other | Attending: Family Medicine | Admitting: Physical Therapy

## 2015-12-07 ENCOUNTER — Encounter: Payer: Self-pay | Admitting: Physical Therapy

## 2015-12-07 DIAGNOSIS — R2689 Other abnormalities of gait and mobility: Secondary | ICD-10-CM | POA: Diagnosis not present

## 2015-12-07 DIAGNOSIS — M545 Low back pain, unspecified: Secondary | ICD-10-CM

## 2015-12-07 DIAGNOSIS — M25562 Pain in left knee: Secondary | ICD-10-CM | POA: Insufficient documentation

## 2015-12-07 DIAGNOSIS — M6281 Muscle weakness (generalized): Secondary | ICD-10-CM

## 2015-12-07 NOTE — Therapy (Signed)
[facility] [address], [place], [place], [address] Phone: [phone]   Fax:  [phone]  Physical Therapy Treatment  Patient Details  Name: Jerry [name] MRN: [id number] Date of Birth: Jun 20, 1929 Referring Provider: Dr. Alroy Dust  Encounter Date: 12/07/2015      PT End of Session - 12/07/15 1220    Visit Number 18   Number of Visits 20   Date for PT Re-Evaluation 12/29/15   Authorization Type Medicare G codes;  KX at visit 15   PT Start Time 1145   PT Stop Time 1225   PT Time Calculation (min) 40 min      Past Medical History  Diagnosis Date  . Hypertension   . Peripheral edema   . Hyperlipidemia   . CAD S/P percutaneous coronary angioplasty 1996, 2009, 01/2010    PTCA of L Cx 1996; BMS-RCA [facility]) 2009; Staged PCI RCA (70% ISR) --> 3.0 mm x 23 mm BMS, staged PCI Diag - 2.0 mm x 12 mm Mini Vision BMS (2.25 mm)  . Myocardial infarction [facility]) 1996, 2009    x 2;last time was about 8-27yr ago   . Osteoarthritis of both knees   . Chronic back pain     scoliosis--pt states unable to have surgery bc of age  . GERD (gastroesophageal reflux disease)     takes Nexium daily  . History of colon polyps   . [place]    seed implant  . Cataracts, bilateral     immature  . Anxiety   . Obesity (BMI 30.0-34.9)   . OSA (obstructive sleep apnea)   . Persistent atrial fibrillation ([facility]     Past Surgical History  Procedure Laterality Date  . Right knee arthrsoscopy  120yrago  . Cyst removed  in college  . Coronary angioplasty with stent placement  1996, 2009, August 2011    [facility] L Cx 1996; [facility] Staged PCI RCA (70% ISR) --> 3.0 mm x 23 mm BMS, staged PCI Diag - 2.0 mm x 12 mm Mini Vision BMS (2.25 mm)  . Wisdom teeth extraccted    . Colonoscopy    . Seeds placed into prostate   2005  . Cholecystectomy    . Neuroplasty / transposition median nerve at carpal tunnel  bilateral    . Right trigger finger    . Total knee arthroplasty  05/09/2012    Procedure: TOTAL KNEE ARTHROPLASTY;  Surgeon: FrKerin SalenMD;  [facility] Service: [facility]ics;  Laterality: Right;  . Transthoracic echocardiogram  09/26/2012    EF 55-60%. Grade 2 diastolic dysfunction with moderate concentric LVH. Aortic sclerosis.  . Marland Kitchenm myoview ltd  November 2013    EF 53%; fixed mid inferior-inferolateral defect, infarct with no ischemia.  . Marland Kitchenm myoview ltd  11/24/2010    ejection fraction 58%,left ventricle is normal size ,no evidence of inducible myocardial ischemia  . Cardioversion N/A 06/12/2015    Procedure: CARDIOVERSION;  Surgeon: MiSanda KleinMD;  [facility];  Service: Cardiovascular;  Laterality: N/A;  . Tee without cardioversion N/A 06/12/2015    Procedure: TRANSESOPHAGEAL ECHOCARDIOGRAM (TEE);  Surgeon: MiSanda KleinMD;  [facility]: [facility] Service: Cardiovascular;  Laterality: N/A;  . Cardioversion N/A 08/02/2015    Procedure: CARDIOVERSION;  Surgeon: JaThompson GrayerMD;  [facility];  Service: Cardiovascular;  Laterality: N/A;    There were no vitals filed for this visit.  Subjective Assessment - 12/07/15 1149    Subjective no new complaints, continues with Lt knee pain   Limitations Walking;House hold activities   Patient Stated Goals be able to have more stamina;  participate in household chores (plan and cook meals);  get in /out of the car and walk into church, cinema, symphony   Currently in Pain? Yes   Pain Score 3    Pain Location Knee   Pain Orientation Left   Pain Descriptors / Indicators Sore   Multiple Pain Sites No                         [facility] Adult PT Treatment/Exercise - 12/07/15 0001    Ambulation/Gait   Ambulation/Gait Yes   Ambulation/Gait Assistance 6: Modified independent (Device/Increase time)   Ambulation Distance (Feet) 100 Feet  100 x 3   Assistive device Straight cane   Gait Comments  limited by knee and back pain   Knee/Hip Exercises: Aerobic   Nustep L2 x 10 minutes   Knee/Hip Exercises: Standing   Hip Flexion Stengthening;Both;2 sets;15 reps   Hip Abduction Stengthening;Both;2 sets;15 reps   Hip Extension Stengthening;Both;2 sets;15 reps   Knee/Hip Exercises: Seated   Long Arc Quad Both;2 sets;10 reps   Long Arc Quad Weight 1 lbs.   Marching Limitations 2 x 10 with 1#   Abduction/Adduction  Strengthening;Both;20 reps                PT Education - 12/07/15 1220    Education provided Yes   Education Details suggested to get a pool noodle to assist with back pain at home as it has helped at PT   [facility]) Educated Patient   Methods Demonstration;Explanation   Comprehension Verbalized understanding          PT Short Term Goals - 12/07/15 1221    PT SHORT TERM GOAL #1   Title The patient will have improved HS muscle length to 70 degrees needed for greater ease getting in and out of the car.  10/20/15   Time 4   Period Weeks   Status Partially Met   PT SHORT TERM GOAL #2   Title The patient will have improved trunk and LE strength be able to stand 3 min for grooming, dressing tasks   Status Achieved   PT SHORT TERM GOAL #3   Title The patient will be able to ambulate 500 feet with SPC and min to moderate back and knee pain needed for short distance community ambulation to church, [employer], symphony   Time 4   Period Weeks   Status On-going   PT SHORT TERM GOAL #4   Title The patient will have improved hip flexor muscle length to 10 degrees needed for walking longer distances with decreased energy expenditure   Status Achieved   PT SHORT TERM GOAL #5   Title Timed up and go test improved to 14 sec indicating improved gait speed and safety   Status Achieved           PT Long Term Goals - 12/07/15 1222    PT LONG TERM GOAL #1   Title The patient will be independent with safe self progression of HEP to improved walking distance and  further conditioning   11/17/15   Time 8   Period Weeks   Status On-going   PT LONG TERM GOAL #2   Title The patient will have improved core and hip strength to grossly  4+/5 holding trunk/head upright needed to stand for 5 min for kitchen work, combing hair  and shaving   Time 8   Period Weeks   Status On-going   PT LONG TERM GOAL #3   Title The patient will be able to ambulate 700 feet with SPC with min to moderate back and knee pain for ambulating longer community distances to the symphony, church and [employer].     Time 8   Period Weeks   Status On-going   PT LONG TERM GOAL #4   Title The patient will have improved Timed up and Go test to 12 sec indicating improved gait speed and safety    Time 8   Period Weeks   Status On-going   PT LONG TERM GOAL #5   Title Patient will have improved LE strength needed for 5x sit to stand test < 13 sec   Status Achieved   PT LONG TERM GOAL #6   Title BERG balance test of at least 48/56 indicating lower risk of falls    Time 8   Period Weeks   Status On-going   PT LONG TERM GOAL #7   Title Six minute walk test to 560 feet indicating improved gait safety, speed and endurance   Time 6   Period Weeks   Status On-going               Plan - 12/07/15 1220    Clinical Impression Statement Pt continues to have endurance limited by knee and back pain, able to complete gait only 90 seconds at a time before needing seated rest   Rehab Potential Good   Clinical Impairments Affecting Rehab Potential A fib;  Right TKR; lumbar spinal stenosis   PT Frequency 2x / week   PT Duration 6 weeks   PT Treatment/Interventions ADLs/Self Care Home Management;Electrical Stimulation;Moist Heat;Therapeutic exercise;Ultrasound;Patient/family education;Manual techniques;Taping;Dry needling;Gait training;Functional mobility training;Neuromuscular re-education   PT Next Visit Plan continue endurance and gait training   Consulted and Agree with Plan of Care  Patient      Patient will benefit from skilled therapeutic intervention in order to improve the following deficits and impairments:  Pain, Decreased balance, Decreased activity tolerance, Decreased strength, Decreased mobility, Difficulty walking  Visit Diagnosis: Muscle weakness (generalized)  Other abnormalities of gait and mobility  Pain in left knee  Bilateral low back pain without sciatica     Problem List Patient Active Problem List   Diagnosis Date Noted  . Paroxysmal (Persistent) atrial fibrillation ([place]) 10/01/2015  . Pneumonia   . Pneumonia due to organism   . Fever   . Acute respiratory failure ([place])   . Persistent atrial fibrillation ([place])   . Exertional dyspnea and fatigue 08/13/2013  . CAD S/P percutaneous coronary angioplasty   . Hyperlipidemia with target LDL less than 70   . Essential hypertension   . Obesity (BMI 30.0-34.9)   . Myocardial infarction; history of   . Osteoarthritis of right knee 05/11/2012    Isabelle Course, PT, DPT  12/07/2015, 12:24 PM  [facility] [address], [place], [place], [address] Phone: [phone]   Fax:  [phone]  Name: Jerry [name] MRN: [id number] Date of Birth: [birth year]

## 2015-12-11 ENCOUNTER — Other Ambulatory Visit: Payer: Self-pay | Admitting: Cardiology

## 2015-12-11 ENCOUNTER — Ambulatory Visit: Payer: Medicare Other | Admitting: Physical Therapy

## 2015-12-11 DIAGNOSIS — M545 Low back pain, unspecified: Secondary | ICD-10-CM

## 2015-12-11 DIAGNOSIS — M25562 Pain in left knee: Secondary | ICD-10-CM | POA: Diagnosis not present

## 2015-12-11 DIAGNOSIS — R2689 Other abnormalities of gait and mobility: Secondary | ICD-10-CM | POA: Diagnosis not present

## 2015-12-11 DIAGNOSIS — M6281 Muscle weakness (generalized): Secondary | ICD-10-CM | POA: Diagnosis not present

## 2015-12-11 NOTE — Telephone Encounter (Signed)
Rx(s) sent to pharmacy electronically.  

## 2015-12-11 NOTE — Therapy (Signed)
[facility] [address], [place], [place], [address] Phone: [phone]   Fax:  [phone]  Physical Therapy Treatment  Patient Details  Name: Jerry [name] MRN: [id number] Date of Birth: 09-26-1929 Referring Provider: Dr. Alroy Dust  Encounter Date: 12/11/2015      PT End of Session - 12/11/15 1131    Visit Number 19   Number of Visits 20   Date for PT Re-Evaluation 12/29/15   Authorization Type Medicare G codes;  KX at visit 15   PT Start Time 1055   PT Stop Time 1146   PT Time Calculation (min) 51 min   Activity Tolerance Patient tolerated treatment well      Past Medical History  Diagnosis Date  . Hypertension   . Peripheral edema   . Hyperlipidemia   . CAD S/P percutaneous coronary angioplasty 1996, 2009, 01/2010    PTCA of L Cx 1996; BMS-RCA [facility]) 2009; Staged PCI RCA (70% ISR) --> 3.0 mm x 23 mm BMS, staged PCI Diag - 2.0 mm x 12 mm Mini Vision BMS (2.25 mm)  . Myocardial infarction [facility]) 1996, 2009    x 2;last time was about 8-9yr ago   . Osteoarthritis of both knees   . Chronic back pain     scoliosis--pt states unable to have surgery bc of age  . GERD (gastroesophageal reflux disease)     takes Nexium daily  . History of colon polyps   . [place]    seed implant  . Cataracts, bilateral     immature  . Anxiety   . Obesity (BMI 30.0-34.9)   . OSA (obstructive sleep apnea)   . Persistent atrial fibrillation ([facility]     Past Surgical History  Procedure Laterality Date  . Right knee arthrsoscopy  121yrago  . Cyst removed  in college  . Coronary angioplasty with stent placement  1996, 2009, August 2011    PTCA of L Cx 1996; [facility] Staged PCI RCA (70% ISR) --> 3.0 mm x 23 mm BMS, staged PCI Diag - 2.0 mm x 12 mm Mini Vision BMS (2.25 mm)  . Wisdom teeth extraccted    . Colonoscopy    . Seeds placed into prostate   2005  . Cholecystectomy    .  Neuroplasty / transposition median nerve at carpal tunnel bilateral    . Right trigger finger    . Total knee arthroplasty  05/09/2012    Procedure: TOTAL KNEE ARTHROPLASTY;  Surgeon: FrKerin SalenMD;  [facility] Service: Orthopedics;  Laterality: Right;  . Transthoracic echocardiogram  09/26/2012    EF 55-60%. Grade 2 diastolic dysfunction with moderate concentric LVH. Aortic sclerosis.  . Marland Kitchenm myoview ltd  November 2013    EF 53%; fixed mid inferior-inferolateral defect, infarct with no ischemia.  . Marland Kitchenm myoview ltd  11/24/2010    ejection fraction 58%,left ventricle is normal size ,no evidence of inducible myocardial ischemia  . Cardioversion N/A 06/12/2015    Procedure: CARDIOVERSION;  Surgeon: MiSanda KleinMD;  [facility];  Service: Cardiovascular;  Laterality: N/A;  . Tee without cardioversion N/A 06/12/2015    Procedure: TRANSESOPHAGEAL ECHOCARDIOGRAM (TEE);  Surgeon: MiSanda KleinMD;  [facility];  Service: Cardiovascular;  Laterality: N/A;  . Cardioversion N/A 08/02/2015    Procedure: CARDIOVERSION;  Surgeon: JaThompson GrayerMD;  [facility] Service: Cardiovascular;  Laterality: N/A;    There were no vitals filed  for this visit.      Subjective Assessment - 12/11/15 1058    Subjective My knee is "mediocre".   "The Nu-step sensitizes the knee."   Currently in Pain? Yes   Pain Score 3    Pain Location Knee   Pain Orientation Left   Pain Type Chronic pain   Pain Frequency Constant                         [facility] Adult PT Treatment/Exercise - 12/11/15 0001    Ambulation/Gait   Ambulation/Gait Yes   Ambulation/Gait Assistance 6: Modified independent (Device/Increase time)   Assistive device Straight cane   Pre-Gait Activities sit to stand from very high plinth    Lumbar Exercises: Standing   Scapular Retraction Limitations marching 10x each leg   Other Standing Lumbar Exercises weight shift 4 ways without UE support   Other  Standing Lumbar Exercises 3 way taps 10x right/left   Lumbar Exercises: Seated   Other Seated Lumbar Exercises seated green band rows and shoulder extensions 20x each   Knee/Hip Exercises: Aerobic   Nustep L1 x 10 min  decrease intensity secondary to knee pain   Knee/Hip Exercises: Standing   Other Standing Knee Exercises hip extension and abduction 5sec hold 5x each   Other Standing Knee Exercises UE slides with and without UE slides 10x each   Knee/Hip Exercises: Seated   Knee/Hip Flexion hand to knee opposite isometric 10x   Other Seated Knee/Hip Exercises foam roll push 15x   Marching Limitations                    PT Short Term Goals - 12/11/15 1145    PT SHORT TERM GOAL #1   Title The patient will have improved HS muscle length to 70 degrees needed for greater ease getting in and out of the car.  10/20/15   Time 4   Period Weeks   Status Partially Met   PT SHORT TERM GOAL #2   Title The patient will have improved trunk and LE strength be able to stand 3 min for grooming, dressing tasks   Status Achieved   PT SHORT TERM GOAL #3   Title The patient will be able to ambulate 500 feet with SPC and min to moderate back and knee pain needed for short distance community ambulation to church, [employer], symphony   Time 4   Period Weeks   Status On-going   PT SHORT TERM GOAL #4   Title The patient will have improved hip flexor muscle length to 10 degrees needed for walking longer distances with decreased energy expenditure   Status Achieved   PT SHORT TERM GOAL #5   Title Timed up and go test improved to 14 sec indicating improved gait speed and safety   Status Achieved           PT Long Term Goals - 12/11/15 1146    PT LONG TERM GOAL #1   Title The patient will be independent with safe self progression of HEP to improved walking distance and further conditioning   11/17/15   Time 8   Period Weeks   Status On-going   PT LONG TERM GOAL #2   Title The patient  will have improved core and hip strength to grossly 4+/5 holding trunk/head upright needed to stand for 5 min for kitchen work, combing hair  and shaving   Time 8   Period Weeks  Status On-going   PT LONG TERM GOAL #3   Title The patient will be able to ambulate 700 feet with SPC with min to moderate back and knee pain for ambulating longer community distances to the symphony, church and movie theatre.     Time 8   Period Weeks   Status On-going   PT LONG TERM GOAL #4   Title The patient will have improved Timed up and Go test to 12 sec indicating improved gait speed and safety    Time 8   Period Weeks   Status On-going   PT LONG TERM GOAL #5   Title Patient will have improved LE strength needed for 5x sit to stand test < 13 sec   Status Achieved   PT LONG TERM GOAL #6   Title BERG balance test of at least 48/56 indicating lower risk of falls    Time 8   Period Weeks   Status On-going   PT LONG TERM GOAL #7   Title Six minute walk test to 560 feet indicating improved gait safety, speed and endurance   Time 6   Period Weeks   Status On-going               Plan - 12/11/15 1131    Clinical Impression Statement The patient's left knee pain limits standing and walking tolerance.  With dynamic standing and walking O2 sats remain above 92%.  Able to walk 320 feet before sitting rest break (much improved from previous sessions).  Close supervision for safety, pain and to monitor respiration.     PT Next Visit Plan continue endurance and gait training; G code next visit      Patient will benefit from skilled therapeutic intervention in order to improve the following deficits and impairments:     Visit Diagnosis: Muscle weakness (generalized)  Other abnormalities of gait and mobility  Pain in left knee  Bilateral low back pain without sciatica     Problem List Patient Active Problem List   Diagnosis Date Noted  . Paroxysmal (Persistent) atrial fibrillation ([place])  10/01/2015  . Pneumonia   . Pneumonia due to organism   . Fever   . Acute respiratory failure ([place])   . Persistent atrial fibrillation ([place])   . Exertional dyspnea and fatigue 08/13/2013  . CAD S/P percutaneous coronary angioplasty   . Hyperlipidemia with target LDL less than 70   . Essential hypertension   . Obesity (BMI 30.0-34.9)   . Myocardial infarction; history of   . Osteoarthritis of right knee 05/11/2012    Ruben Im C 12/11/2015, 11:50 AM  [facility] [address] [place], [place], [address] Phone: [phone]   Fax:  [phone]  Name: Jerry [name] MRN: [id number] Date of Birth: 1930-04-06

## 2015-12-14 ENCOUNTER — Ambulatory Visit: Payer: Medicare Other

## 2015-12-14 DIAGNOSIS — M545 Low back pain, unspecified: Secondary | ICD-10-CM

## 2015-12-14 DIAGNOSIS — M25562 Pain in left knee: Secondary | ICD-10-CM | POA: Diagnosis not present

## 2015-12-14 DIAGNOSIS — M6281 Muscle weakness (generalized): Secondary | ICD-10-CM | POA: Diagnosis not present

## 2015-12-14 DIAGNOSIS — R2689 Other abnormalities of gait and mobility: Secondary | ICD-10-CM

## 2015-12-14 NOTE — Therapy (Signed)
[facility] [address] [place], [place], [address] Phone: [phone]   Fax:  [phone]  Physical Therapy Treatment  Patient Details  Name: Jerry [name] MRN: [id number] Date of Birth: [birth year] Referring Provider: Dr. Alroy Dust  Encounter Date: 12/14/2015      PT End of Session - 12/14/15 1129    Visit Number 20   Number of Visits 30   Date for PT Re-Evaluation 12/29/15   Authorization Type Medicare G codes;  KX at visit 15   PT Start Time 1059   PT Stop Time 1140   PT Time Calculation (min) 41 min   Activity Tolerance Patient tolerated treatment well   Behavior During Therapy [facility] for tasks assessed/performed      Past Medical History  Diagnosis Date  . Hypertension   . Peripheral edema   . Hyperlipidemia   . CAD S/P percutaneous coronary angioplasty 1996, 2009, 01/2010    PTCA of L Cx 1996; BMS-RCA [facility]) 2009; Staged PCI RCA (70% ISR) --> 3.0 mm x 23 mm BMS, staged PCI Diag - 2.0 mm x 12 mm Mini Vision BMS (2.25 mm)  . Myocardial infarction [facility]) 1996, 2009    x 2;last time was about 8-31yrs ago   . Osteoarthritis of both knees   . Chronic back pain     scoliosis--pt states unable to have surgery bc of age  . GERD (gastroesophageal reflux disease)     takes Nexium daily  . History of colon polyps   . Prostate cancer ([place]) 2005    seed implant  . Cataracts, bilateral     immature  . Anxiety   . Obesity (BMI 30.0-34.9)   . OSA (obstructive sleep apnea)   . Persistent atrial fibrillation [facility])     Past Surgical History  Procedure Laterality Date  . Right knee arthrsoscopy  67yrs ago  . Cyst removed  in college  . Coronary angioplasty with stent placement  1996, 2009, August 2011    PTCA of L Cx 1996; BMS-RCA [facility]) 2009; Staged PCI RCA (70% ISR) --> 3.0 mm x 23 mm BMS, staged PCI Diag - 2.0 mm x 12 mm Mini Vision BMS (2.25 mm)  . Wisdom teeth extraccted    . Colonoscopy    . Seeds  placed into prostate   2005  . Cholecystectomy    . Neuroplasty / transposition median nerve at carpal tunnel bilateral    . Right trigger finger    . Total knee arthroplasty  05/09/2012    Procedure: TOTAL KNEE ARTHROPLASTY;  Surgeon: Kerin Salen, MD;  Location: [place];  Service: Orthopedics;  Laterality: Right;  . Transthoracic echocardiogram  09/26/2012    EF 55-60%. Grade 2 diastolic dysfunction with moderate concentric LVH. Aortic sclerosis.  [name] Nm myoview ltd  November 2013    EF 53%; fixed mid inferior-inferolateral defect, infarct with no ischemia.  [name] Nm myoview ltd  11/24/2010    ejection fraction 58%,left ventricle is normal size ,no evidence of inducible myocardial ischemia  . Cardioversion N/A 06/12/2015    Procedure: CARDIOVERSION;  Surgeon: Sanda Klein, MD;  [facility];  Service: Cardiovascular;  Laterality: N/A;  . Tee without cardioversion N/A 06/12/2015    Procedure: TRANSESOPHAGEAL ECHOCARDIOGRAM (TEE);  Surgeon: Sanda Klein, MD;  Location: [facility] ENDOSCOPY;  Service: Cardiovascular;  Laterality: N/A;  . Cardioversion N/A 08/02/2015    Procedure: CARDIOVERSION;  Surgeon: [name], MD;  Location: [place];  Service: Cardiovascular;  Laterality:  N/A;    There were no vitals filed for this visit.      Subjective Assessment - 12/14/15 1101    Subjective I'm feeling negative today.     Patient Stated Goals be able to have more stamina;  participate in household chores (plan and cook meals);  get in /out of the car and walk into church, cinema, symphony   Currently in Pain? Yes   Pain Score 3    Pain Location Knee   Pain Orientation Left;Right   Pain Descriptors / Indicators Sore   Pain Type Chronic pain   Pain Onset More than a month ago   Pain Frequency Constant   Aggravating Factors  activity, living   Pain Relieving Factors resting                         [facility] Adult PT Treatment/Exercise - 12/14/15 0001    Lumbar Exercises: Aerobic    UBE (Upper Arm Bike) Level 0 x 6 (3/3)  monitored O2 during activity   Lumbar Exercises: Standing   Scapular Retraction Limitations marching 10x each leg   Other Standing Lumbar Exercises weight shift 4 ways without UE support   Lumbar Exercises: Seated   Long Arc Quad on Chair Both;2 sets;10 reps   Other Seated Lumbar Exercises seated green band rows and shoulder extensions 20x each   Knee/Hip Exercises: Standing   Hip Abduction Stengthening;Both;2 sets;15 reps  pain so ended standing activity early   Knee/Hip Exercises: Seated   Long Arc Quad Both;2 sets;10 reps   Marching Limitations 3 x 10    Abduction/Adduction  Strengthening;Both;20 reps                  PT Short Term Goals - 12/14/15 1111    PT SHORT TERM GOAL #2   Title The patient will have improved trunk and LE strength be able to stand 3 min for grooming, dressing tasks   Status Achieved   PT SHORT TERM GOAL #3   Title The patient will be able to ambulate 500 feet with SPC and min to moderate back and knee pain needed for short distance community ambulation to church, [employer], symphony   Time 4   Period Weeks   Status On-going           PT Long Term Goals - 12/14/15 1112    PT LONG TERM GOAL #1   Title The patient will be independent with safe self progression of HEP to improved walking distance and further conditioning   11/17/15   Time 8   Period Weeks   Status On-going   PT LONG TERM GOAL #3   Title The patient will be able to ambulate 700 feet with SPC with min to moderate back and knee pain for ambulating longer community distances to the symphony, church and [employer].     Time 8   Period Weeks   Status On-going               Plan - 12/14/15 1113    Clinical Impression Statement Pt with Lt knee pain that limits standing and walking tolerance. Standing is limited to 5 minutes at this time for home tasks. O2 sats stay 92-96% with standing acitivty and recovers quickly.  Pt  reports that his endurance/stamina has improved by 25-35% overall.  Pt with continued chronic endurance deficits and will continue to benefit from skilled PT to improve endurance and strength.  Rehab Potential Good   Clinical Impairments Affecting Rehab Potential A fib;  Right TKR; lumbar spinal stenosis   PT Frequency 2x / week   PT Duration 6 weeks   PT Treatment/Interventions ADLs/Self Care Home Management;Electrical Stimulation;Moist Heat;Therapeutic exercise;Ultrasound;Patient/family education;Manual techniques;Taping;Dry needling;Gait training;Functional mobility training;Neuromuscular re-education   PT Next Visit Plan continue endurance and gait training. Test TUG   Consulted and Agree with Plan of Care Patient      Patient will benefit from skilled therapeutic intervention in order to improve the following deficits and impairments:  Pain, Decreased balance, Decreased activity tolerance, Decreased strength, Decreased mobility, Difficulty walking  Visit Diagnosis: Muscle weakness (generalized)  Other abnormalities of gait and mobility  Pain in left knee  Bilateral low back pain without sciatica       G-Codes - 31-Dec-2015 1130    Functional Assessment Tool Used clinical judgement   Functional Limitation Mobility: Walking and moving around   Mobility: Walking and Moving Around Current Status [phone]) At least 60 percent but less than 80 percent impaired, limited or restricted   Mobility: Walking and Moving Around Goal Status [phone]) At least 40 percent but less than 60 percent impaired, limited or restricted      Problem List Patient Active Problem List   Diagnosis Date Noted  . Paroxysmal (Persistent) atrial fibrillation ([place]) 10/01/2015  . Pneumonia   . Pneumonia due to organism   . Fever   . Acute respiratory failure ([place])   . Persistent atrial fibrillation ([place])   . Exertional dyspnea and fatigue 08/13/2013  . CAD S/P percutaneous coronary angioplasty   .  Hyperlipidemia with target LDL less than 70   . Essential hypertension   . Obesity (BMI 30.0-34.9)   . Myocardial infarction; history of   . Osteoarthritis of right knee 05/11/2012     Sigurd Sos, PT 12-31-2015 11:31 AM  [facility] [address], [place], [place], [address] Phone: [phone]   Fax:  [phone]  Name: Jerry [name] MRN: [id number] Date of Birth: September 11, 1929

## 2015-12-18 ENCOUNTER — Ambulatory Visit: Payer: Medicare Other | Admitting: Physical Therapy

## 2015-12-18 DIAGNOSIS — R2689 Other abnormalities of gait and mobility: Secondary | ICD-10-CM

## 2015-12-18 DIAGNOSIS — M25562 Pain in left knee: Secondary | ICD-10-CM

## 2015-12-18 DIAGNOSIS — M6281 Muscle weakness (generalized): Secondary | ICD-10-CM | POA: Diagnosis not present

## 2015-12-18 DIAGNOSIS — M545 Low back pain: Secondary | ICD-10-CM | POA: Diagnosis not present

## 2015-12-18 NOTE — Therapy (Signed)
[facility] [address], [place], [place], [address] Phone: [phone]   Fax:  [phone]  Physical Therapy Treatment  Patient Details  Name: Jerry [name] MRN: [id number] Date of Birth: 09-07-1929 Referring Provider: Dr. Alroy Dust  Encounter Date: 12/18/2015      PT End of Session - 12/18/15 1121    Visit Number 21   Number of Visits 30   Date for PT Re-Evaluation 12/29/15   Authorization Type Medicare G codes;  KX at visit 15   PT Start Time 1056   PT Stop Time 1145   PT Time Calculation (min) 49 min   Activity Tolerance Patient tolerated treatment well      Past Medical History  Diagnosis Date  . Hypertension   . Peripheral edema   . Hyperlipidemia   . CAD S/P percutaneous coronary angioplasty 1996, 2009, 01/2010    PTCA of L Cx 1996; BMS-RCA [facility]) 2009; Staged PCI RCA (70% ISR) --> 3.0 mm x 23 mm BMS, staged PCI Diag - 2.0 mm x 12 mm Mini Vision BMS (2.25 mm)  . Myocardial infarction [facility]) 1996, 2009    x 2;last time was about 8-51yr ago   . Osteoarthritis of both knees   . Chronic back pain     scoliosis--pt states unable to have surgery bc of age  . GERD (gastroesophageal reflux disease)     takes Nexium daily  . History of colon polyps   . [place] 2005    seed implant  . Cataracts, bilateral     immature  . Anxiety   . Obesity (BMI 30.0-34.9)   . OSA (obstructive sleep apnea)   . Persistent atrial fibrillation ([facility]     Past Surgical History  Procedure Laterality Date  . Right knee arthrsoscopy  89yrago  . Cyst removed  in college  . Coronary angioplasty with stent placement  1996, 2009, August 2011    PTCA of L Cx 1996; [facility]; Staged PCI RCA (70% ISR) --> 3.0 mm x 23 mm BMS, staged PCI Diag - 2.0 mm x 12 mm Mini Vision BMS (2.25 mm)  . Wisdom teeth extraccted    . Colonoscopy    . Seeds placed into prostate   2005  . Cholecystectomy    .  Neuroplasty / transposition median nerve at carpal tunnel bilateral    . Right trigger finger    . Total knee arthroplasty  05/09/2012    Procedure: TOTAL KNEE ARTHROPLASTY;  Surgeon: FrKerin SalenMD;  [facility];  Laterality: Right;  . Transthoracic echocardiogram  09/26/2012    EF 55-60%. Grade 2 diastolic dysfunction with moderate concentric LVH. Aortic sclerosis.  . Marland Kitchenm myoview ltd  November 2013    EF 53%; fixed mid inferior-inferolateral defect, infarct with no ischemia.  . Marland Kitchenm myoview ltd  11/24/2010    ejection fraction 58%,left ventricle is normal size ,no evidence of inducible myocardial ischemia  . Cardioversion N/A 06/12/2015    Procedure: CARDIOVERSION;  Surgeon: MiSanda KleinMD;  [facility];  Service: Cardiovascular;  Laterality: N/A;  . Tee without cardioversion N/A 06/12/2015    Procedure: TRANSESOPHAGEAL ECHOCARDIOGRAM (TEE);  Surgeon: MiSanda KleinMD;  [facility];  Service: Cardiovascular;  Laterality: N/A;  . Cardioversion N/A 08/02/2015    Procedure: CARDIOVERSION;  Surgeon: JaThompson GrayerMD;  [facility];  Laterality: N/A;    There were no vitals filed  for this visit.      Subjective Assessment - 12/18/15 1100    Subjective I took 2 Tylenol today.    I"m back to 2/3 of the cooking, "that's a new thing."   I don't do the grocery shopping.     Currently in Pain? Yes   Pain Score 2    Pain Location Knee   Pain Orientation Left   Pain Type Chronic pain                         [facility] Adult PT Treatment/Exercise - 12/18/15 0001    Ambulation/Gait   Ambulation/Gait Yes   Ambulation/Gait Assistance 6: Modified independent (Device/Increase time)   Assistive device Straight cane   Pre-Gait Activities batting balloon 2 minutes  discontinued secondary to complaints of neck pain   Lumbar Exercises: Seated   Other Seated Lumbar Exercises seated 4# plyo ball core: hip  swings,golf swings, chops and [employer] 1 min each  lightened to 2# weight secondary to shoulder pain   Knee/Hip Exercises: Stretches   Active Hamstring Stretch Both;3 reps;30 seconds  on steps   Gastroc Stretch Right;Left;3 reps;30 seconds  on step   Knee/Hip Exercises: Standing   Other Standing Knee Exercises step taps 10x   Other Standing Knee Exercises perched on edge of tall table hip abduction with red band 10x each  also hip flexion with red band 10x each   Knee/Hip Exercises: Seated   Clamshell with TheraBand Red  15x   Sit to [employer] 10 reps                  PT Short Term Goals - 12/18/15 1159    PT SHORT TERM GOAL #1   Title The patient will have improved HS muscle length to 70 degrees needed for greater ease getting in and out of the car.  10/20/15   Time 4   Period Weeks   Status Partially Met   PT SHORT TERM GOAL #2   Title The patient will have improved trunk and LE strength be able to stand 3 min for grooming, dressing tasks   Status Achieved   PT SHORT TERM GOAL #3   Title The patient will be able to ambulate 500 feet with SPC and min to moderate back and knee pain needed for short distance community ambulation to church, [employer], symphony   Time 4   Period Weeks   Status On-going   PT SHORT TERM GOAL #4   Title The patient will have improved hip flexor muscle length to 10 degrees needed for walking longer distances with decreased energy expenditure   Status Achieved   PT SHORT TERM GOAL #5   Title Timed up and go test improved to 14 sec indicating improved gait speed and safety   Status Achieved           PT Long Term Goals - 12/18/15 1159    PT LONG TERM GOAL #1   Title The patient will be independent with safe self progression of HEP to improved walking distance and further conditioning   11/17/15   Time 8   Period Weeks   Status On-going   PT LONG TERM GOAL #2   Title The patient will have improved core and hip strength to grossly  4+/5 holding trunk/head upright needed to stand for 5 min for kitchen work, combing hair  and shaving   Time 8   Period Weeks  Status On-going   PT LONG TERM GOAL #3   Title The patient will be able to ambulate 700 feet with SPC with min to moderate back and knee pain for ambulating longer community distances to the symphony, church and movie theatre.     Time 8   Period Weeks   Status On-going   PT LONG TERM GOAL #4   Title The patient will have improved Timed up and Go test to 12 sec indicating improved gait speed and safety    Time 8   Period Weeks   Status On-going   PT LONG TERM GOAL #5   Title Patient will have improved LE strength needed for 5x sit to stand test < 13 sec   Status Achieved   PT LONG TERM GOAL #6   Title BERG balance test of at least 48/56 indicating lower risk of falls    Time 8   Period Weeks   Status On-going   PT LONG TERM GOAL #7   Title Six minute walk test to 560 feet indicating improved gait safety, speed and endurance   Time 6   Period Weeks   Status On-going               Plan - 12/18/15 1145    Clinical Impression Statement The patient needs numerous modifications today secondary to multi region joint pain (shoulder, neck and knee).   Functionally, he is starting to do some cooking but is unable to do the grocery shopping yet.  He continues to be limited in gait and standing tolerance time.  Discussed the importance of continuation of ex's upon discharge and he reports he has [employer] benefit.  Discussed community options for this including the [employer] and [employer].  Will continue to promote independence with HEP.     PT Next Visit Plan continue endurance and gait training. Test TUG;  Recert due in 1 1/2 weeks      Patient will benefit from skilled therapeutic intervention in order to improve the following deficits and impairments:     Visit Diagnosis: Muscle weakness (generalized)  Other abnormalities of gait and mobility  Pain  in left knee     Problem List Patient Active Problem List   Diagnosis Date Noted  . Paroxysmal (Persistent) atrial fibrillation ([place]) 10/01/2015  . Pneumonia   . Pneumonia due to organism   . Fever   . Acute respiratory failure ([place])   . Persistent atrial fibrillation ([place])   . Exertional dyspnea and fatigue 08/13/2013  . CAD S/P percutaneous coronary angioplasty   . Hyperlipidemia with target LDL less than 70   . Essential hypertension   . Obesity (BMI 30.0-34.9)   . Myocardial infarction; history of   . Osteoarthritis of right knee 05/11/2012   Ruben Im, PT 12/18/2015 12:05 PM Phone: [phone] Fax: [phone]  Alvera Singh 12/18/2015, 12:04 PM  [facility] [address] [place], [place], [address] Phone: [phone]   Fax:  [phone]  Name: Jerry [name] MRN: [id number] Date of Birth: 10/15/1929

## 2015-12-21 ENCOUNTER — Ambulatory Visit: Payer: Medicare Other | Admitting: Physical Therapy

## 2015-12-21 ENCOUNTER — Encounter: Payer: Self-pay | Admitting: Physical Therapy

## 2015-12-21 DIAGNOSIS — M6281 Muscle weakness (generalized): Secondary | ICD-10-CM

## 2015-12-21 DIAGNOSIS — M25562 Pain in left knee: Secondary | ICD-10-CM

## 2015-12-21 DIAGNOSIS — M545 Low back pain: Secondary | ICD-10-CM | POA: Diagnosis not present

## 2015-12-21 DIAGNOSIS — R2689 Other abnormalities of gait and mobility: Secondary | ICD-10-CM | POA: Diagnosis not present

## 2015-12-21 NOTE — Therapy (Signed)
[facility] [address] [place], [place], [address] Phone: [phone]   Fax:  [phone]  Physical Therapy Treatment  Patient Details  Name: Jerry [name] AGE MRN: [id number] Date of Birth: 1929-08-02 Referring Provider: Dr. Alroy Dust  Encounter Date: 12/21/2015      PT End of Session - 12/21/15 0940    Visit Number 22   Number of Visits 30   Date for PT Re-Evaluation 12/29/15   Authorization Type Medicare G codes;  KX at visit 15   PT Start Time 0930   PT Stop Time 1015   PT Time Calculation (min) 45 min   Activity Tolerance Patient tolerated treatment well   Behavior During Therapy [facility] for tasks assessed/performed      Past Medical History  Diagnosis Date  . Hypertension   . Peripheral edema   . Hyperlipidemia   . CAD S/P percutaneous coronary angioplasty 1996, 2009, 01/2010    PTCA of L Cx 1996; BMS-RCA [facility]) 2009; Staged PCI RCA (70% ISR) --> 3.0 mm x 23 mm BMS, staged PCI Diag - 2.0 mm x 12 mm Mini Vision BMS (2.25 mm)  . Myocardial infarction [facility]) 1996, 2009    x 2;last time was about 8-18yr ago   . Osteoarthritis of both knees   . Chronic back pain     scoliosis--pt states unable to have surgery bc of age  . GERD (gastroesophageal reflux disease)     takes Nexium daily  . History of colon polyps   . Prostate cancer ([place] 2005    seed implant  . Cataracts, bilateral     immature  . Anxiety   . Obesity (BMI 30.0-34.9)   . OSA (obstructive sleep apnea)   . Persistent atrial fibrillation ([facility]     Past Surgical History  Procedure Laterality Date  . Right knee arthrsoscopy  89yrago  . Cyst removed  in college  . Coronary angioplasty with stent placement  1996, 2009, August 2011    PTCA of L Cx 1996; [facility] Staged PCI RCA (70% ISR) --> 3.0 mm x 23 mm BMS, staged PCI Diag - 2.0 mm x 12 mm Mini Vision BMS (2.25 mm)  . Wisdom teeth extraccted    . Colonoscopy    . Seeds  placed into prostate   2005  . Cholecystectomy    . Neuroplasty / transposition median nerve at carpal tunnel bilateral    . Right trigger finger    . Total knee arthroplasty  05/09/2012    Procedure: TOTAL KNEE ARTHROPLASTY;  Surgeon: FrKerin SalenMD;  [facility] Service: Orthopedics;  Laterality: Right;  . Transthoracic echocardiogram  09/26/2012    EF 55-60%. Grade 2 diastolic dysfunction with moderate concentric LVH. Aortic sclerosis.  . Marland Kitchenm myoview ltd  November 2013    EF 53%; fixed mid inferior-inferolateral defect, infarct with no ischemia.  . Marland Kitchenm myoview ltd  11/24/2010    ejection fraction 58%,left ventricle is normal size ,no evidence of inducible myocardial ischemia  . Cardioversion N/A 06/12/2015    Procedure: CARDIOVERSION;  Surgeon: MiSanda KleinMD;  [facility];  Service: Cardiovascular;  Laterality: N/A;  . Tee without cardioversion N/A 06/12/2015    Procedure: TRANSESOPHAGEAL ECHOCARDIOGRAM (TEE);  Surgeon: MiSanda KleinMD;  [facility];  Service: Cardiovascular;  Laterality: N/A;  . Cardioversion N/A 08/02/2015    Procedure: CARDIOVERSION;  Surgeon: JaThompson GrayerMD;  [facility]: [facility] Service: Cardiovascular;  Laterality:  N/A;    There were no vitals filed for this visit.      Subjective Assessment - 12/21/15 0941    Subjective My back is bothering me this AM.    Currently in Pain? Yes   Pain Score 3    Pain Location Back   Pain Orientation Lower   Pain Descriptors / Indicators Aching   Aggravating Factors  Standing/walking   Pain Relieving Factors Sitting   Multiple Pain Sites No            [facility] PT Assessment - 12/21/15 0001    Timed Up and Go Test   TUG Normal TUG   Normal TUG (seconds) 9                     [facility] Adult PT Treatment/Exercise - 12/21/15 0001    Lumbar Exercises: Stretches   Active Hamstring Stretch 3 reps;20 seconds   Lumbar Exercises: Seated   Other Seated Lumbar Exercises Charlie  Browns and low choips with yellow plyo ball 2x 10 each   Knee/Hip Exercises: Stretches   Press photographer Right;Left;3 reps;30 seconds  on step   Knee/Hip Exercises: Aerobic   Nustep L1 x 10 min   Knee/Hip Exercises: Standing   Other Standing Knee Exercises batting the beachball back and forth 3x 10    Knee/Hip Exercises: Seated   Clamshell with TheraBand Red  15x2    Sit to Sand 10 reps;with UE support                  PT Short Term Goals - 12/18/15 1159    PT SHORT TERM GOAL #1   Title The patient will have improved HS muscle length to 70 degrees needed for greater ease getting in and out of the car.  10/20/15   Time 4   Period Weeks   Status Partially Met   PT SHORT TERM GOAL #2   Title The patient will have improved trunk and LE strength be able to stand 3 min for grooming, dressing tasks   Status Achieved   PT SHORT TERM GOAL #3   Title The patient will be able to ambulate 500 feet with SPC and min to moderate back and knee pain needed for short distance community ambulation to church, [employer], symphony   Time 4   Period Weeks   Status On-going   PT SHORT TERM GOAL #4   Title The patient will have improved hip flexor muscle length to 10 degrees needed for walking longer distances with decreased energy expenditure   Status Achieved   PT SHORT TERM GOAL #5   Title Timed up and go test improved to 14 sec indicating improved gait speed and safety   Status Achieved           PT Long Term Goals - 12/21/15 1016    PT LONG TERM GOAL #1   Title The patient will be independent with safe self progression of HEP to improved walking distance and further conditioning   11/17/15   Time 8   Period Weeks   Status On-going   PT LONG TERM GOAL #3   Title The patient will be able to ambulate 700 feet with SPC with min to moderate back and knee pain for ambulating longer community distances to the symphony, church and [employer].     Time 8   Period Weeks   Status  Achieved  Min-mod pain   PT LONG TERM GOAL #4  Title The patient will have improved Timed up and Go test to 12 sec indicating improved gait speed and safety    Time 8   Period Weeks   Status Achieved  9 sec               Plan - 12/21/15 0958    Clinical Impression Statement Good tolerance to treatment today. Did ewxcellent on TUG test with best time of 9 seconds.    Rehab Potential Good   Clinical Impairments Affecting Rehab Potential A fib;  Right TKR; lumbar spinal stenosis   PT Frequency 2x / week   PT Duration 6 weeks   PT Treatment/Interventions ADLs/Self Care Home Management;Electrical Stimulation;Moist Heat;Therapeutic exercise;Ultrasound;Patient/family education;Manual techniques;Taping;Dry needling;Gait training;Functional mobility training;Neuromuscular re-education   Consulted and Agree with Plan of Care Patient      Patient will benefit from skilled therapeutic intervention in order to improve the following deficits and impairments:  Pain, Decreased balance, Decreased activity tolerance, Decreased strength, Decreased mobility, Difficulty walking  Visit Diagnosis: Muscle weakness (generalized)  Other abnormalities of gait and mobility  Pain in left knee     Problem List Patient Active Problem List   Diagnosis Date Noted  . Paroxysmal (Persistent) atrial fibrillation ([place]) 10/01/2015  . Pneumonia   . Pneumonia due to organism   . Fever   . Acute respiratory failure ([place])   . Persistent atrial fibrillation ([place])   . Exertional dyspnea and fatigue 08/13/2013  . CAD S/P percutaneous coronary angioplasty   . Hyperlipidemia with target LDL less than 70   . Essential hypertension   . Obesity (BMI 30.0-34.9)   . Myocardial infarction; history of   . Osteoarthritis of right knee 05/11/2012    Axcel Horsch, PTA 12/21/2015, 10:45 AM  [facility] [address] [place], [place],  [address] Phone: [phone]   Fax:  [phone]  Name: Jerry [name] MRN: [id number] Date of Birth: 03-18-1930

## 2015-12-25 ENCOUNTER — Ambulatory Visit: Payer: Medicare Other | Admitting: Physical Therapy

## 2015-12-25 DIAGNOSIS — M6281 Muscle weakness (generalized): Secondary | ICD-10-CM | POA: Diagnosis not present

## 2015-12-25 DIAGNOSIS — M545 Low back pain, unspecified: Secondary | ICD-10-CM

## 2015-12-25 DIAGNOSIS — M25562 Pain in left knee: Secondary | ICD-10-CM | POA: Diagnosis not present

## 2015-12-25 DIAGNOSIS — R2689 Other abnormalities of gait and mobility: Secondary | ICD-10-CM

## 2015-12-25 NOTE — Therapy (Signed)
[facility] [address] [place], [place], [address] Phone: [phone]   Fax:  [phone]  Physical Therapy Treatment  Patient Details  Name: Jerry [name] MRN: [id number] Date of Birth: 1929/12/31 Referring Provider: Dr. Alroy Dust  Encounter Date: 12/25/2015      PT End of Session - 12/25/15 1152    Visit Number 23   Number of Visits 30   Date for PT Re-Evaluation 02/19/16   Authorization Type Medicare G codes;  KX at visit 15   PT Start Time 1013   PT Stop Time 1100   PT Time Calculation (min) 47 min   Activity Tolerance Patient tolerated treatment well      Past Medical History  Diagnosis Date  . Hypertension   . Peripheral edema   . Hyperlipidemia   . CAD S/P percutaneous coronary angioplasty 1996, 2009, 01/2010    PTCA of L Cx 1996; BMS-RCA [facility]) 2009; Staged PCI RCA (70% ISR) --> 3.0 mm x 23 mm BMS, staged PCI Diag - 2.0 mm x 12 mm Mini Vision BMS (2.25 mm)  . Myocardial infarction [facility]) 1996, 2009    x 2;last time was about 8-51yrs ago   . Osteoarthritis of both knees   . Chronic back pain     scoliosis--pt states unable to have surgery bc of age  . GERD (gastroesophageal reflux disease)     takes Nexium daily  . History of colon polyps   . Prostate cancer ([place]) 2005    seed implant  . Cataracts, bilateral     immature  . Anxiety   . Obesity (BMI 30.0-34.9)   . OSA (obstructive sleep apnea)   . Persistent atrial fibrillation [facility])     Past Surgical History  Procedure Laterality Date  . Right knee arthrsoscopy  44yrs ago  . Cyst removed  in college  . Coronary angioplasty with stent placement  1996, 2009, August 2011    PTCA of L Cx 1996; BMS-RCA [facility]) 2009; Staged PCI RCA (70% ISR) --> 3.0 mm x 23 mm BMS, staged PCI Diag - 2.0 mm x 12 mm Mini Vision BMS (2.25 mm)  . Wisdom teeth extraccted    . Colonoscopy    . Seeds placed into prostate   2005  . Cholecystectomy    .  Neuroplasty / transposition median nerve at carpal tunnel bilateral    . Right trigger finger    . Total knee arthroplasty  05/09/2012    Procedure: TOTAL KNEE ARTHROPLASTY;  Surgeon: Kerin Salen, MD;  Location: [place];  Service: Orthopedics;  Laterality: Right;  . Transthoracic echocardiogram  09/26/2012    EF 55-60%. Grade 2 diastolic dysfunction with moderate concentric LVH. Aortic sclerosis.  [name] Nm myoview ltd  November 2013    EF 53%; fixed mid inferior-inferolateral defect, infarct with no ischemia.  [name] Nm myoview ltd  11/24/2010    ejection fraction 58%,left ventricle is normal size ,no evidence of inducible myocardial ischemia  . Cardioversion N/A 06/12/2015    Procedure: CARDIOVERSION;  Surgeon: Sanda Klein, MD;  [facility];  Service: Cardiovascular;  Laterality: N/A;  . Tee without cardioversion N/A 06/12/2015    Procedure: TRANSESOPHAGEAL ECHOCARDIOGRAM (TEE);  Surgeon: Sanda Klein, MD;  Location: [facility] ENDOSCOPY;  Service: Cardiovascular;  Laterality: N/A;  . Cardioversion N/A 08/02/2015    Procedure: CARDIOVERSION;  Surgeon: [name], MD;  Location: [place];  Service: Cardiovascular;  Laterality: N/A;    There were no vitals filed  for this visit.      Subjective Assessment - 12/25/15 1018    Subjective Cooked a lot last week.  Chronic back and knee pain.  "I have spinal stenosis."    I went to eat at the [place] last night and did pretty good.  The last time I went there I could hardly walk in and out of the place.     Currently in Pain? Yes   Pain Score 3    Pain Location Back   Pain Type Chronic pain            [facility] PT Assessment - 12/25/15 0001    Strength   Overall Strength Comments LEs grossly 4/5, trunk strength 4/5   Flexibility   Hamstrings --  bilaterally 70 degrees   6 Minute Walk- Baseline   6 Minute Walk- Baseline yes  485 feet with SPC    02 Sat (%RA) 92 %   Berg Balance Test   Sit to Stand Able to stand without using hands  and stabilize independently   Standing Unsupported Able to stand safely 2 minutes   Sitting with Back Unsupported but Feet Supported on Floor or Stool Able to sit safely and securely 2 minutes   Stand to Sit Sits safely with minimal use of hands   Transfers Able to transfer safely, minor use of hands   Standing Unsupported with Eyes Closed Able to stand 10 seconds safely   Standing Ubsupported with Feet Together Able to place feet together independently and stand for 1 minute with supervision   From Standing, Reach Forward with Outstretched Arm Can reach forward >12 cm safely (5")   From Standing Position, Pick up Object from Floor Able to pick up shoe safely and easily   From Standing Position, Turn to Look Behind Over each Shoulder Looks behind one side only/other side shows less weight shift   Turn 360 Degrees Able to turn 360 degrees safely but slowly   Standing Unsupported, Alternately Place Feet on Step/Stool Able to stand independently and safely and complete 8 steps in 20 seconds   Standing Unsupported, One Foot in Front Able to plae foot ahead of the other independently and hold 30 seconds   Standing on One Leg Tries to lift leg/unable to hold 3 seconds but remains standing independently   Total Score 47                     [facility] Adult PT Treatment/Exercise - 12/25/15 0001    Knee/Hip Exercises: Stretches   Active Hamstring Stretch Right;Left;3 reps;30 seconds   Knee/Hip Exercises: Aerobic   Nustep L1 x 10 min      Seated red plyoball core series:  Hip to hip, chops, swings and Charlie Browns 1 min each    Encouraged him to call [employer] community programs to use after PT.        PT Short Term Goals - 12/25/15 1038    PT SHORT TERM GOAL #1   Title The patient will have improved HS muscle length to 70 degrees needed for greater ease getting in and out of the car.  10/20/15   Time 4   Period Weeks   Status On-going   PT SHORT TERM GOAL #2   Title  The patient will have improved trunk and LE strength be able to stand 3 min for grooming, dressing tasks   PT SHORT TERM GOAL #3   Title The patient will be able to  ambulate 500 feet with SPC and min to moderate back and knee pain needed for short distance community ambulation to church, Gaff[employer], symphony   Status Achieved   PT [place] #4   Title The patient will have improved hip flexor muscle length to 10 degrees needed for walking longer distances with decreased energy expenditure   Status Achieved   PT SHORT TERM GOAL #5   Title Timed up and go test improved to 14 sec indicating improved gait speed and safety   Status Achieved           PT Long Term Goals - 12/25/15 1050    PT LONG TERM GOAL #1   Title The patient will be independent with safe self progression of HEP to improved walking distance and further conditioning   11/17/15   Time 8   Period Weeks   Status On-going   PT LONG TERM GOAL #2   Title The patient will have improved core and hip strength to grossly 4+/5 holding trunk/head upright needed to stand for 5 min for kitchen work, combing hair  and shaving   Time 8   Period Weeks   Status On-going   PT LONG TERM GOAL #3   Title The patient will be able to ambulate 700 feet with SPC with min to moderate back and knee pain for ambulating longer community distances to the symphony, church and [employer].     Status Achieved   PT LONG TERM GOAL #4   Title The patient will have improved Timed up and Go test to 12 sec indicating improved gait speed and safety    Status Achieved   PT LONG TERM GOAL #5   Title Patient will have improved LE strength needed for 5x sit to stand test < 13 sec   Status Achieved   PT LONG TERM GOAL #6   Title BERG balance test of at least 48/56 indicating lower risk of falls    Time 8   Period Weeks   Status On-going   PT LONG TERM GOAL #7   Title Six minute walk test to 560 feet indicating improved gait safety, speed and  endurance   Time 6   Period Weeks   Status On-going               Plan - 12/25/15 1201    Clinical Impression Statement The patient reports functional improvements including the ability to stand for cooking and the ability to go out to a restuarant now.   He is still however limited in longer distance community ambulation to the grocery store.  He is limited by shortness of breath, back pain  and knee pain.  The patient has made steady progress with rehab goals but progress has been slowed by multi-cormidities.   Six minute walk test limited to 485 feet (well below norm).  BERG balance test 47/56 indicating moderate risk of falls.    Core and LE strength grossly 4/5.  Good improvement in Timed up and Go test to 9 secc.   He would benefit from further PT to address continued deficits and to prevent a decline in functional status.     Rehab Potential Good   PT Frequency 2x / week   PT Duration 6 weeks   PT Treatment/Interventions ADLs/Self Care Home Management;Electrical Stimulation;Moist Heat;Therapeutic exercise;Ultrasound;Patient/family education;Manual techniques;Taping;Dry needling;Gait training;Functional mobility training;Neuromuscular re-education   PT Next Visit Plan Increase ambulation distance;  improve standing tolerance time;  LE strengthening;  Encourage him to call for [place] for post discharge from PT      Patient will benefit from skilled therapeutic intervention in order to improve the following deficits and impairments:  Pain, Decreased balance, Decreased activity tolerance, Decreased strength, Decreased mobility, Difficulty walking  Visit Diagnosis: Muscle weakness (generalized) - Plan: PT plan of care cert/re-cert  Other abnormalities of gait and mobility - Plan: PT plan of care cert/re-cert  Pain in left knee - Plan: PT plan of care cert/re-cert  Bilateral low back pain without sciatica - Plan: PT plan of care cert/re-cert     Problem  List Patient Active Problem List   Diagnosis Date Noted  . Paroxysmal (Persistent) atrial fibrillation ([place]) 10/01/2015  . Pneumonia   . Pneumonia due to organism   . Fever   . Acute respiratory failure ([place])   . Persistent atrial fibrillation ([place])   . Exertional dyspnea and fatigue 08/13/2013  . CAD S/P percutaneous coronary angioplasty   . Hyperlipidemia with target LDL less than 70   . Essential hypertension   . Obesity (BMI 30.0-34.9)   . Myocardial infarction; history of   . Osteoarthritis of right knee 05/11/2012    Ruben Im, PT 12/25/2015 12:13 PM Phone: [phone] Fax: [phone]  Alvera Singh 12/25/2015, 12:11 PM  [facility] [address] [place], [place], [address] Phone: [phone]   Fax:  [phone]  Name: Jerry [name] MRN: [id number] Date of Birth: Sep 15, 1929

## 2015-12-29 ENCOUNTER — Ambulatory Visit: Payer: Medicare Other | Admitting: Physical Therapy

## 2015-12-29 ENCOUNTER — Encounter: Payer: Self-pay | Admitting: Physical Therapy

## 2015-12-29 DIAGNOSIS — M25562 Pain in left knee: Secondary | ICD-10-CM

## 2015-12-29 DIAGNOSIS — M545 Low back pain, unspecified: Secondary | ICD-10-CM

## 2015-12-29 DIAGNOSIS — R2689 Other abnormalities of gait and mobility: Secondary | ICD-10-CM

## 2015-12-29 DIAGNOSIS — M6281 Muscle weakness (generalized): Secondary | ICD-10-CM

## 2015-12-29 NOTE — Therapy (Signed)
[facility] [address] [place], [place], [address] Phone: [phone]   Fax:  [phone]  Physical Therapy Treatment  Patient Details  Name: Jerry [name] MRN: [id number] Date of Birth: [birth year] Referring Provider: Dr. Alroy Dust  Encounter Date: 12/29/2015      PT End of Session - 12/29/15 1140    Visit Number 24   Number of Visits 30   Date for PT Re-Evaluation 02/19/16   Authorization Type Medicare G codes;  KX at visit 15   PT Start Time 1100   PT Stop Time 1143   PT Time Calculation (min) 43 min   Activity Tolerance Patient tolerated treatment well   Behavior During Therapy [facility] for tasks assessed/performed      Past Medical History:  Diagnosis Date  . Anxiety   . CAD S/P percutaneous coronary angioplasty 1996, 2009, 01/2010   PTCA of L Cx 1996; BMS-RCA [facility]) 2009; Staged PCI RCA (70% ISR) --> 3.0 mm x 23 mm BMS, staged PCI Diag - 2.0 mm x 12 mm Mini Vision BMS (2.25 mm)  . Cataracts, bilateral    immature  . Chronic back pain    scoliosis--pt states unable to have surgery bc of age  . GERD (gastroesophageal reflux disease)    takes Nexium daily  . History of colon polyps   . Hyperlipidemia   . Hypertension   . Myocardial infarction [facility]) 1996, 2009   x 2;last time was about 8-66yrs ago   . Obesity (BMI 30.0-34.9)   . OSA (obstructive sleep apnea)   . Osteoarthritis of both knees   . Peripheral edema   . Persistent atrial fibrillation ([place])   . Prostate cancer ([place]) 2005   seed implant    Past Surgical History:  Procedure Laterality Date  . CARDIOVERSION N/A 06/12/2015   Procedure: CARDIOVERSION;  Surgeon: Sanda Klein, MD;  Location: [place] [facility];  Service: Cardiovascular;  Laterality: N/A;  . CARDIOVERSION N/A 08/02/2015   Procedure: CARDIOVERSION;  Surgeon: [name], MD;  Location: [place];  Service: Cardiovascular;  Laterality: N/A;  . CHOLECYSTECTOMY    . COLONOSCOPY    .  CORONARY ANGIOPLASTY WITH STENT PLACEMENT  1996, 2009, August 2011   PTCA of L Cx 1996; BMS-RCA [facility]) 2009; Staged PCI RCA (70% ISR) --> 3.0 mm x 23 mm BMS, staged PCI Diag - 2.0 mm x 12 mm Mini Vision BMS (2.25 mm)  . cyst removed  in college  . NEUROPLASTY / TRANSPOSITION MEDIAN NERVE AT CARPAL TUNNEL BILATERAL    . NM MYOVIEW LTD  November 2013   EF 53%; fixed mid inferior-inferolateral defect, infarct with no ischemia.  [name] NM MYOVIEW LTD  11/24/2010   ejection fraction 58%,left ventricle is normal size ,no evidence of inducible myocardial ischemia  . right knee arthrsoscopy  33yrs ago  . right trigger finger    . seeds placed into prostate   2005  . TEE WITHOUT CARDIOVERSION N/A 06/12/2015   Procedure: TRANSESOPHAGEAL ECHOCARDIOGRAM (TEE);  Surgeon: Sanda Klein, MD;  Location[facility];  Service: Cardiovascular;  Laterality: N/A;  . TOTAL KNEE ARTHROPLASTY  05/09/2012   Procedure: TOTAL KNEE ARTHROPLASTY;  Surgeon: Kerin Salen, MD;  Location: [place];  Service: Orthopedics;  Laterality: Right;  . TRANSTHORACIC ECHOCARDIOGRAM  09/26/2012   EF 55-60%. Grade 2 diastolic dysfunction with moderate concentric LVH. Aortic sclerosis.  [name] wisdom teeth extraccted      There were no vitals filed for this visit.  Subjective Assessment - 12/29/15 1109    Subjective Doing well   Pertinent History A fib;  no  cardiac implants; cardioversion;  hx of prostrate CA    Limitations Walking;House hold activities   How long can you stand comfortably? I sit to shave but I think I could do it, I could stand to make a sandwich   How long can you walk comfortably? 300 feet with shortness of breath and low back pain   Patient Stated Goals be able to have more stamina;  participate in household chores (plan and cook meals);  get in /out of the car and walk into church, cinema, symphony   Currently in Pain? No/denies                         [facility] Adult PT Treatment/Exercise -  12/29/15 0001      Ambulation/Gait   Ambulation/Gait Assistance 6: Modified independent (Device/Increase time)   Assistive device Straight cane   Gait Comments ambulate 4 times around the gym     Lumbar Exercises: Seated   Other Seated Lumbar Exercises Charlie Browns and low chops with red plyo ball 2x 10 each and swing     Knee/Hip Exercises: Stretches   Active Hamstring Stretch Right;Left;3 reps;30 seconds     Knee/Hip Exercises: Aerobic   Nustep L1 x 10 min     Knee/Hip Exercises: Standing   Other Standing Knee Exercises step taps 10x  SOB   Other Standing Knee Exercises walk over foam rolls 10 times changing feet to start with supervision     Knee/Hip Exercises: Seated   Clamshell with TheraBand Red  15x2    Sit to Sand 10 reps  first time lost balance and ploped back into the chair                PT Education - 12/29/15 1140    Education provided No          PT Short Term Goals - 12/25/15 1038      PT SHORT TERM GOAL #1   Title The patient will have improved HS muscle length to 70 degrees needed for greater ease getting in and out of the car.  10/20/15   Time 4   Period Weeks   Status On-going     PT SHORT TERM GOAL #2   Title The patient will have improved trunk and LE strength be able to stand 3 min for grooming, dressing tasks     PT SHORT TERM GOAL #3   Title The patient will be able to ambulate 500 feet with SPC and min to moderate back and knee pain needed for short distance community ambulation to church, [employer], symphony   Status Achieved     PT SHORT TERM GOAL #4   Title The patient will have improved hip flexor muscle length to 10 degrees needed for walking longer distances with decreased energy expenditure   Status Achieved     PT SHORT TERM GOAL #5   Title Timed up and go test improved to 14 sec indicating improved gait speed and safety   Status Achieved           PT Long Term Goals - 12/29/15 1142      PT LONG TERM GOAL  #1   Title The patient will be independent with safe self progression of HEP to improved walking distance and further conditioning   11/17/15   Time 8  Period Weeks   Status On-going     PT LONG TERM GOAL #2   Title The patient will have improved core and hip strength to grossly 4+/5 holding trunk/head upright needed to stand for 5 min for kitchen work, combing hair  and shaving   Time 8   Period Weeks   Status On-going     PT LONG TERM GOAL #3   Title The patient will be able to ambulate 700 feet with SPC with min to moderate back and knee pain for ambulating longer community distances to the symphony, church and [employer].     Time 8   Period Weeks   Status Achieved     PT LONG TERM GOAL #4   Title The patient will have improved Timed up and Go test to 12 sec indicating improved gait speed and safety    Time 8   Period Weeks   Status Achieved     PT LONG TERM GOAL #5   Title Patient will have improved LE strength needed for 5x sit to stand test < 13 sec   Baseline 11.4 sec   (performed on 10/27/15)   Time 8   Period Weeks   Status Achieved     PT LONG TERM GOAL #6   Title BERG balance test of at least 48/56 indicating lower risk of falls    Time 8   Period Weeks   Status On-going     PT LONG TERM GOAL #7   Title Six minute walk test to 560 feet indicating improved gait safety, speed and endurance   Time 6   Period Weeks   Status On-going               Plan - 12/29/15 1140    Clinical Impression Statement Patient is able to walk over foam rolls with good balance.  Patient needs to put hands on thighs to stand up.  Patient was short of breath with toe taps. Patient will benefit from therapy to increase strength and balance.    Rehab Potential Good   Clinical Impairments Affecting Rehab Potential A fib;  Right TKR; lumbar spinal stenosis   PT Frequency 2x / week   PT Duration 6 weeks   PT Treatment/Interventions ADLs/Self Care Home Management;Electrical  Stimulation;Moist Heat;Therapeutic exercise;Ultrasound;Patient/family education;Manual techniques;Taping;Dry needling;Gait training;Functional mobility training;Neuromuscular re-education   PT Next Visit Plan Increase ambulation distance;  improve standing tolerance time;  LE strengthening;  Encourage him to call for [place] for post discharge from PT   PT Home Exercise Plan progress as needed   Consulted and Agree with Plan of Care Patient      Patient will benefit from skilled therapeutic intervention in order to improve the following deficits and impairments:  Pain, Decreased balance, Decreased activity tolerance, Decreased strength, Decreased mobility, Difficulty walking  Visit Diagnosis: Muscle weakness (generalized)  Other abnormalities of gait and mobility  Pain in left knee  Bilateral low back pain without sciatica     Problem List Patient Active Problem List   Diagnosis Date Noted  . Paroxysmal (Persistent) atrial fibrillation ([place]) 10/01/2015  . Pneumonia   . Pneumonia due to organism   . Fever   . Acute respiratory failure ([place])   . Persistent atrial fibrillation ([place])   . Exertional dyspnea and fatigue 08/13/2013  . CAD S/P percutaneous coronary angioplasty   . Hyperlipidemia with target LDL less than 70   . Essential hypertension   . Obesity (BMI 30.0-34.9)   .  Myocardial infarction; history of   . Osteoarthritis of right knee 05/11/2012   Earlie Counts, PT 12/29/15 11:44 AM    [facility] [address] [place], [place], [address] Phone: [phone]   Fax:  [phone]  Name: Jerry [name] MRN: [id number] Date of Birth: 09-30-1929

## 2016-01-04 ENCOUNTER — Ambulatory Visit: Payer: Medicare Other | Admitting: Physical Therapy

## 2016-01-04 ENCOUNTER — Encounter: Payer: Self-pay | Admitting: Physical Therapy

## 2016-01-04 DIAGNOSIS — M545 Low back pain, unspecified: Secondary | ICD-10-CM

## 2016-01-04 DIAGNOSIS — R2689 Other abnormalities of gait and mobility: Secondary | ICD-10-CM

## 2016-01-04 DIAGNOSIS — M6281 Muscle weakness (generalized): Secondary | ICD-10-CM

## 2016-01-04 DIAGNOSIS — M25562 Pain in left knee: Secondary | ICD-10-CM | POA: Diagnosis not present

## 2016-01-04 NOTE — Therapy (Signed)
[facility] [address], [place], [place], [address] Phone: [phone]   Fax:  [phone]  Physical Therapy Treatment  Patient Details  Name: Jerry [name] MRN: [id number] Date of Birth: September 10, 1929 Referring Provider: Dr. Alroy Dust  Encounter Date: 01/04/2016      PT End of Session - 01/04/16 1226    Visit Number 25   Number of Visits 30   Date for PT Re-Evaluation 02/19/16   Authorization Type Medicare G codes;  KX at visit 15   PT Start Time 1148   PT Stop Time 1226   PT Time Calculation (min) 38 min   Activity Tolerance Patient tolerated treatment well   Behavior During Therapy [facility] for tasks assessed/performed      Past Medical History:  Diagnosis Date  . Anxiety   . CAD S/P percutaneous coronary angioplasty 1996, 2009, 01/2010   PTCA of L Cx 1996; BMS-RCA [facility]) 2009; Staged PCI RCA (70% ISR) --> 3.0 mm x 23 mm BMS, staged PCI Diag - 2.0 mm x 12 mm Mini Vision BMS (2.25 mm)  . Cataracts, bilateral    immature  . Chronic back pain    scoliosis--pt states unable to have surgery bc of age  . GERD (gastroesophageal reflux disease)    takes Nexium daily  . History of colon polyps   . Hyperlipidemia   . Hypertension   . Myocardial infarction [facility]) 1996, 2009   x 2;last time was about 8-77yrs ago   . Obesity (BMI 30.0-34.9)   . OSA (obstructive sleep apnea)   . Osteoarthritis of both knees   . Peripheral edema   . Persistent atrial fibrillation ([place])   . Prostate cancer ([place]) 2005   seed implant    Past Surgical History:  Procedure Laterality Date  . CARDIOVERSION N/A 06/12/2015   Procedure: CARDIOVERSION;  Surgeon: Sanda Klein, MD;  Location: [place] [facility];  Service: Cardiovascular;  Laterality: N/A;  . CARDIOVERSION N/A 08/02/2015   Procedure: CARDIOVERSION;  Surgeon: [name], MD;  Location: [place];  Service: Cardiovascular;  Laterality: N/A;  . CHOLECYSTECTOMY    . COLONOSCOPY    .  CORONARY ANGIOPLASTY WITH STENT PLACEMENT  1996, 2009, August 2011   PTCA of L Cx 1996; BMS-RCA [facility]) 2009; Staged PCI RCA (70% ISR) --> 3.0 mm x 23 mm BMS, staged PCI Diag - 2.0 mm x 12 mm Mini Vision BMS (2.25 mm)  . cyst removed  in college  . NEUROPLASTY / TRANSPOSITION MEDIAN NERVE AT CARPAL TUNNEL BILATERAL    . NM MYOVIEW LTD  November 2013   EF 53%; fixed mid inferior-inferolateral defect, infarct with no ischemia.  [name] NM MYOVIEW LTD  11/24/2010   ejection fraction 58%,left ventricle is normal size ,no evidence of inducible myocardial ischemia  . right knee arthrsoscopy  17yrs ago  . right trigger finger    . seeds placed into prostate   2005  . TEE WITHOUT CARDIOVERSION N/A 06/12/2015   Procedure: TRANSESOPHAGEAL ECHOCARDIOGRAM (TEE);  Surgeon: Sanda Klein, MD;  Location: [facility] ENDOSCOPY;  Service: Cardiovascular;  Laterality: N/A;  . TOTAL KNEE ARTHROPLASTY  05/09/2012   Procedure: TOTAL KNEE ARTHROPLASTY;  Surgeon: Kerin Salen, MD;  Location: [place];  Service: Orthopedics;  Laterality: Right;  . TRANSTHORACIC ECHOCARDIOGRAM  09/26/2012   EF 55-60%. Grade 2 diastolic dysfunction with moderate concentric LVH. Aortic sclerosis.  [name] wisdom teeth extraccted      There were no vitals filed for this visit.  Subjective Assessment - 01/04/16 1155    Subjective I have slowed down doing stuff.    Pertinent History A fib;  no  cardiac implants; cardioversion;  hx of prostrate CA    Limitations Walking;House hold activities   How long can you stand comfortably? sit and stand to shave.    How long can you walk comfortably? 300 feet with shortness of breath and low back pain   Patient Stated Goals be able to have more stamina;  participate in household chores (plan and cook meals);  get in /out of the car and walk into church, cinema, symphony   Currently in Pain? No/denies                         [facility] Adult PT Treatment/Exercise - 01/04/16 0001       Ambulation/Gait   Ambulation/Gait Assistance 6: Modified independent (Device/Increase time)   Assistive device Straight cane   Gait Comments walked 2.5 min with SOB, O2 level 92%     Lumbar Exercises: Seated   Other Seated Lumbar Exercises Charlie Browns and low chops with yellow plyo ball 2x 10 each and swing     Knee/Hip Exercises: Aerobic   Nustep L1 x 10 min seat #13, arms #11     Knee/Hip Exercises: Standing   Other Standing Knee Exercises walk over foam rolls 10 times changing feet to start with supervision     Knee/Hip Exercises: Seated   Clamshell with TheraBand Green  15x2    Sit to Sand 10 reps  better control, no difficulty with slight difficulty startin                PT Education - 01/04/16 1226    Education provided No          PT Short Term Goals - 01/04/16 1154      PT SHORT TERM GOAL #1   Title The patient will have improved HS muscle length to 70 degrees needed for greater ease getting in and out of the car.  10/20/15   Time 4   Period Weeks   Status Achieved     PT SHORT TERM GOAL #2   Title The patient will have improved trunk and LE strength be able to stand 3 min for grooming, dressing tasks   Period Weeks   Status Achieved     PT SHORT TERM GOAL #3   Title The patient will be able to ambulate 500 feet with SPC and min to moderate back and knee pain needed for short distance community ambulation to church, [employer]aff[employer], symphony   Time 4   Period Weeks   Status Achieved     PT SHORT TERM GOAL #4   Title The patient will have improved hip flexor muscle length to 10 degrees needed for walking longer distances with decreased energy expenditure   Time 4   Period Weeks   Status Achieved     PT SHORT TERM GOAL #5   Title Timed up and go test improved to 14 sec indicating improved gait speed and safety   Time 4   Period Weeks   Status Achieved           PT Long Term Goals - 01/04/16 1216      PT LONG TERM GOAL #7   Title Six  minute walk test to 560 feet indicating improved gait safety, speed and endurance   Time 6   Period Weeks  Status On-going  walk 2.5 min               Plan - 01/04/16 1226    Clinical Impression Statement Patient came into therapy being fatiqued but he was able to exercise without resting.  Patient can walk 2.5 min before he has to sit to rest and O2 level was 92%.  Patient able to do the green theraband with exercise instead of red and use yellow ball instead of red due to increased strength.  Patient is now cooking every other week but unable to food shop yet. Patient will benefit from physical therapy to improve balance, strength and endurance.    Rehab Potential Good   Clinical Impairments Affecting Rehab Potential A fib;  Right TKR; lumbar spinal stenosis   PT Frequency 2x / week   PT Duration 6 weeks   PT Treatment/Interventions ADLs/Self Care Home Management;Electrical Stimulation;Moist Heat;Therapeutic exercise;Ultrasound;Patient/family education;Manual techniques;Taping;Dry needling;Gait training;Functional mobility training;Neuromuscular re-education   PT Next Visit Plan Increase ambulation distance;  improve standing tolerance time;  LE strengthening;  Encourage him to call for [place] for post discharge from PT   PT Home Exercise Plan progress as needed   Consulted and Agree with Plan of Care Patient      Patient will benefit from skilled therapeutic intervention in order to improve the following deficits and impairments:  Pain, Decreased balance, Decreased activity tolerance, Decreased strength, Decreased mobility, Difficulty walking  Visit Diagnosis: Muscle weakness (generalized)  Other abnormalities of gait and mobility  Bilateral low back pain without sciatica     Problem List Patient Active Problem List   Diagnosis Date Noted  . Paroxysmal (Persistent) atrial fibrillation ([place]) 10/01/2015  . Pneumonia   . Pneumonia due to organism    . Fever   . Acute respiratory failure ([place])   . Persistent atrial fibrillation ([place])   . Exertional dyspnea and fatigue 08/13/2013  . CAD S/P percutaneous coronary angioplasty   . Hyperlipidemia with target LDL less than 70   . Essential hypertension   . Obesity (BMI 30.0-34.9)   . Myocardial infarction; history of   . Osteoarthritis of right knee 05/11/2012    Earlie Counts, PT 01/04/16 12:30 PM   [facility] [address] [place], [place], [address] Phone: [phone]   Fax:  [phone]  Name: ELDRID VARNES MRN: [id number] Date of Birth: 1929/07/29

## 2016-01-05 ENCOUNTER — Other Ambulatory Visit (HOSPITAL_COMMUNITY): Payer: Self-pay | Admitting: Nurse Practitioner

## 2016-01-05 ENCOUNTER — Other Ambulatory Visit: Payer: Self-pay | Admitting: Cardiology

## 2016-01-11 ENCOUNTER — Ambulatory Visit: Payer: Medicare Other | Attending: Family Medicine | Admitting: Physical Therapy

## 2016-01-11 ENCOUNTER — Encounter: Payer: Self-pay | Admitting: Physical Therapy

## 2016-01-11 DIAGNOSIS — R2689 Other abnormalities of gait and mobility: Secondary | ICD-10-CM | POA: Diagnosis not present

## 2016-01-11 DIAGNOSIS — M6281 Muscle weakness (generalized): Secondary | ICD-10-CM | POA: Insufficient documentation

## 2016-01-11 DIAGNOSIS — M545 Low back pain, unspecified: Secondary | ICD-10-CM

## 2016-01-11 NOTE — Therapy (Signed)
[facility] [address] [place], [place], [address] Phone: [phone]   Fax:  [phone]  Physical Therapy Treatment  Patient Details  Name: Jerry [name] MRN: [id number] Date of Birth: [birth year] Referring Provider: Dr. Alroy Dust  Encounter Date: 01/11/2016      PT End of Session - 01/11/16 1142    Visit Number 26   Number of Visits 30   Date for PT Re-Evaluation 02/19/16   Authorization Type Medicare G codes;  KX at visit 15   PT Start Time 1100   PT Stop Time 1142   PT Time Calculation (min) 42 min   Activity Tolerance Patient tolerated treatment well   Behavior During Therapy [facility] for tasks assessed/performed      Past Medical History:  Diagnosis Date  . Anxiety   . CAD S/P percutaneous coronary angioplasty 1996, 2009, 01/2010   PTCA of L Cx 1996; BMS-RCA [facility]) 2009; Staged PCI RCA (70% ISR) --> 3.0 mm x 23 mm BMS, staged PCI Diag - 2.0 mm x 12 mm Mini Vision BMS (2.25 mm)  . Cataracts, bilateral    immature  . Chronic back pain    scoliosis--pt states unable to have surgery bc of age  . GERD (gastroesophageal reflux disease)    takes Nexium daily  . History of colon polyps   . Hyperlipidemia   . Hypertension   . Myocardial infarction [facility]) 1996, 2009   x 2;last time was about 8-46yrs ago   . Obesity (BMI 30.0-34.9)   . OSA (obstructive sleep apnea)   . Osteoarthritis of both knees   . Peripheral edema   . Persistent atrial fibrillation ([place])   . Prostate cancer ([place]) 2005   seed implant    Past Surgical History:  Procedure Laterality Date  . CARDIOVERSION N/A 06/12/2015   Procedure: CARDIOVERSION;  Surgeon: Sanda Klein, MD;  Location: [place] [facility];  Service: Cardiovascular;  Laterality: N/A;  . CARDIOVERSION N/A 08/02/2015   Procedure: CARDIOVERSION;  Surgeon: [name], MD;  Location: [place];  Service: Cardiovascular;  Laterality: N/A;  . CHOLECYSTECTOMY    . COLONOSCOPY    .  CORONARY ANGIOPLASTY WITH STENT PLACEMENT  1996, 2009, August 2011   PTCA of L Cx 1996; BMS-RCA [facility]) 2009; Staged PCI RCA (70% ISR) --> 3.0 mm x 23 mm BMS, staged PCI Diag - 2.0 mm x 12 mm Mini Vision BMS (2.25 mm)  . cyst removed  in college  . NEUROPLASTY / TRANSPOSITION MEDIAN NERVE AT CARPAL TUNNEL BILATERAL    . NM MYOVIEW LTD  November 2013   EF 53%; fixed mid inferior-inferolateral defect, infarct with no ischemia.  [name] NM MYOVIEW LTD  11/24/2010   ejection fraction 58%,left ventricle is normal size ,no evidence of inducible myocardial ischemia  . right knee arthrsoscopy  27yrs ago  . right trigger finger    . seeds placed into prostate   2005  . TEE WITHOUT CARDIOVERSION N/A 06/12/2015   Procedure: TRANSESOPHAGEAL ECHOCARDIOGRAM (TEE);  Surgeon: Sanda Klein, MD;  Location: [facility];  Service: Cardiovascular;  Laterality: N/A;  . TOTAL KNEE ARTHROPLASTY  05/09/2012   Procedure: TOTAL KNEE ARTHROPLASTY;  Surgeon: Kerin Salen, MD;  Location: [place];  Service: Orthopedics;  Laterality: Right;  . TRANSTHORACIC ECHOCARDIOGRAM  09/26/2012   EF 55-60%. Grade 2 diastolic dysfunction with moderate concentric LVH. Aortic sclerosis.  [name] wisdom teeth extraccted      There were no vitals filed for this visit.  Subjective Assessment - 01/11/16 1106    Subjective I am tired today.    Pertinent History A fib;  no  cardiac implants; cardioversion;  hx of prostrate CA    Limitations Walking;House hold activities   How long can you stand comfortably? sit and stand to shave.    How long can you walk comfortably? 300 feet with shortness of breath and low back pain   Patient Stated Goals be able to have more stamina;  participate in household chores (plan and cook meals);  get in /out of the car and walk into church, cinema, symphony   Currently in Pain? No/denies                         [facility] Adult PT Treatment/Exercise - 01/11/16 0001      Ambulation/Gait    Ambulation/Gait Assistance 6: Modified independent (Device/Increase time)   Assistive device Straight cane   Gait Comments walked 480 feet with small SOB     Knee/Hip Exercises: Aerobic   Nustep L1 x 10 min seat #13, arms #11     Knee/Hip Exercises: Standing   Other Standing Knee Exercises side stepping wiht SPC and supervision   Other Standing Knee Exercises walk over 3 foam rolls 6 times then added step up onto foam pad 4 times     Knee/Hip Exercises: Seated   Clamshell with [employer] [place]  15x2    Sit to Sand 10 reps  better control, no difficulty with slight difficulty startin                PT Education - 01/11/16 1133    Education provided Yes   Education Details information on silver sneaker programs   Person(s) Educated Patient   Methods Explanation;Handout   Comprehension Verbalized understanding          PT Short Term Goals - 01/04/16 1154      PT SHORT TERM GOAL #1   Title The patient will have improved HS muscle length to 70 degrees needed for greater ease getting in and out of the car.  10/20/15   Time 4   Period Weeks   Status Achieved     PT SHORT TERM GOAL #2   Title The patient will have improved trunk and LE strength be able to stand 3 min for grooming, dressing tasks   Period Weeks   Status Achieved     PT SHORT TERM GOAL #3   Title The patient will be able to ambulate 500 feet with SPC and min to moderate back and knee pain needed for short distance community ambulation to church, Gaffer, symphony   Time 4   Period Weeks   Status Achieved     PT SHORT TERM GOAL #4   Title The patient will have improved hip flexor muscle length to 10 degrees needed for walking longer distances with decreased energy expenditure   Time 4   Period Weeks   Status Achieved     PT SHORT TERM GOAL #5   Title Timed up and go test improved to 14 sec indicating improved gait speed and safety   Time 4   Period Weeks   Status Achieved            PT Long Term Goals - 01/04/16 1216      PT LONG TERM GOAL #7   Title Six minute walk test to 560 feet indicating improved gait safety, speed and endurance  Time 6   Period Weeks   Status On-going  walk 2.5 min               Plan - 01/11/16 1142    Clinical Impression Statement [place] was able to exercise without SOB today.  Patient did not take breaks during exercise. Patient walked further then other days.  Patient will benefit from physical therapy to increase strength and balance.    Rehab Potential Good   Clinical Impairments Affecting Rehab Potential A fib;  Right TKR; lumbar spinal stenosis   PT Frequency 2x / week   PT Duration 6 weeks   PT Treatment/Interventions ADLs/Self Care Home Management;Electrical Stimulation;Moist Heat;Therapeutic exercise;Ultrasound;Patient/family education;Manual techniques;Taping;Dry needling;Gait training;Functional mobility training;Neuromuscular re-education   PT Next Visit Plan Increase ambulation distance;  improve standing tolerance time;  LE strengthening;  Encourage him to call for [place] for post discharge from PT; Discharge    PT Home Exercise Plan progress as needed   Consulted and Agree with Plan of Care Patient      Patient will benefit from skilled therapeutic intervention in order to improve the following deficits and impairments:  Pain, Decreased balance, Decreased activity tolerance, Decreased strength, Decreased mobility, Difficulty walking  Visit Diagnosis: Muscle weakness (generalized)  Other abnormalities of gait and mobility  Bilateral low back pain without sciatica     Problem List Patient Active Problem List   Diagnosis Date Noted  . Paroxysmal (Persistent) atrial fibrillation ([place]) 10/01/2015  . Pneumonia   . Pneumonia due to organism   . Fever   . Acute respiratory failure ([place])   . Persistent atrial fibrillation ([place])   . Exertional dyspnea and fatigue 08/13/2013  . CAD S/P  percutaneous coronary angioplasty   . Hyperlipidemia with target LDL less than 70   . Essential hypertension   . Obesity (BMI 30.0-34.9)   . Myocardial infarction; history of   . Osteoarthritis of right knee 05/11/2012    Earlie Counts, PT 01/11/16 11:46 AM    [facility] [address], [place], [place], [address] Phone: [phone]   Fax:  [phone]  Name: Jerry [name] MRN: [id number] Date of Birth: 1929/10/19

## 2016-01-11 NOTE — Patient Instructions (Addendum)
Silver Sneakers programs are located at  [place], [place], [place] [address] [phone]  [place], [place] [address] [phone]???  [place]  [place], [place], [place] [address] [phone]  [facility] [address], [place], [place] [address] [phone]  [place], [place], [place] [address] [phone]   [facility] [address], [place], [place] [address] Phone # [phone] Fax [phone]

## 2016-01-15 ENCOUNTER — Ambulatory Visit: Payer: Medicare Other | Admitting: Physical Therapy

## 2016-01-15 ENCOUNTER — Encounter: Payer: Self-pay | Admitting: Physical Therapy

## 2016-01-15 DIAGNOSIS — M545 Low back pain: Secondary | ICD-10-CM | POA: Diagnosis not present

## 2016-01-15 DIAGNOSIS — M6281 Muscle weakness (generalized): Secondary | ICD-10-CM

## 2016-01-15 DIAGNOSIS — R2689 Other abnormalities of gait and mobility: Secondary | ICD-10-CM | POA: Diagnosis not present

## 2016-01-15 NOTE — Therapy (Signed)
[facility] [address] [place], [place], [address] Phone: [phone]   Fax:  [phone]  Physical Therapy Treatment  Patient Details  Name: Jerry [name] MRN: [id number] Date of Birth: 06-19-1929 Referring Provider: Dr. Alroy Dust  Encounter Date: 01/15/2016      PT End of Session - 01/15/16 1117    Visit Number 27   Number of Visits 30   Date for PT Re-Evaluation 02/19/16   Authorization Type Medicare G codes;  KX at visit 15   PT Start Time 1100   PT Stop Time 1142   PT Time Calculation (min) 42 min   Activity Tolerance Patient tolerated treatment well   Behavior During Therapy [facility] for tasks assessed/performed      Past Medical History:  Diagnosis Date  . Anxiety   . CAD S/P percutaneous coronary angioplasty 1996, 2009, 01/2010   PTCA of L Cx 1996; BMS-RCA [facility]) 2009; Staged PCI RCA (70% ISR) --> 3.0 mm x 23 mm BMS, staged PCI Diag - 2.0 mm x 12 mm Mini Vision BMS (2.25 mm)  . Cataracts, bilateral    immature  . Chronic back pain    scoliosis--pt states unable to have surgery bc of age  . GERD (gastroesophageal reflux disease)    takes Nexium daily  . History of colon polyps   . Hyperlipidemia   . Hypertension   . Myocardial infarction [facility]) 1996, 2009   x 2;last time was about 8-52yr ago   . Obesity (BMI 30.0-34.9)   . OSA (obstructive sleep apnea)   . Osteoarthritis of both knees   . Peripheral edema   . [place]   . [place]   seed implant    Past Surgical History:  Procedure Laterality Date  . CARDIOVERSION N/A 06/12/2015   Procedure: CARDIOVERSION;  Surgeon: MSanda Klein MD;  [facility];  Service: Cardiovascular;  Laterality: N/A;  . CARDIOVERSION N/A 08/02/2015   Procedure: CARDIOVERSION;  Surgeon: JThompson Grayer MD;  [place] [facility]  Service: Cardiovascular;  Laterality: N/A;  . CHOLECYSTECTOMY    . COLONOSCOPY    .  CORONARY ANGIOPLASTY WITH STENT PLACEMENT  1996, 2009, August 2011   PTCA of L Cx 1996; BMS-RCA ([facility] 2009; Staged PCI RCA (70% ISR) --> 3.0 mm x 23 mm BMS, staged PCI Diag - 2.0 mm x 12 mm Mini Vision BMS (2.25 mm)  . cyst removed  in college  . NEUROPLASTY / TRANSPOSITION MEDIAN NERVE AT CARPAL TUNNEL BILATERAL    . NM MYOVIEW LTD  November 2013   EF 53%; fixed mid inferior-inferolateral defect, infarct with no ischemia.  .Marland KitchenNM MYOVIEW LTD  11/24/2010   ejection fraction 58%,left ventricle is normal size ,no evidence of inducible myocardial ischemia  . right knee arthrsoscopy  [place]  . right trigger finger    . seeds placed into prostate   2005  . TEE WITHOUT CARDIOVERSION N/A 06/12/2015   Procedure: TRANSESOPHAGEAL ECHOCARDIOGRAM (TEE);  Surgeon: MiSanda KleinMD;  [facility];  Service: Cardiovascular;  Laterality: N/A;  . TOTAL KNEE ARTHROPLASTY  05/09/2012   Procedure: TOTAL KNEE ARTHROPLASTY;  Surgeon: FrKerin SalenMD;  [facility] Service: Orthopedics;  Laterality: Right;  . TRANSTHORACIC ECHOCARDIOGRAM  09/26/2012   EF 55-60%. Grade 2 diastolic dysfunction with moderate concentric LVH. Aortic sclerosis.  . Marland Kitchenisdom teeth extraccted      There were no vitals filed for this visit.  Subjective Assessment - 01/15/16 1113    Subjective I have information on Silver Sneakers. I will chose which one I want.    Pertinent History A fib;  no  cardiac implants; cardioversion;  hx of prostrate CA    Limitations Walking;House hold activities   How long can you stand comfortably? sit and stand to shave.    How long can you walk comfortably? 300 feet with shortness of breath and low back pain   Currently in Pain? No/denies            [facility] PT Assessment - 01/15/16 0001      Assessment   Medical Diagnosis osteoarthritis; decreased balance and endurance   Hand Dominance Right   Next MD Visit 09/25/15   Prior Therapy HHPT     Precautions   Precautions None      Restrictions   Weight Bearing Restrictions No     Balance Screen   Has the patient fallen in the past 6 months No   Has the patient had a decrease in activity level because of a fear of falling?  No   Is the patient reluctant to leave their home because of a fear of falling?  No     Home Ecologist residence   Living Arrangements Spouse/significant other   Available Help at Discharge Family   Type of [place] to enter   Entrance Stairs-Number of Steps 2   Entrance Stairs-Rails None   Home Layout Two level;Bed/bath upstairs   Alternate Level Stairs-Number of Steps 13   Alternate Level Stairs-Rails Right   [place] - single point     Prior Function   Level of Independence Independent with basic ADLs   Vocation Retired     Associate Professor   Overall Cognitive Status Within Functional Limits for tasks assessed     AROM   Overall AROM Comments hips, knees, ankles WFLs except lacks full knee extension in sitting secondary to decreased HS length     Strength   Overall Strength Comments LEs grossly 4+/5, trunk strength 4/5     Berg Balance Test   Sit to Stand Able to stand without using hands and stabilize independently   Standing Unsupported Able to stand safely 2 minutes   Sitting with Back Unsupported but Feet Supported on Floor or Stool Able to sit safely and securely 2 minutes   Stand to Sit Sits safely with minimal use of hands   Transfers Able to transfer safely, minor use of hands   Standing Unsupported with Eyes Closed Able to stand 10 seconds safely   Standing Ubsupported with Feet Together Able to place feet together independently and stand 1 minute safely   From Standing, Reach Forward with Outstretched Arm Can reach forward >12 cm safely (5")   From Standing Position, Pick up Object from Floor Able to pick up shoe safely and easily   From Standing Position, Turn to Look Behind Over each Shoulder Looks  behind from both sides and weight shifts well   Turn 360 Degrees Able to turn 360 degrees safely in 4 seconds or less   Standing Unsupported, Alternately Place Feet on Step/Stool Able to stand independently and safely and complete 8 steps in 20 seconds   Standing Unsupported, One Foot in Front Able to plae foot ahead of the other independently and hold 30 seconds   Standing on One Leg Tries to lift leg/unable to hold 3 seconds  but remains standing independently   Total Score 51                     [facility] Adult PT Treatment/Exercise - 01/15/16 0001      Exercises   Exercises Other Exercises   Other Exercises  Discussed with patient on exercises to do at the gym and how to find a program that will fit him for 15 minutes     Knee/Hip Exercises: Aerobic   Nustep L1 x 10 min seat #13, arms #11     Knee/Hip Exercises: Seated   Clamshell with Marga Hoots  15x2                 PT Education - 01/15/16 1136    Education provided Yes   Education Details information on making a decision on a gym   Person(s) Educated Patient   Methods Explanation;Handout   Comprehension Verbalized understanding;Returned demonstration          PT Short Term Goals - 01/04/16 1154      PT SHORT TERM GOAL #1   Title The patient will have improved HS muscle length to 70 degrees needed for greater ease getting in and out of the car.  10/20/15   Time 4   Period Weeks   Status Achieved     PT SHORT TERM GOAL #2   Title The patient will have improved trunk and LE strength be able to stand 3 min for grooming, dressing tasks   Period Weeks   Status Achieved     PT SHORT TERM GOAL #3   Title The patient will be able to ambulate 500 feet with SPC and min to moderate back and knee pain needed for short distance community ambulation to church, Gaffer, symphony   Time 4   Period Weeks   Status Achieved     PT SHORT TERM GOAL #4   Title The patient will have improved hip flexor  muscle length to 10 degrees needed for walking longer distances with decreased energy expenditure   Time 4   Period Weeks   Status Achieved     PT SHORT TERM GOAL #5   Title Timed up and go test improved to 14 sec indicating improved gait speed and safety   Time 4   Period Weeks   Status Achieved           PT Long Term Goals - 01/15/16 1147      PT LONG TERM GOAL #1   Title The patient will be independent with safe self progression of HEP to improved walking distance and further conditioning   11/17/15   Time 8   Period Weeks   Status Achieved     PT LONG TERM GOAL #2   Title The patient will have improved core and hip strength to grossly 4+/5 holding trunk/head upright needed to stand for 5 min for kitchen work, combing hair  and shaving   Time 8   Period Weeks   Status Achieved     PT LONG TERM GOAL #3   Title The patient will be able to ambulate 700 feet with SPC with min to moderate back and knee pain for ambulating longer community distances to the symphony, church and Gaffer.     Time 8   Period Weeks   Status Achieved     PT LONG TERM GOAL #4   Title The patient will have improved Timed up and Go test  to 12 sec indicating improved gait speed and safety    Time 8   Period Weeks   Status Achieved     PT LONG TERM GOAL #5   Title Patient will have improved LE strength needed for 5x sit to stand test < 13 sec   Baseline 11.4 sec   (performed on 10/27/15)   Time 8   Period Weeks   Status Achieved     PT LONG TERM GOAL #6   Title BERG balance test of at least 48/56 indicating lower risk of falls    Time 8   Period Weeks   Status Achieved     PT LONG TERM GOAL #7   Title Six minute walk test to 560 feet indicating improved gait safety, speed and endurance   Time 6   Period Weeks   Status Achieved               Plan - 02-02-16 1143    Clinical Impression Statement Patient has met his goals.  Patient has increased lower extremity strength is  4+/5.  Patient is looking into a Silver Sneaker program that will fit him. Patient is ready for discharge.    Rehab Potential Good   Clinical Impairments Affecting Rehab Potential A fib;  Right TKR; lumbar spinal stenosis   PT Frequency 2x / week   PT Duration 6 weeks   PT Treatment/Interventions ADLs/Self Care Home Management;Electrical Stimulation;Moist Heat;Therapeutic exercise;Ultrasound;Patient/family education;Manual techniques;Taping;Dry needling;Gait training;Functional mobility training;Neuromuscular re-education   PT Next Visit Plan Discharge at this visit   PT Home Exercise Plan current HEP   Consulted and Agree with Plan of Care Patient      Patient will benefit from skilled therapeutic intervention in order to improve the following deficits and impairments:  Pain, Decreased balance, Decreased activity tolerance, Decreased strength, Decreased mobility, Difficulty walking  Visit Diagnosis: Muscle weakness (generalized)  Other abnormalities of gait and mobility       G-Codes - 2016/02/02 1140    Functional Assessment Tool Used berg balance score is 51/56   Functional Limitation Mobility: Walking and moving around   Mobility: Walking and Moving Around Goal Status [id number]) At least 40 percent but less than 60 percent impaired, limited or restricted   Mobility: Walking and Moving Around Discharge Status [phone]) At least 1 percent but less than 20 percent impaired, limited or restricted      Problem List Patient Active Problem List   Diagnosis Date Noted  . Paroxysmal (Persistent) atrial fibrillation ([place]) 10/01/2015  . Pneumonia   . Pneumonia due to organism   . Fever   . Acute respiratory failure ([place])   . Persistent atrial fibrillation ([place])   . Exertional dyspnea and fatigue 08/13/2013  . CAD S/P percutaneous coronary angioplasty   . Hyperlipidemia with target LDL less than 70   . Essential hypertension   . Obesity (BMI 30.0-34.9)   . Myocardial infarction; history  of   . Osteoarthritis of right knee 05/11/2012    Earlie Counts, PT 02/02/2016 11:52 AM   [facility] [address] [place], [place], [address] Phone: [phone]   Fax:  [phone]  Name: LOYDE ORTH MRN: [id number] Date of Birth: 29-Nov-1929   PHYSICAL THERAPY DISCHARGE SUMMARY  Visits from Start of Care: 27  Current functional level related to goals / functional outcomes: See above   Remaining deficits: See above   Education / Equipment: HEP  Plan: Patient agrees to discharge.  Patient goals were met. Patient is being discharged due to meeting the stated rehab goals.Thank you for referral. Earlie Counts, PT 01/15/16 11:52 AM    ?????

## 2016-01-15 NOTE — Patient Instructions (Signed)
Nustep for 10 min  Exercise classes  Will be helpful  Want to be shown around the equipment.  Have the gym set you up for a program to exercise in the weight and cardio room   Take a tour of the gym to see if it fits you.   Find a gym that is close by   [facility] [address] [place], [place] [address] Phone # [phone] Fax [phone]

## 2016-02-03 ENCOUNTER — Encounter (INDEPENDENT_AMBULATORY_CARE_PROVIDER_SITE_OTHER): Payer: Self-pay

## 2016-02-03 ENCOUNTER — Ambulatory Visit (INDEPENDENT_AMBULATORY_CARE_PROVIDER_SITE_OTHER): Payer: Medicare Other | Admitting: Cardiology

## 2016-02-03 ENCOUNTER — Encounter: Payer: Self-pay | Admitting: Cardiology

## 2016-02-03 VITALS — BP 119/70 | HR 75 | Ht 68.0 in | Wt 218.0 lb

## 2016-02-03 DIAGNOSIS — I480 Paroxysmal atrial fibrillation: Secondary | ICD-10-CM | POA: Diagnosis not present

## 2016-02-03 DIAGNOSIS — R0609 Other forms of dyspnea: Secondary | ICD-10-CM | POA: Diagnosis not present

## 2016-02-03 DIAGNOSIS — E785 Hyperlipidemia, unspecified: Secondary | ICD-10-CM | POA: Diagnosis not present

## 2016-02-03 DIAGNOSIS — I2510 Atherosclerotic heart disease of native coronary artery without angina pectoris: Secondary | ICD-10-CM | POA: Diagnosis not present

## 2016-02-03 DIAGNOSIS — I10 Essential (primary) hypertension: Secondary | ICD-10-CM | POA: Diagnosis not present

## 2016-02-03 DIAGNOSIS — Z9861 Coronary angioplasty status: Secondary | ICD-10-CM | POA: Diagnosis not present

## 2016-02-03 NOTE — Patient Instructions (Signed)
CONTINUE WITH CURRENT MEDICATIONS  You have been referred to SEE DR ALLRED / DONNA CARROLL (IF DR ALLRED IS PRESENT )- DISCUSS CONTINUING TIKOSYN.  Your physician wants you to follow-up in: [place]- 30 MIN You will receive a reminder letter in the mail two months in advance. If you don't receive a letter, please call our office to schedule the follow-up appointment.   If you need a refill on your cardiac medications before your next appointment, please call your pharmacy.

## 2016-02-03 NOTE — Progress Notes (Signed)
PCP: [name], MD  Clinic Note: Chief Complaint  Patient presents with  . Follow-up    lightheaded occasioanally.  . Atrial Fibrillation  . Coronary Artery Disease    HPI: Jerry [name] is a 82 y.o. male with a PMH below who presents today for 3 month follow-up for atrial fibrillation, and CAD. He has chronic exertional dyspnea and fatigue as well as labile blood pressures. He also was noted to have frequent PACs and palpitations. Cardiac History:   CAD/ previous MI -->  s/p PTCA of LCx 1996; BMS-RCA [facility]) 2009; Staged PCI RCA (70% ISR) --> 3.0 mm x 23 mm BMS, staged PCI Diag - 2.0 mm x 12 mm Mini Vision BMS (2.25 mm) in 2011,   He had a Myoview in 04/2012 that showed a fixed mid inferior-inferolateral defect/ infarct with no ischemia. EF was 53%.   Echocardiogram 09/2012 also showed normal EF of 55-60% w/ G2DD, aortic sclerosis w/o stenosis and mild PAH. - Most recent Echo in January 2016 now shows normal EF 60-65% with normal PA pressures.  In January to February 2017 he was diagnosed with atrial fibrillation. He underwent cardioversion. He was then started on Tikosyn and finally he was started on Tikosyn (by Dr. Rayann Heman) and underwent cardioversion again. This was, given by multifocal pneumonia with croup care admission  I actually last saw him in March 2016. When I last saw him, his activity level was really induced secondary to spinal stenosis with pain in his hips and back. Significant amount of energy is used for walking around. This does make him short of breath. He noted mild edema in his legs ago down at night. With his spinal stenosis and back pain he has difficulty with stability and balance.  Jerry [name] was most recently seen back in April and was pretty much back to his baseline having recovered from his pneumonia.  Studies Reviewed:   TEE 06/12/2015: EF 55-60%. Left atrial or appendage thrombus. Normal peak PA pressures of 33  mmHg.  Transthoracic Echo 06/22/2015: EF 60-65%. Mild LA dilation. Normal peak PA pressures of 32 mmHg.  Nasal packing by Dr. Erik Obey on February 4  Interval History: Patient returns today feeling a little bit more fatigued of normal. Disorder more tired and little more short of breath than usual. He be doing fine except for the last few weeks he's been started feeling more fatigue. Maybe a little bit more swelling than usual. He has not noted his heartbeat going fast or irregular, but has never been all that symptomatic with this.  He continues to do rehabilitation but is noticing fatigue and tiredness. He also notes just feeling occasionally dizzy. He chronically sleeps in a recliner but denies any significant dyspnea. But this is more because of his back pain then breathing. He has known poor balance and difficult to walking from peripheral neuropathy likely. He denies any chest tightness or pressure with rest or exertion, but has had dyspnea with exertion. Mild edema. No orthopnea or PND.  He notes occasional lightheadedness, dizziness, but no weakness or syncope/near syncope. No TIA/amaurosis fugax symptoms. No melena, hematochezia, hematuria, or epstaxis. No claudication.  ROS: A comprehensive was performed. Review of Systems  Constitutional: Positive for malaise/fatigue. Negative for chills and fever.  HENT: Positive for nosebleeds (No more since treatment).   Respiratory: Positive for shortness of breath (Stable exertional dyspnea).   Cardiovascular: Positive for leg swelling (Stable). Negative for claudication.  Gastrointestinal: Negative for melena.  Genitourinary: Negative for  hematuria.  Neurological: Positive for tingling (Occasionally noted in the right arm.).  Psychiatric/Behavioral: Negative for memory loss. The patient is not nervous/anxious and does not have insomnia.   All other systems reviewed and are negative.   Past Medical History:  Diagnosis Date  . Anxiety    . CAD S/P percutaneous coronary angioplasty 1996, 2009, 01/2010   PTCA of L Cx 1996; BMS-RCA [facility]) 2009; Staged PCI RCA (70% ISR) --> 3.0 mm x 23 mm BMS, staged PCI Diag - 2.0 mm x 12 mm Mini Vision BMS (2.25 mm)  . Cataracts, bilateral    immature  . Chronic back pain    scoliosis--pt states unable to have surgery bc of age  . GERD (gastroesophageal reflux disease)    takes Nexium daily  . History of colon polyps   . Hyperlipidemia   . Hypertension   . Myocardial infarction [facility]) 1996, 2009   x 2;last time was about 8-66yrs ago   . Obesity (BMI 30.0-34.9)   . OSA (obstructive sleep apnea)   . Osteoarthritis of both knees   . Peripheral edema   . Persistent atrial fibrillation ([place])   . Prostate cancer ([place]) 2005   seed implant    Past Surgical History:  Procedure Laterality Date  . CARDIOVERSION N/A 06/12/2015   Procedure: CARDIOVERSION;  Surgeon: Sanda Klein, MD;  Location: [place] ENDOSCOPY;  Service: Cardiovascular;  Laterality: N/A;  . CARDIOVERSION N/A 08/02/2015   Procedure: CARDIOVERSION;  Surgeon: [name], MD;  Location: [place];  Service: Cardiovascular;  Laterality: N/A;  . CHOLECYSTECTOMY    . COLONOSCOPY    . CORONARY ANGIOPLASTY WITH STENT PLACEMENT  1996, 2009, August 2011   PTCA of L Cx 1996; BMS-RCA [facility]) 2009; Staged PCI RCA (70% ISR) --> 3.0 mm x 23 mm BMS, staged PCI Diag - 2.0 mm x 12 mm Mini Vision BMS (2.25 mm)  . cyst removed  in college  . NEUROPLASTY / TRANSPOSITION MEDIAN NERVE AT CARPAL TUNNEL BILATERAL    . NM MYOVIEW LTD  November 2013   EF 53%; fixed mid inferior-inferolateral defect, infarct with no ischemia.  [name] NM MYOVIEW LTD  11/24/2010   ejection fraction 58%,left ventricle is normal size ,no evidence of inducible myocardial ischemia  . right knee arthrsoscopy  66yrs ago  . right trigger finger    . seeds placed into prostate   2005  . TEE WITHOUT CARDIOVERSION N/A 06/12/2015   Procedure: TRANSESOPHAGEAL ECHOCARDIOGRAM (TEE);  Surgeon:  Sanda Klein, MD;  Location: [facility] ENDOSCOPY;  Service: Cardiovascular;  Laterality: N/A;  . TOTAL KNEE ARTHROPLASTY  05/09/2012   Procedure: TOTAL KNEE ARTHROPLASTY;  Surgeon: Kerin Salen, MD;  Location: [place];  Service: Orthopedics;  Laterality: Right;  . TRANSTHORACIC ECHOCARDIOGRAM  09/26/2012   EF 55-60%. Grade 2 diastolic dysfunction with moderate concentric LVH. Aortic sclerosis.  [name] wisdom teeth extraccted      Prior to Admission medications   Medication Sig Start Date End Date Taking? Authorizing Provider  acetaminophen (TYLENOL) 500 MG tablet Take 1,000 mg by mouth daily as needed (pain).   Yes Historical Provider, MD  atorvastatin (LIPITOR) 40 MG tablet TAKE 1 TABLET DAILY AT BEDTIME 11/30/15  Yes Leonie Man, MD  CARTIA XT 240 MG 24 hr capsule TAKE 1 CAPSULE DAILY 01/05/16  Yes Sherran Needs, NP  Cholecalciferol (VITAMIN D3) 1000 units CAPS Take by mouth daily.   Yes Historical Provider, MD  diazepam (VALIUM) 2 MG tablet Take 2 mg by mouth daily  as needed for anxiety.    Yes Historical Provider, MD  dofetilide (TIKOSYN) 500 MCG capsule Take 1 capsule (500 mcg total) by mouth 2 (two) times daily. 08/04/15  Yes Renee Dyane Dustman, PA-C  ELIQUIS 5 MG TABS tablet TAKE 1 TABLET TWICE A DAY 01/05/16  Yes Leonie Man, MD  esomeprazole (NEXIUM) 40 MG capsule TAKE 1 CAPSULE DAILY 10/01/15  Yes Leonie Man, MD  ferrous sulfate 325 (65 FE) MG tablet Take 325 mg by mouth daily with breakfast.   Yes Historical Provider, MD  furosemide (LASIX) 20 MG tablet Take 1 tablet (20 mg total) by mouth daily as needed. 09/24/15  Yes Leonie Man, MD  hydrocortisone cream 0.5 % Apply 1 application topically as needed for itching.   Yes Historical Provider, MD  losartan (COZAAR) 100 MG tablet Take 1 tablet (100 mg total) by mouth daily. PLEASE CONTACT OFFICE FOR ADDITIONAL REFILLS 12/11/15  Yes Leonie Man, MD  magnesium hydroxide (MILK OF MAGNESIA) 400 MG/5ML suspension Take 30 mLs by mouth daily as  needed for mild constipation.   Yes Historical Provider, MD  magnesium oxide (MAG-OX) 400 MG tablet Take 1 tablet (400 mg total) by mouth 2 (two) times daily. 08/19/15  Yes Sherran Needs, NP  metoprolol succinate (TOPROL-XL) 25 MG 24 hr tablet TAKE 1 TABLET DAILY 11/10/15  Yes Leonie Man, MD  mirabegron ER (MYRBETRIQ) 25 MG TB24 tablet Take 25 mg by mouth daily.    Yes Historical Provider, MD  nitroGLYCERIN (NITROSTAT) 0.4 MG SL tablet Place 0.4 mg under the tongue every 5 (five) minutes as needed for chest pain.    Yes Historical Provider, MD  Polyethylene Glycol 3350 (MIRALAX PO) Take 17 g by mouth daily as needed (Constipation).    Yes Historical Provider, MD  potassium chloride SA (K-DUR,KLOR-CON) 20 MEQ tablet TAKE 1 TABLET DAILY 12/07/15  Yes Sherran Needs, NP  tamsulosin (FLOMAX) 0.4 MG CAPS capsule Take 0.4 mg by mouth at bedtime.  06/24/15  Yes Historical Provider, MD    Allergies  Allergen Reactions  . Crab [Shellfish Allergy] Rash  . Hydrocodone Hives  . Adhesive [Tape] Rash  . Oxycodone Rash    Social History   Social History  . Marital status: Married    Spouse name: N/A  . Number of children: N/A  . Years of education: N/A   Occupational History  . Retired    Social History Main Topics  . Smoking status: Former Smoker    Types: Pipe    Quit date: 03/12/1993  . Smokeless tobacco: Never Used     Comment: quit smoking pipe about 83yrs ago  . Alcohol use Yes     Comment: glass of wine daily  . Drug use: No  . Sexual activity: Not Currently   Other Topics Concern  . None   Social History Narrative   He is a married father of 2, grandfather of 2. He exercises at least 3 to 4 times a week but does something 6 to 7 days a week. He does not smoke. Drinks occasional alcohol.    History reviewed. No pertinent family history.   Wt Readings from Last 3 Encounters:  02/03/16 218 lb (98.9 kg)  09/24/15 211 lb (95.7 kg)  09/08/15 210 lb (95.3 kg)    PHYSICAL  EXAM BP 119/70   Pulse 75   Ht 5\' 8"  (1.727 m)   Wt 218 lb (98.9 kg)   BMI 33.15 kg/m  General  appearance: alert, cooperative, appears stated age, no distress; mildly obese  HEENT: [place]/AT, EOMI, MMM, anicteric sclera  Neck: no adenopathy, no carotid bruit and no JVD  Lungs: CTA B., normal percussion bilaterally and non-labored  Heart: Irregularly irregular rate and rhythm., S1 and S2 normal, no murmur, click, rub or gallop ; nondisplaced PMI  Abdomen: soft, non-tender; bowel sounds normal; no masses, no organomegaly; mildly obese (truncal) Extremities: extremities normal, atraumatic, no cyanosis, and edema roughly 1+ up to 2 + in some spots. spots.  Pulses: 2+ and symmetric; but mildly diminished in the legs.  Neurologic/Psych: Mental status: Alert, oriented X 3, thought content appropriate/ pleasant mood and affect; Cranial nerves: normal (II-XII grossly intact   Adult ECG Report  Rate: 63 ;  Rhythm: atrial fibrillation and Moderate LVH. Inferior infarct, age undetermined with T wave inversions in lead III. Otherwise normal axis, intervals and durations.; Also noted was LVH with nonspecific ST and T-wave changes   Narrative Interpretation: A. fib is now present.   Other studies Reviewed: Additional studies/ records that were reviewed today include:  Recent Labs:  None since April  Lab Results  Component Value Date   CHOL 155 07/29/2014   HDL 51 07/29/2014   LDLCALC 83 07/29/2014   TRIG 105 07/29/2014   CHOLHDL 2.9 02/12/2014    ASSESSMENT / PLAN: Problem List Items Addressed This Visit    Paroxysmal (Persistent) atrial fibrillation (HCC) -- CHA2DS2-VASc Score; On Eliquis - Primary (Chronic)    Now recurrent atrial fib despite being on Tikosyn.  This patients CHA2DS2-VASc Score and unadjusted Ischemic Stroke Rate (% per year) is equal to 7.2 % stroke rate/year from a score of 5 --> on Eliquis Above score calculated as 1 point each if present [CHF, HTN, DM,  Vascular=MI/PAD/Aortic Plaque, Age if 65-74, or Male]; 2 points each if present [Age > 75, or Stroke/TIA/TE]  With recurrent A. fib, I would refer him back to Dr. Rayann Heman to reevaluate options.      Relevant Orders   EKG 12-Lead (Completed)   Ambulatory referral to Cardiology   Hyperlipidemia with target LDL less than 70 (Chronic)    On Lipitor. Labs followed by PCP.      Exertional dyspnea and fatigue (Chronic)    Actually doing pretty well. Now back in A. fib, he has noticed some fatigue and dizziness. I think he is pretty symptomatic when he remains in A. fib. Would like to try to see if we can make a decision as to whether or not we continue with rhythm control options versus rate control options. Will defer to Dr. Rayann Heman.      Relevant Orders   EKG 12-Lead (Completed)   Essential hypertension (Chronic)    Well-controlled with combination of metoprolol with verapamil and continues for rate control along with losartan.      CAD S/P percutaneous coronary angioplasty (Chronic)    No actual anginal symptoms at this point. Last ischemic evaluation was in 2013. In the absence of symptoms, reluctant to check that now, however if further initial management beyond Tikosyn is indicated, we would probably want to do an ischemic evaluation.      Relevant Orders   EKG 12-Lead (Completed)    Other Visit Diagnoses   None.     Current medicines are reviewed at length with the patient today. (+/- concerns) none The following changes have been made: none  CONTINUE WITH CURRENT MEDICATIONS  You have been referred to SEE DR ALLRED / DONNA CARROLL (IF  DR Rayann Heman IS PRESENT )- DISCUSS CONTINUING TIKOSYN.  Your physician wants you to follow-up in: 4 MONTHS WITH DR Harrietta Incorvaia- 30 MIN  Studies Ordered:   Orders Placed This Encounter  Procedures  . Ambulatory referral to Cardiology  . EKG 12-Lead      Glenetta Hew, M.D., M.S. Interventional Cardiologist   Pager #  [phone] Phone # [phone] [address] [place], [place] [address]

## 2016-02-10 ENCOUNTER — Encounter: Payer: Self-pay | Admitting: Cardiology

## 2016-02-10 NOTE — Assessment & Plan Note (Signed)
Well-controlled with combination of metoprolol with verapamil and continues for rate control along with losartan.

## 2016-02-10 NOTE — Assessment & Plan Note (Signed)
No actual anginal symptoms at this point. Last ischemic evaluation was in 2013. In the absence of symptoms, reluctant to check that now, however if further initial management beyond Tikosyn is indicated, we would probably want to do an ischemic evaluation.

## 2016-02-10 NOTE — Assessment & Plan Note (Signed)
On Lipitor. Labs followed by PCP.

## 2016-02-10 NOTE — Assessment & Plan Note (Addendum)
Now recurrent atrial fib despite being on Tikosyn.  This patients CHA2DS2-VASc Score and unadjusted Ischemic Stroke Rate (% per year) is equal to 7.2 % stroke rate/year from a score of 5 --> on Eliquis Above score calculated as 1 point each if present [CHF, HTN, DM, Vascular=MI/PAD/Aortic Plaque, Age if 65-74, or Male]; 2 points each if present [Age > 75, or Stroke/TIA/TE]  With recurrent A. fib, I would refer him back to Dr. Rayann Heman to reevaluate options.

## 2016-02-10 NOTE — Assessment & Plan Note (Addendum)
Actually doing pretty well. Now back in A. fib, he has noticed some fatigue and dizziness. I think he is pretty symptomatic when he remains in A. fib. Would like to try to see if we can make a decision as to whether or not we continue with rhythm control options versus rate control options. Will defer to Dr. Rayann Heman.

## 2016-02-24 ENCOUNTER — Encounter: Payer: Self-pay | Admitting: Internal Medicine

## 2016-02-24 ENCOUNTER — Ambulatory Visit (INDEPENDENT_AMBULATORY_CARE_PROVIDER_SITE_OTHER): Payer: Medicare Other | Admitting: Internal Medicine

## 2016-02-24 ENCOUNTER — Encounter (INDEPENDENT_AMBULATORY_CARE_PROVIDER_SITE_OTHER): Payer: Self-pay

## 2016-02-24 VITALS — BP 117/68 | HR 62 | Ht 70.0 in | Wt 230.0 lb

## 2016-02-24 DIAGNOSIS — Z9861 Coronary angioplasty status: Secondary | ICD-10-CM

## 2016-02-24 DIAGNOSIS — R531 Weakness: Secondary | ICD-10-CM

## 2016-02-24 DIAGNOSIS — I2510 Atherosclerotic heart disease of native coronary artery without angina pectoris: Secondary | ICD-10-CM

## 2016-02-24 DIAGNOSIS — I481 Persistent atrial fibrillation: Secondary | ICD-10-CM

## 2016-02-24 DIAGNOSIS — I10 Essential (primary) hypertension: Secondary | ICD-10-CM | POA: Diagnosis not present

## 2016-02-24 DIAGNOSIS — I480 Paroxysmal atrial fibrillation: Secondary | ICD-10-CM

## 2016-02-24 DIAGNOSIS — R5383 Other fatigue: Secondary | ICD-10-CM

## 2016-02-24 DIAGNOSIS — I4819 Other persistent atrial fibrillation: Secondary | ICD-10-CM

## 2016-02-24 LAB — CBC WITH DIFFERENTIAL/PLATELET
Basophils Absolute: 101 cells/uL (ref 0–200)
Basophils Relative: 1 %
EOS PCT: 4 %
Eosinophils Absolute: 404 cells/uL (ref 15–500)
HCT: 40.9 % (ref 38.5–50.0)
HEMOGLOBIN: 14.2 g/dL (ref 13.2–17.1)
LYMPHS ABS: 1818 {cells}/uL (ref 850–3900)
Lymphocytes Relative: 18 %
MCH: 32.6 pg (ref 27.0–33.0)
MCHC: 34.7 g/dL (ref 32.0–36.0)
MCV: 94 fL (ref 80.0–100.0)
MPV: 8.3 fL (ref 7.5–12.5)
Monocytes Absolute: 707 cells/uL (ref 200–950)
Monocytes Relative: 7 %
NEUTROS ABS: 7070 {cells}/uL (ref 1500–7800)
Neutrophils Relative %: 70 %
Platelets: 187 10*3/uL (ref 140–400)
RBC: 4.35 MIL/uL (ref 4.20–5.80)
RDW: 14.5 % (ref 11.0–15.0)
WBC: 10.1 10*3/uL (ref 3.8–10.8)

## 2016-02-24 LAB — BASIC METABOLIC PANEL
BUN: 21 mg/dL (ref 7–25)
CHLORIDE: 104 mmol/L (ref 98–110)
CO2: 24 mmol/L (ref 20–31)
Calcium: 9.1 mg/dL (ref 8.6–10.3)
Creat: 0.94 mg/dL (ref 0.70–1.11)
Glucose, Bld: 107 mg/dL — ABNORMAL HIGH (ref 65–99)
Potassium: 4.5 mmol/L (ref 3.5–5.3)
SODIUM: 138 mmol/L (ref 135–146)

## 2016-02-24 LAB — MAGNESIUM: Magnesium: 1.8 mg/dL (ref 1.5–2.5)

## 2016-02-24 LAB — TSH: TSH: 2 m[IU]/L (ref 0.40–4.50)

## 2016-02-24 NOTE — Patient Instructions (Signed)
Medication Instructions:  Your physician has recommended you make the following change in your medication:    1) Decrease Lipitor to 20 mg night and decrease Metoprolol to 12.5 mg for 2 weeks then stop both   Labwork: Your physician recommends that you return for lab work today TSH/SBC/ESR/SMP/MAG   Testing/Procedures: None ordered   Follow-Up: Your physician recommends that you schedule a follow-up appointment in: 6 weeks with Dr Rayann Heman    Any Other Special Instructions Will Be Listed Below (If Applicable).     If you need a refill on your cardiac medications before your next appointment, please call your pharmacy.

## 2016-02-25 LAB — SEDIMENTATION RATE: SED RATE: 8 mm/h (ref 0–20)

## 2016-02-25 NOTE — Progress Notes (Signed)
 Electrophysiology Office Note   Date:  02/25/2016   ID:  [name], DOB 08/09/1929, MRN [id number]  PCP:  Dean Mitchell, MD  Cardiologist:  Dr Harding Primary Electrophysiologist: Jazmaine Fuelling, MD    Chief Complaint  Patient presents with  . Atrial Fibrillation     History of Present Illness: Jerry [name] is a 82 y.o. male who presents today for electrophysiology evaluation.   The patient has persistent atrial fibrillation, OSA, and CAD.  His afib was diagnosed in January.  He failed cardioversion and was admitted for tikosyn 2/17 (refer to my H&P from that time).    Interestingly, while he was admitted electively for tikosyn initiation, he developed acute respiratory failure and was found to have pneumonia.  He did achieve and maintain sinus rhythm and was eventually discharged.  He did not feel better with sinus rhythm.  He reports that he continues to be weak and fatigued.  For several months, this was attributed to his pneumonia and hospital recovery.  Despite sinus rhythm, he did not feel very well.  More recently, he was evaluated by Dr Harding and was noted to be back in afib.  He is therefore referred back to me for further discussions. The patient is elderly and frail.  He reports that he frequently gets weak.  His legs hurt and "give out".  He has spinal stenosis and chronic back pain which also contribute.   Past Medical History:  Diagnosis Date  . Anxiety   . CAD S/P percutaneous coronary angioplasty 1996, 2009, 01/2010   PTCA of L Cx 1996; BMS-RCA ([place]) 2009; Staged PCI RCA (70% ISR) --> 3.0 mm x 23 mm BMS, staged PCI Diag - 2.0 mm x 12 mm Mini Vision BMS (2.25 mm)  . Cataracts, bilateral    immature  . Chronic back pain    scoliosis--pt states unable to have surgery bc of age  . GERD (gastroesophageal reflux disease)    takes Nexium daily  . History of colon polyps   . Hyperlipidemia   . Hypertension   . Myocardial infarction (HCC) 1996, 2009   x  2;last time was about 8-10yrs ago   . Obesity (BMI 30.0-34.9)   . OSA (obstructive sleep apnea)   . Osteoarthritis of both knees   . Peripheral edema   . Persistent atrial fibrillation (HCC)   . Prostate cancer (HCC) 2005   seed implant   Past Surgical History:  Procedure Laterality Date  . CARDIOVERSION N/A 06/12/2015   Procedure: CARDIOVERSION;  Surgeon: Mihai Croitoru, MD;  Location: [facility];  Service: Cardiovascular;  Laterality: N/A;  . CARDIOVERSION N/A 08/02/2015   Procedure: CARDIOVERSION;  Surgeon: Dontavis Tschantz, MD;  Location: [facility] OR;  Service: Cardiovascular;  Laterality: N/A;  . CHOLECYSTECTOMY    . COLONOSCOPY    . CORONARY ANGIOPLASTY WITH STENT PLACEMENT  1996, 2009, August 2011   PTCA of L Cx 1996; BMS-RCA ([place]) 2009; Staged PCI RCA (70% ISR) --> 3.0 mm x 23 mm BMS, staged PCI Diag - 2.0 mm x 12 mm Mini Vision BMS (2.25 mm)  . cyst removed  in college  . NEUROPLASTY / TRANSPOSITION MEDIAN NERVE AT CARPAL TUNNEL BILATERAL    . NM MYOVIEW LTD  November 2013   EF 53%; fixed mid inferior-inferolateral defect, infarct with no ischemia.  . NM MYOVIEW LTD  11/24/2010   ejection fraction 58%,left ventricle is normal size ,no evidence of inducible myocardial ischemia  . right knee arthrsoscopy  13yrs ago  .   right trigger finger    . seeds placed into prostate   2005  . TEE WITHOUT CARDIOVERSION N/A 06/12/2015   Procedure: TRANSESOPHAGEAL ECHOCARDIOGRAM (TEE);  Surgeon: Sanda Klein, MD;  Location: [facility] ENDOSCOPY;  Service: Cardiovascular;  Laterality: N/A;  . TOTAL KNEE ARTHROPLASTY  05/09/2012   Procedure: TOTAL KNEE ARTHROPLASTY;  Surgeon: Kerin Salen, MD;  Location: [place];  Service: Orthopedics;  Laterality: Right;  . TRANSTHORACIC ECHOCARDIOGRAM  09/26/2012   EF 55-60%. Grade 2 diastolic dysfunction with moderate concentric LVH. Aortic sclerosis.  [name] wisdom teeth extraccted       Current Outpatient Prescriptions  Medication Sig Dispense Refill  . acetaminophen  (TYLENOL) 500 MG tablet Take 1,000 mg by mouth daily as needed (pain).    [name] apixaban (ELIQUIS) 5 MG TABS tablet Take 5 mg by mouth 2 (two) times daily.    [name] BREWERS YEAST PO Take by mouth as directed.     . Cholecalciferol (VITAMIN D3) 1000 units CAPS Take 1 capsule by mouth daily.     . diazepam (VALIUM) 2 MG tablet Take 2 mg by mouth daily as needed for anxiety.     [name] diltiazem (CARTIA XT) 240 MG 24 hr capsule Take 240 mg by mouth daily.    [name] dofetilide (TIKOSYN) 500 MCG capsule Take 1 capsule (500 mcg total) by mouth 2 (two) times daily. 180 capsule 3  . esomeprazole (NEXIUM) 40 MG capsule Take 40 mg by mouth daily at 12 noon.    . ferrous sulfate 325 (65 FE) MG tablet Take 325 mg by mouth daily with breakfast.    . furosemide (LASIX) 20 MG tablet Take 20 mg by mouth daily as needed ([place]).    . hydrocortisone cream 0.5 % Apply 1 application topically as needed for itching. Use as directed    . losartan (COZAAR) 100 MG tablet Take 100 mg by mouth daily.    . magnesium hydroxide (MILK OF MAGNESIA) 400 MG/5ML suspension Take 30 mLs by mouth daily as needed for mild constipation.    . magnesium oxide (MAG-OX) 400 MG tablet Take 1 tablet (400 mg total) by mouth 2 (two) times daily. 60 tablet 6  . mirabegron ER (MYRBETRIQ) 25 MG TB24 tablet Take 25 mg by mouth daily.     . nitroGLYCERIN (NITROSTAT) 0.4 MG SL tablet Place 0.4 mg under the tongue every 5 (five) minutes as needed for chest pain.     . Polyethylene Glycol 3350 (MIRALAX PO) Take 17 g by mouth daily as needed (Constipation).     . potassium chloride SA (K-DUR,KLOR-CON) 20 MEQ tablet Take 20 mEq by mouth daily.    . tamsulosin (FLOMAX) 0.4 MG CAPS capsule Take 0.4 mg by mouth at bedtime.      No current facility-administered medications for this visit.     Allergies:   Crab [shellfish allergy]; Hydrocodone; Adhesive [tape]; and Oxycodone   Social History:  The patient  reports that he quit smoking about 22 years ago. His smoking  use included Pipe. He has never used smokeless tobacco. He reports that he drinks alcohol. He reports that he does not use drugs.   Family History: + HTN   ROS:  Please see the history of present illness.   All other systems are reviewed and negative.    PHYSICAL EXAM: VS:  BP 117/68   Pulse 62   Ht 5' 10" (1.778 m)   Wt 230 lb (104.3 kg)   BMI 33.00 kg/m  , BMI Body  mass index is 33 kg/m. GEN: elderly, walks slowly with a cane, in no acute distress  HEENT: normal  Neck: no JVD, carotid bruits, or masses Cardiac: RRR  Respiratory:  clear to auscultation bilaterally, normal work of breathing GI: soft, nontender, nondistended, + BS MS: no deformity or atrhy  Skin: warm and dry  Neuro:  Strength and sensation are intact Psych: euthymic mood, full affect  EKG:  EKG is ordered today. The ekg ordered today shows sinus rhythm, normal ekg, stable QTc   Recent Labs: 06/09/2015: Brain Natriuretic Peptide 261.1 08/10/2015: ALT 9 02/24/2016: BUN 21; Creat 0.94; Hemoglobin 14.2; Magnesium 1.8; Platelets 187; Potassium 4.5; Sodium 138; TSH 2.00    Lipid Panel     Component Value Date/Time   CHOL 155 07/29/2014 0943   TRIG 105 07/29/2014 0943   HDL 51 07/29/2014 0943   CHOLHDL 2.9 02/12/2014 1039   VLDL 21 02/12/2014 1039   LDLCALC 83 07/29/2014 0943     Wt Readings from Last 3 Encounters:  02/24/16 230 lb (104.3 kg)  02/03/16 218 lb (98.9 kg)  09/24/15 211 lb (95.7 kg)      ASSESSMENT AND PLAN:  1.  Persistent atrial fibrillation The patient has a h/o afib.  He is on tikosyn and has done well since initiation.  Though he recently had afib in the office with Dr Harding, he has mostly been in sinus rhythm with tikosyn.  He has returned to sinus rhythm today but is unaware of any clinical change in symptoms.  I am not convinced that his symptoms are related to afib.  Continue current medical therapy.  If his afib progresses, would consider rate control strategies long  term. Continue long term anticoagulation.  2. Weakness/ fatigue Will stop lipitor which may be contributing to muscle aches and weakness Wean metoprolol to off as he appears to be reasonably rate controlled when in afib.  I worry that metoprolol may be contributing to symptoms.  I will also check cbc, tsh, esr, and electrolytes to evaluate for metabolic cause.  Return for further assessment in 6 weeks.   Orders Placed This Encounter  Procedures  . TSH  . CBC with Differential  . Sed Rate (ESR)  . Basic Metabolic Panel (BMET)  . Magnesium  . EKG 12-Lead     Signed,  , MD  02/25/2016 10:23 PM     [facility] [address] [place] [address] [phone] (office) [phone] (fax)  

## 2016-03-01 ENCOUNTER — Encounter: Payer: Self-pay | Admitting: Interventional Cardiology

## 2016-03-04 ENCOUNTER — Other Ambulatory Visit: Payer: Self-pay | Admitting: Cardiology

## 2016-03-05 ENCOUNTER — Other Ambulatory Visit (HOSPITAL_COMMUNITY): Payer: Self-pay | Admitting: Nurse Practitioner

## 2016-03-07 ENCOUNTER — Ambulatory Visit: Payer: Medicare Other | Admitting: Internal Medicine

## 2016-03-08 DIAGNOSIS — D235 Other benign neoplasm of skin of trunk: Secondary | ICD-10-CM | POA: Diagnosis not present

## 2016-03-08 DIAGNOSIS — D2339 Other benign neoplasm of skin of other parts of face: Secondary | ICD-10-CM | POA: Diagnosis not present

## 2016-03-08 DIAGNOSIS — Z23 Encounter for immunization: Secondary | ICD-10-CM | POA: Diagnosis not present

## 2016-03-08 DIAGNOSIS — D230 Other benign neoplasm of skin of lip: Secondary | ICD-10-CM | POA: Diagnosis not present

## 2016-03-08 DIAGNOSIS — D487 Neoplasm of uncertain behavior of other specified sites: Secondary | ICD-10-CM | POA: Diagnosis not present

## 2016-03-08 DIAGNOSIS — I4891 Unspecified atrial fibrillation: Secondary | ICD-10-CM | POA: Diagnosis not present

## 2016-03-08 DIAGNOSIS — Z0000 Encounter for general adult medical examination without abnormal findings: Secondary | ICD-10-CM | POA: Diagnosis not present

## 2016-03-13 ENCOUNTER — Other Ambulatory Visit: Payer: Self-pay | Admitting: Cardiology

## 2016-03-14 NOTE — Telephone Encounter (Signed)
Rx request sent to pharmacy.  

## 2016-03-29 ENCOUNTER — Other Ambulatory Visit: Payer: Self-pay | Admitting: Cardiology

## 2016-03-29 NOTE — Telephone Encounter (Signed)
Rx(s) sent to pharmacy electronically.  

## 2016-04-06 ENCOUNTER — Ambulatory Visit (INDEPENDENT_AMBULATORY_CARE_PROVIDER_SITE_OTHER): Payer: Medicare Other | Admitting: Internal Medicine

## 2016-04-06 ENCOUNTER — Ambulatory Visit: Payer: Medicare Other | Admitting: Internal Medicine

## 2016-04-06 ENCOUNTER — Encounter: Payer: Self-pay | Admitting: Internal Medicine

## 2016-04-06 VITALS — BP 118/64 | HR 66 | Ht 70.0 in | Wt 229.0 lb

## 2016-04-06 DIAGNOSIS — I2510 Atherosclerotic heart disease of native coronary artery without angina pectoris: Secondary | ICD-10-CM

## 2016-04-06 DIAGNOSIS — I4891 Unspecified atrial fibrillation: Secondary | ICD-10-CM

## 2016-04-06 DIAGNOSIS — Z9861 Coronary angioplasty status: Secondary | ICD-10-CM

## 2016-04-06 MED ORDER — DILTIAZEM HCL ER COATED BEADS 240 MG PO CP24: 240.0000 mg | ORAL_CAPSULE | Freq: Every day | ORAL | 3 refills | Status: DC

## 2016-04-06 NOTE — Patient Instructions (Signed)
Medication Instructions:  Your physician recommends that you continue on your current medications as directed. Please refer to the Current Medication list given to you today.   Labwork: Your physician recommends that you return for lab work non-fasting BMP/Mag   Testing/Procedures: None ordered   Follow-Up:  Your physician wants you to follow-up in: 3 months with Dr Ellyn Hack and 6 months with Dr Vallery Ridge will receive a reminder letter in the mail two months in advance. If you don't receive a letter, please call our office to schedule the follow-up appointment.    Any Other Special Instructions Will Be Listed Below (If Applicable).     If you need a refill on your cardiac medications before your next appointment, please call your pharmacy.

## 2016-04-06 NOTE — Progress Notes (Signed)
Electrophysiology Office Note   Date:  04/06/2016   ID:  ANDREWJOSEPH FORRISTER, DOB 11/12/1929, MRN [id number]  PCP:  [name], MD  Cardiologist:  Dr Ellyn Hack Primary Electrophysiologist: [name], MD    Chief Complaint  Patient presents with  . Atrial Fibrillation     History of Present Illness: Jerry [name] is a 82 y.o. male who presents today for electrophysiology follow-up.   The patient has persistent atrial fibrillation, OSA, and CAD.  His afib was diagnosed in January.  He is maintaining sinus currently with tikosyn.  The patient is elderly and frail.  He reports that he frequently gets weak.  His legs hurt and "give out".  He has spinal stenosis and chronic back pain which also contribute.     Past Medical History:  Diagnosis Date  . Anxiety   . CAD S/P percutaneous coronary angioplasty 1996, 2009, 01/2010   PTCA of L Cx 1996; BMS-RCA [facility]) 2009; Staged PCI RCA (70% ISR) --> 3.0 mm x 23 mm BMS, staged PCI Diag - 2.0 mm x 12 mm Mini Vision BMS (2.25 mm)  . Cataracts, bilateral    immature  . Chronic back pain    scoliosis--pt states unable to have surgery bc of age  . GERD (gastroesophageal reflux disease)    takes Nexium daily  . History of colon polyps   . Hyperlipidemia   . Hypertension   . Myocardial infarction 1996, 2009   x 2;last time was about 8-38yrs ago   . Obesity (BMI 30.0-34.9)   . OSA (obstructive sleep apnea)   . Osteoarthritis of both knees   . Peripheral edema   . Persistent atrial fibrillation ([place])   . Prostate cancer ([place]) 2005   seed implant   Past Surgical History:  Procedure Laterality Date  . CARDIOVERSION N/A 06/12/2015   Procedure: CARDIOVERSION;  Surgeon: Sanda Klein, MD;  Location: [facility] ENDOSCOPY;  Service: Cardiovascular;  Laterality: N/A;  . CARDIOVERSION N/A 08/02/2015   Procedure: CARDIOVERSION;  Surgeon: [name], MD;  Location: [place];  Service: Cardiovascular;  Laterality: N/A;  . CHOLECYSTECTOMY    .  COLONOSCOPY    . CORONARY ANGIOPLASTY WITH STENT PLACEMENT  1996, 2009, August 2011   PTCA of L Cx 1996; BMS-RCA [facility]) 2009; Staged PCI RCA (70% ISR) --> 3.0 mm x 23 mm BMS, staged PCI Diag - 2.0 mm x 12 mm Mini Vision BMS (2.25 mm)  . cyst removed  in college  . NEUROPLASTY / TRANSPOSITION MEDIAN NERVE AT CARPAL TUNNEL BILATERAL    . NM MYOVIEW LTD  November 2013   EF 53%; fixed mid inferior-inferolateral defect, infarct with no ischemia.  [name] NM MYOVIEW LTD  11/24/2010   ejection fraction 58%,left ventricle is normal size ,no evidence of inducible myocardial ischemia  . right knee arthrsoscopy  43yrs ago  . right trigger finger    . seeds placed into prostate   2005  . TEE WITHOUT CARDIOVERSION N/A 06/12/2015   Procedure: TRANSESOPHAGEAL ECHOCARDIOGRAM (TEE);  Surgeon: Sanda Klein, MD;  Location: [facility] ENDOSCOPY;  Service: Cardiovascular;  Laterality: N/A;  . TOTAL KNEE ARTHROPLASTY  05/09/2012   Procedure: TOTAL KNEE ARTHROPLASTY;  Surgeon: Kerin Salen, MD;  Location: [place];  Service: Orthopedics;  Laterality: Right;  . TRANSTHORACIC ECHOCARDIOGRAM  09/26/2012   EF 55-60%. Grade 2 diastolic dysfunction with moderate concentric LVH. Aortic sclerosis.  [name] wisdom teeth extraccted       Current Outpatient Prescriptions  Medication Sig Dispense Refill  .  acetaminophen (TYLENOL) 500 MG tablet Take 1,000 mg by mouth daily as needed (pain).    [name] apixaban (ELIQUIS) 5 MG TABS tablet Take 5 mg by mouth 2 (two) times daily.    [name] BREWERS YEAST PO Take by mouth as directed.     . Cholecalciferol (VITAMIN D3) 1000 units CAPS Take 1 capsule by mouth daily.     . diazepam (VALIUM) 2 MG tablet Take 2 mg by mouth daily as needed for anxiety.     [name] diltiazem (CARTIA XT) 240 MG 24 hr capsule Take 1 capsule (240 mg total) by mouth daily. 90 capsule 3  . dofetilide (TIKOSYN) 500 MCG capsule Take 1 capsule (500 mcg total) by mouth 2 (two) times daily. 180 capsule 3  . esomeprazole (NEXIUM) 40 MG capsule Take  40 mg by mouth daily at 12 noon.    . ferrous sulfate 325 (65 FE) MG tablet Take 325 mg by mouth daily with breakfast.    . furosemide (LASIX) 20 MG tablet Take 20 mg by mouth daily as needed ([place]).    . hydrocortisone cream 0.5 % Apply 1 application topically as needed for itching. Use as directed    . losartan (COZAAR) 100 MG tablet Take 100 mg by mouth daily.    . magnesium hydroxide (MILK OF MAGNESIA) 400 MG/5ML suspension Take 30 mLs by mouth daily as needed for mild constipation.    . magnesium oxide (MAG-OX) 400 MG tablet Take 1 tablet (400 mg total) by mouth 2 (two) times daily. 60 tablet 6  . mirabegron ER (MYRBETRIQ) 25 MG TB24 tablet Take 25 mg by mouth daily.     . nitroGLYCERIN (NITROSTAT) 0.4 MG SL tablet Place 0.4 mg under the tongue every 5 (five) minutes as needed for chest pain (MAX 3 TABLETS).     . Polyethylene Glycol 3350 (MIRALAX PO) Take 17 g by mouth daily as needed (Constipation).     . potassium chloride SA (K-DUR,KLOR-CON) 20 MEQ tablet Take 20 mEq by mouth daily.    . tamsulosin (FLOMAX) 0.4 MG CAPS capsule Take 0.4 mg by mouth at bedtime.      No current facility-administered medications for this visit.     Allergies:   Crab [shellfish allergy]; Hydrocodone; Adhesive [tape]; and Oxycodone   Social History:  The patient  reports that he quit smoking about 23 years ago. His smoking use included Pipe. He has never used smokeless tobacco. He reports that he drinks alcohol. He reports that he does not use drugs.   Family History: + HTN   ROS:  Please see the history of present illness.   All other systems are reviewed and negative.    PHYSICAL EXAM: VS:  BP 118/64   Pulse 66   Ht 5\' 10"  (1.778 m)   Wt 229 lb (103.9 kg)   BMI 32.86 kg/m  , BMI Body mass index is 32.86 kg/m. GEN: elderly, walks slowly with a cane, in no acute distress  HEENT: normal  Neck: no JVD, carotid bruits, or masses Cardiac: RRR  Respiratory:  clear to auscultation bilaterally,  normal work of breathing GI: soft, nontender, nondistended, + BS MS: no deformity or atrhy  Skin: warm and dry  Neuro:  Strength and sensation are intact Psych: euthymic mood, full affect  EKG:  EKG is ordered today. The ekg ordered today shows sinus rhythm with PACs and PVCs, QTc 429 msec   Recent Labs: 06/09/2015: Brain Natriuretic Peptide 261.1 08/10/2015: ALT 9 02/24/2016: BUN 21;  Creat 0.94; Hemoglobin 14.2; Magnesium 1.8; Platelets 187; Potassium 4.5; Sodium 138; TSH 2.00    Lipid Panel     Component Value Date/Time   CHOL 155 07/29/2014 0943   TRIG 105 07/29/2014 0943   HDL 51 07/29/2014 0943   CHOLHDL 2.9 02/12/2014 1039   VLDL 21 02/12/2014 1039   LDLCALC 83 07/29/2014 0943     Wt Readings from Last 3 Encounters:  04/06/16 229 lb (103.9 kg)  02/24/16 230 lb (104.3 kg)  02/03/16 218 lb (98.9 kg)      ASSESSMENT AND PLAN:  1.  Persistent atrial fibrillation Currently maintaining sinus rhythm.  I am not convinced that his symptoms are related to afib.  Continue current medical therapy.  If his afib progresses, would consider rate control strategies long term. Continue long term anticoagulation. Bmet, mg at this time  2. Weakness/ fatigue Stable  His wife would like for them to "down size" from their 4 bedroom house to a garden home or independent living.  She worries that he will die and that she will have to deal with their large home and its contents.   The patient is not ready to move.  They continue to work on a resolution to this.  Follow-up with Dr Ellyn Hack in 3 months Return to see me in 6 months unless additional problems arise.  Orders Placed This Encounter  Procedures  . Basic metabolic panel  . Magnesium  . EKG 12-Lead     Signed, [name], MD  04/06/2016 9:15 PM     [place] [address] [phone] (office) [phone] (fax)

## 2016-04-07 ENCOUNTER — Other Ambulatory Visit: Payer: Medicare Other | Admitting: *Deleted

## 2016-04-07 DIAGNOSIS — I4891 Unspecified atrial fibrillation: Secondary | ICD-10-CM | POA: Diagnosis not present

## 2016-04-07 DIAGNOSIS — I2510 Atherosclerotic heart disease of native coronary artery without angina pectoris: Secondary | ICD-10-CM

## 2016-04-07 DIAGNOSIS — Z9861 Coronary angioplasty status: Secondary | ICD-10-CM

## 2016-04-07 DIAGNOSIS — I10 Essential (primary) hypertension: Secondary | ICD-10-CM

## 2016-04-07 DIAGNOSIS — R002 Palpitations: Secondary | ICD-10-CM

## 2016-04-07 DIAGNOSIS — Z79899 Other long term (current) drug therapy: Secondary | ICD-10-CM

## 2016-04-07 DIAGNOSIS — E669 Obesity, unspecified: Secondary | ICD-10-CM

## 2016-04-07 DIAGNOSIS — E66811 Obesity, class 1: Secondary | ICD-10-CM

## 2016-04-07 DIAGNOSIS — E785 Hyperlipidemia, unspecified: Secondary | ICD-10-CM

## 2016-04-08 LAB — BASIC METABOLIC PANEL
BUN: 23 mg/dL (ref 7–25)
CO2: 23 mmol/L (ref 20–31)
Calcium: 9.3 mg/dL (ref 8.6–10.3)
Chloride: 103 mmol/L (ref 98–110)
Creat: 1.14 mg/dL — ABNORMAL HIGH (ref 0.70–1.11)
Glucose, Bld: 110 mg/dL — ABNORMAL HIGH (ref 65–99)
POTASSIUM: 4.5 mmol/L (ref 3.5–5.3)
SODIUM: 138 mmol/L (ref 135–146)

## 2016-04-08 LAB — MAGNESIUM: MAGNESIUM: 1.7 mg/dL (ref 1.5–2.5)

## 2016-05-03 ENCOUNTER — Other Ambulatory Visit: Payer: Self-pay | Admitting: Cardiology

## 2016-05-03 DIAGNOSIS — H2513 Age-related nuclear cataract, bilateral: Secondary | ICD-10-CM | POA: Diagnosis not present

## 2016-05-03 DIAGNOSIS — H40033 Anatomical narrow angle, bilateral: Secondary | ICD-10-CM | POA: Diagnosis not present

## 2016-05-03 DIAGNOSIS — H04123 Dry eye syndrome of bilateral lacrimal glands: Secondary | ICD-10-CM | POA: Diagnosis not present

## 2016-05-26 DIAGNOSIS — J029 Acute pharyngitis, unspecified: Secondary | ICD-10-CM | POA: Diagnosis not present

## 2016-05-26 DIAGNOSIS — R319 Hematuria, unspecified: Secondary | ICD-10-CM | POA: Diagnosis not present

## 2016-05-27 ENCOUNTER — Telehealth: Payer: Self-pay | Admitting: Internal Medicine

## 2016-05-27 ENCOUNTER — Emergency Department (HOSPITAL_COMMUNITY): Payer: Medicare Other

## 2016-05-27 ENCOUNTER — Emergency Department (HOSPITAL_COMMUNITY)
Admission: EM | Admit: 2016-05-27 | Discharge: 2016-05-28 | Disposition: A | Discharge status: Home / Self Care | Payer: Medicare Other | Attending: Emergency Medicine | Admitting: Emergency Medicine

## 2016-05-27 ENCOUNTER — Encounter (HOSPITAL_COMMUNITY): Payer: Self-pay | Admitting: Emergency Medicine

## 2016-05-27 DIAGNOSIS — I10 Essential (primary) hypertension: Secondary | ICD-10-CM | POA: Diagnosis not present

## 2016-05-27 DIAGNOSIS — Z87891 Personal history of nicotine dependence: Secondary | ICD-10-CM | POA: Diagnosis not present

## 2016-05-27 DIAGNOSIS — R531 Weakness: Secondary | ICD-10-CM

## 2016-05-27 DIAGNOSIS — Z8546 Personal history of malignant neoplasm of prostate: Secondary | ICD-10-CM | POA: Diagnosis not present

## 2016-05-27 DIAGNOSIS — R05 Cough: Secondary | ICD-10-CM | POA: Diagnosis not present

## 2016-05-27 DIAGNOSIS — R319 Hematuria, unspecified: Secondary | ICD-10-CM | POA: Insufficient documentation

## 2016-05-27 DIAGNOSIS — I2510 Atherosclerotic heart disease of native coronary artery without angina pectoris: Secondary | ICD-10-CM | POA: Diagnosis not present

## 2016-05-27 DIAGNOSIS — Z79899 Other long term (current) drug therapy: Secondary | ICD-10-CM | POA: Insufficient documentation

## 2016-05-27 DIAGNOSIS — Z96651 Presence of right artificial knee joint: Secondary | ICD-10-CM | POA: Insufficient documentation

## 2016-05-27 DIAGNOSIS — I252 Old myocardial infarction: Secondary | ICD-10-CM | POA: Diagnosis not present

## 2016-05-27 LAB — BASIC METABOLIC PANEL
ANION GAP: 11 (ref 5–15)
BUN: 23 mg/dL — ABNORMAL HIGH (ref 6–20)
CO2: 22 mmol/L (ref 22–32)
Calcium: 9.3 mg/dL (ref 8.9–10.3)
Chloride: 104 mmol/L (ref 101–111)
Creatinine, Ser: 1.11 mg/dL (ref 0.61–1.24)
GFR calc Af Amer: >60 mL/min (ref >60)
GFR calc non Af Amer: 59 mL/min — ABNORMAL LOW (ref >60)
GLUCOSE: 110 mg/dL — AB (ref 65–99)
POTASSIUM: 3.8 mmol/L (ref 3.5–5.1)
Sodium: 137 mmol/L (ref 135–145)

## 2016-05-27 LAB — CBC
HEMATOCRIT: 44.6 % (ref 39.0–52.0)
HEMOGLOBIN: 14.9 g/dL (ref 13.0–17.0)
MCH: 32.9 pg (ref 26.0–34.0)
MCHC: 33.4 g/dL (ref 30.0–36.0)
MCV: 98.5 fL (ref 78.0–100.0)
Platelets: 148 10*3/uL — ABNORMAL LOW (ref 150–400)
RBC: 4.53 MIL/uL (ref 4.22–5.81)
RDW: 13.5 % (ref 11.5–15.5)
WBC: 7.1 10*3/uL (ref 4.0–10.5)

## 2016-05-27 LAB — TROPONIN I: Troponin I: <0.03 ng/mL (ref <0.03)

## 2016-05-27 LAB — LACTIC ACID, PLASMA: Lactic Acid, Venous: 1.2 mmol/L (ref 0.5–1.9)

## 2016-05-27 LAB — BRAIN NATRIURETIC PEPTIDE: B Natriuretic Peptide: 70.7 pg/mL (ref 0.0–100.0)

## 2016-05-27 MED ORDER — SODIUM CHLORIDE 0.9 % IV BOLUS (SEPSIS)
1000.0000 mL | Freq: Once | INTRAVENOUS | Status: AC
  Administered 2016-05-27: 1000 mL via INTRAVENOUS

## 2016-05-27 NOTE — ED Provider Notes (Signed)
[facility] DEPT Provider Note   CSN: [id number] Arrival date & time: 05/27/16  1737     History   Chief Complaint Chief Complaint  Patient presents with  . Weakness    HPI Jerry [name] is a 82 y.o. male.   Weakness  Primary symptoms include no focal weakness, no loss of sensation, no speech change. This is a new problem. The current episode started yesterday. The problem has not changed since onset.There was no focality noted. There has been no fever. Pertinent negatives include no shortness of breath, no chest pain, no vomiting, no altered mental status, no confusion and no headaches. There were no medications administered prior to arrival.    Past Medical History:  Diagnosis Date  . Anxiety   . CAD S/P percutaneous coronary angioplasty 1996, 2009, 01/2010   PTCA of L Cx 1996; BMS-RCA [facility]) 2009; Staged PCI RCA (70% ISR) --> 3.0 mm x 23 mm BMS, staged PCI Diag - 2.0 mm x 12 mm Mini Vision BMS (2.25 mm)  . Cataracts, bilateral    immature  . Chronic back pain    scoliosis--pt states unable to have surgery bc of age  . GERD (gastroesophageal reflux disease)    takes Nexium daily  . History of colon polyps   . Hyperlipidemia   . Hypertension   . Myocardial infarction 1996, 2009   x 2;last time was about 8-36yrs ago   . Obesity (BMI 30.0-34.9)   . OSA (obstructive sleep apnea)   . Osteoarthritis of both knees   . Peripheral edema   . Persistent atrial fibrillation ([place])   . Prostate cancer ([place]) 2005   seed implant    Patient Active Problem List   Diagnosis Date Noted  . Paroxysmal (Persistent) atrial fibrillation (HCC) -- CHA2DS2-VASc Score; On Eliquis 10/01/2015  . Persistent atrial fibrillation ([place])   . Exertional dyspnea and fatigue 08/13/2013  . CAD S/P percutaneous coronary angioplasty   . Hyperlipidemia with target LDL less than 70   . Essential hypertension   . Obesity (BMI 30.0-34.9)   . Myocardial infarction; history of   .  Osteoarthritis of right knee 05/11/2012    Past Surgical History:  Procedure Laterality Date  . CARDIOVERSION N/A 06/12/2015   Procedure: CARDIOVERSION;  Surgeon: Sanda Klein, MD;  Location: [facility];  Service: Cardiovascular;  Laterality: N/A;  . CARDIOVERSION N/A 08/02/2015   Procedure: CARDIOVERSION;  Surgeon: [name], MD;  Location: [facility];  Service: Cardiovascular;  Laterality: N/A;  . CHOLECYSTECTOMY    . COLONOSCOPY    . CORONARY ANGIOPLASTY WITH STENT PLACEMENT  1996, 2009, August 2011   PTCA of L Cx 1996; BMS-RCA [facility]) 2009; Staged PCI RCA (70% ISR) --> 3.0 mm x 23 mm BMS, staged PCI Diag - 2.0 mm x 12 mm Mini Vision BMS (2.25 mm)  . cyst removed  in college  . NEUROPLASTY / TRANSPOSITION MEDIAN NERVE AT CARPAL TUNNEL BILATERAL    . NM MYOVIEW LTD  November 2013   EF 53%; fixed mid inferior-inferolateral defect, infarct with no ischemia.  [name] NM MYOVIEW LTD  11/24/2010   ejection fraction 58%,left ventricle is normal size ,no evidence of inducible myocardial ischemia  . right knee arthrsoscopy  92yrs ago  . right trigger finger    . seeds placed into prostate   2005  . TEE WITHOUT CARDIOVERSION N/A 06/12/2015   Procedure: TRANSESOPHAGEAL ECHOCARDIOGRAM (TEE);  Surgeon: Sanda Klein, MD;  Location: [facility];  Service: Cardiovascular;  Laterality: N/A;  .  TOTAL KNEE ARTHROPLASTY  05/09/2012   Procedure: TOTAL KNEE ARTHROPLASTY;  Surgeon: Kerin Salen, MD;  Location: [facility];  Service: Orthopedics;  Laterality: Right;  . TRANSTHORACIC ECHOCARDIOGRAM  09/26/2012   EF 55-60%. Grade 2 diastolic dysfunction with moderate concentric LVH. Aortic sclerosis.  [name] wisdom teeth extraccted         Home Medications    Prior to Admission medications   Medication Sig Start Date End Date Taking? Authorizing Provider  acetaminophen (TYLENOL) 500 MG tablet Take 1,000 mg by mouth daily as needed (pain).   Yes Historical Provider, MD  Cholecalciferol (VITAMIN D3) 1000 units CAPS  Take 1 capsule by mouth daily.    Yes Historical Provider, MD  diazepam (VALIUM) 2 MG tablet Take 2 mg by mouth daily as needed for anxiety.    Yes Historical Provider, MD  diltiazem (CARTIA XT) 240 MG 24 hr capsule Take 1 capsule (240 mg total) by mouth daily. 04/06/16  Yes [name], MD  dofetilide (TIKOSYN) 500 MCG capsule Take 1 capsule (500 mcg total) by mouth 2 (two) times daily. 08/04/15  Yes Renee Dyane Dustman, PA-C  ELIQUIS 5 MG TABS tablet TAKE 1 TABLET TWICE A DAY 05/04/16  Yes Leonie Man, MD  esomeprazole (NEXIUM) 40 MG capsule Take 40 mg by mouth daily at 12 noon.   Yes Historical Provider, MD  Fe Bisgly-Vit C-Vit B12-FA (GENTLE IRON) 28-60-0.008-0.4 MG CAPS Take 1 capsule by mouth at bedtime.   Yes Historical Provider, MD  furosemide (LASIX) 20 MG tablet Take 20 mg by mouth daily as needed ([place]).   Yes Historical Provider, MD  hydrocortisone cream 0.5 % Apply 1 application topically daily. Use as directed   Yes Historical Provider, MD  losartan (COZAAR) 100 MG tablet Take 100 mg by mouth daily.   Yes Historical Provider, MD  magnesium hydroxide (MILK OF MAGNESIA) 400 MG/5ML suspension Take 30 mLs by mouth daily as needed for mild constipation.   Yes Historical Provider, MD  magnesium oxide (MAG-OX) 400 MG tablet Take 1 tablet (400 mg total) by mouth 2 (two) times daily. Patient taking differently: Take 200 mg by mouth 2 (two) times daily.  08/19/15  Yes Sherran Needs, NP  mirabegron ER (MYRBETRIQ) 25 MG TB24 tablet Take 25 mg by mouth daily.    Yes Historical Provider, MD  nitroGLYCERIN (NITROSTAT) 0.4 MG SL tablet Place 0.4 mg under the tongue every 5 (five) minutes as needed for chest pain (MAX 3 TABLETS).    Yes Historical Provider, MD  phenol (CHLORASEPTIC) 1.4 % LIQD Use as directed 1 spray in the mouth or throat as needed for throat irritation / pain.   Yes Historical Provider, MD  Polyethylene Glycol 3350 (MIRALAX PO) Take 17 g by mouth daily as needed (Constipation).     Yes Historical Provider, MD  potassium chloride SA (K-DUR,KLOR-CON) 20 MEQ tablet Take 20 mEq by mouth daily.   Yes Historical Provider, MD  tamsulosin (FLOMAX) 0.4 MG CAPS capsule Take 0.4 mg by mouth at bedtime.  06/24/15  Yes Historical Provider, MD  ciprofloxacin (CIPRO) 500 MG tablet Take 500 mg by mouth 2 (two) times daily. 05/26/16   Historical Provider, MD    Family History No family history on file.  Social History Social History  Substance Use Topics  . Smoking status: Former Smoker    Types: Pipe    Quit date: 03/12/1993  . Smokeless tobacco: Never Used     Comment: quit smoking pipe about 58yrs ago  .  Alcohol use Yes     Comment: glass of wine daily     Allergies   Crab [shellfish allergy]; Hydrocodone; Adhesive [tape]; and Oxycodone   Review of Systems Review of Systems  Constitutional: Negative for fatigue.  HENT: Negative for congestion.   Respiratory: Negative for choking, chest tightness and shortness of breath.   Cardiovascular: Negative for chest pain.  Gastrointestinal: Negative for vomiting.  Neurological: Positive for weakness. Negative for speech change, focal weakness and headaches.  Psychiatric/Behavioral: Negative for confusion.  All other systems reviewed and are negative.    Physical Exam Updated Vital Signs BP 133/96   Pulse 69   Temp 99 F (37.2 C) (Oral)   Resp 24   SpO2 93%   Physical Exam  Constitutional: He is oriented to person, place, and time. He appears well-developed and well-nourished.  HENT:  Head: Normocephalic and atraumatic.  Eyes: Conjunctivae and EOM are normal.  Neck: Normal range of motion.  Cardiovascular: Normal rate.   Pulmonary/Chest: Effort normal. No respiratory distress. He has no wheezes.  Abdominal: Soft. He exhibits no distension.  Musculoskeletal: Normal range of motion.  Neurological: He is alert and oriented to person, place, and time. No cranial nerve deficit. Coordination normal.  Skin: Skin is  warm and dry.  Nursing note and vitals reviewed.    ED Treatments / Results  Labs (all labs ordered are listed, but only abnormal results are displayed) Labs Reviewed  BASIC METABOLIC PANEL - Abnormal; Notable for the following:       Result Value   Glucose, Bld 110 (*)    BUN 23 (*)    GFR calc non Af Amer 59 (*)    All other components within normal limits  CBC - Abnormal; Notable for the following:    Platelets 148 (*)    All other components within normal limits  URINALYSIS, ROUTINE W REFLEX MICROSCOPIC - Abnormal; Notable for the following:    APPearance HAZY (*)    Hgb urine dipstick LARGE (*)    Protein, ur 100 (*)    All other components within normal limits  LACTIC ACID, PLASMA  BRAIN NATRIURETIC PEPTIDE  TROPONIN I  TROPONIN I    EKG  EKG Interpretation  Date/Time:  Friday May 27 2016 18:07:06 EST Ventricular Rate:  97 PR Interval:    QRS Duration: 92 QT Interval:  368 QTC Calculation: 467 R Axis:     Text Interpretation:  Atrial fibrillation Possible Inferior infarct , age undetermined Abnormal ECG Confirmed by [facility] MD, Corene Cornea [phone]) on 05/27/2016 9:30:52 PM       Radiology Dg Chest 2 View  Result Date: 05/27/2016 CLINICAL DATA:  Cough and weakness for 2 days. EXAM: CHEST  2 VIEW COMPARISON:  08/03/2015 FINDINGS: Unchanged mild cardiomegaly. The lungs are clear. There is no pleural effusion. Pulmonary vasculature is normal. Hilar and mediastinal contours are unchanged for IMPRESSION: Stable mild cardiomegaly.  No consolidation or effusion. Electronically Signed   By: Andreas Newport M.D.   On: 05/27/2016 23:03    Procedures Procedures (including critical care time)  Medications Ordered in ED Medications  sodium chloride 0.9 % bolus 1,000 mL (0 mLs Intravenous Stopped 05/28/16 0040)  sodium chloride 0.9 % bolus 1,000 mL (0 mLs Intravenous Stopped 05/28/16 0155)     Initial Impression / Assessment and Plan / ED Course  I have reviewed  the triage vital signs and the nursing notes.  Pertinent labs & imaging results that were available during my care of  the patient were reviewed by me and considered in my medical decision making (see chart for details).  Clinical Course     Here with weakness. Suspect infectious cause, will workup accordingly. No focal findings to suggest cva or neurologic cause.  Workup negative, significnatly improved with a liter of fluids. Will plan for another with pcp follow up wfor hematuria (or urologist which he already has)  Final Clinical Impressions(s) / ED Diagnoses   Final diagnoses:  Weakness  Hematuria, unspecified type    New Prescriptions Discharge Medication List as of 05/28/2016 12:09 AM       Merrily Pew, MD 05/28/16 1116

## 2016-05-27 NOTE — Telephone Encounter (Signed)
Spoke with daughter and let her know he should not be taking Cipro with Tikosyn as it can cause QT prolongation.  She has a call into his PCP now to discuss the other issues he is experiencing.  I have asked that she have them call in something that does not interact with the Tikosyn.  Also let her know if he passes out as he has taken the medication today he would need to be evaluated at the ER.  She verbalized understanding and appreciated my return call

## 2016-05-27 NOTE — ED Triage Notes (Signed)
Pt states hes had MIs in the past and has afib. Went to see PCP yesterday and was given cipro for a possible UTI, but PCP called and said he did not have a UTI. But pt took cipro anyway. Cardiology told patient to stop taking cipro. Pt is taking elaquis. Pt states yesterday he collapsed due to weakness yesterday, called ems twice yesterday. Pt still states hes been very weak. Pt is AAOX4, speech clear, no facial droop.

## 2016-05-27 NOTE — Telephone Encounter (Signed)
New Message:   Pt was put on an antibioticCipro) yesterday by his primary care doctor.Today pt is feeling weak,h e was losing his balance,he fell 2 times last night. Pt is concerned because he is on Tikosyn and is now on Cipro.Please call to advise.

## 2016-05-28 DIAGNOSIS — R319 Hematuria, unspecified: Secondary | ICD-10-CM | POA: Diagnosis not present

## 2016-05-28 LAB — URINALYSIS, ROUTINE W REFLEX MICROSCOPIC
BACTERIA UA: NONE SEEN
BILIRUBIN URINE: NEGATIVE
Glucose, UA: NEGATIVE mg/dL
Ketones, ur: NEGATIVE mg/dL
Leukocytes, UA: NEGATIVE
Nitrite: NEGATIVE
PROTEIN: 100 mg/dL — AB
SPECIFIC GRAVITY, URINE: 1.023 (ref 1.005–1.030)
SQUAMOUS EPITHELIAL / LPF: NONE SEEN
pH: 5 (ref 5.0–8.0)

## 2016-05-28 LAB — TROPONIN I

## 2016-05-28 MED ORDER — SODIUM CHLORIDE 0.9 % IV BOLUS (SEPSIS)
1000.0000 mL | Freq: Once | INTRAVENOUS | Status: AC
  Administered 2016-05-28: 1000 mL via INTRAVENOUS

## 2016-05-28 NOTE — ED Notes (Signed)
Pt able to ambulate in hallway around nurses station with steady gait noted; pt did not appear to be in any acute distress

## 2016-05-28 NOTE — ED Provider Notes (Signed)
Patient is ambulatory and tolerating by mouth. He is requesting to go home. Denies any dizziness. Troponin negative 2. Labs stable. Hematuria in UA which she is being followed as an outpatient for.  Patient stable for discharge per plan established by Dr. Dayna Barker.  BP 133/96   Pulse 69   Temp 99 F (37.2 C) (Oral)   Resp 24   SpO2 93%     Ezequiel Essex, MD 05/28/16 [phone]

## 2016-06-01 DIAGNOSIS — R5383 Other fatigue: Secondary | ICD-10-CM | POA: Diagnosis not present

## 2016-06-01 DIAGNOSIS — R310 Gross hematuria: Secondary | ICD-10-CM | POA: Diagnosis not present

## 2016-06-03 DIAGNOSIS — R310 Gross hematuria: Secondary | ICD-10-CM | POA: Diagnosis not present

## 2016-06-03 DIAGNOSIS — N3941 Urge incontinence: Secondary | ICD-10-CM | POA: Diagnosis not present

## 2016-06-03 DIAGNOSIS — C61 Malignant neoplasm of prostate: Secondary | ICD-10-CM | POA: Diagnosis not present

## 2016-06-16 DIAGNOSIS — H2511 Age-related nuclear cataract, right eye: Secondary | ICD-10-CM | POA: Diagnosis not present

## 2016-06-27 DIAGNOSIS — H2512 Age-related nuclear cataract, left eye: Secondary | ICD-10-CM | POA: Diagnosis not present

## 2016-06-30 DIAGNOSIS — H52202 Unspecified astigmatism, left eye: Secondary | ICD-10-CM | POA: Diagnosis not present

## 2016-06-30 DIAGNOSIS — H5231 Anisometropia: Secondary | ICD-10-CM | POA: Diagnosis not present

## 2016-06-30 DIAGNOSIS — H2512 Age-related nuclear cataract, left eye: Secondary | ICD-10-CM | POA: Diagnosis not present

## 2016-07-12 ENCOUNTER — Other Ambulatory Visit: Payer: Self-pay | Admitting: Physician Assistant

## 2016-07-14 ENCOUNTER — Ambulatory Visit (INDEPENDENT_AMBULATORY_CARE_PROVIDER_SITE_OTHER): Payer: Medicare Other | Admitting: Cardiology

## 2016-07-14 ENCOUNTER — Encounter: Payer: Self-pay | Admitting: Cardiology

## 2016-07-14 VITALS — BP 142/87 | HR 81 | Ht 70.0 in | Wt 227.2 lb

## 2016-07-14 DIAGNOSIS — I2510 Atherosclerotic heart disease of native coronary artery without angina pectoris: Secondary | ICD-10-CM | POA: Diagnosis not present

## 2016-07-14 DIAGNOSIS — I10 Essential (primary) hypertension: Secondary | ICD-10-CM | POA: Diagnosis not present

## 2016-07-14 DIAGNOSIS — I481 Persistent atrial fibrillation: Secondary | ICD-10-CM | POA: Diagnosis not present

## 2016-07-14 DIAGNOSIS — E785 Hyperlipidemia, unspecified: Secondary | ICD-10-CM

## 2016-07-14 DIAGNOSIS — Z9861 Coronary angioplasty status: Secondary | ICD-10-CM

## 2016-07-14 DIAGNOSIS — I214 Non-ST elevation (NSTEMI) myocardial infarction: Secondary | ICD-10-CM | POA: Diagnosis not present

## 2016-07-14 DIAGNOSIS — R0609 Other forms of dyspnea: Secondary | ICD-10-CM | POA: Diagnosis not present

## 2016-07-14 DIAGNOSIS — I480 Paroxysmal atrial fibrillation: Secondary | ICD-10-CM

## 2016-07-14 DIAGNOSIS — I4819 Other persistent atrial fibrillation: Secondary | ICD-10-CM

## 2016-07-14 NOTE — Assessment & Plan Note (Signed)
Likely, this is paroxysmal. Maintaining sinus rhythm at present on Tikosyn, however he did have a brief setback in December. He is due to follow back up again with Dr. Rayann Heman for further management. Remains adequately treated on Eliquis.

## 2016-07-14 NOTE — Patient Instructions (Signed)
No change with current medications    Your physician wants you to follow-up in 6 months with DR HARDING. You will receive a reminder letter in the mail two months in advance. If you don't receive a letter, please call our office to schedule the follow-up appointment.   If you need a refill on your cardiac medications before your next appointment, please call your pharmacy.  

## 2016-07-14 NOTE — Assessment & Plan Note (Signed)
Borderline control today. He says that usually is much better than this at home. Continue to monitor.

## 2016-07-14 NOTE — Progress Notes (Signed)
PCP: [name], MD  Clinic Note: Chief Complaint  Patient presents with  . Follow-up    3 months; Pt states no Sx.   . Coronary Artery Disease  . Atrial Fibrillation    On diltiazem and Tikosyn; Eliquis    HPI: Jerry [name] is a 84 y.o. male with a PMH below who presents today for Six-month follow-up for CAD and persistent atrial fibrillation. He has frequent PACs and palpitations as well as prolonged episodes of A. Fib. He is now being followed by Dr. Rayann Heman for A. Fib. Cardiac History:   CAD/ previous MI -->  s/p PTCA of LCx 1996; BMS-RCA [facility]) 2009; Staged PCI RCA (70% ISR) --> 3.0 mm x 23 mm BMS, staged PCI Diag - 2.0 mm x 12 mm Mini Vision BMS (2.25 mm) in 2011,   He had a Myoview in 04/2012 that showed a fixed mid inferior-inferolateral defect/ infarct with no ischemia. EF was 53%.   Echocardiogram 09/2012 also showed normal EF of 55-60% w/ G2DD, aortic sclerosis w/o stenosis and mild PAH. - Most recent Echo in January 2016 now shows normal EF 60-65% with normal PA pressures.  In January to February 2017 he was diagnosed with atrial fibrillation. He underwent cardioversion. He was then started on Tikosyn and finally he was started on Tikosyn (by Dr. Rayann Heman) and underwent cardioversion again.   Jerry [name] was last seen on November 1 by Dr. Rayann Heman and I last saw him in August last year. He indicated Dr. Jackalyn Lombard length every week, and get out mostly related to spinal stenosis and chronic back pain. Was maintaining sinus rhythm on Tikosyn. -- Since then, he had a recurrence of A. fib on December 22. He says that he was on medication that interacted with Tikosyn, Tikosyn have been temporarily stopped, but now he is back on it.  Recent Hospitalizations: ER visit December 22 for weakness - hematuria. Was noted to be in A. fib. No further hematuria. Did not end up having ureteroscopy  Studies Reviewed: None  Interval History: [name] presents today pretty  stable overall from a cardiac standpoint. He knew something felt funny in December, but he did not really feel symptomatic A. fib from a standpoint of having a tachycardia symptom or significantly worsening dyspnea. He denied any chest tightness or pressure. He still has not had any recurrent chest tightness or pressure. No heart failure symptoms of PND, orthopnea and only has maybe trace edema. He is really limited by his spinal stenosis as far as leg weakness and easily fatigued.  He safely underwent bilateral cataract surgeries over the past month or so. No major issues. He says he can see much better now.   No chest pain or shortness of breath with rest or exertion.  No PND, orthopnea with minimal edema.  No palpitations, lightheadedness, dizziness, or syncope/near syncope. No TIA/amaurosis fugax symptoms. No melena, hematochezia, or epstaxis.  No further hematuria since December No claudication.  ROS: A comprehensive was performed. Review of Systems  Constitutional: Positive for malaise/fatigue (Tires easily).  HENT: Negative for congestion, nosebleeds and sinus pain.   Eyes: Positive for blurred vision (Recovering from cataract surgery.).  Respiratory: Negative for cough, sputum production and wheezing.   Cardiovascular:       Per history of present illness  Gastrointestinal: Negative for blood in stool, constipation, diarrhea, heartburn and melena.  Genitourinary: Positive for hematuria (No further symptoms since December).  Musculoskeletal: Positive for back pain. Negative for falls.  Neurological: Positive  for dizziness (Poor balance) and weakness (Mostly in his legs from spinal stenosis). Negative for speech change, focal weakness and seizures.  Psychiatric/Behavioral: Negative for depression and memory loss. The patient does not have insomnia.   All other systems reviewed and are negative.   Past Medical History:  Diagnosis Date  . Anxiety   . CAD S/P percutaneous coronary  angioplasty 1996, 2009, 01/2010   PTCA of L Cx 1996; BMS-RCA [facility]) 2009; Staged PCI RCA (70% ISR) --> 3.0 mm x 23 mm BMS, staged PCI Diag - 2.0 mm x 12 mm Mini Vision BMS (2.25 mm)  . Cataracts, bilateral    immature  . Chronic back pain    scoliosis--pt states unable to have surgery bc of age  . GERD (gastroesophageal reflux disease)    takes Nexium daily  . History of colon polyps   . Hyperlipidemia   . Hypertension   . Myocardial infarction 1996, 2009   x 2;last time was about 8-71yrs ago   . Obesity (BMI 30.0-34.9)   . OSA (obstructive sleep apnea)   . Osteoarthritis of both knees   . Peripheral edema   . Persistent atrial fibrillation ([place])   . Prostate cancer ([place]) 2005   seed implant    Past Surgical History:  Procedure Laterality Date  . CARDIOVERSION N/A 06/12/2015   Procedure: CARDIOVERSION;  Surgeon: Sanda Klein, MD;  Location: [facility] ENDOSCOPY;  Service: Cardiovascular;  Laterality: N/A;  . CARDIOVERSION N/A 08/02/2015   Procedure: CARDIOVERSION;  Surgeon: [name], MD;  Location: [facility];  Service: Cardiovascular;  Laterality: N/A;  . CHOLECYSTECTOMY    . COLONOSCOPY    . CORONARY ANGIOPLASTY WITH STENT PLACEMENT  1996, 2009, August 2011   PTCA of L Cx 1996; BMS-RCA [facility]) 2009; Staged PCI RCA (70% ISR) --> 3.0 mm x 23 mm BMS, staged PCI Diag - 2.0 mm x 12 mm Mini Vision BMS (2.25 mm)  . cyst removed  in college  . NEUROPLASTY / TRANSPOSITION MEDIAN NERVE AT CARPAL TUNNEL BILATERAL    . NM MYOVIEW LTD  November 2013   EF 53%; fixed mid inferior-inferolateral defect, infarct with no ischemia.  [name] NM MYOVIEW LTD  11/24/2010   ejection fraction 58%,left ventricle is normal size ,no evidence of inducible myocardial ischemia  . right knee arthrsoscopy  17yrs ago  . right trigger finger    . seeds placed into prostate   2005  . TEE WITHOUT CARDIOVERSION N/A 06/12/2015   Procedure: TRANSESOPHAGEAL ECHOCARDIOGRAM (TEE);  Surgeon: Sanda Klein, MD;  Location[facility]  ENDOSCOPY;  Service: Cardiovascular: EF 55-60%.No LA or LAA thrombus. Normal Peal PAP ~33 mmHg.  [name] TOTAL KNEE ARTHROPLASTY  05/09/2012   Procedure: TOTAL KNEE ARTHROPLASTY;  Surgeon: Kerin Salen, MD;  Location: [facility];  Service: Orthopedics;  Laterality: Right;  . TRANSTHORACIC ECHOCARDIOGRAM  06/2015   EF 60-65%. Mild LA dilation. Normal PA pressures: 32 mmHg. Aortic sclerosis but no stenosis.  [name] wisdom teeth extraccted      Current Meds  Medication Sig  . acetaminophen (TYLENOL) 500 MG tablet Take 1,000 mg by mouth daily as needed (pain).  . Cholecalciferol (VITAMIN D3) 1000 units CAPS Take 1 capsule by mouth daily.   . ciprofloxacin (CIPRO) 500 MG tablet Take 500 mg by mouth 2 (two) times daily.  . diazepam (VALIUM) 2 MG tablet Take 2 mg by mouth daily as needed for anxiety.   [name] diltiazem (CARTIA XT) 240 MG 24 hr capsule Take 1 capsule (240 mg total)  by mouth daily.  [name] dofetilide (TIKOSYN) 500 MCG capsule Take 1 capsule (500 mcg total) by mouth 2 (two) times daily.  [name] ELIQUIS 5 MG TABS tablet TAKE 1 TABLET TWICE A DAY  . esomeprazole (NEXIUM) 40 MG capsule Take 40 mg by mouth daily at 12 noon.  [name] Fe Bisgly-Vit C-Vit B12-FA (GENTLE IRON) 28-60-0.008-0.4 MG CAPS Take 1 capsule by mouth at bedtime.  . furosemide (LASIX) 20 MG tablet Take 20 mg by mouth daily as needed ([place]).  . hydrocortisone cream 0.5 % Apply 1 application topically daily. Use as directed  . losartan (COZAAR) 100 MG tablet Take 100 mg by mouth daily.  . magnesium hydroxide (MILK OF MAGNESIA) 400 MG/5ML suspension Take 30 mLs by mouth daily as needed for mild constipation.  . magnesium oxide (MAG-OX) 400 MG tablet Take 1 tablet (400 mg total) by mouth 2 (two) times daily. (Patient taking differently: Take 200 mg by mouth 2 (two) times daily. )  . mirabegron ER (MYRBETRIQ) 25 MG TB24 tablet Take 25 mg by mouth daily.   . nitroGLYCERIN (NITROSTAT) 0.4 MG SL tablet Place 0.4 mg under the tongue every 5 (five) minutes as  needed for chest pain (MAX 3 TABLETS).   . phenol (CHLORASEPTIC) 1.4 % LIQD Use as directed 1 spray in the mouth or throat as needed for throat irritation / pain.  . Polyethylene Glycol 3350 (MIRALAX PO) Take 17 g by mouth daily as needed (Constipation).   . potassium chloride SA (K-DUR,KLOR-CON) 20 MEQ tablet Take 20 mEq by mouth daily.  . tamsulosin (FLOMAX) 0.4 MG CAPS capsule Take 0.4 mg by mouth at bedtime.     Allergies  Allergen Reactions  . Crab [Shellfish Allergy] Rash  . Hydrocodone Hives  . Adhesive [Tape] Rash  . Oxycodone Rash    Social History   Social History  . Marital status: Married    Spouse name: N/A  . Number of children: N/A  . Years of education: N/A   Occupational History  . Retired    Social History Main Topics  . Smoking status: Former Smoker    Types: Pipe    Quit date: 03/12/1993  . Smokeless tobacco: Never Used     Comment: quit smoking pipe about 67yrs ago  . Alcohol use Yes     Comment: glass of wine daily  . Drug use: No  . Sexual activity: Not Currently   Other Topics Concern  . None   Social History Narrative   He is a married father of 2, grandfather of 2. He exercises at least 3 to 4 times a week but does something 6 to 7 days a week. He does not smoke. Drinks occasional alcohol.     family history is not on file.  Wt Readings from Last 3 Encounters:  07/14/16 103.1 kg (227 lb 3.2 oz)  04/06/16 103.9 kg (229 lb)  02/24/16 104.3 kg (230 lb)    PHYSICAL EXAM BP (!) 142/87   Pulse 81   Ht 5\' 10"  (1.778 m)   Wt 103.1 kg (227 lb 3.2 oz)   BMI 32.60 kg/m  General appearance: alert, cooperative, appears stated age, no distress; mildly obese  HEENT: [place]/AT, EOMI, MMM, anicteric sclera  Neck: no adenopathy, no carotid bruit and no JVD  Lungs: CTA B., normal percussion bilaterally and non-labored  Heart: Irregularly irregular rate and rhythm., S1 and S2 normal, no murmur, click, rub or gallop ; nondisplaced PMI  Abdomen:  soft, non-tender; bowel sounds normal;  no masses, no organomegaly; mildly obese (truncal) Extremities: extremities normal, atraumatic, no cyanosis, and edema roughly 1+ up to 2 + in some spots. spots.  Pulses: 2+ and symmetric; but mildly diminished in the legs.  Neurologic/Psych: Mental status: Alert, oriented X 3, thought content appropriate/ pleasant mood and affect;    Adult ECG Report  Rate: 81 ;  Rhythm: normal sinus rhythm and Inferior MI, age undetermined. Otherwise normal axis, intervals and durations.;   Narrative Interpretation: Stable EKG   Other studies Reviewed: Additional studies/ records that were reviewed today include:  Recent Labs:  Followed by PCP Lab Results  Component Value Date   CHOL 155 07/29/2014   HDL 51 07/29/2014   LDLCALC 83 07/29/2014   TRIG 105 07/29/2014   CHOLHDL 2.9 02/12/2014     ASSESSMENT / PLAN: Problem List Items Addressed This Visit    Persistent atrial fibrillation [facility]):  CHA2DS2-VASc Score 4 (Chronic)    Likely, this is paroxysmal. Maintaining sinus rhythm at present on Tikosyn, however he did have a brief setback in December. He is due to follow back up again with Dr. Rayann Heman for further management. Remains adequately treated on Eliquis.      Paroxysmal (Persistent) atrial fibrillation (HCC) -- CHA2DS2-VASc Score; On Eliquis (Chronic)   Relevant Orders   EKG 12-Lead   Hyperlipidemia with target LDL less than 70 (Chronic)    Remains on stable dose of Lipitor. Monitored by PCP.      Relevant Orders   EKG 12-Lead   Essential hypertension (Chronic)    Borderline control today. He says that usually is much better than this at home. Continue to monitor.      Relevant Orders   EKG 12-Lead   Myocardial infarction; history of (Chronic)    Last MI was about 11 years ago. No recurrent MI symptoms. He really is not active. He is however symptomatic with A. fib.      Exertional dyspnea and fatigue (Chronic)    Seems to be relatively  stable without any worsening symptoms. He unfortunately is just not able to really do much the way of any stimulating exercise due to his own personal medical conditions      CAD S/P percutaneous coronary angioplasty - Primary (Chronic)    No active anginal symptoms. Last ischemic evaluation in 2013. From now on, unless he has active anginal type symptoms, would not pursue additional management. Continues to be on combination of ARB and calcium channel blocker as opposed to beta blocker likely had better heart rate responsiveness to diltiazem.           Current medicines are reviewed at length with the patient today. (+/- concerns) n/a The following changes have been made: --   Patient Instructions  No change with current medications   Your physician wants you to follow-up in 6 months with DR HARDING. You will receive a reminder letter in the mail two months in advance. If you don't receive a letter, please call our office to schedule the follow-up appointment.   If you need a refill on your cardiac medications before your next appointment, please call your pharmacy.    Studies Ordered:   Orders Placed This Encounter  Procedures  . EKG 12-Lead      Glenetta Hew, M.D., M.S. Interventional Cardiologist   Pager # [phone] Phone # [phone] [address] [place], [place] [address]

## 2016-07-14 NOTE — Assessment & Plan Note (Signed)
Last MI was about 11 years ago. No recurrent MI symptoms. He really is not active. He is however symptomatic with A. fib.

## 2016-07-14 NOTE — Assessment & Plan Note (Signed)
Seems to be relatively stable without any worsening symptoms. He unfortunately is just not able to really do much the way of any stimulating exercise due to his own personal medical conditions

## 2016-07-14 NOTE — Assessment & Plan Note (Signed)
No active anginal symptoms. Last ischemic evaluation in 2013. From now on, unless he has active anginal type symptoms, would not pursue additional management. Continues to be on combination of ARB and calcium channel blocker as opposed to beta blocker likely had better heart rate responsiveness to diltiazem.

## 2016-07-14 NOTE — Assessment & Plan Note (Signed)
Remains on stable dose of Lipitor. Monitored by PCP.

## 2016-08-31 ENCOUNTER — Other Ambulatory Visit: Payer: Self-pay | Admitting: Cardiology

## 2016-09-19 ENCOUNTER — Encounter: Payer: Self-pay | Admitting: Internal Medicine

## 2016-09-19 ENCOUNTER — Ambulatory Visit (INDEPENDENT_AMBULATORY_CARE_PROVIDER_SITE_OTHER): Payer: Medicare Other | Admitting: Internal Medicine

## 2016-09-19 VITALS — BP 130/70 | HR 76 | Ht 70.0 in | Wt 229.0 lb

## 2016-09-19 DIAGNOSIS — I481 Persistent atrial fibrillation: Secondary | ICD-10-CM

## 2016-09-19 DIAGNOSIS — I4819 Other persistent atrial fibrillation: Secondary | ICD-10-CM

## 2016-09-19 DIAGNOSIS — I2510 Atherosclerotic heart disease of native coronary artery without angina pectoris: Secondary | ICD-10-CM

## 2016-09-19 DIAGNOSIS — Z9861 Coronary angioplasty status: Secondary | ICD-10-CM

## 2016-09-19 NOTE — Patient Instructions (Signed)
Medication Instructions:  None Ordered   Labwork: None Ordered   Testing/Procedures: None Ordered   Follow-Up: Your physician recommends that you schedule a follow-up appointment in: 3 months with Dr. Rayann Heman  Any Other Special Instructions Will Be Listed Below (If Applicable).     If you need a refill on your cardiac medications before your next appointment, please call your pharmacy.

## 2016-09-19 NOTE — Progress Notes (Signed)
Electrophysiology Office Note   Date:  09/19/2016   ID:  Jerry [name], DOB 08-13-1929, MRN [id number]  PCP:  [name], MD  Cardiologist:  Dr Ellyn Hack Primary Electrophysiologist: [name], MD    Chief Complaint  Patient presents with  . Atrial Fibrillation     History of Present Illness: BURNEY CALZADILLA is a 83 y.o. male who presents today for electrophysiology follow-up.   The patient has persistent atrial fibrillation, OSA, and CAD.  The patient is elderly and frail.  He has occasional afib but is mostly unaware.  He reports that he frequently gets weak.  His legs hurt and "give out".  He has spinal stenosis and chronic back pain which also contribute.   These symptoms do not seem intermittent and do not seem to correlate with afib.   Past Medical History:  Diagnosis Date  . Anxiety   . CAD S/P percutaneous coronary angioplasty 1996, 2009, 01/2010   PTCA of L Cx 1996; BMS-RCA [facility]) 2009; Staged PCI RCA (70% ISR) --> 3.0 mm x 23 mm BMS, staged PCI Diag - 2.0 mm x 12 mm Mini Vision BMS (2.25 mm)  . Cataracts, bilateral    immature  . Chronic back pain    scoliosis--pt states unable to have surgery bc of age  . GERD (gastroesophageal reflux disease)    takes Nexium daily  . History of colon polyps   . Hyperlipidemia   . Hypertension   . Myocardial infarction [facility]) 1996, 2009   x 2;last time was about 8-45yrs ago   . Obesity (BMI 30.0-34.9)   . OSA (obstructive sleep apnea)   . Osteoarthritis of both knees   . Peripheral edema   . Persistent atrial fibrillation ([place])   . Prostate cancer ([place]) 2005   seed implant   Past Surgical History:  Procedure Laterality Date  . CARDIOVERSION N/A 06/12/2015   Procedure: CARDIOVERSION;  Surgeon: Sanda Klein, MD;  Location: [place] ENDOSCOPY;  Service: Cardiovascular;  Laterality: N/A;  . CARDIOVERSION N/A 08/02/2015   Procedure: CARDIOVERSION;  Surgeon: [name], MD;  Location: [facility];  Service: Cardiovascular;   Laterality: N/A;  . CHOLECYSTECTOMY    . COLONOSCOPY    . CORONARY ANGIOPLASTY WITH STENT PLACEMENT  1996, 2009, August 2011   PTCA of L Cx 1996; BMS-RCA [facility]) 2009; Staged PCI RCA (70% ISR) --> 3.0 mm x 23 mm BMS, staged PCI Diag - 2.0 mm x 12 mm Mini Vision BMS (2.25 mm)  . cyst removed  in college  . NEUROPLASTY / TRANSPOSITION MEDIAN NERVE AT CARPAL TUNNEL BILATERAL    . NM MYOVIEW LTD  November 2013   EF 53%; fixed mid inferior-inferolateral defect, infarct with no ischemia.  [name] NM MYOVIEW LTD  11/24/2010   ejection fraction 58%,left ventricle is normal size ,no evidence of inducible myocardial ischemia  . right knee arthrsoscopy  78yrs ago  . right trigger finger    . seeds placed into prostate   2005  . TEE WITHOUT CARDIOVERSION N/A 06/12/2015   Procedure: TRANSESOPHAGEAL ECHOCARDIOGRAM (TEE);  Surgeon: Sanda Klein, MD;  Location: [facility] ENDOSCOPY;  Service: Cardiovascular: EF 55-60%.No LA or LAA thrombus. Normal Peal PAP ~33 mmHg.  [name] TOTAL KNEE ARTHROPLASTY  05/09/2012   Procedure: TOTAL KNEE ARTHROPLASTY;  Surgeon: Kerin Salen, MD;  Location: [facility];  Service: Orthopedics;  Laterality: Right;  . TRANSTHORACIC ECHOCARDIOGRAM  06/2015   EF 60-65%. Mild LA dilation. Normal PA pressures: 32 mmHg. Aortic sclerosis but no stenosis.  [name]  wisdom teeth extraccted       Current Outpatient Prescriptions  Medication Sig Dispense Refill  . acetaminophen (TYLENOL) 500 MG tablet Take 1,000 mg by mouth daily as needed (pain).    . Cholecalciferol (VITAMIN D3) 1000 units CAPS Take 1 capsule by mouth daily.     . diazepam (VALIUM) 2 MG tablet Take 2 mg by mouth daily as needed for anxiety.     [name] diltiazem (CARTIA XT) 240 MG 24 hr capsule Take 1 capsule (240 mg total) by mouth daily. 90 capsule 3  . dofetilide (TIKOSYN) 500 MCG capsule Take 1 capsule (500 mcg total) by mouth 2 (two) times daily. 180 capsule 2  . ELIQUIS 5 MG TABS tablet TAKE 1 TABLET TWICE A DAY 180 tablet 1  . esomeprazole  (NEXIUM) 40 MG capsule Take 40 mg by mouth daily at 12 noon.    [name] Fe Bisgly-Vit C-Vit B12-FA (GENTLE IRON) 28-60-0.008-0.4 MG CAPS Take 1 capsule by mouth at bedtime.    . furosemide (LASIX) 20 MG tablet TAKE 1 TABLET DAILY AS NEEDED 90 tablet 3  . hydrocortisone cream 0.5 % Apply 1 application topically daily. Use as directed    . losartan (COZAAR) 100 MG tablet Take 100 mg by mouth daily.    . magnesium hydroxide (MILK OF MAGNESIA) 400 MG/5ML suspension Take 30 mLs by mouth daily as needed for mild constipation.    . magnesium oxide (MAG-OX) 400 MG tablet Take 1 tablet (400 mg total) by mouth 2 (two) times daily. 60 tablet 6  . mirabegron ER (MYRBETRIQ) 25 MG TB24 tablet Take 25 mg by mouth daily.     . nitroGLYCERIN (NITROSTAT) 0.4 MG SL tablet Place 0.4 mg under the tongue every 5 (five) minutes as needed for chest pain (MAX 3 TABLETS).     . phenol (CHLORASEPTIC) 1.4 % LIQD Use as directed 1 spray in the mouth or throat as needed for throat irritation / pain.    . Polyethylene Glycol 3350 (MIRALAX PO) Take 17 g by mouth daily as needed (Constipation).     . potassium chloride SA (K-DUR,KLOR-CON) 20 MEQ tablet Take 20 mEq by mouth daily.    . tamsulosin (FLOMAX) 0.4 MG CAPS capsule Take 0.4 mg by mouth at bedtime.      No current facility-administered medications for this visit.     Allergies:   Crab [shellfish allergy]; Hydrocodone; Adhesive [tape]; and Oxycodone   Social History:  The patient  reports that he quit smoking about 23 years ago. His smoking use included Pipe. He has never used smokeless tobacco. He reports that he drinks alcohol. He reports that he does not use drugs.   Family History: + HTN   ROS:  Please see the history of present illness.   All other systems are reviewed and negative.    PHYSICAL EXAM: VS:  BP 130/70   Pulse 76   Ht 5\' 10"  (1.778 m)   Wt 229 lb (103.9 kg)   SpO2 96%   BMI 32.86 kg/m  , BMI Body mass index is 32.86 kg/m. GEN: elderly,  overweight, walks slowly with a cane, in no acute distress  HEENT: normal  Neck: no JVD, carotid bruits, or masses Cardiac: iRRR  Respiratory:  clear to auscultation bilaterally, normal work of breathing GI: soft, nontender, nondistended, + BS MS: age appropriate atrophy Skin: warm and dry  Neuro:  Strength and sensation are intact Psych: euthymic mood, full affect  EKG:  EKG is ordered today. The  ekg ordered today shows afib, V rate 76 bpm, Qtc 454 msec   Recent Labs: 02/24/2016: TSH 2.00 04/07/2016: Magnesium 1.7 05/27/2016: B Natriuretic Peptide 70.7; BUN 23; Creatinine, Ser 1.11; Hemoglobin 14.9; Platelets 148; Potassium 3.8; Sodium 137    Lipid Panel     Component Value Date/Time   CHOL 155 07/29/2014 0943   TRIG 105 07/29/2014 0943   HDL 51 07/29/2014 0943   CHOLHDL 2.9 02/12/2014 1039   VLDL 21 02/12/2014 1039   LDLCALC 83 07/29/2014 0943     Wt Readings from Last 3 Encounters:  09/19/16 229 lb (103.9 kg)  07/14/16 227 lb 3.2 oz (103.1 kg)  04/06/16 229 lb (103.9 kg)     ASSESSMENT AND PLAN:  1.  Persistent atrial fibrillation In afib today, but seems to have mostly been sinus rhythm previously.  Continue current medical therapy.  If his afib progresses, would consider rate control strategies long term. Continue long term anticoagulation. Bmet, mg upon return  2. Weakness/ fatigue Stable  His wife would like for them to "down size" from their 4 bedroom house to a garden home or independent living.  She worries that he will die and that she will have to deal with their large home and its contents.   The patient is not ready to move.  They have not made any progress with this since I saw him last  Follow-up with EP PA-C in 3 months Follow-up with Dr Ellyn Hack as scheduled    Signed, [name], MD  09/19/2016 12:28 PM     [facility] [address] [place] [address] [phone] (office) [phone] (fax)

## 2016-10-13 ENCOUNTER — Other Ambulatory Visit: Payer: Self-pay | Admitting: Cardiology

## 2016-11-01 ENCOUNTER — Other Ambulatory Visit (HOSPITAL_COMMUNITY): Payer: Self-pay | Admitting: Nurse Practitioner

## 2016-11-24 ENCOUNTER — Other Ambulatory Visit: Payer: Self-pay | Admitting: Cardiology

## 2016-11-29 ENCOUNTER — Telehealth: Payer: Self-pay | Admitting: Internal Medicine

## 2016-11-29 NOTE — Telephone Encounter (Signed)
New message     [facility] Pre-operative Risk Assessment    Request for surgical clearance:  1. What type of surgery is being performed? extraction  2. When is this surgery scheduled? 01/04/2017  3. Are there any medications that need to be held prior to surgery and how long? Heart and blood thinner medication   Name of physician performing surgery? Posey Pronto  4. What is your office phone and fax number? [phone] fax [phone]  Tamera Reason 11/29/2016, 3:52 PM  _________________________________________________________________   (provider comments below)

## 2016-11-30 NOTE — Telephone Encounter (Signed)
Need more detailed information regarding procedure. Called Dr Mariel Sleet' office - they are performing only 1 extraction with future plans for implant. Per protocol, prefer that pt continue Eliquis for only 1 dental extraction since bleeding risk should be relatively low. Clearance faxed to Dr Mariel Sleet' office.

## 2016-12-04 NOTE — Progress Notes (Signed)
Cardiology Office Note Date:  12/05/2016  Patient ID:  Jerry [name], Jerry [name] [birth year], MRN [id number] PCP:  Aurea Graff.Marlou Sa, MD  Cardiologist:  [name] Electrophysiologist: Dr. Rayann Heman    Chief Complaint: planned f/u  History of Present Illness: Jerry [name] is a 83 y.o. male with history of CAD (last intervention BMS to Diag 2011, prior PTCA to [place], BMS to RCA 2009. Persistent AFib, HTN, HLD, OSA, CBP/spinal stenosis.  She was last seen by [name] in Feb at that time without new issues, mentioning chronic LE weakness and issues with his spinal stenosis.  He comes in to the office today to be seen for Dr. Rayann Heman, last seen by him in April, at that visit was in AF though by symptoms (lack of symptoms) thought to have been mostly in SR.  Planned for f/u, if persistent AF would consider rate control strategy w/ his a/c.  He reports feeling well outside of his knee problem/pain, walks with the aid of a cane. He denies any palpitations or sensation like he has had AF.  No CP or SOB, no dizziness, near syncope or syncope.  He denies any bleeding/signs of bleeding  AF Hx AAD: Tikosyn (current)  Past Medical History:  Diagnosis Date  . Anxiety   . CAD S/P percutaneous coronary angioplasty 1996, 2009, 01/2010   PTCA of L Cx 1996; BMS-RCA [facility]) 2009; Staged PCI RCA (70% ISR) --> 3.0 mm x 23 mm BMS, staged PCI Diag - 2.0 mm x 12 mm Mini Vision BMS (2.25 mm)  . Cataracts, bilateral    immature  . Chronic back pain    scoliosis--pt states unable to have surgery bc of age  . GERD (gastroesophageal reflux disease)    takes Nexium daily  . History of colon polyps   . Hyperlipidemia   . Hypertension   . Myocardial infarction [facility]) 1996, 2009   x 2;last time was about 8-37yrs ago   . Obesity (BMI 30.0-34.9)   . OSA (obstructive sleep apnea)   . Osteoarthritis of both knees   . Peripheral edema   . Persistent atrial fibrillation ([place])   . Prostate cancer ([place]) 2005     seed implant    Past Surgical History:  Procedure Laterality Date  . CARDIOVERSION N/A 06/12/2015   Procedure: CARDIOVERSION;  Surgeon: Sanda Klein, MD;  Location: [facility];  Service: Cardiovascular;  Laterality: N/A;  . CARDIOVERSION N/A 08/02/2015   Procedure: CARDIOVERSION;  Surgeon: [name], MD;  Location: [place];  Service: Cardiovascular;  Laterality: N/A;  . CHOLECYSTECTOMY    . COLONOSCOPY    . CORONARY ANGIOPLASTY WITH STENT PLACEMENT  1996, 2009, August 2011   PTCA of L Cx 1996; BMS-RCA [facility]) 2009; Staged PCI RCA (70% ISR) --> 3.0 mm x 23 mm BMS, staged PCI Diag - 2.0 mm x 12 mm Mini Vision BMS (2.25 mm)  . cyst removed  in college  . NEUROPLASTY / TRANSPOSITION MEDIAN NERVE AT CARPAL TUNNEL BILATERAL    . NM MYOVIEW LTD  November 2013   EF 53%; fixed mid inferior-inferolateral defect, infarct with no ischemia.  [name] NM MYOVIEW LTD  11/24/2010   ejection fraction 58%,left ventricle is normal size ,no evidence of inducible myocardial ischemia  . right knee arthrsoscopy  78yrs ago  . right trigger finger    . seeds placed into prostate   2005  . TEE WITHOUT CARDIOVERSION N/A 06/12/2015   Procedure: TRANSESOPHAGEAL ECHOCARDIOGRAM (TEE);  Surgeon: Sanda Klein, MD;  Location: [facility];  Service: Cardiovascular: EF 55-60%.No LA or LAA thrombus. Normal Peal PAP ~33 mmHg.  [name] TOTAL KNEE ARTHROPLASTY  05/09/2012   Procedure: TOTAL KNEE ARTHROPLASTY;  Surgeon: Kerin Salen, MD;  Location: [facility];  Service: Orthopedics;  Laterality: Right;  . TRANSTHORACIC ECHOCARDIOGRAM  06/2015   EF 60-65%. Mild LA dilation. Normal PA pressures: 32 mmHg. Aortic sclerosis but no stenosis.  [name] wisdom teeth extraccted      Current Outpatient Prescriptions  Medication Sig Dispense Refill  . acetaminophen (TYLENOL) 500 MG tablet Take 1,000 mg by mouth daily as needed (pain).    [name] atorvastatin (LIPITOR) 40 MG tablet TAKE 1 TABLET DAILY AT BEDTIME 90 tablet 3  . Cholecalciferol (VITAMIN D3)  1000 units CAPS Take 1 capsule by mouth daily.     . diazepam (VALIUM) 2 MG tablet Take 2 mg by mouth daily as needed for anxiety.     [name] diltiazem (CARTIA XT) 240 MG 24 hr capsule Take 1 capsule (240 mg total) by mouth daily. 90 capsule 3  . dofetilide (TIKOSYN) 500 MCG capsule Take 1 capsule (500 mcg total) by mouth 2 (two) times daily. 180 capsule 2  . ELIQUIS 5 MG TABS tablet TAKE 1 TABLET TWICE A DAY 180 tablet 0  . esomeprazole (NEXIUM) 40 MG capsule Take 40 mg by mouth daily at 12 noon.    [name] Fe Bisgly-Vit C-Vit B12-FA (GENTLE IRON) 28-60-0.008-0.4 MG CAPS Take 1 capsule by mouth at bedtime.    . furosemide (LASIX) 20 MG tablet TAKE 1 TABLET DAILY AS NEEDED 90 tablet 3  . hydrocortisone cream 0.5 % Apply 1 application topically daily. Use as directed    . losartan (COZAAR) 100 MG tablet Take 100 mg by mouth daily.    . magnesium hydroxide (MILK OF MAGNESIA) 400 MG/5ML suspension Take 30 mLs by mouth daily as needed for mild constipation.    . magnesium oxide (MAG-OX) 400 MG tablet Take 1 tablet (400 mg total) by mouth 2 (two) times daily. 60 tablet 6  . mirabegron ER (MYRBETRIQ) 25 MG TB24 tablet Take 25 mg by mouth daily.     . nitroGLYCERIN (NITROSTAT) 0.4 MG SL tablet Place 0.4 mg under the tongue every 5 (five) minutes as needed for chest pain (MAX 3 TABLETS).     . phenol (CHLORASEPTIC) 1.4 % LIQD Use as directed 1 spray in the mouth or throat as needed for throat irritation / pain.    . Polyethylene Glycol 3350 (MIRALAX PO) Take 17 g by mouth daily as needed (Constipation).     . potassium chloride SA (K-DUR,KLOR-CON) 20 MEQ tablet TAKE 1 TABLET DAILY 30 tablet 11  . tamsulosin (FLOMAX) 0.4 MG CAPS capsule Take 0.4 mg by mouth at bedtime.      No current facility-administered medications for this visit.     Allergies:   Crab [shellfish allergy]; Hydrocodone; Adhesive [tape]; and Oxycodone   Social History:  The patient  reports that he quit smoking about 23 years ago. His smoking  use included Pipe. He has never used smokeless tobacco. He reports that he drinks alcohol. He reports that he does not use drugs.   Family History:  The patient's family history is not on file.  ROS:  Please see the history of present illness.  All other systems are reviewed and otherwise negative.   PHYSICAL EXAM:  VS:  BP (!) 144/78   Pulse 74   Ht 5\' 10"  (1.778 m)   Wt 230  lb (104.3 kg)   BMI 33.00 kg/m  BMI: Body mass index is 33 kg/m. Well nourished, well developed, in no acute distress  HEENT: normocephalic, atraumatic  Neck: no JVD, carotid bruits or masses Cardiac:  RRR; extrasystoles apprciated, no significant murmurs, no rubs, or gallops Lungs: CTA b/l, no wheezing, rhonchi or rales  Abd: soft, nontender, obsese MS: no deformity or atrophy Ext: no edema  Skin: warm and dry, no rash Neuro:  No gross deficits appreciated Psych: euthymic mood, full affect   EKG:  Done today shows SR, atrial bigeminy, rhythm strip less frequent, 80bpm, PR 133ms, QRS 48ms, QT measured by myself is 431ms, QTc 468ms  06/21/16: TTE Study Conclusions - Left ventricle: The cavity size was normal. Systolic function was   normal. The estimated ejection fraction was in the range of 60%   to 65%. Wall motion was normal; there were no regional wall   motion abnormalities. - Left atrium: The atrium was mildly dilated. - Atrial septum: No defect or patent foramen ovale was identified. - Pulmonary arteries: PA peak pressure: 32 mm Hg (S).   Recent Labs: 02/24/2016: TSH 2.00 04/07/2016: Magnesium 1.7 05/27/2016: B Natriuretic Peptide 70.7; BUN 23; Creatinine, Ser 1.11; Hemoglobin 14.9; Platelets 148; Potassium 3.8; Sodium 137  No results found for requested labs within last 8760 hours.   CrCl cannot be calculated (Patient's most recent lab result is older than the maximum 21 days allowed.).   Wt Readings from Last 3 Encounters:  12/05/16 230 lb (104.3 kg)  09/19/16 229 lb (103.9 kg)  07/14/16  227 lb 3.2 oz (103.1 kg)     Other studies reviewed: Additional studies/records reviewed today include: summarized above  ASSESSMENT AND PLAN:  1. Persistent AFib     CHA2DS2Vasc is 4, on Xarelto     Tikosyn, QTc is stable     He is in SR today, will continue rhythm control strategy     BMET, CBC and mag level today  2. HTN     Looks OK, no changes today, advised to minimize sodium intake  3. CAD     No anginal complaints. Follows with [name]     No ASA w/Xarelto, on statin     C/w [name], he has an appointment in the next month or so   Disposition: F/u with Dr. Rayann Heman or myself in 6 months, sooner if needed.  Current medicines are reviewed at length with the patient today.  The patient did not have any concerns regarding medicines.  Haywood Lasso, PA-C 12/05/2016 12:54 PM     [place] [address] [phone] (office)  [phone] (fax)

## 2016-12-05 ENCOUNTER — Ambulatory Visit (INDEPENDENT_AMBULATORY_CARE_PROVIDER_SITE_OTHER): Payer: Medicare Other | Admitting: Physician Assistant

## 2016-12-05 VITALS — BP 144/78 | HR 74 | Ht 70.0 in | Wt 230.0 lb

## 2016-12-05 DIAGNOSIS — Z5181 Encounter for therapeutic drug level monitoring: Secondary | ICD-10-CM

## 2016-12-05 DIAGNOSIS — Z79899 Other long term (current) drug therapy: Secondary | ICD-10-CM | POA: Diagnosis not present

## 2016-12-05 DIAGNOSIS — I10 Essential (primary) hypertension: Secondary | ICD-10-CM | POA: Diagnosis not present

## 2016-12-05 DIAGNOSIS — I2510 Atherosclerotic heart disease of native coronary artery without angina pectoris: Secondary | ICD-10-CM | POA: Diagnosis not present

## 2016-12-05 DIAGNOSIS — Z9861 Coronary angioplasty status: Secondary | ICD-10-CM

## 2016-12-05 DIAGNOSIS — I4891 Unspecified atrial fibrillation: Secondary | ICD-10-CM | POA: Diagnosis not present

## 2016-12-05 DIAGNOSIS — I481 Persistent atrial fibrillation: Secondary | ICD-10-CM

## 2016-12-05 DIAGNOSIS — I4819 Other persistent atrial fibrillation: Secondary | ICD-10-CM

## 2016-12-05 NOTE — Patient Instructions (Signed)
Medication Instructions:   Your physician recommends that you continue on your current medications as directed. Please refer to the Current Medication list given to you today.   If you need a refill on your cardiac medications before your next appointment, please call your pharmacy.  Labwork: BMET CBC AND MAG TODAY   Testing/Procedures: NONE ORDERED  TODAY    Follow-Up: Your physician wants you to follow-up in:  IN  [place] will receive a reminder letter in the mail two months in advance. If you don't receive a letter, please call our office to schedule the follow-up appointment.      Any Other Special Instructions Will Be Listed Below (If Applicable).

## 2016-12-06 LAB — CBC
HEMATOCRIT: 42.8 % (ref 37.5–51.0)
Hemoglobin: 14.8 g/dL (ref 13.0–17.7)
MCH: 33.3 pg — AB (ref 26.6–33.0)
MCHC: 34.6 g/dL (ref 31.5–35.7)
MCV: 96 fL (ref 79–97)
PLATELETS: 211 10*3/uL (ref 150–379)
RBC: 4.44 x10E6/uL (ref 4.14–5.80)
RDW: 13.9 % (ref 12.3–15.4)
WBC: 9.2 10*3/uL (ref 3.4–10.8)

## 2016-12-06 LAB — BASIC METABOLIC PANEL
BUN / CREAT RATIO: 15 (ref 10–24)
BUN: 15 mg/dL (ref 8–27)
CO2: 24 mmol/L (ref 20–29)
CREATININE: 1.03 mg/dL (ref 0.76–1.27)
Calcium: 9.4 mg/dL (ref 8.6–10.2)
Chloride: 101 mmol/L (ref 96–106)
GFR calc Af Amer: 77 mL/min/{1.73_m2} (ref >59)
GFR, EST NON AFRICAN AMERICAN: 66 mL/min/{1.73_m2} (ref >59)
Glucose: 123 mg/dL — ABNORMAL HIGH (ref 65–99)
Potassium: 4.3 mmol/L (ref 3.5–5.2)
SODIUM: 140 mmol/L (ref 134–144)

## 2016-12-06 LAB — MAGNESIUM: Magnesium: 1.6 mg/dL (ref 1.6–2.3)

## 2016-12-16 DIAGNOSIS — C61 Malignant neoplasm of prostate: Secondary | ICD-10-CM | POA: Diagnosis not present

## 2016-12-21 DIAGNOSIS — R310 Gross hematuria: Secondary | ICD-10-CM | POA: Diagnosis not present

## 2016-12-21 DIAGNOSIS — Z8546 Personal history of malignant neoplasm of prostate: Secondary | ICD-10-CM | POA: Diagnosis not present

## 2016-12-21 DIAGNOSIS — N4341 Spermatocele of epididymis, single: Secondary | ICD-10-CM | POA: Diagnosis not present

## 2017-01-02 ENCOUNTER — Ambulatory Visit: Payer: Medicare Other | Admitting: Internal Medicine

## 2017-01-11 ENCOUNTER — Other Ambulatory Visit: Payer: Self-pay | Admitting: Cardiology

## 2017-02-22 ENCOUNTER — Other Ambulatory Visit: Payer: Self-pay | Admitting: Cardiology

## 2017-03-01 ENCOUNTER — Other Ambulatory Visit: Payer: Self-pay | Admitting: Cardiology

## 2017-03-02 ENCOUNTER — Other Ambulatory Visit: Payer: Self-pay | Admitting: Internal Medicine

## 2017-03-02 MED ORDER — POTASSIUM CHLORIDE CRYS ER 20 MEQ PO TBCR: 20.0000 meq | EXTENDED_RELEASE_TABLET | Freq: Every day | ORAL | 2 refills | Status: DC

## 2017-03-09 ENCOUNTER — Other Ambulatory Visit: Payer: Self-pay | Admitting: Cardiology

## 2017-03-18 ENCOUNTER — Other Ambulatory Visit: Payer: Self-pay | Admitting: Internal Medicine

## 2017-03-20 ENCOUNTER — Telehealth: Payer: Self-pay | Admitting: Cardiology

## 2017-03-20 NOTE — Telephone Encounter (Signed)
Called and talked to the wife who would like patient seen as soon as an appointment is available.  Patient having problems with low energy.  Wife said she will have him call me back.

## 2017-03-20 NOTE — Telephone Encounter (Signed)
This is Dr. Harding's pt. °

## 2017-03-20 NOTE — Telephone Encounter (Signed)
Rx(s) sent to pharmacy electronically.  

## 2017-03-23 ENCOUNTER — Ambulatory Visit (INDEPENDENT_AMBULATORY_CARE_PROVIDER_SITE_OTHER): Payer: Medicare Other | Admitting: Cardiology

## 2017-03-23 ENCOUNTER — Encounter: Payer: Self-pay | Admitting: Cardiology

## 2017-03-23 VITALS — BP 123/74 | HR 63 | Ht 70.0 in | Wt 231.0 lb

## 2017-03-23 DIAGNOSIS — I481 Persistent atrial fibrillation: Secondary | ICD-10-CM | POA: Diagnosis not present

## 2017-03-23 DIAGNOSIS — E785 Hyperlipidemia, unspecified: Secondary | ICD-10-CM | POA: Diagnosis not present

## 2017-03-23 DIAGNOSIS — Z9861 Coronary angioplasty status: Secondary | ICD-10-CM | POA: Diagnosis not present

## 2017-03-23 DIAGNOSIS — I2510 Atherosclerotic heart disease of native coronary artery without angina pectoris: Secondary | ICD-10-CM

## 2017-03-23 DIAGNOSIS — I10 Essential (primary) hypertension: Secondary | ICD-10-CM

## 2017-03-23 DIAGNOSIS — R0609 Other forms of dyspnea: Secondary | ICD-10-CM

## 2017-03-23 DIAGNOSIS — I2111 ST elevation (STEMI) myocardial infarction involving right coronary artery: Secondary | ICD-10-CM

## 2017-03-23 DIAGNOSIS — I2119 ST elevation (STEMI) myocardial infarction involving other coronary artery of inferior wall: Secondary | ICD-10-CM

## 2017-03-23 DIAGNOSIS — I4819 Other persistent atrial fibrillation: Secondary | ICD-10-CM

## 2017-03-23 MED ORDER — DILTIAZEM HCL ER COATED BEADS 120 MG PO CP24: 120.0000 mg | ORAL_CAPSULE | Freq: Every day | ORAL | 1 refills | Status: DC

## 2017-03-23 NOTE — Progress Notes (Signed)
PCP: Alroy Dust, L.Marlou Sa, MD  Clinic Note: Chief Complaint  Patient presents with  . Follow-up    6 month  . Atrial Fibrillation    FATIGUE  . Coronary Artery Disease    HPI: Jerry [name] is a 83 y.o. male with a PMH below who presents today for work in evaluation for dyspnea.  History of CAD-PCI as well as history of paroxysmal atrial fibrillation and begin PA-C/palpitations followed by Dr. Rayann Heman the clinic after being started on Tikosyn.[name] He also has chronic back pain with spinal stenosis and chronic lower extremity weakness. Sleep apnea on CPAP. At baseline he is elderly and frail. Cardiac History:  CAD/ previous MI --> s/p PTCA of LCx 1996; BMS-RCA [facility]) 2009; Staged PCI RCA (70% ISR) --> 3.0 mm x 23 mm BMS, staged PCI Diag - 2.0 mm x 12 mm Mini Vision BMS (2.25 mm) in 2011,  He had a Myoview in 04/2012 that showed a fixed mid inferior-inferolateral defect/ infarct with no ischemia. EF was 53%.  Echocardiogram 09/2012 also showed normal EF of 55-60% w/ G2DD, aortic sclerosis w/o stenosis and mild PAH. - Most recent Echo in January 2016 now shows normal EF 60-65% with normal PA pressures.  In January to February 2017 he was diagnosed with atrial fibrillation. He underwent cardioversion. He was then started on Tikosyn and finally he was started on Tikosyn (by Dr. Rayann Heman) and underwent cardioversion again.   [name] was last seen on July 2 by Georgeanna Harrison -- was doing fairly well. Maintaining sinus rhythm. Thought was that the word to be more in atrial fibrillation despite being on Tikosyn, the rate control as well as rhythm control. I last saw him in February - he was doing relatively well at that time.  Recent Hospitalizations: None  Studies Personally Reviewed - (if available, images/films reviewed: From Epic Chart or Care Everywhere)  No new studies  Interval History: [name] presents today a little bit out of sorts because he got lost getting here. He seems to  have aged quite a bit since I last saw him. Very slow deliberate gait walking in. Using a cane now with a slow steady gait. He tells me that really can only go about 2 blocks without getting short of breath and then he has to rest. He has a standard catches breath and then continue walking.  He denies any chest tightness or pressure with rest or exertion and denies any resting dyspnea. He sleeps in a lazy a recliner which he has been doing for years as much for comfort is anything else. So therefore it's hard to tell if he has any PND or orthopnea. He does have lower extremity edema that is pretty well-controlled. He does still note some fatigue, but seems to have improved since stopping the beta blocker. Since the beta blocker he has not noted any sensation of rapid irregular heartbeats or palpitations. Though he may get dizzy with standing up, he denies any syncope/near-syncope or TIA since amaurosis fugax symptoms.  No melena, hematochezia, hematuria, or epstaxis. No claudication.  ROS: A comprehensive was performed. Review of Systems  Constitutional: Positive for malaise/fatigue. Negative for weight loss.  HENT: Negative for congestion and nosebleeds.   Eyes:       Vision since being improving postop from cataract surgery.  Respiratory: Positive for shortness of breath (On exertion, and with bending over.).   Cardiovascular: Positive for leg swelling.       Per history of present illness  Gastrointestinal: Negative for blood in stool, heartburn and melena.  Genitourinary: Negative for hematuria.       He noted having a blood vessel on his scrotal sac that ruptured last week and he had some bruising, but seems to have resolved.  Musculoskeletal: Positive for back pain (With radiculopathy and leg weakness from spinal stenosis.) and joint pain (Indication having neuropathy, he is knees hurt).  Neurological: Negative for tremors and focal weakness (Weakness involves both legs not unilateral).         Poor balance with weakness in his legs for spinal stenosis now uses a cane with a slow steady gait.  Psychiatric/Behavioral: Negative for depression and memory loss. The patient is not nervous/anxious and does not have insomnia.   All other systems reviewed and are negative.  I have reviewed and (if needed) personally updated the patient's problem list, medications, allergies, past medical and surgical history, social and family history.   Past Medical History:  Diagnosis Date  . Anxiety   . CAD S/P percutaneous coronary angioplasty 1996, 2009, 01/2010   PTCA of L Cx 1996; BMS-RCA [facility]) 2009; Staged PCI RCA (70% ISR) --> 3.0 mm x 23 mm BMS, staged PCI Diag - 2.0 mm x 12 mm Mini Vision BMS (2.25 mm)  . Cataracts, bilateral    immature  . Chronic back pain    scoliosis--pt states unable to have surgery bc of age  . GERD (gastroesophageal reflux disease)    takes Nexium daily  . History of colon polyps   . Hyperlipidemia   . Hypertension   . Myocardial infarction [facility]) 1996, 2009   x 2;last time was about 8-79yrs ago   . Obesity (BMI 30.0-34.9)   . OSA (obstructive sleep apnea)   . Osteoarthritis of both knees   . Peripheral edema   . Persistent atrial fibrillation ([place])   . Prostate cancer ([place]) 2005   seed implant    Past Surgical History:  Procedure Laterality Date  . CARDIOVERSION N/A 06/12/2015   Procedure: CARDIOVERSION;  Surgeon: Sanda Klein, MD;  Location: [facility];  Service: Cardiovascular;  Laterality: N/A;  . CARDIOVERSION N/A 08/02/2015   Procedure: CARDIOVERSION;  Surgeon: [name], MD;  Location: [place];  Service: Cardiovascular;  Laterality: N/A;  . CHOLECYSTECTOMY    . COLONOSCOPY    . CORONARY ANGIOPLASTY WITH STENT PLACEMENT  1996, 2009, August 2011   PTCA of L Cx 1996; BMS-RCA [facility]) 2009; Staged PCI RCA (70% ISR) --> 3.0 mm x 23 mm BMS, staged PCI Diag - 2.0 mm x 12 mm Mini Vision BMS (2.25 mm)  . cyst removed  in college  . NEUROPLASTY /  TRANSPOSITION MEDIAN NERVE AT CARPAL TUNNEL BILATERAL    . NM MYOVIEW LTD  November 2013   EF 53%; fixed mid inferior-inferolateral defect, infarct with no ischemia.  [name] NM MYOVIEW LTD  11/24/2010   ejection fraction 58%,left ventricle is normal size ,no evidence of inducible myocardial ischemia  . right knee arthrsoscopy  90yrs ago  . right trigger finger    . seeds placed into prostate   2005  . TEE WITHOUT CARDIOVERSION N/A 06/12/2015   Procedure: TRANSESOPHAGEAL ECHOCARDIOGRAM (TEE);  Surgeon: Sanda Klein, MD;  Location: [facility];  Service: Cardiovascular: EF 55-60%.No LA or LAA thrombus. Normal Peal PAP ~33 mmHg.  [name] TOTAL KNEE ARTHROPLASTY  05/09/2012   Procedure: TOTAL KNEE ARTHROPLASTY;  Surgeon: Kerin Salen, MD;  Location: [facility];  Service: Orthopedics;  Laterality: Right;  . TRANSTHORACIC  ECHOCARDIOGRAM  06/2015   EF 60-65%. Mild LA dilation. Normal PA pressures: 32 mmHg. Aortic sclerosis but no stenosis.  [name] wisdom teeth extraccted      Current Meds  Medication Sig  . [DISCONTINUED] diltiazem (CARTIA XT) 240 MG 24 hr capsule Take 240 mg by mouth. Take as needed for rapid heart rate/a-fib    Allergies  Allergen Reactions  . Crab [Shellfish Allergy] Rash  . Hydrocodone Hives  . Adhesive [Tape] Rash  . Oxycodone Rash    Social History   Social History  . Marital status: Married    Spouse name: N/A  . Number of children: N/A  . Years of education: N/A   Occupational History  . Retired    Social History Main Topics  . Smoking status: Former Smoker    Types: Pipe    Quit date: 03/12/1993  . Smokeless tobacco: Never Used     Comment: quit smoking pipe about 16yrs ago  . Alcohol use Yes     Comment: glass of wine daily  . Drug use: No  . Sexual activity: Not Currently   Other Topics Concern  . None   Social History Narrative   He is a married father of 2, grandfather of 2. He exercises at least 3 to 4 times a week but does something 6 to 7 days a week. He  does not smoke. Drinks occasional alcohol.     family history includes Cancer (age of onset: 80) in his mother; Heart attack (age of onset: 29) in his father.  Wt Readings from Last 3 Encounters:  03/23/17 231 lb (104.8 kg)  12/05/16 230 lb (104.3 kg)  09/19/16 229 lb (103.9 kg)    PHYSICAL EXAM BP 123/74   Pulse 63   Ht 5\' 10"  (1.778 m)   Wt 231 lb (104.8 kg)   BMI 33.15 kg/m  Physical Exam  Constitutional: He appears well-developed and well-nourished. No distress.  Chronically ill elderly gentleman who is obese, mildly disheveled. Appears his stated age. Quite worn out walking in the clinic room  HENT:  Head: Normocephalic and atraumatic.  Neck: No hepatojugular reflux and no JVD (Difficult to assess with body habitus) present. Carotid bruit is not present.  Cardiovascular: Normal rate and regular rhythm.   Occasional extrasystoles are present. PMI is not displaced.  Exam reveals decreased pulses (Decreased pedal pulses bilaterally). Exam reveals no gallop and no friction rub.   No murmur heard. Pulmonary/Chest: Breath sounds normal. No respiratory distress. He has no wheezes. He has no rales. He exhibits no tenderness.  Once he was settled down after walking in to the clinic room, his breathing was more stable and nonlabored.  Abdominal: Soft. Bowel sounds are normal. He exhibits no distension. There is no tenderness. There is no rebound.  Truncal obesity  Musculoskeletal: Normal range of motion. He exhibits edema (Trace to 1+ bilateral).  Slow, steady gait shuffling with cane  Neurological: He is alert.  Skin: Skin is warm and dry. No rash noted. No erythema.  Psychiatric: He has a normal mood and affect. His behavior is normal. Judgment and thought content normal.    Adult ECG Report  Rate: 63 ;  Rhythm: normal sinus rhythm, premature atrial contractions (PAC) and Otherwise normal axis, intervals and durations. ;   Narrative Interpretation: Stable EKG.   Other studies  Reviewed: Additional studies/ records that were reviewed today include:  Recent Labs:      Chemistry      Component Value Date/Time  NA 140 12/05/2016 1208   K 4.3 12/05/2016 1208   CL 101 12/05/2016 1208   CO2 24 12/05/2016 1208   BUN 15 12/05/2016 1208   CREATININE 1.03 12/05/2016 1208   CREATININE 1.14 (H) 04/07/2016 1355   GLU 127 08/10/2015      Component Value Date/Time   CALCIUM 9.4 12/05/2016 1208   ALKPHOS 71 08/10/2015   AST 11 (A) 08/10/2015   ALT 9 (A) 08/10/2015   BILITOT 0.7 07/16/2015 1000     Lab Results  Component Value Date   WBC 9.2 12/05/2016   HGB 14.8 12/05/2016   HCT 42.8 12/05/2016   MCV 96 12/05/2016   PLT 211 12/05/2016    ASSESSMENT / PLAN: Problem List Items Addressed This Visit    CAD S/P percutaneous coronary angioplasty (Chronic)    Distant history of circumflex PTCA in 1996 as well as bare-metal stent to the RCA in 2009 as well as the diagonal branch.  No active anginal symptoms. Nonischemic Myoview in 2012. In the absence of any active anginal symptoms, I would probably avoid any further screening testing. Beta blocker discontinued due to fatigue. I'm actually Micardis diltiazem dose in half for that as well. He is otherwise on statin and ARB. Not on aspirin because of ELIQUIS.       Relevant Medications   diltiazem (CARDIZEM CD) 120 MG 24 hr capsule   Essential hypertension (Chronic)    There has been issues with high and low blood pressures. At this point, I would like to try to give him some energy back and will back off on his diltiazem some to see if this better heart rate responsiveness. He is on stable dose of losartan which we could easily convert to a more potent ARB for better blood pressure control.  Continue to monitor with reduced dose of calcium channel blocker      Relevant Medications   diltiazem (CARDIZEM CD) 120 MG 24 hr capsule   Exertional dyspnea and fatigue (Chronic)    Did better off beta blocker. He still  having some fatigue. Then a trial allow little bit permissive hypertension and maybe some better blood pressure/heart rate response to exercise by backing off on diltiazem dose to 120 mg daily. Continue additional dose for rapid heartbeats and then would go back to the 240mg  PRN rapid A. fib. [name]      Hyperlipidemia with target LDL less than 70 (Chronic)    On stable dose of atorvastatin with labs monitored by PCP.      Relevant Medications   diltiazem (CARDIZEM CD) 120 MG 24 hr capsule   Myocardial infarction; history of (Chronic)    No recurrent MI symptoms since 2011 when he has last angioplasty. Nonischemic Myoview in the interim.  Preserved EF with no further anginal symptoms and no real heart failure symptoms on current when necessary Lasix.      Relevant Medications   diltiazem (CARDIZEM CD) 120 MG 24 hr capsule   Persistent atrial fibrillation ([place]):  CHA2DS2-VASc Score 4 - Primary (Chronic)    Persistent paroxysmal A. fib seemingly maintaining sinus rhythm with Tikosyn. Backup rate control with diltiazem. He is having some symptoms of exercise intolerance and therefore I will back off on the diltiazem to 120 mg daily and see how he does. He can take the higher dose tablet as needed for rapid heartbeats. Doing okay on ELIQUIS for anticoagulation. No bleeding issues.      Relevant Medications   diltiazem (CARDIZEM CD) 120  MG 24 hr capsule   Other Relevant Orders   EKG 12-Lead (Completed)      Current medicines are reviewed at length with the patient today. (+/- concerns) N/A The following changes have been made: N/A  Patient Instructions  Your physician has recommended you make the following change in your medication:  -- DECREASE diltiazem  to 120mg  daily -- TAKE diltiazem 240mg  as needed for rapid heart rate/atrial fibrillation -- TAKE lipitor at night  Your physician wants you to follow-up in: 6 months with [name]. You will receive a reminder letter in the mail  two months in advance. If you don't receive a letter, please call our office to schedule the follow-up appointment.    Studies Ordered:   Orders Placed This Encounter  Procedures  . EKG 12-Lead      Glenetta Hew, M.D., M.S. Interventional Cardiologist   Pager # [phone] Phone # [phone] [address]. [place], [place] [address]

## 2017-03-23 NOTE — Patient Instructions (Addendum)
Your physician has recommended you make the following change in your medication:  -- DECREASE diltiazem  to 120mg  daily -- TAKE diltiazem 240mg  as needed for rapid heart rate/atrial fibrillation -- TAKE lipitor at night  Your physician wants you to follow-up in: 6 months with [name]. You will receive a reminder letter in the mail two months in advance. If you don't receive a letter, please call our office to schedule the follow-up appointment.

## 2017-03-26 ENCOUNTER — Other Ambulatory Visit: Payer: Self-pay | Admitting: Cardiology

## 2017-03-26 ENCOUNTER — Encounter: Payer: Self-pay | Admitting: Cardiology

## 2017-03-26 NOTE — Assessment & Plan Note (Signed)
There has been issues with high and low blood pressures. At this point, I would like to try to give him some energy back and will back off on his diltiazem some to see if this better heart rate responsiveness. He is on stable dose of losartan which we could easily convert to a more potent ARB for better blood pressure control.  Continue to monitor with reduced dose of calcium channel blocker

## 2017-03-26 NOTE — Assessment & Plan Note (Addendum)
Distant history of circumflex PTCA in 1996 as well as bare-metal stent to the RCA in 2009 as well as the diagonal branch.  No active anginal symptoms. Nonischemic Myoview in 2012. In the absence of any active anginal symptoms, I would probably avoid any further screening testing. Beta blocker discontinued due to fatigue. I'm actually Micardis diltiazem dose in half for that as well. He is otherwise on statin and ARB. Not on aspirin because of ELIQUIS.

## 2017-03-26 NOTE — Assessment & Plan Note (Signed)
No recurrent MI symptoms since 2011 when he has last angioplasty. Nonischemic Myoview in the interim.  Preserved EF with no further anginal symptoms and no real heart failure symptoms on current when necessary Lasix.

## 2017-03-26 NOTE — Assessment & Plan Note (Signed)
On stable dose of atorvastatin with labs monitored by PCP.

## 2017-03-26 NOTE — Assessment & Plan Note (Signed)
Did better off beta blocker. He still having some fatigue. Then a trial allow little bit permissive hypertension and maybe some better blood pressure/heart rate response to exercise by backing off on diltiazem dose to 120 mg daily. Continue additional dose for rapid heartbeats and then would go back to the 240mg  PRN rapid A. fib. Jerry [name]

## 2017-03-26 NOTE — Assessment & Plan Note (Signed)
Persistent paroxysmal A. fib seemingly maintaining sinus rhythm with Tikosyn. Backup rate control with diltiazem. He is having some symptoms of exercise intolerance and therefore I will back off on the diltiazem to 120 mg daily and see how he does. He can take the higher dose tablet as needed for rapid heartbeats. Doing okay on ELIQUIS for anticoagulation. No bleeding issues.

## 2017-04-11 ENCOUNTER — Other Ambulatory Visit: Payer: Self-pay | Admitting: Physician Assistant

## 2017-04-13 DIAGNOSIS — E669 Obesity, unspecified: Secondary | ICD-10-CM | POA: Diagnosis not present

## 2017-04-13 DIAGNOSIS — Z0000 Encounter for general adult medical examination without abnormal findings: Secondary | ICD-10-CM | POA: Diagnosis not present

## 2017-04-13 DIAGNOSIS — Z23 Encounter for immunization: Secondary | ICD-10-CM | POA: Diagnosis not present

## 2017-04-13 DIAGNOSIS — I10 Essential (primary) hypertension: Secondary | ICD-10-CM | POA: Diagnosis not present

## 2017-04-13 DIAGNOSIS — I4891 Unspecified atrial fibrillation: Secondary | ICD-10-CM | POA: Diagnosis not present

## 2017-04-13 DIAGNOSIS — D489 Neoplasm of uncertain behavior, unspecified: Secondary | ICD-10-CM | POA: Diagnosis not present

## 2017-04-13 DIAGNOSIS — R2689 Other abnormalities of gait and mobility: Secondary | ICD-10-CM | POA: Diagnosis not present

## 2017-04-13 DIAGNOSIS — K219 Gastro-esophageal reflux disease without esophagitis: Secondary | ICD-10-CM | POA: Diagnosis not present

## 2017-05-09 DIAGNOSIS — H40033 Anatomical narrow angle, bilateral: Secondary | ICD-10-CM | POA: Diagnosis not present

## 2017-05-09 DIAGNOSIS — Z961 Presence of intraocular lens: Secondary | ICD-10-CM | POA: Diagnosis not present

## 2017-05-09 DIAGNOSIS — H04123 Dry eye syndrome of bilateral lacrimal glands: Secondary | ICD-10-CM | POA: Diagnosis not present

## 2017-05-12 DIAGNOSIS — D489 Neoplasm of uncertain behavior, unspecified: Secondary | ICD-10-CM | POA: Diagnosis not present

## 2017-05-12 DIAGNOSIS — D045 Carcinoma in situ of skin of trunk: Secondary | ICD-10-CM | POA: Diagnosis not present

## 2017-05-12 DIAGNOSIS — R2689 Other abnormalities of gait and mobility: Secondary | ICD-10-CM | POA: Diagnosis not present

## 2017-05-18 ENCOUNTER — Ambulatory Visit: Payer: Medicare Other | Admitting: Physical Therapy

## 2017-05-23 ENCOUNTER — Ambulatory Visit: Payer: Medicare Other | Attending: Family Medicine | Admitting: Physical Therapy

## 2017-05-23 ENCOUNTER — Encounter: Payer: Self-pay | Admitting: Physical Therapy

## 2017-05-23 DIAGNOSIS — M6281 Muscle weakness (generalized): Secondary | ICD-10-CM | POA: Diagnosis not present

## 2017-05-23 DIAGNOSIS — R2689 Other abnormalities of gait and mobility: Secondary | ICD-10-CM

## 2017-05-23 DIAGNOSIS — R2681 Unsteadiness on feet: Secondary | ICD-10-CM | POA: Insufficient documentation

## 2017-05-23 NOTE — Therapy (Signed)
[facility] [address] [place], [place], [address] Phone: [phone]   Fax:  [phone]  Physical Therapy Evaluation  Patient Details  Name: Jerry [name] MRN: [id number] Date of Birth: [birth year] Referring Provider: [name], MD    Encounter Date: 05/23/2017  PT End of Session - 05/23/17 1252    Visit Number  1    Number of Visits  17    Date for PT Re-Evaluation  07/18/17    Authorization Type  Medicare A and B    Authorization Time Period  1218/19 to 07/18/17    Authorization - Visit Number  1    Authorization - Number of Visits  10    PT Start Time  1209    PT Stop Time  1249    PT Time Calculation (min)  40 min    Activity Tolerance  No increased pain;Patient limited by fatigue    Behavior During Therapy  [facility] for tasks assessed/performed       Past Medical History:  Diagnosis Date  . Anxiety   . CAD S/P percutaneous coronary angioplasty 1996, 2009, 01/2010   PTCA of L Cx 1996; BMS-RCA [facility]) 2009; Staged PCI RCA (70% ISR) --> 3.0 mm x 23 mm BMS, staged PCI Diag - 2.0 mm x 12 mm Mini Vision BMS (2.25 mm)  . Cataracts, bilateral    immature  . Chronic back pain    scoliosis--pt states unable to have surgery bc of age  . GERD (gastroesophageal reflux disease)    takes Nexium daily  . History of colon polyps   . Hyperlipidemia   . Hypertension   . Myocardial infarction [facility]) 1996, 2009   x 2;last time was about 8-60yrs ago   . Obesity (BMI 30.0-34.9)   . OSA (obstructive sleep apnea)   . Osteoarthritis of both knees   . Peripheral edema   . Persistent atrial fibrillation ([place])   . Prostate cancer ([place]) 2005   seed implant    Past Surgical History:  Procedure Laterality Date  . CARDIOVERSION N/A 06/12/2015   Procedure: CARDIOVERSION;  Surgeon: Sanda Klein, MD;  Location: [facility];  Service: Cardiovascular;  Laterality: N/A;  . CARDIOVERSION N/A 08/02/2015   Procedure:  CARDIOVERSION;  Surgeon: [name], MD;  Location: [facility];  Service: Cardiovascular;  Laterality: N/A;  . CHOLECYSTECTOMY    . COLONOSCOPY    . CORONARY ANGIOPLASTY WITH STENT PLACEMENT  1996, 2009, August 2011   PTCA of L Cx 1996; BMS-RCA [facility]) 2009; Staged PCI RCA (70% ISR) --> 3.0 mm x 23 mm BMS, staged PCI Diag - 2.0 mm x 12 mm Mini Vision BMS (2.25 mm)  . cyst removed  in college  . NEUROPLASTY / TRANSPOSITION MEDIAN NERVE AT CARPAL TUNNEL BILATERAL    . NM MYOVIEW LTD  November 2013   EF 53%; fixed mid inferior-inferolateral defect, infarct with no ischemia.  [name] NM MYOVIEW LTD  11/24/2010   ejection fraction 58%,left ventricle is normal size ,no evidence of inducible myocardial ischemia  . right knee arthrsoscopy  66yrs ago  . right trigger finger    . seeds placed into prostate   2005  . TEE WITHOUT CARDIOVERSION N/A 06/12/2015   Procedure: TRANSESOPHAGEAL ECHOCARDIOGRAM (TEE);  Surgeon: Sanda Klein, MD;  Location: [facility];  Service: Cardiovascular: EF 55-60%.No LA or LAA thrombus. Normal Peal PAP ~33 mmHg.  [name] TOTAL KNEE ARTHROPLASTY  05/09/2012   Procedure: TOTAL KNEE ARTHROPLASTY;  Surgeon: Kathalene Frames  Mayer Camel, MD;  Location: [facility];  Service: Orthopedics;  Laterality: Right;  . TRANSTHORACIC ECHOCARDIOGRAM  06/2015   EF 60-65%. Mild LA dilation. Normal PA pressures: 32 mmHg. Aortic sclerosis but no stenosis.  [name] wisdom teeth extraccted      There were no vitals filed for this visit.   Subjective Assessment - 05/23/17 1215    Subjective  Pt reports that he went to see his PCP who was referred to therapy for general strengthening and balance work. He has not had any falls recently, but does feel that his balance is a large issue. He currently is using his SPC, but recently got a walker that he will use every now and then.     Pertinent History  persistent A-fib, history of MI, HLD, exertional dyspnea and fatigue, HTN    Patient Stated Goals  improve strength and balance      Currently in Pain?  No/denies         [facility] PT Assessment - 05/23/17 0001      Assessment   Medical Diagnosis  Balance Disorder     Referring Provider  [name], MD     Next MD Visit  unsure     Prior Therapy  last year OPPT      Precautions   Precautions  None      Restrictions   Weight Bearing Restrictions  No      Balance Screen   Has the patient fallen in the past 6 months  No    Has the patient had a decrease in activity level because of a fear of falling?   Yes    Is the patient reluctant to leave their home because of a fear of falling?   No      Home Film/video editor residence    Living Arrangements  Spouse/significant other    Additional Comments  1 flight of stairs to get to his "man cave" and bathroom       Prior Function   Level of Independence  Independent      Cognition   Overall Cognitive Status  Within Functional Limits for tasks assessed      Sensation   Additional Comments  pt denies any numbness/tingling in LEs       Posture/Postural Control   Posture Comments  significant forward head/rounded shoulders; excessive thoracic kyphosis       ROM / Strength   AROM / PROM / Strength  Strength      Strength   Strength Assessment Site  Knee;Hip;Ankle    Right/Left Hip  Right;Left    Right Hip Flexion  4+/5    Right Hip Extension  3/5 limited by ROM    Right Hip ABduction  4/5 seated     Left Hip Flexion  4+/5    Left Hip Extension  3/5 limited by ROM    Left Hip ABduction  4/5 seated     Right/Left Knee  Right;Left    Right Knee Flexion  4/5 heavy gastroc recruitment; seated     Right Knee Extension  4/5    Left Knee Flexion  4/5 heavy gastroc recruitment; seated     Left Knee Extension  5/5    Right/Left Ankle  Right;Left    Right Ankle Dorsiflexion  5/5    Left Ankle Dorsiflexion  5/5      Flexibility   Soft Tissue Assessment /Muscle Length  -- hip extension limited 50%, unable to  attain neutral        Transfers   Five time sit to stand comments   16.6 sec, UE support       Ambulation/Gait   Pre-Gait Activities  Ascend and descend steps with 1 handrail and SPC, using step to pattern; Pt placing SPC 2 steps ahead of himself when descending       Standardized Balance Assessment   Standardized Balance Assessment  Berg Balance Test;Timed Up and Go Test      Berg Balance Test   Sit to Stand  Able to stand  independently using hands    Standing Unsupported  Able to stand safely 2 minutes    Sitting with Back Unsupported but Feet Supported on Floor or Stool  Able to sit safely and securely 2 minutes    Stand to Sit  Controls descent by using hands    Transfers  Able to transfer safely, definite need of hands    Standing Unsupported with Eyes Closed  Able to stand 10 seconds with supervision    Standing Ubsupported with Feet Together  Needs help to attain position but able to stand for 30 seconds with feet together    From Standing, Reach Forward with Outstretched Arm  Reaches forward but needs supervision    From Standing Position, Pick up Object from Floor  Unable to pick up and needs supervision    From Standing Position, Turn to Look Behind Over each Shoulder  Needs supervision when turning    Turn 360 Degrees  Needs close supervision or verbal cueing    Standing Unsupported, Alternately Place Feet on Step/Stool  Needs assistance to keep from falling or unable to try    Standing Unsupported, One Foot in Front  Able to take small step independently and hold 30 seconds    Standing on One Leg  Unable to try or needs assist to prevent fall    Total Score  27    Berg comment:  Pt requiring rest breaks frequently during session       Timed Up and Go Test   TUG Comments  13.3 sec with SPC and UE support when completing sit to<>stand              Objective measurements completed on examination: See above findings.              PT Education - 05/23/17 1245    Education  provided  Yes    Education Details  eval findings/POC;     Person(s) Educated  Patient    Methods  Explanation    Comprehension  Verbalized understanding       PT Short Term Goals - 05/23/17 1309      PT SHORT TERM GOAL #1   Title  Pt will demo consistency and independence with his HEP to improve safety with functional tasks.     Time  4    Period  Weeks    Status  New    Target Date  06/20/17      PT SHORT TERM GOAL #2   Title  The patient will have improved trunk and LE strength evident by his ability stand 3 min during a session without need for a rest break.     Time  4    Period  Weeks    Status  New      PT SHORT TERM GOAL #3   Title  Pt will demo improved walking tolerance up to atleast 5 min  without the need for rest breaks, to increase his independence with going to the grocery store or attending church.     Time  4    Period  Weeks    Status  New      PT SHORT TERM GOAL #4   Title  Pt will demo improved hip flexor muscle length to atleast 0 deg, to improve his mechanics with ambulation and decrease energy expenditure.     Time  4    Period  Weeks    Status  New      PT SHORT TERM GOAL #5   Title  Pt will demo improved functional strength evident by his ability to complete sit to stand atleast 5 consecutive reps without the need for UE support.     Time  4    Period  Weeks    Status  New        PT Long Term Goals - 05/23/17 1315      PT LONG TERM GOAL #1   Title  The patient will be independent with safe self progression of HEP to improved walking distance and further conditioning at discharge.    Time  8    Period  Weeks    Status  New    Target Date  07/18/17      PT LONG TERM GOAL #2   Title  Pt will demo improved LE strenth and steadiness with stairs, evident by his ability to ascend/descend clinic steps with reciprocal pattern and without assistance, 2/3 trials.     Time  8    Period  Weeks    Status  New      PT LONG TERM GOAL #3   Title  Pt  will complete 5x sit to stand in less than 14 sec with minimal UE support, to reflect an improvement in his functional strength and power.    Time  8    Period  Weeks    Status  New      PT LONG TERM GOAL #4   Title  Pt will demo improved berg balance score of atleast 8 points, to reflect improvements in balance and decrease risk of falling.     Time  8    Period  Weeks    Status  New      PT LONG TERM GOAL #5   Title  Pt will demo improved mobility and balance, evident by her ability to complete the TUG in atleast 13 sec with LRAD.     Time  8    Period  Weeks    Status  New             Plan - 05/23/17 1259    Clinical Impression Statement  Pt is a pleasant 83 y.o M referred to OPPT by his physician to increase his strength and balance with daily activity. Pt primarily uses a SPC for ambulation, and demonstrates significant forward head/kyphotic posturing upon arrival. He has received PT in the past for general deconditioning and presents today with significant limitations in LE strength, poor balance evident by his score of 27 on the Berg balance test, and decreased performance on the 5x sit to stand test. Pt required frequent rest breaks throughout the evaluation, and the therapist encouraged him to begin a walking program at home for several minutes a day. He would benefit from skilled PT to address his limitations in strength, balance, mobility and improve his safety with activity.  History and Personal Factors relevant to plan of care:  extensive cardiac history, exertional dyspnea    Clinical Presentation  Stable    Clinical Presentation due to:  no history of falls     Clinical Decision Making  Moderate    Rehab Potential  Good    Clinical Impairments Affecting Rehab Potential  (+) pt seems highly organized, (-) seldomly active at home    PT Frequency  2x / week    PT Duration  8 weeks    PT Treatment/Interventions  ADLs/Self Care Home Management;Moist  Heat;Cryotherapy;Balance training;Therapeutic exercise;Therapeutic activities;Functional mobility training;Stair training;Gait training;Neuromuscular re-education;Patient/family education;Manual techniques;Passive range of motion;Dry needling    PT Next Visit Plan  Provide HEP and follow up on walking program; progression of LE strength, balance activity    PT Home Exercise Plan  needs next session: LE strength and flexibility    Consulted and Agree with Plan of Care  Patient       Patient will benefit from skilled therapeutic intervention in order to improve the following deficits and impairments:  Abnormal gait, Decreased activity tolerance, Decreased balance, Decreased safety awareness, Difficulty walking, Impaired flexibility, Obesity, Hypomobility, Decreased strength, Decreased range of motion, Decreased endurance, Decreased mobility, Postural dysfunction, Improper body mechanics  Visit Diagnosis: Muscle weakness (generalized)  Unsteadiness on feet  Other abnormalities of gait and mobility  G-Codes - 06/20/2017 1318    Functional Assessment Tool Used (Outpatient Only)  Clinical judgement based on assessment of strength, mobility and functional testing     Functional Limitation  Mobility: Walking and moving around    Mobility: Walking and Moving Around Current Status (F6433)  At least 60 percent but less than 80 percent impaired, limited or restricted    Mobility: Walking and Moving Around Goal Status (I9518)  At least 40 percent but less than 60 percent impaired, limited or restricted        Problem List Patient Active Problem List   Diagnosis Date Noted  . Paroxysmal (Persistent) atrial fibrillation ([facility]) -- CHA2DS2-VASc Score; On Eliquis 10/01/2015  . Persistent atrial fibrillation ([place]):  CHA2DS2-VASc Score 4   . Exertional dyspnea and fatigue 08/13/2013  . CAD S/P percutaneous coronary angioplasty   . Hyperlipidemia with target LDL less than 70   . Essential hypertension   .  Obesity (BMI 30.0-34.9)   . Myocardial infarction; history of   . Osteoarthritis of right knee 05/11/2012    1:25 PM,06-20-17 Elly Modena PT, DPT [facility] [address], [place], [place], [address] Phone: [phone]   Fax:  [phone]  Name: BREYON SIGG MRN: [id number] Date of Birth: [birth year]

## 2017-06-12 ENCOUNTER — Ambulatory Visit: Payer: Medicare Other | Attending: Family Medicine | Admitting: Physical Therapy

## 2017-06-12 DIAGNOSIS — R2681 Unsteadiness on feet: Secondary | ICD-10-CM

## 2017-06-12 DIAGNOSIS — M6281 Muscle weakness (generalized): Secondary | ICD-10-CM | POA: Insufficient documentation

## 2017-06-12 DIAGNOSIS — R2689 Other abnormalities of gait and mobility: Secondary | ICD-10-CM | POA: Insufficient documentation

## 2017-06-12 NOTE — Patient Instructions (Signed)
     SEATED MARCHING  While seated in a chair, lift up your foot and knee, set it down and then perform on the other leg. Repeat this alternating movement.   x10 reps each leg    ELASTIC BAND - SEATED CLAMS  While sitting in a chair and an elastic band wrapped around your knees, move both knees to the sides to separate your legs. Keep contact of your feet on the floor the entire time.  x15 reps with red band around knees.    BALL SQUEEZE - SEATED  While sitting, place a ball between your knees. Squeeze the ball with your knees and hold 5 sec. 15 reps.     Seated calf raises  Sit towards the edge of a chair or bed with feet flat on the floor. Lift your heels up so that you are on your toes. Return to start and repeat x20 reps.   [facility] [address], [place], [place] [address] Phone # [phone] Fax [phone]

## 2017-06-12 NOTE — Therapy (Signed)
[facility] [address] [place], [place], [address] Phone: [phone]   Fax:  [phone]  Physical Therapy Treatment  Patient Details  Name: Jerry [name] MRN: [id number] Date of Birth: Nov 06, 1929 Referring Provider: [name], MD    Encounter Date: 06/12/2017  PT End of Session - 06/12/17 1132    Visit Number  2    Number of Visits  17    Date for PT Re-Evaluation  07/18/17    Authorization Type  Medicare A and B    Authorization Time Period  1218/19 to 07/18/17    Authorization - Visit Number  2    Authorization - Number of Visits  10    PT Start Time  5277 Pt arrived late to appointment     PT Stop Time  1142    PT Time Calculation (min)  30 min    Activity Tolerance  No increased pain;Patient limited by fatigue    Behavior During Therapy  [facility] for tasks assessed/performed       Past Medical History:  Diagnosis Date  . Anxiety   . CAD S/P percutaneous coronary angioplasty 1996, 2009, 01/2010   PTCA of L Cx 1996; BMS-RCA [facility]) 2009; Staged PCI RCA (70% ISR) --> 3.0 mm x 23 mm BMS, staged PCI Diag - 2.0 mm x 12 mm Mini Vision BMS (2.25 mm)  . Cataracts, bilateral    immature  . Chronic back pain    scoliosis--pt states unable to have surgery bc of age  . GERD (gastroesophageal reflux disease)    takes Nexium daily  . History of colon polyps   . Hyperlipidemia   . Hypertension   . Myocardial infarction [facility]) 1996, 2009   x 2;last time was about 8-56yrs ago   . Obesity (BMI 30.0-34.9)   . OSA (obstructive sleep apnea)   . Osteoarthritis of both knees   . Peripheral edema   . Persistent atrial fibrillation ([place])   . Prostate cancer ([place]) 2005   seed implant    Past Surgical History:  Procedure Laterality Date  . CARDIOVERSION N/A 06/12/2015   Procedure: CARDIOVERSION;  Surgeon: Sanda Klein, MD;  Location: [place] [facility];  Service: Cardiovascular;  Laterality: N/A;  . CARDIOVERSION N/A  08/02/2015   Procedure: CARDIOVERSION;  Surgeon: [name], MD;  Location: [place];  Service: Cardiovascular;  Laterality: N/A;  . CHOLECYSTECTOMY    . COLONOSCOPY    . CORONARY ANGIOPLASTY WITH STENT PLACEMENT  1996, 2009, August 2011   PTCA of L Cx 1996; BMS-RCA [facility]) 2009; Staged PCI RCA (70% ISR) --> 3.0 mm x 23 mm BMS, staged PCI Diag - 2.0 mm x 12 mm Mini Vision BMS (2.25 mm)  . cyst removed  in college  . NEUROPLASTY / TRANSPOSITION MEDIAN NERVE AT CARPAL TUNNEL BILATERAL    . NM MYOVIEW LTD  November 2013   EF 53%; fixed mid inferior-inferolateral defect, infarct with no ischemia.  [name] NM MYOVIEW LTD  11/24/2010   ejection fraction 58%,left ventricle is normal size ,no evidence of inducible myocardial ischemia  . right knee arthrsoscopy  48yrs ago  . right trigger finger    . seeds placed into prostate   2005  . TEE WITHOUT CARDIOVERSION N/A 06/12/2015   Procedure: TRANSESOPHAGEAL ECHOCARDIOGRAM (TEE);  Surgeon: Sanda Klein, MD;  Location: [facility] ENDOSCOPY;  Service: Cardiovascular: EF 55-60%.No LA or LAA thrombus. Normal Peal PAP ~33 mmHg.  [name] TOTAL KNEE ARTHROPLASTY  05/09/2012   Procedure: TOTAL  KNEE ARTHROPLASTY;  Surgeon: Kerin Salen, MD;  Location: [place];  Service: Orthopedics;  Laterality: Right;  . TRANSTHORACIC ECHOCARDIOGRAM  06/2015   EF 60-65%. Mild LA dilation. Normal PA pressures: 32 mmHg. Aortic sclerosis but no stenosis.  [name] wisdom teeth extraccted      There were no vitals filed for this visit.  Subjective Assessment - 06/12/17 1116    Subjective  Pt reports that he is feeling extra tired this morning and is overwhelmed with everything going on at home. He has alot of company coming today which he has to get ready for.     Pertinent History  persistent A-fib, history of MI, HLD, exertional dyspnea and fatigue, HTN    Patient Stated Goals  improve strength and balance     Currently in Pain?  No/denies                      [facility] Adult PT  Treatment/Exercise - 06/12/17 0001      Exercises   Exercises  Knee/Hip      Knee/Hip Exercises: Aerobic   Nustep  L1, x5 min      Knee/Hip Exercises: Seated   Ball Squeeze  15x5 sec hold     Other Seated Knee/Hip Exercises  single leg clams red TB x15 reps Lt and Rt     Other Seated Knee/Hip Exercises  B heel raises x25 reps; B ankle DF x25 reps     Marching  Both;1 set;15 reps          Balance Exercises - 06/12/17 1144      Balance Exercises: Standing   Standing Eyes Opened  Narrow base of support (BOS);2 reps;30 secs    Standing Eyes Closed  Wide (BOA);2 reps;30 secs        PT Education - 06/12/17 1145    Education provided  Yes    Education Details  importance of completing HEP to allow for more time spent on balance activity during PT sessions and to encourage quicker improvements    Person(s) Educated  Patient    Methods  Explanation;Handout    Comprehension  Verbalized understanding;Returned demonstration       PT Short Term Goals - 05/23/17 1309      PT SHORT TERM GOAL #1   Title  Pt will demo consistency and independence with his HEP to improve safety with functional tasks.     Time  4    Period  Weeks    Status  New    Target Date  06/20/17      PT SHORT TERM GOAL #2   Title  The patient will have improved trunk and LE strength evident by his ability stand 3 min during a session without need for a rest break.     Time  4    Period  Weeks    Status  New      PT SHORT TERM GOAL #3   Title  Pt will demo improved walking tolerance up to atleast 5 min without the need for rest breaks, to increase his independence with going to the grocery store or attending church.     Time  4    Period  Weeks    Status  New      PT SHORT TERM GOAL #4   Title  Pt will demo improved hip flexor muscle length to atleast 0 deg, to improve his mechanics with ambulation and decrease energy expenditure.  Time  4    Period  Weeks    Status  New      PT SHORT TERM GOAL  #5   Title  Pt will demo improved functional strength evident by his ability to complete sit to stand atleast 5 consecutive reps without the need for UE support.     Time  4    Period  Weeks    Status  New        PT Long Term Goals - 05/23/17 1315      PT LONG TERM GOAL #1   Title  The patient will be independent with safe self progression of HEP to improved walking distance and further conditioning at discharge.    Time  8    Period  Weeks    Status  New    Target Date  07/18/17      PT LONG TERM GOAL #2   Title  Pt will demo improved LE strenth and steadiness with stairs, evident by his ability to ascend/descend clinic steps with reciprocal pattern and without assistance, 2/3 trials.     Time  8    Period  Weeks    Status  New      PT LONG TERM GOAL #3   Title  Pt will complete 5x sit to stand in less than 14 sec with minimal UE support, to reflect an improvement in his functional strength and power.    Time  8    Period  Weeks    Status  New      PT LONG TERM GOAL #4   Title  Pt will demo improved berg balance score of atleast 8 points, to reflect improvements in balance and decrease risk of falling.     Time  8    Period  Weeks    Status  New      PT LONG TERM GOAL #5   Title  Pt will demo improved mobility and balance, evident by her ability to complete the TUG in atleast 13 sec with LRAD.     Time  8    Period  Weeks    Status  New            Plan - 06/12/17 1133    Clinical Impression Statement  Pt arrived late to today's appointment, reporting being overwhelmed with everything going on at home. Session focused on establishing a HEP for him to be completing at home. Therapist encouraged pt to complete this daily in order to promote faster gains in strength and balance. Ended session without any reports of significant fatigue and pt demonstrated good understanding of everything addressed. Will continue with current POC.    Rehab Potential  Good    Clinical  Impairments Affecting Rehab Potential  (+) pt seems highly organized, (-) seldomly active at home    PT Frequency  2x / week    PT Duration  8 weeks    PT Treatment/Interventions  ADLs/Self Care Home Management;Moist Heat;Cryotherapy;Balance training;Therapeutic exercise;Therapeutic activities;Functional mobility training;Stair training;Gait training;Neuromuscular re-education;Patient/family education;Manual techniques;Passive range of motion;Dry needling    PT Next Visit Plan  f/u on HEP adherence; progression of LE strength, balance activity    Consulted and Agree with Plan of Care  Patient       Patient will benefit from skilled therapeutic intervention in order to improve the following deficits and impairments:  Abnormal gait, Decreased activity tolerance, Decreased balance, Decreased safety awareness, Difficulty walking, Impaired flexibility, Obesity, Hypomobility, Decreased strength,  Decreased range of motion, Decreased endurance, Decreased mobility, Postural dysfunction, Improper body mechanics  Visit Diagnosis: Muscle weakness (generalized)  Unsteadiness on feet  Other abnormalities of gait and mobility     Problem List Patient Active Problem List   Diagnosis Date Noted  . Paroxysmal (Persistent) atrial fibrillation (HCC) -- CHA2DS2-VASc Score; On Eliquis 10/01/2015  . Persistent atrial fibrillation ([place]):  CHA2DS2-VASc Score 4   . Exertional dyspnea and fatigue 08/13/2013  . CAD S/P percutaneous coronary angioplasty   . Hyperlipidemia with target LDL less than 70   . Essential hypertension   . Obesity (BMI 30.0-34.9)   . Myocardial infarction; history of   . Osteoarthritis of right knee 05/11/2012    11:46 AM,06/12/17 Elly Modena PT, DPT [facility] [address] [place], [place], [address] Phone: [phone]   Fax:  [phone]  Name:  Jerry [name] MRN: [id number] Date of Birth: 1929/12/14

## 2017-06-13 ENCOUNTER — Emergency Department (HOSPITAL_COMMUNITY)
Admission: EM | Admit: 2017-06-13 | Discharge: 2017-06-14 | Disposition: A | Discharge status: Home / Self Care | Payer: Medicare Other | Attending: Emergency Medicine | Admitting: Emergency Medicine

## 2017-06-13 ENCOUNTER — Encounter (HOSPITAL_COMMUNITY): Payer: Self-pay | Admitting: Emergency Medicine

## 2017-06-13 DIAGNOSIS — Z96651 Presence of right artificial knee joint: Secondary | ICD-10-CM | POA: Diagnosis not present

## 2017-06-13 DIAGNOSIS — I10 Essential (primary) hypertension: Secondary | ICD-10-CM | POA: Diagnosis not present

## 2017-06-13 DIAGNOSIS — Z8546 Personal history of malignant neoplasm of prostate: Secondary | ICD-10-CM | POA: Diagnosis not present

## 2017-06-13 DIAGNOSIS — I2510 Atherosclerotic heart disease of native coronary artery without angina pectoris: Secondary | ICD-10-CM | POA: Insufficient documentation

## 2017-06-13 DIAGNOSIS — R040 Epistaxis: Secondary | ICD-10-CM | POA: Insufficient documentation

## 2017-06-13 DIAGNOSIS — R030 Elevated blood-pressure reading, without diagnosis of hypertension: Secondary | ICD-10-CM | POA: Diagnosis not present

## 2017-06-13 DIAGNOSIS — Z7901 Long term (current) use of anticoagulants: Secondary | ICD-10-CM | POA: Insufficient documentation

## 2017-06-13 DIAGNOSIS — Z87891 Personal history of nicotine dependence: Secondary | ICD-10-CM | POA: Insufficient documentation

## 2017-06-13 DIAGNOSIS — Z955 Presence of coronary angioplasty implant and graft: Secondary | ICD-10-CM | POA: Insufficient documentation

## 2017-06-13 DIAGNOSIS — Z79899 Other long term (current) drug therapy: Secondary | ICD-10-CM | POA: Diagnosis not present

## 2017-06-13 LAB — CBC
HEMATOCRIT: 43.4 % (ref 39.0–52.0)
HEMOGLOBIN: 14 g/dL (ref 13.0–17.0)
MCH: 31.9 pg (ref 26.0–34.0)
MCHC: 32.3 g/dL (ref 30.0–36.0)
MCV: 98.9 fL (ref 78.0–100.0)
Platelets: 217 10*3/uL (ref 150–400)
RBC: 4.39 MIL/uL (ref 4.22–5.81)
RDW: 13 % (ref 11.5–15.5)
WBC: 10.5 10*3/uL (ref 4.0–10.5)

## 2017-06-13 LAB — COMPREHENSIVE METABOLIC PANEL
ALBUMIN: 3.5 g/dL (ref 3.5–5.0)
ALK PHOS: 64 U/L (ref 38–126)
ALT: 8 U/L — AB (ref 17–63)
AST: 19 U/L (ref 15–41)
Anion gap: 10 (ref 5–15)
BILIRUBIN TOTAL: 1.1 mg/dL (ref 0.3–1.2)
BUN: 25 mg/dL — AB (ref 6–20)
CO2: 23 mmol/L (ref 22–32)
CREATININE: 1.04 mg/dL (ref 0.61–1.24)
Calcium: 9.2 mg/dL (ref 8.9–10.3)
Chloride: 101 mmol/L (ref 101–111)
GFR calc Af Amer: >60 mL/min (ref >60)
GFR calc non Af Amer: >60 mL/min (ref >60)
GLUCOSE: 112 mg/dL — AB (ref 65–99)
Potassium: 4.1 mmol/L (ref 3.5–5.1)
Sodium: 134 mmol/L — ABNORMAL LOW (ref 135–145)
TOTAL PROTEIN: 6.9 g/dL (ref 6.5–8.1)

## 2017-06-13 MED ORDER — PHENYLEPHRINE HCL 0.5 % NA SOLN
1.0000 [drp] | Freq: Once | NASAL | Status: AC
  Administered 2017-06-13: 1 [drp] via NASAL
  Filled 2017-06-13 (×2): qty 15

## 2017-06-13 MED ORDER — OXYMETAZOLINE HCL 0.05 % NA SOLN
1.0000 | Freq: Once | NASAL | Status: AC
  Administered 2017-06-13: 1 via NASAL
  Filled 2017-06-13: qty 15

## 2017-06-13 NOTE — ED Provider Notes (Addendum)
[facility] EMERGENCY DEPARTMENT Provider Note   CSN: [id number] Arrival date & time: 06/13/17  2054     History   Chief Complaint Chief Complaint  Patient presents with  . Epistaxis    HPI Jerry [name] is a 84 y.o. male.  HPI Patient on Eliquis presents with epistaxis. Patient was in his usual state of health until about 2 hours prior to ED arrival, when he noticed bleeding from his right nostril. Since onset bleeding is been persistent in spite of pressure application. No lightheadedness, no chest pain, no headache. Past Medical History:  Diagnosis Date  . Anxiety   . CAD S/P percutaneous coronary angioplasty 1996, 2009, 01/2010   PTCA of L Cx 1996; BMS-RCA [facility]) 2009; Staged PCI RCA (70% ISR) --> 3.0 mm x 23 mm BMS, staged PCI Diag - 2.0 mm x 12 mm Mini Vision BMS (2.25 mm)  . Cataracts, bilateral    immature  . Chronic back pain    scoliosis--pt states unable to have surgery bc of age  . GERD (gastroesophageal reflux disease)    takes Nexium daily  . History of colon polyps   . Hyperlipidemia   . Hypertension   . Myocardial infarction [facility]) 1996, 2009   x 2;last time was about 8-13yrs ago   . Obesity (BMI 30.0-34.9)   . OSA (obstructive sleep apnea)   . Osteoarthritis of both knees   . Peripheral edema   . Persistent atrial fibrillation ([place])   . Prostate cancer ([place]) 2005   seed implant    Patient Active Problem List   Diagnosis Date Noted  . Paroxysmal (Persistent) atrial fibrillation (HCC) -- CHA2DS2-VASc Score; On Eliquis 10/01/2015  . Persistent atrial fibrillation ([place]):  CHA2DS2-VASc Score 4   . Exertional dyspnea and fatigue 08/13/2013  . CAD S/P percutaneous coronary angioplasty   . Hyperlipidemia with target LDL less than 70   . Essential hypertension   . Obesity (BMI 30.0-34.9)   . Myocardial infarction; history of   . Osteoarthritis of right knee 05/11/2012    Past Surgical History:  Procedure Laterality Date  .  CARDIOVERSION N/A 06/12/2015   Procedure: CARDIOVERSION;  Surgeon: Sanda Klein, MD;  Location: [facility]COPY;  Service: Cardiovascular;  Laterality: N/A;  . CARDIOVERSION N/A 08/02/2015   Procedure: CARDIOVERSION;  Surgeon: [name], MD;  Location: [place];  Service: Cardiovascular;  Laterality: N/A;  . CHOLECYSTECTOMY    . COLONOSCOPY    . CORONARY ANGIOPLASTY WITH STENT PLACEMENT  1996, 2009, August 2011   PTCA of L Cx 1996; BMS-RCA [facility]) 2009; Staged PCI RCA (70% ISR) --> 3.0 mm x 23 mm BMS, staged PCI Diag - 2.0 mm x 12 mm Mini Vision BMS (2.25 mm)  . cyst removed  in college  . NEUROPLASTY / TRANSPOSITION MEDIAN NERVE AT CARPAL TUNNEL BILATERAL    . NM MYOVIEW LTD  November 2013   EF 53%; fixed mid inferior-inferolateral defect, infarct with no ischemia.  [name] NM MYOVIEW LTD  11/24/2010   ejection fraction 58%,left ventricle is normal size ,no evidence of inducible myocardial ischemia  . right knee arthrsoscopy  65yrs ago  . right trigger finger    . seeds placed into prostate   2005  . TEE WITHOUT CARDIOVERSION N/A 06/12/2015   Procedure: TRANSESOPHAGEAL ECHOCARDIOGRAM (TEE);  Surgeon: Sanda Klein, MD;  Location: [facility] ENDOSCOPY;  Service: Cardiovascular: EF 55-60%.No LA or LAA thrombus. Normal Peal PAP ~33 mmHg.  [name] TOTAL KNEE ARTHROPLASTY  05/09/2012  Procedure: TOTAL KNEE ARTHROPLASTY;  Surgeon: Kerin Salen, MD;  Location: [place];  Service: Orthopedics;  Laterality: Right;  . TRANSTHORACIC ECHOCARDIOGRAM  06/2015   EF 60-65%. Mild LA dilation. Normal PA pressures: 32 mmHg. Aortic sclerosis but no stenosis.  [name] wisdom teeth extraccted         Home Medications    Prior to Admission medications   Medication Sig Start Date End Date Taking? Authorizing Provider  acetaminophen (TYLENOL) 500 MG tablet Take 1,000 mg by mouth daily as needed (pain).   Yes [provider]  atorvastatin (LIPITOR) 40 MG tablet TAKE 1 TABLET DAILY AT BEDTIME Patient taking differently: TAKE 40 mg  TABLET DAILY AT BEDTIME 11/24/16  Yes Leonie Man, MD  Cholecalciferol (VITAMIN D3) 1000 units CAPS Take 1 capsule by mouth daily.    Yes [provider]  diazepam (VALIUM) 2 MG tablet Take 2 mg by mouth daily as needed for anxiety.    Yes [provider]  diltiazem (CARDIZEM CD) 120 MG 24 hr capsule Take 1 capsule (120 mg total) by mouth daily. 03/23/17  Yes Leonie Man, MD  dofetilide (TIKOSYN) 500 MCG capsule TAKE 1 CAPSULE TWICE A DAY Patient taking differently: TAKE 500 mg CAPSULE TWICE A DAY 04/11/17  Yes Baldwin [place], PA-C  ELIQUIS 5 MG TABS tablet TAKE 1 TABLET TWICE A DAY Patient taking differently: TAKE 5 mg TABLET TWICE A DAY 01/11/17  Yes Leonie Man, MD  esomeprazole (Arma) 40 MG capsule TAKE 1 CAPSULE DAILY Patient taking differently: TAKE 40 mg CAPSULE DAILY 03/27/17  Yes Leonie Man, MD  furosemide (LASIX) 20 MG tablet TAKE 1 TABLET DAILY AS NEEDED Patient taking differently: TAKE 20 mg  TABLET DAILY AS NEEDED 08/31/16  Yes Leonie Man, MD  hydrocortisone cream 0.5 % Apply 1 application topically daily. Use as directed   Yes [provider]  losartan (COZAAR) 100 MG tablet TAKE 1 TABLET DAILY (CONTACT OFFICE FOR REFILLS) Patient taking differently: TAKE 100 mg  TABLET DAILY (CONTACT OFFICE FOR REFILLS) 03/09/17  Yes Leonie Man, MD  magnesium hydroxide (MILK OF MAGNESIA) 400 MG/5ML suspension Take 30 mLs by mouth daily as needed for mild constipation.   Yes [provider]  magnesium oxide (MAG-OX) 400 MG tablet Take 1 tablet (400 mg total) by mouth 2 (two) times daily. 08/19/15  Yes Sherran Needs, NP  mirabegron ER (MYRBETRIQ) 25 MG TB24 tablet Take 25 mg by mouth daily.    Yes [provider]  NITROSTAT 0.4 MG SL tablet USE AS NEEDED 02/22/17  Yes Leonie Man, MD  Polyethylene Glycol 3350 (MIRALAX PO) Take 17 g by mouth daily as needed (Constipation).    Yes [provider]  potassium  chloride SA (K-DUR,KLOR-CON) 20 MEQ tablet Take 1 tablet (20 mEq total) by mouth daily. 03/02/17  Yes Allred, Jeneen Rinks, MD  tamsulosin (FLOMAX) 0.4 MG CAPS capsule Take 0.4 mg by mouth at bedtime.  06/24/15  Yes [provider]    Family History Family History  Problem Relation Age of Onset  . Cancer Mother 85       abdomen died in 6s  . Heart attack Father 22       died in 71s    Social History Social History   Tobacco Use  . Smoking status: Former Smoker    Types: Pipe    Last attempt to quit: 03/12/1993    Years since quitting: 24.2  . Smokeless  tobacco: Never Used  . Tobacco comment: quit smoking pipe about 73yrs ago  Substance Use Topics  . Alcohol use: Yes    Comment: glass of wine daily  . Drug use: No     Allergies   Crab [shellfish allergy]; Hydrocodone; Adhesive [tape]; and Oxycodone   Review of Systems Review of Systems   Physical Exam Updated Vital Signs BP (!) 175/100 (BP Location: Right Arm)   Pulse 96   Temp 97.8 F (36.6 C) (Oral)   Resp 18   SpO2 98%   Physical Exam  Constitutional: He is oriented to person, place, and time. He appears well-developed.  Uncomfortable appearing elderly M w bleeding from R nostril  HENT:  Head: Normocephalic and atraumatic.  Nose:    Eyes: Conjunctivae and EOM are normal.  Cardiovascular: Normal rate and regular rhythm.  Pulmonary/Chest: Effort normal. No stridor. No respiratory distress.  Abdominal: He exhibits no distension.  Musculoskeletal: He exhibits no edema.  Neurological: He is alert and oriented to person, place, and time.  Skin: Skin is warm and dry.  Psychiatric: He has a normal mood and affect.  Nursing note and vitals reviewed.    ED Treatments / Results  Labs (all labs ordered are listed, but only abnormal results are displayed) Labs Reviewed  COMPREHENSIVE METABOLIC PANEL - Abnormal; Notable for the following components:      Result Value   Sodium 134 (*)    Glucose, Bld 112  (*)    BUN 25 (*)    ALT 8 (*)    All other components within normal limits  CBC    Procedures .Epistaxis Management Date/Time: 06/13/2017 10:00 PM Performed by: Carmin Muskrat, MD Authorized by: Carmin Muskrat, MD   Consent:    Consent obtained:  Verbal Universal protocol:    Procedure explained and questions answered to patient or proxy's satisfaction: yes     Patient identity confirmed:  Verbally with patient Anesthesia (see MAR for exact dosages):    Anesthesia method:  None Procedure details:    Treatment site:  R anterior   Treatment method:  Nasal tampon (phenylephrine packing)   Treatment complexity:  Limited   Treatment episode: initial   Post-procedure details:    Assessment:  Bleeding stopped   Patient tolerance of procedure:  Tolerated well, no immediate complications   (including critical care time)  Medications Ordered in ED Medications  phenylephrine (NEO-SYNEPHRINE) 0.5 % nasal solution 1 drop (not administered)  oxymetazoline (AFRIN) 0.05 % nasal spray 1 spray (1 spray Each Nare Given 06/13/17 2110)     Initial Impression / Assessment and Plan / ED Course  I have reviewed the triage vital signs and the nursing notes.  Pertinent labs & imaging results that were available during my care of the patient were reviewed by me and considered in my medical decision making (see chart for details).  11:40 PM No recurrence of bleeding. I had a lengthy conversation with the patient and his wife about home care instructions, ENT follow-up, return precautions.  11:58 PM Prior to discharge the patient had a recurrence of his bleeding, requiring placement of a packing device. This was well-tolerated. Patient has previously scheduled follow-up with his physician tomorrow, will maintain that appointment and follow-up with ENT as well.   Final Clinical Impressions(s) / ED Diagnoses   Final diagnoses:  Epistaxis    ED Discharge Orders    None         Carmin Muskrat, MD 06/13/17 2359  Carmin Muskrat, MD 06/29/17 1007

## 2017-06-13 NOTE — Discharge Instructions (Signed)
As discussed, if your nose bleed returns, please hold pressure for 20 minutes, check, and again hold pressure for 20 minutes. If it bleeds through these 2 episodes of pressure, return here for further evaluation. Otherwise, keep your nasal passage moist, monitor your condition carefully, and be sure to follow-up with your physician and our ENT specialist.

## 2017-06-13 NOTE — ED Triage Notes (Signed)
Per EMS:  Pt presents to ED for assessment of nosebleed starting approx 1 hour ago through his right nare.  Pt is on Eliquis.  Hypertensive en route.

## 2017-06-14 DIAGNOSIS — L570 Actinic keratosis: Secondary | ICD-10-CM | POA: Diagnosis not present

## 2017-06-14 DIAGNOSIS — D045 Carcinoma in situ of skin of trunk: Secondary | ICD-10-CM | POA: Diagnosis not present

## 2017-06-16 ENCOUNTER — Encounter: Payer: Self-pay | Admitting: Physical Therapy

## 2017-06-16 ENCOUNTER — Ambulatory Visit: Payer: Medicare Other | Admitting: Physical Therapy

## 2017-06-16 DIAGNOSIS — R2689 Other abnormalities of gait and mobility: Secondary | ICD-10-CM

## 2017-06-16 DIAGNOSIS — R2681 Unsteadiness on feet: Secondary | ICD-10-CM

## 2017-06-16 DIAGNOSIS — M6281 Muscle weakness (generalized): Secondary | ICD-10-CM | POA: Diagnosis not present

## 2017-06-16 NOTE — Therapy (Signed)
[facility] [address], [place], [place], [address] Phone: [phone]   Fax:  [phone]  Physical Therapy Treatment  Patient Details  Name: MARKEVIOUS EHMKE MRN: [id number] Date of Birth: May 29, 1930 Referring Provider: [name], MD    Encounter Date: 06/16/2017  PT End of Session - 06/16/17 1141    Visit Number  3    Number of Visits  17    Date for PT Re-Evaluation  07/18/17    Authorization Type  Medicare A and B    Authorization Time Period  1218/19 to 07/18/17    Authorization - Visit Number  3    Authorization - Number of Visits  10    PT Start Time  1100    PT Stop Time  6962    PT Time Calculation (min)  38 min    Activity Tolerance  No increased pain;Patient limited by fatigue    Behavior During Therapy  [facility] for tasks assessed/performed       Past Medical History:  Diagnosis Date  . Anxiety   . CAD S/P percutaneous coronary angioplasty 1996, 2009, 01/2010   PTCA of L Cx 1996; BMS-RCA [facility]) 2009; Staged PCI RCA (70% ISR) --> 3.0 mm x 23 mm BMS, staged PCI Diag - 2.0 mm x 12 mm Mini Vision BMS (2.25 mm)  . Cataracts, bilateral    immature  . Chronic back pain    scoliosis--pt states unable to have surgery bc of age  . GERD (gastroesophageal reflux disease)    takes Nexium daily  . History of colon polyps   . Hyperlipidemia   . Hypertension   . Myocardial infarction [facility]) 1996, 2009   x 2;last time was about 8-71yrs ago   . Obesity (BMI 30.0-34.9)   . OSA (obstructive sleep apnea)   . Osteoarthritis of both knees   . Peripheral edema   . Persistent atrial fibrillation ([place])   . Prostate cancer ([place]) 2005   seed implant    Past Surgical History:  Procedure Laterality Date  . CARDIOVERSION N/A 06/12/2015   Procedure: CARDIOVERSION;  Surgeon: Sanda Klein, MD;  Location: [place] ENDOSCOPY;  Service: Cardiovascular;  Laterality: N/A;  . CARDIOVERSION N/A 08/02/2015   Procedure:  CARDIOVERSION;  Surgeon: [name], MD;  Location: [place];  Service: Cardiovascular;  Laterality: N/A;  . CHOLECYSTECTOMY    . COLONOSCOPY    . CORONARY ANGIOPLASTY WITH STENT PLACEMENT  1996, 2009, August 2011   PTCA of L Cx 1996; BMS-RCA [facility]) 2009; Staged PCI RCA (70% ISR) --> 3.0 mm x 23 mm BMS, staged PCI Diag - 2.0 mm x 12 mm Mini Vision BMS (2.25 mm)  . cyst removed  in college  . NEUROPLASTY / TRANSPOSITION MEDIAN NERVE AT CARPAL TUNNEL BILATERAL    . NM MYOVIEW LTD  November 2013   EF 53%; fixed mid inferior-inferolateral defect, infarct with no ischemia.  [name] NM MYOVIEW LTD  11/24/2010   ejection fraction 58%,left ventricle is normal size ,no evidence of inducible myocardial ischemia  . right knee arthrsoscopy  16yrs ago  . right trigger finger    . seeds placed into prostate   2005  . TEE WITHOUT CARDIOVERSION N/A 06/12/2015   Procedure: TRANSESOPHAGEAL ECHOCARDIOGRAM (TEE);  Surgeon: Sanda Klein, MD;  Location[facility];  Service: Cardiovascular: EF 55-60%.No LA or LAA thrombus. Normal Peal PAP ~33 mmHg.  [name] TOTAL KNEE ARTHROPLASTY  05/09/2012   Procedure: TOTAL KNEE ARTHROPLASTY;  Surgeon: Kathalene Frames  Mayer Camel, MD;  Location: [place];  Service: Orthopedics;  Laterality: Right;  . TRANSTHORACIC ECHOCARDIOGRAM  06/2015   EF 60-65%. Mild LA dilation. Normal PA pressures: 32 mmHg. Aortic sclerosis but no stenosis.  [name] wisdom teeth extraccted      There were no vitals filed for this visit.  Subjective Assessment - 06/16/17 1113    Subjective  Pt reports that he did not complete his HEP over the past couple of days because of company and a trip to the ER for a nose bleed.     Pertinent History  persistent A-fib, history of MI, HLD, exertional dyspnea and fatigue, HTN    Patient Stated Goals  improve strength and balance     Currently in Pain?  No/denies                  HR monitored during session: 72-90 BPM SpO2 monitored during session: 92-96%    [facility] Adult PT  Treatment/Exercise - 06/16/17 0001      Knee/Hip Exercises: Aerobic   Nustep  L1 x7 min, therapist present to discuss HEP adherence/ER visit      Knee/Hip Exercises: Seated   Long Arc Quad  1 set;Both;15 reps;Weights    Long Arc Quad Weight  4 lbs.    Heel Slides  Both;1 set;10 reps;Limitations    Heel Slides Limitations  double red TB     Ball Squeeze  15x5 sec hold     Other Seated Knee/Hip Exercises  single leg clams red TB 2x15 reps Lt and Rt     Other Seated Knee/Hip Exercises  x20 reps, 4# ankle weights     Marching  Both;1 set;20 reps          Balance Exercises - 06/16/17 1129      Balance Exercises: Standing   Standing Eyes Closed  Wide (BOA);Solid surface;3 reps;20 secs;Other (comment) eyes closed     Tandem Stance  Eyes open;2 reps;20 secs;Other (comment) partial tandem         PT Education - 06/16/17 1141    Education provided  Yes    Education Details  importance of completing HEP     Person(s) Educated  Patient    Methods  Explanation    Comprehension  Verbalized understanding       PT Short Term Goals - 05/23/17 1309      PT SHORT TERM GOAL #1   Title  Pt will demo consistency and independence with his HEP to improve safety with functional tasks.     Time  4    Period  Weeks    Status  New    Target Date  06/20/17      PT SHORT TERM GOAL #2   Title  The patient will have improved trunk and LE strength evident by his ability stand 3 min during a session without need for a rest break.     Time  4    Period  Weeks    Status  New      PT SHORT TERM GOAL #3   Title  Pt will demo improved walking tolerance up to atleast 5 min without the need for rest breaks, to increase his independence with going to the grocery store or attending church.     Time  4    Period  Weeks    Status  New      PT SHORT TERM GOAL #4   Title  Pt will demo improved hip flexor muscle  length to atleast 0 deg, to improve his mechanics with ambulation and decrease energy  expenditure.     Time  4    Period  Weeks    Status  New      PT SHORT TERM GOAL #5   Title  Pt will demo improved functional strength evident by his ability to complete sit to stand atleast 5 consecutive reps without the need for UE support.     Time  4    Period  Weeks    Status  New        PT Long Term Goals - 05/23/17 1315      PT LONG TERM GOAL #1   Title  The patient will be independent with safe self progression of HEP to improved walking distance and further conditioning at discharge.    Time  8    Period  Weeks    Status  New    Target Date  07/18/17      PT LONG TERM GOAL #2   Title  Pt will demo improved LE strenth and steadiness with stairs, evident by his ability to ascend/descend clinic steps with reciprocal pattern and without assistance, 2/3 trials.     Time  8    Period  Weeks    Status  New      PT LONG TERM GOAL #3   Title  Pt will complete 5x sit to stand in less than 14 sec with minimal UE support, to reflect an improvement in his functional strength and power.    Time  8    Period  Weeks    Status  New      PT LONG TERM GOAL #4   Title  Pt will demo improved berg balance score of atleast 8 points, to reflect improvements in balance and decrease risk of falling.     Time  8    Period  Weeks    Status  New      PT LONG TERM GOAL #5   Title  Pt will demo improved mobility and balance, evident by her ability to complete the TUG in atleast 13 sec with LRAD.     Time  8    Period  Weeks    Status  New            Plan - 06/16/17 1142    Clinical Impression Statement  Today's session continued with gentle LE strengthening, balance and endurance activity. Pt has not been adhering to his HEP secondary to having company in town and a recent trip to the ER for a bloody nose. Several exercises were progressed with increased repetitions and sets without any significant difficulty, but overall there has been minimal progress towards goals. Therapist  discussed the importance of HEP adherence and strongly urged the pt to complete HEP over the weekend. He verbalized understanding at this time.    Rehab Potential  Good    Clinical Impairments Affecting Rehab Potential  (+) pt seems highly organized, (-) seldomly active at home    PT Frequency  2x / week    PT Duration  8 weeks    PT Treatment/Interventions  ADLs/Self Care Home Management;Moist Heat;Cryotherapy;Balance training;Therapeutic exercise;Therapeutic activities;Functional mobility training;Stair training;Gait training;Neuromuscular re-education;Patient/family education;Manual techniques;Passive range of motion;Dry needling    PT Next Visit Plan  f/u on HEP adherence; progression of LE strength, balance activity    Consulted and Agree with Plan of Care  Patient  Patient will benefit from skilled therapeutic intervention in order to improve the following deficits and impairments:  Abnormal gait, Decreased activity tolerance, Decreased balance, Decreased safety awareness, Difficulty walking, Impaired flexibility, Obesity, Hypomobility, Decreased strength, Decreased range of motion, Decreased endurance, Decreased mobility, Postural dysfunction, Improper body mechanics  Visit Diagnosis: Muscle weakness (generalized)  Unsteadiness on feet  Other abnormalities of gait and mobility     Problem List Patient Active Problem List   Diagnosis Date Noted  . Paroxysmal (Persistent) atrial fibrillation (HCC) -- CHA2DS2-VASc Score; On Eliquis 10/01/2015  . Persistent atrial fibrillation ([place]):  CHA2DS2-VASc Score 4   . Exertional dyspnea and fatigue 08/13/2013  . CAD S/P percutaneous coronary angioplasty   . Hyperlipidemia with target LDL less than 70   . Essential hypertension   . Obesity (BMI 30.0-34.9)   . Myocardial infarction; history of   . Osteoarthritis of right knee 05/11/2012    11:47 AM,06/16/17 Elly Modena PT, DPT [place] [facility] [address] [place], [place], [address] Phone: [phone]   Fax:  [phone]  Name: EAVEN SCHWAGER MRN: [id number] Date of Birth: 30-Jan-1930

## 2017-06-18 NOTE — Progress Notes (Addendum)
Cardiology Office Note Date:  06/19/2017  Patient ID:  Jerry [name], Jerry [name] [birth year], MRN [id number] PCP:  Aurea Graff.Marlou Sa, MD  Cardiologist:  [name] Electrophysiologist: Dr. Rayann Heman    Chief Complaint: planned 6 mo f/u  History of Present Illness: Jerry [name] is a 84 y.o. male with history of CAD (last intervention BMS to Diag 2011, prior PTCA to [place], BMS to RCA 2009. Persistent AFib, HTN, HLD, OSA, CBP/spinal stenosis.    He comes in to the office today to be seen for Dr. Rayann Heman, last seen by him in April, at that visit was in AF though by symptoms (lack of symptoms) thought to have been mostly in SR.  Planned for f/u, if persistent AF would consider rate control strategy w/his a/c.  He was seen by myself in July 2017, was in [place] with PAC's, he reported feeling well outside of his knee problem/pain, walking with the aid of a cane. He denied any palpitations or sensation like he has had AF.  No CP or SOB, no dizziness, near syncope or syncope.  He denied any bleeding/signs of bleeding  He comes in today for 6 mo planned f/u.  He saw [name] in October, he noted then the patient had significantly aged/slowed since his last visit with him, was c/o of decreased exertional capacity, did not feel was anginal and would avoid further screening exams.  Wanted to allow more BP and his dilt was reduced (to continue PRN dose).  He had an ER visit 06/13/16 with epistaxis, treated with phenylepherine packing with resolution, though did return requiring packing placed to follow up with his PMD the next day  He is acompanied by his wife who reports that he just doesn't seem to have any energy.  His PMD has enrolled him in PT though hasn't started yet.  He denies any particular symptoms, maybe winded more easily, no CP. The patient feels at 84 y/o, his energy level is appropriate, and is not personally concerned.  No palpitations or real cardiac awareness at all.  No near syncope or  syncope, no dizziness.  He has nasal packing still in place on R, sees ENT this afternoon for removal, he was advised at the ER to hold his Eliquis that evening then to resume, he has not had bleeding with the packing in place.  AF Hx AAD: Tikosyn (current)  Past Medical History:  Diagnosis Date  . Anxiety   . CAD S/P percutaneous coronary angioplasty 1996, 2009, 01/2010   PTCA of L Cx 1996; BMS-RCA [facility]) 2009; Staged PCI RCA (70% ISR) --> 3.0 mm x 23 mm BMS, staged PCI Diag - 2.0 mm x 12 mm Mini Vision BMS (2.25 mm)  . Cataracts, bilateral    immature  . Chronic back pain    scoliosis--pt states unable to have surgery bc of age  . GERD (gastroesophageal reflux disease)    takes Nexium daily  . History of colon polyps   . Hyperlipidemia   . Hypertension   . Myocardial infarction [facility]) 1996, 2009   x 2;last time was about 8-68yrs ago   . Obesity (BMI 30.0-34.9)   . OSA (obstructive sleep apnea)   . Osteoarthritis of both knees   . Peripheral edema   . Persistent atrial fibrillation ([place])   . Prostate cancer ([place]) 2005   seed implant    Past Surgical History:  Procedure Laterality Date  . CARDIOVERSION N/A 06/12/2015   Procedure: CARDIOVERSION;  Surgeon: Dani Gobble Croitoru,  MD;  Location: [place];  Service: Cardiovascular;  Laterality: N/A;  . CARDIOVERSION N/A 08/02/2015   Procedure: CARDIOVERSION;  Surgeon: [name], MD;  Location: [place];  Service: Cardiovascular;  Laterality: N/A;  . CHOLECYSTECTOMY    . COLONOSCOPY    . CORONARY ANGIOPLASTY WITH STENT PLACEMENT  1996, 2009, August 2011   PTCA of L Cx 1996; BMS-RCA [facility]) 2009; Staged PCI RCA (70% ISR) --> 3.0 mm x 23 mm BMS, staged PCI Diag - 2.0 mm x 12 mm Mini Vision BMS (2.25 mm)  . cyst removed  in college  . NEUROPLASTY / TRANSPOSITION MEDIAN NERVE AT CARPAL TUNNEL BILATERAL    . NM MYOVIEW LTD  November 2013   EF 53%; fixed mid inferior-inferolateral defect, infarct with no ischemia.  [name] NM MYOVIEW LTD   11/24/2010   ejection fraction 58%,left ventricle is normal size ,no evidence of inducible myocardial ischemia  . right knee arthrsoscopy  45yrs ago  . right trigger finger    . seeds placed into prostate   2005  . TEE WITHOUT CARDIOVERSION N/A 06/12/2015   Procedure: TRANSESOPHAGEAL ECHOCARDIOGRAM (TEE);  Surgeon: Sanda Klein, MD;  Location: [facility] ENDOSCOPY;  Service: Cardiovascular: EF 55-60%.No LA or LAA thrombus. Normal Peal PAP ~33 mmHg.  [name] TOTAL KNEE ARTHROPLASTY  05/09/2012   Procedure: TOTAL KNEE ARTHROPLASTY;  Surgeon: Kerin Salen, MD;  Location: [place];  Service: Orthopedics;  Laterality: Right;  . TRANSTHORACIC ECHOCARDIOGRAM  06/2015   EF 60-65%. Mild LA dilation. Normal PA pressures: 32 mmHg. Aortic sclerosis but no stenosis.  [name] wisdom teeth extraccted      Current Outpatient Medications  Medication Sig Dispense Refill  . acetaminophen (TYLENOL) 500 MG tablet Take 1,000 mg by mouth daily as needed (pain).    [name] atorvastatin (LIPITOR) 40 MG tablet TAKE 1 TABLET DAILY AT BEDTIME (Patient taking differently: TAKE 40 mg TABLET DAILY AT BEDTIME) 90 tablet 3  . Cholecalciferol (VITAMIN D3) 1000 units CAPS Take 1 capsule by mouth daily.     . diazepam (VALIUM) 2 MG tablet Take 2 mg by mouth daily as needed for anxiety.     [name] diltiazem (CARDIZEM CD) 120 MG 24 hr capsule Take 1 capsule (120 mg total) by mouth daily. 90 capsule 1  . dofetilide (TIKOSYN) 500 MCG capsule TAKE 1 CAPSULE TWICE A DAY (Patient taking differently: TAKE 500 mg CAPSULE TWICE A DAY) 180 capsule 2  . ELIQUIS 5 MG TABS tablet TAKE 1 TABLET TWICE A DAY (Patient taking differently: TAKE 5 mg TABLET TWICE A DAY) 180 tablet 1  . esomeprazole (NEXIUM) 40 MG capsule TAKE 1 CAPSULE DAILY (Patient taking differently: TAKE 40 mg CAPSULE DAILY) 90 capsule 3  . furosemide (LASIX) 20 MG tablet TAKE 1 TABLET DAILY AS NEEDED (Patient taking differently: TAKE 20 mg  TABLET DAILY AS NEEDED) 90 tablet 3  . hydrocortisone cream 0.5 %  Apply 1 application topically daily. Use as directed    . losartan (COZAAR) 100 MG tablet TAKE 1 TABLET DAILY (CONTACT OFFICE FOR REFILLS) (Patient taking differently: TAKE 100 mg  TABLET DAILY (CONTACT OFFICE FOR REFILLS)) 90 tablet 1  . magnesium hydroxide (MILK OF MAGNESIA) 400 MG/5ML suspension Take 30 mLs by mouth daily as needed for mild constipation.    . magnesium oxide (MAG-OX) 400 MG tablet Take 1 tablet (400 mg total) by mouth 2 (two) times daily. 60 tablet 6  . mirabegron ER (MYRBETRIQ) 25 MG TB24 tablet Take 25 mg by mouth daily.     [name]  NITROSTAT 0.4 MG SL tablet USE AS NEEDED 25 tablet 3  . Polyethylene Glycol 3350 (MIRALAX PO) Take 17 g by mouth daily as needed (Constipation).     . potassium chloride SA (K-DUR,KLOR-CON) 20 MEQ tablet Take 1 tablet (20 mEq total) by mouth daily. 90 tablet 2  . tamsulosin (FLOMAX) 0.4 MG CAPS capsule Take 0.4 mg by mouth at bedtime.      No current facility-administered medications for this visit.     Allergies:   Crab [shellfish allergy]; Hydrocodone; Adhesive [tape]; and Oxycodone   Social History:  The patient  reports that he quit smoking about 24 years ago. His smoking use included pipe. he has never used smokeless tobacco. He reports that he drinks alcohol. He reports that he does not use drugs.   Family History:  The patient's family history includes Cancer (age of onset: 54) in his mother; Heart attack (age of onset: 56) in his father.  ROS:  Please see the history of present illness.  All other systems are reviewed and otherwise negative.   PHYSICAL EXAM:  VS:  Ht 5\' 10"  (1.778 m)   Wt 231 lb (104.8 kg)   BMI 33.15 kg/m  BMI: Body mass index is 33.15 kg/m. Well nourished, well developed, in no acute distress  HEENT: normocephalic, atraumatic  Neck: no JVD, carotid bruits or masses Cardiac:   RRR; extrasystoles appreciated, no significant murmurs, no rubs, or gallops Lungs: CTA b/l, no wheezing, rhonchi or rales  Abd: soft,  nontender, obsese MS: no deformity, age appripriate atrophy Ext: trace edema  Skin: warm and dry, no rash Neuro:  No gross deficits appreciated Psych: euthymic mood, full affect   EKG:  Done today and reviewed with Dr. Lovena Le today: SR 81bpm, PACs, PR 172ms, QRS 55ms, we measured QT, 400-444ms, corrected to 465-419ms  12/23/16: was SR, atrial bigeminy, rhythm strip less frequent, 80bpm, PR 175ms, QRS 44ms, QT measured by myself is 451ms, QTc 435ms  06/21/16: TTE Study Conclusions - Left ventricle: The cavity size was normal. Systolic function was   normal. The estimated ejection fraction was in the range of 60%   to 65%. Wall motion was normal; there were no regional wall   motion abnormalities. - Left atrium: The atrium was mildly dilated. - Atrial septum: No defect or patent foramen ovale was identified. - Pulmonary arteries: PA peak pressure: 32 mm Hg (S).   Recent Labs: 12/05/2016: Magnesium 1.6 06/13/2017: ALT 8; BUN 25; Creatinine, Ser 1.04; Hemoglobin 14.0; Platelets 217; Potassium 4.1; Sodium 134  No results found for requested labs within last 8760 hours.   Estimated Creatinine Clearance: 62.9 mL/min (by C-G formula based on SCr of 1.04 mg/dL).   Wt Readings from Last 3 Encounters:  06/19/17 231 lb (104.8 kg)  03/23/17 231 lb (104.8 kg)  12/05/16 230 lb (104.3 kg)     Other studies reviewed: Additional studies/records reviewed today include: summarized above  ASSESSMENT AND PLAN:  1. Persistent AFib     CHA2DS2Vasc is 4, on Xarelto     Tikosyn, QTc is stable     He is in SR again today, will continue rhythm control strategy     06/13/17, K+ 4.1, Creat 1.04 (Calc clearance is 77), H/H stable  2. HTN    Looks OK, no changes today    Thoughts by [name] was to allow a little more BP for him  3. CAD     No anginal complaints. Follows with [name]  No ASA w/Xarelto, on statin        Disposition: Follow up with the AFib clinic in 6 months, the patient's  wife very much likes the clinic and wants to stay on board with them as well.    I have asked my MA to call the patient, I did not order mag level, and have them come in for a mag level to be drawn this week when able.  Current medicines are reviewed at length with the patient today.  The patient did not have any concerns regarding medicines.  Haywood Lasso, PA-C 06/19/2017 12:07 PM     [address] [place] [address] [phone] (office)  [phone] (fax)

## 2017-06-19 ENCOUNTER — Ambulatory Visit (INDEPENDENT_AMBULATORY_CARE_PROVIDER_SITE_OTHER): Payer: Medicare Other | Admitting: Physician Assistant

## 2017-06-19 ENCOUNTER — Telehealth: Payer: Self-pay | Admitting: *Deleted

## 2017-06-19 VITALS — BP 144/70 | HR 81 | Ht 70.0 in | Wt 231.0 lb

## 2017-06-19 DIAGNOSIS — Z79899 Other long term (current) drug therapy: Secondary | ICD-10-CM | POA: Diagnosis not present

## 2017-06-19 DIAGNOSIS — I2510 Atherosclerotic heart disease of native coronary artery without angina pectoris: Secondary | ICD-10-CM | POA: Diagnosis not present

## 2017-06-19 DIAGNOSIS — I10 Essential (primary) hypertension: Secondary | ICD-10-CM | POA: Diagnosis not present

## 2017-06-19 DIAGNOSIS — I480 Paroxysmal atrial fibrillation: Secondary | ICD-10-CM | POA: Diagnosis not present

## 2017-06-19 DIAGNOSIS — J3489 Other specified disorders of nose and nasal sinuses: Secondary | ICD-10-CM | POA: Diagnosis not present

## 2017-06-19 DIAGNOSIS — R040 Epistaxis: Secondary | ICD-10-CM | POA: Diagnosis not present

## 2017-06-19 NOTE — Patient Instructions (Signed)
Medication Instructions:   Your physician recommends that you continue on your current medications as directed. Please refer to the Current Medication list given to you today.   If you need a refill on your cardiac medications before your next appointment, please call your pharmacy.  Labwork: NONE ORDERED  TODAY    Testing/Procedures: NONE ORDERED  TODAY    Follow-Up:  Your physician wants you to follow-up in:  IN  6  MONTHS WITH AFIB CLINIC   You will receive a reminder letter in the mail two months in advance. If you don't receive a letter, please call our office to schedule the follow-up appointment.      Any Other Special Instructions Will Be Listed Below (If Applicable).                                                                                                                                                  ]

## 2017-06-19 NOTE — Telephone Encounter (Signed)
LMOVM TO CALL BACK DUE TO LABS WANTED FROM [facility] MAGNESIUM LEVEL.

## 2017-06-20 ENCOUNTER — Ambulatory Visit: Payer: Medicare Other | Admitting: Physical Therapy

## 2017-06-20 ENCOUNTER — Telehealth: Payer: Self-pay | Admitting: *Deleted

## 2017-06-20 ENCOUNTER — Encounter: Payer: Self-pay | Admitting: Physical Therapy

## 2017-06-20 DIAGNOSIS — R2689 Other abnormalities of gait and mobility: Secondary | ICD-10-CM | POA: Diagnosis not present

## 2017-06-20 DIAGNOSIS — R2681 Unsteadiness on feet: Secondary | ICD-10-CM | POA: Diagnosis not present

## 2017-06-20 DIAGNOSIS — Z79899 Other long term (current) drug therapy: Secondary | ICD-10-CM

## 2017-06-20 DIAGNOSIS — M6281 Muscle weakness (generalized): Secondary | ICD-10-CM

## 2017-06-20 NOTE — Therapy (Signed)
[facility] [address], [place], [place], [address] Phone: [phone]   Fax:  [phone]  Physical Therapy Treatment  Patient Details  Name: Jerry [name] MRN: [id number] Date of Birth: 1930-03-29 Referring Provider: [name], MD    Encounter Date: 06/20/2017  PT End of Session - 06/20/17 1230    Visit Number  4    Number of Visits  17    Date for PT Re-Evaluation  07/18/17    Authorization Type  Medicare A and B    Authorization Time Period  1218/19 to 07/18/17    Authorization - Visit Number  4    Authorization - Number of Visits  10    PT Start Time  4270    PT Stop Time  6237    PT Time Calculation (min)  38 min    Activity Tolerance  No increased pain;Patient limited by fatigue    Behavior During Therapy  [facility] for tasks assessed/performed       Past Medical History:  Diagnosis Date  . Anxiety   . CAD S/P percutaneous coronary angioplasty 1996, 2009, 01/2010   PTCA of L Cx 1996; BMS-RCA [facility]) 2009; Staged PCI RCA (70% ISR) --> 3.0 mm x 23 mm BMS, staged PCI Diag - 2.0 mm x 12 mm Mini Vision BMS (2.25 mm)  . Cataracts, bilateral    immature  . Chronic back pain    scoliosis--pt states unable to have surgery bc of age  . GERD (gastroesophageal reflux disease)    takes Nexium daily  . History of colon polyps   . Hyperlipidemia   . Hypertension   . Myocardial infarction [facility]) 1996, 2009   x 2;last time was about 8-26yrs ago   . Obesity (BMI 30.0-34.9)   . OSA (obstructive sleep apnea)   . Osteoarthritis of both knees   . Peripheral edema   . Persistent atrial fibrillation ([place])   . Prostate cancer ([place]) 2005   seed implant    Past Surgical History:  Procedure Laterality Date  . CARDIOVERSION N/A 06/12/2015   Procedure: CARDIOVERSION;  Surgeon: Sanda Klein, MD;  Location: [facility];  Service: Cardiovascular;  Laterality: N/A;  . CARDIOVERSION N/A 08/02/2015   Procedure:  CARDIOVERSION;  Surgeon: [name], MD;  Location: [place];  Service: Cardiovascular;  Laterality: N/A;  . CHOLECYSTECTOMY    . COLONOSCOPY    . CORONARY ANGIOPLASTY WITH STENT PLACEMENT  1996, 2009, August 2011   PTCA of L Cx 1996; BMS-RCA [facility]) 2009; Staged PCI RCA (70% ISR) --> 3.0 mm x 23 mm BMS, staged PCI Diag - 2.0 mm x 12 mm Mini Vision BMS (2.25 mm)  . cyst removed  in college  . NEUROPLASTY / TRANSPOSITION MEDIAN NERVE AT CARPAL TUNNEL BILATERAL    . NM MYOVIEW LTD  November 2013   EF 53%; fixed mid inferior-inferolateral defect, infarct with no ischemia.  [name] NM MYOVIEW LTD  11/24/2010   ejection fraction 58%,left ventricle is normal size ,no evidence of inducible myocardial ischemia  . right knee arthrsoscopy  64yrs ago  . right trigger finger    . seeds placed into prostate   2005  . TEE WITHOUT CARDIOVERSION N/A 06/12/2015   Procedure: TRANSESOPHAGEAL ECHOCARDIOGRAM (TEE);  Surgeon: Sanda Klein, MD;  Location: [name];  Service: Cardiovascular: EF 55-60%.No LA or LAA thrombus. Normal Peal PAP ~33 mmHg.  [name] TOTAL KNEE ARTHROPLASTY  05/09/2012   Procedure: TOTAL KNEE ARTHROPLASTY;  Surgeon: Kathalene Frames  Mayer Camel, MD;  Location: [place];  Service: Orthopedics;  Laterality: Right;  . TRANSTHORACIC ECHOCARDIOGRAM  06/2015   EF 60-65%. Mild LA dilation. Normal PA pressures: 32 mmHg. Aortic sclerosis but no stenosis.  [name] wisdom teeth extraccted      There were no vitals filed for this visit.  Subjective Assessment - 06/20/17 1158    Subjective  Pt reports that he has been completing his HEP atleast once a day. He has had a few procedures which he states really took alot out of him.     Pertinent History  persistent A-fib, history of MI, HLD, exertional dyspnea and fatigue, HTN    Patient Stated Goals  improve strength and balance     Currently in Pain?  No/denies                      [facility] Adult PT Treatment/Exercise - 06/20/17 0001      Knee/Hip Exercises: Aerobic    Nustep  L1 x7 min, end of session to encourage endurance, pt encouraged to remain above 55 SPM      Knee/Hip Exercises: Seated   Other Seated Knee/Hip Exercises  ankle DF with double red TB x15 reps each           Balance Exercises - 06/20/17 1202      Balance Exercises: Standing   Standing Eyes Opened  Foam/compliant surface;3 reps;30 secs    Standing Eyes Closed  Wide (BOA);3 reps;30 secs    Tandem Stance  Eyes open;2 reps;30 secs;Other (comment) partial tandem    Standing, One Foot on a Step  6 inch;2 reps;20 secs;Eyes open    Other Standing Exercises  alternating LE taps on 6" box x10 reps each, intermittent UE support; sidestepping Lt/Rt x30 sec (no UE support) with 15 sec break in between sets         PT Education - 06/20/17 1229    Education provided  Yes    Education Details  continued HEP adherence and benefits of this in progressing towards goals    Person(s) Educated  Patient    Methods  Explanation    Comprehension  Verbalized understanding       PT Short Term Goals - 06/20/17 1332      PT SHORT TERM GOAL #1   Title  Pt will demo consistency and independence with his HEP to improve safety with functional tasks.     Time  4    Period  Weeks    Status  Achieved      PT SHORT TERM GOAL #2   Title  The patient will have improved trunk and LE strength evident by his ability stand 3 min during a session without need for a rest break.     Time  4    Period  Weeks    Status  On-going      PT SHORT TERM GOAL #3   Title  Pt will demo improved walking tolerance up to atleast 5 min without the need for rest breaks, to increase his independence with going to the grocery store or attending church.     Time  4    Period  Weeks    Status  On-going      PT SHORT TERM GOAL #4   Title  Pt will demo improved hip flexor muscle length to atleast 0 deg, to improve his mechanics with ambulation and decrease energy expenditure.     Time  4  Period  Weeks    Status   On-going      PT SHORT TERM GOAL #5   Title  Pt will demo improved functional strength evident by his ability to complete sit to stand atleast 5 consecutive reps without the need for UE support.     Time  4    Period  Weeks    Status  On-going        PT Long Term Goals - 05/23/17 1315      PT LONG TERM GOAL #1   Title  The patient will be independent with safe self progression of HEP to improved walking distance and further conditioning at discharge.    Time  8    Period  Weeks    Status  New    Target Date  07/18/17      PT LONG TERM GOAL #2   Title  Pt will demo improved LE strenth and steadiness with stairs, evident by his ability to ascend/descend clinic steps with reciprocal pattern and without assistance, 2/3 trials.     Time  8    Period  Weeks    Status  New      PT LONG TERM GOAL #3   Title  Pt will complete 5x sit to stand in less than 14 sec with minimal UE support, to reflect an improvement in his functional strength and power.    Time  8    Period  Weeks    Status  New      PT LONG TERM GOAL #4   Title  Pt will demo improved berg balance score of atleast 8 points, to reflect improvements in balance and decrease risk of falling.     Time  8    Period  Weeks    Status  New      PT LONG TERM GOAL #5   Title  Pt will demo improved mobility and balance, evident by her ability to complete the TUG in atleast 13 sec with LRAD.     Time  8    Period  Weeks    Status  New            Plan - 06/20/17 1230    Clinical Impression Statement  Today's session, pt reported more consistent HEP adherence. Therapist was able to focus on balance activity primarily this session, noting pt was able to complete partial tandem hold for increased time (30 sec) without LOB or significant difficulty compared to previous sessions. Pt fatigues easily and required intermittent rest breaks during the session. He will continue to benefit from skilled PT to address weakness, impaired  flexibility and poor balance. Pt was encouraged to complete HEP later today and he verbalized agreement.     Rehab Potential  Good    Clinical Impairments Affecting Rehab Potential  (+) pt seems highly organized, (-) seldomly active at home    PT Frequency  2x / week    PT Duration  8 weeks    PT Treatment/Interventions  ADLs/Self Care Home Management;Moist Heat;Cryotherapy;Balance training;Therapeutic exercise;Therapeutic activities;Functional mobility training;Stair training;Gait training;Neuromuscular re-education;Patient/family education;Manual techniques;Passive range of motion;Dry needling    PT Next Visit Plan  conitnue to encourage HEP adherence; ankle strengthening; progression of LE strength, balance activity    Consulted and Agree with Plan of Care  Patient       Patient will benefit from skilled therapeutic intervention in order to improve the following deficits and impairments:  Abnormal gait, Decreased activity tolerance,  Decreased balance, Decreased safety awareness, Difficulty walking, Impaired flexibility, Obesity, Hypomobility, Decreased strength, Decreased range of motion, Decreased endurance, Decreased mobility, Postural dysfunction, Improper body mechanics  Visit Diagnosis: Muscle weakness (generalized)  Other abnormalities of gait and mobility  Unsteadiness on feet     Problem List Patient Active Problem List   Diagnosis Date Noted  . Paroxysmal (Persistent) atrial fibrillation ([facility]) -- CHA2DS2-VASc Score; On Eliquis 10/01/2015  . Persistent atrial fibrillation ([place]):  CHA2DS2-VASc Score 4   . Exertional dyspnea and fatigue 08/13/2013  . CAD S/P percutaneous coronary angioplasty   . Hyperlipidemia with target LDL less than 70   . Essential hypertension   . Obesity (BMI 30.0-34.9)   . Myocardial infarction; history of   . Osteoarthritis of right knee 05/11/2012    1:34 PM,06/20/17 Sherol Dade PT, DPT [facility] [address] [place], [place], [address] Phone: [phone]   Fax:  [phone]  Name: Jerry [name] MRN: [id number] Date of Birth: 07-May-1930

## 2017-06-20 NOTE — Telephone Encounter (Signed)
Spoke to pt about returning for labs magnesium level. Pt stated he just had dental surgery and would come next week. 06-27-17

## 2017-06-23 ENCOUNTER — Encounter: Payer: Medicare Other | Admitting: Physical Therapy

## 2017-06-23 ENCOUNTER — Ambulatory Visit: Payer: Medicare Other | Admitting: Physical Therapy

## 2017-06-23 ENCOUNTER — Encounter: Payer: Self-pay | Admitting: Physical Therapy

## 2017-06-23 DIAGNOSIS — M6281 Muscle weakness (generalized): Secondary | ICD-10-CM

## 2017-06-23 DIAGNOSIS — R2681 Unsteadiness on feet: Secondary | ICD-10-CM | POA: Diagnosis not present

## 2017-06-23 DIAGNOSIS — R2689 Other abnormalities of gait and mobility: Secondary | ICD-10-CM

## 2017-06-23 NOTE — Therapy (Signed)
[name] [address] [place], [place], [address] Phone: [phone]   Fax:  [phone]  Physical Therapy Treatment  Patient Details  Name: Jerry [name] MRN: [id number] Date of Birth: January 15, 1930 Referring Provider: [name], MD    Encounter Date: 06/23/2017  PT End of Session - 06/23/17 1111    Visit Number  5    Number of Visits  17    Date for PT Re-Evaluation  07/18/17    Authorization Type  Medicare A and B    Authorization Time Period  1218/19 to 07/18/17    Authorization - Visit Number  5    Authorization - Number of Visits  10    PT Start Time  1100    PT Stop Time  7893    PT Time Calculation (min)  42 min    Activity Tolerance  No increased pain;Patient limited by fatigue    Behavior During Therapy  [name] for tasks assessed/performed       Past Medical History:  Diagnosis Date  . Anxiety   . CAD S/P percutaneous coronary angioplasty 1996, 2009, 01/2010   PTCA of L Cx 1996; BMS-RCA Jerry [name]) 2009; Staged PCI RCA (70% ISR) --> 3.0 mm x 23 mm BMS, staged PCI Diag - 2.0 mm x 12 mm Mini Vision BMS (2.25 mm)  . Cataracts, bilateral    immature  . Chronic back pain    scoliosis--pt states unable to have surgery bc of age  . GERD (gastroesophageal reflux disease)    takes Nexium daily  . History of colon polyps   . Hyperlipidemia   . Hypertension   . Myocardial infarction [name]) 1996, 2009   x 2;last time was about 8-64yrs ago   . Obesity (BMI 30.0-34.9)   . OSA (obstructive sleep apnea)   . Osteoarthritis of both knees   . Peripheral edema   . Persistent atrial fibrillation ([facility] [name])   . Prostate cancer (Jerry [name]) 2005   seed implant    Past Surgical History:  Procedure Laterality Date  . CARDIOVERSION N/A 06/12/2015   Procedure: CARDIOVERSION;  Surgeon: Jerry Klein, MD;  Location: [name];  Service: Cardiovascular;  Laterality: N/A;  . CARDIOVERSION N/A 08/02/2015   Procedure:  CARDIOVERSION;  Surgeon: Jerry Grayer, MD;  Location: [name];  Service: Cardiovascular;  Laterality: N/A;  . CHOLECYSTECTOMY    . COLONOSCOPY    . CORONARY ANGIOPLASTY WITH STENT PLACEMENT  1996, 2009, August 2011   PTCA of L Cx 1996; BMS-RCA [facility] [name]) 2009; Staged PCI RCA (70% ISR) --> 3.0 mm x 23 mm BMS, staged PCI Diag - 2.0 mm x 12 mm Mini Vision BMS (2.25 mm)  . cyst removed  in college  . NEUROPLASTY / TRANSPOSITION MEDIAN NERVE AT CARPAL TUNNEL BILATERAL    . NM MYOVIEW LTD  November 2013   EF 53%; fixed mid inferior-inferolateral defect, infarct with no ischemia.  [name] NM MYOVIEW LTD  11/24/2010   ejection fraction 58%,left ventricle is normal size ,no evidence of inducible myocardial ischemia  . right knee arthrsoscopy  23yrs ago  . right trigger finger    . seeds placed into prostate   2005  . TEE WITHOUT CARDIOVERSION N/A 06/12/2015   Procedure: TRANSESOPHAGEAL ECHOCARDIOGRAM (TEE);  Surgeon: Jerry Klein, MD;  Location: [name];  Service: Cardiovascular: EF 55-60%.No LA or LAA thrombus. Normal Peal PAP ~33 mmHg.  [name] TOTAL KNEE ARTHROPLASTY  05/09/2012   Procedure: TOTAL KNEE ARTHROPLASTY;  Surgeon: Jerry Frames  Mayer Camel, MD;  Location: [name];  Service: Orthopedics;  Laterality: Right;  . TRANSTHORACIC ECHOCARDIOGRAM  06/2015   EF 60-65%. Mild LA dilation. Normal PA pressures: 32 mmHg. Aortic sclerosis but no stenosis.  [name] wisdom teeth extraccted      There were no vitals filed for this visit.  Subjective Assessment - 06/23/17 1101    Subjective  Pt reports that he had another busy weekend. He was only able to complete his HEP 1x/week.     Pertinent History  persistent A-fib, history of MI, HLD, exertional dyspnea and fatigue, HTN    Patient Stated Goals  improve strength and balance     Currently in Pain?  No/denies                      [name] Adult PT Treatment/Exercise - 06/23/17 0001      Knee/Hip Exercises: Seated   Long Arc Quad  Both;Strengthening;15  reps;Weights    Long Arc Quad Weight  5 lbs.    Other Seated Knee/Hip Exercises  seated low rows with green TB 2x10 reps     Hamstring Curl  Both;2 sets;Strengthening;10 reps    Hamstring Limitations  double green TB       Knee/Hip Exercises: Supine   Heel Slides  Both;1 set;15 reps;Strengthening reclined on wedge           Balance Exercises - 06/23/17 1145      Balance Exercises: Standing   Standing Eyes Opened  Foam/compliant surface;Narrow base of support (BOS) trunk rotation/change of direction    Standing Eyes Closed  Narrow base of support (BOS);Foam/compliant surface;2 reps;30 secs    Tandem Stance  Eyes closed;2 reps;30 secs        PT Education - 06/23/17 1108    Education provided  Yes    Education Details  upcoming appointment changes; conitnued HEP adherence atleast 1x/week and beginning regular walking program     Person(s) Educated  Patient    Methods  Explanation    Comprehension  Verbalized understanding       PT Short Term Goals - 06/20/17 1332      PT SHORT TERM GOAL #1   Title  Pt will demo consistency and independence with his HEP to improve safety with functional tasks.     Time  4    Period  Weeks    Status  Achieved      PT SHORT TERM GOAL #2   Title  The patient will have improved trunk and LE strength evident by his ability stand 3 min during a session without need for a rest break.     Time  4    Period  Weeks    Status  On-going      PT SHORT TERM GOAL #3   Title  Pt will demo improved walking tolerance up to atleast 5 min without the need for rest breaks, to increase his independence with going to the grocery store or attending church.     Time  4    Period  Weeks    Status  On-going      PT SHORT TERM GOAL #4   Title  Pt will demo improved hip flexor muscle length to atleast 0 deg, to improve his mechanics with ambulation and decrease energy expenditure.     Time  4    Period  Weeks    Status  On-going      PT SHORT TERM GOAL #5  Title  Pt will demo improved functional strength evident by his ability to complete sit to stand atleast 5 consecutive reps without the need for UE support.     Time  4    Period  Weeks    Status  On-going        PT Long Term Goals - 05/23/17 1315      PT LONG TERM GOAL #1   Title  The patient will be independent with safe self progression of HEP to improved walking distance and further conditioning at discharge.    Time  8    Period  Weeks    Status  New    Target Date  07/18/17      PT LONG TERM GOAL #2   Title  Pt will demo improved LE strenth and steadiness with stairs, evident by his ability to ascend/descend clinic steps with reciprocal pattern and without assistance, 2/3 trials.     Time  8    Period  Weeks    Status  New      PT LONG TERM GOAL #3   Title  Pt will complete 5x sit to stand in less than 14 sec with minimal UE support, to reflect an improvement in his functional strength and power.    Time  8    Period  Weeks    Status  New      PT LONG TERM GOAL #4   Title  Pt will demo improved berg balance score of atleast 8 points, to reflect improvements in balance and decrease risk of falling.     Time  8    Period  Weeks    Status  New      PT LONG TERM GOAL #5   Title  Pt will demo improved mobility and balance, evident by her ability to complete the TUG in atleast 13 sec with LRAD.     Time  8    Period  Weeks    Status  New            Plan - 06/23/17 1113    Clinical Impression Statement  Pt continues to report consistent HEP adherence aside from his walking. Therapist was able to review a walking program for him to complete at home for improved endurance/stamina. Pt verbalized understanding of this. Otherwise, continued with progressions of LE strengthening and balance activity with intermittent rest breaks provided. Also introduced postural strengthening this session to improve standing and sitting alignment during daily activity.     Rehab  Potential  Good    Clinical Impairments Affecting Rehab Potential  (+) pt seems highly organized, (-) seldomly active at home    PT Frequency  2x / week    PT Duration  8 weeks    PT Treatment/Interventions  ADLs/Self Care Home Management;Moist Heat;Cryotherapy;Balance training;Therapeutic exercise;Therapeutic activities;Functional mobility training;Stair training;Gait training;Neuromuscular re-education;Patient/family education;Manual techniques;Passive range of motion;Dry needling    PT Next Visit Plan  f/u on walking program (2 min); ankle strengthening; progression of LE strength, balance activity; add postural strengthening next session    Consulted and Agree with Plan of Care  Patient       Patient will benefit from skilled therapeutic intervention in order to improve the following deficits and impairments:  Abnormal gait, Decreased activity tolerance, Decreased balance, Decreased safety awareness, Difficulty walking, Impaired flexibility, Obesity, Hypomobility, Decreased strength, Decreased range of motion, Decreased endurance, Decreased mobility, Postural dysfunction, Improper body mechanics  Visit Diagnosis: Muscle weakness (generalized)  Other  abnormalities of gait and mobility  Unsteadiness on feet     Problem List Patient Active Problem List   Diagnosis Date Noted  . Paroxysmal (Persistent) atrial fibrillation ([facility]) -- CHA2DS2-VASc Score; On Eliquis 10/01/2015  . Persistent atrial fibrillation ([place]):  CHA2DS2-VASc Score 4   . Exertional dyspnea and fatigue 08/13/2013  . CAD S/P percutaneous coronary angioplasty   . Hyperlipidemia with target LDL less than 70   . Essential hypertension   . Obesity (BMI 30.0-34.9)   . Myocardial infarction; history of   . Osteoarthritis of right knee 05/11/2012    11:54 AM,06/23/17 Sherol Dade PT, DPT [place] [name] [address], [place], [place], [address] Phone: [phone]   Fax:  [phone]  Name: [name] MRN: [id number] Date of Birth: 02/01/1930

## 2017-06-26 ENCOUNTER — Encounter: Payer: Self-pay | Admitting: Physical Therapy

## 2017-06-26 ENCOUNTER — Ambulatory Visit: Payer: Medicare Other | Admitting: Physical Therapy

## 2017-06-26 DIAGNOSIS — M6281 Muscle weakness (generalized): Secondary | ICD-10-CM | POA: Diagnosis not present

## 2017-06-26 DIAGNOSIS — R2689 Other abnormalities of gait and mobility: Secondary | ICD-10-CM

## 2017-06-26 DIAGNOSIS — R2681 Unsteadiness on feet: Secondary | ICD-10-CM

## 2017-06-26 NOTE — Therapy (Signed)
[facility] [address] [place], [place], [address] Phone: [phone]   Fax:  [phone]  Physical Therapy Treatment  Patient Details  Name: Jerry [name] MRN: [id number] Date of Birth: May 14, 1930 Referring Provider: [name], MD    Encounter Date: 06/26/2017  PT End of Session - 06/26/17 1133    Visit Number  6    Number of Visits  17    Date for PT Re-Evaluation  07/18/17    Authorization Type  Medicare A and B    Authorization Time Period  1218/19 to 07/18/17    Authorization - Visit Number  6    Authorization - Number of Visits  10    PT Start Time  1102    PT Stop Time  1142    PT Time Calculation (min)  40 min    Activity Tolerance  No increased pain;Patient limited by fatigue    Behavior During Therapy  [facility] for tasks assessed/performed       Past Medical History:  Diagnosis Date  . Anxiety   . CAD S/P percutaneous coronary angioplasty 1996, 2009, 01/2010   PTCA of L Cx 1996; BMS-RCA [facility]) 2009; Staged PCI RCA (70% ISR) --> 3.0 mm x 23 mm BMS, staged PCI Diag - 2.0 mm x 12 mm Mini Vision BMS (2.25 mm)  . Cataracts, bilateral    immature  . Chronic back pain    scoliosis--pt states unable to have surgery bc of age  . GERD (gastroesophageal reflux disease)    takes Nexium daily  . History of colon polyps   . Hyperlipidemia   . Hypertension   . Myocardial infarction [facility]) 1996, 2009   x 2;last time was about 8-109yrs ago   . Obesity (BMI 30.0-34.9)   . OSA (obstructive sleep apnea)   . Osteoarthritis of both knees   . Peripheral edema   . Persistent atrial fibrillation ([place])   . Prostate cancer ([place]) 2005   seed implant    Past Surgical History:  Procedure Laterality Date  . CARDIOVERSION N/A 06/12/2015   Procedure: CARDIOVERSION;  Surgeon: Sanda Klein, MD;  Location: [place] [facility];  Service: Cardiovascular;  Laterality: N/A;  . CARDIOVERSION N/A 08/02/2015   Procedure:  CARDIOVERSION;  Surgeon: [name], MD;  Location: [place];  Service: Cardiovascular;  Laterality: N/A;  . CHOLECYSTECTOMY    . COLONOSCOPY    . CORONARY ANGIOPLASTY WITH STENT PLACEMENT  1996, 2009, August 2011   PTCA of L Cx 1996; BMS-RCA [facility]) 2009; Staged PCI RCA (70% ISR) --> 3.0 mm x 23 mm BMS, staged PCI Diag - 2.0 mm x 12 mm Mini Vision BMS (2.25 mm)  . cyst removed  in college  . NEUROPLASTY / TRANSPOSITION MEDIAN NERVE AT CARPAL TUNNEL BILATERAL    . NM MYOVIEW LTD  November 2013   EF 53%; fixed mid inferior-inferolateral defect, infarct with no ischemia.  [name] NM MYOVIEW LTD  11/24/2010   ejection fraction 58%,left ventricle is normal size ,no evidence of inducible myocardial ischemia  . right knee arthrsoscopy  62yrs ago  . right trigger finger    . seeds placed into prostate   2005  . TEE WITHOUT CARDIOVERSION N/A 06/12/2015   Procedure: TRANSESOPHAGEAL ECHOCARDIOGRAM (TEE);  Surgeon: Sanda Klein, MD;  Location: [facility]COPY;  Service: Cardiovascular: EF 55-60%.No LA or LAA thrombus. Normal Peal PAP ~33 mmHg.  [name] TOTAL KNEE ARTHROPLASTY  05/09/2012   Procedure: TOTAL KNEE ARTHROPLASTY;  Surgeon: Kathalene Frames  Mayer Camel, MD;  Location: [place];  Service: Orthopedics;  Laterality: Right;  . TRANSTHORACIC ECHOCARDIOGRAM  06/2015   EF 60-65%. Mild LA dilation. Normal PA pressures: 32 mmHg. Aortic sclerosis but no stenosis.  [name] wisdom teeth extraccted      There were no vitals filed for this visit.  Subjective Assessment - 06/26/17 1105    Subjective  Pt reports that he was not able to do much of his walking over the weekend, but he did complete his HEP regularly.     Pertinent History  persistent A-fib, history of MI, HLD, exertional dyspnea and fatigue, HTN    Patient Stated Goals  improve strength and balance     Currently in Pain?  No/denies                [facility] Adult PT Treatment/Exercise - 06/26/17 0001      Knee/Hip Exercises: Seated   Other Seated Knee/Hip Exercises   seated low rows with green TB 2x15 reps     Other Seated Knee/Hip Exercises  ankle inversion/eversion x15 reps each with red TB, ankle PF x20 reps each LE with red TB           Balance Exercises - 06/26/17 1125      Balance Exercises: Standing   Standing, One Foot on a Step  Eyes open;6 inch 2x5 reps, LE tap only       Balance Exercises: Seated   Dynamic Sitting  Eyes opened;Reaching outside base of support;Lateral weight shift;Other (comment) cone reaches x10 reps each side; x2 sets         PT Education - 06/26/17 1150    Education provided  Yes    Education Details  importance of the walking program    Person(s) Educated  Patient    Methods  Explanation    Comprehension  Verbalized understanding       PT Short Term Goals - 06/20/17 1332      PT SHORT TERM GOAL #1   Title  Pt will demo consistency and independence with his HEP to improve safety with functional tasks.     Time  4    Period  Weeks    Status  Achieved      PT SHORT TERM GOAL #2   Title  The patient will have improved trunk and LE strength evident by his ability stand 3 min during a session without need for a rest break.     Time  4    Period  Weeks    Status  On-going      PT SHORT TERM GOAL #3   Title  Pt will demo improved walking tolerance up to atleast 5 min without the need for rest breaks, to increase his independence with going to the grocery store or attending church.     Time  4    Period  Weeks    Status  On-going      PT SHORT TERM GOAL #4   Title  Pt will demo improved hip flexor muscle length to atleast 0 deg, to improve his mechanics with ambulation and decrease energy expenditure.     Time  4    Period  Weeks    Status  On-going      PT SHORT TERM GOAL #5   Title  Pt will demo improved functional strength evident by his ability to complete sit to stand atleast 5 consecutive reps without the need for UE support.     Time  4    Period  Weeks    Status  On-going        PT Long  Term Goals - 05/23/17 1315      PT LONG TERM GOAL #1   Title  The patient will be independent with safe self progression of HEP to improved walking distance and further conditioning at discharge.    Time  8    Period  Weeks    Status  New    Target Date  07/18/17      PT LONG TERM GOAL #2   Title  Pt will demo improved LE strenth and steadiness with stairs, evident by his ability to ascend/descend clinic steps with reciprocal pattern and without assistance, 2/3 trials.     Time  8    Period  Weeks    Status  New      PT LONG TERM GOAL #3   Title  Pt will complete 5x sit to stand in less than 14 sec with minimal UE support, to reflect an improvement in his functional strength and power.    Time  8    Period  Weeks    Status  New      PT LONG TERM GOAL #4   Title  Pt will demo improved berg balance score of atleast 8 points, to reflect improvements in balance and decrease risk of falling.     Time  8    Period  Weeks    Status  New      PT LONG TERM GOAL #5   Title  Pt will demo improved mobility and balance, evident by her ability to complete the TUG in atleast 13 sec with LRAD.     Time  8    Period  Weeks    Status  New            Plan - 06/26/17 1133    Clinical Impression Statement  Today's session focused on LE strengthening and seated/standing balance activity. Pt continues to require rest breaks with standing activity, however he was able to complete single leg taps on 6 inch box without LOB this session. Pt was encouraged to complete his walking more consistently at home in order to progress his stamina and ability to complete more standing activity during sessions. He verbalized understanding of this. Mild fatigue reported end of session.     Rehab Potential  Good    Clinical Impairments Affecting Rehab Potential  (+) pt seems highly organized, (-) seldomly active at home    PT Frequency  2x / week    PT Duration  8 weeks    PT Treatment/Interventions  ADLs/Self  Care Home Management;Moist Heat;Cryotherapy;Balance training;Therapeutic exercise;Therapeutic activities;Functional mobility training;Stair training;Gait training;Neuromuscular re-education;Patient/family education;Manual techniques;Passive range of motion;Dry needling    PT Next Visit Plan  f/u on walking program (2 min); LE strengthening progression, balance activity; add postural strengthening    Consulted and Agree with Plan of Care  Patient       Patient will benefit from skilled therapeutic intervention in order to improve the following deficits and impairments:  Abnormal gait, Decreased activity tolerance, Decreased balance, Decreased safety awareness, Difficulty walking, Impaired flexibility, Obesity, Hypomobility, Decreased strength, Decreased range of motion, Decreased endurance, Decreased mobility, Postural dysfunction, Improper body mechanics  Visit Diagnosis: Muscle weakness (generalized)  Other abnormalities of gait and mobility  Unsteadiness on feet     Problem List Patient Active Problem List   Diagnosis Date Noted  . Paroxysmal (  Persistent) atrial fibrillation (HCC) -- CHA2DS2-VASc Score; On Eliquis 10/01/2015  . Persistent atrial fibrillation ([place]):  CHA2DS2-VASc Score 4   . Exertional dyspnea and fatigue 08/13/2013  . CAD S/P percutaneous coronary angioplasty   . Hyperlipidemia with target LDL less than 70   . Essential hypertension   . Obesity (BMI 30.0-34.9)   . Myocardial infarction; history of   . Osteoarthritis of right knee 05/11/2012   11:53 AM,06/26/17 Sherol Dade PT, DPT [place] [facility] [address], [place], [place], [address] Phone: [phone]   Fax:  [phone]  Name: Jerry [name] MRN: [id number] Date of Birth: [birth year]

## 2017-06-27 ENCOUNTER — Other Ambulatory Visit: Payer: Medicare Other

## 2017-06-27 DIAGNOSIS — Z79899 Other long term (current) drug therapy: Secondary | ICD-10-CM | POA: Diagnosis not present

## 2017-06-28 LAB — MAGNESIUM: MAGNESIUM: 2.2 mg/dL (ref 1.6–2.3)

## 2017-06-30 ENCOUNTER — Encounter: Payer: Medicare Other | Admitting: Physical Therapy

## 2017-07-03 ENCOUNTER — Encounter: Payer: Self-pay | Admitting: Physical Therapy

## 2017-07-03 ENCOUNTER — Ambulatory Visit: Payer: Medicare Other | Admitting: Physical Therapy

## 2017-07-03 DIAGNOSIS — R2689 Other abnormalities of gait and mobility: Secondary | ICD-10-CM | POA: Diagnosis not present

## 2017-07-03 DIAGNOSIS — M6281 Muscle weakness (generalized): Secondary | ICD-10-CM | POA: Diagnosis not present

## 2017-07-03 DIAGNOSIS — R2681 Unsteadiness on feet: Secondary | ICD-10-CM

## 2017-07-03 NOTE — Therapy (Signed)
[facility] [address], [place], [place], [address] Phone: [phone]   Fax:  [phone]  Physical Therapy Treatment  Patient Details  Name: Jerry [name] MRN: [id number] Date of Birth: 1930-03-01 Referring Provider: [name], MD    Encounter Date: 07/03/2017  PT End of Session - 07/03/17 1139    Visit Number  7    Number of Visits  17    Date for PT Re-Evaluation  07/18/17    Authorization Type  Medicare A and B    Authorization Time Period  1218/19 to 07/18/17    Authorization - Visit Number  7    Authorization - Number of Visits  10    PT Start Time  1102    PT Stop Time  8099    PT Time Calculation (min)  43 min    Activity Tolerance  No increased pain;Patient limited by fatigue    Behavior During Therapy  [facility] for tasks assessed/performed       Past Medical History:  Diagnosis Date  . Anxiety   . CAD S/P percutaneous coronary angioplasty 1996, 2009, 01/2010   PTCA of L Cx 1996; BMS-RCA [facility]) 2009; Staged PCI RCA (70% ISR) --> 3.0 mm x 23 mm BMS, staged PCI Diag - 2.0 mm x 12 mm Mini Vision BMS (2.25 mm)  . Cataracts, bilateral    immature  . Chronic back pain    scoliosis--pt states unable to have surgery bc of age  . GERD (gastroesophageal reflux disease)    takes Nexium daily  . History of colon polyps   . Hyperlipidemia   . Hypertension   . Myocardial infarction [facility]) 1996, 2009   x 2;last time was about 8-11yrs ago   . Obesity (BMI 30.0-34.9)   . OSA (obstructive sleep apnea)   . Osteoarthritis of both knees   . Peripheral edema   . Persistent atrial fibrillation ([place])   . Prostate cancer ([place]) 2005   seed implant    Past Surgical History:  Procedure Laterality Date  . CARDIOVERSION N/A 06/12/2015   Procedure: CARDIOVERSION;  Surgeon: Sanda Klein, MD;  Location: [place] [facility];  Service: Cardiovascular;  Laterality: N/A;  . CARDIOVERSION N/A 08/02/2015   Procedure:  CARDIOVERSION;  Surgeon: [name], MD;  Location: [place];  Service: Cardiovascular;  Laterality: N/A;  . CHOLECYSTECTOMY    . COLONOSCOPY    . CORONARY ANGIOPLASTY WITH STENT PLACEMENT  1996, 2009, August 2011   PTCA of L Cx 1996; BMS-RCA [facility]) 2009; Staged PCI RCA (70% ISR) --> 3.0 mm x 23 mm BMS, staged PCI Diag - 2.0 mm x 12 mm Mini Vision BMS (2.25 mm)  . cyst removed  in college  . NEUROPLASTY / TRANSPOSITION MEDIAN NERVE AT CARPAL TUNNEL BILATERAL    . NM MYOVIEW LTD  November 2013   EF 53%; fixed mid inferior-inferolateral defect, infarct with no ischemia.  [name] NM MYOVIEW LTD  11/24/2010   ejection fraction 58%,left ventricle is normal size ,no evidence of inducible myocardial ischemia  . right knee arthrsoscopy  77yrs ago  . right trigger finger    . seeds placed into prostate   2005  . TEE WITHOUT CARDIOVERSION N/A 06/12/2015   Procedure: TRANSESOPHAGEAL ECHOCARDIOGRAM (TEE);  Surgeon: Sanda Klein, MD;  Location: [facility] ENDOSCOPY;  Service: Cardiovascular: EF 55-60%.No LA or LAA thrombus. Normal Peal PAP ~33 mmHg.  [name] TOTAL KNEE ARTHROPLASTY  05/09/2012   Procedure: TOTAL KNEE ARTHROPLASTY;  Surgeon: Kathalene Frames  Mayer Camel, MD;  Location: [place];  Service: Orthopedics;  Laterality: Right;  . TRANSTHORACIC ECHOCARDIOGRAM  06/2015   EF 60-65%. Mild LA dilation. Normal PA pressures: 32 mmHg. Aortic sclerosis but no stenosis.  [name] wisdom teeth extraccted      There were no vitals filed for this visit.  Subjective Assessment - 07/03/17 1105    Subjective  Pt reports that he has been trying to work on his exercises, but he still feels very weak. He worked some on his walking, but this was not as much as was expected of him.     Pertinent History  persistent A-fib, history of MI, HLD, exertional dyspnea and fatigue, HTN    Patient Stated Goals  improve strength and balance     Currently in Pain?  No/denies                      [facility] Adult PT Treatment/Exercise - 07/03/17 0001       Knee/Hip Exercises: Aerobic   Nustep  L3 x5 min  PT present to encourage increased SPM       Knee/Hip Exercises: Seated   Long Arc Quad  Both;Strengthening;4 sets;Limitations    Long Arc Quad Weight  5 lbs.    Long [employer] Limitations  4x30 sec rounds, 15 sec rest in between     [employer] Limitations  2# ankle weights, x30 sec round with 10 sec break in between    Sit to [place]  5 reps;3 sets;Other (comment) seated on foam pad, UE on thighs           Balance Exercises - 07/03/17 1127      Balance Exercises: Standing   Cone Rotation  Solid surface;Right turn;Left turn;Other (comment) NBOS x10 reps each, x2 trials         PT Education - 07/03/17 1139    Education provided  Yes    Education Details  importance of posture in every day activity and reaching, etc.     Person(s) Educated  Patient    Methods  Explanation    Comprehension  Verbalized understanding       PT Short Term Goals - 06/20/17 1332      PT SHORT TERM GOAL #1   Title  Pt will demo consistency and independence with his HEP to improve safety with functional tasks.     Time  4    Period  Weeks    Status  Achieved      PT SHORT TERM GOAL #2   Title  The patient will have improved trunk and LE strength evident by his ability stand 3 min during a session without need for a rest break.     Time  4    Period  Weeks    Status  On-going      PT SHORT TERM GOAL #3   Title  Pt will demo improved walking tolerance up to atleast 5 min without the need for rest breaks, to increase his independence with going to the grocery store or attending church.     Time  4    Period  Weeks    Status  On-going      PT SHORT TERM GOAL #4   Title  Pt will demo improved hip flexor muscle length to atleast 0 deg, to improve his mechanics with ambulation and decrease energy expenditure.     Time  4    Period  Weeks    Status  On-going      PT SHORT TERM GOAL #5   Title  Pt will demo improved  functional strength evident by his ability to complete sit to stand atleast 5 consecutive reps without the need for UE support.     Time  4    Period  Weeks    Status  On-going        PT Long Term Goals - 05/23/17 1315      PT LONG TERM GOAL #1   Title  The patient will be independent with safe self progression of HEP to improved walking distance and further conditioning at discharge.    Time  8    Period  Weeks    Status  New    Target Date  07/18/17      PT LONG TERM GOAL #2   Title  Pt will demo improved LE strenth and steadiness with stairs, evident by his ability to ascend/descend clinic steps with reciprocal pattern and without assistance, 2/3 trials.     Time  8    Period  Weeks    Status  New      PT LONG TERM GOAL #3   Title  Pt will complete 5x sit to stand in less than 14 sec with minimal UE support, to reflect an improvement in his functional strength and power.    Time  8    Period  Weeks    Status  New      PT LONG TERM GOAL #4   Title  Pt will demo improved berg balance score of atleast 8 points, to reflect improvements in balance and decrease risk of falling.     Time  8    Period  Weeks    Status  New      PT LONG TERM GOAL #5   Title  Pt will demo improved mobility and balance, evident by her ability to complete the TUG in atleast 13 sec with LRAD.     Time  8    Period  Weeks    Status  New            Plan - 07/03/17 1140    Clinical Impression Statement  Continued this session with exercise to improve pt's strength, stamina and endurance. Pt was able to complete increased sets in 30 sec intervals without significant fatigue, although sit to stand remains quite difficult for him to complete without UE support. Pt was encouraged to increase his HEP adherence due to recent reports of moderate adherence overall, and he verbalized agreement with this. Will continue to benefit from skilled PT to address limitations in LE strength, posture control,  endurance and balance.     Rehab Potential  Good    Clinical Impairments Affecting Rehab Potential  (+) pt seems highly organized, (-) seldomly active at home    PT Frequency  2x / week    PT Duration  8 weeks    PT Treatment/Interventions  ADLs/Self Care Home Management;Moist Heat;Cryotherapy;Balance training;Therapeutic exercise;Therapeutic activities;Functional mobility training;Stair training;Gait training;Neuromuscular re-education;Patient/family education;Manual techniques;Passive range of motion;Dry needling    PT Next Visit Plan  f/u on walking program (2 min); LE strengthening progression (sit to stand), balance activity; add postural strengthening    Consulted and Agree with Plan of Care  Patient       Patient will benefit from skilled therapeutic intervention in order to improve the following deficits and impairments:  Abnormal gait, Decreased activity  tolerance, Decreased balance, Decreased safety awareness, Difficulty walking, Impaired flexibility, Obesity, Hypomobility, Decreased strength, Decreased range of motion, Decreased endurance, Decreased mobility, Postural dysfunction, Improper body mechanics  Visit Diagnosis: Muscle weakness (generalized)  Other abnormalities of gait and mobility  Unsteadiness on feet     Problem List Patient Active Problem List   Diagnosis Date Noted  . Paroxysmal (Persistent) atrial fibrillation (HCC) -- CHA2DS2-VASc Score; On Eliquis 10/01/2015  . Persistent atrial fibrillation ([place]):  CHA2DS2-VASc Score 4   . Exertional dyspnea and fatigue 08/13/2013  . CAD S/P percutaneous coronary angioplasty   . Hyperlipidemia with target LDL less than 70   . Essential hypertension   . Obesity (BMI 30.0-34.9)   . Myocardial infarction; history of   . Osteoarthritis of right knee 05/11/2012   12:34 PM,07/03/17 Sherol Dade PT, DPT [place] [facility] [address] [place], [place], [address] Phone: [phone]   Fax:  [phone]  Name: JAELYNN CURRIER MRN: [id number] Date of Birth: January 28, 1930

## 2017-07-07 ENCOUNTER — Encounter: Payer: Self-pay | Admitting: Physical Therapy

## 2017-07-07 ENCOUNTER — Ambulatory Visit: Payer: Medicare Other | Attending: Family Medicine | Admitting: Physical Therapy

## 2017-07-07 DIAGNOSIS — R2681 Unsteadiness on feet: Secondary | ICD-10-CM | POA: Diagnosis not present

## 2017-07-07 DIAGNOSIS — R2689 Other abnormalities of gait and mobility: Secondary | ICD-10-CM | POA: Diagnosis not present

## 2017-07-07 DIAGNOSIS — M6281 Muscle weakness (generalized): Secondary | ICD-10-CM | POA: Diagnosis not present

## 2017-07-07 NOTE — Therapy (Signed)
[facility] [address], [place], [place], [address] Phone: [phone]   Fax:  [phone]  Physical Therapy Treatment  Patient Details  Name: Jerry [name] MRN: [id number] Date of Birth: 27-May-1930 Referring Provider: [name], MD    Encounter Date: 07/07/2017  PT End of Session - 07/07/17 1143    Visit Number  8    Number of Visits  17    Date for PT Re-Evaluation  07/18/17    Authorization Type  Medicare A and B    Authorization Time Period  1218/19 to 07/18/17    Authorization - Visit Number  8    Authorization - Number of Visits  10    PT Start Time  1100    PT Stop Time  6759    PT Time Calculation (min)  42 min    Activity Tolerance  No increased pain;Patient limited by fatigue    Behavior During Therapy  [facility] for tasks assessed/performed       Past Medical History:  Diagnosis Date  . Anxiety   . CAD S/P percutaneous coronary angioplasty 1996, 2009, 01/2010   PTCA of L Cx 1996; BMS-RCA [facility]) 2009; Staged PCI RCA (70% ISR) --> 3.0 mm x 23 mm BMS, staged PCI Diag - 2.0 mm x 12 mm Mini Vision BMS (2.25 mm)  . Cataracts, bilateral    immature  . Chronic back pain    scoliosis--pt states unable to have surgery bc of age  . GERD (gastroesophageal reflux disease)    takes Nexium daily  . History of colon polyps   . Hyperlipidemia   . Hypertension   . Myocardial infarction [facility]) 1996, 2009   x 2;last time was about 8-95yr ago   . Obesity (BMI 30.0-34.9)   . OSA (obstructive sleep apnea)   . Osteoarthritis of both knees   . Peripheral edema   . Persistent atrial fibrillation ([place]   . Prostate cancer ([place] 2005   seed implant    Past Surgical History:  Procedure Laterality Date  . CARDIOVERSION N/A 06/12/2015   Procedure: CARDIOVERSION;  Surgeon: MSanda Klein MD;  [facility];  Service: Cardiovascular;  Laterality: N/A;  . CARDIOVERSION N/A 08/02/2015   Procedure:  CARDIOVERSION;  Surgeon: JThompson Grayer MD;  [place]  Service: Cardiovascular;  Laterality: N/A;  . CHOLECYSTECTOMY    . COLONOSCOPY    . CORONARY ANGIOPLASTY WITH STENT PLACEMENT  1996, 2009, August 2011   PTCA of L Cx 1996; BMS-RCA ([facility] 2009; Staged PCI RCA (70% ISR) --> 3.0 mm x 23 mm BMS, staged PCI Diag - 2.0 mm x 12 mm Mini Vision BMS (2.25 mm)  . cyst removed  in college  . NEUROPLASTY / TRANSPOSITION MEDIAN NERVE AT CARPAL TUNNEL BILATERAL    . NM MYOVIEW LTD  November 2013   EF 53%; fixed mid inferior-inferolateral defect, infarct with no ischemia.  .Marland KitchenNM MYOVIEW LTD  11/24/2010   ejection fraction 58%,left ventricle is normal size ,no evidence of inducible myocardial ischemia  . right knee arthrsoscopy  163yrago  . right trigger finger    . seeds placed into prostate   2005  . TEE WITHOUT CARDIOVERSION N/A 06/12/2015   Procedure: TRANSESOPHAGEAL ECHOCARDIOGRAM (TEE);  Surgeon: MiSanda KleinMD;  [facility];  Service: Cardiovascular: EF 55-60%.No LA or LAA thrombus. Normal Peal PAP ~33 mmHg.  . Marland KitchenOTAL KNEE ARTHROPLASTY  05/09/2012   Procedure: TOTAL KNEE ARTHROPLASTY;  Surgeon: FrKathalene Frames  Mayer Camel, MD;  Location: [place];  Service: Orthopedics;  Laterality: Right;  . TRANSTHORACIC ECHOCARDIOGRAM  06/2015   EF 60-65%. Mild LA dilation. Normal PA pressures: 32 mmHg. Aortic sclerosis but no stenosis.  [name] wisdom teeth extraccted      There were no vitals filed for this visit.  Subjective Assessment - 07/07/17 1104    Subjective  Pt reports that he has been trying to work on his walking and exercises at home. He feels he is moving a little slow today, but as no other complaints.     Pertinent History  persistent A-fib, history of MI, HLD, exertional dyspnea and fatigue, HTN    Patient Stated Goals  improve strength and balance     Currently in Pain?  No/denies                      [facility] Adult PT Treatment/Exercise - 07/07/17 0001      Knee/Hip  Exercises: Aerobic   Nustep  Intervals from L1 to L3 every 1 min, x6 min.  PT present to encourage pt above 60 SPM       Knee/Hip Exercises: Standing   Other Standing Knee Exercises  sidestepping along countertop Lt/Rt 2x3 rounds with UE support, therapist encouraging forward position     Other Standing Knee Exercises  alternating marching 4x10 reps with 1 UE support only and 1 rest break in between       Knee/Hip Exercises: Seated   Long Arc Quad  2 sets;20 reps;Weights;Both    Long Arc Quad Weight  5 lbs.    Sit to Sand  Other (comment);3 sets;5 reps UE on thighs           Balance Exercises - 07/07/17 1135      Balance Exercises: Standing   Standing Eyes Opened  Narrow base of support (BOS);Head turns;Other (comment) x10 reps Lt/Rt    Tandem Stance  Eyes open;30 secs;2 reps        PT Education - 07/07/17 1141    Education provided  Yes    Education Details  noted improvements in standing/activity completion during his sessions     Person(s) Educated  Patient    Methods  Explanation    Comprehension  Verbalized understanding       PT Short Term Goals - 07/07/17 1112      PT SHORT TERM GOAL #1   Title  Pt will demo consistency and independence with his HEP to improve safety with functional tasks.     Time  4    Period  Weeks    Status  Achieved      PT SHORT TERM GOAL #2   Title  The patient will have improved trunk and LE strength evident by his ability stand 3 min during a session without need for a rest break.     Baseline  during balance activity up to 2 min    Time  4    Period  Weeks    Status  On-going      PT SHORT TERM GOAL #3   Title  Pt will demo improved walking tolerance up to atleast 5 min without the need for rest breaks, to increase his independence with going to the grocery store or attending church.     Time  4    Period  Weeks    Status  On-going      PT SHORT TERM GOAL #4   Title  Pt will  demo improved hip flexor muscle length to atleast 0  deg, to improve his mechanics with ambulation and decrease energy expenditure.     Time  4    Period  Weeks    Status  On-going      PT SHORT TERM GOAL #5   Title  Pt will demo improved functional strength evident by his ability to complete sit to stand atleast 5 consecutive reps without the need for UE support.     Baseline  UE support on thighs     Time  4    Period  Weeks    Status  Partially Met        PT Long Term Goals - 05/23/17 1315      PT LONG TERM GOAL #1   Title  The patient will be independent with safe self progression of HEP to improved walking distance and further conditioning at discharge.    Time  8    Period  Weeks    Status  New    Target Date  07/18/17      PT LONG TERM GOAL #2   Title  Pt will demo improved LE strenth and steadiness with stairs, evident by his ability to ascend/descend clinic steps with reciprocal pattern and without assistance, 2/3 trials.     Time  8    Period  Weeks    Status  New      PT LONG TERM GOAL #3   Title  Pt will complete 5x sit to stand in less than 14 sec with minimal UE support, to reflect an improvement in his functional strength and power.    Time  8    Period  Weeks    Status  New      PT LONG TERM GOAL #4   Title  Pt will demo improved berg balance score of atleast 8 points, to reflect improvements in balance and decrease risk of falling.     Time  8    Period  Weeks    Status  New      PT LONG TERM GOAL #5   Title  Pt will demo improved mobility and balance, evident by her ability to complete the TUG in atleast 13 sec with LRAD.     Time  8    Period  Weeks    Status  New            Plan - 07/07/17 1144    Clinical Impression Statement  Pt continues to complete his HEP as much as possible at home. He is making slow but steady progress towards his goals, demonstrating improved endurance and stamina during today's session with more motivation to complete activity with fewer rest breaks. He was able to  maintain partial tandem stance with eyes closed for 10-15 sec at a time this session. Will continue to work on activity and therex to improve pt's strength, endurance and balance. Ended session without any complaints.     Rehab Potential  Good    Clinical Impairments Affecting Rehab Potential  (+) pt seems highly organized, (-) seldomly active at home    PT Frequency  2x / week    PT Duration  8 weeks    PT Treatment/Interventions  ADLs/Self Care Home Management;Moist Heat;Cryotherapy;Balance training;Therapeutic exercise;Therapeutic activities;Functional mobility training;Stair training;Gait training;Neuromuscular re-education;Patient/family education;Manual techniques;Passive range of motion;Dry needling    PT Next Visit Plan  LE strengthening progression (sit to stand), standing marches, alt taps, balance activity; add postural  strengthening    Consulted and Agree with Plan of Care  Patient       Patient will benefit from skilled therapeutic intervention in order to improve the following deficits and impairments:  Abnormal gait, Decreased activity tolerance, Decreased balance, Decreased safety awareness, Difficulty walking, Impaired flexibility, Obesity, Hypomobility, Decreased strength, Decreased range of motion, Decreased endurance, Decreased mobility, Postural dysfunction, Improper body mechanics  Visit Diagnosis: Muscle weakness (generalized)  Other abnormalities of gait and mobility  Unsteadiness on feet     Problem List Patient Active Problem List   Diagnosis Date Noted  . Paroxysmal (Persistent) atrial fibrillation (HCC) -- CHA2DS2-VASc Score; On Eliquis 10/01/2015  . Persistent atrial fibrillation ([place]):  CHA2DS2-VASc Score 4   . Exertional dyspnea and fatigue 08/13/2013  . CAD S/P percutaneous coronary angioplasty   . Hyperlipidemia with target LDL less than 70   . Essential hypertension   . Obesity (BMI 30.0-34.9)   . Myocardial infarction; history of   . Osteoarthritis  of right knee 05/11/2012    11:47 AM,07/07/17 Sherol Dade PT, DPT [place] [facility] [address], [place], [place], [address] Phone: [phone]   Fax:  [phone]  Name: Jerry [name] MRN: [id number] Date of Birth: [birth year]

## 2017-07-10 ENCOUNTER — Other Ambulatory Visit: Payer: Self-pay | Admitting: Cardiology

## 2017-07-10 ENCOUNTER — Encounter: Payer: Self-pay | Admitting: Physical Therapy

## 2017-07-10 ENCOUNTER — Ambulatory Visit: Payer: Medicare Other | Admitting: Physical Therapy

## 2017-07-10 DIAGNOSIS — R2689 Other abnormalities of gait and mobility: Secondary | ICD-10-CM | POA: Diagnosis not present

## 2017-07-10 DIAGNOSIS — R2681 Unsteadiness on feet: Secondary | ICD-10-CM | POA: Diagnosis not present

## 2017-07-10 DIAGNOSIS — M6281 Muscle weakness (generalized): Secondary | ICD-10-CM

## 2017-07-10 NOTE — Therapy (Signed)
[facility] [address] [place] , [place], [address] Phone: [phone]   Fax:  [phone]  Physical Therapy Treatment  Patient Details  Name: Jerry [name] MRN: [id number] Date of Birth: [birth year] Referring Provider: [name], MD    Encounter Date: 07/10/2017  PT End of Session - 07/10/17 1118    Visit Number  9    Number of Visits  17    Date for PT Re-Evaluation  07/18/17    Authorization Type  Medicare A and B    Authorization Time Period  1218/19 to 07/18/17    Authorization - Visit Number  9    Authorization - Number of Visits  10    PT Start Time  1100    PT Stop Time  1140    PT Time Calculation (min)  40 min    Activity Tolerance  No increased pain;Patient limited by fatigue    Behavior During Therapy  [facility] for tasks assessed/performed       Past Medical History:  Diagnosis Date  . Anxiety   . CAD S/P percutaneous coronary angioplasty 1996, 2009, 01/2010   PTCA of L Cx 1996; BMS-RCA [facility]) 2009; Staged PCI RCA (70% ISR) --> 3.0 mm x 23 mm BMS, staged PCI Diag - 2.0 mm x 12 mm Mini Vision BMS (2.25 mm)  . Cataracts, bilateral    immature  . Chronic back pain    scoliosis--pt states unable to have surgery bc of age  . GERD (gastroesophageal reflux disease)    takes Nexium daily  . History of colon polyps   . Hyperlipidemia   . Hypertension   . Myocardial infarction [facility]) 1996, 2009   x 2;last time was about 8-78yr ago   . Obesity (BMI 30.0-34.9)   . OSA (obstructive sleep apnea)   . Osteoarthritis of both knees   . Peripheral edema   . Persistent atrial fibrillation ([place]   . [place]   seed implant    Past Surgical History:  Procedure Laterality Date  . CARDIOVERSION N/A 06/12/2015   Procedure: CARDIOVERSION;  Surgeon: MSanda Klein MD;  [facility];  Service: Cardiovascular;  Laterality: N/A;  . CARDIOVERSION N/A 08/02/2015   Procedure:  CARDIOVERSION;  Surgeon: JThompson Grayer MD;  [facility] [facility]  Service: Cardiovascular;  Laterality: N/A;  . CHOLECYSTECTOMY    . COLONOSCOPY    . CORONARY ANGIOPLASTY WITH STENT PLACEMENT  1996, 2009, August 2011   PTCA of L Cx 1996; BMS-RCA ([facility] 2009; Staged PCI RCA (70% ISR) --> 3.0 mm x 23 mm BMS, staged PCI Diag - 2.0 mm x 12 mm Mini Vision BMS (2.25 mm)  . cyst removed  in college  . NEUROPLASTY / TRANSPOSITION MEDIAN NERVE AT CARPAL TUNNEL BILATERAL    . NM MYOVIEW LTD  November 2013   EF 53%; fixed mid inferior-inferolateral defect, infarct with no ischemia.  .Marland KitchenNM MYOVIEW LTD  11/24/2010   ejection fraction 58%,left ventricle is normal size ,no evidence of inducible myocardial ischemia  . right knee arthrsoscopy  [address]  . right trigger finger    . seeds placed into prostate   2005  . TEE WITHOUT CARDIOVERSION N/A 06/12/2015   Procedure: TRANSESOPHAGEAL ECHOCARDIOGRAM (TEE);  Surgeon: MiSanda KleinMD;  [facility];  Service: Cardiovascular: EF 55-60%.No LA or LAA thrombus. Normal Peal PAP ~33 mmHg.  . Marland KitchenOTAL KNEE ARTHROPLASTY  05/09/2012   Procedure: TOTAL KNEE ARTHROPLASTY;  Surgeon: FrKathalene Frames  Mayer Camel, MD;  Location: [place];  Service: Orthopedics;  Laterality: Right;  . TRANSTHORACIC ECHOCARDIOGRAM  06/2015   EF 60-65%. Mild LA dilation. Normal PA pressures: 32 mmHg. Aortic sclerosis but no stenosis.  [name] wisdom teeth extraccted      There were no vitals filed for this visit.  Subjective Assessment - 07/10/17 1105    Subjective  Pt reports that he was ok after his last session. He feels "particularly" weak this session, but is noticing this is all the time.     Pertinent History  persistent A-fib, history of MI, HLD, exertional dyspnea and fatigue, HTN    Patient Stated Goals  improve strength and balance     Currently in Pain?  No/denies                      [facility] Adult PT Treatment/Exercise - 07/10/17 0001      Knee/Hip Exercises: Aerobic   Nustep   Intervals from L2 to L4 every 1 min, x6 min.  PT present to adjust resistance and encourage SPM above 65      Knee/Hip Exercises: Standing   Other Standing Knee Exercises  sidestepping along countertop Lt/Rt 2x3 rounds with UE support and yellow TB around ankles; 1 rest break between sets    Other Standing Knee Exercises  cone stacking activity with variations of UE support and weight shifting x4 min without seated rest break       Knee/Hip Exercises: Seated   Sit to Sand  5 reps;Other (comment) x4 sets, UE on thighs, 1 set without cushion          Balance Exercises - 07/10/17 1133      Balance Exercises: Standing   Standing Eyes Opened  Narrow base of support (BOS);Foam/compliant surface;Head turns;Other (comment) Lt/Rt, up/down x10 reps     Other Standing Exercises  NBOS on foam with scapular retraction x15 reps         PT Education - 07/10/17 1140    Education provided  Yes    Education Details  progress towards goals and additions to HEP     Person(s) Educated  Patient    Methods  Explanation;Handout    Comprehension  Verbalized understanding;Returned demonstration       PT Short Term Goals - 07/10/17 1123      PT SHORT TERM GOAL #1   Title  Pt will demo consistency and independence with his HEP to improve safety with functional tasks.     Time  4    Period  Weeks    Status  Achieved      PT SHORT TERM GOAL #2   Title  The patient will have improved trunk and LE strength evident by his ability stand 3 min during a session without need for a rest break.     Baseline  during balance activity up to 2 min    Time  4    Period  Weeks    Status  On-going      PT SHORT TERM GOAL #3   Title  Pt will demo improved walking tolerance up to atleast 5 min without the need for rest breaks, to increase his independence with going to the grocery store or attending church.     Time  4    Period  Weeks    Status  On-going      PT SHORT TERM GOAL #4   Title  Pt will demo  improved hip flexor  muscle length to atleast 0 deg, to improve his mechanics with ambulation and decrease energy expenditure.     Time  4    Period  Weeks    Status  On-going      PT SHORT TERM GOAL #5   Title  Pt will demo improved functional strength evident by his ability to complete sit to stand atleast 5 consecutive reps without the need for UE support.     Baseline  UE support on thighs without need for seat elevation    Time  4    Period  Weeks    Status  Partially Met        PT Long Term Goals - 05/23/17 1315      PT LONG TERM GOAL #1   Title  The patient will be independent with safe self progression of HEP to improved walking distance and further conditioning at discharge.    Time  8    Period  Weeks    Status  New    Target Date  07/18/17      PT LONG TERM GOAL #2   Title  Pt will demo improved LE strenth and steadiness with stairs, evident by his ability to ascend/descend clinic steps with reciprocal pattern and without assistance, 2/3 trials.     Time  8    Period  Weeks    Status  New      PT LONG TERM GOAL #3   Title  Pt will complete 5x sit to stand in less than 14 sec with minimal UE support, to reflect an improvement in his functional strength and power.    Time  8    Period  Weeks    Status  New      PT LONG TERM GOAL #4   Title  Pt will demo improved berg balance score of atleast 8 points, to reflect improvements in balance and decrease risk of falling.     Time  8    Period  Weeks    Status  New      PT LONG TERM GOAL #5   Title  Pt will demo improved mobility and balance, evident by her ability to complete the TUG in atleast 13 sec with LRAD.     Time  8    Period  Weeks    Status  New            Plan - 07/10/17 1143    Clinical Impression Statement  Pt met a short term goals this session, demonstrating improved standing endurance and able to complete activity for up to 4 minutes without a rest break. Continued with therex to promote  functional strength, adding sit to stand to his HEP, as he was able to complete for 1 set without elevated seat. Pt reported no increase in soreness or significant fatigue end of session and oxygen levels remained in the mid 90s throughout the session.     Rehab Potential  Good    Clinical Impairments Affecting Rehab Potential  (+) pt seems highly organized, (-) seldomly active at home    PT Frequency  2x / week    PT Duration  8 weeks    PT Treatment/Interventions  ADLs/Self Care Home Management;Moist Heat;Cryotherapy;Balance training;Therapeutic exercise;Therapeutic activities;Functional mobility training;Stair training;Gait training;Neuromuscular re-education;Patient/family education;Manual techniques;Passive range of motion;Dry needling    PT Next Visit Plan  f/u on sit to stand at home, standing marches, alt taps, balance activity; add postural strengthening  Consulted and Agree with Plan of Care  Patient       Patient will benefit from skilled therapeutic intervention in order to improve the following deficits and impairments:  Abnormal gait, Decreased activity tolerance, Decreased balance, Decreased safety awareness, Difficulty walking, Impaired flexibility, Obesity, Hypomobility, Decreased strength, Decreased range of motion, Decreased endurance, Decreased mobility, Postural dysfunction, Improper body mechanics  Visit Diagnosis: Muscle weakness (generalized)  Other abnormalities of gait and mobility  Unsteadiness on feet     Problem List Patient Active Problem List   Diagnosis Date Noted  . Paroxysmal (Persistent) atrial fibrillation (HCC) -- CHA2DS2-VASc Score; On Eliquis 10/01/2015  . Persistent atrial fibrillation ([place]):  CHA2DS2-VASc Score 4   . Exertional dyspnea and fatigue 08/13/2013  . CAD S/P percutaneous coronary angioplasty   . Hyperlipidemia with target LDL less than 70   . Essential hypertension   . Obesity (BMI 30.0-34.9)   . Myocardial infarction; history of    . Osteoarthritis of right knee 05/11/2012    11:44 AM,07/10/17 Sherol Dade PT, DPT [place] [facility] [address] [place], [place], [address] Phone: [phone]   Fax:  [phone]  Name: Jerry [name] MRN: [id number] Date of Birth: 09/04/1929

## 2017-07-10 NOTE — Patient Instructions (Signed)
   SIT TO STAND - THIGH SUPPORT  Start by scooting close to the front of the chair.  Then lean forward and place your hands on your thighs. Rise up to standing using your hands for support.   Sit back down using your hands for support on your thighs and then repeat.      try to complete 5 reps. Do this 3x a day.    [place] [address] [place], [place] [address] Phone # [phone] Fax [phone]

## 2017-07-14 ENCOUNTER — Ambulatory Visit: Payer: Medicare Other | Admitting: Physical Therapy

## 2017-07-14 ENCOUNTER — Encounter: Payer: Self-pay | Admitting: Physical Therapy

## 2017-07-14 DIAGNOSIS — R2689 Other abnormalities of gait and mobility: Secondary | ICD-10-CM | POA: Diagnosis not present

## 2017-07-14 DIAGNOSIS — M6281 Muscle weakness (generalized): Secondary | ICD-10-CM | POA: Diagnosis not present

## 2017-07-14 DIAGNOSIS — R2681 Unsteadiness on feet: Secondary | ICD-10-CM

## 2017-07-14 NOTE — Therapy (Signed)
[facility] [address], [place], [place], [address] Phone: [phone]   Fax:  [phone]  Physical Therapy Treatment  Patient Details  Name: Jerry [name] MRN: [id number] Date of Birth: 02/21/1930 Referring Provider: [name], MD    Encounter Date: 07/14/2017  PT End of Session - 07/14/17 1148    Visit Number  10    Number of Visits  17    Date for PT Re-Evaluation  07/18/17    Authorization Type  Medicare A and B    Authorization Time Period  1218/19 to 07/18/17    Authorization - Visit Number  10    Authorization - Number of Visits  15    PT Start Time  1102    PT Stop Time  1145    PT Time Calculation (min)  43 min    Activity Tolerance  No increased pain    Behavior During Therapy  [facility] for tasks assessed/performed       Past Medical History:  Diagnosis Date  . Anxiety   . CAD S/P percutaneous coronary angioplasty 1996, 2009, 01/2010   PTCA of L Cx 1996; BMS-RCA [facility]) 2009; Staged PCI RCA (70% ISR) --> 3.0 mm x 23 mm BMS, staged PCI Diag - 2.0 mm x 12 mm Mini Vision BMS (2.25 mm)  . Cataracts, bilateral    immature  . Chronic back pain    scoliosis--pt states unable to have surgery bc of age  . GERD (gastroesophageal reflux disease)    takes Nexium daily  . History of colon polyps   . Hyperlipidemia   . Hypertension   . Myocardial infarction [facility]) 1996, 2009   x 2;last time was about 8-29yr ago   . Obesity (BMI 30.0-34.9)   . OSA (obstructive sleep apnea)   . Osteoarthritis of both knees   . Peripheral edema   . Persistent atrial fibrillation ([place]   . [place]state cancer ([place] 2005   seed implant    Past Surgical History:  Procedure Laterality Date  . CARDIOVERSION N/A 06/12/2015   Procedure: CARDIOVERSION;  Surgeon: MSanda Klein MD;  [facility];  Service: Cardiovascular;  Laterality: N/A;  . CARDIOVERSION N/A 08/02/2015   Procedure: CARDIOVERSION;  Surgeon: JThompson Grayer MD;  [place] [facility] [facility]  Service: Cardiovascular;  Laterality: N/A;  . CHOLECYSTECTOMY    . COLONOSCOPY    . CORONARY ANGIOPLASTY WITH STENT PLACEMENT  1996, 2009, August 2011   PTCA of L Cx 1996; BMS-RCA ([facility] 2009; Staged PCI RCA (70% ISR) --> 3.0 mm x 23 mm BMS, staged PCI Diag - 2.0 mm x 12 mm Mini Vision BMS (2.25 mm)  . cyst removed  in college  . NEUROPLASTY / TRANSPOSITION MEDIAN NERVE AT CARPAL TUNNEL BILATERAL    . NM MYOVIEW LTD  November 2013   EF 53%; fixed mid inferior-inferolateral defect, infarct with no ischemia.  .Marland KitchenNM MYOVIEW LTD  11/24/2010   ejection fraction 58%,left ventricle is normal size ,no evidence of inducible myocardial ischemia  . right knee arthrsoscopy  [place]  . right trigger finger    . seeds placed into prostate   2005  . TEE WITHOUT CARDIOVERSION N/A 06/12/2015   Procedure: TRANSESOPHAGEAL ECHOCARDIOGRAM (TEE);  Surgeon: MiSanda KleinMD;  [facility];  Service: Cardiovascular: EF 55-60%.No LA or LAA thrombus. Normal Peal PAP ~33 mmHg.  . Marland KitchenOTAL KNEE ARTHROPLASTY  05/09/2012   Procedure: TOTAL KNEE ARTHROPLASTY;  Surgeon: FrKerin SalenMD;  Location: [place];  Service: Orthopedics;  Laterality: Right;  . TRANSTHORACIC ECHOCARDIOGRAM  06/2015   EF 60-65%. Mild LA dilation. Normal PA pressures: 32 mmHg. Aortic sclerosis but no stenosis.  [name] wisdom teeth extraccted      There were no vitals filed for this visit.  Subjective Assessment - 07/14/17 1116    Subjective  Pt reports that he did some of his HEP, but he did not do his sit to stands. 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     Pertinent History  persistent A-fib, history of MI, HLD, exertional dyspnea and fatigue, HTN    Patient Stated Goals  improve strength and balance     Currently in Pain?  No/denies               [facility] Adult PT Treatment/Exercise - 07/14/17 0001      Knee/Hip Exercises: Aerobic   Nustep  intervals from L1 to L3 x10 min,  therapist present to adjust resistance and encourage SPM above 65      Knee/Hip Exercises: Seated   Long Arc Quad  Both;2 sets;Strengthening;10 reps    Long Arc Quad Weight  5 lbs.    Long Arc Quad Limitations  adductor ball squeeze during exercise     Other Seated Knee/Hip Exercises  seated low rows with blue TB 2x15 reps     Sit to Sand  10 reps;2 sets;without UE support;Other (comment) sitting on foam pad, 2nd set x5 reps           Balance Exercises - 07/14/17 1137      Balance Exercises: Standing   Standing Eyes Opened  Narrow base of support (BOS);Head turns;Limitations;Other (comment) x10 reps Lt/Rt and up/down    Standing Eyes Closed  Narrow base of support (BOS);Head turns;Limitations x10 reps Lt/Rt    Tandem Stance  Eyes closed;2 reps;30 secs        PT Education - 07/14/17 1148    Education provided  Yes    Education Details  benefits of completing HEP more consistently    Person(s) Educated  Patient    Methods  Explanation    Comprehension  Verbalized understanding       PT Short Term Goals - 07/10/17 1123      PT SHORT TERM GOAL #1   Title  Pt will demo consistency and independence with his HEP to improve safety with functional tasks.     Time  4    Period  Weeks    Status  Achieved      PT SHORT TERM GOAL #2   Title  The patient will have improved trunk and LE strength evident by his ability stand 3 min during a session without need for a rest break.     Baseline  during balance activity up to 2 min    Time  4    Period  Weeks    Status  On-going      PT SHORT TERM GOAL #3   Title  Pt will demo improved walking tolerance up to atleast 5 min without the need for rest breaks, to increase his independence with going to the grocery store or attending church.     Time  4    Period  Weeks    Status  On-going      PT SHORT TERM GOAL #4   Title  Pt will demo improved hip flexor muscle length to atleast 0 deg, to  improve his mechanics with ambulation and  decrease energy expenditure.     Time  4    Period  Weeks    Status  On-going      PT SHORT TERM GOAL #5   Title  Pt will demo improved functional strength evident by his ability to complete sit to stand atleast 5 consecutive reps without the need for UE support.     Baseline  UE support on thighs without need for seat elevation    Time  4    Period  Weeks    Status  Partially Met        PT Long Term Goals - 05/23/17 1315      PT LONG TERM GOAL #1   Title  The patient will be independent with safe self progression of HEP to improved walking distance and further conditioning at discharge.    Time  8    Period  Weeks    Status  New    Target Date  07/18/17      PT LONG TERM GOAL #2   Title  Pt will demo improved LE strenth and steadiness with stairs, evident by his ability to ascend/descend clinic steps with reciprocal pattern and without assistance, 2/3 trials.     Time  8    Period  Weeks    Status  New      PT LONG TERM GOAL #3   Title  Pt will complete 5x sit to stand in less than 14 sec with minimal UE support, to reflect an improvement in his functional strength and power.    Time  8    Period  Weeks    Status  New      PT LONG TERM GOAL #4   Title  Pt will demo improved berg balance score of atleast 8 points, to reflect improvements in balance and decrease risk of falling.     Time  8    Period  Weeks    Status  New      PT LONG TERM GOAL #5   Title  Pt will demo improved mobility and balance, evident by her ability to complete the TUG in atleast 13 sec with LRAD.     Time  8    Period  Weeks    Status  New            Plan - 07/14/17 1148    Clinical Impression Statement  Continued this session with therex and activity to improve pt's endurance, LE strength and balance. Pt was able to complete partial tandem stance with eyes closed up to 30 sec at a time without LOB this session. He was also able to complete 10 reps of sit to stand from an elevated surface  which is an improvement from the 5 reps he is usually able to complete. Therapist encouraged more consistent HEP adherence and pt verbalized understanding of this. Will continue with current POC.     Rehab Potential  Good    Clinical Impairments Affecting Rehab Potential  (+) pt seems highly organized, (-) seldomly active at home    PT Frequency  2x / week    PT Duration  8 weeks    PT Treatment/Interventions  ADLs/Self Care Home Management;Moist Heat;Cryotherapy;Balance training;Therapeutic exercise;Therapeutic activities;Functional mobility training;Stair training;Gait training;Neuromuscular re-education;Patient/family education;Manual techniques;Passive range of motion;Dry needling    PT Next Visit Plan  re-evaluation    Consulted and Agree with Plan of Care  Patient  Patient will benefit from skilled therapeutic intervention in order to improve the following deficits and impairments:  Abnormal gait, Decreased activity tolerance, Decreased balance, Decreased safety awareness, Difficulty walking, Impaired flexibility, Obesity, Hypomobility, Decreased strength, Decreased range of motion, Decreased endurance, Decreased mobility, Postural dysfunction, Improper body mechanics  Visit Diagnosis: Muscle weakness (generalized)  Other abnormalities of gait and mobility  Unsteadiness on feet     Problem List Patient Active Problem List   Diagnosis Date Noted  . Paroxysmal (Persistent) atrial fibrillation (HCC) -- CHA2DS2-VASc Score; On Eliquis 10/01/2015  . Persistent atrial fibrillation ([place]):  CHA2DS2-VASc Score 4   . Exertional dyspnea and fatigue 08/13/2013  . CAD S/P percutaneous coronary angioplasty   . Hyperlipidemia with target LDL less than 70   . Essential hypertension   . Obesity (BMI 30.0-34.9)   . Myocardial infarction; history of   . Osteoarthritis of right knee 05/11/2012    11:54 AM,07/14/17 Sherol Dade PT, DPT [facility] [address] [place], [place], [address] Phone: [phone]   Fax:  [phone]  Name: BRAXSON HOLLINGSWORTH MRN: [id number] Date of Birth: April 22, 1930

## 2017-07-17 ENCOUNTER — Ambulatory Visit: Payer: Medicare Other | Admitting: Physical Therapy

## 2017-07-17 ENCOUNTER — Encounter: Payer: Self-pay | Admitting: Physical Therapy

## 2017-07-17 DIAGNOSIS — R2681 Unsteadiness on feet: Secondary | ICD-10-CM

## 2017-07-17 DIAGNOSIS — M6281 Muscle weakness (generalized): Secondary | ICD-10-CM

## 2017-07-17 DIAGNOSIS — R2689 Other abnormalities of gait and mobility: Secondary | ICD-10-CM

## 2017-07-17 NOTE — Therapy (Addendum)
[facility] [address] [place], [place], [address] Phone: [phone]   Fax:  [phone]  Physical Therapy Treatment/Re-evaluation  Patient Details  Name: Jerry [name] MRN: [id number] Date of Birth: August 01, 1929 Referring Provider: [name], MD   Encounter Date: 07/17/2017  PT End of Session - 07/17/17 1154    Visit Number  11    Number of Visits     Date for PT Re-Evaluation  07/18/17    Authorization Type  Medicare A and B    Authorization Time Period  1218/19 to 07/18/17, new: 07/19/17 to 08/30/17   Authorization - Visit Number  11    Authorization - Number of Visits  15    PT Start Time  1102    PT Stop Time  1144    PT Time Calculation (min)  42 min    Activity Tolerance  No increased pain;Patient tolerated treatment well    Behavior During Therapy  [facility] for tasks assessed/performed       Past Medical History:  Diagnosis Date  . Anxiety   . CAD S/P percutaneous coronary angioplasty 1996, 2009, 01/2010   PTCA of L Cx 1996; BMS-RCA [facility]) 2009; Staged PCI RCA (70% ISR) --> 3.0 mm x 23 mm BMS, staged PCI Diag - 2.0 mm x 12 mm Mini Vision BMS (2.25 mm)  . Cataracts, bilateral    immature  . Chronic back pain    scoliosis--pt states unable to have surgery bc of age  . GERD (gastroesophageal reflux disease)    takes Nexium daily  . History of colon polyps   . Hyperlipidemia   . Hypertension   . Myocardial infarction [facility]) 1996, 2009   x 2;last time was about 8-70yr ago   . Obesity (BMI 30.0-34.9)   . OSA (obstructive sleep apnea)   . Osteoarthritis of both knees   . Peripheral edema   . [place]istent [place]   . [place] ([place]   seed implant    Past Surgical History:  Procedure Laterality Date  . CARDIOVERSION N/A 06/12/2015   Procedure: CARDIOVERSION;  Surgeon: MSanda Klein MD;  [place]  Service: Cardiovascular;  Laterality: N/A;  .  CARDIOVERSION N/A 08/02/2015   Procedure: CARDIOVERSION;  Surgeon: JThompson Grayer MD;  [place]  Service: Cardiovascular;  Laterality: N/A;  . CHOLECYSTECTOMY    . COLONOSCOPY    . CORONARY ANGIOPLASTY WITH STENT PLACEMENT  1996, 2009, August 2011   PTCA of L Cx 1996; BMS-RCA ([facility] 2009; Staged PCI RCA (70% ISR) --> 3.0 mm x 23 mm BMS, staged PCI Diag - 2.0 mm x 12 mm Mini Vision BMS (2.25 mm)  . cyst removed  in college  . NEUROPLASTY / TRANSPOSITION MEDIAN NERVE AT CARPAL TUNNEL BILATERAL    . NM MYOVIEW LTD  November 2013   EF 53%; fixed mid inferior-inferolateral defect, infarct with no ischemia.  .Marland KitchenNM MYOVIEW LTD  11/24/2010   ejection fraction 58%,left ventricle is normal size ,no evidence of inducible myocardial ischemia  . right knee arthrsoscopy  [place]  . right trigger finger    . seeds placed into prostate   2005  . TEE WITHOUT CARDIOVERSION N/A 06/12/2015   Procedure: TRANSESOPHAGEAL ECHOCARDIOGRAM (TEE);  Surgeon: MiSanda KleinMD;  [facility]  Service: Cardiovascular: EF 55-60%.No LA or LAA thrombus. Normal Peal PAP ~33 mmHg.  . Marland KitchenOTAL KNEE ARTHROPLASTY  05/09/2012   Procedure: TOTAL KNEE ARTHROPLASTY;  Surgeon: FrPilar Plate  Karmen Bongo, MD;  Location: [place];  Service: Orthopedics;  Laterality: Right;  . TRANSTHORACIC ECHOCARDIOGRAM  06/2015   EF 60-65%. Mild LA dilation. Normal PA pressures: 32 mmHg. Aortic sclerosis but no stenosis.  [name] wisdom teeth extraccted      There were no vitals filed for this visit.  Subjective Assessment - 07/17/17 1104    Subjective  Pt reports that things are going well. He feels that his strength and everything is improving. He still gets winded and has to watch his balance with certain activity.     Pertinent History  persistent A-fib, history of MI, HLD, exertional dyspnea and fatigue, HTN    Patient Stated Goals  improve strength and balance     Currently in Pain?  No/denies         [facility] PT Assessment - 07/17/17 0001       Assessment   Medical Diagnosis  Balance Disorder     Referring Provider  [name], MD    Next MD Visit  unsure     Prior Therapy  last year OPPT      Precautions   Precautions  None      Restrictions   Weight Bearing Restrictions  No      Balance Screen   Has the patient fallen in the past 6 months  No    Has the patient had a decrease in activity level because of a fear of falling?   Yes    Is the patient reluctant to leave their home because of a fear of falling?   No      Home Film/video editor residence    Living Arrangements  Spouse/significant other    Additional Comments  1 flight of stairs to get to his "man cave" and bathroom       Prior Function   Level of Independence  Independent      Cognition   Overall Cognitive Status  Within Functional Limits for tasks assessed      Sensation   Additional Comments  pt denies any numbness/tingling in LEs       Posture/Postural Control   Posture Comments  significant forward head/rounded shoulders; excessive thoracic kyphosis       Strength   Right Hip Flexion  5/5    Right Hip Extension  3/5 limited by ROM    Right Hip ABduction  5/5 seated     Left Hip Flexion  5/5    Left Hip Extension  3/5 limited by ROM    Left Hip ABduction  5/5 seated     Right Knee Flexion  5/5 seated     Right Knee Extension  5/5    Left Knee Flexion  5/5  seated     Left Knee Extension  5/5    Right Ankle Dorsiflexion  5/5    Left Ankle Dorsiflexion  5/5      Flexibility   Soft Tissue Assessment /Muscle Length  -- hip extension limited 50%, unable to attain neutral       Transfers   Five time sit to stand comments   18 sec, no UE support  9.8 sec with UE support       Standardized Balance Assessment   Standardized Balance Assessment  Berg Balance Test;Timed Up and Go Test      Berg Balance Test   Sit to Stand  Able to stand  independently using hands    Standing Unsupported  Able to stand safely 2 minutes     Sitting with Back Unsupported but Feet Supported on Floor or Stool  Able to sit safely and securely 2 minutes    Stand to Sit  Controls descent by using hands    Transfers  Able to transfer safely, definite need of hands    Standing Unsupported with Eyes Closed  Able to stand 10 seconds with supervision no postural sway noted, PT supervision for safety    Standing Ubsupported with Feet Together  Able to place feet together independently and stand for 1 minute with supervision NBOS within pt's limits of pain     From Standing, Reach Forward with Outstretched Arm  Can reach forward >5 cm safely (2")    From Standing Position, Pick up Object from Floor  Able to pick up shoe, needs supervision    From Standing Position, Turn to Look Behind Over each Shoulder  Turn sideways only but maintains balance    Turn 360 Degrees  Able to turn 360 degrees safely but slowly 5-6 sec     Standing Unsupported, Alternately Place Feet on Step/Stool  Needs assistance to keep from falling or unable to try    Standing Unsupported, One Foot in Front  Able to take small step independently and hold 30 seconds limited ROM to place tandem    Standing on One Leg  Tries to lift leg/unable to hold 3 seconds but remains standing independently    Total Score  35    Berg comment:  Pt requiring rest breaks frequently during session       Timed Up and Go Test   TUG Comments  16 sec with SPC and no UE support with sit to stand                   [facility] Adult PT Treatment/Exercise - 07/17/17 0001      Ambulation/Gait   Pre-Gait Activities  Ascend and descend 4, 6" steps with 1 handrail and SPC x2 trials and step to pattern. Pt able to ascend and descend x1 trial with reciprocal pattern and therapist supervision.             PT Education - 07/17/17 1143    Education provided  Yes    Education Details  goals and progress; importance of establishing better habits and consistency for future transition out of  therapy; silver sneakers programs    Person(s) Educated  Patient    Methods  Explanation    Comprehension  Verbalized understanding       PT Short Term Goals - 07/17/17 1128      PT SHORT TERM GOAL #1   Title  Pt will demo consistency and independence with his HEP to improve safety with functional tasks.     Time  4    Period  Weeks    Status  Partially Met      PT SHORT TERM GOAL #2   Title  The patient will have improved trunk and LE strength evident by his ability stand 3 min during a session without need for a rest break.     Baseline  up to 4 min    Time  4    Period  Weeks    Status  Achieved      PT SHORT TERM GOAL #3   Title  Pt will demo improved walking tolerance up to atleast 5 min without the need for rest breaks, to increase his independence with going  to the grocery store or attending church.     Baseline  up to 1-2 min currently    Time  4    Period  Weeks    Status  On-going      PT SHORT TERM GOAL #4   Title  Pt will demo improved hip flexor muscle length to atleast 0 deg, to improve his mechanics with ambulation and decrease energy expenditure.     Baseline  lacking atleast 5 deg    Time  4    Period  Weeks    Status  On-going      PT SHORT TERM GOAL #5   Title  Pt will demo improved functional strength evident by his ability to complete sit to stand atleast 5 consecutive reps without the need for UE support.     Baseline  minimal UE support     Time  4    Period  Weeks    Status  Achieved        PT Long Term Goals - 07/17/17 1125      PT LONG TERM GOAL #1   Title  The patient will be independent with safe self progression of HEP to improved walking distance and further conditioning at discharge.    Time  8    Period  Weeks    Status  On-going      PT LONG TERM GOAL #2   Title  Pt will demo improved LE strength and steadiness with stairs, evident by his ability to ascend/descend clinic steps with reciprocal pattern and without assistance, 2/3  trials.     Baseline  1/3 trials    Time  8    Period  Weeks    Status  Partially Met      PT LONG TERM GOAL #3   Title  Pt will complete 5x sit to stand in less than 14 sec with minimal UE support, to reflect an improvement in his functional strength and power.    Baseline  less than 10 sec with UE support, 16.8 sec without UE    Time  8    Period  Weeks    Status  Partially Met      PT LONG TERM GOAL #4   Title  Pt will demo improved berg balance score of atleast 8 points, to reflect improvements in balance and decrease risk of falling.     Baseline  improved from 27 to 35     Time  8    Period  Weeks    Status  Achieved      PT LONG TERM GOAL #5   Title  Pt will demo improved mobility and balance, evident by her ability to complete the TUG in atleast 13 sec with LRAD.     Baseline  met with UE support, unable to complete without UE support    Time  8    Period  Weeks    Status  Partially Met            Plan - 07/17/17 1154    Clinical Impression Statement  Pt was re-evaluated this session having met nearly all of his short term goals, and meeting nearly 50% of his long term goals. He is able to ambulate with his [facility] without any reports of LOB or falls since beginning PT. He feels he is able to do more of his cooking and activity at home with less frequent rest breaks. Pts Berg balance score improved 8  points up to 35 out of 56 which is a significant improvement, however this still places him in the high falls risk category. His functional strength has improved evident by his ability to complete sit to stand atleast 5 reps without UE support which he was unable to complete at evaluation. His TUG and 5x sit to stand times have improved with the use of UE support, although he has not quite met recommended requirements for someone his age without UE use. In addition, he was able to ascend and descend clinic stairs (x4 steps) with 1 handrail and his [facility], with therapist supervision.  Pt continues to be at an increased risk of falling and demonstrates limitations in hip strength/flexibility as well as decreased endurance and would benefit from continued skilled PT to further address his remaining limitations and improve his mobility and independence at home.     Rehab Potential  Good    Clinical Impairments Affecting Rehab Potential  (+) pt seems highly organized, (-) seldomly active at home    PT Frequency  2x / week    PT Duration  6 weeks    PT Treatment/Interventions  ADLs/Self Care Home Management;Moist Heat;Cryotherapy;Balance training;Therapeutic exercise;Therapeutic activities;Functional mobility training;Stair training;Gait training;Neuromuscular re-education;Patient/family education;Manual techniques;Passive range of motion;Dry needling    PT Next Visit Plan  follow up on walking; incorporate walking intervals into session; progression of balance activity and posture strength    Consulted and Agree with Plan of Care  Patient       Patient will benefit from skilled therapeutic intervention in order to improve the following deficits and impairments:  Abnormal gait, Decreased activity tolerance, Decreased balance, Decreased safety awareness, Difficulty walking, Impaired flexibility, Obesity, Hypomobility, Decreased strength, Decreased range of motion, Decreased endurance, Decreased mobility, Postural dysfunction, Improper body mechanics  Visit Diagnosis: Muscle weakness (generalized)  Other abnormalities of gait and mobility  Unsteadiness on feet     Problem List Patient Active Problem List   Diagnosis Date Noted  . Paroxysmal (Persistent) atrial fibrillation (HCC) -- CHA2DS2-VASc Score; On Eliquis 10/01/2015  . Persistent atrial fibrillation ([place]):  CHA2DS2-VASc Score 4   . Exertional dyspnea and fatigue 08/13/2013  . CAD S/P percutaneous coronary angioplasty   . Hyperlipidemia with target LDL less than 70   . Essential hypertension   . Obesity (BMI  30.0-34.9)   . Myocardial infarction; history of   . Osteoarthritis of right knee 05/11/2012    1:33 PM,07/17/17 Sherol Dade PT, DPT [place] [facility] [address], [place], [place], [address] Phone: [phone]   Fax:  [phone]  Name: Jerry [name] MRN: [id number] Date of Birth: 1930/03/01   *addendum to update PT POC and send MD certification  5:73 [place],02/54/27 Sherol Dade PT, [place] [facility] [place]

## 2017-07-21 ENCOUNTER — Ambulatory Visit: Payer: Medicare Other | Admitting: Physical Therapy

## 2017-07-21 ENCOUNTER — Encounter: Payer: Self-pay | Admitting: Physical Therapy

## 2017-07-21 DIAGNOSIS — M6281 Muscle weakness (generalized): Secondary | ICD-10-CM

## 2017-07-21 DIAGNOSIS — R2681 Unsteadiness on feet: Secondary | ICD-10-CM

## 2017-07-21 DIAGNOSIS — R2689 Other abnormalities of gait and mobility: Secondary | ICD-10-CM

## 2017-07-21 NOTE — Addendum Note (Signed)
Addended bySherol Dade on: 07/21/2017 08:02 AM   Modules accepted: Orders

## 2017-07-21 NOTE — Therapy (Signed)
[facility] [address], [place], [place], [address] Phone: [phone]   Fax:  [phone]  Physical Therapy Treatment  Patient Details  Name: Jerry [name] MRN: [id number] Date of Birth: 1929/11/06 Referring Provider: [name], MD   Encounter Date: 07/21/2017  PT End of Session - 07/21/17 1149    Visit Number  12    Date for PT Re-Evaluation  08/30/17    Authorization Type  Medicare A and B    Authorization Time Period  1218/19 to 07/18/17, new: 07/19/17 to 08/30/17    Authorization - Visit Number  11    Authorization - Number of Visits  15    PT Start Time  1107    PT Stop Time  1145    PT Time Calculation (min)  38 min    Activity Tolerance  No increased pain;Patient tolerated treatment well    Behavior During Therapy  [facility] for tasks assessed/performed       Past Medical History:  Diagnosis Date  . Anxiety   . CAD S/P percutaneous coronary angioplasty 1996, 2009, 01/2010   PTCA of L Cx 1996; BMS-RCA [facility]) 2009; Staged PCI RCA (70% ISR) --> 3.0 mm x 23 mm BMS, staged PCI Diag - 2.0 mm x 12 mm Mini Vision BMS (2.25 mm)  . Cataracts, bilateral    immature  . Chronic back pain    scoliosis--pt states unable to have surgery bc of age  . GERD (gastroesophageal reflux disease)    takes Nexium daily  . History of colon polyps   . Hyperlipidemia   . Hypertension   . Myocardial infarction [facility]) 1996, 2009   x 2;last time was about 8-70yr ago   . Obesity (BMI 30.0-34.9)   . OSA (obstructive sleep apnea)   . Osteoarthritis of both knees   . Peripheral edema   . Persistent atrial fibrillation ([place]   . [place]   seed implant    Past Surgical History:  Procedure Laterality Date  . CARDIOVERSION N/A 06/12/2015   Procedure: CARDIOVERSION;  Surgeon: MSanda Klein MD;  [facility];  Service: Cardiovascular;  Laterality: N/A;  . CARDIOVERSION N/A 08/02/2015   Procedure:  CARDIOVERSION;  Surgeon: JThompson Grayer MD;  [place]  Service: Cardiovascular;  Laterality: N/A;  . CHOLECYSTECTOMY    . COLONOSCOPY    . CORONARY ANGIOPLASTY WITH STENT PLACEMENT  1996, 2009, August 2011   PTCA of L Cx 1996; BMS-RCA ([facility] 2009; Staged PCI RCA (70% ISR) --> 3.0 mm x 23 mm BMS, staged PCI Diag - 2.0 mm x 12 mm Mini Vision BMS (2.25 mm)  . cyst removed  in college  . NEUROPLASTY / TRANSPOSITION MEDIAN NERVE AT CARPAL TUNNEL BILATERAL    . NM MYOVIEW LTD  November 2013   EF 53%; fixed mid inferior-inferolateral defect, infarct with no ischemia.  .Marland KitchenNM MYOVIEW LTD  11/24/2010   ejection fraction 58%,left ventricle is normal size ,no evidence of inducible myocardial ischemia  . right knee arthrsoscopy  139yrago  . right trigger finger    . seeds placed into prostate   2005  . TEE WITHOUT CARDIOVERSION N/A 06/12/2015   Procedure: TRANSESOPHAGEAL ECHOCARDIOGRAM (TEE);  Surgeon: MiSanda KleinMD;  [facility];  Service: Cardiovascular: EF 55-60%.No LA or LAA thrombus. Normal Peal PAP ~33 mmHg.  . Marland KitchenOTAL KNEE ARTHROPLASTY  05/09/2012   Procedure: TOTAL KNEE ARTHROPLASTY;  Surgeon: FrKerin SalenMD;  [facility];  Service: Orthopedics;  Laterality: Right;  . TRANSTHORACIC ECHOCARDIOGRAM  06/2015   EF 60-65%. Mild LA dilation. Normal PA pressures: 32 mmHg. Aortic sclerosis but no stenosis.  [name] wisdom teeth extraccted      There were no vitals filed for this visit.  Subjective Assessment - 07/21/17 1109    Subjective  Pt reports that things are going ok. His wife has been having issues with mobility which has impacted the flow of their week. He has no other complaints at this time.     Pertinent History  persistent A-fib, history of MI, HLD, exertional dyspnea and fatigue, HTN    Patient Stated Goals  improve strength and balance     Currently in Pain?  No/denies                      [facility] Adult PT Treatment/Exercise - 07/21/17 0001       Therapeutic Activites    Therapeutic Activities  Other Therapeutic Activities    Other Therapeutic Activities  Circuit: (heel raises x20 reps; toe raises x20 reps; alt seated marching x20 reps) followed by walking 29f (~1 min) x3 rounds. SpO2 dropped to 86% end of last round but quickly returned to 91% after several seconds of focused breathing and rest break.      Knee/Hip Exercises: Seated   Other Seated Knee/Hip Exercises  --    Sit to Sand  2 sets;10 reps 1st set without cushion, 2nd set sitting on foam pad           Balance Exercises - 07/21/17 1150      Balance Exercises: Standing   Marching Limitations  Standing alt LE march x20 reps without UE support     Other Standing Exercises  weight shift Lt/Rt and anterior/posterior x10 reps on foam pad         PT Education - 07/21/17 1152    Education provided  Yes    Education Details  improvements in sit to stand    Person(s) Educated  Patient    Methods  Explanation    Comprehension  Verbalized understanding       PT Short Term Goals - 07/17/17 1128      PT SHORT TERM GOAL #1   Title  Pt will demo consistency and independence with his HEP to improve safety with functional tasks.     Time  4    Period  Weeks    Status  Partially Met      PT SHORT TERM GOAL #2   Title  The patient will have improved trunk and LE strength evident by his ability stand 3 min during a session without need for a rest break.     Baseline  up to 4 min    Time  4    Period  Weeks    Status  Achieved      PT SHORT TERM GOAL #3   Title  Pt will demo improved walking tolerance up to atleast 5 min without the need for rest breaks, to increase his independence with going to the grocery store or attending church.     Baseline  up to 1-2 min currently    Time  4    Period  Weeks    Status  On-going      PT SHORT TERM GOAL #4   Title  Pt will demo improved hip flexor muscle length to atleast 0 deg, to improve his mechanics with ambulation  and  decrease energy expenditure.     Baseline  lacking atleast 5 deg    Time  4    Period  Weeks    Status  On-going      PT SHORT TERM GOAL #5   Title  Pt will demo improved functional strength evident by his ability to complete sit to stand atleast 5 consecutive reps without the need for UE support.     Baseline  minimal UE support     Time  4    Period  Weeks    Status  Achieved        PT Long Term Goals - 07/17/17 1125      PT LONG TERM GOAL #1   Title  The patient will be independent with safe self progression of HEP to improved walking distance and further conditioning at discharge.    Time  8    Period  Weeks    Status  On-going      PT LONG TERM GOAL #2   Title  Pt will demo improved LE strength and steadiness with stairs, evident by his ability to ascend/descend clinic steps with reciprocal pattern and without assistance, 2/3 trials.     Baseline  1/3 trials    Time  8    Period  Weeks    Status  Partially Met      PT LONG TERM GOAL #3   Title  Pt will complete 5x sit to stand in less than 14 sec with minimal UE support, to reflect an improvement in his functional strength and power.    Baseline  less than 10 sec with UE support, 16.8 sec without UE    Time  8    Period  Weeks    Status  Partially Met      PT LONG TERM GOAL #4   Title  Pt will demo improved berg balance score of atleast 8 points, to reflect improvements in balance and decrease risk of falling.     Baseline  improved from 27 to 35     Time  8    Period  Weeks    Status  Achieved      PT LONG TERM GOAL #5   Title  Pt will demo improved mobility and balance, evident by her ability to complete the TUG in atleast 13 sec with LRAD.     Baseline  met with UE support, unable to complete without UE support    Time  8    Period  Weeks    Status  Partially Met            Plan - 07/21/17 1149    Clinical Impression Statement  Today's session focused on improving pt's endurance with the addition of  small walking/exercise circuits. Pt did demonstrate fatigue and increased respirations with this and was only able to complete 3 sets. During sit to stands, pt was able to complete his first set of 10 reps without the need for a foam pad underneath the hips, but did require this assistance for the last set. Pt continues to become easily fatigued with activity, requiring frequent rest breaks. Will continue with current POC to address deficits in strength, endurance and proprioception.     Rehab Potential  Good    Clinical Impairments Affecting Rehab Potential  (+) pt seems highly organized, (-) seldomly active at home    PT Frequency  2x / week    PT Duration  6 weeks  PT Treatment/Interventions  ADLs/Self Care Home Management;Moist Heat;Cryotherapy;Balance training;Therapeutic exercise;Therapeutic activities;Functional mobility training;Stair training;Gait training;Neuromuscular re-education;Patient/family education;Manual techniques;Passive range of motion;Dry needling    PT Next Visit Plan  follow up on sit to stand; continue to incorporate walking intervals into session; progression of balance activity and posture/strength    Consulted and Agree with Plan of Care  Patient       Patient will benefit from skilled therapeutic intervention in order to improve the following deficits and impairments:  Abnormal gait, Decreased activity tolerance, Decreased balance, Decreased safety awareness, Difficulty walking, Impaired flexibility, Obesity, Hypomobility, Decreased strength, Decreased range of motion, Decreased endurance, Decreased mobility, Postural dysfunction, Improper body mechanics  Visit Diagnosis: Muscle weakness (generalized)  Other abnormalities of gait and mobility  Unsteadiness on feet     Problem List Patient Active Problem List   Diagnosis Date Noted  . Paroxysmal (Persistent) atrial fibrillation (HCC) -- CHA2DS2-VASc Score; On Eliquis 10/01/2015  . Persistent atrial  fibrillation ([place]):  CHA2DS2-VASc Score 4   . Exertional dyspnea and fatigue 08/13/2013  . CAD S/P percutaneous coronary angioplasty   . Hyperlipidemia with target LDL less than 70   . Essential hypertension   . Obesity (BMI 30.0-34.9)   . Myocardial infarction; history of   . Osteoarthritis of right knee 05/11/2012    11:53 AM,07/21/17 Sherol Dade PT, DPT [facility] [address] [place], [place], [address] Phone: [phone]   Fax:  [phone]  Name: ESMOND HINCH MRN: [id number] Date of Birth: [birth year]

## 2017-07-24 ENCOUNTER — Ambulatory Visit: Payer: Medicare Other | Admitting: Physical Therapy

## 2017-07-24 ENCOUNTER — Encounter: Payer: Self-pay | Admitting: Physical Therapy

## 2017-07-24 DIAGNOSIS — R2689 Other abnormalities of gait and mobility: Secondary | ICD-10-CM | POA: Diagnosis not present

## 2017-07-24 DIAGNOSIS — M6281 Muscle weakness (generalized): Secondary | ICD-10-CM

## 2017-07-24 DIAGNOSIS — R2681 Unsteadiness on feet: Secondary | ICD-10-CM

## 2017-07-24 NOTE — Therapy (Signed)
[facility] [address] [place], [place], [address] Phone: [phone]   Fax:  [phone]  Physical Therapy Treatment  Patient Details  Name: Jerry [name] MRN: [id number] Date of Birth: [birth year] Referring Provider: [name], MD   Encounter Date: 07/24/2017  PT End of Session - 07/24/17 1128    Visit Number  13    Date for PT Re-Evaluation  08/30/17    Authorization Type  Medicare A and B    Authorization Time Period  1218/19 to 07/18/17, new: 07/19/17 to 08/30/17    Authorization - Visit Number  13    Authorization - Number of Visits  15    PT Start Time  1101    PT Stop Time  1142    PT Time Calculation (min)  41 min    Activity Tolerance  No increased pain;Patient tolerated treatment well    Behavior During Therapy  [facility] for tasks assessed/performed       Past Medical History:  Diagnosis Date  . Anxiety   . CAD S/P percutaneous coronary angioplasty 1996, 2009, 01/2010   PTCA of L Cx 1996; BMS-RCA [facility]) 2009; Staged PCI RCA (70% ISR) --> 3.0 mm x 23 mm BMS, staged PCI Diag - 2.0 mm x 12 mm Mini Vision BMS (2.25 mm)  . Cataracts, bilateral    immature  . Chronic back pain    scoliosis--pt states unable to have surgery bc of age  . GERD (gastroesophageal reflux disease)    takes Nexium daily  . History of colon polyps   . Hyperlipidemia   . Hypertension   . Myocardial infarction [facility]) 1996, 2009   x 2;last time was about 8-25yr ago   . Obesity (BMI 30.0-34.9)   . OSA (obstructive sleep apnea)   . Osteoarthritis of both knees   . Peripheral edema   . [place]istent [place] ([place]   . [place]   seed implant    Past Surgical History:  Procedure Laterality Date  . CARDIOVERSION N/A 06/12/2015   Procedure: CARDIOVERSION;  Surgeon: MSanda Klein MD;  [facility];  Service: Cardiovascular;  Laterality: N/A;  . CARDIOVERSION N/A 08/02/2015   Procedure:  CARDIOVERSION;  Surgeon: JThompson Grayer MD;  [place]: [place]  Service: Cardiovascular;  Laterality: N/A;  . CHOLECYSTECTOMY    . COLONOSCOPY    . CORONARY ANGIOPLASTY WITH STENT PLACEMENT  1996, 2009, August 2011   PTCA of L Cx 1996; BMS-RCA ([facility] 2009; Staged PCI RCA (70% ISR) --> 3.0 mm x 23 mm BMS, staged PCI Diag - 2.0 mm x 12 mm Mini Vision BMS (2.25 mm)  . cyst removed  in college  . NEUROPLASTY / TRANSPOSITION MEDIAN NERVE AT CARPAL TUNNEL BILATERAL    . NM MYOVIEW LTD  November 2013   EF 53%; fixed mid inferior-inferolateral defect, infarct with no ischemia.  .Marland KitchenNM MYOVIEW LTD  11/24/2010   ejection fraction 58%,left ventricle is normal size ,no evidence of inducible myocardial ischemia  . right knee arthrsoscopy  164yrago  . right trigger finger    . seeds placed into prostate   2005  . TEE WITHOUT CARDIOVERSION N/A 06/12/2015   Procedure: TRANSESOPHAGEAL ECHOCARDIOGRAM (TEE);  Surgeon: MiSanda KleinMD;  [facility];  Service: Cardiovascular: EF 55-60%.No LA or LAA thrombus. Normal Peal PAP ~33 mmHg.  . Marland KitchenOTAL KNEE ARTHROPLASTY  05/09/2012   Procedure: TOTAL KNEE ARTHROPLASTY;  Surgeon: FrKerin SalenMD;  [facility];  Service: Orthopedics;  Laterality: Right;  . TRANSTHORACIC ECHOCARDIOGRAM  06/2015   EF 60-65%. Mild LA dilation. Normal PA pressures: 32 mmHg. Aortic sclerosis but no stenosis.  [name] wisdom teeth extraccted      There were no vitals filed for this visit.  Subjective Assessment - 07/24/17 1102    Subjective  Pt reports that things are going well. He did not try to work on his sit to stand because he takes the weekend off. He will start this today.     Pertinent History  persistent A-fib, history of MI, HLD, exertional dyspnea and fatigue, HTN    Patient Stated Goals  improve strength and balance     Currently in Pain?  No/denies          [facility] Adult PT Treatment/Exercise - 07/24/17 0001      Exercises   Exercises  Other Exercises    Other  Exercises   circuit: sit to stand (x5) with standing alt LE taps on 6" box (BUE support) x4 rounds (SpO2 monitored throughout) SpO2 dropped to 83% and required 1-2 minute rest breaks      Knee/Hip Exercises: Standing   Other Standing Knee Exercises  Rt and Lt hip flexor stretch in standing 2x30 sec each       Knee/Hip Exercises: Seated   Other Seated Knee/Hip Exercises  heel raises with 7# dumbbells x20 reps           Balance Exercises - 07/24/17 1136      Balance Exercises: Standing   Other Standing Exercises  weight shifting Lt/Rt on foam 2x30 sec; weight shifting in tandem Lt/Rt forward on foam 2x30 sec each  CGA         PT Education - 07/24/17 1142    Education provided  Yes    Education Details  noted drops in oxygen levels during session and benefits of reaching out to PCP regarding this    Person(s) Educated  Patient    Methods  Explanation    Comprehension  Verbalized understanding       PT Short Term Goals - 07/17/17 1128      PT SHORT TERM GOAL #1   Title  Pt will demo consistency and independence with his HEP to improve safety with functional tasks.     Time  4    Period  Weeks    Status  Partially Met      PT SHORT TERM GOAL #2   Title  The patient will have improved trunk and LE strength evident by his ability stand 3 min during a session without need for a rest break.     Baseline  up to 4 min    Time  4    Period  Weeks    Status  Achieved      PT SHORT TERM GOAL #3   Title  Pt will demo improved walking tolerance up to atleast 5 min without the need for rest breaks, to increase his independence with going to the grocery store or attending church.     Baseline  up to 1-2 min currently    Time  4    Period  Weeks    Status  On-going      PT SHORT TERM GOAL #4   Title  Pt will demo improved hip flexor muscle length to atleast 0 deg, to improve his mechanics with ambulation and decrease energy expenditure.     Baseline  lacking atleast 5 deg  Time   4    Period  Weeks    Status  On-going      PT SHORT TERM GOAL #5   Title  Pt will demo improved functional strength evident by his ability to complete sit to stand atleast 5 consecutive reps without the need for UE support.     Baseline  minimal UE support     Time  4    Period  Weeks    Status  Achieved        PT Long Term Goals - 07/17/17 1125      PT LONG TERM GOAL #1   Title  The patient will be independent with safe self progression of HEP to improved walking distance and further conditioning at discharge.    Time  8    Period  Weeks    Status  On-going      PT LONG TERM GOAL #2   Title  Pt will demo improved LE strength and steadiness with stairs, evident by his ability to ascend/descend clinic steps with reciprocal pattern and without assistance, 2/3 trials.     Baseline  1/3 trials    Time  8    Period  Weeks    Status  Partially Met      PT LONG TERM GOAL #3   Title  Pt will complete 5x sit to stand in less than 14 sec with minimal UE support, to reflect an improvement in his functional strength and power.    Baseline  less than 10 sec with UE support, 16.8 sec without UE    Time  8    Period  Weeks    Status  Partially Met      PT LONG TERM GOAL #4   Title  Pt will demo improved berg balance score of atleast 8 points, to reflect improvements in balance and decrease risk of falling.     Baseline  improved from 27 to 35     Time  8    Period  Weeks    Status  Achieved      PT LONG TERM GOAL #5   Title  Pt will demo improved mobility and balance, evident by her ability to complete the TUG in atleast 13 sec with LRAD.     Baseline  met with UE support, unable to complete without UE support    Time  8    Period  Weeks    Status  Partially Met            Plan - 07/24/17 1132    Clinical Impression Statement  Continued this session with circuit activity ot improve pt's endurance and stamina with standing activity. Pt's SpO2 was monitored, noting shortness  of breath and fatigue with SpO2 between 82% and 86% after only 20-30 sec of activity.  Therapist discussed benefits of following up with PCP regarding his shortness of breath with activity and he confirmed that he will do this. Pt was able to complete weight shifting on unstable base up to 30 sec this session without LOB. Will continue with current POC.     Rehab Potential  Good    Clinical Impairments Affecting Rehab Potential  (+) pt seems highly organized, (-) seldomly active at home    PT Frequency  2x / week    PT Duration  6 weeks    PT Treatment/Interventions  ADLs/Self Care Home Management;Moist Heat;Cryotherapy;Balance training;Therapeutic exercise;Therapeutic activities;Functional mobility training;Stair training;Gait training;Neuromuscular re-education;Patient/family education;Manual techniques;Passive range of  motion;Dry needling    PT Next Visit Plan  follow up on sit to stand; f/u with O2 supplement/PCP; continue to incorporate walking intervals into session; progression of balance activity and posture/strength    Consulted and Agree with Plan of Care  Patient       Patient will benefit from skilled therapeutic intervention in order to improve the following deficits and impairments:  Abnormal gait, Decreased activity tolerance, Decreased balance, Decreased safety awareness, Difficulty walking, Impaired flexibility, Obesity, Hypomobility, Decreased strength, Decreased range of motion, Decreased endurance, Decreased mobility, Postural dysfunction, Improper body mechanics  Visit Diagnosis: Muscle weakness (generalized)  Other abnormalities of gait and mobility  Unsteadiness on feet     Problem List Patient Active Problem List   Diagnosis Date Noted  . Paroxysmal (Persistent) atrial fibrillation (HCC) -- CHA2DS2-VASc Score; On Eliquis 10/01/2015  . Persistent atrial fibrillation ([place]):  CHA2DS2-VASc Score 4   . Exertional dyspnea and fatigue 08/13/2013  . CAD S/P percutaneous  coronary angioplasty   . Hyperlipidemia with target LDL less than 70   . Essential hypertension   . Obesity (BMI 30.0-34.9)   . Myocardial infarction; history of   . Osteoarthritis of right knee 05/11/2012   11:44 AM,07/24/17 Sherol Dade PT, DPT [facility] [address], [place], [place], [address] Phone: [phone]   Fax:  [phone]  Name: Jerry [name] MRN: [id number] Date of Birth: Sep 15, 1929

## 2017-07-26 ENCOUNTER — Ambulatory Visit
Admission: RE | Admit: 2017-07-26 | Discharge: 2017-07-26 | Disposition: A | Discharge status: Home / Self Care | Payer: Medicare Other | Source: Ambulatory Visit | Attending: Family Medicine | Admitting: Family Medicine

## 2017-07-26 ENCOUNTER — Other Ambulatory Visit: Payer: Self-pay | Admitting: Family Medicine

## 2017-07-26 DIAGNOSIS — R0902 Hypoxemia: Secondary | ICD-10-CM | POA: Diagnosis not present

## 2017-07-26 DIAGNOSIS — E669 Obesity, unspecified: Secondary | ICD-10-CM | POA: Diagnosis not present

## 2017-07-28 ENCOUNTER — Encounter: Payer: Medicare Other | Admitting: Physical Therapy

## 2017-08-01 ENCOUNTER — Encounter: Payer: Self-pay | Admitting: Physical Therapy

## 2017-08-01 ENCOUNTER — Ambulatory Visit: Payer: Medicare Other | Admitting: Physical Therapy

## 2017-08-01 DIAGNOSIS — M6281 Muscle weakness (generalized): Secondary | ICD-10-CM | POA: Diagnosis not present

## 2017-08-01 DIAGNOSIS — R2689 Other abnormalities of gait and mobility: Secondary | ICD-10-CM | POA: Diagnosis not present

## 2017-08-01 DIAGNOSIS — R2681 Unsteadiness on feet: Secondary | ICD-10-CM | POA: Diagnosis not present

## 2017-08-01 NOTE — Therapy (Signed)
[facility] [address], [place], [place], [address] Phone: [phone]   Fax:  [phone]  Physical Therapy Treatment  Patient Details  Name: Jerry [name] MRN: [id number] Date of Birth: 01-22-1930 Referring Provider: [name], MD   Encounter Date: 08/01/2017  PT End of Session - 08/01/17 1526    Visit Number  14    Number of Visits  17    Date for PT Re-Evaluation  08/30/17    Authorization Type  Medicare A and B    Authorization Time Period  1218/19 to 07/18/17, new: 07/19/17 to 08/30/17    Authorization - Visit Number  14    Authorization - Number of Visits  15    PT Start Time  3976    PT Stop Time  7341    PT Time Calculation (min)  45 min    Activity Tolerance  No increased pain;Patient tolerated treatment well    Behavior During Therapy  [facility] for tasks assessed/performed       Past Medical History:  Diagnosis Date  . Anxiety   . CAD S/P percutaneous coronary angioplasty 1996, 2009, 01/2010   PTCA of L Cx 1996; BMS-RCA [facility]) 2009; Staged PCI RCA (70% ISR) --> 3.0 mm x 23 mm BMS, staged PCI Diag - 2.0 mm x 12 mm Mini Vision BMS (2.25 mm)  . Cataracts, bilateral    immature  . Chronic back pain    scoliosis--pt states unable to have surgery bc of age  . GERD (gastroesophageal reflux disease)    takes Nexium daily  . History of colon polyps   . Hyperlipidemia   . Hypertension   . Myocardial infarction [facility]) 1996, 2009   x 2;last time was about 8-55yr ago   . Obesity (BMI 30.0-34.9)   . OSA (obstructive sleep apnea)   . Osteoarthritis of both knees   . Peripheral edema   . [place]   . Prostate cancer ([place] 2005   seed implant    Past Surgical History:  Procedure Laterality Date  . CARDIOVERSION N/A 06/12/2015   Procedure: CARDIOVERSION;  Surgeon: MSanda Klein MD;  [facility];  Service: Cardiovascular;  Laterality: N/A;  . CARDIOVERSION N/A  08/02/2015   Procedure: CARDIOVERSION;  Surgeon: JThompson Grayer MD;  [place]  Service: Cardiovascular;  Laterality: N/A;  . CHOLECYSTECTOMY    . COLONOSCOPY    . CORONARY ANGIOPLASTY WITH STENT PLACEMENT  1996, 2009, August 2011   PTCA of L Cx 1996; BMS-RCA ([facility] 2009; Staged PCI RCA (70% ISR) --> 3.0 mm x 23 mm BMS, staged PCI Diag - 2.0 mm x 12 mm Mini Vision BMS (2.25 mm)  . cyst removed  in college  . NEUROPLASTY / TRANSPOSITION MEDIAN NERVE AT CARPAL TUNNEL BILATERAL    . NM MYOVIEW LTD  November 2013   EF 53%; fixed mid inferior-inferolateral defect, infarct with no ischemia.  .Marland KitchenNM MYOVIEW LTD  11/24/2010   ejection fraction 58%,left ventricle is normal size ,no evidence of inducible myocardial ischemia  . right knee arthrsoscopy  126yrago  . right trigger finger    . seeds placed into prostate   2005  . TEE WITHOUT CARDIOVERSION N/A 06/12/2015   Procedure: TRANSESOPHAGEAL ECHOCARDIOGRAM (TEE);  Surgeon: MiSanda KleinMD;  [facility];  Service: Cardiovascular: EF 55-60%.No LA or LAA thrombus. Normal Peal PAP ~33 mmHg.  . Marland KitchenOTAL KNEE ARTHROPLASTY  05/09/2012   Procedure: TOTAL KNEE ARTHROPLASTY;  Surgeon: Kerin Salen, MD;  Location: [place];  Service: Orthopedics;  Laterality: Right;  . TRANSTHORACIC ECHOCARDIOGRAM  06/2015   EF 60-65%. Mild LA dilation. Normal PA pressures: 32 mmHg. Aortic sclerosis but no stenosis.  [name] wisdom teeth extraccted      Vitals:   08/01/17 1458  SpO2: 98%    Subjective Assessment - 08/01/17 1447    Subjective  I started the process to get Oxygen. I had a chest x-ray and showed bronchittis.     Pertinent History  persistent A-fib, history of MI, HLD, exertional dyspnea and fatigue, HTN    Patient Stated Goals  improve strength and balance     Currently in Pain?  No/denies                      [facility] Adult PT Treatment/Exercise - 08/01/17 0001      Neuro Re-ed    Neuro Re-ed Details   stand on foam 30 seconds 2  times ; tandem stance 30 sec      Exercises   Exercises  Other Exercises    Other Exercises   circuit: sit to stand (x5) with standing alt LE taps on 6" box (BUE support) x4 rounds (SpO2 monitored throughout) SpO2 dropped to 84% and required 1-2 minute rest breaks      Knee/Hip Exercises: Standing   Other Standing Knee Exercises  Rt and Lt hip flexor stretch in standing 2x30 sec each       Knee/Hip Exercises: Seated   Other Seated Knee/Hip Exercises  heel raises with 7# ankle x20 reps                PT Short Term Goals - 07/17/17 1128      PT SHORT TERM GOAL #1   Title  Pt will demo consistency and independence with his HEP to improve safety with functional tasks.     Time  4    Period  Weeks    Status  Partially Met      PT SHORT TERM GOAL #2   Title  The patient will have improved trunk and LE strength evident by his ability stand 3 min during a session without need for a rest break.     Baseline  up to 4 min    Time  4    Period  Weeks    Status  Achieved      PT SHORT TERM GOAL #3   Title  Pt will demo improved walking tolerance up to atleast 5 min without the need for rest breaks, to increase his independence with going to the grocery store or attending church.     Baseline  up to 1-2 min currently    Time  4    Period  Weeks    Status  On-going      PT SHORT TERM GOAL #4   Title  Pt will demo improved hip flexor muscle length to atleast 0 deg, to improve his mechanics with ambulation and decrease energy expenditure.     Baseline  lacking atleast 5 deg    Time  4    Period  Weeks    Status  On-going      PT SHORT TERM GOAL #5   Title  Pt will demo improved functional strength evident by his ability to complete sit to stand atleast 5 consecutive reps without the need for UE support.     Baseline  minimal UE support  Time  4    Period  Weeks    Status  Achieved        PT Long Term Goals - 07/17/17 1125      PT LONG TERM GOAL #1   Title  The  patient will be independent with safe self progression of HEP to improved walking distance and further conditioning at discharge.    Time  8    Period  Weeks    Status  On-going      PT LONG TERM GOAL #2   Title  Pt will demo improved LE strength and steadiness with stairs, evident by his ability to ascend/descend clinic steps with reciprocal pattern and without assistance, 2/3 trials.     Baseline  1/3 trials    Time  8    Period  Weeks    Status  Partially Met      PT LONG TERM GOAL #3   Title  Pt will complete 5x sit to stand in less than 14 sec with minimal UE support, to reflect an improvement in his functional strength and power.    Baseline  less than 10 sec with UE support, 16.8 sec without UE    Time  8    Period  Weeks    Status  Partially Met      PT LONG TERM GOAL #4   Title  Pt will demo improved berg balance score of atleast 8 points, to reflect improvements in balance and decrease risk of falling.     Baseline  improved from 27 to 35     Time  8    Period  Weeks    Status  Achieved      PT LONG TERM GOAL #5   Title  Pt will demo improved mobility and balance, evident by her ability to complete the TUG in atleast 13 sec with LRAD.     Baseline  met with UE support, unable to complete without UE support    Time  8    Period  Weeks    Status  Partially Met            Plan - 08/01/17 1527    Clinical Impression Statement  Patient was able to bring his oxygen level up with breathing correctly.  Patient needs 1-2 min to bring his oxygen level back to 94 O2. Patient has to use his hands ot go up and down from a chair. Patient will benefit from skilled therapy to improve his balance and sendurance while monitoring his oxygen level.     Rehab Potential  Good    Clinical Impairments Affecting Rehab Potential  (+) pt seems highly organized, (-) seldomly active at home    PT Frequency  2x / week    PT Duration  6 weeks    PT Treatment/Interventions  ADLs/Self Care  Home Management;Moist Heat;Cryotherapy;Balance training;Therapeutic exercise;Therapeutic activities;Functional mobility training;Stair training;Gait training;Neuromuscular re-education;Patient/family education;Manual techniques;Passive range of motion;Dry needling    PT Next Visit Plan  follow up on sit to stand; f/u with O2 supplement/PCP; continue to incorporate walking intervals into session; progression of balance activity and posture/strength    PT Home Exercise Plan  needs next session: LE strength and flexibility    Consulted and Agree with Plan of Care  Patient       Patient will benefit from skilled therapeutic intervention in order to improve the following deficits and impairments:  Abnormal gait, Decreased activity tolerance, Decreased balance, Decreased safety awareness, Difficulty  walking, Impaired flexibility, Obesity, Hypomobility, Decreased strength, Decreased range of motion, Decreased endurance, Decreased mobility, Postural dysfunction, Improper body mechanics  Visit Diagnosis: Muscle weakness (generalized)  Other abnormalities of gait and mobility  Unsteadiness on feet     Problem List Patient Active Problem List   Diagnosis Date Noted  . Paroxysmal (Persistent) atrial fibrillation (HCC) -- CHA2DS2-VASc Score; On Eliquis 10/01/2015  . Persistent atrial fibrillation ([place]):  CHA2DS2-VASc Score 4   . Exertional dyspnea and fatigue 08/13/2013  . CAD S/P percutaneous coronary angioplasty   . Hyperlipidemia with target LDL less than 70   . Essential hypertension   . Obesity (BMI 30.0-34.9)   . Myocardial infarction; history of   . Osteoarthritis of right knee 05/11/2012    Earlie Counts, PT 08/01/17 3:30 PM   [facility] [address], [place], [place], [address] Phone: [phone]   Fax:  [phone]  Name: JAVARRI SEGAL MRN: [id number] Date of Birth: Oct 16, 1929

## 2017-08-04 ENCOUNTER — Encounter: Payer: Medicare Other | Admitting: Physical Therapy

## 2017-08-07 ENCOUNTER — Encounter: Payer: Medicare Other | Admitting: Physical Therapy

## 2017-08-10 DIAGNOSIS — R0902 Hypoxemia: Secondary | ICD-10-CM | POA: Diagnosis not present

## 2017-08-10 DIAGNOSIS — J449 Chronic obstructive pulmonary disease, unspecified: Secondary | ICD-10-CM | POA: Diagnosis not present

## 2017-08-14 ENCOUNTER — Encounter: Payer: Medicare Other | Admitting: Physical Therapy

## 2017-08-15 ENCOUNTER — Telehealth: Payer: Self-pay | Admitting: Cardiology

## 2017-08-15 NOTE — Telephone Encounter (Signed)
Jerry [name] is calling because he is needing something for his insurance company to show that he oxygen insuffiencey . Please call

## 2017-08-15 NOTE — Telephone Encounter (Signed)
Returned call to patient.He stated he needed a letter from [place] saying he needs O2.Stated PCP Dr.Mitchell ordered O2 and he already ordered a over night test,but he still needs letter from [place].Advised I will send message to Dr.Harding's RN.

## 2017-08-17 NOTE — Telephone Encounter (Signed)
SPOKE TO PATIENT.   INFORMED PATIENT WILL NEED TO GET MORE INFORMATION ON WHY THIS IS NEEDED. PATIENT STATE HE RECEIVED INFORMATION FROM  REPRESENTATIVE FROM [facility]--  ANGIE  [phone].  PATIENT IS AWARE RN WILL CONTACT  THE ABOVE AND SEE WHAT IS NEEDED AND CONTACT PATIENT BACK .

## 2017-08-18 ENCOUNTER — Ambulatory Visit: Payer: Medicare Other | Attending: Family Medicine | Admitting: Physical Therapy

## 2017-08-18 ENCOUNTER — Encounter: Payer: Self-pay | Admitting: Physical Therapy

## 2017-08-18 DIAGNOSIS — R2681 Unsteadiness on feet: Secondary | ICD-10-CM | POA: Diagnosis not present

## 2017-08-18 DIAGNOSIS — R2689 Other abnormalities of gait and mobility: Secondary | ICD-10-CM | POA: Insufficient documentation

## 2017-08-18 DIAGNOSIS — M6281 Muscle weakness (generalized): Secondary | ICD-10-CM | POA: Diagnosis not present

## 2017-08-18 NOTE — Therapy (Signed)
[facility] [address] [place], [place], [address] Phone: [phone]   Fax:  [phone]  Physical Therapy Treatment  Patient Details  Name: Jerry [name] MRN: [id number] Date of Birth: 10-06-1929 Referring Provider: [name], MD   Encounter Date: 08/18/2017  PT End of Session - 08/18/17 1116    Visit Number  15    Number of Visits  17    Date for PT Re-Evaluation  08/30/17    Authorization Type  Medicare A and B    Authorization Time Period  1218/19 to 07/18/17, new: 07/19/17 to 08/30/17    Authorization - Visit Number  15    Authorization - Number of Visits  30    PT Start Time  1101    PT Stop Time  3474    PT Time Calculation (min)  42 min    Activity Tolerance  No increased pain;Patient tolerated treatment well    Behavior During Therapy  [facility] for tasks assessed/performed       Past Medical History:  Diagnosis Date  . Anxiety   . CAD S/P percutaneous coronary angioplasty 1996, 2009, 01/2010   PTCA of L Cx 1996; BMS-RCA [facility]) 2009; Staged PCI RCA (70% ISR) --> 3.0 mm x 23 mm BMS, staged PCI Diag - 2.0 mm x 12 mm Mini Vision BMS (2.25 mm)  . Cataracts, bilateral    immature  . Chronic back pain    scoliosis--pt states unable to have surgery bc of age  . GERD (gastroesophageal reflux disease)    takes Nexium daily  . History of colon polyps   . Hyperlipidemia   . Hypertension   . Myocardial infarction [facility]) 1996, 2009   x 2;last time was about 8-69yr ago   . Obesity (BMI 30.0-34.9)   . OSA (obstructive sleep apnea)   . Osteoarthritis of both knees   . Peripheral edema   . [place]istent atrial fibrillation ([place]   . [place] cancer ([place]   seed implant    Past Surgical History:  Procedure Laterality Date  . CARDIOVERSION N/A 06/12/2015   Procedure: CARDIOVERSION;  Surgeon: MSanda Klein MD;  [facility];  Service: Cardiovascular;  Laterality: N/A;  . CARDIOVERSION N/A  08/02/2015   Procedure: CARDIOVERSION;  Surgeon: JThompson Grayer MD;  [facility]  Service: Cardiovascular;  Laterality: N/A;  . CHOLECYSTECTOMY    . COLONOSCOPY    . CORONARY ANGIOPLASTY WITH STENT PLACEMENT  1996, 2009, August 2011   PTCA of L Cx 1996; BMS-RCA ([facility] 2009; Staged PCI RCA (70% ISR) --> 3.0 mm x 23 mm BMS, staged PCI Diag - 2.0 mm x 12 mm Mini Vision BMS (2.25 mm)  . cyst removed  in college  . NEUROPLASTY / TRANSPOSITION MEDIAN NERVE AT CARPAL TUNNEL BILATERAL    . NM MYOVIEW LTD  November 2013   EF 53%; fixed mid inferior-inferolateral defect, infarct with no ischemia.  .Marland KitchenNM MYOVIEW LTD  11/24/2010   ejection fraction 58%,left ventricle is normal size ,no evidence of inducible myocardial ischemia  . right knee arthrsoscopy  133yrago  . right trigger finger    . seeds placed into prostate   2005  . TEE WITHOUT CARDIOVERSION N/A 06/12/2015   Procedure: TRANSESOPHAGEAL ECHOCARDIOGRAM (TEE);  Surgeon: MiSanda KleinMD;  [facility]  Service: Cardiovascular: EF 55-60%.No LA or LAA thrombus. Normal Peal PAP ~33 mmHg.  . Marland KitchenOTAL KNEE ARTHROPLASTY  05/09/2012   Procedure: TOTAL KNEE ARTHROPLASTY;  Surgeon: Kerin Salen, MD;  Location: [place];  Service: Orthopedics;  Laterality: Right;  . TRANSTHORACIC ECHOCARDIOGRAM  06/2015   EF 60-65%. Mild LA dilation. Normal PA pressures: 32 mmHg. Aortic sclerosis but no stenosis.  [name] wisdom teeth extraccted      There were no vitals filed for this visit.  Subjective Assessment - 08/18/17 1104    Subjective  Pt reports that he took an overnight test with his physician which identified that he does need oxygen, but he has been having issues with getting oxygen covered by his insurance. He is confused by the whole ordeal.     Pertinent History  persistent A-fib, history of MI, HLD, exertional dyspnea and fatigue, HTN    Patient Stated Goals  improve strength and balance     Currently in Pain?  No/denies                       [facility] Adult PT Treatment/Exercise - 08/18/17 0001      Knee/Hip Exercises: Seated   Clamshell with TheraBand  Blue 3x20 reps each (single leg)    Other Seated Knee/Hip Exercises  heel raises with #7.5 ankle weights 2x20 reps     Marching  Both;3 sets;10 reps;Limitations    Marching Limitations  Lt with #7.5    Hamstring Curl  3 sets;Left;Right;Strengthening;10 reps    Hamstring Limitations  double blue TB, RLE sliding on towel     Abd/Adduction Limitations  hip adduction/IR with double red TB resistance 2x10 reps             PT Education - 08/18/17 1115    Education provided  Yes    Education Details  importance of completing seated exercises at home that will not result in significant drops in oxygen levels     Person(s) Educated  Patient    Methods  Explanation    Comprehension  Verbalized understanding       PT Short Term Goals - 07/17/17 1128      PT SHORT TERM GOAL #1   Title  Pt will demo consistency and independence with his HEP to improve safety with functional tasks.     Time  4    Period  Weeks    Status  Partially Met      PT SHORT TERM GOAL #2   Title  The patient will have improved trunk and LE strength evident by his ability stand 3 min during a session without need for a rest break.     Baseline  up to 4 min    Time  4    Period  Weeks    Status  Achieved      PT SHORT TERM GOAL #3   Title  Pt will demo improved walking tolerance up to atleast 5 min without the need for rest breaks, to increase his independence with going to the grocery store or attending church.     Baseline  up to 1-2 min currently    Time  4    Period  Weeks    Status  On-going      PT SHORT TERM GOAL #4   Title  Pt will demo improved hip flexor muscle length to atleast 0 deg, to improve his mechanics with ambulation and decrease energy expenditure.     Baseline  lacking atleast 5 deg    Time  4    Period  Weeks    Status  On-going  PT  SHORT TERM GOAL #5   Title  Pt will demo improved functional strength evident by his ability to complete sit to stand atleast 5 consecutive reps without the need for UE support.     Baseline  minimal UE support     Time  4    Period  Weeks    Status  Achieved        PT Long Term Goals - 07/17/17 1125      PT LONG TERM GOAL #1   Title  The patient will be independent with safe self progression of HEP to improved walking distance and further conditioning at discharge.    Time  8    Period  Weeks    Status  On-going      PT LONG TERM GOAL #2   Title  Pt will demo improved LE strength and steadiness with stairs, evident by his ability to ascend/descend clinic steps with reciprocal pattern and without assistance, 2/3 trials.     Baseline  1/3 trials    Time  8    Period  Weeks    Status  Partially Met      PT LONG TERM GOAL #3   Title  Pt will complete 5x sit to stand in less than 14 sec with minimal UE support, to reflect an improvement in his functional strength and power.    Baseline  less than 10 sec with UE support, 16.8 sec without UE    Time  8    Period  Weeks    Status  Partially Met      PT LONG TERM GOAL #4   Title  Pt will demo improved berg balance score of atleast 8 points, to reflect improvements in balance and decrease risk of falling.     Baseline  improved from 27 to 35     Time  8    Period  Weeks    Status  Achieved      PT LONG TERM GOAL #5   Title  Pt will demo improved mobility and balance, evident by her ability to complete the TUG in atleast 13 sec with LRAD.     Baseline  met with UE support, unable to complete without UE support    Time  8    Period  Weeks    Status  Partially Met            Plan - 08/18/17 1146    Clinical Impression Statement  Pt arrived after 2 weeks of recovering from bronchitis. Session focused primarily on seated LE strengthening and endurance exercises with increased reps and sets. Pt has increased Rt hip pain,  limiting participation in standing exercises this session. PT encouraged pt to increase HEP adherence to avoid decline in strength, etc. and he verbalized agreement with this.     Rehab Potential  Good    Clinical Impairments Affecting Rehab Potential  (+) pt seems highly organized, (-) seldomly active at home    PT Frequency  2x / week    PT Duration  6 weeks    PT Treatment/Interventions  ADLs/Self Care Home Management;Moist Heat;Cryotherapy;Balance training;Therapeutic exercise;Therapeutic activities;Functional mobility training;Stair training;Gait training;Neuromuscular re-education;Patient/family education;Manual techniques;Passive range of motion;Dry needling    PT Next Visit Plan  follow up on sit to stand; f/u with O2 supplement/PCP; continue to incorporate walking intervals into session; progression of balance activity and posture/strength    PT Home Exercise Plan  needs next session: LE strength and flexibility  Consulted and Agree with Plan of Care  Patient       Patient will benefit from skilled therapeutic intervention in order to improve the following deficits and impairments:  Abnormal gait, Decreased activity tolerance, Decreased balance, Decreased safety awareness, Difficulty walking, Impaired flexibility, Obesity, Hypomobility, Decreased strength, Decreased range of motion, Decreased endurance, Decreased mobility, Postural dysfunction, Improper body mechanics  Visit Diagnosis: Muscle weakness (generalized)  Other abnormalities of gait and mobility  Unsteadiness on feet     Problem List Patient Active Problem List   Diagnosis Date Noted  . Paroxysmal (Persistent) atrial fibrillation (HCC) -- CHA2DS2-VASc Score; On Eliquis 10/01/2015  . Persistent atrial fibrillation ([place]):  CHA2DS2-VASc Score 4   . Exertional dyspnea and fatigue 08/13/2013  . CAD S/P percutaneous coronary angioplasty   . Hyperlipidemia with target LDL less than 70   . Essential hypertension   .  Obesity (BMI 30.0-34.9)   . Myocardial infarction; history of   . Osteoarthritis of right knee 05/11/2012    11:57 AM,08/18/17 Sherol Dade PT, DPT [place] [facility] [address] [place], [place], [address] Phone: [phone]   Fax:  [phone]  Name: CADENCE HASLAM MRN: [id number] Date of Birth: 02/22/1930

## 2017-08-21 ENCOUNTER — Ambulatory Visit: Payer: Medicare Other | Admitting: Physical Therapy

## 2017-08-21 ENCOUNTER — Encounter: Payer: Self-pay | Admitting: Physical Therapy

## 2017-08-21 DIAGNOSIS — M6281 Muscle weakness (generalized): Secondary | ICD-10-CM

## 2017-08-21 DIAGNOSIS — R2681 Unsteadiness on feet: Secondary | ICD-10-CM | POA: Diagnosis not present

## 2017-08-21 DIAGNOSIS — R2689 Other abnormalities of gait and mobility: Secondary | ICD-10-CM | POA: Diagnosis not present

## 2017-08-21 NOTE — Telephone Encounter (Signed)
RN spoke to [place]-   patient was ordered home oxygen by primary.   [facility] was looking for diagnosis to support the order.  Patient from heart standpoint does not have an diagnosis for use.   Patient may  possible need to see a pulmonologist - for this issue .   RN  informed patient that Dr Ellyn Hack has not document ation  - other than dyspnea which symptom not a diagnosis. Advise patient to contact primary.    Patient states he had a fall early this afternoon and required for someone to come an assist him up. He states legs just "give out ". He states he did not pass out .  RN instructed patient to contact  Primary . Patient states he would

## 2017-08-21 NOTE — Therapy (Signed)
[facility] [address] [place], [place], [address] Phone: [phone]   Fax:  [phone]  Physical Therapy Treatment  Patient Details  Name: Jerry [name] MRN: [id number] Date of Birth: 1929/06/19 Referring Provider: [name], MD   Encounter Date: 08/21/2017  PT End of Session - 08/21/17 1321    Visit Number  16    Date for PT Re-Evaluation  08/30/17    Authorization Type  Medicare A and B    Authorization Time Period  1218/19 to 07/18/17, new: 07/19/17 to 08/30/17    Authorization - Visit Number  16    Authorization - Number of Visits  30    PT Start Time  1102    PT Stop Time  6962    PT Time Calculation (min)  40 min    Activity Tolerance  No increased pain;Patient tolerated treatment well    Behavior During Therapy  [facility] for tasks assessed/performed       Past Medical History:  Diagnosis Date  . Anxiety   . CAD S/P percutaneous coronary angioplasty 1996, 2009, 01/2010   PTCA of L Cx 1996; BMS-RCA [facility]) 2009; Staged PCI RCA (70% ISR) --> 3.0 mm x 23 mm BMS, staged PCI Diag - 2.0 mm x 12 mm Mini Vision BMS (2.25 mm)  . Cataracts, bilateral    immature  . Chronic back pain    scoliosis--pt states unable to have surgery bc of age  . GERD (gastroesophageal reflux disease)    takes Nexium daily  . History of colon polyps   . Hyperlipidemia   . Hypertension   . Myocardial infarction [facility]) 1996, 2009   x 2;last time was about 8-72yr ago   . Obesity (BMI 30.0-34.9)   . OSA (obstructive sleep apnea)   . Osteoarthritis of both knees   . Peripheral edema   . Persistent atrial fibrillation ([facility]   . Prostate cancer ([facility] 2005   seed implant    Past Surgical History:  Procedure Laterality Date  . CARDIOVERSION N/A 06/12/2015   Procedure: CARDIOVERSION;  Surgeon: MSanda Klein MD;  [facility];  Service: Cardiovascular;  Laterality: N/A;  . CARDIOVERSION N/A 08/02/2015   Procedure:  CARDIOVERSION;  Surgeon: JThompson Grayer MD;  [facility]  Service: Cardiovascular;  Laterality: N/A;  . CHOLECYSTECTOMY    . COLONOSCOPY    . CORONARY ANGIOPLASTY WITH STENT PLACEMENT  1996, 2009, August 2011   PTCA of L Cx 1996; BMS-RCA ([facility] 2009; Staged PCI RCA (70% ISR) --> 3.0 mm x 23 mm BMS, staged PCI Diag - 2.0 mm x 12 mm Mini Vision BMS (2.25 mm)  . cyst removed  in college  . NEUROPLASTY / TRANSPOSITION MEDIAN NERVE AT CARPAL TUNNEL BILATERAL    . NM MYOVIEW LTD  November 2013   EF 53%; fixed mid inferior-inferolateral defect, infarct with no ischemia.  .Marland KitchenNM MYOVIEW LTD  11/24/2010   ejection fraction 58%,left ventricle is normal size ,no evidence of inducible myocardial ischemia  . right knee arthrsoscopy  166yrago  . right trigger finger    . seeds placed into prostate   2005  . TEE WITHOUT CARDIOVERSION N/A 06/12/2015   Procedure: TRANSESOPHAGEAL ECHOCARDIOGRAM (TEE);  Surgeon: MiSanda KleinMD;  [facility];  Service: Cardiovascular: EF 55-60%.No LA or LAA thrombus. Normal Peal PAP ~33 mmHg.  . Marland KitchenOTAL KNEE ARTHROPLASTY  05/09/2012   Procedure: TOTAL KNEE ARTHROPLASTY;  Surgeon: FrKerin SalenMD;  [facility];  Service: Orthopedics;  Laterality: Right;  . TRANSTHORACIC ECHOCARDIOGRAM  06/2015   EF 60-65%. Mild LA dilation. Normal PA pressures: 32 mmHg. Aortic sclerosis but no stenosis.  [name] wisdom teeth extraccted      There were no vitals filed for this visit.  Subjective Assessment - 08/21/17 1114    Subjective  Pt reports that he has had increased drainage the past several days. He tried completing his HEP as instructed over the weekend.     Pertinent History  persistent A-fib, history of MI, HLD, exertional dyspnea and fatigue, HTN    Patient Stated Goals  improve strength and balance     Currently in Pain?  No/denies               [facility] Adult PT Treatment/Exercise - 08/21/17 0001      Exercises   Other Exercises   reclined: chin tucks  2x10 reps (heavy cues to improve technique), Scap squeeze 2x10 reps       Knee/Hip Exercises: Seated   Other Seated Knee/Hip Exercises  rows with blue TB 2x15 reps     Other Seated Knee/Hip Exercises  seated alternating LE marching 3x30 sec      Knee/Hip Exercises: Supine   Straight Leg Raises  3 sets;5 reps;Both reclined sitting, AAROM with RLE    Other Supine Knee/Hip Exercises  hip abduction slide, Lt and Rt 2x10 reps           Balance Exercises - 08/21/17 1133      Balance Exercises: Standing   Standing Eyes Opened  Narrow base of support (BOS) trunk rotation Lt/Rt 2x5 reps.     Standing Eyes Closed  2 reps;30 secs;Narrow base of support (BOS) focus on proper breathing.         PT Education - 08/21/17 1321    Education provided  Yes    Education Details  technique with therex     [employer]) Educated  Patient    Methods  Verbal cues;Explanation    Comprehension  Verbalized understanding;Returned demonstration       PT Short Term Goals - 07/17/17 1128      PT SHORT TERM GOAL #1   Title  Pt will demo consistency and independence with his HEP to improve safety with functional tasks.     Time  4    Period  Weeks    Status  Partially Met      PT SHORT TERM GOAL #2   Title  The patient will have improved trunk and LE strength evident by his ability stand 3 min during a session without need for a rest break.     Baseline  up to 4 min    Time  4    Period  Weeks    Status  Achieved      PT SHORT TERM GOAL #3   Title  Pt will demo improved walking tolerance up to atleast 5 min without the need for rest breaks, to increase his independence with going to the grocery store or attending church.     Baseline  up to 1-2 min currently    Time  4    Period  Weeks    Status  On-going      PT SHORT TERM GOAL #4   Title  Pt will demo improved hip flexor muscle length to atleast 0 deg, to improve his mechanics with ambulation and decrease energy expenditure.     Baseline   lacking atleast 5  deg    Time  4    Period  Weeks    Status  On-going      PT SHORT TERM GOAL #5   Title  Pt will demo improved functional strength evident by his ability to complete sit to stand atleast 5 consecutive reps without the need for UE support.     Baseline  minimal UE support     Time  4    Period  Weeks    Status  Achieved        PT Long Term Goals - 07/17/17 1125      PT LONG TERM GOAL #1   Title  The patient will be independent with safe self progression of HEP to improved walking distance and further conditioning at discharge.    Time  8    Period  Weeks    Status  On-going      PT LONG TERM GOAL #2   Title  Pt will demo improved LE strength and steadiness with stairs, evident by his ability to ascend/descend clinic steps with reciprocal pattern and without assistance, 2/3 trials.     Baseline  1/3 trials    Time  8    Period  Weeks    Status  Partially Met      PT LONG TERM GOAL #3   Title  Pt will complete 5x sit to stand in less than 14 sec with minimal UE support, to reflect an improvement in his functional strength and power.    Baseline  less than 10 sec with UE support, 16.8 sec without UE    Time  8    Period  Weeks    Status  Partially Met      PT LONG TERM GOAL #4   Title  Pt will demo improved berg balance score of atleast 8 points, to reflect improvements in balance and decrease risk of falling.     Baseline  improved from 27 to 35     Time  8    Period  Weeks    Status  Achieved      PT LONG TERM GOAL #5   Title  Pt will demo improved mobility and balance, evident by her ability to complete the TUG in atleast 13 sec with LRAD.     Baseline  met with UE support, unable to complete without UE support    Time  8    Period  Weeks    Status  Partially Met            Plan - 08/21/17 1322    Clinical Impression Statement  Pt has continued reports of fatigue and weakness. Session focused again on seated therex to improve LE strength and  posture. Therapist provided intermittent posture cuing and proper breathing instruction to improve lung expansion. Pt unable to complete encouraged repetitions with standing balance activity due to reports of high levels of fatigue. Will continue to monitor pt response with activity and progress therex/balance activity as able.     Rehab Potential  Good    Clinical Impairments Affecting Rehab Potential  (+) pt seems highly organized, (-) seldomly active at home    PT Frequency  2x / week    PT Duration  6 weeks    PT Treatment/Interventions  ADLs/Self Care Home Management;Moist Heat;Cryotherapy;Balance training;Therapeutic exercise;Therapeutic activities;Functional mobility training;Stair training;Gait training;Neuromuscular re-education;Patient/family education;Manual techniques;Passive range of motion;Dry needling    PT Next Visit Plan  f/u with O2 supplement/PCP; progression of  balance activity and posture/strength (seated, add standing LE strength next session if able)    Recommended Other Services  MD signed orders     Consulted and Agree with Plan of Care  Patient       Patient will benefit from skilled therapeutic intervention in order to improve the following deficits and impairments:  Abnormal gait, Decreased activity tolerance, Decreased balance, Decreased safety awareness, Difficulty walking, Impaired flexibility, Obesity, Hypomobility, Decreased strength, Decreased range of motion, Decreased endurance, Decreased mobility, Postural dysfunction, Improper body mechanics  Visit Diagnosis: Muscle weakness (generalized)  Other abnormalities of gait and mobility  Unsteadiness on feet     Problem List Patient Active Problem List   Diagnosis Date Noted  . Paroxysmal (Persistent) atrial fibrillation (HCC) -- CHA2DS2-VASc Score; On Eliquis 10/01/2015  . Persistent atrial fibrillation ([employer]):  CHA2DS2-VASc Score 4   . Exertional dyspnea and fatigue 08/13/2013  . CAD S/P percutaneous  coronary angioplasty   . Hyperlipidemia with target LDL less than 70   . Essential hypertension   . Obesity (BMI 30.0-34.9)   . Myocardial infarction; history of   . Osteoarthritis of right knee 05/11/2012    1:28 PM,08/21/17 Sherol Dade PT, DPT [place] [facility] [address] [place], [place], [address] Phone: [phone]   Fax:  [phone]  Name: JIBRI SCHRIEFER MRN: [id number] Date of Birth: May 22, 1930

## 2017-08-22 ENCOUNTER — Other Ambulatory Visit: Payer: Self-pay | Admitting: Family Medicine

## 2017-08-22 ENCOUNTER — Ambulatory Visit
Admission: RE | Admit: 2017-08-22 | Discharge: 2017-08-22 | Disposition: A | Discharge status: Home / Self Care | Payer: Medicare Other | Source: Ambulatory Visit | Attending: Family Medicine | Admitting: Family Medicine

## 2017-08-22 DIAGNOSIS — R0600 Dyspnea, unspecified: Secondary | ICD-10-CM

## 2017-08-22 DIAGNOSIS — R05 Cough: Secondary | ICD-10-CM | POA: Diagnosis not present

## 2017-08-22 DIAGNOSIS — E669 Obesity, unspecified: Secondary | ICD-10-CM | POA: Diagnosis not present

## 2017-08-22 DIAGNOSIS — R5383 Other fatigue: Secondary | ICD-10-CM | POA: Diagnosis not present

## 2017-08-25 ENCOUNTER — Telehealth: Payer: Self-pay | Admitting: Pulmonary Disease

## 2017-08-25 ENCOUNTER — Encounter: Payer: Medicare Other | Admitting: Physical Therapy

## 2017-08-25 NOTE — Telephone Encounter (Signed)
Spoke with Luanne at [place]  They did an ONO on the pt and he was prescribed o2  He is paying out of pocket for it, since at the time he did not have more than one comorbidity listed  Then they got his labs back and discovered that he has CHF  This will help pay for his o2, but only if we can get him qualified for daytime o2 as well per Luanne  Therefore, she wants [place] to walk him here in the office to see if he will qualify  I have added this to his appt note  He is scheduled for 08/29/17

## 2017-08-28 ENCOUNTER — Ambulatory Visit: Payer: Medicare Other | Admitting: Physical Therapy

## 2017-08-28 ENCOUNTER — Other Ambulatory Visit: Payer: Self-pay | Admitting: Cardiology

## 2017-08-28 ENCOUNTER — Encounter: Payer: Self-pay | Admitting: Physical Therapy

## 2017-08-28 VITALS — HR 74

## 2017-08-28 DIAGNOSIS — M6281 Muscle weakness (generalized): Secondary | ICD-10-CM | POA: Diagnosis not present

## 2017-08-28 DIAGNOSIS — R2689 Other abnormalities of gait and mobility: Secondary | ICD-10-CM | POA: Diagnosis not present

## 2017-08-28 DIAGNOSIS — R2681 Unsteadiness on feet: Secondary | ICD-10-CM | POA: Diagnosis not present

## 2017-08-28 NOTE — Therapy (Addendum)
[facility] [address] [place], [place], [address] Phone: [phone]   Fax:  [phone]  Physical Therapy Treatment/Discharge  Patient Details  Name: Jerry [name] MRN: [id number] Date of Birth: 1930-03-17 Referring Provider: [name], MD    Encounter Date: 08/28/2017  PT End of Session - 08/28/17 1146    Visit Number  17    Date for PT Re-Evaluation  08/30/17    Authorization Type  Medicare A and B    Authorization Time Period  1218/19 to 07/18/17, new: 07/19/17 to 08/30/17    Authorization - Visit Number  16    Authorization - Number of Visits  30    PT Start Time  1110 pt arrived late    PT Stop Time  1140    PT Time Calculation (min)  30 min    Activity Tolerance  No increased pain;Patient tolerated treatment well    Behavior During Therapy  [facility] for tasks assessed/performed       Past Medical History:  Diagnosis Date  . Anxiety   . CAD S/P percutaneous coronary angioplasty 1996, 2009, 01/2010   PTCA of L Cx 1996; BMS-RCA [facility]) 2009; Staged PCI RCA (70% ISR) --> 3.0 mm x 23 mm BMS, staged PCI Diag - 2.0 mm x 12 mm Mini Vision BMS (2.25 mm)  . Cataracts, bilateral    immature  . Chronic back pain    scoliosis--pt states unable to have surgery bc of age  . GERD (gastroesophageal reflux disease)    takes Nexium daily  . History of colon polyps   . Hyperlipidemia   . Hypertension   . Myocardial infarction [facility]) 1996, 2009   x 2;last time was about 8-39yr ago   . Obesity (BMI 30.0-34.9)   . OSA (obstructive sleep apnea)   . Osteoarthritis of both knees   . Peripheral edema   . Persistent atrial fibrillation ([facility]   . Prostate cancer ([facility] 2005   seed implant    Past Surgical History:  Procedure Laterality Date  . CARDIOVERSION N/A 06/12/2015   Procedure: CARDIOVERSION;  Surgeon: MSanda Klein MD;  [facility];  Service: Cardiovascular;  Laterality: N/A;  . CARDIOVERSION N/A  08/02/2015   Procedure: CARDIOVERSION;  Surgeon: JThompson Grayer MD;  [place] [facility]  Service: Cardiovascular;  Laterality: N/A;  . CHOLECYSTECTOMY    . COLONOSCOPY    . CORONARY ANGIOPLASTY WITH STENT PLACEMENT  1996, 2009, August 2011   PTCA of L Cx 1996; BMS-RCA ([facility] 2009; Staged PCI RCA (70% ISR) --> 3.0 mm x 23 mm BMS, staged PCI Diag - 2.0 mm x 12 mm Mini Vision BMS (2.25 mm)  . cyst removed  in college  . NEUROPLASTY / TRANSPOSITION MEDIAN NERVE AT CARPAL TUNNEL BILATERAL    . NM MYOVIEW LTD  November 2013   EF 53%; fixed mid inferior-inferolateral defect, infarct with no ischemia.  .Marland KitchenNM MYOVIEW LTD  11/24/2010   ejection fraction 58%,left ventricle is normal size ,no evidence of inducible myocardial ischemia  . right knee arthrsoscopy  118yrago  . right trigger finger    . seeds placed into prostate   2005  . TEE WITHOUT CARDIOVERSION N/A 06/12/2015   Procedure: TRANSESOPHAGEAL ECHOCARDIOGRAM (TEE);  Surgeon: MiSanda KleinMD;  [facility];  Service: Cardiovascular: EF 55-60%.No LA or LAA thrombus. Normal Peal PAP ~33 mmHg.  . Marland KitchenOTAL KNEE ARTHROPLASTY  05/09/2012   Procedure: TOTAL KNEE ARTHROPLASTY;  Surgeon: FrKerin Salen  MD;  Location: [place];  Service: Orthopedics;  Laterality: Right;  . TRANSTHORACIC ECHOCARDIOGRAM  06/2015   EF 60-65%. Mild LA dilation. Normal PA pressures: 32 mmHg. Aortic sclerosis but no stenosis.  [name] wisdom teeth extraccted      Vitals:   08/28/17 1124  Pulse: 74    Subjective Assessment - 08/28/17 1112    Subjective  increased SOB; goes to pulmonologist tomorrow PM.  c/o fatige.  only able to due exercises "a little bit, not much." reports this is due to fatigue and some non compliance.    Pertinent History  persistent A-fib, history of MI, HLD, exertional dyspnea and fatigue, HTN    Patient Stated Goals  improve strength and balance     Currently in Pain?  No/denies         [facility] PT Assessment - 08/28/17 1126      Assessment    Medical Diagnosis  Balance Disorder     Referring Provider  [name], MD       Transfers   Five time sit to stand comments   29.66 sec with UE support      Timed Up and Go Test   TUG Comments  21.71 sec with SPC            No data recorded       [facility] Adult PT Treatment/Exercise - 08/28/17 1126      Self-Care   Self-Care  Other Self-Care Comments    Other Self-Care Comments   extensive discussion with pt about current condition and decline in function.  pt with recent onset of new SOB with O2 dropping (per his report; no pulse ox available today), and follows up with pulmonology tomorrow.  recommend at this time pt d/c from PT services due to decline in function/medical status and recommend he follow up with MDs for additional recommendations.  recommend return to PT when medically stable.             PT Education - 08/28/17 1144    Education provided  Yes    Education Details  see self care    Person(s) Educated  Patient    Methods  Explanation    Comprehension  Verbalized understanding       PT Short Term Goals - 07/17/17 1128      PT SHORT TERM GOAL #1   Title  Pt will demo consistency and independence with his HEP to improve safety with functional tasks.     Time  4    Period  Weeks    Status  Partially Met      PT SHORT TERM GOAL #2   Title  The patient will have improved trunk and LE strength evident by his ability stand 3 min during a session without need for a rest break.     Baseline  up to 4 min    Time  4    Period  Weeks    Status  Achieved      PT SHORT TERM GOAL #3   Title  Pt will demo improved walking tolerance up to atleast 5 min without the need for rest breaks, to increase his independence with going to the grocery store or attending church.     Baseline  up to 1-2 min currently    Time  4    Period  Weeks    Status  On-going      PT SHORT TERM GOAL #4   Title  Pt  will demo improved hip flexor muscle length to atleast 0 deg,  to improve his mechanics with ambulation and decrease energy expenditure.     Baseline  lacking atleast 5 deg    Time  4    Period  Weeks    Status  On-going      PT SHORT TERM GOAL #5   Title  Pt will demo improved functional strength evident by his ability to complete sit to stand atleast 5 consecutive reps without the need for UE support.     Baseline  minimal UE support     Time  4    Period  Weeks    Status  Achieved        PT Long Term Goals - 08/28/17 1146      PT LONG TERM GOAL #1   Title  The patient will be independent with safe self progression of HEP to improved walking distance and further conditioning at discharge.    Time  8    Period  Weeks    Status  Not Met      PT LONG TERM GOAL #2   Title  Pt will demo improved LE strength and steadiness with stairs, evident by his ability to ascend/descend clinic steps with reciprocal pattern and without assistance, 2/3 trials.     Baseline  1/3 trials    Time  8    Period  Weeks    Status  Partially Met      PT LONG TERM GOAL #3   Title  Pt will complete 5x sit to stand in less than 14 sec with minimal UE support, to reflect an improvement in his functional strength and power.    Baseline  08/28/17: significant decline to 29 sec    Time  8    Period  Weeks    Status  Partially Met      PT LONG TERM GOAL #4   Title  Pt will demo improved berg balance score of atleast 8 points, to reflect improvements in balance and decrease risk of falling.     Baseline  improved from 27 to 35     Time  8    Period  Weeks    Status  Achieved      PT LONG TERM GOAL #5   Title  Pt will demo improved mobility and balance, evident by her ability to complete the TUG in atleast 13 sec with LRAD.     Baseline  08/28/17: significant decline now at 21 seconds    Time  8    Period  Weeks    Status  Not Met            Plan - 08/28/17 1147    Clinical Impression Statement  Pt continues to have increased fatigue and SOB affecting  ability to fully participate in PT.  Pt also with overall decline in objective measures due to fatigue and SOB.  Long discussion with pt about lack of progress and PT recommendations.  Pt also reports minimal compliance with HEP, due to fatigue and decreased motivation.  Recommended pt follow up with MDs regarding increased SOB and that he work on increasing his compliance with his HEP as his medical status allows.  At this time feel pt needs to d/c from PT and return back to MD for further assessment due to decline in function.  Pt advised to get new referral to return to PT when medically cleared by MD.  Rehab Potential  Good    Clinical Impairments Affecting Rehab Potential  (+) pt seems highly organized, (-) seldomly active at home    PT Frequency  2x / week    PT Duration  6 weeks    PT Treatment/Interventions  ADLs/Self Care Home Management;Moist Heat;Cryotherapy;Balance training;Therapeutic exercise;Therapeutic activities;Functional mobility training;Stair training;Gait training;Neuromuscular re-education;Patient/family education;Manual techniques;Passive range of motion;Dry needling    PT Next Visit Plan  d/c PT today    Consulted and Agree with Plan of Care  Patient       Patient will benefit from skilled therapeutic intervention in order to improve the following deficits and impairments:  Abnormal gait, Decreased activity tolerance, Decreased balance, Decreased safety awareness, Difficulty walking, Impaired flexibility, Obesity, Hypomobility, Decreased strength, Decreased range of motion, Decreased endurance, Decreased mobility, Postural dysfunction, Improper body mechanics  Visit Diagnosis: Muscle weakness (generalized)  Other abnormalities of gait and mobility  Unsteadiness on feet     Problem List Patient Active Problem List   Diagnosis Date Noted  . Paroxysmal (Persistent) atrial fibrillation (HCC) -- CHA2DS2-VASc Score; On Eliquis 10/01/2015  . Persistent atrial  fibrillation ([place]):  CHA2DS2-VASc Score 4   . Exertional dyspnea and fatigue 08/13/2013  . CAD S/P percutaneous coronary angioplasty   . Hyperlipidemia with target LDL less than 70   . Essential hypertension   . Obesity (BMI 30.0-34.9)   . Myocardial infarction; history of   . Osteoarthritis of right knee 05/11/2012      Laureen Abrahams, PT, DPT 08/28/17 11:55 AM    [facility] [address] [place], [place], [address] Phone: [phone]   Fax:  [phone]  Name: Jerry [name] MRN: [id number] Date of Birth: 19-Aug-1929       PHYSICAL THERAPY DISCHARGE SUMMARY  Visits from Start of Care: 17  Current functional level related to goals / functional outcomes: See above   Remaining deficits: See above   Education / Equipment: HEP  Plan: Patient agrees to discharge.  Patient goals were partially met. Patient is being discharged due to a change in medical status.  ?????      Laureen Abrahams, PT, DPT 08/28/17 12:01 PM  [facility] [place], [place] [address]  [phone] (office) [phone] (fax)

## 2017-08-28 NOTE — Telephone Encounter (Signed)
ATC Luanne since RA will not be in the office this week. Will need to see if patient needs to see RA specifically, or will another provider be ok. Left message with the receptionist to have Luanne call me back.

## 2017-08-29 ENCOUNTER — Institutional Professional Consult (permissible substitution): Payer: Medicare Other | Admitting: Pulmonary Disease

## 2017-08-29 NOTE — Telephone Encounter (Signed)
Pt is scheduled for a consult with Dr. Vaughan Browner on 09/15/17. Qualifying walk will be done then.

## 2017-09-01 ENCOUNTER — Ambulatory Visit: Payer: Medicare Other | Admitting: Physical Therapy

## 2017-09-01 ENCOUNTER — Encounter: Payer: Medicare Other | Admitting: Physical Therapy

## 2017-09-02 ENCOUNTER — Other Ambulatory Visit: Payer: Self-pay | Admitting: Cardiology

## 2017-09-02 DIAGNOSIS — I4819 Other persistent atrial fibrillation: Secondary | ICD-10-CM

## 2017-09-04 NOTE — Telephone Encounter (Signed)
REFILL 

## 2017-09-05 ENCOUNTER — Other Ambulatory Visit: Payer: Self-pay | Admitting: Cardiology

## 2017-09-05 NOTE — Telephone Encounter (Signed)
Rx has been sent to the pharmacy electronically. ° °

## 2017-09-06 ENCOUNTER — Encounter: Payer: Self-pay | Admitting: Pulmonary Disease

## 2017-09-06 ENCOUNTER — Ambulatory Visit (INDEPENDENT_AMBULATORY_CARE_PROVIDER_SITE_OTHER): Payer: Medicare Other | Admitting: Pulmonary Disease

## 2017-09-06 VITALS — BP 118/70 | HR 70 | Ht 70.0 in | Wt 231.0 lb

## 2017-09-06 DIAGNOSIS — G4734 Idiopathic sleep related nonobstructive alveolar hypoventilation: Secondary | ICD-10-CM

## 2017-09-06 DIAGNOSIS — I2510 Atherosclerotic heart disease of native coronary artery without angina pectoris: Secondary | ICD-10-CM

## 2017-09-06 DIAGNOSIS — R0609 Other forms of dyspnea: Secondary | ICD-10-CM | POA: Diagnosis not present

## 2017-09-06 NOTE — Progress Notes (Signed)
Subjective:    Patient ID: Jerry [name], male    DOB: 06-27-1929, 84 y.o.   MRN: [id number]  HPI  84 year old obese man presents for evaluation of hypoxia. He is accompanied by his 54 year old wife [name].  He has undergone some issues with deconditioning and was undergoing rehab and was found to desaturate.  Nocturnal oximetry was obtained by his PCP which showed desaturations and he was provided with an oxygen concentrator to use at night.  This seems to have helped him subjectively and he feels more refreshed in the morning and feels like he has more energy.  Unfortunately a qualifying diagnosis could not be obtained for the centimeters in and DME was asking him to pay out of pocket.  His PCP then obtained a BNP test which was noted to be 172 and he was given a diagnosis of congestive heart failure.  I have reviewed his prior echo and his cardiologist note from 03/2017, [name] whom he sees for coronary artery disease and atrial fibrillation. He does have chronic pedal edema but there is no history of pulmonary edema or overt symptoms of heart failure.  Atrial fibrillation is controlled with Tikosyn, his last stent placement was in 2011 and he does not have ongoing ischemic heart disease. He is a lifetime never smoker and does not have a history of asthma.  I have reviewed all his prior chest x-rays including more recent ones in February and March 2019 which all seem to show some chronic interstitial infiltrates.  Concern was raised about obstructive sleep apnea from his ONO recording.  His wife sleeps in a different room so no bed partner history is available but she does reporting excessive daytime somnolence. Epworth sleepiness score is 13.  Bedtime is between midnight to 1 AM, sleep latency is variable, he has been sleeping in a recliner for many years due to back pain, reports 2-3 nocturnal awakenings including nocturia and is out of bed by 8 AM feeling tired without dryness of mouth  or headaches  Significant tests/ events reviewed  Ambulatory saturation-dropped to 89% on walking and recovered on resting. Echo 2017 shows normal LVEF, RVSP 32  Past Medical History:  Diagnosis Date  . Anxiety   . CAD S/P percutaneous coronary angioplasty 1996, 2009, 01/2010   PTCA of L Cx 1996; BMS-RCA [facility]) 2009; Staged PCI RCA (70% ISR) --> 3.0 mm x 23 mm BMS, staged PCI Diag - 2.0 mm x 12 mm Mini Vision BMS (2.25 mm)  . Cataracts, bilateral    immature  . Chronic back pain    scoliosis--pt states unable to have surgery bc of age  . GERD (gastroesophageal reflux disease)    takes Nexium daily  . History of colon polyps   . Hyperlipidemia   . Hypertension   . Myocardial infarction [facility]) 1996, 2009   x 2;last time was about 8-40yrs ago   . Obesity (BMI 30.0-34.9)   . OSA (obstructive sleep apnea)   . Osteoarthritis of both knees   . Peripheral edema   . Persistent atrial fibrillation ([place])   . Prostate cancer ([place]) 2005   seed implant    Past Surgical History:  Procedure Laterality Date  . CARDIOVERSION N/A 06/12/2015   Procedure: CARDIOVERSION;  Surgeon: Sanda Klein, MD;  Location: [place] [facility];  Service: Cardiovascular;  Laterality: N/A;  . CARDIOVERSION N/A 08/02/2015   Procedure: CARDIOVERSION;  Surgeon: [name], MD;  Location: [place];  Service: Cardiovascular;  Laterality: N/A;  . CHOLECYSTECTOMY    .  COLONOSCOPY    . CORONARY ANGIOPLASTY WITH STENT PLACEMENT  1996, 2009, August 2011   PTCA of L Cx 1996; BMS-RCA [facility]) 2009; Staged PCI RCA (70% ISR) --> 3.0 mm x 23 mm BMS, staged PCI Diag - 2.0 mm x 12 mm Mini Vision BMS (2.25 mm)  . cyst removed  in college  . NEUROPLASTY / TRANSPOSITION MEDIAN NERVE AT CARPAL TUNNEL BILATERAL    . NM MYOVIEW LTD  November 2013   EF 53%; fixed mid inferior-inferolateral defect, infarct with no ischemia.  [name] NM MYOVIEW LTD  11/24/2010   ejection fraction 58%,left ventricle is normal size ,no evidence of inducible myocardial  ischemia  . right knee arthrsoscopy  66yrs ago  . right trigger finger    . seeds placed into prostate   2005  . TEE WITHOUT CARDIOVERSION N/A 06/12/2015   Procedure: TRANSESOPHAGEAL ECHOCARDIOGRAM (TEE);  Surgeon: Sanda Klein, MD;  Location: [facility] ENDOSCOPY;  Service: Cardiovascular: EF 55-60%.No LA or LAA thrombus. Normal Peal PAP ~33 mmHg.  [name] TOTAL KNEE ARTHROPLASTY  05/09/2012   Procedure: TOTAL KNEE ARTHROPLASTY;  Surgeon: Kerin Salen, MD;  Location: [place];  Service: Orthopedics;  Laterality: Right;  . TRANSTHORACIC ECHOCARDIOGRAM  06/2015   EF 60-65%. Mild LA dilation. Normal PA pressures: 32 mmHg. Aortic sclerosis but no stenosis.  [name] wisdom teeth extraccted      Allergies  Allergen Reactions  . Crab [Shellfish Allergy] Rash  . Hydrocodone Hives  . Adhesive [Tape] Rash  . Oxycodone Rash    Social History   Socioeconomic History  . Marital status: Married    Spouse name: Not on file  . Number of children: Not on file  . Years of education: Not on file  . Highest education level: Not on file  Occupational History  . Occupation: Retired  Scientific laboratory technician  . Financial resource strain: Not on file  . Food insecurity:    Worry: Not on file    Inability: Not on file  . Transportation needs:    Medical: Not on file    Non-medical: Not on file  Tobacco Use  . Smoking status: Former Smoker    Types: Pipe    Last attempt to quit: 03/12/1993    Years since quitting: 24.5  . Smokeless tobacco: Never Used  . Tobacco comment: quit smoking pipe about 76yrs ago  Substance and Sexual Activity  . Alcohol use: Yes    Comment: glass of wine daily  . Drug use: No  . Sexual activity: Not Currently  Lifestyle  . Physical activity:    Days per week: Not on file    Minutes per session: Not on file  . Stress: Not on file  Relationships  . Social connections:    Talks on phone: Not on file    Gets together: Not on file    Attends religious service: Not on file    Active member of  club or organization: Not on file    Attends meetings of clubs or organizations: Not on file    Relationship status: Not on file  . Intimate partner violence:    Fear of current or ex partner: Not on file    Emotionally abused: Not on file    Physically abused: Not on file    Forced sexual activity: Not on file  Other Topics Concern  . Not on file  Social History Narrative   He is a married father of 2, grandfather of 2. He exercises at least 3  to 4 times a week but does something 6 to 7 days a week. He does not smoke. Drinks occasional alcohol.      Family History  Problem Relation Age of Onset  . Cancer Mother 57       abdomen died in 39s  . Heart attack Father 30       died in 21s       Review of Systems  Positive for shortness of breath with walking, chronic pedal edema  neg for any significant sore throat, dysphagia, itching, sneezing, nasal congestion or excess/ purulent secretions, fever, chills, sweats, unintended wt loss, pleuritic or exertional cp, hempoptysis, orthopnea pnd or change in chronic leg swelling.   Also denies presyncope, palpitations, heartburn, abdominal pain, nausea, vomiting, diarrhea or change in bowel or urinary habits, dysuria,hematuria, rash, arthralgias, visual complaints, headache, numbness weakness or ataxia.      Objective:   Physical Exam  Gen. Pleasant, elderly,obese, in no distress, normal affect ENT - no lesions, no post nasal drip, class 2-3 airway Neck: No JVD, no thyromegaly, no carotid bruits Lungs: no use of accessory muscles, no dullness to percussion, bibasal fine rales no rhonchi  Cardiovascular: Rhythm regular, heart sounds  normal, no murmurs or gallops, 1+ peripheral edema Abdomen: soft and non-tender, no hepatosplenomegaly, BS normal. Musculoskeletal: No deformities, no cyanosis or clubbing Neuro:  alert, non focal, no tremors       Assessment & Plan:

## 2017-09-06 NOTE — Assessment & Plan Note (Signed)
Deconditioning is certainly a huge part.  Continue rehab efforts

## 2017-09-06 NOTE — Assessment & Plan Note (Signed)
Cause for nocturnal hypoxia is unclear.  Most likely cause would be obstructive sleep apnea. He does not seem to have overt symptoms of congestive heart failure.  His chronic pedal edema is likely on the basis of obesity and poor venous circulation.  His echo does not show any evidence of diastolic dysfunction.  His BNP is slightly high but does not seem to be in heart failure range.  He does not seem to have pulmonary hypertension.  His x-rays do show some interstitial prominence and fine bibasilar crackles do raise the question of interstitial lung disease.  We will get a high-resolution CT just to clarify.  There is also likely that he has sleep disordered breathing.  We will proceed with a home sleep test he is willing to undertake a trial of CPAP should he have severe OSA

## 2017-09-06 NOTE — Patient Instructions (Signed)
High-resolution CT scan of the chest to look for scar tissue in the lungs.  Home sleep study.  Follow-up will be based on the results of these tests. We will try to find a qualifying diagnosis for your nocturnal oxygen

## 2017-09-11 ENCOUNTER — Ambulatory Visit (INDEPENDENT_AMBULATORY_CARE_PROVIDER_SITE_OTHER)
Admission: RE | Admit: 2017-09-11 | Discharge: 2017-09-11 | Disposition: A | Discharge status: Home / Self Care | Payer: Medicare Other | Source: Ambulatory Visit | Attending: Pulmonary Disease | Admitting: Pulmonary Disease

## 2017-09-11 DIAGNOSIS — R0609 Other forms of dyspnea: Secondary | ICD-10-CM

## 2017-09-11 DIAGNOSIS — J8410 Pulmonary fibrosis, unspecified: Secondary | ICD-10-CM | POA: Diagnosis not present

## 2017-09-15 ENCOUNTER — Institutional Professional Consult (permissible substitution): Payer: Medicare Other | Admitting: Pulmonary Disease

## 2017-09-19 DIAGNOSIS — G4733 Obstructive sleep apnea (adult) (pediatric): Secondary | ICD-10-CM | POA: Diagnosis not present

## 2017-09-20 ENCOUNTER — Other Ambulatory Visit: Payer: Self-pay | Admitting: *Deleted

## 2017-09-20 DIAGNOSIS — R0609 Other forms of dyspnea: Principal | ICD-10-CM

## 2017-09-21 DIAGNOSIS — G4733 Obstructive sleep apnea (adult) (pediatric): Secondary | ICD-10-CM | POA: Diagnosis not present

## 2017-09-26 ENCOUNTER — Telehealth: Payer: Self-pay | Admitting: Pulmonary Disease

## 2017-09-26 DIAGNOSIS — G4733 Obstructive sleep apnea (adult) (pediatric): Secondary | ICD-10-CM

## 2017-09-26 NOTE — Telephone Encounter (Signed)
Spoke with patient and his wife. They are aware of the results. He wishes to proceed with the CPAP titration study. Will go ahead and place order. Explained to patient what will happen next, he verbalized understanding.   Nothing else needed at time of call.

## 2017-09-26 NOTE — Telephone Encounter (Signed)
Per RA, HST shows moderate OSA with 22 events per hour. Given O2 desat, patient will need a CPAP titration study as the next step.

## 2017-10-18 ENCOUNTER — Ambulatory Visit (HOSPITAL_BASED_OUTPATIENT_CLINIC_OR_DEPARTMENT_OTHER): Payer: Medicare Other | Attending: Pulmonary Disease | Admitting: Pulmonary Disease

## 2017-10-18 DIAGNOSIS — G4733 Obstructive sleep apnea (adult) (pediatric): Secondary | ICD-10-CM | POA: Insufficient documentation

## 2017-10-20 ENCOUNTER — Encounter: Payer: Self-pay | Admitting: Pulmonary Disease

## 2017-10-20 ENCOUNTER — Other Ambulatory Visit (INDEPENDENT_AMBULATORY_CARE_PROVIDER_SITE_OTHER): Payer: Medicare Other

## 2017-10-20 ENCOUNTER — Ambulatory Visit (INDEPENDENT_AMBULATORY_CARE_PROVIDER_SITE_OTHER): Payer: Medicare Other | Admitting: Pulmonary Disease

## 2017-10-20 DIAGNOSIS — I2510 Atherosclerotic heart disease of native coronary artery without angina pectoris: Secondary | ICD-10-CM

## 2017-10-20 DIAGNOSIS — J849 Interstitial pulmonary disease, unspecified: Secondary | ICD-10-CM | POA: Diagnosis not present

## 2017-10-20 DIAGNOSIS — G4733 Obstructive sleep apnea (adult) (pediatric): Secondary | ICD-10-CM | POA: Insufficient documentation

## 2017-10-20 DIAGNOSIS — J9611 Chronic respiratory failure with hypoxia: Secondary | ICD-10-CM | POA: Insufficient documentation

## 2017-10-20 LAB — SEDIMENTATION RATE: SED RATE: 36 mm/h — AB (ref 0–20)

## 2017-10-20 NOTE — Assessment & Plan Note (Signed)
We will get a portable oxygen when he can resume his rehab once he gets this

## 2017-10-20 NOTE — Progress Notes (Signed)
   Subjective:    Patient ID: Jerry [name], male    DOB: 1929/10/06, 84 y.o.   MRN: [id number]  HPI  84 year old obese man  for FU of hypoxia. He is accompanied by his  wife [name].    He sees [name]  for coronary artery disease and atrial fibrillation. He does have chronic pedal edema.  Atrial fibrillation is controlled with Tikosyn, his stent placement was in 2011  He is a lifetime never smoker and does not have a history of asthma. His prior chest x-rays including more recent ones in February and March 2019 which all seem to show some chronic interstitial infiltrates. We reviewed HRCT images today which shows mild basilar ILD He continues to be shortness of breath. On walking 1 lap saturation dropped to 88%  We reviewed sleep study results, he underwent titration study and results are not available to me at the time of dictation he had to drop out of pulmonary rehab due to Oxygen desaturation   Significant tests/ events reviewed   Echo 2017 shows normal LVEF, RVSP 32  HRCT chest 09/2017 >> Mild subpleural reticulation and ground-glass, with a mild basilar predominance ? UIP  HST 09/2017 mod OSA >> 22/h  Past Medical History:  Diagnosis Date  . Anxiety   . CAD S/P percutaneous coronary angioplasty 1996, 2009, 01/2010   PTCA of L Cx 1996; BMS-RCA [facility]) 2009; Staged PCI RCA (70% ISR) --> 3.0 mm x 23 mm BMS, staged PCI Diag - 2.0 mm x 12 mm Mini Vision BMS (2.25 mm)  . Cataracts, bilateral    immature  . Chronic back pain    scoliosis--pt states unable to have surgery bc of age  . GERD (gastroesophageal reflux disease)    takes Nexium daily  . History of colon polyps   . Hyperlipidemia   . Hypertension   . Myocardial infarction [facility]) 1996, 2009   x 2;last time was about 8-50yrs ago   . Obesity (BMI 30.0-34.9)   . OSA (obstructive sleep apnea)   . Osteoarthritis of both knees   . Peripheral edema   . Persistent atrial fibrillation ([place])   . Prostate cancer  ([place]) 2005   seed implant     Review of Systems neg for any significant sore throat, dysphagia, itching, sneezing, nasal congestion or excess/ purulent secretions, fever, chills, sweats, unintended wt loss, pleuritic or exertional cp, hempoptysis, orthopnea pnd or change in chronic leg swelling.   Also denies presyncope, palpitations, heartburn, abdominal pain, nausea, vomiting, diarrhea or change in bowel or urinary habits, dysuria,hematuria, rash, arthralgias, visual complaints, headache, numbness weakness or ataxia.     Objective:   Physical Exam  Gen. Pleasant, obese, in no distress ENT - no lesions, no post nasal drip Neck: No JVD, no thyromegaly, no carotid bruits Lungs: no use of accessory muscles, no dullness to percussion, bibasal rales no rhonchi  Cardiovascular: Rhythm regular, heart sounds  normal, no murmurs or gallops, 1+ peripheral edema Musculoskeletal: No deformities, no cyanosis or clubbing , no tremors        Assessment & Plan:

## 2017-10-20 NOTE — Assessment & Plan Note (Signed)
Blood work today.  For serology Schedule PFTs. We do have qualifying diagnosis for oxygen. We will start you on portable oxygen We discussed treatment options for pulmonary fibrosis and he would like to defer at this time

## 2017-10-20 NOTE — Assessment & Plan Note (Signed)
Based on sleep study results, we will start you on CPAP therapy next week.  Weight loss encouraged, compliance with goal of at least 4-6 hrs every night is the expectation. Advised against medications with sedative side effects Cautioned against driving when sleepy - understanding that sleepiness will vary on a day to day basis

## 2017-10-20 NOTE — Addendum Note (Signed)
Addended by: Sallye Ober on: 10/20/2017 12:54 PM   Modules accepted: Orders

## 2017-10-20 NOTE — Patient Instructions (Addendum)
Blood work today. Schedule PFTs. We do have qualifying diagnosis for oxygen. We will start you on portable oxygen We discussed treatment options for pulmonary fibrosis   Based on sleep study results, we will start you on CPAP therapy next week.

## 2017-10-21 LAB — ANTI-JO 1 ANTIBODY, IGG

## 2017-10-24 ENCOUNTER — Telehealth: Payer: Self-pay | Admitting: Pulmonary Disease

## 2017-10-24 DIAGNOSIS — G4733 Obstructive sleep apnea (adult) (pediatric): Secondary | ICD-10-CM | POA: Diagnosis not present

## 2017-10-24 DIAGNOSIS — R0609 Other forms of dyspnea: Secondary | ICD-10-CM

## 2017-10-24 DIAGNOSIS — G4714 Hypersomnia due to medical condition: Secondary | ICD-10-CM | POA: Diagnosis not present

## 2017-10-24 LAB — ANA: Anti Nuclear Antibody(ANA): NEGATIVE

## 2017-10-24 LAB — CYCLIC CITRUL PEPTIDE ANTIBODY, IGG

## 2017-10-24 NOTE — Procedures (Signed)
Patient Name: Jerry [name], Dise Date: 10/18/2017 Gender: Male D.O.B: [birth year] Age (years): 21 Referring Provider: Kara Mead MD, ABSM Height (inches): 71 Interpreting Physician: Jerry Mead MD, ABSM Weight (lbs): 232 RPSGT: Jerry [name] BMI: 32 MRN: [id number] Neck Size: 18.50 <br> <br> CLINICAL INFORMATION The patient is referred for a CPAP titration to treat sleep apnea.  Date of  HST: 09/2017 mod OSA ,AHI  22/h  SLEEP STUDY TECHNIQUE As per the AASM Manual for the Scoring of Sleep and Associated Events v2.3 (April 2016) with a hypopnea requiring 4% desaturations.  The channels recorded and monitored were frontal, central and occipital EEG, electrooculogram (EOG), submentalis EMG (chin), nasal and oral airflow, thoracic and abdominal wall motion, anterior tibialis EMG, snore microphone, electrocardiogram, and pulse oximetry. Continuous positive airway pressure (CPAP) was initiated at the beginning of the study and titrated to treat sleep-disordered breathing.  MEDICATIONS Medications self-administered by patient taken the night of the study : ELIQUIS, ALUMINUM HYDROXIDE-MAGNESIUM TRISILICATE, TAMSULOSIN, VITAMIN D, TIKOSYN, LIPITOR  TECHNICIAN COMMENTS Comments added by technician: Patient had difficulty initiating sleep. Patient was restless all through the night. Patient had more than two awakenings to use the bathroom Comments added by scorer: N/A   RESPIRATORY PARAMETERS Optimal PAP Pressure (cm):  AHI at Optimal Pressure (/hr): N/A Overall Minimal O2 (%): 88.00 Supine % at Optimal Pressure (%): N/A Minimal O2 at Optimal Pressure (%): 87.00   SLEEP ARCHITECTURE The study was initiated at 11:00:14 PM and ended at 5:03:12 AM.  Sleep onset time was 44.9 minutes and the sleep efficiency was 27.6%. The total sleep time was 100 minutes.  The patient spent 16.50% of the night in stage N1 sleep, 83.50% in stage N2 sleep, 0.00% in stage N3 and 0.00% in REM.Stage  REM latency was N/A minutes  Wake after sleep onset was 218.1. Alpha intrusion was absent. Supine sleep was 0.00%.  CARDIAC DATA The 2 lead EKG demonstrated sinus rhythm. The mean heart rate was 73.21 beats per minute. Other EKG findings include: Atrial Fibrillation, PVCs.   LEG MOVEMENT DATA The total Periodic Limb Movements of Sleep (PLMS) were 16. The PLMS index was 9.60. A PLMS index of <15 is considered normal in adults.  IMPRESSIONS - An optimal PAP pressure could not be selected for this patient based on the available study data. - Mild Central Sleep Apnea was noted during this titration (CAI = 6.6/h).Centrals emerged on higher CPAP pressure - Mild oxygen desaturations were observed during this titration (min O2 = 88.00%). - The patient snored with soft snoring volume during this titration study. - 2-lead EKG demonstrated: Atrial Fibrillation, PVCs - Mild periodic limb movements were observed during this study. Arousals associated with PLMs were rare.   DIAGNOSIS - Obstructive Sleep Apnea (327.23 [G47.33 ICD-10])   RECOMMENDATIONS - Recommend a trial of Auto-CPAP 6-14 cm H2O with medium fisher paykel simplus full face mask. May need bilevel if centrals persist on autoCPAP download - Avoid alcohol, sedatives and other CNS depressants that may worsen sleep apnea and disrupt normal sleep architecture. - Sleep hygiene should be reviewed to assess factors that may improve sleep quality. - Weight management and regular exercise should be initiated or continued. - Return to Sleep Center for re-evaluation after 4 weeks of therapy   Jerry Mead MD Board Certified in [place]

## 2017-10-24 NOTE — Telephone Encounter (Signed)
I reviewed titration study. He did not sleep well Rx to DME for  Auto-CPAP 8-15 cm H2O with medium fisher paykel simplus full face mask. Download & FU in 6 wks

## 2017-10-27 ENCOUNTER — Other Ambulatory Visit: Payer: Self-pay | Admitting: Internal Medicine

## 2017-10-31 NOTE — Telephone Encounter (Signed)
Spoke with patient. He is aware of results and wishes to proceed with CPAP machine. However, he wants to know if he should have oxygen at night with the CPAP machine as well.   Patient was also following up on order for POC. Patient was walked on RA on 5/17 and only dropped to 88%. He stated that he was advised that he would need 2L. I do not see this in his chart.   RA, please advise on the O2. Thanks!

## 2017-10-31 NOTE — Telephone Encounter (Signed)
2L o2

## 2017-10-31 NOTE — Telephone Encounter (Signed)
Can blend in 2L O2 into CPAP until we check ONO in the future

## 2017-10-31 NOTE — Telephone Encounter (Signed)
ATC pt, no answer. Left message for pt to call back.  Order pended for CPAP. I want to see if he has a DME company that he wants to use.   Pt called back and he wants to use [facility] for his DME. He also wants a POC, Dr. Elsworth Soho he only walked one lap and the liter flow wasn't specified. Please clarify his liter dose for the oxygen during the day.

## 2017-11-03 NOTE — Telephone Encounter (Signed)
Was on the phone explaining to him the order for the CPAP with O2 and POC when phone call was dropped. ATC patient back but it went to straight to VM.   Will wait to hear back from patient to make sure I answered all of his questions before placing order.

## 2017-11-03 NOTE — Telephone Encounter (Signed)
Pt is returning call. Cb is [phone].

## 2017-11-03 NOTE — Telephone Encounter (Signed)
Spoke with patient. He wishes to proceed with O2 and CPAP orders. Will go ahead and place these. Will follow up withpatient in 2 weeks to schedule his ROV.   Nothing else needed at time of call.

## 2017-11-03 NOTE — Telephone Encounter (Signed)
Patient returned call, CB [phone].

## 2017-11-07 ENCOUNTER — Telehealth: Payer: Self-pay | Admitting: Pulmonary Disease

## 2017-11-07 NOTE — Telephone Encounter (Signed)
LMTCB for [employer]. Jonelle Sidle is looking into this as she put in results.

## 2017-11-07 NOTE — Telephone Encounter (Signed)
Patient Care Coordination note fixed. Called Corene Cornea to let him know and nothing further is needed.

## 2017-11-07 NOTE — Telephone Encounter (Signed)
Corene Cornea is calling back from [facility]. CB is [phone] ext [phone]

## 2017-11-20 ENCOUNTER — Other Ambulatory Visit: Payer: Self-pay | Admitting: Cardiology

## 2017-11-29 ENCOUNTER — Telehealth: Payer: Self-pay | Admitting: Pulmonary Disease

## 2017-11-29 NOTE — Telephone Encounter (Signed)
Pt is calling back [phone]

## 2017-11-29 NOTE — Telephone Encounter (Signed)
Called to get patient scheduled with RA for a f/u on his CPAP. Spoke with [name] (pt's wife). She stated that the patient was away at the moment and she would have him to call back to schedule his appt.

## 2017-11-29 NOTE — Telephone Encounter (Signed)
Patient has been scheduled for 01/09/18. Will close this encounter.

## 2017-12-01 ENCOUNTER — Other Ambulatory Visit: Payer: Self-pay | Admitting: Cardiology

## 2017-12-01 DIAGNOSIS — I4819 Other persistent atrial fibrillation: Secondary | ICD-10-CM

## 2018-01-06 ENCOUNTER — Other Ambulatory Visit: Payer: Self-pay | Admitting: Cardiology

## 2018-01-08 ENCOUNTER — Other Ambulatory Visit: Payer: Self-pay | Admitting: Physician Assistant

## 2018-01-09 ENCOUNTER — Telehealth: Payer: Self-pay | Admitting: Physician Assistant

## 2018-01-09 ENCOUNTER — Ambulatory Visit (INDEPENDENT_AMBULATORY_CARE_PROVIDER_SITE_OTHER): Payer: Medicare Other | Admitting: Pulmonary Disease

## 2018-01-09 ENCOUNTER — Encounter: Payer: Self-pay | Admitting: Pulmonary Disease

## 2018-01-09 VITALS — BP 120/72 | HR 81 | Ht 71.0 in | Wt 238.6 lb

## 2018-01-09 DIAGNOSIS — R600 Localized edema: Secondary | ICD-10-CM | POA: Diagnosis not present

## 2018-01-09 DIAGNOSIS — J9611 Chronic respiratory failure with hypoxia: Secondary | ICD-10-CM

## 2018-01-09 DIAGNOSIS — G4733 Obstructive sleep apnea (adult) (pediatric): Secondary | ICD-10-CM | POA: Diagnosis not present

## 2018-01-09 DIAGNOSIS — J849 Interstitial pulmonary disease, unspecified: Secondary | ICD-10-CM | POA: Diagnosis not present

## 2018-01-09 DIAGNOSIS — I2510 Atherosclerotic heart disease of native coronary artery without angina pectoris: Secondary | ICD-10-CM

## 2018-01-09 NOTE — Assessment & Plan Note (Signed)
He has never used Lasix even though he has a prescription. I asked him to start 20 mg and call us back if this does not work so that we can increase dose to 40 mg. He is concerned since he has to use diapers anyways.  I explained that diuresing about 5 to 10 pounds would certainly improve his shortness of breath

## 2018-01-09 NOTE — Patient Instructions (Addendum)
Try to use  CPAP machine at least 4 hours consistently every night.  Start Lasix 20 mg daily and call back in 2 to 3 days if you do not make enough urine at dose.   Schedule PFTs in 2 weeks  Copy of note to [name] and Dr. Alroy Dust from [place]

## 2018-01-09 NOTE — Telephone Encounter (Signed)
[phone]    Jerry [name] needs pt last Cr and weight also QTC. He needs this to refill his Dofetilide.

## 2018-01-09 NOTE — Assessment & Plan Note (Signed)
I have asked him to try and improve his compliance to at least 4 hours every night We discussed CPAP download to ensure that CPAP is benefiting him

## 2018-01-09 NOTE — Progress Notes (Signed)
   Subjective:    Patient ID: Jerry [name], male    DOB: [birth year], 84 y.o.   MRN: [id number]  HPI  84 year old obese never smoker   for FU of ILD & OSA He is accompanied by his  wife [name].  He sees [name]  for coronary artery disease and atrial fibrillation. He does have chronic pedal edema. Atrial fibrillation is controlled with Tikosyn, his stent placement was in 2011   On his last office visit, he was found to desaturate and we set him up with oxygen.  He now has an oxygen concentrator in his upstairs bedroom, and another tank downstairs and when he goes out he uses his portable emergent concentrator which he obtained on his own.  His wife feels that his dyspnea is worse and is huffing and puffing even on minimal exertion and takes him a while to regain his breath. His chronic bipedal edema appears worse.  He denies cough or recent URIs. Based on titration study be obtained in a CPAP machine which he has been using.  He reports mild improvement in his daytime somnolence. CPAP download was reviewed which shows excellent control of events with average pressure of 11 cm on auto settings.  Compliance could be better at 3.5 hours on average  His wife has several questions and concerns   Significant tests/ events reviewed  Echo 2017 shows normal LVEF, RVSP 32  HRCT chest 09/2017 >> Mild subpleural reticulation and ground-glass, with a mild basilar predominance ? UIP Serology neg  HST 09/2017 mod OSA >> 22/h titration study. He did not sleep well > auto CPAP  Review of Systems neg for any significant sore throat, dysphagia, itching, sneezing, nasal congestion or excess/ purulent secretions, fever, chills, sweats, unintended wt loss, pleuritic or exertional cp, hempoptysis, orthopnea pnd or change in chronic leg swelling. Also denies presyncope, palpitations, heartburn, abdominal pain, nausea, vomiting, diarrhea or change in bowel or urinary habits, dysuria,hematuria, rash,  arthralgias, visual complaints, headache, numbness weakness or ataxia.     Objective:   Physical Exam   Gen. Pleasant, obese, in no distress ENT - no lesions, no post nasal drip Neck: No JVD, no thyromegaly, no carotid bruits Lungs: no use of accessory muscles, no dullness to percussion,bibasal fine  rales no rhonchi  Cardiovascular: Rhythm regular, heart sounds  normal, no murmurs or gallops, 2+ peripheral edema Musculoskeletal: No deformities, no cyanosis or clubbing , no tremors        Assessment & Plan:

## 2018-01-09 NOTE — Assessment & Plan Note (Signed)
I advised him on oxygen use during ambulation and sleep.  He can possibly stay off oxygen while at rest. He would benefit from pulmonary rehab but unfortunately he is limited and does not want to participate at this time

## 2018-01-09 NOTE — Telephone Encounter (Signed)
Returned call to pharmacist.  Advised last Cr 1.04, weight 231 lbs. And per last ov note  EKG:  Done today and reviewed with Dr. Lovena Le today: SR 81bpm, PACs, PR 120ms, QRS 53ms, we measured QT, 400-422ms, corrected to 465-484ms   Pharmacist indicates understanding.  Will refill dofetilide.

## 2018-01-09 NOTE — Assessment & Plan Note (Signed)
We discussed anti-fibrotic therapy.  Given side effects he would rather avoid this. We will schedule PFTs. Serologies so far negative and I do favor IPF here

## 2018-01-24 ENCOUNTER — Ambulatory Visit (INDEPENDENT_AMBULATORY_CARE_PROVIDER_SITE_OTHER): Payer: Medicare Other | Admitting: Pulmonary Disease

## 2018-01-24 ENCOUNTER — Encounter: Payer: Self-pay | Admitting: Pulmonary Disease

## 2018-01-24 DIAGNOSIS — R5381 Other malaise: Secondary | ICD-10-CM | POA: Diagnosis not present

## 2018-01-24 DIAGNOSIS — G4733 Obstructive sleep apnea (adult) (pediatric): Secondary | ICD-10-CM

## 2018-01-24 DIAGNOSIS — J849 Interstitial pulmonary disease, unspecified: Secondary | ICD-10-CM

## 2018-01-24 DIAGNOSIS — I2510 Atherosclerotic heart disease of native coronary artery without angina pectoris: Secondary | ICD-10-CM | POA: Diagnosis not present

## 2018-01-24 DIAGNOSIS — R600 Localized edema: Secondary | ICD-10-CM

## 2018-01-24 LAB — PULMONARY FUNCTION TEST
DL/VA % PRED: 74 %
DL/VA: 3.47 ml/min/mmHg/L
DLCO UNC: 14.26 ml/min/mmHg
DLCO unc % pred: 42 %
FEF 25-75 POST: 2.74 L/s
FEF 25-75 Pre: 1.9 L/sec
FEF2575-%Change-Post: 44 %
FEF2575-%Pred-Post: 154 %
FEF2575-%Pred-Pre: 107 %
FEV1-%CHANGE-POST: 8 %
FEV1-%PRED-PRE: 76 %
FEV1-%Pred-Post: 82 %
FEV1-Post: 2.27 L
FEV1-Pre: 2.09 L
FEV1FVC-%Change-Post: 6 %
FEV1FVC-%PRED-PRE: 111 %
FEV6-%Change-Post: 4 %
FEV6-%PRED-POST: 74 %
FEV6-%Pred-Pre: 71 %
FEV6-POST: 2.72 L
FEV6-Pre: 2.61 L
FEV6FVC-%CHANGE-POST: 1 %
FEV6FVC-%PRED-POST: 107 %
FEV6FVC-%Pred-Pre: 105 %
FVC-%Change-Post: 1 %
FVC-%PRED-POST: 69 %
FVC-%Pred-Pre: 67 %
FVC-Post: 2.72 L
FVC-Pre: 2.67 L
PRE FEV6/FVC RATIO: 99 %
Post FEV1/FVC ratio: 84 %
Post FEV6/FVC ratio: 100 %
Pre FEV1/FVC ratio: 78 %

## 2018-01-24 NOTE — Assessment & Plan Note (Signed)
We will try to get you home physical therapy  Hope is that we can get him more ambulatory and eventually enrolled on the pulmonary rehab. I recommended him not to drive until his general condition improves

## 2018-01-24 NOTE — Addendum Note (Signed)
Addended by: Valerie Salts on: 01/24/2018 03:33 PM   Modules accepted: Orders

## 2018-01-24 NOTE — Progress Notes (Signed)
PFT completed today.Jerry [name],CMA  

## 2018-01-24 NOTE — Patient Instructions (Addendum)
increase Lasix to 40 mg daily -Call me in 2 weeks to report how you are doing with this increased dose in terms of breathing, leg swelling and energy levels.  We will try to get you home physical therapy  You probably have idiopathic pulmonary fibrosis. We discussed prognosis of this condition and treatment options  Continue using  CPAP machine at night

## 2018-01-24 NOTE — Assessment & Plan Note (Signed)
Weight loss encouraged, compliance with goal of at least 4-6 hrs every night is the expectation. Advised against medications with sedative side effects Cautioned against driving when sleepy - understanding that sleepiness will vary on a day to day basis  

## 2018-01-24 NOTE — Assessment & Plan Note (Signed)
increase Lasix to 40 mg daily -Call me in 2 weeks to report how you are doing with this increased dose in terms of breathing, leg swelling and energy levels.

## 2018-01-24 NOTE — Assessment & Plan Note (Signed)
-  probably have idiopathic pulmonary fibrosis. We discussed prognosis of this condition and treatment options  .  Again agreed that he would not be a good candidate for medications, his quality of life would be much worse should he develop side effects

## 2018-01-24 NOTE — Progress Notes (Signed)
   Subjective:    Patient ID: Jerry [name], male    DOB: January 22, 1930, 84 y.o.   MRN: [id number]  HPI  84 year old obese never smoker for FUof ILD & OSA He is accompanied by his wife Jerry [name]. He seesDr. Ellyn Hack for coronary artery disease and atrial fibrillation. He does have chronic pedal edema. Atrial fibrillation is controlled with Tikosyn, s/p stent 2011   Over the last few months we have gotten him portable oxygen and nocturnal CPAP.  However his tiredness and lack of energy persists.  His breathing is improved somewhat especially with oxygen use.  But his deconditioning still seems to be worse, wife Jerry [name] is concerned that he can hardly take a few steps, does not like to use his walker and she is to carry his oxygen.  PFTs were reviewed today, shows moderate intraparenchymal restriction, with an FVC of 69%, normal ratio, TLC of 63%, DLCO 42% but corrects for alveolar volume.  He has been more compliant with 20 mg of Lasix but pedal edema seems to persist. He has been more compliant with his CPAP able to use it up to 5-1/2 hours every night   Significant tests/ events reviewed  Echo 2017 shows normal LVEF, RVSP 32  HRCT chest 09/2017 >>Mild subpleural reticulation and ground-glass, with a mild basilar predominance? UIP Serology neg  HST 09/2017 mod OSA >> 22/h titration study. He did not sleep well > auto CPAP   Past Medical History:  Diagnosis Date  . Anxiety   . CAD S/P percutaneous coronary angioplasty 1996, 2009, 01/2010   PTCA of L Cx 1996; BMS-RCA [facility]) 2009; Staged PCI RCA (70% ISR) --> 3.0 mm x 23 mm BMS, staged PCI Diag - 2.0 mm x 12 mm Mini Vision BMS (2.25 mm)  . Cataracts, bilateral    immature  . Chronic back pain    scoliosis--pt states unable to have surgery bc of age  . GERD (gastroesophageal reflux disease)    takes Nexium daily  . History of colon polyps   . Hyperlipidemia   . Hypertension   . Myocardial infarction [facility]) 1996, 2009   x 2;last time was about 8-78yrs ago   . Obesity (BMI 30.0-34.9)   . OSA (obstructive sleep apnea)   . Osteoarthritis of both knees   . Peripheral edema   . Persistent atrial fibrillation ([name])   . Prostate cancer ([name]) 2005   seed implant     Review of Systems neg for any significant sore throat, dysphagia, itching, sneezing, nasal congestion or excess/ purulent secretions, fever, chills, sweats, unintended wt loss, pleuritic or exertional cp, hempoptysis, orthopnea pnd or change in chronic leg swelling.   Also denies presyncope, palpitations, heartburn, abdominal pain, nausea, vomiting, diarrhea or change in bowel or urinary habits, dysuria,hematuria, rash, arthralgias, visual complaints, headache, numbness weakness or ataxia.     Objective:   Physical Exam  Gen. Pleasant, obese, in no distress ENT -  Class 2 airway, no pallor, icterus Neck: No JVD, no thyromegaly, no carotid bruits Lungs: no use of accessory muscles, no dullness to percussion, bibsal 1/3 rales no rhonchi  Cardiovascular: Rhythm regular, heart sounds  normal, no murmurs or gallops, 2+ peripheral edema Musculoskeletal: No deformities, no cyanosis or clubbing , no tremors       Assessment & Plan:

## 2018-01-31 DIAGNOSIS — I10 Essential (primary) hypertension: Secondary | ICD-10-CM | POA: Diagnosis not present

## 2018-01-31 DIAGNOSIS — K219 Gastro-esophageal reflux disease without esophagitis: Secondary | ICD-10-CM | POA: Diagnosis not present

## 2018-01-31 DIAGNOSIS — Z8546 Personal history of malignant neoplasm of prostate: Secondary | ICD-10-CM | POA: Diagnosis not present

## 2018-01-31 DIAGNOSIS — Z7901 Long term (current) use of anticoagulants: Secondary | ICD-10-CM | POA: Diagnosis not present

## 2018-01-31 DIAGNOSIS — M170 Bilateral primary osteoarthritis of knee: Secondary | ICD-10-CM | POA: Diagnosis not present

## 2018-01-31 DIAGNOSIS — J849 Interstitial pulmonary disease, unspecified: Secondary | ICD-10-CM | POA: Diagnosis not present

## 2018-01-31 DIAGNOSIS — I2510 Atherosclerotic heart disease of native coronary artery without angina pectoris: Secondary | ICD-10-CM | POA: Diagnosis not present

## 2018-01-31 DIAGNOSIS — J9611 Chronic respiratory failure with hypoxia: Secondary | ICD-10-CM | POA: Diagnosis not present

## 2018-01-31 DIAGNOSIS — E669 Obesity, unspecified: Secondary | ICD-10-CM | POA: Diagnosis not present

## 2018-01-31 DIAGNOSIS — I481 Persistent atrial fibrillation: Secondary | ICD-10-CM | POA: Diagnosis not present

## 2018-01-31 DIAGNOSIS — G4733 Obstructive sleep apnea (adult) (pediatric): Secondary | ICD-10-CM | POA: Diagnosis not present

## 2018-01-31 DIAGNOSIS — Z87891 Personal history of nicotine dependence: Secondary | ICD-10-CM | POA: Diagnosis not present

## 2018-01-31 DIAGNOSIS — F419 Anxiety disorder, unspecified: Secondary | ICD-10-CM | POA: Diagnosis not present

## 2018-02-02 DIAGNOSIS — J9611 Chronic respiratory failure with hypoxia: Secondary | ICD-10-CM | POA: Diagnosis not present

## 2018-02-02 DIAGNOSIS — I2510 Atherosclerotic heart disease of native coronary artery without angina pectoris: Secondary | ICD-10-CM | POA: Diagnosis not present

## 2018-02-02 DIAGNOSIS — M170 Bilateral primary osteoarthritis of knee: Secondary | ICD-10-CM | POA: Diagnosis not present

## 2018-02-02 DIAGNOSIS — I481 Persistent atrial fibrillation: Secondary | ICD-10-CM | POA: Diagnosis not present

## 2018-02-02 DIAGNOSIS — G4733 Obstructive sleep apnea (adult) (pediatric): Secondary | ICD-10-CM | POA: Diagnosis not present

## 2018-02-02 DIAGNOSIS — J849 Interstitial pulmonary disease, unspecified: Secondary | ICD-10-CM | POA: Diagnosis not present

## 2018-02-06 DIAGNOSIS — G4733 Obstructive sleep apnea (adult) (pediatric): Secondary | ICD-10-CM | POA: Diagnosis not present

## 2018-02-06 DIAGNOSIS — J849 Interstitial pulmonary disease, unspecified: Secondary | ICD-10-CM | POA: Diagnosis not present

## 2018-02-06 DIAGNOSIS — I2510 Atherosclerotic heart disease of native coronary artery without angina pectoris: Secondary | ICD-10-CM | POA: Diagnosis not present

## 2018-02-06 DIAGNOSIS — M170 Bilateral primary osteoarthritis of knee: Secondary | ICD-10-CM | POA: Diagnosis not present

## 2018-02-06 DIAGNOSIS — J9611 Chronic respiratory failure with hypoxia: Secondary | ICD-10-CM | POA: Diagnosis not present

## 2018-02-06 DIAGNOSIS — I481 Persistent atrial fibrillation: Secondary | ICD-10-CM | POA: Diagnosis not present

## 2018-02-09 DIAGNOSIS — M170 Bilateral primary osteoarthritis of knee: Secondary | ICD-10-CM | POA: Diagnosis not present

## 2018-02-09 DIAGNOSIS — I2510 Atherosclerotic heart disease of native coronary artery without angina pectoris: Secondary | ICD-10-CM | POA: Diagnosis not present

## 2018-02-09 DIAGNOSIS — I481 Persistent atrial fibrillation: Secondary | ICD-10-CM | POA: Diagnosis not present

## 2018-02-09 DIAGNOSIS — G4733 Obstructive sleep apnea (adult) (pediatric): Secondary | ICD-10-CM | POA: Diagnosis not present

## 2018-02-09 DIAGNOSIS — J849 Interstitial pulmonary disease, unspecified: Secondary | ICD-10-CM | POA: Diagnosis not present

## 2018-02-09 DIAGNOSIS — J9611 Chronic respiratory failure with hypoxia: Secondary | ICD-10-CM | POA: Diagnosis not present

## 2018-02-13 ENCOUNTER — Telehealth: Payer: Self-pay | Admitting: Pulmonary Disease

## 2018-02-13 DIAGNOSIS — I481 Persistent atrial fibrillation: Secondary | ICD-10-CM | POA: Diagnosis not present

## 2018-02-13 DIAGNOSIS — G4733 Obstructive sleep apnea (adult) (pediatric): Secondary | ICD-10-CM | POA: Diagnosis not present

## 2018-02-13 DIAGNOSIS — M170 Bilateral primary osteoarthritis of knee: Secondary | ICD-10-CM | POA: Diagnosis not present

## 2018-02-13 DIAGNOSIS — J849 Interstitial pulmonary disease, unspecified: Secondary | ICD-10-CM | POA: Diagnosis not present

## 2018-02-13 DIAGNOSIS — J9611 Chronic respiratory failure with hypoxia: Secondary | ICD-10-CM | POA: Diagnosis not present

## 2018-02-13 DIAGNOSIS — I2510 Atherosclerotic heart disease of native coronary artery without angina pectoris: Secondary | ICD-10-CM | POA: Diagnosis not present

## 2018-02-13 NOTE — Telephone Encounter (Signed)
ATC pt, no answer. Left message for pt to call back.  

## 2018-02-14 NOTE — Telephone Encounter (Signed)
If it is working well for him then continue lasix at increased dose  He has follow-up with Sherron Flemings, NP next week.   We can check metabolic panel when he is in clinic.

## 2018-02-14 NOTE — Telephone Encounter (Signed)
Patient called back in regards to Lasix. Patient doubled this and was to report back to RA about this. Patient stated that this is working fine and he is not swelling. Patient would like to know what to do about this. Should he continue the double dosage or go back to normal dose.   Due to RA being out of the office Dr. Vaughan Browner please advise on this thank you.

## 2018-02-14 NOTE — Telephone Encounter (Signed)
Called patient unable to reach left message to give us a call back.

## 2018-02-15 NOTE — Telephone Encounter (Signed)
Pt is calling back [phone]

## 2018-02-15 NOTE — Telephone Encounter (Signed)
Spoke with patient. He is aware of Dr. Matilde Bash response and his appt next week with [facility]. Verbalized understanding. Nothing else needed at time of call.

## 2018-02-16 DIAGNOSIS — I481 Persistent atrial fibrillation: Secondary | ICD-10-CM | POA: Diagnosis not present

## 2018-02-16 DIAGNOSIS — G4733 Obstructive sleep apnea (adult) (pediatric): Secondary | ICD-10-CM | POA: Diagnosis not present

## 2018-02-16 DIAGNOSIS — J9611 Chronic respiratory failure with hypoxia: Secondary | ICD-10-CM | POA: Diagnosis not present

## 2018-02-16 DIAGNOSIS — M170 Bilateral primary osteoarthritis of knee: Secondary | ICD-10-CM | POA: Diagnosis not present

## 2018-02-16 DIAGNOSIS — J849 Interstitial pulmonary disease, unspecified: Secondary | ICD-10-CM | POA: Diagnosis not present

## 2018-02-16 DIAGNOSIS — I2510 Atherosclerotic heart disease of native coronary artery without angina pectoris: Secondary | ICD-10-CM | POA: Diagnosis not present

## 2018-02-20 DIAGNOSIS — M170 Bilateral primary osteoarthritis of knee: Secondary | ICD-10-CM | POA: Diagnosis not present

## 2018-02-20 DIAGNOSIS — J9611 Chronic respiratory failure with hypoxia: Secondary | ICD-10-CM | POA: Diagnosis not present

## 2018-02-20 DIAGNOSIS — G4733 Obstructive sleep apnea (adult) (pediatric): Secondary | ICD-10-CM | POA: Diagnosis not present

## 2018-02-20 DIAGNOSIS — I481 Persistent atrial fibrillation: Secondary | ICD-10-CM | POA: Diagnosis not present

## 2018-02-20 DIAGNOSIS — J849 Interstitial pulmonary disease, unspecified: Secondary | ICD-10-CM | POA: Diagnosis not present

## 2018-02-20 DIAGNOSIS — I2510 Atherosclerotic heart disease of native coronary artery without angina pectoris: Secondary | ICD-10-CM | POA: Diagnosis not present

## 2018-02-21 ENCOUNTER — Other Ambulatory Visit (INDEPENDENT_AMBULATORY_CARE_PROVIDER_SITE_OTHER): Payer: Medicare Other

## 2018-02-21 ENCOUNTER — Encounter: Payer: Self-pay | Admitting: Primary Care

## 2018-02-21 ENCOUNTER — Ambulatory Visit (INDEPENDENT_AMBULATORY_CARE_PROVIDER_SITE_OTHER): Payer: Medicare Other | Admitting: Primary Care

## 2018-02-21 VITALS — BP 108/68 | HR 88 | Ht 70.0 in

## 2018-02-21 DIAGNOSIS — R5381 Other malaise: Secondary | ICD-10-CM | POA: Diagnosis not present

## 2018-02-21 DIAGNOSIS — J9611 Chronic respiratory failure with hypoxia: Secondary | ICD-10-CM

## 2018-02-21 DIAGNOSIS — J849 Interstitial pulmonary disease, unspecified: Secondary | ICD-10-CM

## 2018-02-21 DIAGNOSIS — G4733 Obstructive sleep apnea (adult) (pediatric): Secondary | ICD-10-CM

## 2018-02-21 DIAGNOSIS — R600 Localized edema: Secondary | ICD-10-CM | POA: Diagnosis not present

## 2018-02-21 LAB — BASIC METABOLIC PANEL
BUN: 25 mg/dL — ABNORMAL HIGH (ref 6–23)
CALCIUM: 9.1 mg/dL (ref 8.4–10.5)
CO2: 31 meq/L (ref 19–32)
CREATININE: 0.96 mg/dL (ref 0.40–1.50)
Chloride: 103 mEq/L (ref 96–112)
GFR: 78.93 mL/min (ref >60.00)
Glucose, Bld: 104 mg/dL — ABNORMAL HIGH (ref 70–99)
Potassium: 4.3 mEq/L (ref 3.5–5.1)
Sodium: 139 mEq/L (ref 135–145)

## 2018-02-21 NOTE — Progress Notes (Signed)
@Patient  ID: Jerry [name], male    DOB: 13-Apr-1930, 84 y.o.   MRN: [id number]  Chief Complaint  Patient presents with  . Follow-up    about the same    Referring provider: Alroy Dust, L.Marlou Sa, MD  HPI: 84 year old male, never smoked. PMH ILD, OSA (on cpap), CAD and afib (on Tikosyn).   Patient of Dr. Elsworth Soho, last seen on 01/24/18 for persistent fatigue, lack of energy and pedal edema. Breath improved with oxygen use but pt appeared more deconditioned during most recent visit. Lasix increased to 40mg  daily. Using CPAP, wearing 5 1/2 hours every night.   PFTs showed moderate intraparenchymal restriction, with an FVC of 69%, normal ratio, TLC of 63%, DLCO 42% but corrects for alveolar volume.  02/25/2018 Patient presents today for 1 month follow-up visit. Accompanied by his wife [name]. Lasix increased to 40mg  po daily x 2 weeks, pt feels diuretic has helped with swelling in his feet but still has some residual edema. Needs recheck BMET today. Using 2L oxygen during the day and night. Breathing is ok. No complaints of cough or wheezing. Some sob with exertion. Reports compliance with CPAP every night. Receiving physical therapy to the house twice a week. He feels it's been a positive experience and has been very helpful. States that they have been working on balance. Still not very motivated to do things himself, relies heavily on his wife for carry portable Oxygen canister and dishes at home. State that he likes to be taken care of and being dependent on others for a change. There is some tension between the couple today with frequently arguing about what he can and can not do. He does lack motivation and states multiple times that he doesn't want to do anything. Wife reports that he doesn't use walker at home, he has trouble getting in and out of the car.    Allergies  Allergen Reactions  . Crab [Shellfish Allergy] Rash  . Hydrocodone Hives  . Adhesive [Tape] Rash  . Oxycodone Rash     Immunization History  Administered Date(s) Administered  . H1N1 04/13/2010  . Influenza, High Dose Seasonal PF 02/12/2014  . Influenza-Unspecified 03/06/2013, 02/12/2014, 03/06/2017  . Pneumococcal Conjugate-13 02/12/2014  . Pneumococcal Polysaccharide-23 02/04/2013, 02/12/2014  . Td 09/04/2005  . Tdap 12/11/2012  . Zoster 07/07/2006    Past Medical History:  Diagnosis Date  . Anxiety   . CAD S/P percutaneous coronary angioplasty 1996, 2009, 01/2010   PTCA of L Cx 1996; BMS-RCA [facility]) 2009; Staged PCI RCA (70% ISR) --> 3.0 mm x 23 mm BMS, staged PCI Diag - 2.0 mm x 12 mm Mini Vision BMS (2.25 mm)  . Cataracts, bilateral    immature  . Chronic back pain    scoliosis--pt states unable to have surgery bc of age  . GERD (gastroesophageal reflux disease)    takes Nexium daily  . History of colon polyps   . Hyperlipidemia   . Hypertension   . Myocardial infarction [facility]) 1996, 2009   x 2;last time was about 8-78yrs ago   . Obesity (BMI 30.0-34.9)   . OSA (obstructive sleep apnea)   . Osteoarthritis of both knees   . Peripheral edema   . Persistent atrial fibrillation ([place])   . Prostate cancer ([place]) 2005   seed implant    Tobacco History: Social History   Tobacco Use  Smoking Status Former Smoker  . Types: Pipe  . Last attempt to quit: 03/12/1984  . Years since  quitting: 33.9  Smokeless Tobacco Never Used  Tobacco Comment   quit smoking pipe about 35yrs ago   Counseling given: Not Answered Comment: quit smoking pipe about 34yrs ago   Outpatient Medications Prior to Visit  Medication Sig Dispense Refill  . acetaminophen (TYLENOL) 500 MG tablet Take 1,000 mg by mouth daily as needed (pain).    [name] atorvastatin (LIPITOR) 40 MG tablet TAKE 1 TABLET DAILY AT BEDTIME 90 tablet 1  . CARTIA XT 120 MG 24 hr capsule TAKE 1 CAPSULE DAILY 90 capsule 0  . Cholecalciferol (VITAMIN D3) 1000 units CAPS Take 1 capsule by mouth daily.     . diazepam (VALIUM) 2 MG tablet Take 2 mg  by mouth daily as needed for anxiety.     . dofetilide (TIKOSYN) 500 MCG capsule TAKE 1 CAPSULE TWICE A DAY 180 capsule 1  . ELIQUIS 5 MG TABS tablet TAKE 1 TABLET TWICE A DAY 180 tablet 0  . esomeprazole (NEXIUM) 40 MG capsule TAKE 1 CAPSULE DAILY (Patient taking differently: TAKE 40 mg CAPSULE DAILY) 90 capsule 3  . furosemide (LASIX) 20 MG tablet TAKE 1 TABLET DAILY AS NEEDED (Patient taking differently: 40 mg daily. ) 90 tablet 2  . hydrocortisone cream 0.5 % Apply 1 application topically daily. Use as directed    . losartan (COZAAR) 100 MG tablet TAKE 100 mg  TABLET DAILY 90 tablet 2  . magnesium hydroxide (MILK OF MAGNESIA) 400 MG/5ML suspension Take 30 mLs by mouth daily as needed for mild constipation.    . magnesium oxide (MAG-OX) 400 MG tablet Take 1 tablet (400 mg total) by mouth 2 (two) times daily. 60 tablet 6  . mirabegron ER (MYRBETRIQ) 25 MG TB24 tablet Take 25 mg by mouth daily.     [name] NITROSTAT 0.4 MG SL tablet USE AS NEEDED 25 tablet 3  . Polyethylene Glycol 3350 (MIRALAX PO) Take 17 g by mouth daily as needed (Constipation).     . potassium chloride SA (K-DUR,KLOR-CON) 20 MEQ tablet TAKE 1 TABLET DAILY 90 tablet 2  . tamsulosin (FLOMAX) 0.4 MG CAPS capsule Take 0.4 mg by mouth at bedtime.      No facility-administered medications prior to visit.     Review of Systems  Review of Systems  Constitutional: Positive for activity change and fatigue. Negative for appetite change, chills, diaphoresis and fever.  HENT: Negative.   Respiratory: Negative for cough, shortness of breath and wheezing.        Dyspnea exertion   Cardiovascular: Negative.     Physical Exam  BP 108/68 (BP Location: Left Arm, Cuff Size: Normal)   Pulse 88   Ht 5\' 10"  (1.778 m)   SpO2 97%   BMI 33.86 kg/m  Physical Exam  Constitutional: He is oriented to person, place, and time. He appears well-developed and well-nourished. No distress.  HENT:  Head: Normocephalic and atraumatic.  Eyes: Pupils  are equal, round, and reactive to light. EOM are normal.  Neck: Normal range of motion. Neck supple.  Cardiovascular: Normal rate and regular rhythm.  Trace pedal edema   Pulmonary/Chest: Effort normal. No stridor. No respiratory distress. He has no wheezes. He exhibits no tenderness.  Mostly clear, few scattered crackles to right lung base   Musculoskeletal:  In [facility]  Neurological: He is alert and oriented to person, place, and time.  Skin: Skin is warm and dry.  Psychiatric: His behavior is normal. Judgment normal.  Flat affect     Lab Results:  CBC  Component Value Date/Time   WBC 10.5 06/13/2017 2107   RBC 4.39 06/13/2017 2107   HGB 14.0 06/13/2017 2107   HGB 14.8 12/05/2016 1208   HCT 43.4 06/13/2017 2107   HCT 42.8 12/05/2016 1208   PLT 217 06/13/2017 2107   PLT 211 12/05/2016 1208   MCV 98.9 06/13/2017 2107   MCV 96 12/05/2016 1208   MCH 31.9 06/13/2017 2107   MCHC 32.3 06/13/2017 2107   RDW 13.0 06/13/2017 2107   RDW 13.9 12/05/2016 1208   LYMPHSABS 1,818 02/24/2016 1233   MONOABS 707 02/24/2016 1233   EOSABS 404 02/24/2016 1233   BASOSABS 101 02/24/2016 1233    BMET    Component Value Date/Time   NA 139 02/21/2018 1541   NA 140 12/05/2016 1208   K 4.3 02/21/2018 1541   CL 103 02/21/2018 1541   CO2 31 02/21/2018 1541   GLUCOSE 104 (H) 02/21/2018 1541   BUN 25 (H) 02/21/2018 1541   BUN 15 12/05/2016 1208   CREATININE 0.96 02/21/2018 1541   CREATININE 1.14 (H) 04/07/2016 1355   CALCIUM 9.1 02/21/2018 1541   GFRNONAA >60 06/13/2017 2107   GFRAA >60 06/13/2017 2107    BNP    Component Value Date/Time   BNP 70.7 05/27/2016 2240   BNP 261.1 (H) 06/09/2015 1628    ProBNP No results found for: PROBNP  Imaging: No results found.   Assessment & Plan:   Chronic respiratory failure with hypoxia (HCC) - Using 2L home oxygen at all times (day and night) - He is able to go to restaurants and doing more things outside the house since getting portable  oxygen  - Wife carrying POC, encouraged patient to try carrying with strap on should himself when out   Pedal edema - Improved lower leg swelling with increased lasix dose  - BMET obtained today, normal - Continue lasix 40mg  daily   Physical deconditioning - Receiving home physical therapy twice a week - Patient reports benefit from therapy and I would recommend continuing for another 4 weeks  - He does [place] motivation and needs to do start doing some small tasks/ADLS at home himself  ILD (interstitial lung disease) ([place]) - Felt to have idiopathic pulmonary fibrosis by Dr. Elsworth Soho, agreed not to pursue treatment with medication d/t potential SE   OSA (obstructive sleep apnea) - Good compliance with CPAP, wearing 89/90 days (61% >4 hours) - Pressure set at 8-15cm H20 - AHI 2.5  Encourage 100% compliance, goal >4-6 + hours every night No driving    FU in 6-8 weeks, continue to monitor leg swelling and deconditioning   Martyn Ehrich, NP 02/25/2018

## 2018-02-21 NOTE — Patient Instructions (Addendum)
Use rolling walker while at home and while out in the community Please have someone assist you in transporting walker  I encourage you to keep up with your activities of daily living Set small goals  Try carrying portable oxygen canister your self Empower yourself to do most things yourself, ask for assistant when needed  "IF YOU DONT USE IT, YOU LOSE IT!"  I recommend you continue physical therapy for another 4 weeks    Labs today Continues lasix 40mg  daily until you hear from me regarding blood work    FU with Dr. Elsworth Soho in 6-8 weeks

## 2018-02-23 DIAGNOSIS — G4733 Obstructive sleep apnea (adult) (pediatric): Secondary | ICD-10-CM | POA: Diagnosis not present

## 2018-02-23 DIAGNOSIS — I2510 Atherosclerotic heart disease of native coronary artery without angina pectoris: Secondary | ICD-10-CM | POA: Diagnosis not present

## 2018-02-23 DIAGNOSIS — M170 Bilateral primary osteoarthritis of knee: Secondary | ICD-10-CM | POA: Diagnosis not present

## 2018-02-23 DIAGNOSIS — J849 Interstitial pulmonary disease, unspecified: Secondary | ICD-10-CM | POA: Diagnosis not present

## 2018-02-23 DIAGNOSIS — I481 Persistent atrial fibrillation: Secondary | ICD-10-CM | POA: Diagnosis not present

## 2018-02-23 DIAGNOSIS — J9611 Chronic respiratory failure with hypoxia: Secondary | ICD-10-CM | POA: Diagnosis not present

## 2018-02-25 ENCOUNTER — Encounter: Payer: Self-pay | Admitting: Primary Care

## 2018-02-25 NOTE — Assessment & Plan Note (Signed)
-   Good compliance with CPAP, wearing 89/90 days (61% >4 hours) - Pressure set at 8-15cm H20 - AHI 2.5  Encourage 100% compliance, goal >4-6 + hours every night No driving

## 2018-02-25 NOTE — Assessment & Plan Note (Signed)
-   Improved lower leg swelling with increased lasix dose  - BMET obtained today, normal - Continue lasix 40mg  daily

## 2018-02-25 NOTE — Assessment & Plan Note (Signed)
-   Receiving home physical therapy twice a week - Patient reports benefit from therapy and I would recommend continuing for another 4 weeks  - He does lake motivation and needs to do start doing some small tasks/ADLS at home himself

## 2018-02-25 NOTE — Assessment & Plan Note (Signed)
-   Using 2L home oxygen at all times (day and night) - He is able to go to restaurants and doing more things outside the house since getting portable oxygen  - Wife carrying POC, encouraged patient to try carrying with strap on should himself when out

## 2018-02-25 NOTE — Assessment & Plan Note (Signed)
-   Felt to have idiopathic pulmonary fibrosis by Dr. Elsworth Soho, agreed not to pursue treatment with medication d/t potential SE

## 2018-02-27 DIAGNOSIS — I2510 Atherosclerotic heart disease of native coronary artery without angina pectoris: Secondary | ICD-10-CM | POA: Diagnosis not present

## 2018-02-27 DIAGNOSIS — J849 Interstitial pulmonary disease, unspecified: Secondary | ICD-10-CM | POA: Diagnosis not present

## 2018-02-27 DIAGNOSIS — G4733 Obstructive sleep apnea (adult) (pediatric): Secondary | ICD-10-CM | POA: Diagnosis not present

## 2018-02-27 DIAGNOSIS — M170 Bilateral primary osteoarthritis of knee: Secondary | ICD-10-CM | POA: Diagnosis not present

## 2018-02-27 DIAGNOSIS — J9611 Chronic respiratory failure with hypoxia: Secondary | ICD-10-CM | POA: Diagnosis not present

## 2018-02-27 DIAGNOSIS — I481 Persistent atrial fibrillation: Secondary | ICD-10-CM | POA: Diagnosis not present

## 2018-03-03 ENCOUNTER — Other Ambulatory Visit: Payer: Self-pay | Admitting: Cardiology

## 2018-03-03 DIAGNOSIS — I4819 Other persistent atrial fibrillation: Secondary | ICD-10-CM

## 2018-03-13 DIAGNOSIS — I2510 Atherosclerotic heart disease of native coronary artery without angina pectoris: Secondary | ICD-10-CM | POA: Diagnosis not present

## 2018-03-13 DIAGNOSIS — M170 Bilateral primary osteoarthritis of knee: Secondary | ICD-10-CM | POA: Diagnosis not present

## 2018-03-13 DIAGNOSIS — J9611 Chronic respiratory failure with hypoxia: Secondary | ICD-10-CM | POA: Diagnosis not present

## 2018-03-13 DIAGNOSIS — G4733 Obstructive sleep apnea (adult) (pediatric): Secondary | ICD-10-CM | POA: Diagnosis not present

## 2018-03-13 DIAGNOSIS — J849 Interstitial pulmonary disease, unspecified: Secondary | ICD-10-CM | POA: Diagnosis not present

## 2018-03-13 DIAGNOSIS — I481 Persistent atrial fibrillation: Secondary | ICD-10-CM | POA: Diagnosis not present

## 2018-03-20 DIAGNOSIS — I2510 Atherosclerotic heart disease of native coronary artery without angina pectoris: Secondary | ICD-10-CM | POA: Diagnosis not present

## 2018-03-20 DIAGNOSIS — J9611 Chronic respiratory failure with hypoxia: Secondary | ICD-10-CM | POA: Diagnosis not present

## 2018-03-20 DIAGNOSIS — I481 Persistent atrial fibrillation: Secondary | ICD-10-CM | POA: Diagnosis not present

## 2018-03-20 DIAGNOSIS — J849 Interstitial pulmonary disease, unspecified: Secondary | ICD-10-CM | POA: Diagnosis not present

## 2018-03-20 DIAGNOSIS — M170 Bilateral primary osteoarthritis of knee: Secondary | ICD-10-CM | POA: Diagnosis not present

## 2018-03-20 DIAGNOSIS — G4733 Obstructive sleep apnea (adult) (pediatric): Secondary | ICD-10-CM | POA: Diagnosis not present

## 2018-03-24 ENCOUNTER — Other Ambulatory Visit: Payer: Self-pay | Admitting: Cardiology

## 2018-03-26 DIAGNOSIS — I481 Persistent atrial fibrillation: Secondary | ICD-10-CM | POA: Diagnosis not present

## 2018-03-26 DIAGNOSIS — M170 Bilateral primary osteoarthritis of knee: Secondary | ICD-10-CM | POA: Diagnosis not present

## 2018-03-26 DIAGNOSIS — J9611 Chronic respiratory failure with hypoxia: Secondary | ICD-10-CM | POA: Diagnosis not present

## 2018-03-26 DIAGNOSIS — J849 Interstitial pulmonary disease, unspecified: Secondary | ICD-10-CM | POA: Diagnosis not present

## 2018-03-26 DIAGNOSIS — G4733 Obstructive sleep apnea (adult) (pediatric): Secondary | ICD-10-CM | POA: Diagnosis not present

## 2018-03-26 DIAGNOSIS — I2510 Atherosclerotic heart disease of native coronary artery without angina pectoris: Secondary | ICD-10-CM | POA: Diagnosis not present

## 2018-03-29 ENCOUNTER — Ambulatory Visit (INDEPENDENT_AMBULATORY_CARE_PROVIDER_SITE_OTHER): Payer: Medicare Other | Admitting: Pulmonary Disease

## 2018-03-29 ENCOUNTER — Encounter: Payer: Self-pay | Admitting: Pulmonary Disease

## 2018-03-29 VITALS — BP 132/86 | HR 86 | Ht 70.0 in | Wt 226.0 lb

## 2018-03-29 DIAGNOSIS — G4733 Obstructive sleep apnea (adult) (pediatric): Secondary | ICD-10-CM | POA: Diagnosis not present

## 2018-03-29 DIAGNOSIS — I2510 Atherosclerotic heart disease of native coronary artery without angina pectoris: Secondary | ICD-10-CM | POA: Diagnosis not present

## 2018-03-29 DIAGNOSIS — J9611 Chronic respiratory failure with hypoxia: Secondary | ICD-10-CM

## 2018-03-29 DIAGNOSIS — J849 Interstitial pulmonary disease, unspecified: Secondary | ICD-10-CM

## 2018-03-29 DIAGNOSIS — Z23 Encounter for immunization: Secondary | ICD-10-CM | POA: Diagnosis not present

## 2018-03-29 NOTE — Assessment & Plan Note (Signed)
He is compliant with his CPAP machine and this is certainly helping improve his daytime somnolence and fatigue  Weight loss encouraged, compliance with goal of at least 4-6 hrs every night is the expectation. Advised against medications with sedative side effects Cautioned against driving when sleepy - understanding that sleepiness will vary on a day to day basis

## 2018-03-29 NOTE — Assessment & Plan Note (Signed)
Does not appear to be progressive at present. He is not a candidate for anti-fibrotic therapies and is not interested due to side effects. Referral to pulmonary rehab-wife has issues driving him and he will look for assistance. They are planning to move into independent living in January and hopefully should be able to get a driver

## 2018-03-29 NOTE — Assessment & Plan Note (Signed)
He is compliant with his portable oxygen machine and has oxygen blended into CPAP machine

## 2018-03-29 NOTE — Progress Notes (Signed)
   Subjective:    Patient ID: Jerry [name], male    DOB: 04-Apr-1930, 84 y.o.   MRN: [id number]  HPI  84 yo obesenever smokerfor FUofILD & OSA -wife [name]. He seesDr. Ellyn Hack for coronary artery disease and atrial fibrillation. He does have chronic pedal edema. Atrial fibrillation is controlled with Tikosyn, s/p stent 2011   Over the last few months we have gotten him portable oxygen and nocturnal CPAP.  This was overwhelming at first but he is slowly settling down with the equipment. He feels that oxygen does help him to more.  He carries his portable machine everywhere and at home he has a machine on each floor.   He is more compliant with the CPAP and is using this up to 6 hours every night He feels at home physical therapy really helped him.  Lasix was increased up to 40 mg, and this did cause polyuria and weight may have dropped 10 pounds but this is only an estimate.  Breathing is marginally improved.  His wife [name] is usually has a lot of questions which we answered   Significant tests/ events reviewed  Echo 2017 shows normal LVEF, RVSP 32  HRCT chest 09/2017 >>Mild subpleural reticulation and ground-glass, with a mild basilar predominance? UIP Serology neg  PFTs 01/2018  moderate intraparenchymal restriction, with an FVC of 69%, normal ratio, TLC of 63%, DLCO 42% but corrects for alveolar volume.  HST 09/2017 mod OSA >> 22/h titration study. He did not sleep well> auto CPAP  Past Medical History:  Diagnosis Date  . Anxiety   . CAD S/P percutaneous coronary angioplasty 1996, 2009, 01/2010   PTCA of L Cx 1996; BMS-RCA [facility]) 2009; Staged PCI RCA (70% ISR) --> 3.0 mm x 23 mm BMS, staged PCI Diag - 2.0 mm x 12 mm Mini Vision BMS (2.25 mm)  . Cataracts, bilateral    immature  . Chronic back pain    scoliosis--pt states unable to have surgery bc of age  . GERD (gastroesophageal reflux disease)    takes Nexium daily  . History of colon polyps   .  Hyperlipidemia   . Hypertension   . Myocardial infarction [facility]) 1996, 2009   x 2;last time was about 8-61yrs ago   . Obesity (BMI 30.0-34.9)   . OSA (obstructive sleep apnea)   . Osteoarthritis of both knees   . Peripheral edema   . Persistent atrial fibrillation   . Prostate cancer ([place]) 2005   seed implant     Review of Systems neg for any significant sore throat, dysphagia, itching, sneezing, nasal congestion or excess/ purulent secretions, fever, chills, sweats, unintended wt loss, pleuritic or exertional cp, hempoptysis, orthopnea pnd or change in chronic leg swelling. Also denies presyncope, palpitations, heartburn, abdominal pain, nausea, vomiting, diarrhea or change in bowel or urinary habits, dysuria,hematuria, rash, arthralgias, visual complaints, headache, numbness weakness or ataxia.     Objective:   Physical Exam  Gen. Pleasant, obese, in no distress ENT - no lesions, no post nasal drip Neck: No JVD, no thyromegaly, no carotid bruits Lungs: no use of accessory muscles, no dullness to percussion, bibasal fine  rales no rhonchi  Cardiovascular: Rhythm regular, heart sounds  normal, no murmurs or gallops, 2+ peripheral edema Musculoskeletal: No deformities, no cyanosis or clubbing , no tremors        Assessment & Plan:

## 2018-03-29 NOTE — Patient Instructions (Addendum)
Stay on Lasix 20 mg daily.  Referral to pulmonary rehab Flu shot today

## 2018-04-03 ENCOUNTER — Telehealth (HOSPITAL_COMMUNITY): Payer: Self-pay

## 2018-04-03 NOTE — Telephone Encounter (Signed)
Called patient to see if he is interested in the Pulmonary Rehab Program. Patient stated not at this time.  Closed referral

## 2018-04-03 NOTE — Telephone Encounter (Signed)
Pt insurance is active and benefits verified through Medicare. Co-pay $0.00, DED $185.00/$185.00 met, out of pocket $0.00/$0.00 met, co-insurance 20%. No pre-authorization.   2ndary insurance is active and benefits verified through [facility]. Co-pay $0.00, DED $0.00/$0.00 met, out of pocket $0.00/$0.00 met, co-insurance 0%. No pre-authorization. 04/03/18 @ 4:11PM, SHP#[id number]   Will contact patient to see if he is interested in the Pulmonary Rehab Program.

## 2018-04-08 ENCOUNTER — Other Ambulatory Visit: Payer: Self-pay | Admitting: Cardiology

## 2018-04-17 DIAGNOSIS — K219 Gastro-esophageal reflux disease without esophagitis: Secondary | ICD-10-CM | POA: Diagnosis not present

## 2018-04-17 DIAGNOSIS — Z0000 Encounter for general adult medical examination without abnormal findings: Secondary | ICD-10-CM | POA: Diagnosis not present

## 2018-04-17 DIAGNOSIS — I10 Essential (primary) hypertension: Secondary | ICD-10-CM | POA: Diagnosis not present

## 2018-04-17 DIAGNOSIS — R2689 Other abnormalities of gait and mobility: Secondary | ICD-10-CM | POA: Diagnosis not present

## 2018-04-17 DIAGNOSIS — I4891 Unspecified atrial fibrillation: Secondary | ICD-10-CM | POA: Diagnosis not present

## 2018-04-17 DIAGNOSIS — Z111 Encounter for screening for respiratory tuberculosis: Secondary | ICD-10-CM | POA: Diagnosis not present

## 2018-04-17 DIAGNOSIS — E669 Obesity, unspecified: Secondary | ICD-10-CM | POA: Diagnosis not present

## 2018-04-30 DIAGNOSIS — R3915 Urgency of urination: Secondary | ICD-10-CM | POA: Diagnosis not present

## 2018-04-30 DIAGNOSIS — C61 Malignant neoplasm of prostate: Secondary | ICD-10-CM | POA: Diagnosis not present

## 2018-05-14 DIAGNOSIS — H40033 Anatomical narrow angle, bilateral: Secondary | ICD-10-CM | POA: Diagnosis not present

## 2018-05-14 DIAGNOSIS — Z961 Presence of intraocular lens: Secondary | ICD-10-CM | POA: Diagnosis not present

## 2018-05-14 DIAGNOSIS — H04123 Dry eye syndrome of bilateral lacrimal glands: Secondary | ICD-10-CM | POA: Diagnosis not present

## 2018-05-17 IMAGING — DX DG CHEST 2V
2 series · 2 of 2 positions shown · non-contrast
Comparison: 07/26/2017 and earlier.

CLINICAL DATA: 87-year-old female with increasing shortness of
breath and cough for 1 week.

EXAM:
CHEST - 2 VIEW

[dg chest 2 view (1 of 2)]
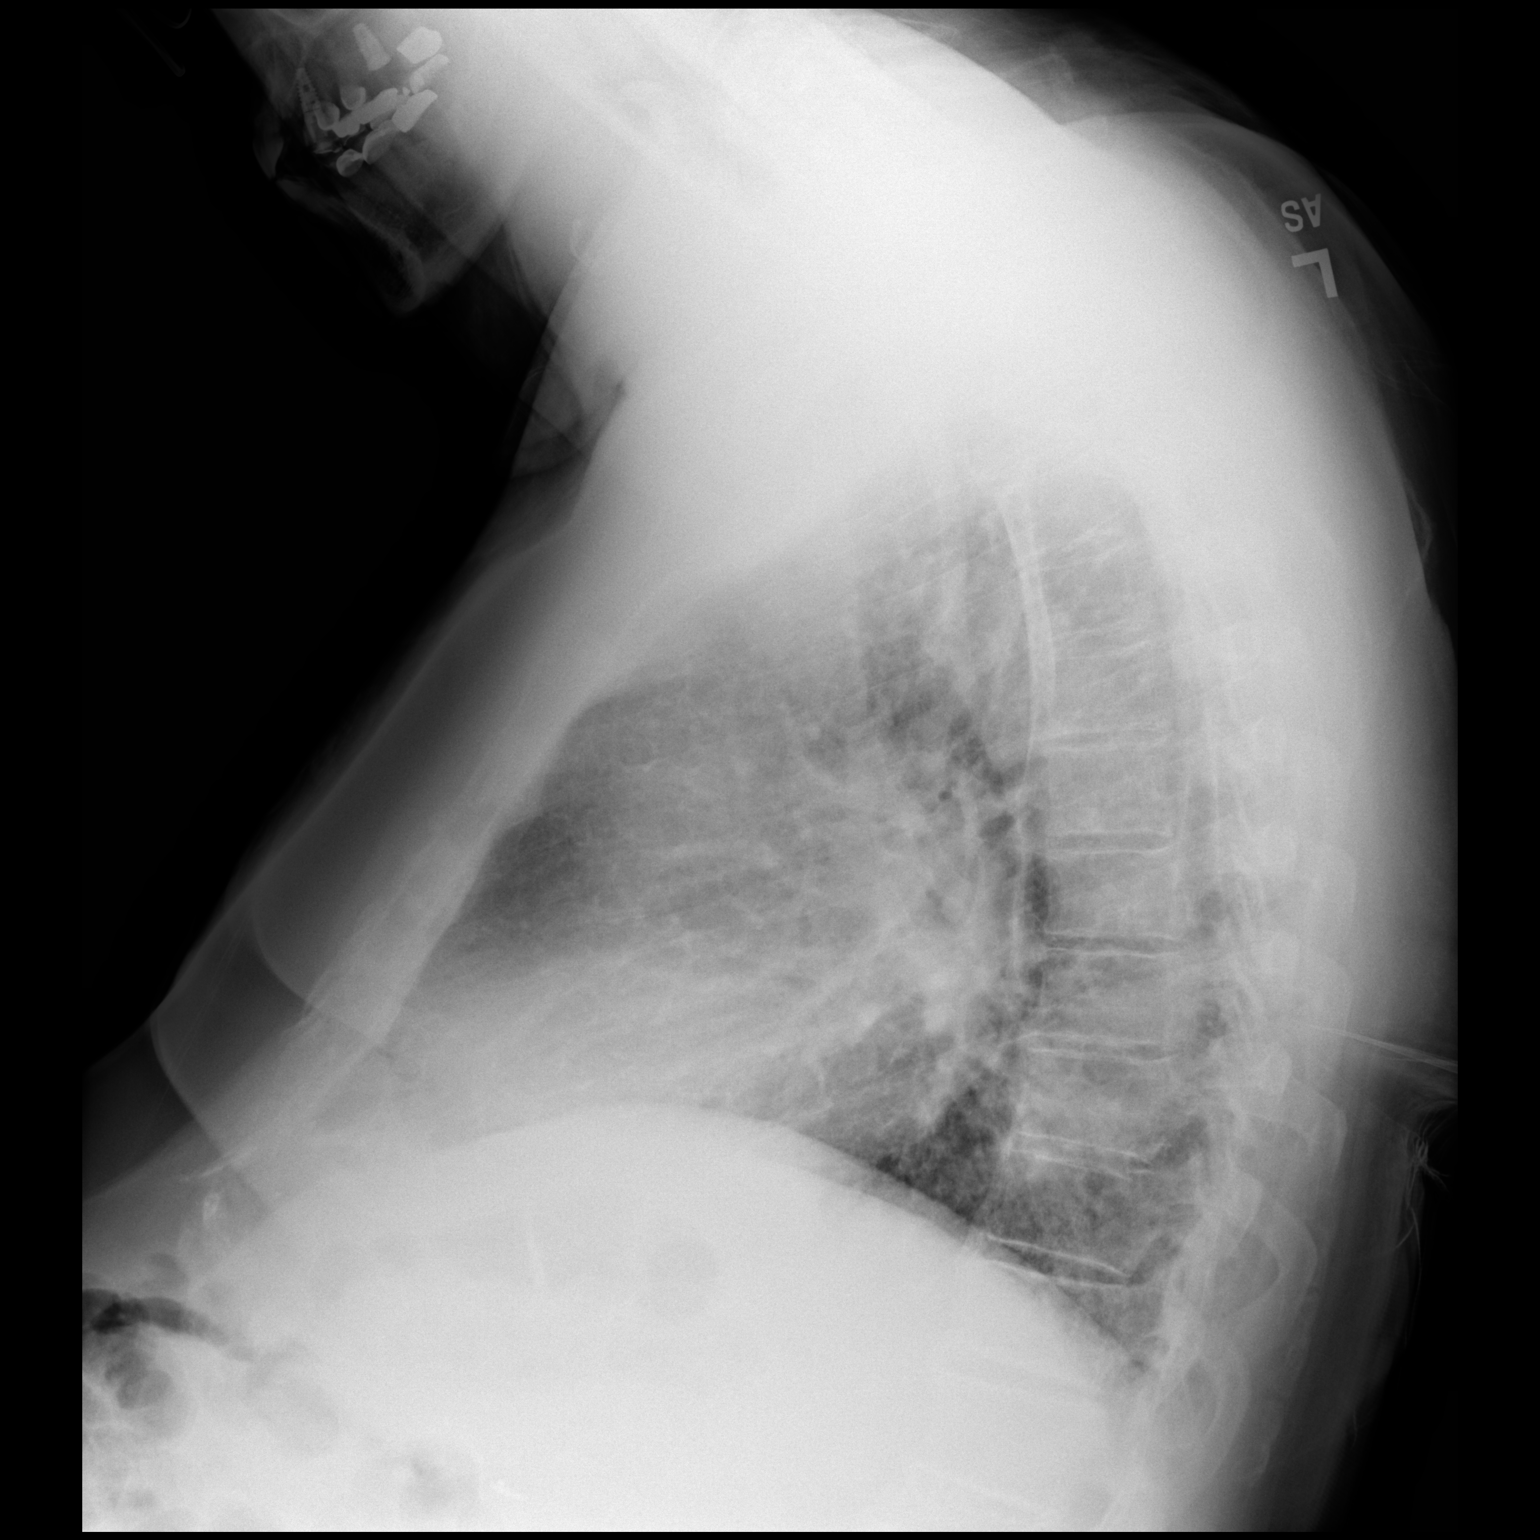

[dg chest 2 view (2 of 2)]
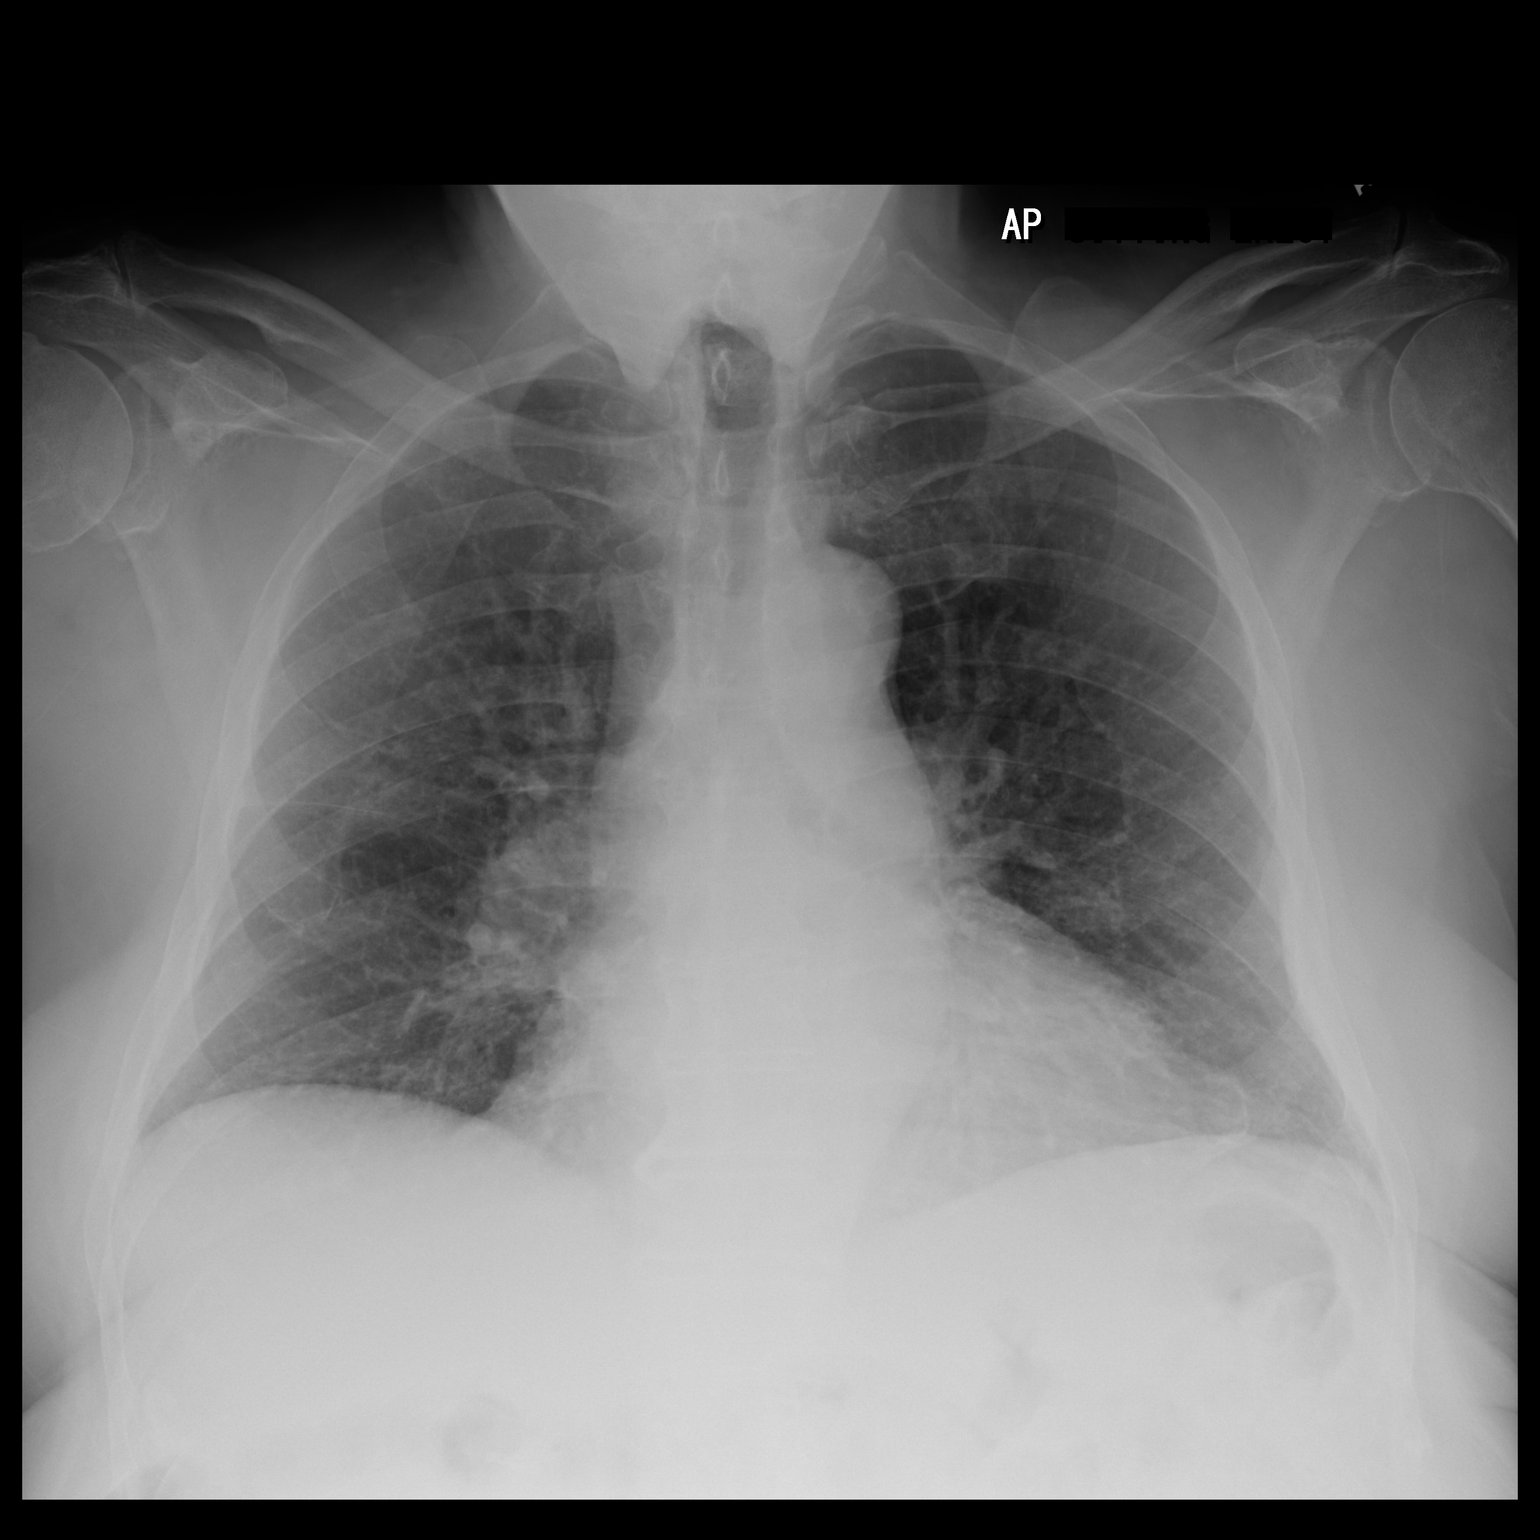

[2 of 2 positions shown; findings below may reference images not displayed]

FINDINGS: Seated upright AP and lateral views of the chest. Stable
cardiomegaly and mediastinal contours. Visualized tracheal air
column is within normal limits. Stable lung volumes. No
pneumothorax, pleural effusion or consolidation. Pulmonary
vascularity appears stable or improved from prior studies. No
pulmonary edema. Mild chronic increased interstitial markings are
stable. No acute opacity. No acute osseous abnormality identified.
Negative visible bowel gas pattern.
IMPRESSION: No acute cardiopulmonary abnormality.

## 2018-05-19 ENCOUNTER — Other Ambulatory Visit: Payer: Self-pay | Admitting: Cardiology

## 2018-05-25 ENCOUNTER — Other Ambulatory Visit: Payer: Self-pay | Admitting: Cardiology

## 2018-06-02 ENCOUNTER — Other Ambulatory Visit: Payer: Self-pay | Admitting: Cardiology

## 2018-06-02 DIAGNOSIS — I4819 Other persistent atrial fibrillation: Secondary | ICD-10-CM

## 2018-06-04 NOTE — Telephone Encounter (Signed)
Rx request sent to pharmacy.  

## 2018-06-18 DIAGNOSIS — R197 Diarrhea, unspecified: Secondary | ICD-10-CM | POA: Diagnosis not present

## 2018-06-18 DIAGNOSIS — F411 Generalized anxiety disorder: Secondary | ICD-10-CM | POA: Diagnosis not present

## 2018-06-18 DIAGNOSIS — R8290 Unspecified abnormal findings in urine: Secondary | ICD-10-CM | POA: Diagnosis not present

## 2018-06-18 DIAGNOSIS — R32 Unspecified urinary incontinence: Secondary | ICD-10-CM | POA: Diagnosis not present

## 2018-06-26 NOTE — Progress Notes (Signed)
@Patient  ID: Jerry [name], male    DOB: 1929/11/04, 85 y.o.   MRN: [id number]  Chief Complaint  Patient presents with  . Follow-up    O2 all the time 2L but quickly drops without it    Referring provider: Alroy Dust, L.Marlou Sa, MD  HPI: 85 year old male, former smoker. PMH significant for ILD, chronic respiratory failure with hypoxia, OSA, nocturnal hypoxia. Patient of Dr. Elsworth Soho, last seen 03/29/18. Maintained on CPAP and portable oxygen. Not a candidate for anti-fiboric therapies and is not interested d/t SE's. Referred to pulmonary rehab.   06/27/2018 Patient presents today for 3 month follow-up visit. Accompanied by his wife. Moved 1 week ago to independent living. Some difficulty/confusion keeping straight the amount of help he is getting at the facility. He has seen social workers, nurses and dieticians. Breathing has been fine, no significant change. Some fatigue with activities, O2 drops when off oxygen. Still on 2L O2 24/7. Wearing CPAP on occasion, states that it is annoying to wear. States that he can do better. Taking xanax as needed, some days he has taken it twice for anxiety.  Not currently receiving pulmonary rehab. Denies cough or wheeze.   Sig testing: 01/2018 PFT - FVC 2.72 (69%), FEV1 2.27 (82%), ratio 84, DLCOunc 42  09/2017 HRCT- Pulmonary parenchymal pattern of mild subpleural fibrosis may be due to nonspecific interstitial pneumonitis or usual interstitial pneumonitis. 4 mm subpleural right middle lobe nodule, likely a subpleural lymph node  Allergies  Allergen Reactions  . Crab [Shellfish Allergy] Rash  . Hydrocodone Hives  . Adhesive [Tape] Rash  . Oxycodone Rash    Immunization History  Administered Date(s) Administered  . H1N1 04/13/2010  . Influenza, High Dose Seasonal PF 02/12/2014, 03/29/2018  . Influenza-Unspecified 03/06/2013, 02/12/2014, 03/06/2017  . Pneumococcal Conjugate-13 02/12/2014  . Pneumococcal Polysaccharide-23 02/04/2013, 02/12/2014    . Td 09/04/2005  . Tdap 12/11/2012  . Zoster 07/07/2006    Past Medical History:  Diagnosis Date  . Anxiety   . CAD S/P percutaneous coronary angioplasty 1996, 2009, 01/2010   PTCA of L Cx 1996; BMS-RCA [facility]) 2009; Staged PCI RCA (70% ISR) --> 3.0 mm x 23 mm BMS, staged PCI Diag - 2.0 mm x 12 mm Mini Vision BMS (2.25 mm)  . Cataracts, bilateral    immature  . Chronic back pain    scoliosis--pt states unable to have surgery bc of age  . GERD (gastroesophageal reflux disease)    takes Nexium daily  . History of colon polyps   . Hyperlipidemia   . Hypertension   . Myocardial infarction [facility]) 1996, 2009   x 2;last time was about 8-30yrs ago   . Obesity (BMI 30.0-34.9)   . OSA (obstructive sleep apnea)   . Osteoarthritis of both knees   . Peripheral edema   . Persistent atrial fibrillation   . Prostate cancer ([place]) 2005   seed implant    Tobacco History: Social History   Tobacco Use  Smoking Status Former Smoker  . Types: Pipe  . Last attempt to quit: 03/12/1984  . Years since quitting: 34.3  Smokeless Tobacco Never Used  Tobacco Comment   quit smoking pipe about 74yrs ago   Counseling given: Not Answered Comment: quit smoking pipe about 56yrs ago   Outpatient Medications Prior to Visit  Medication Sig Dispense Refill  . acetaminophen (TYLENOL) 500 MG tablet Take 1,000 mg by mouth daily as needed (pain).    [name] apixaban (ELIQUIS) 5 MG TABS tablet Take  1 tablet (5 mg total) by mouth 2 (two) times daily. Schedule f/u with cardiologist prior to next refill request* 180 tablet 0  . atorvastatin (LIPITOR) 40 MG tablet TAKE 1 TABLET DAILY AT BEDTIME 90 tablet 1  . Cholecalciferol (VITAMIN D3) 1000 units CAPS Take 1 capsule by mouth daily.     . diazepam (VALIUM) 2 MG tablet Take 2 mg by mouth daily as needed for anxiety.     [name] diltiazem (CARTIA XT) 120 MG 24 hr capsule Take 1 capsule (120 mg total) by mouth daily. Please schedule appointment for refills. 90 capsule 0  .  dofetilide (TIKOSYN) 500 MCG capsule TAKE 1 CAPSULE TWICE A DAY 180 capsule 1  . esomeprazole (NEXIUM) 40 MG capsule TAKE 40 mg CAPSULE DAILY 90 capsule 1  . furosemide (LASIX) 20 MG tablet Take 1 tablet (20 mg total) by mouth daily as needed. Call for appointment 90 tablet 0  . hydrocortisone cream 0.5 % Apply 1 application topically daily. Use as directed    . losartan (COZAAR) 100 MG tablet Take 1 tablet (100 mg total) by mouth daily. Please schedule appointment for refills. 90 tablet 0  . magnesium hydroxide (MILK OF MAGNESIA) 400 MG/5ML suspension Take 30 mLs by mouth daily as needed for mild constipation.    . magnesium oxide (MAG-OX) 400 MG tablet Take 1 tablet (400 mg total) by mouth 2 (two) times daily. 60 tablet 6  . mirabegron ER (MYRBETRIQ) 25 MG TB24 tablet Take 25 mg by mouth daily.     [name] NITROSTAT 0.4 MG SL tablet USE AS NEEDED 25 tablet 3  . Polyethylene Glycol 3350 (MIRALAX PO) Take 17 g by mouth daily as needed (Constipation).     . potassium chloride SA (K-DUR,KLOR-CON) 20 MEQ tablet TAKE 1 TABLET DAILY 90 tablet 2  . tamsulosin (FLOMAX) 0.4 MG CAPS capsule Take 0.4 mg by mouth at bedtime.      No facility-administered medications prior to visit.     Review of Systems  Review of Systems  Constitutional: Positive for fatigue.  Respiratory: Negative for cough, chest tightness, shortness of breath, wheezing and stridor.   Cardiovascular: Negative.     Physical Exam  BP 104/62 (BP Location: Left Arm, Cuff Size: Normal)   Pulse 70   Temp 97.8 F (36.6 C)   Ht 5\' 10"  (1.778 m)   SpO2 98%   BMI 32.43 kg/m  Physical Exam Constitutional:      Appearance: Normal appearance.  HENT:     Head: Normocephalic and atraumatic.  Cardiovascular:     Rate and Rhythm: Normal rate and regular rhythm.     Comments: Trace BLE edema  Pulmonary:     Effort: Pulmonary effort is normal. No respiratory distress.     Breath sounds: No wheezing.     Comments: Mostly clear, fine  crackles right base Musculoskeletal:     Comments: In WC  Neurological:     General: No focal deficit present.     Mental Status: He is alert. Mental status is at baseline.  Psychiatric:        Mood and Affect: Mood normal.        Behavior: Behavior normal.      Lab Results:  CBC    Component Value Date/Time   WBC 10.5 06/13/2017 2107   RBC 4.39 06/13/2017 2107   HGB 14.0 06/13/2017 2107   HGB 14.8 12/05/2016 1208   HCT 43.4 06/13/2017 2107   HCT 42.8 12/05/2016 1208  PLT 217 06/13/2017 2107   PLT 211 12/05/2016 1208   MCV 98.9 06/13/2017 2107   MCV 96 12/05/2016 1208   MCH 31.9 06/13/2017 2107   MCHC 32.3 06/13/2017 2107   RDW 13.0 06/13/2017 2107   RDW 13.9 12/05/2016 1208   LYMPHSABS 1,818 02/24/2016 1233   MONOABS 707 02/24/2016 1233   EOSABS 404 02/24/2016 1233   BASOSABS 101 02/24/2016 1233    BMET    Component Value Date/Time   NA 139 02/21/2018 1541   NA 140 12/05/2016 1208   K 4.3 02/21/2018 1541   CL 103 02/21/2018 1541   CO2 31 02/21/2018 1541   GLUCOSE 104 (H) 02/21/2018 1541   BUN 25 (H) 02/21/2018 1541   BUN 15 12/05/2016 1208   CREATININE 0.96 02/21/2018 1541   CREATININE 1.14 (H) 04/07/2016 1355   CALCIUM 9.1 02/21/2018 1541   GFRNONAA >60 06/13/2017 2107   GFRAA >60 06/13/2017 2107    BNP    Component Value Date/Time   BNP 70.7 05/27/2016 2240   BNP 261.1 (H) 06/09/2015 1628    ProBNP No results found for: PROBNP  Imaging: No results found.   Assessment & Plan:   OSA (obstructive sleep apnea) Good compliance, reports benefit from use  CPAP pressure 8-15cm H20, AHI 1.7  Encourage wear CPAP every night for 4-6 hours or more Do not drive if experiencing excessive fatigue or take sedation medication prior to bedtime  ILD (interstitial lung disease) ([place]) Does not appear progressive Symptoms are stable, continues on 2L oxygen  Referred to pulmonary rehab  Needs repeat HRCT in April for 1 year follow-up   FU in 3-4  months  Martyn Ehrich, NP 06/27/2018

## 2018-06-27 ENCOUNTER — Encounter: Payer: Self-pay | Admitting: Primary Care

## 2018-06-27 ENCOUNTER — Ambulatory Visit (INDEPENDENT_AMBULATORY_CARE_PROVIDER_SITE_OTHER): Payer: Medicare Other | Admitting: Primary Care

## 2018-06-27 VITALS — BP 104/62 | HR 70 | Temp 97.8°F | Ht 70.0 in

## 2018-06-27 DIAGNOSIS — G4733 Obstructive sleep apnea (adult) (pediatric): Secondary | ICD-10-CM | POA: Diagnosis not present

## 2018-06-27 DIAGNOSIS — J849 Interstitial pulmonary disease, unspecified: Secondary | ICD-10-CM | POA: Diagnosis not present

## 2018-06-27 NOTE — Assessment & Plan Note (Addendum)
Good compliance, reports benefit from use  CPAP pressure 8-15cm H20, AHI 1.7  Encourage wear CPAP every night for 4-6 hours or more Do not drive if experiencing excessive fatigue or take sedation medication prior to bedtime

## 2018-06-27 NOTE — Assessment & Plan Note (Signed)
Does not appear progressive Symptoms are stable, continues on 2L oxygen  Referred to pulmonary rehab  Needs repeat HRCT in April for 1 year follow-up

## 2018-06-27 NOTE — Patient Instructions (Addendum)
Continue doing most things yourself, ask for assistant when needed  "IF YOU DON'T USE IT, YOU LOSE IT!"  Encourage wear CPAP every night for 4-6 hours or more every night Do not drive if experiencing excessive fatigue or take sedation medication prior to bedtime  Continue Oxygen 2 L during the day and with CPAP   Continue Lasix 20mg  daily  Refer to pulmonary rehab re: ILD   Return in 3-4 months or sooner if needed    Living With Sleep Apnea Sleep apnea is a condition in which breathing pauses or becomes shallow during sleep. Sleep apnea is most commonly caused by a collapsed or blocked airway. People with sleep apnea snore loudly and have times when they gasp and stop breathing for 10 seconds or more during sleep. This happens over and over during the night. This disrupts your sleep and keeps your body from getting the rest that it needs, which can cause tiredness and lack of energy (fatigue) during the day. The breaks in breathing also interrupt the deep sleep that you need to feel rested. Even if you do not completely wake up from the gaps in breathing, your sleep may not be restful. You may also have a headache in the morning and low energy during the day, and you may feel anxious or depressed. How can sleep apnea affect me? Sleep apnea increases your chances of extreme tiredness during the day (daytime fatigue). It can also increase your risk for health conditions, such as:  Heart attack.  Stroke.  Diabetes.  Heart failure.  Irregular heartbeat.  High blood pressure. If you have daytime fatigue as a result of sleep apnea, you may be more likely to:  Perform poorly at school or work.  Fall asleep while driving.  Have difficulty with attention.  Develop depression or anxiety.  Become severely overweight (obese).  Have sexual dysfunction. What actions can I take to manage sleep apnea? Sleep apnea treatment   If you were given a device to open your airway while you  sleep, use it only as told by your health care provider. You may be given: ? An oral appliance. This is a custom-made mouthpiece that shifts your lower jaw forward. ? A continuous positive airway pressure (CPAP) device. This device blows air through a mask when you breathe out (exhale). ? A nasal expiratory positive airway pressure (EPAP) device. This device has valves that you put into each nostril. ? A bi-level positive airway pressure (BPAP) device. This device blows air through a mask when you breathe in (inhale) and breathe out (exhale).  You may need surgery if other treatments do not work for you. Sleep habits  Go to sleep and wake up at the same time every day. This helps set your internal clock (circadian rhythm) for sleeping. ? If you stay up later than usual, such as on weekends, try to get up in the morning within 2 hours of your normal wake time.  Try to get at least 7-9 hours of sleep each night.  Stop computer, tablet, and mobile phone use a few hours before bedtime.  Do not take long naps during the day. If you nap, limit it to 30 minutes.  Have a relaxing bedtime routine. Reading or listening to music may relax you and help you sleep.  Use your bedroom only for sleep. ? Keep your television and computer out of your bedroom. ? Keep your bedroom cool, dark, and quiet. ? Use a supportive mattress and pillows.  Follow  your health care provider's instructions for other changes to sleep habits. Nutrition  Do not eat heavy meals in the evening.  Do not have caffeine in the later part of the day. The effects of caffeine can last for more than 5 hours.  Follow your health care provider's or dietitian's instructions for any diet changes. Lifestyle      Do not drink alcohol before bedtime. Alcohol can cause you to fall asleep at first, but then it can cause you to wake up in the middle of the night and have trouble getting back to sleep.  Do not use any products that  contain nicotine or tobacco, such as cigarettes and e-cigarettes. If you need help quitting, ask your health care provider. Medicines  Take over-the-counter and prescription medicines only as told by your health care provider.  Do not use over-the-counter sleep medicine. You can become dependent on this medicine, and it can make sleep apnea worse.  Do not use medicines, such as sedatives and narcotics, unless told by your health care provider. Activity  Exercise on most days, but avoid exercising in the evening. Exercising near bedtime can interfere with sleeping.  If possible, spend time outside every day. Natural light helps regulate your circadian rhythm. General information  Lose weight if you need to, and maintain a healthy weight.  Keep all follow-up visits as told by your health care provider. This is important.  If you are having surgery, make sure to tell your health care provider that you have sleep apnea. You may need to bring your device with you. Where to find more information Learn more about sleep apnea and daytime fatigue from:  American Sleep Association: sleepassociation.[place]: sleepfoundation.org  National Heart, Lung, and Blood Institute: https://www.[place].biz/ Summary  Sleep apnea can cause daytime fatigue and other serious health conditions.  Both sleep apnea and daytime fatigue can be bad for your health and well-being.  You may need to wear a device while sleeping to help keep your airway open.  If you are having surgery, make sure to tell your health care provider that you have sleep apnea. You may need to bring your device with you.  Making changes to sleep habits, diet, lifestyle, and activity can help you manage sleep apnea. This information is not intended to replace advice given to you by your health care provider. Make sure you discuss any questions you have with your health care provider. Document Released: 08/17/2017 Document  Revised: 01/23/2018 Document Reviewed: 08/17/2017 Elsevier Interactive Patient Education  [employer].

## 2018-06-28 ENCOUNTER — Other Ambulatory Visit: Payer: Self-pay

## 2018-06-28 DIAGNOSIS — J849 Interstitial pulmonary disease, unspecified: Secondary | ICD-10-CM

## 2018-06-29 ENCOUNTER — Telehealth (HOSPITAL_COMMUNITY): Payer: Self-pay

## 2018-06-29 NOTE — Telephone Encounter (Signed)
Referral received from [facility] for Pulmonary Rehab with diagnosis of ILD. Clinical review of pt follow up appt on 06/27/18 Pulmonary office note. Pt appropriate for scheduling for Pulmonary rehab.  Will forward to support staff for scheduling and verification of insurance eligibility/benefits with pt consent.   Joycelyn Man, RN, BSN Cardiac and Pulmonary Rehab Nurse

## 2018-06-29 NOTE — Progress Notes (Signed)
Agree with above assessment and plan 

## 2018-07-06 ENCOUNTER — Telehealth (HOSPITAL_COMMUNITY): Payer: Self-pay

## 2018-07-06 NOTE — Telephone Encounter (Signed)
Pt insurance is active and benefits verified through Medicare A/B. Co-pay $0.00, DED $198.00/$0.00 met, out of pocket $0.00/$0.00 met, co-insurance 20%. No pre-authorization required. 07/06/2018  2ndary insurance is active and benefits verified through [facility]. Co-pay $0.00, DED $1,500.00/$15.00 met, out of pocket $0.00/$0.00 met, co-insurance 0%. No pre-authorization required. [place]/[employer], 07/06/2018 @ 3:56PM, JZB#[id number]

## 2018-07-06 NOTE — Telephone Encounter (Signed)
Called patient to see if he was interested in participating in the Pulmonary Rehab Program. Patient stated not at this time due to all his other doctor appts.  Closed referral

## 2018-07-07 ENCOUNTER — Other Ambulatory Visit: Payer: Self-pay | Admitting: Physician Assistant

## 2018-07-09 DIAGNOSIS — R2681 Unsteadiness on feet: Secondary | ICD-10-CM | POA: Diagnosis not present

## 2018-07-09 DIAGNOSIS — R41841 Cognitive communication deficit: Secondary | ICD-10-CM | POA: Diagnosis not present

## 2018-07-09 DIAGNOSIS — Z7389 Other problems related to life management difficulty: Secondary | ICD-10-CM | POA: Diagnosis not present

## 2018-07-09 DIAGNOSIS — I509 Heart failure, unspecified: Secondary | ICD-10-CM | POA: Diagnosis not present

## 2018-07-09 DIAGNOSIS — R1313 Dysphagia, pharyngeal phase: Secondary | ICD-10-CM | POA: Diagnosis not present

## 2018-07-09 DIAGNOSIS — N3941 Urge incontinence: Secondary | ICD-10-CM | POA: Diagnosis not present

## 2018-07-09 DIAGNOSIS — M150 Primary generalized (osteo)arthritis: Secondary | ICD-10-CM | POA: Diagnosis not present

## 2018-07-09 DIAGNOSIS — M6281 Muscle weakness (generalized): Secondary | ICD-10-CM | POA: Diagnosis not present

## 2018-07-09 DIAGNOSIS — R531 Weakness: Secondary | ICD-10-CM | POA: Diagnosis not present

## 2018-07-11 ENCOUNTER — Other Ambulatory Visit: Payer: Self-pay | Admitting: Cardiology

## 2018-07-11 DIAGNOSIS — I509 Heart failure, unspecified: Secondary | ICD-10-CM | POA: Diagnosis not present

## 2018-07-11 DIAGNOSIS — M6281 Muscle weakness (generalized): Secondary | ICD-10-CM | POA: Diagnosis not present

## 2018-07-11 DIAGNOSIS — M150 Primary generalized (osteo)arthritis: Secondary | ICD-10-CM | POA: Diagnosis not present

## 2018-07-11 DIAGNOSIS — R2681 Unsteadiness on feet: Secondary | ICD-10-CM | POA: Diagnosis not present

## 2018-07-11 DIAGNOSIS — R531 Weakness: Secondary | ICD-10-CM | POA: Diagnosis not present

## 2018-07-11 DIAGNOSIS — Z7389 Other problems related to life management difficulty: Secondary | ICD-10-CM | POA: Diagnosis not present

## 2018-07-17 DIAGNOSIS — R2681 Unsteadiness on feet: Secondary | ICD-10-CM | POA: Diagnosis not present

## 2018-07-17 DIAGNOSIS — R531 Weakness: Secondary | ICD-10-CM | POA: Diagnosis not present

## 2018-07-17 DIAGNOSIS — M150 Primary generalized (osteo)arthritis: Secondary | ICD-10-CM | POA: Diagnosis not present

## 2018-07-17 DIAGNOSIS — Z7389 Other problems related to life management difficulty: Secondary | ICD-10-CM | POA: Diagnosis not present

## 2018-07-17 DIAGNOSIS — M6281 Muscle weakness (generalized): Secondary | ICD-10-CM | POA: Diagnosis not present

## 2018-07-17 DIAGNOSIS — I509 Heart failure, unspecified: Secondary | ICD-10-CM | POA: Diagnosis not present

## 2018-07-19 ENCOUNTER — Ambulatory Visit (HOSPITAL_COMMUNITY)
Admission: RE | Admit: 2018-07-19 | Discharge: 2018-07-19 | Disposition: A | Discharge status: Home / Self Care | Payer: Medicare Other | Source: Ambulatory Visit | Attending: Nurse Practitioner | Admitting: Nurse Practitioner

## 2018-07-19 ENCOUNTER — Encounter (HOSPITAL_COMMUNITY): Payer: Self-pay | Admitting: Nurse Practitioner

## 2018-07-19 VITALS — BP 136/74 | HR 94 | Ht 70.0 in | Wt 235.0 lb

## 2018-07-19 DIAGNOSIS — H269 Unspecified cataract: Secondary | ICD-10-CM | POA: Diagnosis not present

## 2018-07-19 DIAGNOSIS — E785 Hyperlipidemia, unspecified: Secondary | ICD-10-CM | POA: Insufficient documentation

## 2018-07-19 DIAGNOSIS — G8929 Other chronic pain: Secondary | ICD-10-CM | POA: Diagnosis not present

## 2018-07-19 DIAGNOSIS — R0602 Shortness of breath: Secondary | ICD-10-CM | POA: Diagnosis not present

## 2018-07-19 DIAGNOSIS — G4733 Obstructive sleep apnea (adult) (pediatric): Secondary | ICD-10-CM | POA: Insufficient documentation

## 2018-07-19 DIAGNOSIS — K219 Gastro-esophageal reflux disease without esophagitis: Secondary | ICD-10-CM | POA: Diagnosis not present

## 2018-07-19 DIAGNOSIS — M549 Dorsalgia, unspecified: Secondary | ICD-10-CM | POA: Insufficient documentation

## 2018-07-19 DIAGNOSIS — J849 Interstitial pulmonary disease, unspecified: Secondary | ICD-10-CM | POA: Insufficient documentation

## 2018-07-19 DIAGNOSIS — Z79899 Other long term (current) drug therapy: Secondary | ICD-10-CM | POA: Diagnosis not present

## 2018-07-19 DIAGNOSIS — I4892 Unspecified atrial flutter: Secondary | ICD-10-CM | POA: Diagnosis not present

## 2018-07-19 DIAGNOSIS — I2510 Atherosclerotic heart disease of native coronary artery without angina pectoris: Secondary | ICD-10-CM | POA: Diagnosis not present

## 2018-07-19 DIAGNOSIS — Z87891 Personal history of nicotine dependence: Secondary | ICD-10-CM | POA: Diagnosis not present

## 2018-07-19 DIAGNOSIS — I10 Essential (primary) hypertension: Secondary | ICD-10-CM | POA: Diagnosis not present

## 2018-07-19 DIAGNOSIS — Z7901 Long term (current) use of anticoagulants: Secondary | ICD-10-CM | POA: Insufficient documentation

## 2018-07-19 DIAGNOSIS — I4819 Other persistent atrial fibrillation: Secondary | ICD-10-CM | POA: Insufficient documentation

## 2018-07-19 LAB — BASIC METABOLIC PANEL
ANION GAP: 4 — AB (ref 5–15)
BUN: 14 mg/dL (ref 8–23)
CHLORIDE: 106 mmol/L (ref 98–111)
CO2: 29 mmol/L (ref 22–32)
Calcium: 8.7 mg/dL — ABNORMAL LOW (ref 8.9–10.3)
Creatinine, Ser: 1.04 mg/dL (ref 0.61–1.24)
GFR calc Af Amer: >60 mL/min (ref >60)
GFR calc non Af Amer: >60 mL/min (ref >60)
Glucose, Bld: 112 mg/dL — ABNORMAL HIGH (ref 70–99)
Potassium: 4.3 mmol/L (ref 3.5–5.1)
Sodium: 139 mmol/L (ref 135–145)

## 2018-07-19 LAB — MAGNESIUM: Magnesium: 1.9 mg/dL (ref 1.7–2.4)

## 2018-07-19 NOTE — Progress Notes (Signed)
Primary Care Physician: Alroy Dust, L.Marlou Sa, MD Referring Physician: Dr. Rayann Heman, [facility] f/u Primary Cardiologist: Dr Eleonore Chiquito is a 85 y.o. male with a h/o persistent afib symptomatic with fatigue and shortness of breath, CAD (last intervention BMS to Diag 2011, prior PTCA to LCx 1996, BMS to RCA 2009), HTN, HLD, OSA, CBP/spinal stenosis who presents for follow up in the [facility] for overdue surveillance of Tikosyn. Last visit was with Tommye Standard, PA one year ago. At that time, he was in rhythm, but the visit with Dr. Rayann Heman the time before, he was in afib and Dr. Rayann Heman stated that if it persisted, the he would opt for rate control going forward. He is not aware that he is out of rhythm,  rate controlled. Qtc is prolonged as well. He has not seen [name] as well for almost 2 years.He has had multiple visits with Pulmonary for treatment of his ILD. He has aged quite a bit since I saw him last. He is in a w/c and on supplemental O2.  Today, he denies symptoms of  shortness of breath, orthopnea, PND, lower extremity edema, dizziness, presyncope, syncope, or neurologic sequela.  Positive for shortness of breath.The patient is tolerating medications without difficulties and is otherwise without complaint today.   Past Medical History:  Diagnosis Date  . Anxiety   . CAD S/P percutaneous coronary angioplasty 1996, 2009, 01/2010   PTCA of L Cx 1996; BMS-RCA [facility]) 2009; Staged PCI RCA (70% ISR) --> 3.0 mm x 23 mm BMS, staged PCI Diag - 2.0 mm x 12 mm Mini Vision BMS (2.25 mm)  . Cataracts, bilateral    immature  . Chronic back pain    scoliosis--pt states unable to have surgery bc of age  . GERD (gastroesophageal reflux disease)    takes Nexium daily  . History of colon polyps   . Hyperlipidemia   . Hypertension   . Myocardial infarction [facility]) 1996, 2009   x 2;last time was about 8-46yrs ago   . Obesity (BMI 30.0-34.9)   . OSA (obstructive sleep apnea)    . Osteoarthritis of both knees   . Peripheral edema   . Persistent atrial fibrillation   . Prostate cancer ([place]) 2005   seed implant   Past Surgical History:  Procedure Laterality Date  . CARDIOVERSION N/A 06/12/2015   Procedure: CARDIOVERSION;  Surgeon: Sanda Klein, MD;  Location: [facility];  Service: Cardiovascular;  Laterality: N/A;  . CARDIOVERSION N/A 08/02/2015   Procedure: CARDIOVERSION;  Surgeon: [name], MD;  Location: [facility];  Service: Cardiovascular;  Laterality: N/A;  . CHOLECYSTECTOMY    . COLONOSCOPY    . CORONARY ANGIOPLASTY WITH STENT PLACEMENT  1996, 2009, August 2011   PTCA of L Cx 1996; BMS-RCA [facility]) 2009; Staged PCI RCA (70% ISR) --> 3.0 mm x 23 mm BMS, staged PCI Diag - 2.0 mm x 12 mm Mini Vision BMS (2.25 mm)  . cyst removed  in college  . NEUROPLASTY / TRANSPOSITION MEDIAN NERVE AT CARPAL TUNNEL BILATERAL    . NM MYOVIEW LTD  November 2013   EF 53%; fixed mid inferior-inferolateral defect, infarct with no ischemia.  [name] NM MYOVIEW LTD  11/24/2010   ejection fraction 58%,left ventricle is normal size ,no evidence of inducible myocardial ischemia  . right knee arthrsoscopy  45yrs ago  . right trigger finger    . seeds placed into prostate   2005  . TEE WITHOUT CARDIOVERSION N/A  06/12/2015   Procedure: TRANSESOPHAGEAL ECHOCARDIOGRAM (TEE);  Surgeon: Sanda Klein, MD;  Location: [facility] ENDOSCOPY;  Service: Cardiovascular: EF 55-60%.No LA or LAA thrombus. Normal Peal PAP ~33 mmHg.  [name] TOTAL KNEE ARTHROPLASTY  05/09/2012   Procedure: TOTAL KNEE ARTHROPLASTY;  Surgeon: Kerin Salen, MD;  Location: [facility];  Service: Orthopedics;  Laterality: Right;  . TRANSTHORACIC ECHOCARDIOGRAM  06/2015   EF 60-65%. Mild LA dilation. Normal PA pressures: 32 mmHg. Aortic sclerosis but no stenosis.  [name] wisdom teeth extraccted      Current Outpatient Medications  Medication Sig Dispense Refill  . acetaminophen (TYLENOL) 500 MG tablet Take 1,000 mg by mouth daily as needed (pain).     [name] atorvastatin (LIPITOR) 40 MG tablet TAKE 1 TABLET DAILY AT BEDTIME 90 tablet 1  . Cholecalciferol (VITAMIN D3) 1000 units CAPS Take 1 capsule by mouth daily.     . diazepam (VALIUM) 2 MG tablet Take 2 mg by mouth daily as needed for anxiety.     [name] diltiazem (CARTIA XT) 120 MG 24 hr capsule Take 1 capsule (120 mg total) by mouth daily. Please schedule appointment for refills. 90 capsule 0  . dofetilide (TIKOSYN) 500 MCG capsule Take 1 capsule by mouth twice a day. Please make appt for future refills 1st attempt. 60 capsule 0  . ELIQUIS 5 MG TABS tablet TAKE 1 TABLET TWICE A DAY (SCHEDULE FOLLOW UP WITH CARDIOLOGIST PRIOR TO NEXT REFILL REQUEST) 180 tablet 4  . esomeprazole (NEXIUM) 40 MG capsule TAKE 40 mg CAPSULE DAILY 90 capsule 1  . furosemide (LASIX) 20 MG tablet Take 1 tablet (20 mg total) by mouth daily as needed. Call for appointment 90 tablet 0  . hydrocortisone cream 0.5 % Apply 1 application topically daily. Use as directed    . losartan (COZAAR) 100 MG tablet Take 1 tablet (100 mg total) by mouth daily. Please schedule appointment for refills. 90 tablet 0  . magnesium oxide (MAG-OX) 400 MG tablet Take 1 tablet (400 mg total) by mouth 2 (two) times daily. 60 tablet 6  . mirabegron ER (MYRBETRIQ) 25 MG TB24 tablet Take 25 mg by mouth daily.     [name] NITROSTAT 0.4 MG SL tablet USE AS NEEDED 25 tablet 3  . Polyethylene Glycol 3350 (MIRALAX PO) Take 17 g by mouth daily as needed (Constipation).     . potassium chloride SA (K-DUR,KLOR-CON) 20 MEQ tablet TAKE 1 TABLET DAILY 90 tablet 2  . tamsulosin (FLOMAX) 0.4 MG CAPS capsule Take 0.4 mg by mouth at bedtime.     . magnesium hydroxide (MILK OF MAGNESIA) 400 MG/5ML suspension Take 30 mLs by mouth daily as needed for mild constipation.     No current facility-administered medications for this encounter.     Allergies  Allergen Reactions  . Crab [Shellfish Allergy] Rash  . Hydrocodone Hives  . Adhesive [Tape] Rash  . Oxycodone Rash     Social History   Socioeconomic History  . Marital status: Married    Spouse name: Not on file  . Number of children: Not on file  . Years of education: Not on file  . Highest education level: Not on file  Occupational History  . Occupation: Retired  Scientific laboratory technician  . Financial resource strain: Not on file  . Food insecurity:    Worry: Not on file    Inability: Not on file  . Transportation needs:    Medical: Not on file    Non-medical: Not on file  Tobacco Use  .  Smoking status: Former Smoker    Types: Pipe    Last attempt to quit: 03/12/1984    Years since quitting: 34.3  . Smokeless tobacco: Never Used  . Tobacco comment: quit smoking pipe about 32yrs ago  Substance and Sexual Activity  . Alcohol use: Yes    Comment: glass of wine daily  . Drug use: No  . Sexual activity: Not Currently  Lifestyle  . Physical activity:    Days per week: Not on file    Minutes per session: Not on file  . Stress: Not on file  Relationships  . Social connections:    Talks on phone: Not on file    Gets together: Not on file    Attends religious service: Not on file    Active member of club or organization: Not on file    Attends meetings of clubs or organizations: Not on file    Relationship status: Not on file  . Intimate partner violence:    Fear of current or ex partner: Not on file    Emotionally abused: Not on file    Physically abused: Not on file    Forced sexual activity: Not on file  Other Topics Concern  . Not on file  Social History Narrative   He is a married father of 2, grandfather of 2. He exercises at least 3 to 4 times a week but does something 6 to 7 days a week. He does not smoke. Drinks occasional alcohol.     Family History  Problem Relation Age of Onset  . Cancer Mother 60       abdomen died in 58s  . Heart attack Father 80       died in 57s    ROS- All systems are reviewed and negative except as per the HPI above  Physical Exam: Vitals:    07/19/18 1502  Weight: 106.6 kg  Height: 5\' 10"  (1.778 m)    GEN- The patient is well appearing obese male, alert and oriented x 3 today.   HEENT-head normocephalic, atraumatic, sclera clear, conjunctiva pink, hearing intact, trachea midline. Lungs- Clear to ausculation bilaterally, normal work of breathing Heart- irregular rate and rhythm, no murmurs, rubs or gallops  GI- soft, NT, ND, + BS Extremities- no clubbing, cyanosis, or edema MS- no significant deformity or atrophy Skin- no rash or lesion Psych- euthymic mood, full affect Neuro- strength and sensation are intact   EKG -atrial flutter with variable block, pr int 94 bpm, qrs 88 ms, qtc 505 ms  Epic records reviewed    Assessment and Plan: 1. Persistent atrial fibrillation/flutter Lost to f/u for the last year Appears to in atrial flutter, pt unaware I feel that Tikosyn should be stopped as he is out of rhythm, asymptomatic  Qtc is also prolonged I don't feel the risk of the drug is worth the benefit He will be rate controlled going forward Continue Eliquis 5 mg BID Bmet/mag today  2. OSA Compliant with CPAP therapy.  3. HTN Stable   4. CAD No anginal symptoms  5. Interstitial lung disease Followed by [facility]. A Avoid amiodarone     F/u here in 2 weeks off Tikosyn, he should continue to be rate controlled as Tikosyn does not affect rate, but will check EKG to be sure May have to increase Cardizem if necessary  Jerry [name], [facility] [address] [place], [place] [address] [phone]

## 2018-07-20 DIAGNOSIS — M150 Primary generalized (osteo)arthritis: Secondary | ICD-10-CM | POA: Diagnosis not present

## 2018-07-20 DIAGNOSIS — R531 Weakness: Secondary | ICD-10-CM | POA: Diagnosis not present

## 2018-07-20 DIAGNOSIS — Z7389 Other problems related to life management difficulty: Secondary | ICD-10-CM | POA: Diagnosis not present

## 2018-07-20 DIAGNOSIS — M6281 Muscle weakness (generalized): Secondary | ICD-10-CM | POA: Diagnosis not present

## 2018-07-20 DIAGNOSIS — I509 Heart failure, unspecified: Secondary | ICD-10-CM | POA: Diagnosis not present

## 2018-07-20 DIAGNOSIS — R2681 Unsteadiness on feet: Secondary | ICD-10-CM | POA: Diagnosis not present

## 2018-07-24 ENCOUNTER — Other Ambulatory Visit: Payer: Self-pay | Admitting: Internal Medicine

## 2018-07-24 DIAGNOSIS — R531 Weakness: Secondary | ICD-10-CM | POA: Diagnosis not present

## 2018-07-24 DIAGNOSIS — R2681 Unsteadiness on feet: Secondary | ICD-10-CM | POA: Diagnosis not present

## 2018-07-24 DIAGNOSIS — M6281 Muscle weakness (generalized): Secondary | ICD-10-CM | POA: Diagnosis not present

## 2018-07-24 DIAGNOSIS — M150 Primary generalized (osteo)arthritis: Secondary | ICD-10-CM | POA: Diagnosis not present

## 2018-07-24 DIAGNOSIS — I509 Heart failure, unspecified: Secondary | ICD-10-CM | POA: Diagnosis not present

## 2018-07-24 DIAGNOSIS — Z7389 Other problems related to life management difficulty: Secondary | ICD-10-CM | POA: Diagnosis not present

## 2018-07-27 DIAGNOSIS — M6281 Muscle weakness (generalized): Secondary | ICD-10-CM | POA: Diagnosis not present

## 2018-07-27 DIAGNOSIS — M150 Primary generalized (osteo)arthritis: Secondary | ICD-10-CM | POA: Diagnosis not present

## 2018-07-27 DIAGNOSIS — Z7389 Other problems related to life management difficulty: Secondary | ICD-10-CM | POA: Diagnosis not present

## 2018-07-27 DIAGNOSIS — R2681 Unsteadiness on feet: Secondary | ICD-10-CM | POA: Diagnosis not present

## 2018-07-27 DIAGNOSIS — R531 Weakness: Secondary | ICD-10-CM | POA: Diagnosis not present

## 2018-07-27 DIAGNOSIS — I509 Heart failure, unspecified: Secondary | ICD-10-CM | POA: Diagnosis not present

## 2018-07-31 DIAGNOSIS — M150 Primary generalized (osteo)arthritis: Secondary | ICD-10-CM | POA: Diagnosis not present

## 2018-07-31 DIAGNOSIS — R2681 Unsteadiness on feet: Secondary | ICD-10-CM | POA: Diagnosis not present

## 2018-07-31 DIAGNOSIS — R531 Weakness: Secondary | ICD-10-CM | POA: Diagnosis not present

## 2018-07-31 DIAGNOSIS — I509 Heart failure, unspecified: Secondary | ICD-10-CM | POA: Diagnosis not present

## 2018-07-31 DIAGNOSIS — M6281 Muscle weakness (generalized): Secondary | ICD-10-CM | POA: Diagnosis not present

## 2018-07-31 DIAGNOSIS — Z7389 Other problems related to life management difficulty: Secondary | ICD-10-CM | POA: Diagnosis not present

## 2018-08-02 ENCOUNTER — Encounter (HOSPITAL_COMMUNITY): Payer: Self-pay | Admitting: Nurse Practitioner

## 2018-08-02 ENCOUNTER — Ambulatory Visit (HOSPITAL_COMMUNITY)
Admission: RE | Admit: 2018-08-02 | Discharge: 2018-08-02 | Disposition: A | Discharge status: Home / Self Care | Payer: Medicare Other | Source: Ambulatory Visit | Attending: Nurse Practitioner | Admitting: Nurse Practitioner

## 2018-08-02 DIAGNOSIS — Z809 Family history of malignant neoplasm, unspecified: Secondary | ICD-10-CM | POA: Insufficient documentation

## 2018-08-02 DIAGNOSIS — R9431 Abnormal electrocardiogram [ECG] [EKG]: Secondary | ICD-10-CM | POA: Insufficient documentation

## 2018-08-02 DIAGNOSIS — Z91013 Allergy to seafood: Secondary | ICD-10-CM | POA: Insufficient documentation

## 2018-08-02 DIAGNOSIS — E785 Hyperlipidemia, unspecified: Secondary | ICD-10-CM | POA: Insufficient documentation

## 2018-08-02 DIAGNOSIS — Z885 Allergy status to narcotic agent status: Secondary | ICD-10-CM | POA: Diagnosis not present

## 2018-08-02 DIAGNOSIS — K219 Gastro-esophageal reflux disease without esophagitis: Secondary | ICD-10-CM | POA: Insufficient documentation

## 2018-08-02 DIAGNOSIS — G4733 Obstructive sleep apnea (adult) (pediatric): Secondary | ICD-10-CM | POA: Diagnosis not present

## 2018-08-02 DIAGNOSIS — I10 Essential (primary) hypertension: Secondary | ICD-10-CM | POA: Insufficient documentation

## 2018-08-02 DIAGNOSIS — I252 Old myocardial infarction: Secondary | ICD-10-CM | POA: Insufficient documentation

## 2018-08-02 DIAGNOSIS — Z79899 Other long term (current) drug therapy: Secondary | ICD-10-CM | POA: Insufficient documentation

## 2018-08-02 DIAGNOSIS — Z886 Allergy status to analgesic agent status: Secondary | ICD-10-CM | POA: Diagnosis not present

## 2018-08-02 DIAGNOSIS — Z87891 Personal history of nicotine dependence: Secondary | ICD-10-CM | POA: Insufficient documentation

## 2018-08-02 DIAGNOSIS — I4892 Unspecified atrial flutter: Secondary | ICD-10-CM | POA: Diagnosis not present

## 2018-08-02 DIAGNOSIS — F419 Anxiety disorder, unspecified: Secondary | ICD-10-CM | POA: Insufficient documentation

## 2018-08-02 DIAGNOSIS — Z8249 Family history of ischemic heart disease and other diseases of the circulatory system: Secondary | ICD-10-CM | POA: Insufficient documentation

## 2018-08-02 DIAGNOSIS — I4819 Other persistent atrial fibrillation: Secondary | ICD-10-CM | POA: Insufficient documentation

## 2018-08-02 DIAGNOSIS — Z7901 Long term (current) use of anticoagulants: Secondary | ICD-10-CM | POA: Diagnosis not present

## 2018-08-02 DIAGNOSIS — Z8546 Personal history of malignant neoplasm of prostate: Secondary | ICD-10-CM | POA: Insufficient documentation

## 2018-08-02 DIAGNOSIS — I2510 Atherosclerotic heart disease of native coronary artery without angina pectoris: Secondary | ICD-10-CM | POA: Diagnosis not present

## 2018-08-02 DIAGNOSIS — J849 Interstitial pulmonary disease, unspecified: Secondary | ICD-10-CM | POA: Diagnosis not present

## 2018-08-02 DIAGNOSIS — E669 Obesity, unspecified: Secondary | ICD-10-CM | POA: Insufficient documentation

## 2018-08-02 DIAGNOSIS — Z96651 Presence of right artificial knee joint: Secondary | ICD-10-CM | POA: Diagnosis not present

## 2018-08-02 DIAGNOSIS — Z9049 Acquired absence of other specified parts of digestive tract: Secondary | ICD-10-CM | POA: Insufficient documentation

## 2018-08-02 DIAGNOSIS — Z6833 Body mass index (BMI) 33.0-33.9, adult: Secondary | ICD-10-CM | POA: Insufficient documentation

## 2018-08-02 DIAGNOSIS — Z7952 Long term (current) use of systemic steroids: Secondary | ICD-10-CM | POA: Insufficient documentation

## 2018-08-02 DIAGNOSIS — Z9861 Coronary angioplasty status: Secondary | ICD-10-CM | POA: Diagnosis not present

## 2018-08-02 DIAGNOSIS — M419 Scoliosis, unspecified: Secondary | ICD-10-CM | POA: Diagnosis not present

## 2018-08-02 DIAGNOSIS — Z888 Allergy status to other drugs, medicaments and biological substances status: Secondary | ICD-10-CM | POA: Diagnosis not present

## 2018-08-02 MED ORDER — LOSARTAN POTASSIUM 100 MG PO TABS: 50.0000 mg | ORAL_TABLET | Freq: Every day | ORAL | 0 refills | Status: DC

## 2018-08-02 MED ORDER — DILTIAZEM HCL ER COATED BEADS 180 MG PO CP24: 180.0000 mg | ORAL_CAPSULE | Freq: Every day | ORAL | 3 refills | Status: DC

## 2018-08-02 NOTE — Progress Notes (Signed)
Primary Care Physician: Alroy Dust, L.Marlou Sa, MD Referring Physician: Dr. Rayann Heman, [facility] f/u Primary Cardiologist: Dr Eleonore Chiquito is a 85 y.o. male with a h/o persistent afib symptomatic with fatigue and shortness of breath, CAD (last intervention BMS to Diag 2011, prior PTCA to LCx 1996, BMS to RCA 2009), HTN, HLD, OSA, CBP/spinal stenosis who presents for follow up in the [facility] for overdue surveillance of Tikosyn. Last visit was with Tommye Standard, PA one year ago. At that time, he was in rhythm, but the visit with Dr. Rayann Heman the time before, he was in afib and Dr. Rayann Heman stated that if it persisted, the he would opt for rate control going forward. He is not aware that he is out of rhythm,  rate controlled. Qtc is prolonged as well. He has not seen [name] as well for almost 2 years.He has had multiple visits with Pulmonary for treatment of his ILD. He has aged quite a bit since I saw him last. He is in a w/c and on supplemental O2.  F/u 2/27, now off tikosyn for failing to keep pt in rhythm. He is now in at afib at 117 bpm. He is asymptomatic but would like to see him better rate controlled.  Today, he denies symptoms of  shortness of breath, orthopnea, PND, lower extremity edema, dizziness, presyncope, syncope, or neurologic sequela.  Positive for shortness of breath.The patient is tolerating medications without difficulties and is otherwise without complaint today.   Past Medical History:  Diagnosis Date  . Anxiety   . CAD S/P percutaneous coronary angioplasty 1996, 2009, 01/2010   PTCA of L Cx 1996; BMS-RCA [facility]) 2009; Staged PCI RCA (70% ISR) --> 3.0 mm x 23 mm BMS, staged PCI Diag - 2.0 mm x 12 mm Mini Vision BMS (2.25 mm)  . Cataracts, bilateral    immature  . Chronic back pain    scoliosis--pt states unable to have surgery bc of age  . GERD (gastroesophageal reflux disease)    takes Nexium daily  . History of colon polyps   . Hyperlipidemia    . Hypertension   . Myocardial infarction [facility]) 1996, 2009   x 2;last time was about 8-55yrs ago   . Obesity (BMI 30.0-34.9)   . OSA (obstructive sleep apnea)   . Osteoarthritis of both knees   . Peripheral edema   . Persistent atrial fibrillation   . Prostate cancer ([place]) 2005   seed implant   Past Surgical History:  Procedure Laterality Date  . CARDIOVERSION N/A 06/12/2015   Procedure: CARDIOVERSION;  Surgeon: Sanda Klein, MD;  Location: [facility];  Service: Cardiovascular;  Laterality: N/A;  . CARDIOVERSION N/A 08/02/2015   Procedure: CARDIOVERSION;  Surgeon: [name], MD;  Location: [facility];  Service: Cardiovascular;  Laterality: N/A;  . CHOLECYSTECTOMY    . COLONOSCOPY    . CORONARY ANGIOPLASTY WITH STENT PLACEMENT  1996, 2009, August 2011   PTCA of L Cx 1996; BMS-RCA [facility]) 2009; Staged PCI RCA (70% ISR) --> 3.0 mm x 23 mm BMS, staged PCI Diag - 2.0 mm x 12 mm Mini Vision BMS (2.25 mm)  . cyst removed  in college  . NEUROPLASTY / TRANSPOSITION MEDIAN NERVE AT CARPAL TUNNEL BILATERAL    . NM MYOVIEW LTD  November 2013   EF 53%; fixed mid inferior-inferolateral defect, infarct with no ischemia.  [name] NM MYOVIEW LTD  11/24/2010   ejection fraction 58%,left ventricle is normal size ,no evidence  of inducible myocardial ischemia  . right knee arthrsoscopy  43yrs ago  . right trigger finger    . seeds placed into prostate   2005  . TEE WITHOUT CARDIOVERSION N/A 06/12/2015   Procedure: TRANSESOPHAGEAL ECHOCARDIOGRAM (TEE);  Surgeon: Sanda Klein, MD;  Location: [facility] ENDOSCOPY;  Service: Cardiovascular: EF 55-60%.No LA or LAA thrombus. Normal Peal PAP ~33 mmHg.  [name] TOTAL KNEE ARTHROPLASTY  05/09/2012   Procedure: TOTAL KNEE ARTHROPLASTY;  Surgeon: Kerin Salen, MD;  Location: [facility];  Service: Orthopedics;  Laterality: Right;  . TRANSTHORACIC ECHOCARDIOGRAM  06/2015   EF 60-65%. Mild LA dilation. Normal PA pressures: 32 mmHg. Aortic sclerosis but no stenosis.  [name] wisdom teeth  extraccted      Current Outpatient Medications  Medication Sig Dispense Refill  . acetaminophen (TYLENOL) 500 MG tablet Take 1,000 mg by mouth daily as needed (pain).    [name] ALPRAZolam (XANAX) 0.25 MG tablet Take 0.25 mg by mouth as needed for anxiety.    [name] atorvastatin (LIPITOR) 40 MG tablet TAKE 1 TABLET DAILY AT BEDTIME 90 tablet 1  . Cholecalciferol (VITAMIN D3) 1000 units CAPS Take 1 capsule by mouth daily.     [name] diltiazem (CARDIZEM CD) 180 MG 24 hr capsule Take 1 capsule (180 mg total) by mouth daily. 30 capsule 3  . ELIQUIS 5 MG TABS tablet TAKE 1 TABLET TWICE A DAY (SCHEDULE FOLLOW UP WITH CARDIOLOGIST PRIOR TO NEXT REFILL REQUEST) 180 tablet 4  . esomeprazole (NEXIUM) 40 MG capsule TAKE 40 mg CAPSULE DAILY 90 capsule 1  . furosemide (LASIX) 20 MG tablet Take 1 tablet (20 mg total) by mouth daily as needed. Call for appointment 90 tablet 0  . hydrocortisone cream 0.5 % Apply 1 application topically daily. Use as directed    . losartan (COZAAR) 100 MG tablet Take 0.5 tablets (50 mg total) by mouth daily. Please schedule appointment for refills. 90 tablet 0  . magnesium hydroxide (MILK OF MAGNESIA) 400 MG/5ML suspension Take 30 mLs by mouth daily as needed for mild constipation.    . magnesium oxide (MAG-OX) 400 MG tablet Take 1 tablet (400 mg total) by mouth 2 (two) times daily. 60 tablet 6  . mirabegron ER (MYRBETRIQ) 25 MG TB24 tablet Take 25 mg by mouth daily.     [name] NITROSTAT 0.4 MG SL tablet USE AS NEEDED 25 tablet 3  . Polyethylene Glycol 3350 (MIRALAX PO) Take 17 g by mouth daily as needed (Constipation).     . potassium chloride SA (K-DUR,KLOR-CON) 20 MEQ tablet Take 1 tablet (20 mEq total) by mouth daily. 30 tablet 0  . tamsulosin (FLOMAX) 0.4 MG CAPS capsule Take 0.4 mg by mouth at bedtime.      No current facility-administered medications for this encounter.     Allergies  Allergen Reactions  . Crab [Shellfish Allergy] Rash  . Hydrocodone Hives  . Adhesive [Tape] Rash  .  Oxycodone Rash    Social History   Socioeconomic History  . Marital status: Married    Spouse name: Not on file  . Number of children: Not on file  . Years of education: Not on file  . Highest education level: Not on file  Occupational History  . Occupation: Retired  Scientific laboratory technician  . Financial resource strain: Not on file  . Food insecurity:    Worry: Not on file    Inability: Not on file  . Transportation needs:    Medical: Not on file    Non-medical:  Not on file  Tobacco Use  . Smoking status: Former Smoker    Types: Pipe    Last attempt to quit: 03/12/1984    Years since quitting: 34.4  . Smokeless tobacco: Never Used  . Tobacco comment: quit smoking pipe about 80yrs ago  Substance and Sexual Activity  . Alcohol use: Yes    Comment: glass of wine daily  . Drug use: No  . Sexual activity: Not Currently  Lifestyle  . Physical activity:    Days per week: Not on file    Minutes per session: Not on file  . Stress: Not on file  Relationships  . Social connections:    Talks on phone: Not on file    Gets together: Not on file    Attends religious service: Not on file    Active member of club or organization: Not on file    Attends meetings of clubs or organizations: Not on file    Relationship status: Not on file  . Intimate partner violence:    Fear of current or ex partner: Not on file    Emotionally abused: Not on file    Physically abused: Not on file    Forced sexual activity: Not on file  Other Topics Concern  . Not on file  Social History Narrative   He is a married father of 2, grandfather of 2. He exercises at least 3 to 4 times a week but does something 6 to 7 days a week. He does not smoke. Drinks occasional alcohol.     Family History  Problem Relation Age of Onset  . Cancer Mother 37       abdomen died in 36s  . Heart attack Father 13       died in 71s    ROS- All systems are reviewed and negative except as per the HPI above  Physical  Exam: Vitals:   08/02/18 1552  BP: 108/70  Pulse: (!) 117  Weight: 106.1 kg  Height: 5\' 10"  (1.778 m)    GEN- The patient is well appearing obese male, alert and oriented x 3 today.   HEENT-head normocephalic, atraumatic, sclera clear, conjunctiva pink, hearing intact, trachea midline. Lungs- Clear to ausculation bilaterally, normal work of breathing Heart- irregular rate and rhythm, no murmurs, rubs or gallops  GI- soft, NT, ND, + BS Extremities- no clubbing, cyanosis, or edema MS- no significant deformity or atrophy Skin- no rash or lesion Psych- euthymic mood, full affect Neuro- strength and sensation are intact   EKG -atrial fib at 117 bpm, qrs int 86 ms, qtc 446 ms    Epic records reviewed    Assessment and Plan: 1. Persistent atrial fibrillation/flutter Tikosyn stopped on last visit as he was out of rhythm and was unaware He now has afib with RVR at 117 bpm Will increase cardizem to 180 mg daily for better rate control Continue Eliquis 5 mg BID  2. OSA Compliant with CPAP therapy.  3. HTN BP soft today, will lower to 50 mg daily to get the extra Cardizem on board  4. CAD No anginal symptoms  5. Interstitial lung disease Followed by [facility]. A Avoid amiodarone    F/u here in one week for EKG/BP check   Butch Penny C. Lakeysha Slutsky, [facility] [address] [place], [place] [address] [phone]

## 2018-08-02 NOTE — Patient Instructions (Signed)
Increase cardizem to 180mg  once a day (new prescription was sent)  Decrease losartan to 50mg  a day (1/2 of your 100mg  tablet)

## 2018-08-03 DIAGNOSIS — I509 Heart failure, unspecified: Secondary | ICD-10-CM | POA: Diagnosis not present

## 2018-08-03 DIAGNOSIS — Z7389 Other problems related to life management difficulty: Secondary | ICD-10-CM | POA: Diagnosis not present

## 2018-08-03 DIAGNOSIS — M6281 Muscle weakness (generalized): Secondary | ICD-10-CM | POA: Diagnosis not present

## 2018-08-03 DIAGNOSIS — R531 Weakness: Secondary | ICD-10-CM | POA: Diagnosis not present

## 2018-08-03 DIAGNOSIS — R2681 Unsteadiness on feet: Secondary | ICD-10-CM | POA: Diagnosis not present

## 2018-08-03 DIAGNOSIS — M150 Primary generalized (osteo)arthritis: Secondary | ICD-10-CM | POA: Diagnosis not present

## 2018-08-07 DIAGNOSIS — R2681 Unsteadiness on feet: Secondary | ICD-10-CM | POA: Diagnosis not present

## 2018-08-07 DIAGNOSIS — I509 Heart failure, unspecified: Secondary | ICD-10-CM | POA: Diagnosis not present

## 2018-08-07 DIAGNOSIS — M6281 Muscle weakness (generalized): Secondary | ICD-10-CM | POA: Diagnosis not present

## 2018-08-07 DIAGNOSIS — M150 Primary generalized (osteo)arthritis: Secondary | ICD-10-CM | POA: Diagnosis not present

## 2018-08-09 ENCOUNTER — Encounter (HOSPITAL_COMMUNITY): Payer: Self-pay | Admitting: Nurse Practitioner

## 2018-08-09 ENCOUNTER — Ambulatory Visit (HOSPITAL_COMMUNITY)
Admission: RE | Admit: 2018-08-09 | Discharge: 2018-08-09 | Disposition: A | Discharge status: Home / Self Care | Payer: Medicare Other | Source: Ambulatory Visit | Attending: Nurse Practitioner | Admitting: Nurse Practitioner

## 2018-08-09 VITALS — BP 108/70 | HR 107 | Ht 70.0 in | Wt 232.0 lb

## 2018-08-09 DIAGNOSIS — Z6833 Body mass index (BMI) 33.0-33.9, adult: Secondary | ICD-10-CM | POA: Diagnosis not present

## 2018-08-09 DIAGNOSIS — Z87891 Personal history of nicotine dependence: Secondary | ICD-10-CM | POA: Diagnosis not present

## 2018-08-09 DIAGNOSIS — I252 Old myocardial infarction: Secondary | ICD-10-CM | POA: Diagnosis not present

## 2018-08-09 DIAGNOSIS — M419 Scoliosis, unspecified: Secondary | ICD-10-CM | POA: Diagnosis not present

## 2018-08-09 DIAGNOSIS — Z7901 Long term (current) use of anticoagulants: Secondary | ICD-10-CM | POA: Diagnosis not present

## 2018-08-09 DIAGNOSIS — Z886 Allergy status to analgesic agent status: Secondary | ICD-10-CM | POA: Diagnosis not present

## 2018-08-09 DIAGNOSIS — G4733 Obstructive sleep apnea (adult) (pediatric): Secondary | ICD-10-CM | POA: Insufficient documentation

## 2018-08-09 DIAGNOSIS — Z9861 Coronary angioplasty status: Secondary | ICD-10-CM | POA: Diagnosis not present

## 2018-08-09 DIAGNOSIS — I4819 Other persistent atrial fibrillation: Secondary | ICD-10-CM | POA: Insufficient documentation

## 2018-08-09 DIAGNOSIS — Z800 Family history of malignant neoplasm of digestive organs: Secondary | ICD-10-CM | POA: Insufficient documentation

## 2018-08-09 DIAGNOSIS — I4892 Unspecified atrial flutter: Secondary | ICD-10-CM | POA: Diagnosis not present

## 2018-08-09 DIAGNOSIS — Z91013 Allergy to seafood: Secondary | ICD-10-CM | POA: Diagnosis not present

## 2018-08-09 DIAGNOSIS — E669 Obesity, unspecified: Secondary | ICD-10-CM | POA: Diagnosis not present

## 2018-08-09 DIAGNOSIS — F419 Anxiety disorder, unspecified: Secondary | ICD-10-CM | POA: Insufficient documentation

## 2018-08-09 DIAGNOSIS — I10 Essential (primary) hypertension: Secondary | ICD-10-CM | POA: Diagnosis not present

## 2018-08-09 DIAGNOSIS — Z9049 Acquired absence of other specified parts of digestive tract: Secondary | ICD-10-CM | POA: Insufficient documentation

## 2018-08-09 DIAGNOSIS — Z96651 Presence of right artificial knee joint: Secondary | ICD-10-CM | POA: Diagnosis not present

## 2018-08-09 DIAGNOSIS — C61 Malignant neoplasm of prostate: Secondary | ICD-10-CM | POA: Diagnosis not present

## 2018-08-09 DIAGNOSIS — I2510 Atherosclerotic heart disease of native coronary artery without angina pectoris: Secondary | ICD-10-CM | POA: Insufficient documentation

## 2018-08-09 DIAGNOSIS — K219 Gastro-esophageal reflux disease without esophagitis: Secondary | ICD-10-CM | POA: Diagnosis not present

## 2018-08-09 DIAGNOSIS — Z79899 Other long term (current) drug therapy: Secondary | ICD-10-CM | POA: Diagnosis not present

## 2018-08-09 DIAGNOSIS — Z888 Allergy status to other drugs, medicaments and biological substances status: Secondary | ICD-10-CM | POA: Insufficient documentation

## 2018-08-09 DIAGNOSIS — J849 Interstitial pulmonary disease, unspecified: Secondary | ICD-10-CM | POA: Diagnosis not present

## 2018-08-09 DIAGNOSIS — Z8249 Family history of ischemic heart disease and other diseases of the circulatory system: Secondary | ICD-10-CM | POA: Diagnosis not present

## 2018-08-09 DIAGNOSIS — R9431 Abnormal electrocardiogram [ECG] [EKG]: Secondary | ICD-10-CM | POA: Insufficient documentation

## 2018-08-09 DIAGNOSIS — E785 Hyperlipidemia, unspecified: Secondary | ICD-10-CM | POA: Diagnosis not present

## 2018-08-09 NOTE — Progress Notes (Signed)
Primary Care Physician: Alroy Dust, L.Marlou Sa, MD Referring Physician: Dr. Rayann Heman, [facility] f/u Primary Cardiologist: Dr Eleonore Chiquito is a 85 y.o. male with a h/o persistent afib symptomatic with fatigue and shortness of breath, CAD (last intervention BMS to Diag 2011, prior PTCA to LCx 1996, BMS to RCA 2009), HTN, HLD, OSA, CBP/spinal stenosis who presents for follow up in the [facility] for overdue surveillance of Tikosyn. Last visit was with Tommye Standard, PA one year ago. At that time, he was in rhythm, but the visit with Dr. Rayann Heman the time before, he was in afib and Dr. Rayann Heman stated that if it persisted, the he would opt for rate control going forward. He is not aware that he is out of rhythm,  rate controlled. Qtc is prolonged as well. He has not seen [name] as well for almost 2 years.He has had multiple visits with Pulmonary for treatment of his ILD. He has aged quite a bit since I saw him last. He is in a w/c and on supplemental O2.  F/u 2/27, now off tikosyn for failing to keep pt in rhythm. He is now in at afib at 117 bpm. He is asymptomatic but would like to see him better rate controlled.  F/u in afib clinic 3/5. He is better rate controlled with the cahnges last week. He will remain off tikosyn as it was  not keeping pt in rhythm. HE is be  rate controlled afib going forward. He is not symptomatic.   Today, he denies symptoms of  shortness of breath, orthopnea, PND, lower extremity edema, dizziness, presyncope, syncope, or neurologic sequela.  Positive for shortness of breath.The patient is tolerating medications without difficulties and is otherwise without complaint today.   Past Medical History:  Diagnosis Date  . Anxiety   . CAD S/P percutaneous coronary angioplasty 1996, 2009, 01/2010   PTCA of L Cx 1996; BMS-RCA [facility]) 2009; Staged PCI RCA (70% ISR) --> 3.0 mm x 23 mm BMS, staged PCI Diag - 2.0 mm x 12 mm Mini Vision BMS (2.25 mm)  .  Cataracts, bilateral    immature  . Chronic back pain    scoliosis--pt states unable to have surgery bc of age  . GERD (gastroesophageal reflux disease)    takes Nexium daily  . History of colon polyps   . Hyperlipidemia   . Hypertension   . Myocardial infarction [facility]) 1996, 2009   x 2;last time was about 8-67yrs ago   . Obesity (BMI 30.0-34.9)   . OSA (obstructive sleep apnea)   . Osteoarthritis of both knees   . Peripheral edema   . Persistent atrial fibrillation   . Prostate cancer ([place]) 2005   seed implant   Past Surgical History:  Procedure Laterality Date  . CARDIOVERSION N/A 06/12/2015   Procedure: CARDIOVERSION;  Surgeon: Sanda Klein, MD;  Location: [facility];  Service: Cardiovascular;  Laterality: N/A;  . CARDIOVERSION N/A 08/02/2015   Procedure: CARDIOVERSION;  Surgeon: [name], MD;  Location: [facility];  Service: Cardiovascular;  Laterality: N/A;  . CHOLECYSTECTOMY    . COLONOSCOPY    . CORONARY ANGIOPLASTY WITH STENT PLACEMENT  1996, 2009, August 2011   PTCA of L Cx 1996; BMS-RCA [facility]) 2009; Staged PCI RCA (70% ISR) --> 3.0 mm x 23 mm BMS, staged PCI Diag - 2.0 mm x 12 mm Mini Vision BMS (2.25 mm)  . cyst removed  in college  . NEUROPLASTY / TRANSPOSITION MEDIAN NERVE  AT CARPAL TUNNEL BILATERAL    . NM MYOVIEW LTD  November 2013   EF 53%; fixed mid inferior-inferolateral defect, infarct with no ischemia.  [name] NM MYOVIEW LTD  11/24/2010   ejection fraction 58%,left ventricle is normal size ,no evidence of inducible myocardial ischemia  . right knee arthrsoscopy  29yrs ago  . right trigger finger    . seeds placed into prostate   2005  . TEE WITHOUT CARDIOVERSION N/A 06/12/2015   Procedure: TRANSESOPHAGEAL ECHOCARDIOGRAM (TEE);  Surgeon: Sanda Klein, MD;  Location: [facility];  Service: Cardiovascular: EF 55-60%.No LA or LAA thrombus. Normal Peal PAP ~33 mmHg.  [name] TOTAL KNEE ARTHROPLASTY  05/09/2012   Procedure: TOTAL KNEE ARTHROPLASTY;  Surgeon: Kerin Salen,  MD;  Location: [facility];  Service: Orthopedics;  Laterality: Right;  . TRANSTHORACIC ECHOCARDIOGRAM  06/2015   EF 60-65%. Mild LA dilation. Normal PA pressures: 32 mmHg. Aortic sclerosis but no stenosis.  [name] wisdom teeth extraccted      Current Outpatient Medications  Medication Sig Dispense Refill  . acetaminophen (TYLENOL) 500 MG tablet Take 1,000 mg by mouth daily as needed (pain).    [name] ALPRAZolam (XANAX) 0.25 MG tablet Take 0.25 mg by mouth as needed for anxiety.    [name] atorvastatin (LIPITOR) 40 MG tablet TAKE 1 TABLET DAILY AT BEDTIME 90 tablet 1  . Cholecalciferol (VITAMIN D3) 1000 units CAPS Take 1 capsule by mouth daily.     [name] diltiazem (CARDIZEM CD) 180 MG 24 hr capsule Take 1 capsule (180 mg total) by mouth daily. 30 capsule 3  . ELIQUIS 5 MG TABS tablet TAKE 1 TABLET TWICE A DAY (SCHEDULE FOLLOW UP WITH CARDIOLOGIST PRIOR TO NEXT REFILL REQUEST) 180 tablet 4  . esomeprazole (NEXIUM) 40 MG capsule TAKE 40 mg CAPSULE DAILY 90 capsule 1  . furosemide (LASIX) 20 MG tablet Take 1 tablet (20 mg total) by mouth daily as needed. Call for appointment 90 tablet 0  . hydrocortisone cream 0.5 % Apply 1 application topically daily. Use as directed    . losartan (COZAAR) 100 MG tablet Take 0.5 tablets (50 mg total) by mouth daily. Please schedule appointment for refills. 90 tablet 0  . magnesium hydroxide (MILK OF MAGNESIA) 400 MG/5ML suspension Take 30 mLs by mouth daily as needed for mild constipation.    . magnesium oxide (MAG-OX) 400 MG tablet Take 1 tablet (400 mg total) by mouth 2 (two) times daily. 60 tablet 6  . mirabegron ER (MYRBETRIQ) 25 MG TB24 tablet Take 25 mg by mouth daily.     [name] NITROSTAT 0.4 MG SL tablet USE AS NEEDED 25 tablet 3  . Polyethylene Glycol 3350 (MIRALAX PO) Take 17 g by mouth daily as needed (Constipation).     . potassium chloride SA (K-DUR,KLOR-CON) 20 MEQ tablet Take 1 tablet (20 mEq total) by mouth daily. 30 tablet 0  . tamsulosin (FLOMAX) 0.4 MG CAPS capsule Take  0.4 mg by mouth at bedtime.      No current facility-administered medications for this encounter.     Allergies  Allergen Reactions  . Crab [Shellfish Allergy] Rash  . Hydrocodone Hives  . Adhesive [Tape] Rash  . Oxycodone Rash    Social History   Socioeconomic History  . Marital status: Married    Spouse name: Not on file  . Number of children: Not on file  . Years of education: Not on file  . Highest education level: Not on file  Occupational History  . Occupation: Retired  Social Needs  . Financial resource strain: Not on file  . Food insecurity:    Worry: Not on file    Inability: Not on file  . Transportation needs:    Medical: Not on file    Non-medical: Not on file  Tobacco Use  . Smoking status: Former Smoker    Types: Pipe    Last attempt to quit: 03/12/1984    Years since quitting: 34.4  . Smokeless tobacco: Never Used  . Tobacco comment: quit smoking pipe about 64yrs ago  Substance and Sexual Activity  . Alcohol use: Yes    Comment: glass of wine daily  . Drug use: No  . Sexual activity: Not Currently  Lifestyle  . Physical activity:    Days per week: Not on file    Minutes per session: Not on file  . Stress: Not on file  Relationships  . Social connections:    Talks on phone: Not on file    Gets together: Not on file    Attends religious service: Not on file    Active member of club or organization: Not on file    Attends meetings of clubs or organizations: Not on file    Relationship status: Not on file  . Intimate partner violence:    Fear of current or ex partner: Not on file    Emotionally abused: Not on file    Physically abused: Not on file    Forced sexual activity: Not on file  Other Topics Concern  . Not on file  Social History Narrative   He is a married father of 2, grandfather of 2. He exercises at least 3 to 4 times a week but does something 6 to 7 days a week. He does not smoke. Drinks occasional alcohol.     Family History    Problem Relation Age of Onset  . Cancer Mother 41       abdomen died in 83s  . Heart attack Father 64       died in 58s    ROS- All systems are reviewed and negative except as per the HPI above  Physical Exam: Vitals:   08/09/18 1515  BP: 108/70  Pulse: (!) 107  Weight: 105.2 kg  Height: 5\' 10"  (1.778 m)    GEN- The patient is well appearing obese male, alert and oriented x 3 today.   HEENT-head normocephalic, atraumatic, sclera clear, conjunctiva pink, hearing intact, trachea midline. Lungs- Clear to ausculation bilaterally, normal work of breathing Heart- irregular rate and rhythm, no murmurs, rubs or gallops  GI- soft, NT, ND, + BS Extremities- no clubbing, cyanosis, or edema MS- no significant deformity or atrophy Skin- no rash or lesion Psych- euthymic mood, full affect Neuro- strength and sensation are intact   EKG -atrial fib at 107 bpm, qrs int 86 ms, qtc 449ms    Epic records reviewed    Assessment and Plan: 1. Persistent atrial fibrillation/flutter Tikosyn stopped on last visit as he was out of rhythm and was unaware Will rate control Continune cardizem  180 mg daily for better rate control Continue Eliquis 5 mg BID  2. OSA Compliant with CPAP therapy.  3. HTN Continue 1/2 of 100 mg, dose lowered to get the extra cardizem on board  4. CAD No anginal symptoms  5. Interstitial lung disease Followed by [facility]. A Avoid amiodarone     He  has not seen [name] in one year Will request f/u in 3 months  Geroge Baseman Nandika Stetzer, [facility] [address] [place], [place] [address] [phone]

## 2018-08-10 DIAGNOSIS — I509 Heart failure, unspecified: Secondary | ICD-10-CM | POA: Diagnosis not present

## 2018-08-10 DIAGNOSIS — R2681 Unsteadiness on feet: Secondary | ICD-10-CM | POA: Diagnosis not present

## 2018-08-10 DIAGNOSIS — M6281 Muscle weakness (generalized): Secondary | ICD-10-CM | POA: Diagnosis not present

## 2018-08-10 DIAGNOSIS — M150 Primary generalized (osteo)arthritis: Secondary | ICD-10-CM | POA: Diagnosis not present

## 2018-08-14 DIAGNOSIS — M150 Primary generalized (osteo)arthritis: Secondary | ICD-10-CM | POA: Diagnosis not present

## 2018-08-14 DIAGNOSIS — M6281 Muscle weakness (generalized): Secondary | ICD-10-CM | POA: Diagnosis not present

## 2018-08-14 DIAGNOSIS — I509 Heart failure, unspecified: Secondary | ICD-10-CM | POA: Diagnosis not present

## 2018-08-14 DIAGNOSIS — R2681 Unsteadiness on feet: Secondary | ICD-10-CM | POA: Diagnosis not present

## 2018-08-21 DIAGNOSIS — M6281 Muscle weakness (generalized): Secondary | ICD-10-CM | POA: Diagnosis not present

## 2018-08-21 DIAGNOSIS — R2681 Unsteadiness on feet: Secondary | ICD-10-CM | POA: Diagnosis not present

## 2018-08-21 DIAGNOSIS — I509 Heart failure, unspecified: Secondary | ICD-10-CM | POA: Diagnosis not present

## 2018-08-21 DIAGNOSIS — M150 Primary generalized (osteo)arthritis: Secondary | ICD-10-CM | POA: Diagnosis not present

## 2018-08-24 DIAGNOSIS — I509 Heart failure, unspecified: Secondary | ICD-10-CM | POA: Diagnosis not present

## 2018-08-24 DIAGNOSIS — R2681 Unsteadiness on feet: Secondary | ICD-10-CM | POA: Diagnosis not present

## 2018-08-24 DIAGNOSIS — M150 Primary generalized (osteo)arthritis: Secondary | ICD-10-CM | POA: Diagnosis not present

## 2018-08-24 DIAGNOSIS — M6281 Muscle weakness (generalized): Secondary | ICD-10-CM | POA: Diagnosis not present

## 2018-08-26 ENCOUNTER — Other Ambulatory Visit: Payer: Self-pay | Admitting: Cardiology

## 2018-08-28 DIAGNOSIS — I509 Heart failure, unspecified: Secondary | ICD-10-CM | POA: Diagnosis not present

## 2018-08-28 DIAGNOSIS — M6281 Muscle weakness (generalized): Secondary | ICD-10-CM | POA: Diagnosis not present

## 2018-08-28 DIAGNOSIS — M150 Primary generalized (osteo)arthritis: Secondary | ICD-10-CM | POA: Diagnosis not present

## 2018-08-28 DIAGNOSIS — R2681 Unsteadiness on feet: Secondary | ICD-10-CM | POA: Diagnosis not present

## 2018-08-31 DIAGNOSIS — R2681 Unsteadiness on feet: Secondary | ICD-10-CM | POA: Diagnosis not present

## 2018-08-31 DIAGNOSIS — M150 Primary generalized (osteo)arthritis: Secondary | ICD-10-CM | POA: Diagnosis not present

## 2018-08-31 DIAGNOSIS — M6281 Muscle weakness (generalized): Secondary | ICD-10-CM | POA: Diagnosis not present

## 2018-08-31 DIAGNOSIS — I509 Heart failure, unspecified: Secondary | ICD-10-CM | POA: Diagnosis not present

## 2018-09-02 ENCOUNTER — Other Ambulatory Visit: Payer: Self-pay | Admitting: Cardiology

## 2018-09-02 DIAGNOSIS — I4819 Other persistent atrial fibrillation: Secondary | ICD-10-CM

## 2018-09-04 DIAGNOSIS — M150 Primary generalized (osteo)arthritis: Secondary | ICD-10-CM | POA: Diagnosis not present

## 2018-09-04 DIAGNOSIS — R2681 Unsteadiness on feet: Secondary | ICD-10-CM | POA: Diagnosis not present

## 2018-09-04 DIAGNOSIS — M6281 Muscle weakness (generalized): Secondary | ICD-10-CM | POA: Diagnosis not present

## 2018-09-04 DIAGNOSIS — I509 Heart failure, unspecified: Secondary | ICD-10-CM | POA: Diagnosis not present

## 2018-09-06 ENCOUNTER — Telehealth: Payer: Self-pay | Admitting: Pulmonary Disease

## 2018-09-06 NOTE — Telephone Encounter (Signed)
Called and spoke with pt who stated he was confused wanting to know why his OV was cancelled with RA and why he had been told to schedule televisit. I further explained to pt the reason why his appt with RA had to be cancelled and stated to him that we will place a recall and call him once we can schedule him for an appt with RA. Also stated to pt if he began to have worsening symptoms with his breathing or if he developed any new symptoms of cough, fever, etc to call our office immediately so we can see how we could help him out and stated that at that time we might need to schedule a televisit with him.  Pt expressed understanding and stated he would call us if needed. Nothing further needed.

## 2018-09-07 DIAGNOSIS — M150 Primary generalized (osteo)arthritis: Secondary | ICD-10-CM | POA: Diagnosis not present

## 2018-09-07 DIAGNOSIS — R531 Weakness: Secondary | ICD-10-CM | POA: Diagnosis not present

## 2018-09-07 DIAGNOSIS — I509 Heart failure, unspecified: Secondary | ICD-10-CM | POA: Diagnosis not present

## 2018-09-07 DIAGNOSIS — M6281 Muscle weakness (generalized): Secondary | ICD-10-CM | POA: Diagnosis not present

## 2018-09-07 DIAGNOSIS — Z7389 Other problems related to life management difficulty: Secondary | ICD-10-CM | POA: Diagnosis not present

## 2018-09-07 DIAGNOSIS — N3941 Urge incontinence: Secondary | ICD-10-CM | POA: Diagnosis not present

## 2018-09-11 DIAGNOSIS — Z7389 Other problems related to life management difficulty: Secondary | ICD-10-CM | POA: Diagnosis not present

## 2018-09-11 DIAGNOSIS — M150 Primary generalized (osteo)arthritis: Secondary | ICD-10-CM | POA: Diagnosis not present

## 2018-09-11 DIAGNOSIS — I509 Heart failure, unspecified: Secondary | ICD-10-CM | POA: Diagnosis not present

## 2018-09-11 DIAGNOSIS — M6281 Muscle weakness (generalized): Secondary | ICD-10-CM | POA: Diagnosis not present

## 2018-09-11 DIAGNOSIS — N3941 Urge incontinence: Secondary | ICD-10-CM | POA: Diagnosis not present

## 2018-09-11 DIAGNOSIS — R531 Weakness: Secondary | ICD-10-CM | POA: Diagnosis not present

## 2018-09-22 ENCOUNTER — Other Ambulatory Visit: Payer: Self-pay | Admitting: Cardiology

## 2018-09-24 NOTE — Telephone Encounter (Signed)
nexium refilled

## 2018-09-26 ENCOUNTER — Ambulatory Visit: Payer: Medicare Other | Admitting: Pulmonary Disease

## 2018-10-09 DIAGNOSIS — Q6689 Other  specified congenital deformities of feet: Secondary | ICD-10-CM | POA: Diagnosis not present

## 2018-10-09 DIAGNOSIS — B351 Tinea unguium: Secondary | ICD-10-CM | POA: Diagnosis not present

## 2018-10-09 DIAGNOSIS — M79671 Pain in right foot: Secondary | ICD-10-CM | POA: Diagnosis not present

## 2018-10-09 DIAGNOSIS — M79672 Pain in left foot: Secondary | ICD-10-CM | POA: Diagnosis not present

## 2018-10-25 DIAGNOSIS — Z9181 History of falling: Secondary | ICD-10-CM | POA: Diagnosis not present

## 2018-10-25 DIAGNOSIS — M6281 Muscle weakness (generalized): Secondary | ICD-10-CM | POA: Diagnosis not present

## 2018-10-25 DIAGNOSIS — M25562 Pain in left knee: Secondary | ICD-10-CM | POA: Diagnosis not present

## 2018-10-25 DIAGNOSIS — I509 Heart failure, unspecified: Secondary | ICD-10-CM | POA: Diagnosis not present

## 2018-10-25 DIAGNOSIS — Z7389 Other problems related to life management difficulty: Secondary | ICD-10-CM | POA: Diagnosis not present

## 2018-10-25 DIAGNOSIS — R279 Unspecified lack of coordination: Secondary | ICD-10-CM | POA: Diagnosis not present

## 2018-10-25 DIAGNOSIS — R2681 Unsteadiness on feet: Secondary | ICD-10-CM | POA: Diagnosis not present

## 2018-10-30 DIAGNOSIS — R279 Unspecified lack of coordination: Secondary | ICD-10-CM | POA: Diagnosis not present

## 2018-10-30 DIAGNOSIS — R2681 Unsteadiness on feet: Secondary | ICD-10-CM | POA: Diagnosis not present

## 2018-10-30 DIAGNOSIS — M6281 Muscle weakness (generalized): Secondary | ICD-10-CM | POA: Diagnosis not present

## 2018-10-30 DIAGNOSIS — M25562 Pain in left knee: Secondary | ICD-10-CM | POA: Diagnosis not present

## 2018-10-30 DIAGNOSIS — I509 Heart failure, unspecified: Secondary | ICD-10-CM | POA: Diagnosis not present

## 2018-10-30 DIAGNOSIS — Z9181 History of falling: Secondary | ICD-10-CM | POA: Diagnosis not present

## 2018-10-31 DIAGNOSIS — M6281 Muscle weakness (generalized): Secondary | ICD-10-CM | POA: Diagnosis not present

## 2018-10-31 DIAGNOSIS — R279 Unspecified lack of coordination: Secondary | ICD-10-CM | POA: Diagnosis not present

## 2018-10-31 DIAGNOSIS — Z9181 History of falling: Secondary | ICD-10-CM | POA: Diagnosis not present

## 2018-10-31 DIAGNOSIS — R2681 Unsteadiness on feet: Secondary | ICD-10-CM | POA: Diagnosis not present

## 2018-10-31 DIAGNOSIS — I509 Heart failure, unspecified: Secondary | ICD-10-CM | POA: Diagnosis not present

## 2018-10-31 DIAGNOSIS — M25562 Pain in left knee: Secondary | ICD-10-CM | POA: Diagnosis not present

## 2018-11-01 DIAGNOSIS — M6281 Muscle weakness (generalized): Secondary | ICD-10-CM | POA: Diagnosis not present

## 2018-11-01 DIAGNOSIS — M25562 Pain in left knee: Secondary | ICD-10-CM | POA: Diagnosis not present

## 2018-11-01 DIAGNOSIS — R2681 Unsteadiness on feet: Secondary | ICD-10-CM | POA: Diagnosis not present

## 2018-11-01 DIAGNOSIS — I509 Heart failure, unspecified: Secondary | ICD-10-CM | POA: Diagnosis not present

## 2018-11-01 DIAGNOSIS — R279 Unspecified lack of coordination: Secondary | ICD-10-CM | POA: Diagnosis not present

## 2018-11-01 DIAGNOSIS — Z9181 History of falling: Secondary | ICD-10-CM | POA: Diagnosis not present

## 2018-11-06 DIAGNOSIS — Z9181 History of falling: Secondary | ICD-10-CM | POA: Diagnosis not present

## 2018-11-06 DIAGNOSIS — Z7389 Other problems related to life management difficulty: Secondary | ICD-10-CM | POA: Diagnosis not present

## 2018-11-06 DIAGNOSIS — R2681 Unsteadiness on feet: Secondary | ICD-10-CM | POA: Diagnosis not present

## 2018-11-06 DIAGNOSIS — M25562 Pain in left knee: Secondary | ICD-10-CM | POA: Diagnosis not present

## 2018-11-06 DIAGNOSIS — M6281 Muscle weakness (generalized): Secondary | ICD-10-CM | POA: Diagnosis not present

## 2018-11-06 DIAGNOSIS — I509 Heart failure, unspecified: Secondary | ICD-10-CM | POA: Diagnosis not present

## 2018-11-07 DIAGNOSIS — M25562 Pain in left knee: Secondary | ICD-10-CM | POA: Diagnosis not present

## 2018-11-07 DIAGNOSIS — I509 Heart failure, unspecified: Secondary | ICD-10-CM | POA: Diagnosis not present

## 2018-11-07 DIAGNOSIS — Z9181 History of falling: Secondary | ICD-10-CM | POA: Diagnosis not present

## 2018-11-07 DIAGNOSIS — R2681 Unsteadiness on feet: Secondary | ICD-10-CM | POA: Diagnosis not present

## 2018-11-07 DIAGNOSIS — M6281 Muscle weakness (generalized): Secondary | ICD-10-CM | POA: Diagnosis not present

## 2018-11-07 DIAGNOSIS — Z7389 Other problems related to life management difficulty: Secondary | ICD-10-CM | POA: Diagnosis not present

## 2018-11-08 DIAGNOSIS — Z9181 History of falling: Secondary | ICD-10-CM | POA: Diagnosis not present

## 2018-11-08 DIAGNOSIS — M6281 Muscle weakness (generalized): Secondary | ICD-10-CM | POA: Diagnosis not present

## 2018-11-08 DIAGNOSIS — R2681 Unsteadiness on feet: Secondary | ICD-10-CM | POA: Diagnosis not present

## 2018-11-08 DIAGNOSIS — I509 Heart failure, unspecified: Secondary | ICD-10-CM | POA: Diagnosis not present

## 2018-11-08 DIAGNOSIS — Z7389 Other problems related to life management difficulty: Secondary | ICD-10-CM | POA: Diagnosis not present

## 2018-11-08 DIAGNOSIS — M25562 Pain in left knee: Secondary | ICD-10-CM | POA: Diagnosis not present

## 2018-11-09 ENCOUNTER — Telehealth: Payer: Self-pay | Admitting: Pulmonary Disease

## 2018-11-09 NOTE — Telephone Encounter (Signed)
Attempted to return call to Julie/[employer].  No answer. Left voicemail to call back.

## 2018-11-12 NOTE — Telephone Encounter (Signed)
Attempted to call Almyra Free with [employer] but unable to reach. Left message for Almyra Free to return call.

## 2018-11-13 ENCOUNTER — Telehealth: Payer: Self-pay | Admitting: *Deleted

## 2018-11-13 DIAGNOSIS — M6281 Muscle weakness (generalized): Secondary | ICD-10-CM | POA: Diagnosis not present

## 2018-11-13 DIAGNOSIS — I509 Heart failure, unspecified: Secondary | ICD-10-CM | POA: Diagnosis not present

## 2018-11-13 DIAGNOSIS — R2681 Unsteadiness on feet: Secondary | ICD-10-CM | POA: Diagnosis not present

## 2018-11-13 DIAGNOSIS — M25562 Pain in left knee: Secondary | ICD-10-CM | POA: Diagnosis not present

## 2018-11-13 DIAGNOSIS — Z7389 Other problems related to life management difficulty: Secondary | ICD-10-CM | POA: Diagnosis not present

## 2018-11-13 DIAGNOSIS — Z9181 History of falling: Secondary | ICD-10-CM | POA: Diagnosis not present

## 2018-11-13 NOTE — Telephone Encounter (Signed)
   Primary Cardiologist: [place]   Pt contacted.  History and symptoms reviewed.  Pt will f[facility]u with [facility] provider as scheduled.  Pt. advised that we are restricting visitors at this time and request that only patients present for check-in prior to their appointment.  All other visitors should remain in their car.  If necessary, only one visitor may come with the patient, into the building. For everyone's safety, all patients and visitor entering our practice area should expect to be screened again prior to entering our waiting area.  Raiford Simmonds, RN  11/13/2018 1:49 PM       COVID-19 Pre-Screening Questions:  . In the past 7 to 10 days have you had a cough,  shortness of breath, headache, congestion, fever (100 or greater) body aches, chills, sore throat, or sudden loss of taste or sense of smell? no . Have you been around anyone with known Covid 19.no . Have you been around anyone who is awaiting Covid 19 test results in the past 7 to 10 days?no Have you been around anyone who has been exposed to Covid 19, or has mentioned symptoms of Covid 19 within the past 7 to 10 days?no If you have any concerns/questions about symptoms patients report during screening (either on the phone or at threshold). Contact the provider seeing the patient or DOD for further guidance.  If neither are available contact a member of the leadership team.

## 2018-11-13 NOTE — Telephone Encounter (Signed)
Spoke with Almyra Free with [employer]. States that she is calling about a document that her and Rodena Piety had been working on. Message needs to be handled by Rodena Piety.

## 2018-11-13 NOTE — Telephone Encounter (Signed)
I just spoke with Jerry [name] at [facility] and this order was sent through [employer]. She is going to void the other order and put in the correct order to be signed. I don't know where you put a note to me but it will be taken care of once she puts in the new order

## 2018-11-14 ENCOUNTER — Ambulatory Visit (INDEPENDENT_AMBULATORY_CARE_PROVIDER_SITE_OTHER): Payer: Medicare Other | Admitting: Cardiology

## 2018-11-14 ENCOUNTER — Other Ambulatory Visit: Payer: Self-pay

## 2018-11-14 ENCOUNTER — Other Ambulatory Visit: Payer: Self-pay | Admitting: Cardiology

## 2018-11-14 ENCOUNTER — Encounter: Payer: Self-pay | Admitting: Cardiology

## 2018-11-14 VITALS — BP 136/65 | HR 84 | Temp 97.3°F | Ht 70.0 in | Wt 214.6 lb

## 2018-11-14 DIAGNOSIS — I2510 Atherosclerotic heart disease of native coronary artery without angina pectoris: Secondary | ICD-10-CM | POA: Diagnosis not present

## 2018-11-14 DIAGNOSIS — R0609 Other forms of dyspnea: Secondary | ICD-10-CM | POA: Diagnosis not present

## 2018-11-14 DIAGNOSIS — I4819 Other persistent atrial fibrillation: Secondary | ICD-10-CM | POA: Diagnosis not present

## 2018-11-14 DIAGNOSIS — E785 Hyperlipidemia, unspecified: Secondary | ICD-10-CM | POA: Diagnosis not present

## 2018-11-14 DIAGNOSIS — I10 Essential (primary) hypertension: Secondary | ICD-10-CM

## 2018-11-14 DIAGNOSIS — R600 Localized edema: Secondary | ICD-10-CM | POA: Diagnosis not present

## 2018-11-14 DIAGNOSIS — I480 Paroxysmal atrial fibrillation: Secondary | ICD-10-CM

## 2018-11-14 DIAGNOSIS — I2111 ST elevation (STEMI) myocardial infarction involving right coronary artery: Secondary | ICD-10-CM

## 2018-11-14 DIAGNOSIS — R2681 Unsteadiness on feet: Secondary | ICD-10-CM | POA: Diagnosis not present

## 2018-11-14 DIAGNOSIS — I509 Heart failure, unspecified: Secondary | ICD-10-CM | POA: Diagnosis not present

## 2018-11-14 DIAGNOSIS — M25562 Pain in left knee: Secondary | ICD-10-CM | POA: Diagnosis not present

## 2018-11-14 DIAGNOSIS — Z7389 Other problems related to life management difficulty: Secondary | ICD-10-CM | POA: Diagnosis not present

## 2018-11-14 DIAGNOSIS — Z9861 Coronary angioplasty status: Secondary | ICD-10-CM | POA: Diagnosis not present

## 2018-11-14 DIAGNOSIS — M6281 Muscle weakness (generalized): Secondary | ICD-10-CM | POA: Diagnosis not present

## 2018-11-14 DIAGNOSIS — Z9181 History of falling: Secondary | ICD-10-CM | POA: Diagnosis not present

## 2018-11-14 MED ORDER — DILTIAZEM HCL ER COATED BEADS 240 MG PO CP24: 240.0000 mg | ORAL_CAPSULE | Freq: Every day | ORAL | 6 refills | Status: DC

## 2018-11-14 NOTE — Progress Notes (Signed)
PCP: Alroy Dust, L.Marlou Sa, MD  Clinic Note: Chief Complaint  Patient presents with  . [name]    Relatively stable  . Coronary Artery Disease  . Atrial Fibrillation    rate controlled (failed rhythm control after DCCV - no longer on Tikosyn).  DOAC = eliquis    HPI: Jerry [name] is a 85 y.o. male with a PMH below who presents today for routine [name] for CAD and A. fib.  Cardiac History:   CAD/ previous MI --> s/p PTCA of LCx 1996; BMS-RCA [facility]) 2009; Staged PCI RCA (70% ISR) --> 3.0 mm x 23 mm BMS, staged PCI Diag - 2.0 mm x 12 mm Mini Vision BMS (2.25 mm) in 2011,   He had a Myoview in 04/2012: fixed mid inferior-inferolateral defect/ infarct with no ischemia. EF was 53%.   Echocardiogram 09/2012: normal EF of 55-60% w/ G2DD, aortic sclerosis w/o stenosis and mild PAH. - Most recent Echo in January 2016 now shows normal EF 60-65% with normal PA pressures.   In January to February 2017 he was diagnosed with atrial fibrillation --> followed by a A. fib clinic --> now no longer on Tikosyn -- Plan Rate Control (per Afib Clinic - Dr. Rayann Heman).   History also notes significant interstitial interstitial lung disease/chronic respiratory failure with hypoxia (on home oxygen),  HTN, HLD, OSA, CBP/spinal stenosis & he is quite debilitated at baseline -- significant upper thoracic & C-spine Kyphosis.  He is also starting to have memory issues (gets lost easily).  Chronic LE edema - sleeps on a recliner for comfort.  I have actually not seen him since October 2018 - he seems to have aged quite a bit since that time.  At that time, he was noting better energy stop beta-blocker, and not really noticing any significant palpitation issues.  Jerry [name] was last seen on in A. fib clinic by Roderic Palau, NP on August 09, 2018 --> now off Tikosyn, plan for rate control.  Most recent visit he was doing relatively well with no dyspnea or sensation of A. Fib.  Is on diltiazem 180 mg daily  along with Eliquis.  Recent Hospitalizations: None  Studies Personally Reviewed - (if available, images/films reviewed: From Epic Chart or Care Everywhere)  none  Interval History: Jerry [name] doing about as well as he would expect.  He is as active as he wants to be.  Has not had any issues with the code restrictions, indicating that he enjoys being distant.  He gets out when he can, and tries to exercise most days of the week, but does not do that much.  He is very limited now because of his spinal stenosis, kyphosis and lung disease. He really does not notice any irregular heartbeats palpitations.  He does get worn out when easily walking, but there is plenty reason to contribute this to.  He does not really feel his heart rate going that fast unless he is having a coughing spell.  No real sensation of palpitations or tachycardia. His edema is pretty well controlled, but back in the fall had higher dose of Lasix.  Now is really taking it as needed.  No real PND orthopnea, although he still sleeps in recliner.  No chest pain or pressure with rest or exertion.  He does have exertional dyspnea, that is chronic.  Not at rest. No bleeding issues with his Eliquis -- No melena, hematochezia, hematuria, or epstaxis.[name]  He does note easy bruising but  stable.  No TIA or amaurosis fugax.  No syncope or near syncope.  No claudication.  ROS: A comprehensive was performed. Review of Systems  Constitutional: Positive for malaise/fatigue (deconditioned).  HENT: Negative for nosebleeds.   Respiratory: Positive for shortness of breath (chroncic - no change; worse with exertion). Negative for cough and wheezing.   Cardiovascular: Positive for leg swelling (stable).  Gastrointestinal: Positive for constipation. Negative for blood in stool.  Genitourinary: Negative for hematuria.  Musculoskeletal: Positive for back pain, joint pain (knees) and neck pain. Negative for falls.   Neurological: Positive for tingling (-He still has radicular pain down the legs from spinal stenosis.). Negative for dizziness, focal weakness, weakness and headaches.       He has pretty poor balance with leg weakness.  He uses a cane and/or walker with slow steady gait.  Psychiatric/Behavioral: Positive for memory loss (gets lost easily). Negative for depression. The patient is not nervous/anxious and does not have insomnia (gets ~3 - 3 1/2 hr sleep with CPAP /night before getting up to urinate).        He says the COVID-19 isolation goes along well with his "introverted nature" --seems to have quite a bit of anhedonia  All other systems reviewed and are negative.   The patient does not have symptoms concerning for COVID-19 infection (fever, chills, cough, or new shortness of breath).  The patient is practicing social distancing.  Basically quarantined at his assisted living facility -[facility]   COVID-19 Education: The signs and symptoms of COVID-19 were discussed with the patient and how to seek care for testing (follow up with PCP or arrange E-visit).   The importance of social distancing was discussed today.   I have reviewed and (if needed) personally updated the patient's problem list, medications, allergies, past medical and surgical history, social and family history.   Past Medical History:  Diagnosis Date  . Anxiety   . CAD S/P percutaneous coronary angioplasty 1996, 2009, 01/2010   PTCA of L Cx 1996; BMS-RCA [facility]) 2009; Staged PCI RCA (70% ISR) --> 3.0 mm x 23 mm BMS, staged PCI Diag - 2.0 mm x 12 mm Mini Vision BMS (2.25 mm)  . Cataracts, bilateral    immature  . Chronic back pain    scoliosis--pt states unable to have surgery bc of age  . GERD (gastroesophageal reflux disease)    takes Nexium daily  . History of colon polyps   . Hyperlipidemia   . Hypertension   . Myocardial infarction [facility]) 1996, 2009   x 2;last time was about 8-26yrs ago   . Obesity (BMI  30.0-34.9)   . OSA (obstructive sleep apnea)   . Osteoarthritis of both knees   . Peripheral edema    Venous Stasis  . Persistent atrial fibrillation    Unable to maintaine NSR after DCCV with Tikosyn.  Plan = rate control w/diltiazem (beta blocker off b/c fatigue). CHA2DS2Vasc 4. DOAC - Eliquis.   . Prostate cancer ([place]) 2005   seed implant    Past Surgical History:  Procedure Laterality Date  . CARDIOVERSION N/A 06/12/2015   Procedure: CARDIOVERSION;  Surgeon: Sanda Klein, MD;  Location: [facility];  Service: Cardiovascular;  Laterality: N/A;  . CARDIOVERSION N/A 08/02/2015   Procedure: CARDIOVERSION;  Surgeon: [name], MD;  Location: [facility];  Service: Cardiovascular;  Laterality: N/A;  . CHOLECYSTECTOMY    . COLONOSCOPY    . [place], 2009, August 2011   PTCA  of L Cx 1996; BMS-RCA [facility]) 2009; Staged PCI RCA (70% ISR) --> 3.0 mm x 23 mm BMS, staged PCI Diag - 2.0 mm x 12 mm Mini Vision BMS (2.25 mm)  . cyst removed  in college  . NEUROPLASTY / TRANSPOSITION MEDIAN NERVE AT CARPAL TUNNEL BILATERAL    . NM MYOVIEW LTD  November 2013   EF 53%; fixed mid inferior-inferolateral defect, infarct with no ischemia.  . right knee arthrsoscopy  80yrs ago  . right trigger finger    . seeds placed into prostate   2005  . TEE WITHOUT CARDIOVERSION N/A 06/12/2015   Procedure: TRANSESOPHAGEAL ECHOCARDIOGRAM (TEE) - WITH CARDIOVERSION;  Surgeon: Sanda Klein, MD;  Location: [facility];  Service: Cardiovascular: EF 55-60%.No LA or LAA thrombus. Normal Peal PAP ~33 mmHg.  [name] TOTAL KNEE ARTHROPLASTY  05/09/2012   Procedure: TOTAL KNEE ARTHROPLASTY;  Surgeon: Kerin Salen, MD;  Location: [facility];  Service: Orthopedics;  Laterality: Right;  . TRANSTHORACIC ECHOCARDIOGRAM  06/2015   EF 60-65%. Mild LA dilation. Normal PA pressures: 32 mmHg. Aortic sclerosis but no stenosis.  [name] wisdom teeth extraccted      Current Meds  Medication Sig  . acetaminophen  (TYLENOL) 500 MG tablet Take 1,000 mg by mouth daily as needed (pain).  [name] ALPRAZolam (XANAX) 0.25 MG tablet Take 0.25 mg by mouth as needed for anxiety.  [name] atorvastatin (LIPITOR) 40 MG tablet TAKE 1 TABLET DAILY AT BEDTIME  . Cholecalciferol (VITAMIN D3) 1000 units CAPS Take 1 capsule by mouth daily.   [name] diltiazem (CARDIZEM CD) 240 MG 24 hr capsule Take 1 capsule (240 mg total) by mouth daily.  [name] ELIQUIS 5 MG TABS tablet TAKE 1 TABLET TWICE A DAY (SCHEDULE FOLLOW UP WITH CARDIOLOGIST PRIOR TO NEXT REFILL REQUEST)  . esomeprazole (NEXIUM) 40 MG capsule TAKE 1 CAPSULE DAILY  . furosemide (LASIX) 20 MG tablet TAKE 1 TABLET DAILY AS NEEDED (CALL FOR APPOINTMENT)  . hydrocortisone cream 0.5 % Apply 1 application topically daily. Use as directed  . losartan (COZAAR) 100 MG tablet TAKE 1 TABLET DAILY (SCHEDULE APPOINTMENT FOR REFILLS)  . magnesium hydroxide (MILK OF MAGNESIA) 400 MG/5ML suspension Take 30 mLs by mouth daily as needed for mild constipation.  . magnesium oxide (MAG-OX) 400 MG tablet Take 1 tablet (400 mg total) by mouth 2 (two) times daily.  . mirabegron ER (MYRBETRIQ) 25 MG TB24 tablet Take 25 mg by mouth daily.   [name] NITROSTAT 0.4 MG SL tablet USE AS NEEDED  . Polyethylene Glycol 3350 (MIRALAX PO) Take 17 g by mouth daily as needed (Constipation).   . potassium chloride SA (K-DUR,KLOR-CON) 20 MEQ tablet Take 1 tablet (20 mEq total) by mouth daily.  . tamsulosin (FLOMAX) 0.4 MG CAPS capsule Take 0.4 mg by mouth at bedtime.   . [DISCONTINUED] diltiazem (CARDIZEM CD) 180 MG 24 hr capsule Take 1 capsule (180 mg total) by mouth daily.  . [DISCONTINUED] diltiazem (CARDIZEM CD) 180 MG 24 hr capsule Take 1 capsule (180 mg total) by mouth daily.  . [DISCONTINUED] diltiazem (CARDIZEM CD) 180 MG 24 hr capsule TAKE 1 CAPSULE DAILY    Allergies  Allergen Reactions  . Crab [Shellfish Allergy] Rash  . Hydrocodone Hives  . Adhesive [Tape] Rash  . Oxycodone Rash    Social History   Tobacco Use   . Smoking status: Former Smoker    Types: Pipe    Quit date: 03/12/1984    Years since quitting: 34.7  . Smokeless tobacco: Never  Used  . Tobacco comment: quit smoking pipe about 75yrs ago  Substance Use Topics  . Alcohol use: Yes    Comment: glass of wine daily  . Drug use: No   Social History   Social History Narrative   He is a ? WIDOWED father of 2, grandfather of 2.   [place] @ [facility] [facility] [facility])   He does not smoke. Drinks occasional alcohol.     family history includes Cancer (age of onset: 60) in his mother; Heart attack (age of onset: 56) in his father.  Wt Readings from Last 3 Encounters:  11/14/18 214 lb 9.6 oz (97.3 kg)  08/09/18 232 lb (105.2 kg)  08/02/18 234 lb (106.1 kg)    PHYSICAL EXAM BP 136/65   Pulse 84   Temp (!) 97.3 F (36.3 C)   Wt 214 lb 9.6 oz (97.3 kg)   SpO2 95%   BMI 30.79 kg/m  Physical Exam  Constitutional: He is oriented to person, place, and time. No distress.  Chronically ill-appearing elderly gentleman.  Quite disheveled appearing.  Was worn out simply walking into the clinic room, using cane.  He is obese with significant C-spine and upper thoracic kyphosis.  HENT:  Head: Normocephalic and atraumatic.  Quite hard of hearing  Eyes: EOM are normal.  Neck: No hepatojugular reflux and no JVD present. Carotid bruit is not present.  Somewhat stiff neck with kyphosis.  Cardiovascular: Normal rate, S1 normal and S2 normal. An irregularly irregular rhythm present. PMI is not displaced (Unable to palpate due to body habitus). Exam reveals distant heart sounds and decreased pulses (Decreased pedal pulses.  Partly because of edema.). Exam reveals no gallop, no S4 and no friction rub.  No murmur (Cannot exclude soft 1/6 SEM at RUSB) heard. Pulmonary/Chest: Effort normal and breath sounds normal. No respiratory distress. He has no wheezes. He has no rales.  Abdominal: Soft. Bowel sounds are normal. He exhibits no  distension. There is no abdominal tenderness.  Obese  Musculoskeletal: Normal range of motion.        General: Deformity (Kyphosis) and edema (1-2+) present.  Neurological: He is alert and oriented to person, place, and time.  Psychiatric: He has a normal mood and affect. His behavior is normal. Judgment and thought content normal.  Vitals reviewed.   Adult ECG Report  Rate: 106 (was checked upon arrival to exam room - was 73 with VS) which;  Rhythm: sinus arrhythmia and PVC versus aberrantly conducted beats.  Nonspecific ST and T wave changes.;   Narrative Interpretation: Stable EKG  Other studies Reviewed: Additional studies/ records that were reviewed today include:  Recent Labs:  Followed by PCP  This patients CHA2DS2-VASc Score and unadjusted Ischemic Stroke Rate (% per year) is equal to 4.8 % stroke rate/year from a score of 4  Above score calculated as 1 point each if present [CHF, HTN, DM, Vascular=MI/PAD/Aortic Plaque, Age if 65-74, or Male] Above score calculated as 2 points each if present [Age > 75, or Stroke/TIA/TE]    ASSESSMENT / PLAN: Problem List Items Addressed This Visit    Persistent atrial fibrillation ([place]):  CHA2DS2-VASc Score 4; On Eliquis - Primary (Chronic)    Now pretty much persistent A. fib with plans for rate control only as he failed rhythm control. He does not seem to have as good control as we thought on current dose of diltiazem based on his EKG here today.  Will increase diltiazem 240 mg daily. Continue  Eliquis.      Relevant Medications   diltiazem (CARDIZEM CD) 240 MG 24 hr capsule   Other Relevant Orders   EKG 12-Lead (Completed)   Pedal edema    Well-controlled on current dose of Lasix.  Intermittently taking more doses, but sometimes not taking it daily. I talk about trying to wear support stockings to help.  He has difficulty putting them on.  Continue foot elevation.      Myocardial infarction; history of (Chronic)    Nonischemic  Myoview in 2013 with fixed infarct noted.  No significant heart failure symptoms and no recurrent angina symptoms.  Preserved EF. I suspect that his edema is probably related to venous stasis and not CHF. Not taking Lasix every day, but intermittently.      Relevant Medications   diltiazem (CARDIZEM CD) 240 MG 24 hr capsule   Other Relevant Orders   EKG 12-Lead (Completed)   Hyperlipidemia with target LDL less than 70 (Chronic)    On stable dose of Lipitor.  Monitored by PCP.  Labs not available      Relevant Medications   diltiazem (CARDIZEM CD) 240 MG 24 hr capsule   Exertional dyspnea and fatigue (Chronic)    Clearly is limited by his hips, spinal stenosis, and kyphosis along with ILD.  Very deconditioned.  He says he is as active as he wants to be.  He uses home oxygen and therefore is content.  No further testing.      Relevant Orders   EKG 12-Lead (Completed)   Essential hypertension (Chronic)    Blood pressure stable on current dose of ARB and diltiazem.  I think he can tolerate further titration of diltiazem dose up to 240 mg daily.      Relevant Medications   diltiazem (CARDIZEM CD) 240 MG 24 hr capsule   Other Relevant Orders   EKG 12-Lead (Completed)   CAD S/P percutaneous coronary angioplasty (Chronic)    Very distant history of PCI initially back in 1996 and then 2009 to a diagonal branch.  Has not had any further anginal symptoms. Last Myoview was nonischemic in 2013.  EF preserved by echo.  Not really having angina or heart failure symptoms. ->  Based on previous discussions with him, we would avoid any ischemic evaluations unless he were to have worsening symptoms.  He indicated that he probably would not be all that interested in invasive procedures.  With normal/preserved EF we have replaced beta-blocker with diltiazem after he had significant fatigue with beta-blocker. On stable dose of ARB (previously on Micardis, converted to losartan for financial issues). On  stable dose of statin, not on aspirin or Plavix because of Eliquis.      Relevant Medications   diltiazem (CARDIZEM CD) 240 MG 24 hr capsule   Other Relevant Orders   EKG 12-Lead (Completed)    Other Visit Diagnoses    Paroxysmal atrial fibrillation (HCC)       Relevant Medications   diltiazem (CARDIZEM CD) 240 MG 24 hr capsule   Other Relevant Orders   EKG 12-Lead (Completed)   Coronary artery disease involving native coronary artery of native heart without angina pectoris       Relevant Medications   diltiazem (CARDIZEM CD) 240 MG 24 hr capsule   Other Relevant Orders   EKG 12-Lead (Completed)       I spent a total of 25 minutes with the patient and chart review. >  50% of the time was spent in direct  patient consultation.   Current medicines are reviewed at length with the patient today.  (+/- concerns) n/a The following changes have been made:  see below  Patient Instructions  Medication Instructions:  FINISH the bottle of Diltiazem 180mg  THEN  START Diltiazem 240mg  Take 1 tablet once a day If you need a refill on your cardiac medications before your next appointment, please call your pharmacy.   Lab work: None  If you have labs (blood work) drawn today and your tests are completely normal, you will receive your results only by: [name] MyChart Message (if you have MyChart) OR . A paper copy in the mail If you have any lab test that is abnormal or we need to change your treatment, we will call you to review the results.  Testing/Procedures: None   [name]: 7 months.  Please call our office 2 months in advance to schedule this appointment.  You may see Glenetta Hew, MD or one of the following Advanced Practice Providers on your designated Care Team:  Rosaria Ferries, PA-C, Jory Sims, DNP, ANP  Any Other Special Instructions Will Be Listed Below (If Applicable).      Studies Ordered:   Orders Placed This Encounter  Procedures  . EKG 12-Lead       Glenetta Hew, M.D., M.S. Interventional Cardiologist   Pager # [phone] Phone # [phone] [address] [place], [place] [address]   Thank you for choosing Heartcare at [facility]!!

## 2018-11-14 NOTE — Patient Instructions (Signed)
Medication Instructions:  FINISH the bottle of Diltiazem 180mg  THEN  START Diltiazem 240mg  Take 1 tablet once a day If you need a refill on your cardiac medications before your next appointment, please call your pharmacy.   Lab work: None  If you have labs (blood work) drawn today and your tests are completely normal, you will receive your results only by: [name] MyChart Message (if you have MyChart) OR . A paper copy in the mail If you have any lab test that is abnormal or we need to change your treatment, we will call you to review the results.  Testing/Procedures: None   Follow-Up: At [facility], you and your health needs are our priority.  As part of our continuing mission to provide you with exceptional heart care, we have created designated Provider Care Teams.  These Care Teams include your primary Cardiologist (physician) and Advanced Practice Providers (APPs -  Physician Assistants and Nurse Practitioners) who all work together to provide you with the care you need, when you need it. You will need a follow up appointment in 7 months.  Please call our office 2 months in advance to schedule this appointment.  You may see Glenetta Hew, MD or one of the following Advanced Practice Providers on your designated Care Team:   Rosaria Ferries, PA-C . Jory Sims, DNP, ANP  Any Other Special Instructions Will Be Listed Below (If Applicable).

## 2018-11-15 DIAGNOSIS — M25562 Pain in left knee: Secondary | ICD-10-CM | POA: Diagnosis not present

## 2018-11-15 DIAGNOSIS — R2681 Unsteadiness on feet: Secondary | ICD-10-CM | POA: Diagnosis not present

## 2018-11-15 DIAGNOSIS — Z9181 History of falling: Secondary | ICD-10-CM | POA: Diagnosis not present

## 2018-11-15 DIAGNOSIS — M6281 Muscle weakness (generalized): Secondary | ICD-10-CM | POA: Diagnosis not present

## 2018-11-15 DIAGNOSIS — Z7389 Other problems related to life management difficulty: Secondary | ICD-10-CM | POA: Diagnosis not present

## 2018-11-15 DIAGNOSIS — I509 Heart failure, unspecified: Secondary | ICD-10-CM | POA: Diagnosis not present

## 2018-11-17 ENCOUNTER — Other Ambulatory Visit: Payer: Self-pay | Admitting: Cardiology

## 2018-11-17 ENCOUNTER — Encounter: Payer: Self-pay | Admitting: Cardiology

## 2018-11-17 NOTE — Assessment & Plan Note (Signed)
Nonischemic Myoview in 2013 with fixed infarct noted.  No significant heart failure symptoms and no recurrent angina symptoms.  Preserved EF. I suspect that his edema is probably related to venous stasis and not CHF. Not taking Lasix every day, but intermittently.

## 2018-11-17 NOTE — Assessment & Plan Note (Signed)
Now pretty much persistent A. fib with plans for rate control only as he failed rhythm control. He does not seem to have as good control as we thought on current dose of diltiazem based on his EKG here today.  Will increase diltiazem 240 mg daily. Continue Eliquis.

## 2018-11-17 NOTE — Assessment & Plan Note (Signed)
Well-controlled on current dose of Lasix.  Intermittently taking more doses, but sometimes not taking it daily. I talk about trying to wear support stockings to help.  He has difficulty putting them on.  Continue foot elevation.

## 2018-11-17 NOTE — Assessment & Plan Note (Addendum)
On stable dose of Lipitor.  Monitored by PCP.  Labs not available

## 2018-11-17 NOTE — Assessment & Plan Note (Signed)
Clearly is limited by his hips, spinal stenosis, and kyphosis along with ILD.  Very deconditioned.  He says he is as active as he wants to be.  He uses home oxygen and therefore is content.  No further testing.

## 2018-11-17 NOTE — Assessment & Plan Note (Addendum)
Very distant history of PCI initially back in 1996 and then 2009 to a diagonal branch.  Has not had any further anginal symptoms. Last Myoview was nonischemic in 2013.  EF preserved by echo.  Not really having angina or heart failure symptoms. ->  Based on previous discussions with him, we would avoid any ischemic evaluations unless he were to have worsening symptoms.  He indicated that he probably would not be all that interested in invasive procedures.  With normal/preserved EF we have replaced beta-blocker with diltiazem after he had significant fatigue with beta-blocker. On stable dose of ARB (previously on Micardis, converted to losartan for financial issues). On stable dose of statin, not on aspirin or Plavix because of Eliquis.

## 2018-11-17 NOTE — Assessment & Plan Note (Signed)
Blood pressure stable on current dose of ARB and diltiazem.  I think he can tolerate further titration of diltiazem dose up to 240 mg daily.

## 2018-11-20 ENCOUNTER — Other Ambulatory Visit: Payer: Self-pay | Admitting: Diagnostic Radiology

## 2018-11-20 DIAGNOSIS — Z9181 History of falling: Secondary | ICD-10-CM | POA: Diagnosis not present

## 2018-11-20 DIAGNOSIS — R2681 Unsteadiness on feet: Secondary | ICD-10-CM | POA: Diagnosis not present

## 2018-11-20 DIAGNOSIS — M25562 Pain in left knee: Secondary | ICD-10-CM | POA: Diagnosis not present

## 2018-11-20 DIAGNOSIS — I509 Heart failure, unspecified: Secondary | ICD-10-CM | POA: Diagnosis not present

## 2018-11-20 DIAGNOSIS — I4819 Other persistent atrial fibrillation: Secondary | ICD-10-CM

## 2018-11-20 DIAGNOSIS — Z7389 Other problems related to life management difficulty: Secondary | ICD-10-CM | POA: Diagnosis not present

## 2018-11-20 DIAGNOSIS — M6281 Muscle weakness (generalized): Secondary | ICD-10-CM | POA: Diagnosis not present

## 2018-11-20 MED ORDER — DILTIAZEM HCL ER COATED BEADS 240 MG PO CP24: 240.0000 mg | ORAL_CAPSULE | Freq: Every day | ORAL | 3 refills | Status: DC

## 2018-11-20 NOTE — Telephone Encounter (Signed)
Follow Up::    Pt wants his Diltiazem send to [facility] [phone] instead to his local pharmacy.

## 2018-11-21 ENCOUNTER — Other Ambulatory Visit: Payer: Self-pay | Admitting: Cardiology

## 2018-11-21 ENCOUNTER — Telehealth: Payer: Self-pay | Admitting: *Deleted

## 2018-11-21 DIAGNOSIS — M6281 Muscle weakness (generalized): Secondary | ICD-10-CM | POA: Diagnosis not present

## 2018-11-21 DIAGNOSIS — M25562 Pain in left knee: Secondary | ICD-10-CM | POA: Diagnosis not present

## 2018-11-21 DIAGNOSIS — I509 Heart failure, unspecified: Secondary | ICD-10-CM | POA: Diagnosis not present

## 2018-11-21 DIAGNOSIS — R2681 Unsteadiness on feet: Secondary | ICD-10-CM | POA: Diagnosis not present

## 2018-11-21 DIAGNOSIS — Z9181 History of falling: Secondary | ICD-10-CM | POA: Diagnosis not present

## 2018-11-21 DIAGNOSIS — Z7389 Other problems related to life management difficulty: Secondary | ICD-10-CM | POA: Diagnosis not present

## 2018-11-21 NOTE — Telephone Encounter (Signed)

## 2018-11-21 NOTE — Telephone Encounter (Signed)

## 2018-11-22 DIAGNOSIS — Z9181 History of falling: Secondary | ICD-10-CM | POA: Diagnosis not present

## 2018-11-22 DIAGNOSIS — M6281 Muscle weakness (generalized): Secondary | ICD-10-CM | POA: Diagnosis not present

## 2018-11-22 DIAGNOSIS — M25562 Pain in left knee: Secondary | ICD-10-CM | POA: Diagnosis not present

## 2018-11-22 DIAGNOSIS — R2681 Unsteadiness on feet: Secondary | ICD-10-CM | POA: Diagnosis not present

## 2018-11-22 DIAGNOSIS — Z7389 Other problems related to life management difficulty: Secondary | ICD-10-CM | POA: Diagnosis not present

## 2018-11-22 DIAGNOSIS — I509 Heart failure, unspecified: Secondary | ICD-10-CM | POA: Diagnosis not present

## 2018-11-23 ENCOUNTER — Other Ambulatory Visit: Payer: Self-pay

## 2018-11-23 ENCOUNTER — Ambulatory Visit (INDEPENDENT_AMBULATORY_CARE_PROVIDER_SITE_OTHER)
Admission: RE | Admit: 2018-11-23 | Discharge: 2018-11-23 | Disposition: A | Discharge status: Home / Self Care | Payer: Medicare Other | Source: Ambulatory Visit | Attending: Primary Care | Admitting: Primary Care

## 2018-11-23 DIAGNOSIS — J849 Interstitial pulmonary disease, unspecified: Secondary | ICD-10-CM

## 2018-11-23 DIAGNOSIS — R918 Other nonspecific abnormal finding of lung field: Secondary | ICD-10-CM | POA: Diagnosis not present

## 2018-11-26 ENCOUNTER — Telehealth: Payer: Self-pay | Admitting: Primary Care

## 2018-11-26 DIAGNOSIS — Z01818 Encounter for other preprocedural examination: Secondary | ICD-10-CM

## 2018-11-26 DIAGNOSIS — R918 Other nonspecific abnormal finding of lung field: Secondary | ICD-10-CM

## 2018-11-26 DIAGNOSIS — Z01812 Encounter for preprocedural laboratory examination: Secondary | ICD-10-CM

## 2018-11-26 NOTE — Telephone Encounter (Signed)
Please let patient know CT chest showed Spectrum of findings compatible with basilar predominant fibrotic interstitial lung disease  Without interval progression since 09/11/2017.   New scattered right upper lobe vague subsolid pulmonary nodules, largest 1.0 cm, favor inflammatory.   Please order Chest CT with IV contrast follow-up in 3-6 months is recommended

## 2018-11-27 DIAGNOSIS — I509 Heart failure, unspecified: Secondary | ICD-10-CM | POA: Diagnosis not present

## 2018-11-27 DIAGNOSIS — M6281 Muscle weakness (generalized): Secondary | ICD-10-CM | POA: Diagnosis not present

## 2018-11-27 DIAGNOSIS — M25562 Pain in left knee: Secondary | ICD-10-CM | POA: Diagnosis not present

## 2018-11-27 DIAGNOSIS — Z9181 History of falling: Secondary | ICD-10-CM | POA: Diagnosis not present

## 2018-11-27 DIAGNOSIS — R2681 Unsteadiness on feet: Secondary | ICD-10-CM | POA: Diagnosis not present

## 2018-11-27 DIAGNOSIS — Z7389 Other problems related to life management difficulty: Secondary | ICD-10-CM | POA: Diagnosis not present

## 2018-11-27 NOTE — Telephone Encounter (Signed)
Relayed results to pt.  EW spoke with pt for further explanation.  Order placed.  Nothing further is needed.

## 2018-11-28 DIAGNOSIS — M6281 Muscle weakness (generalized): Secondary | ICD-10-CM | POA: Diagnosis not present

## 2018-11-28 DIAGNOSIS — Z9181 History of falling: Secondary | ICD-10-CM | POA: Diagnosis not present

## 2018-11-28 DIAGNOSIS — I509 Heart failure, unspecified: Secondary | ICD-10-CM | POA: Diagnosis not present

## 2018-11-28 DIAGNOSIS — M25562 Pain in left knee: Secondary | ICD-10-CM | POA: Diagnosis not present

## 2018-11-28 DIAGNOSIS — Z7389 Other problems related to life management difficulty: Secondary | ICD-10-CM | POA: Diagnosis not present

## 2018-11-28 DIAGNOSIS — R2681 Unsteadiness on feet: Secondary | ICD-10-CM | POA: Diagnosis not present

## 2018-11-29 DIAGNOSIS — I509 Heart failure, unspecified: Secondary | ICD-10-CM | POA: Diagnosis not present

## 2018-11-29 DIAGNOSIS — Z7389 Other problems related to life management difficulty: Secondary | ICD-10-CM | POA: Diagnosis not present

## 2018-11-29 DIAGNOSIS — M25562 Pain in left knee: Secondary | ICD-10-CM | POA: Diagnosis not present

## 2018-11-29 DIAGNOSIS — Z9181 History of falling: Secondary | ICD-10-CM | POA: Diagnosis not present

## 2018-11-29 DIAGNOSIS — M6281 Muscle weakness (generalized): Secondary | ICD-10-CM | POA: Diagnosis not present

## 2018-11-29 DIAGNOSIS — R2681 Unsteadiness on feet: Secondary | ICD-10-CM | POA: Diagnosis not present

## 2018-12-01 ENCOUNTER — Other Ambulatory Visit: Payer: Self-pay | Admitting: Cardiology

## 2018-12-04 DIAGNOSIS — Z9181 History of falling: Secondary | ICD-10-CM | POA: Diagnosis not present

## 2018-12-04 DIAGNOSIS — M25562 Pain in left knee: Secondary | ICD-10-CM | POA: Diagnosis not present

## 2018-12-04 DIAGNOSIS — M6281 Muscle weakness (generalized): Secondary | ICD-10-CM | POA: Diagnosis not present

## 2018-12-04 DIAGNOSIS — I509 Heart failure, unspecified: Secondary | ICD-10-CM | POA: Diagnosis not present

## 2018-12-04 DIAGNOSIS — Z7389 Other problems related to life management difficulty: Secondary | ICD-10-CM | POA: Diagnosis not present

## 2018-12-04 DIAGNOSIS — R2681 Unsteadiness on feet: Secondary | ICD-10-CM | POA: Diagnosis not present

## 2018-12-05 DIAGNOSIS — Z9181 History of falling: Secondary | ICD-10-CM | POA: Diagnosis not present

## 2018-12-05 DIAGNOSIS — R279 Unspecified lack of coordination: Secondary | ICD-10-CM | POA: Diagnosis not present

## 2018-12-05 DIAGNOSIS — M6281 Muscle weakness (generalized): Secondary | ICD-10-CM | POA: Diagnosis not present

## 2018-12-05 DIAGNOSIS — I509 Heart failure, unspecified: Secondary | ICD-10-CM | POA: Diagnosis not present

## 2018-12-05 DIAGNOSIS — Z7389 Other problems related to life management difficulty: Secondary | ICD-10-CM | POA: Diagnosis not present

## 2018-12-05 DIAGNOSIS — M25562 Pain in left knee: Secondary | ICD-10-CM | POA: Diagnosis not present

## 2018-12-05 DIAGNOSIS — R2681 Unsteadiness on feet: Secondary | ICD-10-CM | POA: Diagnosis not present

## 2019-01-30 ENCOUNTER — Other Ambulatory Visit: Payer: Self-pay

## 2019-01-30 ENCOUNTER — Emergency Department (HOSPITAL_COMMUNITY): Payer: Medicare Other

## 2019-01-30 ENCOUNTER — Encounter (HOSPITAL_COMMUNITY): Payer: Self-pay | Admitting: Emergency Medicine

## 2019-01-30 ENCOUNTER — Emergency Department (HOSPITAL_COMMUNITY)
Admission: EM | Admit: 2019-01-30 | Discharge: 2019-01-30 | Disposition: A | Discharge status: Home / Self Care | Payer: Medicare Other | Attending: Emergency Medicine | Admitting: Emergency Medicine

## 2019-01-30 DIAGNOSIS — R600 Localized edema: Secondary | ICD-10-CM | POA: Diagnosis not present

## 2019-01-30 DIAGNOSIS — B353 Tinea pedis: Secondary | ICD-10-CM | POA: Insufficient documentation

## 2019-01-30 DIAGNOSIS — I252 Old myocardial infarction: Secondary | ICD-10-CM | POA: Diagnosis not present

## 2019-01-30 DIAGNOSIS — Z79899 Other long term (current) drug therapy: Secondary | ICD-10-CM | POA: Insufficient documentation

## 2019-01-30 DIAGNOSIS — L539 Erythematous condition, unspecified: Secondary | ICD-10-CM | POA: Diagnosis present

## 2019-01-30 DIAGNOSIS — I10 Essential (primary) hypertension: Secondary | ICD-10-CM | POA: Insufficient documentation

## 2019-01-30 DIAGNOSIS — Z87891 Personal history of nicotine dependence: Secondary | ICD-10-CM | POA: Insufficient documentation

## 2019-01-30 DIAGNOSIS — I2510 Atherosclerotic heart disease of native coronary artery without angina pectoris: Secondary | ICD-10-CM | POA: Insufficient documentation

## 2019-01-30 DIAGNOSIS — I4891 Unspecified atrial fibrillation: Secondary | ICD-10-CM | POA: Diagnosis not present

## 2019-01-30 DIAGNOSIS — L03116 Cellulitis of left lower limb: Secondary | ICD-10-CM | POA: Diagnosis not present

## 2019-01-30 DIAGNOSIS — Z7901 Long term (current) use of anticoagulants: Secondary | ICD-10-CM | POA: Insufficient documentation

## 2019-01-30 DIAGNOSIS — R0602 Shortness of breath: Secondary | ICD-10-CM | POA: Diagnosis not present

## 2019-01-30 LAB — COMPREHENSIVE METABOLIC PANEL
ALT: 9 U/L (ref 0–44)
AST: 14 U/L — ABNORMAL LOW (ref 15–41)
Albumin: 3.2 g/dL — ABNORMAL LOW (ref 3.5–5.0)
Alkaline Phosphatase: 77 U/L (ref 38–126)
Anion gap: 11 (ref 5–15)
BUN: 14 mg/dL (ref 8–23)
CO2: 26 mmol/L (ref 22–32)
Calcium: 9 mg/dL (ref 8.9–10.3)
Chloride: 101 mmol/L (ref 98–111)
Creatinine, Ser: 0.95 mg/dL (ref 0.61–1.24)
GFR calc Af Amer: >60 mL/min (ref >60)
GFR calc non Af Amer: >60 mL/min (ref >60)
Glucose, Bld: 143 mg/dL — ABNORMAL HIGH (ref 70–99)
Potassium: 3.8 mmol/L (ref 3.5–5.1)
Sodium: 138 mmol/L (ref 135–145)
Total Bilirubin: 0.4 mg/dL (ref 0.3–1.2)
Total Protein: 6.9 g/dL (ref 6.5–8.1)

## 2019-01-30 LAB — CBC WITH DIFFERENTIAL/PLATELET
Abs Immature Granulocytes: 0.05 10*3/uL (ref 0.00–0.07)
Basophils Absolute: 0.1 10*3/uL (ref 0.0–0.1)
Basophils Relative: 1 %
Eosinophils Absolute: 0.2 10*3/uL (ref 0.0–0.5)
Eosinophils Relative: 2 %
HCT: 33.6 % — ABNORMAL LOW (ref 39.0–52.0)
Hemoglobin: 9.9 g/dL — ABNORMAL LOW (ref 13.0–17.0)
Immature Granulocytes: 1 %
Lymphocytes Relative: 13 %
Lymphs Abs: 1.1 10*3/uL (ref 0.7–4.0)
MCH: 24.7 pg — ABNORMAL LOW (ref 26.0–34.0)
MCHC: 29.5 g/dL — ABNORMAL LOW (ref 30.0–36.0)
MCV: 83.8 fL (ref 80.0–100.0)
Monocytes Absolute: 0.6 10*3/uL (ref 0.1–1.0)
Monocytes Relative: 8 %
Neutro Abs: 6.2 10*3/uL (ref 1.7–7.7)
Neutrophils Relative %: 75 %
Platelets: 309 10*3/uL (ref 150–400)
RBC: 4.01 MIL/uL — ABNORMAL LOW (ref 4.22–5.81)
RDW: 20.2 % — ABNORMAL HIGH (ref 11.5–15.5)
WBC: 8.2 10*3/uL (ref 4.0–10.5)
nRBC: 0 % (ref 0.0–0.2)

## 2019-01-30 LAB — URINALYSIS, ROUTINE W REFLEX MICROSCOPIC
Bilirubin Urine: NEGATIVE
Glucose, UA: NEGATIVE mg/dL
Ketones, ur: NEGATIVE mg/dL
Leukocytes,Ua: NEGATIVE
Nitrite: NEGATIVE
Protein, ur: 100 mg/dL — AB
Specific Gravity, Urine: 1.027 (ref 1.005–1.030)
pH: 5 (ref 5.0–8.0)

## 2019-01-30 LAB — LACTIC ACID, PLASMA
Lactic Acid, Venous: 2.1 mmol/L (ref 0.5–1.9)
Lactic Acid, Venous: 1.5 mmol/L (ref 0.5–1.9)

## 2019-01-30 MED ORDER — SODIUM CHLORIDE 0.9% FLUSH: 3.0000 mL | Freq: Once | INTRAVENOUS | Status: DC

## 2019-01-30 MED ORDER — CLOTRIMAZOLE 1 % EX CREA: TOPICAL_CREAM | CUTANEOUS | 0 refills | Status: DC

## 2019-01-30 MED ORDER — CEPHALEXIN 500 MG PO CAPS: 500.0000 mg | ORAL_CAPSULE | Freq: Four times a day (QID) | ORAL | 0 refills | Status: DC

## 2019-01-30 NOTE — ED Notes (Signed)
Pt discharged from ED; instructions provided and scripts given; Pt encouraged to return to ED if symptoms worsen and to f/u with PCP; Pt verbalized understanding of all instructions 

## 2019-01-30 NOTE — ED Triage Notes (Signed)
Pt with right foot erythema, swelling and pain since last night. He reports it intermittent when the pain comes. Denies fevers. Wears continues oxygen. A/Ox4

## 2019-01-30 NOTE — ED Provider Notes (Signed)
[facility] EMERGENCY DEPARTMENT Provider Note   CSN: [id number] Arrival date & time: 01/30/19  1639     History   Chief Complaint Chief Complaint  Patient presents with  . Cellulitis  . Leg Swelling    HPI Jerry [name] is a 85 y.o. male who presents with left foot pain and erythema.  Past medical history significant for coronary artery disease, A. fib on Eliquis, hypertension, hyperlipidemia, chronic lung disease on 2 L of oxygen chronically.  Patient is accompanied by his wife.  He states that last night he started to develop some pain in his left foot.  He states that the pain came "like clockwork" every 20 seconds. He took some Tylenol and this improved his pain. He was worried about the redness and so he called his doctor's office and they told him to come to the ED in case any testing needed to be done. He denies fever, chills, chest pain, SOB, abdominal pain, right leg pain.    HPI  Past Medical History:  Diagnosis Date  . Anxiety   . CAD S/P percutaneous coronary angioplasty 1996, 2009, 01/2010   PTCA of L Cx 1996; BMS-RCA [facility]) 2009; Staged PCI RCA (70% ISR) --> 3.0 mm x 23 mm BMS, staged PCI Diag - 2.0 mm x 12 mm Mini Vision BMS (2.25 mm)  . Cataracts, bilateral    immature  . Chronic back pain    scoliosis--pt states unable to have surgery bc of age  . GERD (gastroesophageal reflux disease)    takes Nexium daily  . History of colon polyps   . Hyperlipidemia   . Hypertension   . Myocardial infarction [facility]) 1996, 2009   x 2;last time was about 8-59yrs ago   . Obesity (BMI 30.0-34.9)   . OSA (obstructive sleep apnea)   . Osteoarthritis of both knees   . Peripheral edema    Venous Stasis  . Persistent atrial fibrillation    Unable to maintaine NSR after DCCV with Tikosyn.  Plan = rate control w/diltiazem (beta blocker off b/c fatigue). CHA2DS2Vasc 4. DOAC - Eliquis.   . Prostate cancer ([place]) 2005   seed implant    Patient Active  Problem List   Diagnosis Date Noted  . Physical deconditioning 01/24/2018  . Pedal edema 01/09/2018  . ILD (interstitial lung disease) ([place]) 10/20/2017  . OSA (obstructive sleep apnea) 10/20/2017  . Chronic respiratory failure with hypoxia ([place]) 10/20/2017  . Nocturnal hypoxia 09/06/2017  . Persistent atrial fibrillation ([place]):  CHA2DS2-VASc Score 4; On Eliquis   . Exertional dyspnea and fatigue 08/13/2013  . CAD S/P percutaneous coronary angioplasty   . Hyperlipidemia with target LDL less than 70   . Essential hypertension   . Obesity (BMI 30.0-34.9)   . Myocardial infarction; history of   . Osteoarthritis of right knee 05/11/2012    Past Surgical History:  Procedure Laterality Date  . CARDIOVERSION N/A 06/12/2015   Procedure: CARDIOVERSION;  Surgeon: Sanda Klein, MD;  Location: [facility];  Service: Cardiovascular;  Laterality: N/A;  . CARDIOVERSION N/A 08/02/2015   Procedure: CARDIOVERSION;  Surgeon: [name], MD;  Location: [facility];  Service: Cardiovascular;  Laterality: N/A;  . CHOLECYSTECTOMY    . COLONOSCOPY    . CORONARY ANGIOPLASTY WITH STENT PLACEMENT  1996, 2009, August 2011   PTCA of L Cx 1996; BMS-RCA [facility]) 2009; Staged PCI RCA (70% ISR) --> 3.0 mm x 23 mm BMS, staged PCI Diag - 2.0 mm x 12 mm Mini  Vision BMS (2.25 mm)  . cyst removed  in college  . NEUROPLASTY / TRANSPOSITION MEDIAN NERVE AT CARPAL TUNNEL BILATERAL    . NM MYOVIEW LTD  November 2013   EF 53%; fixed mid inferior-inferolateral defect, infarct with no ischemia.  . right knee arthrsoscopy  10yrs ago  . right trigger finger    . seeds placed into prostate   2005  . TEE WITHOUT CARDIOVERSION N/A 06/12/2015   Procedure: TRANSESOPHAGEAL ECHOCARDIOGRAM (TEE) - WITH CARDIOVERSION;  Surgeon: Sanda Klein, MD;  Location: [facility];  Service: Cardiovascular: EF 55-60%.No LA or LAA thrombus. Normal Peal PAP ~33 mmHg.  [name] TOTAL KNEE ARTHROPLASTY  05/09/2012   Procedure: TOTAL KNEE ARTHROPLASTY;  Surgeon:  Kerin Salen, MD;  Location: [facility];  Service: Orthopedics;  Laterality: Right;  . TRANSTHORACIC ECHOCARDIOGRAM  06/2015   EF 60-65%. Mild LA dilation. Normal PA pressures: 32 mmHg. Aortic sclerosis but no stenosis.  [name] wisdom teeth extraccted          Home Medications    Prior to Admission medications   Medication Sig Start Date End Date Taking? Authorizing Provider  acetaminophen (TYLENOL) 500 MG tablet Take 1,000 mg by mouth daily as needed (pain).    [provider]  ALPRAZolam Duanne Moron) 0.25 MG tablet Take 0.25 mg by mouth as needed for anxiety.    [provider]  atorvastatin (LIPITOR) 40 MG tablet TAKE 1 TABLET DAILY AT BEDTIME 11/19/18   Leonie Man, MD  Cholecalciferol (VITAMIN D3) 1000 units CAPS Take 1 capsule by mouth daily.     [provider]  diltiazem (CARDIZEM CD) 240 MG 24 hr capsule Take 1 capsule (240 mg total) by mouth daily. 11/20/18   Leonie Man, MD  ELIQUIS 5 MG TABS tablet TAKE 1 TABLET TWICE A DAY (SCHEDULE FOLLOW UP WITH CARDIOLOGIST PRIOR TO NEXT REFILL REQUEST) 07/11/18   Leonie Man, MD  esomeprazole (NEXIUM) 40 MG capsule TAKE 1 CAPSULE DAILY 09/24/18   Leonie Man, MD  furosemide (LASIX) 20 MG tablet Take 1 tablet (20 mg total) by mouth daily as needed. 11/21/18   Leonie Man, MD  hydrocortisone cream 0.5 % Apply 1 application topically daily. Use as directed    [provider]  losartan (COZAAR) 100 MG tablet TAKE 1 TABLET DAILY (SCHEDULE APPOINTMENT FOR REFILLS) 12/03/18   Leonie Man, MD  magnesium hydroxide (MILK OF MAGNESIA) 400 MG/5ML suspension Take 30 mLs by mouth daily as needed for mild constipation.    [provider]  magnesium oxide (MAG-OX) 400 MG tablet Take 1 tablet (400 mg total) by mouth 2 (two) times daily. 08/19/15   Sherran Needs, NP  mirabegron ER (MYRBETRIQ) 25 MG TB24 tablet Take 25 mg by mouth daily.     [provider]  NITROSTAT 0.4 MG SL tablet USE AS  NEEDED 02/22/17   Leonie Man, MD  Polyethylene Glycol 3350 (MIRALAX PO) Take 17 g by mouth daily as needed (Constipation).     [provider]  potassium chloride SA (K-DUR,KLOR-CON) 20 MEQ tablet Take 1 tablet (20 mEq total) by mouth daily. 07/24/18   Allred, Jeneen Rinks, MD  tamsulosin (FLOMAX) 0.4 MG CAPS capsule Take 0.4 mg by mouth at bedtime.  06/24/15   [provider]    Family History Family History  Problem Relation Age of Onset  . Cancer Mother 22       abdomen died in 101s  . Heart attack Father 80  died in 70s    Social History Social History   Tobacco Use  . Smoking status: Former Smoker    Types: Pipe    Quit date: 03/12/1984    Years since quitting: 34.9  . Smokeless tobacco: Never Used  . Tobacco comment: quit smoking pipe about 43yrs ago  Substance Use Topics  . Alcohol use: Yes    Comment: glass of wine daily  . Drug use: No     Allergies   Crab [shellfish allergy], Hydrocodone, Adhesive [tape], and Oxycodone   Review of Systems Review of Systems  Constitutional: Negative for fever.  Respiratory: Negative for shortness of breath.   Cardiovascular: Negative for chest pain.  Gastrointestinal: Negative for abdominal pain.  Skin: Positive for color change and rash. Negative for wound.  Neurological: Negative for headaches.  All other systems reviewed and are negative.    Physical Exam Updated Vital Signs BP (!) 155/97 (BP Location: Right Arm)   Pulse 97   Temp 98.3 F (36.8 C) (Oral)   Resp 19   SpO2 99%   Physical Exam Vitals signs and nursing note reviewed.  Constitutional:      General: He is not in acute distress.    Appearance: He is well-developed. He is obese. He is not ill-appearing.     Comments: Calm, cooperative. On 2L O2 via Rafael Gonzalez  HENT:     Head: Normocephalic and atraumatic.  Eyes:     General: No scleral icterus.       Right eye: No discharge.        Left eye: No discharge.     Conjunctiva/sclera:  Conjunctivae normal.     Pupils: Pupils are equal, round, and reactive to light.  Neck:     Musculoskeletal: Normal range of motion.  Cardiovascular:     Rate and Rhythm: Normal rate and regular rhythm.  Pulmonary:     Effort: Pulmonary effort is normal. No respiratory distress.     Breath sounds: Normal breath sounds.  Abdominal:     General: There is no distension.     Palpations: Abdomen is soft.     Tenderness: There is no abdominal tenderness.  Musculoskeletal:     Right lower leg: Edema present.     Left lower leg: Edema present.  Skin:    General: Skin is warm and dry.     Findings: Rash (Rash consistent with athlete's foot on the left dorsal foot) present.  Neurological:     Mental Status: He is alert and oriented to person, place, and time.  Psychiatric:        Behavior: Behavior normal.      ED Treatments / Results  Labs (all labs ordered are listed, but only abnormal results are displayed) Labs Reviewed  LACTIC ACID, PLASMA - Abnormal; Notable for the following components:      Result Value   Lactic Acid, Venous 2.1 (*)    All other components within normal limits  COMPREHENSIVE METABOLIC PANEL - Abnormal; Notable for the following components:   Glucose, Bld 143 (*)    Albumin 3.2 (*)    AST 14 (*)    All other components within normal limits  CBC WITH DIFFERENTIAL/PLATELET - Abnormal; Notable for the following components:   RBC 4.01 (*)    Hemoglobin 9.9 (*)    HCT 33.6 (*)    MCH 24.7 (*)    MCHC 29.5 (*)    RDW 20.2 (*)    All other components within normal  limits  URINALYSIS, ROUTINE W REFLEX MICROSCOPIC - Abnormal; Notable for the following components:   Hgb urine dipstick SMALL (*)    Protein, ur 100 (*)    Bacteria, UA RARE (*)    All other components within normal limits  LACTIC ACID, PLASMA    EKG None  Radiology Dg Chest 2 View  Result Date: 01/30/2019 CLINICAL DATA:  Shortness of breath. Possible cellulitis of the left foot. EXAM:  CHEST - 2 VIEW COMPARISON:  Chest radiographs 08/22/2017 and CT 11/23/2018 FINDINGS: The cardiomediastinal silhouette is unchanged with borderline to mild cardiomegaly. Lung volumes are unchanged from the prior radiographs with similar appearance of chronic interstitial densities bilaterally. No superimposed acute airspace consolidation, overt pulmonary edema, pleural effusion, pneumothorax is identified. No acute osseous abnormality is seen. IMPRESSION: Chronic interstitial lung disease without evidence of acute cardiopulmonary process. Electronically Signed   By: Logan Bores M.D.   On: 01/30/2019 17:58    Procedures Procedures (including critical care time)  Medications Ordered in ED Medications  sodium chloride flush (NS) 0.9 % injection 3 mL (has no administration in time range)    Initial Impression / Assessment and Plan / ED Course  I have reviewed the triage vital signs and the nursing notes.  Pertinent labs & imaging results that were available during my care of the patient were reviewed by me and considered in my medical decision making (see chart for details).  85 year old male presents with left foot pain for one day. He is hypertensive and in rate controlled A.fib. He has bilateral peripheral edema which is a chronic issue. He has some redness of his left foot which looks consistent with athlete's foot. Labs are remarkable for drop in hgb from 14 in Jan 2019 to 9.9 today. He denies any issues with bleeding. Specifically no black or bloody stools. Lactate is also mildly elevated which I think is most likely from mild dehydration as UA appears very dark. Shared visit with Dr. Rex Kras. Will rx Lotrimin cream and Keflex to cover for cellulitis. Advised f/u with PCP regarding drop in hemoglobin.  Final Clinical Impressions(s) / ED Diagnoses   Final diagnoses:  Tinea pedis of left foot  Cellulitis of left lower extremity    ED Discharge Orders    None       Recardo Evangelist,  PA-C 01/30/19 2309    Little, Wenda Overland, MD 01/31/19 1622

## 2019-01-30 NOTE — Discharge Instructions (Signed)
Use Clotrimazole cream twice a day for one month on the right and left feet Take Keflex 4 times a day for the next 5 days Please follow up with your doctor

## 2019-01-30 NOTE — ED Notes (Signed)
Updated on wait for treatment room. 

## 2019-02-14 DIAGNOSIS — I890 Lymphedema, not elsewhere classified: Secondary | ICD-10-CM | POA: Diagnosis not present

## 2019-02-14 DIAGNOSIS — L600 Ingrowing nail: Secondary | ICD-10-CM | POA: Diagnosis not present

## 2019-02-14 DIAGNOSIS — B353 Tinea pedis: Secondary | ICD-10-CM | POA: Diagnosis not present

## 2019-02-14 DIAGNOSIS — M79671 Pain in right foot: Secondary | ICD-10-CM | POA: Diagnosis not present

## 2019-02-14 DIAGNOSIS — M79672 Pain in left foot: Secondary | ICD-10-CM | POA: Diagnosis not present

## 2019-02-14 DIAGNOSIS — B351 Tinea unguium: Secondary | ICD-10-CM | POA: Diagnosis not present

## 2019-02-18 ENCOUNTER — Telehealth: Payer: Self-pay | Admitting: Pulmonary Disease

## 2019-02-18 NOTE — Telephone Encounter (Signed)
Spoke with pt, advised that there is no pulmonary interaction with him wearing compression stockings.  Pt expressed understanding. Pt then began asking me what kind of stockings he needed to get and where to get them.  I advised pt to follow up with Dr. Marion Downer office for these recs.  Pt expressed understanding.  Nothing further needed at this time- will close encounter.

## 2019-02-18 NOTE — Addendum Note (Signed)
Addended by: Len Blalock on: 02/18/2019 10:30 AM   Modules accepted: Orders

## 2019-02-20 DIAGNOSIS — R531 Weakness: Secondary | ICD-10-CM | POA: Diagnosis not present

## 2019-02-20 DIAGNOSIS — I10 Essential (primary) hypertension: Secondary | ICD-10-CM | POA: Diagnosis not present

## 2019-02-20 DIAGNOSIS — E669 Obesity, unspecified: Secondary | ICD-10-CM | POA: Diagnosis not present

## 2019-02-20 DIAGNOSIS — D649 Anemia, unspecified: Secondary | ICD-10-CM | POA: Diagnosis not present

## 2019-02-20 DIAGNOSIS — R0600 Dyspnea, unspecified: Secondary | ICD-10-CM | POA: Diagnosis not present

## 2019-02-20 DIAGNOSIS — R2689 Other abnormalities of gait and mobility: Secondary | ICD-10-CM | POA: Diagnosis not present

## 2019-03-22 ENCOUNTER — Encounter: Payer: Self-pay | Admitting: Internal Medicine

## 2019-03-22 ENCOUNTER — Non-Acute Institutional Stay (SKILLED_NURSING_FACILITY): Payer: Medicare Other | Admitting: Internal Medicine

## 2019-03-22 DIAGNOSIS — E669 Obesity, unspecified: Secondary | ICD-10-CM

## 2019-03-22 DIAGNOSIS — J9611 Chronic respiratory failure with hypoxia: Secondary | ICD-10-CM

## 2019-03-22 DIAGNOSIS — R5381 Other malaise: Secondary | ICD-10-CM | POA: Diagnosis not present

## 2019-03-22 DIAGNOSIS — Z9861 Coronary angioplasty status: Secondary | ICD-10-CM

## 2019-03-22 DIAGNOSIS — E785 Hyperlipidemia, unspecified: Secondary | ICD-10-CM

## 2019-03-22 DIAGNOSIS — G4733 Obstructive sleep apnea (adult) (pediatric): Secondary | ICD-10-CM

## 2019-03-22 DIAGNOSIS — J849 Interstitial pulmonary disease, unspecified: Secondary | ICD-10-CM | POA: Diagnosis not present

## 2019-03-22 DIAGNOSIS — I4819 Other persistent atrial fibrillation: Secondary | ICD-10-CM | POA: Diagnosis not present

## 2019-03-22 DIAGNOSIS — I10 Essential (primary) hypertension: Secondary | ICD-10-CM | POA: Diagnosis not present

## 2019-03-22 DIAGNOSIS — I2510 Atherosclerotic heart disease of native coronary artery without angina pectoris: Secondary | ICD-10-CM | POA: Diagnosis not present

## 2019-03-22 NOTE — Progress Notes (Signed)
Provider:  Veleta Miners MD Location:    Nursing Home Room Number: 5 Place of Service:  SNF (31)  PCP: Alroy Dust, L.Marlou Sa, MD Patient Care Team: Alroy Dust, Carlean Jews.Marlou Sa, MD as PCP - General (Family Medicine) Leonie Man, MD as PCP - Cardiology (Cardiology)  Extended Emergency Contact Information Primary Emergency Contact: [facility] Address: [address]          [place], [place] [address] [name] of [place] Phone: [phone] Mobile Phone: [phone] Relation: Spouse Secondary Emergency Contact: Janey Genta Address: [address]          [place], [place] Phone: [phone] Mobile Phone: [phone] Relation: Daughter  Code Status: DNR Goals of Care: Advanced Directive information Advanced Directives 10/19/2017  Does Patient Have a Medical Advance Directive? No  Type of Advance Directive -  Copy of [place] in Chart? -  Would patient like information on creating a medical advance directive? No - Patient declined  Pre-existing out of facility DNR order (yellow form or pink MOST form) -      Chief Complaint  Patient presents with  . New Admit To SNF    HPI: Patient is a 85 y.o. male seen today for admission to SNF for therapy and Long term Care  Patient has a history of CAD s/p PTCA, chronic atrial fibrillation on Eliquis, chronic diastolic CHF, Interstitial lung disease with hypoxia on chronic oxygen, hypertension, hyperlipidemia, OSA on CPAP, chronic back pain with spinal stenosis, S C-spine kyphosis, cognitive impairment  Patient used to live fit his wife in apartment.  But over past few months he has progressively gotten more dependent for his ADLs.  His wife is not able to take care of him at home to higher level of care. He was unable to give me a detailed history due to his confusion about the events.  He did have a lot of nursing complaints.  Denied any shortness of breath chest pain cough. He  sleeps in his recliner.  Unable to get up without assist.  Past Medical History:  Diagnosis Date  . Anxiety   . CAD S/P percutaneous coronary angioplasty 1996, 2009, 01/2010   PTCA of L Cx 1996; BMS-RCA [facility]) 2009; Staged PCI RCA (70% ISR) --> 3.0 mm x 23 mm BMS, staged PCI Diag - 2.0 mm x 12 mm Mini Vision BMS (2.25 mm)  . Cataracts, bilateral    immature  . Chronic back pain    scoliosis--pt states unable to have surgery bc of age  . GERD (gastroesophageal reflux disease)    takes Nexium daily  . History of colon polyps   . Hyperlipidemia   . Hypertension   . Myocardial infarction [facility]) 1996, 2009   x 2;last time was about 8-31yrs ago   . Obesity (BMI 30.0-34.9)   . OSA (obstructive sleep apnea)   . Osteoarthritis of both knees   . Peripheral edema    Venous Stasis  . Persistent atrial fibrillation (HCC)    Unable to maintaine NSR after DCCV with Tikosyn.  Plan = rate control w/diltiazem (beta blocker off b/c fatigue). CHA2DS2Vasc 4. DOAC - Eliquis.   . Prostate cancer ([place]) 2005   seed implant   Past Surgical History:  Procedure Laterality Date  . CARDIOVERSION N/A 06/12/2015   Procedure: CARDIOVERSION;  Surgeon: Sanda Klein, MD;  Location: [facility];  Service: Cardiovascular;  Laterality: N/A;  . CARDIOVERSION N/A 08/02/2015   Procedure: CARDIOVERSION;  Surgeon: [name], MD;  Location:  [facility];  Service: Cardiovascular;  Laterality: N/A;  . CHOLECYSTECTOMY    . COLONOSCOPY    . CORONARY ANGIOPLASTY WITH STENT PLACEMENT  1996, 2009, August 2011   PTCA of L Cx 1996; BMS-RCA [facility]) 2009; Staged PCI RCA (70% ISR) --> 3.0 mm x 23 mm BMS, staged PCI Diag - 2.0 mm x 12 mm Mini Vision BMS (2.25 mm)  . cyst removed  in college  . NEUROPLASTY / TRANSPOSITION MEDIAN NERVE AT CARPAL TUNNEL BILATERAL    . NM MYOVIEW LTD  November 2013   EF 53%; fixed mid inferior-inferolateral defect, infarct with no ischemia.  . right knee arthrsoscopy  46yrs ago  . right trigger finger     . seeds placed into prostate   2005  . TEE WITHOUT CARDIOVERSION N/A 06/12/2015   Procedure: TRANSESOPHAGEAL ECHOCARDIOGRAM (TEE) - WITH CARDIOVERSION;  Surgeon: Sanda Klein, MD;  Location: [facility];  Service: Cardiovascular: EF 55-60%.No LA or LAA thrombus. Normal Peal PAP ~33 mmHg.  [name] TOTAL KNEE ARTHROPLASTY  05/09/2012   Procedure: TOTAL KNEE ARTHROPLASTY;  Surgeon: Kerin Salen, MD;  Location: [facility];  Service: Orthopedics;  Laterality: Right;  . TRANSTHORACIC ECHOCARDIOGRAM  06/2015   EF 60-65%. Mild LA dilation. Normal PA pressures: 32 mmHg. Aortic sclerosis but no stenosis.  [name] wisdom teeth extraccted      reports that he quit smoking about 35 years ago. His smoking use included pipe. He has never used smokeless tobacco. He reports current alcohol use. He reports that he does not use drugs. Social History   Socioeconomic History  . Marital status: Married    Spouse name: Not on file  . Number of children: Not on file  . Years of education: Not on file  . Highest education level: Not on file  Occupational History  . Occupation: Retired  Scientific laboratory technician  . Financial resource strain: Not on file  . Food insecurity    Worry: Not on file    Inability: Not on file  . Transportation needs    Medical: Not on file    Non-medical: Not on file  Tobacco Use  . Smoking status: Former Smoker    Types: Pipe    Quit date: 03/12/1984    Years since quitting: 35.0  . Smokeless tobacco: Never Used  . Tobacco comment: quit smoking pipe about 99yrs ago  Substance and Sexual Activity  . Alcohol use: Yes    Comment: glass of wine daily  . Drug use: No  . Sexual activity: Not Currently  Lifestyle  . Physical activity    Days per week: Not on file    Minutes per session: Not on file  . Stress: Not on file  Relationships  . Social Herbalist on phone: Not on file    Gets together: Not on file    Attends religious service: Not on file    Active member of club or organization:  Not on file    Attends meetings of clubs or organizations: Not on file    Relationship status: Not on file  . Intimate partner violence    Fear of current or ex partner: Not on file    Emotionally abused: Not on file    Physically abused: Not on file    Forced sexual activity: Not on file  Other Topics Concern  . Not on file  Social History Narrative   He is a ? WIDOWED father of 2, grandfather of 2.   hE  LIVES @ [facility] (Independent [facility]isted Living)   He does not smoke. Drinks occasional alcohol.     Functional Status Survey:    Family History  Problem Relation Age of Onset  . Cancer Mother 23       abdomen died in 1s  . Heart attack Father 48       died in 26s    Health Maintenance  Topic Date Due  . PNA vac Low Risk Adult (2 of 2 - PCV13) 02/13/2015  . INFLUENZA VACCINE  01/05/2019  . TETANUS/TDAP  12/12/2022    Allergies  Allergen Reactions  . Crab [Shellfish Allergy] Rash  . Hydrocodone Hives  . Adhesive [Tape] Rash  . Oxycodone Rash    Allergies as of 03/22/2019      Reactions   Crab [shellfish Allergy] Rash   Hydrocodone Hives   Adhesive [tape] Rash   Oxycodone Rash      Medication List       Accurate as of March 22, 2019  3:38 PM. If you have any questions, ask your nurse or doctor.        STOP taking these medications   cephALEXin 500 MG capsule Commonly known as: KEFLEX Stopped by: Virgie Dad, MD   clotrimazole 1 % cream Commonly known as: LOTRIMIN Stopped by: Virgie Dad, MD   diltiazem 240 MG 24 hr capsule Commonly known as: CARDIZEM CD Stopped by: Virgie Dad, MD   hydrocortisone cream 0.5 % Stopped by: Virgie Dad, MD   magnesium hydroxide 400 MG/5ML suspension Commonly known as: MILK OF MAGNESIA Stopped by: Virgie Dad, MD   magnesium oxide 400 MG tablet Commonly known as: MAG-OX Stopped by: Virgie Dad, MD   potassium chloride SA 20 MEQ tablet Commonly known as: KLOR-CON Stopped by: Virgie Dad, MD     TAKE these medications   acetaminophen 500 MG tablet Commonly known as: TYLENOL Take 1,000 mg by mouth. Three times a day as needed   ALPRAZolam 0.25 MG tablet Commonly known as: XANAX Take 0.25 mg by mouth 3 (three) times daily as needed for anxiety.   atorvastatin 40 MG tablet Commonly known as: LIPITOR TAKE 1 TABLET DAILY AT BEDTIME   diltiazem 120 MG tablet Commonly known as: CARDIZEM Take two tablets; oral Once A Day   Eliquis 5 MG Tabs tablet Generic drug: apixaban TAKE 1 TABLET TWICE A DAY (SCHEDULE FOLLOW UP WITH CARDIOLOGIST PRIOR TO NEXT REFILL REQUEST)   esomeprazole 40 MG capsule Commonly known as: NEXIUM TAKE 1 CAPSULE DAILY   furosemide 20 MG tablet Commonly known as: LASIX Take 1 tablet (20 mg total) by mouth daily as needed.   losartan 100 MG tablet Commonly known as: COZAAR Take 50 mg by mouth daily. What changed: Another medication with the same name was removed. Continue taking this medication, and follow the directions you see here. Changed by: Virgie Dad, MD   Magnesium 200 MG Tabs Take by mouth. 1 tablet once a day   MIRALAX PO Take 17 g by mouth daily as needed (Constipation).   Myrbetriq 25 MG Tb24 tablet Generic drug: mirabegron ER Take 25 mg by mouth daily.   Nitrostat 0.4 MG SL tablet Generic drug: nitroGLYCERIN USE AS NEEDED   tamsulosin 0.4 MG Caps capsule Commonly known as: FLOMAX Take 0.4 mg by mouth at bedtime.   Vitamin D3 25 MCG (1000 UT) Caps Take 1 capsule by mouth daily.  Review of Systems  Constitutional: Positive for activity change.  HENT: Negative.   Respiratory: Positive for shortness of breath.   Cardiovascular: Positive for leg swelling.  Gastrointestinal: Negative.   Genitourinary: Negative.   Musculoskeletal: Positive for back pain and gait problem.  Neurological: Positive for weakness.  Psychiatric/Behavioral: Positive for confusion and dysphoric mood.  All other systems  reviewed and are negative.   Vitals:   03/22/19 1526  BP: 132/62  Pulse: 86  Resp: 18  Temp: (!) 97.3 F (36.3 C)  SpO2: 97%  Weight: 244 lb (110.7 kg)  Height: 5\' 10"  (1.778 m)   Body mass index is 35.01 kg/m. Physical Exam Vitals signs reviewed.  Constitutional:      Appearance: He is obese.  HENT:     Head: Normocephalic.     Nose: Nose normal.     Mouth/Throat:     Mouth: Mucous membranes are moist.     Pharynx: Oropharynx is clear.  Eyes:     Pupils: Pupils are equal, round, and reactive to light.  Neck:     Musculoskeletal: Neck supple.  Cardiovascular:     Rate and Rhythm: Normal rate. Rhythm irregular.     Pulses: Normal pulses.  Pulmonary:     Effort: Pulmonary effort is normal. No respiratory distress.     Breath sounds: Normal breath sounds. No wheezing or rales.  Abdominal:     General: Abdomen is flat. Bowel sounds are normal.     Palpations: Abdomen is soft.  Musculoskeletal:     Comments: Mild edema Bilateral  Skin:    General: Skin is warm and dry.  Neurological:     General: No focal deficit present.     Mental Status: He is alert.     Comments: Does have Bilateral LE weakness.   Psychiatric:        Mood and Affect: Mood normal.        Thought Content: Thought content normal.        Judgment: Judgment normal.     Labs reviewed: Basic Metabolic Panel: Recent Labs    07/19/18 1505 01/30/19 1700  NA 139 138  K 4.3 3.8  CL 106 101  CO2 29 26  GLUCOSE 112* 143*  BUN 14 14  CREATININE 1.04 0.95  CALCIUM 8.7* 9.0  MG 1.9  --    Liver Function Tests: Recent Labs    01/30/19 1700  AST 14*  ALT 9  ALKPHOS 77  BILITOT 0.4  PROT 6.9  ALBUMIN 3.2*   No results for input(s): LIPASE, AMYLASE in the last 8760 hours. No results for input(s): AMMONIA in the last 8760 hours. CBC: Recent Labs    01/30/19 1700  WBC 8.2  NEUTROABS 6.2  HGB 9.9*  HCT 33.6*  MCV 83.8  PLT 309   Cardiac Enzymes: No results for input(s): CKTOTAL,  CKMB, CKMBINDEX, TROPONINI in the last 8760 hours. BNP: Invalid input(s): POCBNP No results found for: HGBA1C Lab Results  Component Value Date   TSH 2.00 02/24/2016   No results found for: VITAMINB12 No results found for: FOLATE No results found for: IRON, TIBC, FERRITIN  Imaging and Procedures obtained prior to SNF admission: Dg Chest 2 View  Result Date: 01/30/2019 CLINICAL DATA:  Shortness of breath. Possible cellulitis of the left foot. EXAM: CHEST - 2 VIEW COMPARISON:  Chest radiographs 08/22/2017 and CT 11/23/2018 FINDINGS: The cardiomediastinal silhouette is unchanged with borderline to mild cardiomegaly. Lung volumes are unchanged from the prior radiographs with  similar appearance of chronic interstitial densities bilaterally. No superimposed acute airspace consolidation, overt pulmonary edema, pleural effusion, pneumothorax is identified. No acute osseous abnormality is seen. IMPRESSION: Chronic interstitial lung disease without evidence of acute cardiopulmonary process. Electronically Signed   By: Logan Bores M.D.   On: 01/30/2019 17:58    Assessment/Plan  ILD (interstitial lung disease) ([place]) Seems to be stable Follows with pulmonology Has a repeat CT scan pending due to some pulmonary nodules seen on the last CT On Chronic Oxygen  Persistent atrial fibrillation ([place]):  CHA2DS2-VASc Score 4; On Eliquis Cardiazem dose was increased recently to control his heart rate Continues to be on Eliquis Diastolic CHF with LE edema On Lasix Essential hypertension On Cardizem and Cozaar CAD S/P percutaneous coronary angioplasty Follows with cardiology On statin OSA (obstructive sleep apnea) Continues on CPAP  Physical deconditioning We will have PT OT evaluate him Hyperlipidemia with target LDL less than 70 On statin Urinary incontinence Continue Flomax and Myrebetriq Anxiety On Xanax PRn Cognitive Impairment Check MMSE Family/ staff Communication:   Labs/tests  ordered: BMP,CBC,TSH,Fasting Lipid and MMSE    Total time spent in this patient care encounter was  _45  minutes; greater than 50% of the visit spent counseling patient and staff, reviewing records , Labs and coordinating care for problems addressed at this encounter.

## 2019-03-25 ENCOUNTER — Non-Acute Institutional Stay (SKILLED_NURSING_FACILITY): Payer: Medicare Other | Admitting: Nurse Practitioner

## 2019-03-25 ENCOUNTER — Encounter: Payer: Self-pay | Admitting: Nurse Practitioner

## 2019-03-25 DIAGNOSIS — F419 Anxiety disorder, unspecified: Secondary | ICD-10-CM

## 2019-03-25 DIAGNOSIS — I10 Essential (primary) hypertension: Secondary | ICD-10-CM | POA: Diagnosis not present

## 2019-03-25 DIAGNOSIS — I5030 Unspecified diastolic (congestive) heart failure: Secondary | ICD-10-CM | POA: Diagnosis not present

## 2019-03-25 DIAGNOSIS — M79674 Pain in right toe(s): Secondary | ICD-10-CM | POA: Diagnosis not present

## 2019-03-25 DIAGNOSIS — R600 Localized edema: Secondary | ICD-10-CM | POA: Diagnosis not present

## 2019-03-25 DIAGNOSIS — G8929 Other chronic pain: Secondary | ICD-10-CM | POA: Diagnosis not present

## 2019-03-25 DIAGNOSIS — I4819 Other persistent atrial fibrillation: Secondary | ICD-10-CM

## 2019-03-25 DIAGNOSIS — M25562 Pain in left knee: Secondary | ICD-10-CM

## 2019-03-25 DIAGNOSIS — E870 Hyperosmolality and hypernatremia: Secondary | ICD-10-CM | POA: Diagnosis not present

## 2019-03-25 LAB — BASIC METABOLIC PANEL
BUN: 16 (ref 4–21)
CO2: 28 — AB (ref 13–22)
Chloride: 104 (ref 99–108)
Creatinine: 0.8 (ref 0.6–1.3)
Glucose: 132
Potassium: 4.1 (ref 3.4–5.3)
Sodium: 141 (ref 137–147)

## 2019-03-25 LAB — COMPREHENSIVE METABOLIC PANEL: Calcium: 8.5 — AB (ref 8.7–10.7)

## 2019-03-25 NOTE — Assessment & Plan Note (Signed)
Opted out knee replacement, pain with ROM or walking.

## 2019-03-25 NOTE — Assessment & Plan Note (Signed)
Chronic, continue prn Furosemide 20mg  daily.

## 2019-03-25 NOTE — Progress Notes (Signed)
Location:   SNF [place] Room Number: 5 Place of Service:  SNF (31) Provider:  Kinnie Kaupp NP  Alroy Dust, L.Marlou Sa, MD  Patient Care Team: Alroy Dust, Carlean Jews.Marlou Sa, MD as PCP - General (Family Medicine) Leonie Myana Schlup, MD as PCP - Cardiology (Cardiology)  Extended Emergency Contact Information Primary Emergency Contact: [facility] Address: [address]          [place], [place] [address] [name] of [place] Phone: [phone] Mobile Phone: [phone] Relation: Spouse Secondary Emergency Contact: Janey Genta Address: [address]          [place], [place] Phone: [phone] Mobile Phone: [phone] Relation: Daughter  Code Status:  DNR Goals of care: Advanced Directive information Advanced Directives 10/19/2017  Does Patient Have a Medical Advance Directive? No  Type of Advance Directive -  Copy of [place] in Chart? -  Would patient like information on creating a medical advance directive? No - Patient declined  Pre-existing out of facility DNR order (yellow form or pink MOST form) -     Chief Complaint  Patient presents with   Acute Visit    Right big toe pain    HPI:  Pt is a 85 y.o. male seen today for an acute visit for reported c/o right big toe pain, not new, at base of the toe nail region, no redness, warmth, skin open, or ingrowing toe nail noted. The dorsalis pedis pulse right foot is barely felt, the left foot is weak. Hx of anxiety, on Alprazolam 0.25mg  tid prn. CHF/edema BLE, prn Furosemide 20mg  qd. Chronic left knee pain, TKR of the right, but the patient stated he is not going to have the right knee replaced. HTN, elevated today, he denied headache, dizziness, chest pain/pressrue, or palpitation, on Diltiazem 120mg  qd, Losartan 50mg  qd.    Past Medical History:  Diagnosis Date   Anxiety    CAD S/P percutaneous coronary angioplasty 1996, 2009, 01/2010   PTCA of L Cx 1996; BMS-RCA  [facility]) 2009; Staged PCI RCA (70% ISR) --> 3.0 mm x 23 mm BMS, staged PCI Diag - 2.0 mm x 12 mm Mini Vision BMS (2.25 mm)   Cataracts, bilateral    immature   Chronic back pain    scoliosis--pt states unable to have surgery bc of age   GERD (gastroesophageal reflux disease)    takes Nexium daily   History of colon polyps    Hyperlipidemia    Hypertension    Myocardial infarction ([place]) 1996, 2009   x 2;last time was about 8-43yrs ago    Obesity (BMI 30.0-34.9)    OSA (obstructive sleep apnea)    Osteoarthritis of both knees    Peripheral edema    Venous Stasis   Persistent atrial fibrillation ([place])    Unable to maintaine NSR after DCCV with Tikosyn.  Plan = rate control w/diltiazem (beta blocker off b/c fatigue). CHA2DS2Vasc 4. DOAC - Eliquis.    Prostate cancer ([place]) 2005   seed implant   Past Surgical History:  Procedure Laterality Date   CARDIOVERSION N/A 06/12/2015   Procedure: CARDIOVERSION;  Surgeon: Sanda Klein, MD;  Location: [place] [facility];  Service: Cardiovascular;  Laterality: N/A;   CARDIOVERSION N/A 08/02/2015   Procedure: CARDIOVERSION;  Surgeon: [name], MD;  Location: [place];  Service: Cardiovascular;  Laterality: N/A;   [place], 2009, August 2011   PTCA  of L Cx 1996; BMS-RCA [facility]) 2009; Staged PCI RCA (70% ISR) --> 3.0 mm x 23 mm BMS, staged PCI Diag - 2.0 mm x 12 mm Mini Vision BMS (2.25 mm)   cyst removed  in college   NEUROPLASTY / TRANSPOSITION MEDIAN NERVE AT CARPAL TUNNEL BILATERAL     NM MYOVIEW LTD  November 2013   EF 53%; fixed mid inferior-inferolateral defect, infarct with no ischemia.   right knee arthrsoscopy  52yrs ago   right trigger finger     seeds placed into prostate   2005   TEE WITHOUT CARDIOVERSION N/A 06/12/2015   Procedure: TRANSESOPHAGEAL ECHOCARDIOGRAM (TEE) - WITH CARDIOVERSION;  Surgeon: Sanda Klein, MD;  Location: [place];   Service: Cardiovascular: EF 55-60%.No LA or LAA thrombus. Normal Peal PAP ~33 mmHg.   TOTAL KNEE ARTHROPLASTY  05/09/2012   Procedure: TOTAL KNEE ARTHROPLASTY;  Surgeon: Kerin Salen, MD;  Location: [place];  Service: Orthopedics;  Laterality: Right;   TRANSTHORACIC ECHOCARDIOGRAM  06/2015   EF 60-65%. Mild LA dilation. Normal PA pressures: 32 mmHg. Aortic sclerosis but no stenosis.   wisdom teeth extraccted      Allergies  Allergen Reactions   Crab [Shellfish Allergy] Rash   Hydrocodone Hives   Adhesive [Tape] Rash   Oxycodone Rash    Allergies as of 03/25/2019      Reactions   Crab [shellfish Allergy] Rash   Hydrocodone Hives   Adhesive [tape] Rash   Oxycodone Rash      Medication List       Accurate as of March 25, 2019  2:43 PM. If you have any questions, ask your nurse or doctor.        acetaminophen 500 MG tablet Commonly known as: TYLENOL Take 1,000 mg by mouth. Three times a day as needed   ALPRAZolam 0.25 MG tablet Commonly known as: XANAX Take 0.25 mg by mouth 3 (three) times daily as needed for anxiety.   atorvastatin 40 MG tablet Commonly known as: LIPITOR TAKE 1 TABLET DAILY AT BEDTIME   Biofreeze 4 % Gel Generic drug: Menthol (Topical Analgesic) Apply topically 4 (four) times daily.   diltiazem 120 MG tablet Commonly known as: CARDIZEM Take two tablets; oral Once A Day   Eliquis 5 MG Tabs tablet Generic drug: apixaban TAKE 1 TABLET TWICE A DAY (SCHEDULE FOLLOW UP WITH CARDIOLOGIST PRIOR TO NEXT REFILL REQUEST)   esomeprazole 40 MG capsule Commonly known as: NEXIUM TAKE 1 CAPSULE DAILY   furosemide 20 MG tablet Commonly known as: LASIX Take 1 tablet (20 mg total) by mouth daily as needed.   losartan 100 MG tablet Commonly known as: COZAAR Take 50 mg by mouth daily.   Magnesium 200 MG Tabs Take by mouth. 1 tablet once a day   MIRALAX PO Take 17 g by mouth daily as needed (Constipation).   Myrbetriq 25 MG Tb24 tablet Generic  drug: mirabegron ER Take 25 mg by mouth daily.   Nitrostat 0.4 MG SL tablet Generic drug: nitroGLYCERIN USE AS NEEDED   tamsulosin 0.4 MG Caps capsule Commonly known as: FLOMAX Take 0.4 mg by mouth at bedtime.   Tubersol 5 UNIT/0.1ML injection Generic drug: tuberculin Inject into the skin once. Start taking on: April 03, 2019   Vitamin D3 25 MCG (1000 UT) Caps Take 1 capsule by mouth daily.      ROS was provided with assistance of staff Review of Systems  Constitutional: Positive for fatigue. Negative for activity change, appetite change, chills  and diaphoresis.  HENT: Positive for hearing loss. Negative for congestion and voice change.   Respiratory: Positive for shortness of breath. Negative for cough and wheezing.   Cardiovascular: Positive for leg swelling. Negative for chest pain and palpitations.  Gastrointestinal: Negative for abdominal distention, abdominal pain, constipation, diarrhea, nausea and vomiting.  Genitourinary: Negative for difficulty urinating, dysuria and urgency.  Musculoskeletal: Positive for arthralgias and gait problem. Negative for joint swelling.       S/p TKR of the right. Left knee pain is chronic.   Skin: Negative for color change and pallor.  Neurological: Negative for dizziness, speech difficulty, weakness and headaches.  Psychiatric/Behavioral: Positive for dysphoric mood. Negative for agitation, behavioral problems, hallucinations and sleep disturbance. The patient is not nervous/anxious.     Immunization History  Administered Date(s) Administered   H1N1 04/13/2010   Influenza, High Dose Seasonal PF 02/12/2014, 03/29/2018   Influenza-Unspecified 03/06/2013, 02/12/2014, 03/06/2017   Pneumococcal Conjugate-13 02/12/2014   Pneumococcal Polysaccharide-23 02/04/2013, 02/12/2014   Td 09/04/2005   Tdap 12/11/2012   Zoster 07/07/2006   Pertinent  Health Maintenance Due  Topic Date Due   PNA vac Low Risk Adult (2 of 2 - PCV13)  02/13/2015   INFLUENZA VACCINE  01/05/2019   No flowsheet data found. Functional Status Survey:    Vitals:   03/25/19 1338  BP: (!) 150/98  Pulse: 100  Resp: 20  Temp: (!) 96.9 F (36.1 C)  SpO2: 95%  Weight: 225 lb (102.1 kg)  Height: 5\' 10"  (1.778 m)   Body mass index is 32.28 kg/m. Physical Exam Constitutional:      Appearance: Normal appearance. He is not ill-appearing, toxic-appearing or diaphoretic.  HENT:     Head: Normocephalic and atraumatic.     Nose: Nose normal.     Mouth/Throat:     Mouth: Mucous membranes are moist.  Eyes:     Extraocular Movements: Extraocular movements intact.     Conjunctiva/sclera: Conjunctivae normal.     Pupils: Pupils are equal, round, and reactive to light.  Neck:     Musculoskeletal: Normal range of motion and neck supple.  Cardiovascular:     Rate and Rhythm: Normal rate. Rhythm irregular.     Pulses:          Dorsalis pedis pulses are 1+ on the right side and 2+ on the left side.     Heart sounds: No murmur.  Pulmonary:     Breath sounds: No wheezing, rhonchi or rales.  Abdominal:     General: Bowel sounds are normal. There is no distension.     Palpations: Abdomen is soft.     Tenderness: There is no abdominal tenderness. There is no right CVA tenderness, left CVA tenderness, guarding or rebound.  Musculoskeletal:        General: Tenderness present.     Right lower leg: Edema present.     Left lower leg: Edema present.     Comments: Left knee pain with ROM. Right great toe pain at the base of the toe nail region, no apparent redness, warmth, ingrowing toe nail or s/s of paronychia.   Skin:    General: Skin is warm and dry.  Neurological:     General: No focal deficit present.     Mental Status: He is alert. Mental status is at baseline.     Cranial Nerves: No cranial nerve deficit.     Motor: No weakness.     Coordination: Coordination normal.     Gait:  Gait abnormal.  Psychiatric:        Mood and Affect: Mood  normal.        Behavior: Behavior normal.        Thought Content: Thought content normal.     Labs reviewed: Recent Labs    07/19/18 1505 01/30/19 1700  NA 139 138  K 4.3 3.8  CL 106 101  CO2 29 26  GLUCOSE 112* 143*  BUN 14 14  CREATININE 1.04 0.95  CALCIUM 8.7* 9.0  MG 1.9  --    Recent Labs    01/30/19 1700  AST 14*  ALT 9  ALKPHOS 77  BILITOT 0.4  PROT 6.9  ALBUMIN 3.2*   Recent Labs    01/30/19 1700  WBC 8.2  NEUTROABS 6.2  HGB 9.9*  HCT 33.6*  MCV 83.8  PLT 309   Lab Results  Component Value Date   TSH 2.00 02/24/2016   No results found for: HGBA1C Lab Results  Component Value Date   CHOL 155 07/29/2014   HDL 51 07/29/2014   LDLCALC 83 07/29/2014   TRIG 105 07/29/2014   CHOLHDL 2.9 02/12/2014    Significant Diagnostic Results in last 30 days:  No results found.  Assessment/Plan Great toe pain, right At the base of the toenail region, no apparent redness, swelling, ingrowing toe nail, injury in setting of faint PD pulse, will obtain ABI, apply BioFreeze qid to the right great toe, try open toe shoes. May consider uric acid if no better.   Anxiety Continue prn Alprazolam for now.   Essential hypertension Blood pressure is not well controlled, continue Losartan 50mg  qd, Diltiazem 120mg  qd. Observe.   Persistent atrial fibrillation ([place]):  CHA2DS2-VASc Score 4; On Eliquis Heart rate is in control. No change of treatment.   Pedal edema Chronic, continue prn Furosemide 20mg  daily.   Left knee pain Opted out knee replacement, pain with ROM or walking.   Diastolic CHF (HCC) SOB, O2 dependent in setting of interstitial lung disease, continue prn Furosemide, observe.      Family/ staff Communication: plan of care reviewed with the patient and charge nurse.   Labs/tests ordered:  ABI R+L  Time spend 25 minutes.

## 2019-03-25 NOTE — Assessment & Plan Note (Signed)
Heart rate is in control. No change of treatment.

## 2019-03-25 NOTE — Assessment & Plan Note (Signed)
SOB, O2 dependent in setting of interstitial lung disease, continue prn Furosemide, observe.

## 2019-03-25 NOTE — Assessment & Plan Note (Signed)
At the base of the toenail region, no apparent redness, swelling, ingrowing toe nail, injury in setting of faint PD pulse, will obtain ABI, apply BioFreeze qid to the right great toe, try open toe shoes. May consider uric acid if no better.

## 2019-03-25 NOTE — Assessment & Plan Note (Signed)
Continue prn Alprazolam for now.

## 2019-03-25 NOTE — Assessment & Plan Note (Signed)
Blood pressure is not well controlled, continue Losartan 50mg  qd, Diltiazem 120mg  qd. Observe.

## 2019-03-26 DIAGNOSIS — I10 Essential (primary) hypertension: Secondary | ICD-10-CM | POA: Diagnosis not present

## 2019-03-26 DIAGNOSIS — R52 Pain, unspecified: Secondary | ICD-10-CM | POA: Diagnosis not present

## 2019-03-26 LAB — CBC AND DIFFERENTIAL
HCT: 30 — AB (ref 41–53)
Hemoglobin: 9.4 — AB (ref 13.5–17.5)
Platelets: 357 (ref 150–399)
WBC: 8.4

## 2019-03-26 LAB — BASIC METABOLIC PANEL
BUN: 17 (ref 4–21)
CO2: 28 — AB (ref 13–22)
Chloride: 103 (ref 99–108)
Creatinine: 0.8 (ref 0.6–1.3)
Glucose: 96
Potassium: 4 (ref 3.4–5.3)
Sodium: 142 (ref 137–147)

## 2019-03-26 LAB — COMPREHENSIVE METABOLIC PANEL
Albumin: 3.5 (ref 3.5–5.0)
Calcium: 8.4 — AB (ref 8.7–10.7)
Globulin: 2.7

## 2019-03-26 LAB — CBC: RBC: 3.83 — AB (ref 3.87–5.11)

## 2019-03-26 LAB — HEPATIC FUNCTION PANEL
ALT: 4 — AB (ref 10–40)
AST: 8 — AB (ref 14–40)
Alkaline Phosphatase: 69 (ref 25–125)
Bilirubin, Total: 0.6

## 2019-03-27 ENCOUNTER — Encounter: Payer: Self-pay | Admitting: Nurse Practitioner

## 2019-03-27 ENCOUNTER — Non-Acute Institutional Stay (SKILLED_NURSING_FACILITY): Payer: Medicare Other | Admitting: Nurse Practitioner

## 2019-03-27 ENCOUNTER — Ambulatory Visit
Admission: RE | Admit: 2019-03-27 | Discharge: 2019-03-27 | Disposition: A | Discharge status: Home / Self Care | Payer: Medicare Other | Source: Ambulatory Visit | Attending: Primary Care | Admitting: Primary Care

## 2019-03-27 ENCOUNTER — Other Ambulatory Visit: Payer: Self-pay

## 2019-03-27 DIAGNOSIS — M6281 Muscle weakness (generalized): Secondary | ICD-10-CM | POA: Diagnosis not present

## 2019-03-27 DIAGNOSIS — D509 Iron deficiency anemia, unspecified: Secondary | ICD-10-CM

## 2019-03-27 DIAGNOSIS — K5900 Constipation, unspecified: Secondary | ICD-10-CM

## 2019-03-27 DIAGNOSIS — R918 Other nonspecific abnormal finding of lung field: Secondary | ICD-10-CM

## 2019-03-27 DIAGNOSIS — R29898 Other symptoms and signs involving the musculoskeletal system: Secondary | ICD-10-CM | POA: Diagnosis not present

## 2019-03-27 DIAGNOSIS — R2681 Unsteadiness on feet: Secondary | ICD-10-CM | POA: Diagnosis not present

## 2019-03-27 DIAGNOSIS — M25562 Pain in left knee: Secondary | ICD-10-CM | POA: Diagnosis not present

## 2019-03-27 DIAGNOSIS — K644 Residual hemorrhoidal skin tags: Secondary | ICD-10-CM

## 2019-03-27 DIAGNOSIS — I509 Heart failure, unspecified: Secondary | ICD-10-CM | POA: Diagnosis not present

## 2019-03-27 DIAGNOSIS — M150 Primary generalized (osteo)arthritis: Secondary | ICD-10-CM | POA: Diagnosis not present

## 2019-03-27 MED ORDER — IOHEXOL 300 MG/ML  SOLN
80.0000 mL | Freq: Once | INTRAMUSCULAR | Status: AC | PRN
  Administered 2019-03-27: 12:00:00 80 mL via INTRAVENOUS

## 2019-03-27 NOTE — Assessment & Plan Note (Signed)
Will apply 2.5% Hydrocortisone cream bid to external hemorrhoids x 2 weeks, avoid constipation.

## 2019-03-27 NOTE — Progress Notes (Signed)
@Patient  ID: Jerry [name], male    DOB: 11-13-1929, 85 y.o.   MRN: [id number]  Chief Complaint  Patient presents with   Follow-up    CT He reports his breathing has been at his baseline.     Referring provider: Alroy Dust, L.Marlou Sa, MD  HPI: 85 year old male, former smoker. PMH significant for ILD, chronic respiratory failure with hypoxia, OSA, nocturnal hypoxia, chronic pedal edema, afib, CAD. Patient of Dr. Elsworth Soho, last seen 03/29/18. Maintained on CPAP and portable oxygen. Not a candidate for anti-fiboric therapies and is not interested d/t SE's. Referred to pulmonary rehab.   Previous [facility] pulmonary encounter: 06/27/2018 Patient presents today for 3 month follow-up visit. Accompanied by his wife. Moved 1 week ago to independent living. Some difficulty/confusion keeping straight the amount of help he is getting at the facility. He has seen social workers, nurses and dieticians. Breathing has been fine, no significant change. Some fatigue with activities, O2 drops when off oxygen. Still on 2L O2 24/7. Wearing CPAP on occasion, states that it is annoying to wear. States that he can do better. Taking xanax as needed, some days he has taken it twice for anxiety.  Not currently receiving pulmonary rehab. Denies cough or wheeze.   03/29/2019 Patient presents today for 6 month follow-up. Accompanied by his wife. He is doing well, no acute complaints.  Living at an assisted living facility. It is very difficult for him to get to appointments d/t mobility. Breathing is at baseline. Uses 2L oxygen continuous. He still has chronic lower extremity edema which he takes lasix for. He is not currently wearing his cpap mask. He sleeps in a recliner, which he prefers to stay in during the day as well. Starting physical therapy at facility.   Significant testing: 11/23/18 CT chest- Spectrum of findings compatible with basilar predominant fibrotic interstitial lung disease  Without interval progression since  09/11/2017. New scattered right upper lobe vague subsolid pulmonary nodules, largest 1.0 cm, favor inflammatory. Needs Chest CT with IV contrast follow-up in 3-6 months is recommended  09/2017 HRCT- Pulmonary parenchymal pattern of mild subpleural fibrosis may be due to nonspecific interstitial pneumonitis or usual interstitial pneumonitis. 4 mm subpleural right middle lobe nodule, likely a subpleural lymph node  01/2018 PFT - FVC 2.72 (69%), FEV1 2.27 (82%), ratio 84, DLCOunc 42  Allergies  Allergen Reactions   Crab [Shellfish Allergy] Rash   Hydrocodone Hives   Adhesive [Tape] Rash   Oxycodone Rash    Immunization History  Administered Date(s) Administered   H1N1 04/13/2010   Influenza, High Dose Seasonal PF 02/12/2014, 03/29/2018   Influenza-Unspecified 03/06/2013, 02/12/2014, 03/06/2017   Pneumococcal Conjugate-13 02/12/2014   Pneumococcal Polysaccharide-23 02/04/2013, 02/12/2014   Td 09/04/2005   Tdap 12/11/2012   Zoster 07/07/2006    Past Medical History:  Diagnosis Date   Anxiety    CAD S/P percutaneous coronary angioplasty 1996, 2009, 01/2010   PTCA of L Cx 1996; BMS-RCA [facility]) 2009; Staged PCI RCA (70% ISR) --> 3.0 mm x 23 mm BMS, staged PCI Diag - 2.0 mm x 12 mm Mini Vision BMS (2.25 mm)   Cataracts, bilateral    immature   Chronic back pain    scoliosis--pt states unable to have surgery bc of age   GERD (gastroesophageal reflux disease)    takes Nexium daily   History of colon polyps    Hyperlipidemia    Hypertension    Myocardial infarction [facility]) 1996, 2009   x 2;last time was about  8-80yrs ago    Obesity (BMI 30.0-34.9)    OSA (obstructive sleep apnea)    Osteoarthritis of both knees    Peripheral edema    Venous Stasis   Persistent atrial fibrillation (HCC)    Unable to maintaine NSR after DCCV with Tikosyn.  Plan = rate control w/diltiazem (beta blocker off b/c fatigue). CHA2DS2Vasc 4. DOAC - Eliquis.    Prostate cancer ([place])  2005   seed implant    Tobacco History: Social History   Tobacco Use  Smoking Status Former Smoker   Types: Pipe   Quit date: 03/12/1984   Years since quitting: 35.0  Smokeless Tobacco Never Used  Tobacco Comment   quit smoking pipe about 50yrs ago   Counseling given: Not Answered Comment: quit smoking pipe about 60yrs ago   Outpatient Medications Prior to Visit  Medication Sig Dispense Refill   acetaminophen (TYLENOL) 500 MG tablet Take 1,000 mg by mouth. Three times a day as needed     ALPRAZolam (XANAX) 0.25 MG tablet Take 0.25 mg by mouth 3 (three) times daily as needed for anxiety.      atorvastatin (LIPITOR) 40 MG tablet TAKE 1 TABLET DAILY AT BEDTIME 90 tablet 2   Cholecalciferol (VITAMIN D3) 1000 units CAPS Take 1 capsule by mouth daily.      diltiazem (CARDIZEM) 120 MG tablet Take two tablets; oral Once A Day     ELIQUIS 5 MG TABS tablet TAKE 1 TABLET TWICE A DAY (SCHEDULE FOLLOW UP WITH CARDIOLOGIST PRIOR TO NEXT REFILL REQUEST) 180 tablet 4   esomeprazole (NEXIUM) 40 MG capsule TAKE 1 CAPSULE DAILY 90 capsule 3   furosemide (LASIX) 20 MG tablet Take 1 tablet (20 mg total) by mouth daily as needed. 90 tablet 3   losartan (COZAAR) 100 MG tablet Take 50 mg by mouth daily.     Magnesium 200 MG TABS Take by mouth. 1 tablet once a day     Menthol, Topical Analgesic, (BIOFREEZE) 4 % GEL Apply topically 4 (four) times daily.     mirabegron ER (MYRBETRIQ) 25 MG TB24 tablet Take 25 mg by mouth daily.      NITROSTAT 0.4 MG SL tablet USE AS NEEDED 25 tablet 3   Polyethylene Glycol 3350 (MIRALAX PO) Take 17 g by mouth daily as needed (Constipation).      tamsulosin (FLOMAX) 0.4 MG CAPS capsule Take 0.4 mg by mouth at bedtime.      [START ON 04/03/2019] tuberculin (TUBERSOL) 5 UNIT/0.1ML injection Inject into the skin once.     No facility-administered medications prior to visit.    Review of Systems  Review of Systems  Constitutional: Negative.     Respiratory: Negative.    Physical Exam  BP 110/78    Pulse 65    Temp 98.7 F (37.1 C) (Temporal)    SpO2 99%  Physical Exam Constitutional:      Appearance: Normal appearance.  HENT:     Head: Normocephalic and atraumatic.     Mouth/Throat:     Mouth: Mucous membranes are moist.     Pharynx: Oropharynx is clear.  Neck:     Musculoskeletal: Normal range of motion and neck supple.  Cardiovascular:     Rate and Rhythm: Normal rate and regular rhythm.  Pulmonary:     Effort: Pulmonary effort is normal. No respiratory distress.     Breath sounds: No wheezing.     Comments: Fine crackles at bases Musculoskeletal:     Right lower leg:  Edema present.     Left lower leg: Edema present.     Comments: In [facility]  Neurological:     General: No focal deficit present.     Mental Status: He is alert and oriented to person, place, and time. Mental status is at baseline.  Psychiatric:        Mood and Affect: Mood normal.        Behavior: Behavior normal.        Thought Content: Thought content normal.        Judgment: Judgment normal.      Lab Results:  CBC    Component Value Date/Time   WBC 8.2 01/30/2019 1700   RBC 4.01 (L) 01/30/2019 1700   HGB 9.9 (L) 01/30/2019 1700   HGB 14.8 12/05/2016 1208   HCT 33.6 (L) 01/30/2019 1700   HCT 42.8 12/05/2016 1208   PLT 309 01/30/2019 1700   PLT 211 12/05/2016 1208   MCV 83.8 01/30/2019 1700   MCV 96 12/05/2016 1208   MCH 24.7 (L) 01/30/2019 1700   MCHC 29.5 (L) 01/30/2019 1700   RDW 20.2 (H) 01/30/2019 1700   RDW 13.9 12/05/2016 1208   LYMPHSABS 1.1 01/30/2019 1700   MONOABS 0.6 01/30/2019 1700   EOSABS 0.2 01/30/2019 1700   BASOSABS 0.1 01/30/2019 1700    BMET    Component Value Date/Time   NA 138 01/30/2019 1700   NA 140 12/05/2016 1208   K 3.8 01/30/2019 1700   CL 101 01/30/2019 1700   CO2 26 01/30/2019 1700   GLUCOSE 143 (H) 01/30/2019 1700   BUN 14 01/30/2019 1700   BUN 15 12/05/2016 1208   CREATININE 0.95  01/30/2019 1700   CREATININE 1.14 (H) 04/07/2016 1355   CALCIUM 9.0 01/30/2019 1700   GFRNONAA >60 01/30/2019 1700   GFRAA >60 01/30/2019 1700    BNP    Component Value Date/Time   BNP 70.7 05/27/2016 2240   BNP 261.1 (H) 06/09/2015 1628    ProBNP No results found for: PROBNP  Imaging: Ct Chest W Contrast  Result Date: 03/27/2019 CLINICAL DATA:  Follow-up lung nodule. EXAM: CT CHEST WITH CONTRAST TECHNIQUE: Multidetector CT imaging of the chest was performed during intravenous contrast administration. CONTRAST:  23mL OMNIPAQUE IOHEXOL 300 MG/ML  SOLN COMPARISON:  11/23/2018 FINDINGS: Cardiovascular: Normal heart size. No pericardial effusion. 4 cm right ventricular outflow track is identified. Findings may reflect PA hypertension. Aortic atherosclerosis. Lad, left circumflex and RCA coronary artery calcifications. Mediastinum/Nodes: Normal appearance of the thyroid gland. The trachea appears patent and is midline. Normal appearance of the esophagus. Prominent mediastinal lymph nodes are identified including 1.6 cm right paratracheal lymph node, image 52/2. Previously this measured the same. 1.5 cm subcarinal lymph node is identified, image 72/2. Previously 1.3 cm. Enlarged right hilar node measures 2 cm, image 64/2. This is difficult to compare with the previous exam as the prior study was performed without IV contrast material. Lungs/Pleura: No pleural effusion identified. Diffuse bilateral interstitial reticulation is identified which has a lower lung zone predominance. Mild bronchiectasis. A mosaic attenuation pattern is noted bilaterally. Right middle lobe lung nodule is unchanged measuring 4 mm, image 93/3. right upper lobe lung nodule measuring 4 mm is stable from previous exam, image 46/3. Upper Abdomen: Unchanged 1.5 cm low-density left adrenal nodule. Previous cholecystectomy. Left upper pole kidney cysts noted. Musculoskeletal: Spondylosis within the thoracic spine. Unchanged lower  thoracic spine compression fracture IMPRESSION: 1. Unchanged right upper lobe and right middle lobe lung nodules.  No new or enlarging pulmonary nodules identified. No follow-up needed if patient is low-risk (and has no known or suspected primary neoplasm). Non-contrast chest CT can be considered at 12 months (from 11/23/2018) if patient is high-risk. This recommendation follows the consensus statement: Guidelines for Management of Incidental Pulmonary Nodules Detected on CT Images: From the Fleischner Society 2017; Radiology 2017; 284:228-243. 2. Chronic interstitial lung disease with lower lung zone predominance and mild bronchiectasis. As mentioned previously findings may reflect nonspecific interstitial pneumonitis or early usual interstitial pneumonitis. 3. Multi vessel coronary artery calcifications noted. 4. Stable enlarged mediastinal and right hilar lymph nodes. 5. Stable left adrenal nodule. 6. Stable lower thoracic spine compression fracture. Enlargement of the right ventricular outflow tract may reflect underlying PA hypertension. 7. Unchanged lower thoracic spine compression fracture. Aortic Atherosclerosis (ICD10-I70.0). Electronically Signed   By: Kerby Moors M.D.   On: 03/27/2019 15:45     Assessment & Plan:   ILD (interstitial lung disease) ([place]) - Stable, maintained on 2L oxygen  - ? UIP, neg serology. Not candiaate for antifibrotics and not intereted d/t side effects - Most recent HRCT showed a spectrum of findings compatible with basilar predominant fibrotic interstitial lung disease without frank honeycombing. No appreciable interval progression since 09/11/2017  - Continue to monitor symptoms. Recommend PFTs at next visit but patient unlikely to do them  OSA (obstructive sleep apnea) - HST moderate OSA- AHI 22/hr - Not currently wearing CPAP, encouraged compliance    Martyn Ehrich, NP 04/02/2019

## 2019-03-27 NOTE — Progress Notes (Signed)
Location:   [facility] Room Number: 5 Place of Service:  SNF (31) Provider:  Shooter Tangen NP  Alroy Dust, L.Marlou Sa, MD  Patient Care Team: Alroy Dust, Carlean Jews.Marlou Sa, MD as PCP - General (Family Medicine) Leonie Luisfernando Brightwell, MD as PCP - Cardiology (Cardiology)  Extended Emergency Contact Information Primary Emergency Contact: [facility] Address: [address]          [place], [place] [address] [name] Phone: [phone] Mobile Phone: [phone] Relation: Spouse Secondary Emergency Contact: Janey Genta Address: [address]          [place], [place] Phone: [phone] Mobile Phone: [phone] Relation: Daughter  Code Status: DNR Goals of care: Advanced Directive information Advanced Directives 10/19/2017  Does Patient Have a Medical Advance Directive? No  Type of Advance Directive -  Copy of [place] in Chart? -  Would patient like information on creating a medical advance directive? No - Patient declined  Pre-existing out of facility DNR order (yellow form or pink MOST form) -     Chief Complaint  Patient presents with   Acute Visit    Hemorrhoids    HPI:  Pt is a 85 y.o. male seen today for an acute visit for the patient has microcytic anemia, Hgb 9.9 01/30/19, 9.4, MCV 78.1, MCH 24.5 03/26/19. No apparent bleeding. C/o itching external hemorrhoids and constipation. He takes prn MiraLax for constipation.    Past Medical History:  Diagnosis Date   Anxiety    CAD S/P percutaneous coronary angioplasty 1996, 2009, 01/2010   PTCA of L Cx 1996; BMS-RCA [facility]) 2009; Staged PCI RCA (70% ISR) --> 3.0 mm x 23 mm BMS, staged PCI Diag - 2.0 mm x 12 mm Mini Vision BMS (2.25 mm)   Cataracts, bilateral    immature   Chronic back pain    scoliosis--pt states unable to have surgery bc of age   GERD (gastroesophageal reflux disease)    takes Nexium daily   History of colon polyps     Hyperlipidemia    Hypertension    Myocardial infarction ([place]) 1996, 2009   x 2;last time was about 8-22yrs ago    Obesity (BMI 30.0-34.9)    OSA (obstructive sleep apnea)    Osteoarthritis of both knees    Peripheral edema    Venous Stasis   Persistent atrial fibrillation ([place])    Unable to maintaine NSR after DCCV with Tikosyn.  Plan = rate control w/diltiazem (beta blocker off b/c fatigue). CHA2DS2Vasc 4. DOAC - Eliquis.    Prostate cancer ([place]) 2005   seed implant   Past Surgical History:  Procedure Laterality Date   CARDIOVERSION N/A 06/12/2015   Procedure: CARDIOVERSION;  Surgeon: Sanda Klein, MD;  Location: [facility];  Service: Cardiovascular;  Laterality: N/A;   CARDIOVERSION N/A 08/02/2015   Procedure: CARDIOVERSION;  Surgeon: [name], MD;  Location: [facility];  Service: Cardiovascular;  Laterality: N/A;   [place], 2009, August 2011   PTCA of L Cx 1996; BMS-RCA [facility]) 2009; Staged PCI RCA (70% ISR) --> 3.0 mm x 23 mm BMS, staged PCI Diag - 2.0 mm x 12 mm Mini Vision BMS (2.25 mm)   cyst removed  in college   NEUROPLASTY / TRANSPOSITION MEDIAN NERVE AT CARPAL TUNNEL BILATERAL     NM MYOVIEW LTD  November 2013   EF 53%; fixed mid inferior-inferolateral  defect, infarct with no ischemia.   right knee arthrsoscopy  47yrs ago   right trigger finger     seeds placed into prostate   2005   TEE WITHOUT CARDIOVERSION N/A 06/12/2015   Procedure: TRANSESOPHAGEAL ECHOCARDIOGRAM (TEE) - WITH CARDIOVERSION;  Surgeon: Sanda Klein, MD;  Location: [facility];  Service: Cardiovascular: EF 55-60%.No LA or LAA thrombus. Normal Peal PAP ~33 mmHg.   TOTAL KNEE ARTHROPLASTY  05/09/2012   Procedure: TOTAL KNEE ARTHROPLASTY;  Surgeon: Kerin Salen, MD;  Location: [facility];  Service: Orthopedics;  Laterality: Right;   TRANSTHORACIC ECHOCARDIOGRAM  06/2015   EF 60-65%. Mild LA dilation. Normal PA  pressures: 32 mmHg. Aortic sclerosis but no stenosis.   wisdom teeth extraccted      Allergies  Allergen Reactions   Crab [Shellfish Allergy] Rash   Hydrocodone Hives   Adhesive [Tape] Rash   Oxycodone Rash    Allergies as of 03/27/2019      Reactions   Crab [shellfish Allergy] Rash   Hydrocodone Hives   Adhesive [tape] Rash   Oxycodone Rash      Medication List       Accurate as of March 27, 2019  4:16 PM. If you have any questions, ask your nurse or doctor.        acetaminophen 500 MG tablet Commonly known as: TYLENOL Take 1,000 mg by mouth. Three times a day as needed   ALPRAZolam 0.25 MG tablet Commonly known as: XANAX Take 0.25 mg by mouth 3 (three) times daily as needed for anxiety.   atorvastatin 40 MG tablet Commonly known as: LIPITOR TAKE 1 TABLET DAILY AT BEDTIME   Biofreeze 4 % Gel Generic drug: Menthol (Topical Analgesic) Apply topically 4 (four) times daily.   diltiazem 120 MG tablet Commonly known as: CARDIZEM Take two tablets; oral Once A Day   Eliquis 5 MG Tabs tablet Generic drug: apixaban TAKE 1 TABLET TWICE A DAY (SCHEDULE FOLLOW UP WITH CARDIOLOGIST PRIOR TO NEXT REFILL REQUEST)   esomeprazole 40 MG capsule Commonly known as: NEXIUM TAKE 1 CAPSULE DAILY   furosemide 20 MG tablet Commonly known as: LASIX Take 1 tablet (20 mg total) by mouth daily as needed.   losartan 100 MG tablet Commonly known as: COZAAR Take 50 mg by mouth daily.   Magnesium 200 MG Tabs Take by mouth. 1 tablet once a day   MIRALAX PO Take 17 g by mouth daily as needed (Constipation).   Myrbetriq 25 MG Tb24 tablet Generic drug: mirabegron ER Take 25 mg by mouth daily.   Nitrostat 0.4 MG SL tablet Generic drug: nitroGLYCERIN USE AS NEEDED   tamsulosin 0.4 MG Caps capsule Commonly known as: FLOMAX Take 0.4 mg by mouth at bedtime.   Tubersol 5 UNIT/0.1ML injection Generic drug: tuberculin Inject into the skin once. Start taking on: April 03, 2019   Vitamin D3 25 MCG (1000 UT) Caps Take 1 capsule by mouth daily.       Review of Systems  Constitutional: Negative for activity change, appetite change, chills, diaphoresis, fatigue and fever.  HENT: Positive for hearing loss. Negative for congestion and voice change.   Eyes: Negative for visual disturbance.  Respiratory: Positive for shortness of breath. Negative for cough and wheezing.   Cardiovascular: Positive for leg swelling.  Gastrointestinal: Positive for constipation. Negative for abdominal distention, abdominal pain, diarrhea, nausea and vomiting.       Itching hemorrhoids.   Genitourinary: Negative for difficulty urinating, dysuria and urgency.  Musculoskeletal:  Positive for arthralgias and gait problem.  Skin: Negative for color change.  Neurological: Negative for dizziness, speech difficulty, weakness and headaches.  Psychiatric/Behavioral: Positive for dysphoric mood. Negative for agitation, behavioral problems, hallucinations and sleep disturbance. The patient is not nervous/anxious.     Immunization History  Administered Date(s) Administered   H1N1 04/13/2010   Influenza, High Dose Seasonal PF 02/12/2014, 03/29/2018   Influenza-Unspecified 03/06/2013, 02/12/2014, 03/06/2017   Pneumococcal Conjugate-13 02/12/2014   Pneumococcal Polysaccharide-23 02/04/2013, 02/12/2014   Td 09/04/2005   Tdap 12/11/2012   Zoster 07/07/2006   Pertinent  Health Maintenance Due  Topic Date Due   PNA vac Low Risk Adult (2 of 2 - PCV13) 02/13/2015   INFLUENZA VACCINE  01/05/2019   No flowsheet data found. Functional Status Survey:    Vitals:   03/27/19 1127  BP: 124/74  Pulse: 100  Resp: 18  Temp: (!) 97.1 F (36.2 C)  SpO2: 98%  Weight: 225 lb (102.1 kg)  Height: 5\' 10"  (1.778 m)   Body mass index is 32.28 kg/m. Physical Exam Vitals signs and nursing note reviewed.  HENT:     Head: Normocephalic and atraumatic.     Nose: Nose normal.      Mouth/Throat:     Mouth: Mucous membranes are moist.  Eyes:     Extraocular Movements: Extraocular movements intact.     Conjunctiva/sclera: Conjunctivae normal.     Pupils: Pupils are equal, round, and reactive to light.  Neck:     Musculoskeletal: Normal range of motion and neck supple.  Cardiovascular:     Rate and Rhythm: Normal rate. Rhythm irregular.     Heart sounds: No murmur.  Pulmonary:     Breath sounds: No wheezing, rhonchi or rales.  Chest:     Chest wall: No tenderness.  Abdominal:     General: Bowel sounds are normal. There is no distension.     Palpations: Abdomen is soft.     Tenderness: There is no abdominal tenderness. There is no right CVA tenderness, left CVA tenderness or guarding.     Comments: External hemorrhoids.  Musculoskeletal:        General: Tenderness present.     Right lower leg: Edema present.     Left lower leg: Edema present.     Comments: Chronic left knee pain, the right  great toe pain is improved.   Skin:    General: Skin is warm and dry.  Neurological:     General: No focal deficit present.     Mental Status: He is alert and oriented to person, place, and time. Mental status is at baseline.     Cranial Nerves: No cranial nerve deficit.     Motor: No weakness.     Coordination: Coordination normal.     Gait: Gait abnormal.  Psychiatric:        Mood and Affect: Mood normal.        Behavior: Behavior normal.        Thought Content: Thought content normal.     Labs reviewed: Recent Labs    07/19/18 1505 01/30/19 1700  NA 139 138  K 4.3 3.8  CL 106 101  CO2 29 26  GLUCOSE 112* 143*  BUN 14 14  CREATININE 1.04 0.95  CALCIUM 8.7* 9.0  MG 1.9  --    Recent Labs    01/30/19 1700  AST 14*  ALT 9  ALKPHOS 77  BILITOT 0.4  PROT 6.9  ALBUMIN 3.2*  Recent Labs    01/30/19 1700  WBC 8.2  NEUTROABS 6.2  HGB 9.9*  HCT 33.6*  MCV 83.8  PLT 309   Lab Results  Component Value Date   TSH 2.00 02/24/2016   No results  found for: HGBA1C Lab Results  Component Value Date   CHOL 155 07/29/2014   HDL 51 07/29/2014   LDLCALC 83 07/29/2014   TRIG 105 07/29/2014   CHOLHDL 2.9 02/12/2014    Significant Diagnostic Results in last 30 days:  Ct Chest W Contrast  Result Date: 03/27/2019 CLINICAL DATA:  Follow-up lung nodule. EXAM: CT CHEST WITH CONTRAST TECHNIQUE: Multidetector CT imaging of the chest was performed during intravenous contrast administration. CONTRAST:  74mL OMNIPAQUE IOHEXOL 300 MG/ML  SOLN COMPARISON:  11/23/2018 FINDINGS: Cardiovascular: Normal heart size. No pericardial effusion. 4 cm right ventricular outflow track is identified. Findings may reflect PA hypertension. Aortic atherosclerosis. Lad, left circumflex and RCA coronary artery calcifications. Mediastinum/Nodes: Normal appearance of the thyroid gland. The trachea appears patent and is midline. Normal appearance of the esophagus. Prominent mediastinal lymph nodes are identified including 1.6 cm right paratracheal lymph node, image 52/2. Previously this measured the same. 1.5 cm subcarinal lymph node is identified, image 72/2. Previously 1.3 cm. Enlarged right hilar node measures 2 cm, image 64/2. This is difficult to compare with the previous exam as the prior study was performed without IV contrast material. Lungs/Pleura: No pleural effusion identified. Diffuse bilateral interstitial reticulation is identified which has a lower lung zone predominance. Mild bronchiectasis. A mosaic attenuation pattern is noted bilaterally. Right middle lobe lung nodule is unchanged measuring 4 mm, image 93/3. right upper lobe lung nodule measuring 4 mm is stable from previous exam, image 46/3. Upper Abdomen: Unchanged 1.5 cm low-density left adrenal nodule. Previous cholecystectomy. Left upper pole kidney cysts noted. Musculoskeletal: Spondylosis within the thoracic spine. Unchanged lower thoracic spine compression fracture IMPRESSION: 1. Unchanged right upper lobe  and right middle lobe lung nodules. No new or enlarging pulmonary nodules identified. No follow-up needed if patient is low-risk (and has no known or suspected primary neoplasm). Non-contrast chest CT can be considered at 12 months (from 11/23/2018) if patient is high-risk. This recommendation follows the consensus statement: Guidelines for Management of Incidental Pulmonary Nodules Detected on CT Images: From the Fleischner Society 2017; Radiology 2017; 284:228-243. 2. Chronic interstitial lung disease with lower lung zone predominance and mild bronchiectasis. As mentioned previously findings may reflect nonspecific interstitial pneumonitis or early usual interstitial pneumonitis. 3. Multi vessel coronary artery calcifications noted. 4. Stable enlarged mediastinal and right hilar lymph nodes. 5. Stable left adrenal nodule. 6. Stable lower thoracic spine compression fracture. Enlargement of the right ventricular outflow tract may reflect underlying PA hypertension. 7. Unchanged lower thoracic spine compression fracture. Aortic Atherosclerosis (ICD10-I70.0). Electronically Signed   By: Kerby Moors M.D.   On: 03/27/2019 15:45    Assessment/Plan Hypochromic microcytic anemia No apparent blooding, will obtain CBC, Fe, FeSat, TIBC, Ferritin, Retic count, Vit B12, Folate, FOBT x3  External hemorrhoids Will apply 2.5% Hydrocortisone cream bid to external hemorrhoids x 2 weeks, avoid constipation.   Constipation Will change prn MiraLax to daily by mouth, observe.      Family/ staff Communication: plan of care reviewed with the patient and charge nurse.   Labs/tests ordered:  CBC, Fe, FeSat, TIBC, Ferritin, Retic count, Vit B12, Folate, FOBT x3  Time spend 25 minutes.

## 2019-03-27 NOTE — Assessment & Plan Note (Signed)
No apparent blooding, will obtain CBC, Fe, FeSat, TIBC, Ferritin, Retic count, Vit B12, Folate, FOBT x3

## 2019-03-27 NOTE — Assessment & Plan Note (Signed)
Will change prn MiraLax to daily by mouth, observe.

## 2019-03-28 DIAGNOSIS — M25562 Pain in left knee: Secondary | ICD-10-CM | POA: Diagnosis not present

## 2019-03-28 DIAGNOSIS — M150 Primary generalized (osteo)arthritis: Secondary | ICD-10-CM | POA: Diagnosis not present

## 2019-03-28 DIAGNOSIS — I509 Heart failure, unspecified: Secondary | ICD-10-CM | POA: Diagnosis not present

## 2019-03-28 DIAGNOSIS — R29898 Other symptoms and signs involving the musculoskeletal system: Secondary | ICD-10-CM | POA: Diagnosis not present

## 2019-03-28 DIAGNOSIS — R2681 Unsteadiness on feet: Secondary | ICD-10-CM | POA: Diagnosis not present

## 2019-03-28 DIAGNOSIS — M6281 Muscle weakness (generalized): Secondary | ICD-10-CM | POA: Diagnosis not present

## 2019-03-29 ENCOUNTER — Ambulatory Visit (INDEPENDENT_AMBULATORY_CARE_PROVIDER_SITE_OTHER): Payer: Medicare Other | Admitting: Primary Care

## 2019-03-29 ENCOUNTER — Other Ambulatory Visit: Payer: Self-pay

## 2019-03-29 ENCOUNTER — Encounter: Payer: Self-pay | Admitting: Primary Care

## 2019-03-29 DIAGNOSIS — G4733 Obstructive sleep apnea (adult) (pediatric): Secondary | ICD-10-CM

## 2019-03-29 DIAGNOSIS — R29898 Other symptoms and signs involving the musculoskeletal system: Secondary | ICD-10-CM | POA: Diagnosis not present

## 2019-03-29 DIAGNOSIS — M150 Primary generalized (osteo)arthritis: Secondary | ICD-10-CM | POA: Diagnosis not present

## 2019-03-29 DIAGNOSIS — R2681 Unsteadiness on feet: Secondary | ICD-10-CM | POA: Diagnosis not present

## 2019-03-29 DIAGNOSIS — J849 Interstitial pulmonary disease, unspecified: Secondary | ICD-10-CM | POA: Diagnosis not present

## 2019-03-29 DIAGNOSIS — M6281 Muscle weakness (generalized): Secondary | ICD-10-CM | POA: Diagnosis not present

## 2019-03-29 DIAGNOSIS — I509 Heart failure, unspecified: Secondary | ICD-10-CM | POA: Diagnosis not present

## 2019-03-29 DIAGNOSIS — M25562 Pain in left knee: Secondary | ICD-10-CM | POA: Diagnosis not present

## 2019-03-29 NOTE — Patient Instructions (Addendum)
Testing results: CT scan did not show significant progression of interstitial lung disease  Recommendations: Resume CPAP for 4 hours or more at night Use flutter valve 1-2 times daily for chest congestion Get out of bed to chair once daily, participate in physical therapy Continue 2L oxygen   Orders: HRCT in 1 year re: pulmonary nodules, ILD   Follow-up: 3 months follow-up televisit with Eustaquio Maize, NP 6 months with Dr. Elsworth Soho with pulmonary function tests

## 2019-04-01 DIAGNOSIS — M25562 Pain in left knee: Secondary | ICD-10-CM | POA: Diagnosis not present

## 2019-04-01 DIAGNOSIS — M150 Primary generalized (osteo)arthritis: Secondary | ICD-10-CM | POA: Diagnosis not present

## 2019-04-01 DIAGNOSIS — R29898 Other symptoms and signs involving the musculoskeletal system: Secondary | ICD-10-CM | POA: Diagnosis not present

## 2019-04-01 DIAGNOSIS — R2681 Unsteadiness on feet: Secondary | ICD-10-CM | POA: Diagnosis not present

## 2019-04-01 DIAGNOSIS — I509 Heart failure, unspecified: Secondary | ICD-10-CM | POA: Diagnosis not present

## 2019-04-01 DIAGNOSIS — M6281 Muscle weakness (generalized): Secondary | ICD-10-CM | POA: Diagnosis not present

## 2019-04-02 ENCOUNTER — Encounter: Payer: Self-pay | Admitting: Primary Care

## 2019-04-02 DIAGNOSIS — I509 Heart failure, unspecified: Secondary | ICD-10-CM | POA: Diagnosis not present

## 2019-04-02 DIAGNOSIS — B351 Tinea unguium: Secondary | ICD-10-CM | POA: Diagnosis not present

## 2019-04-02 DIAGNOSIS — R29898 Other symptoms and signs involving the musculoskeletal system: Secondary | ICD-10-CM | POA: Diagnosis not present

## 2019-04-02 DIAGNOSIS — M150 Primary generalized (osteo)arthritis: Secondary | ICD-10-CM | POA: Diagnosis not present

## 2019-04-02 DIAGNOSIS — M25562 Pain in left knee: Secondary | ICD-10-CM | POA: Diagnosis not present

## 2019-04-02 DIAGNOSIS — K648 Other hemorrhoids: Secondary | ICD-10-CM | POA: Diagnosis not present

## 2019-04-02 DIAGNOSIS — R2681 Unsteadiness on feet: Secondary | ICD-10-CM | POA: Diagnosis not present

## 2019-04-02 DIAGNOSIS — D649 Anemia, unspecified: Secondary | ICD-10-CM | POA: Diagnosis not present

## 2019-04-02 DIAGNOSIS — Z79899 Other long term (current) drug therapy: Secondary | ICD-10-CM | POA: Diagnosis not present

## 2019-04-02 DIAGNOSIS — M79671 Pain in right foot: Secondary | ICD-10-CM | POA: Diagnosis not present

## 2019-04-02 DIAGNOSIS — I4891 Unspecified atrial fibrillation: Secondary | ICD-10-CM | POA: Diagnosis not present

## 2019-04-02 DIAGNOSIS — M79672 Pain in left foot: Secondary | ICD-10-CM | POA: Diagnosis not present

## 2019-04-02 DIAGNOSIS — D521 Drug-induced folate deficiency anemia: Secondary | ICD-10-CM | POA: Diagnosis not present

## 2019-04-02 DIAGNOSIS — Z03818 Encounter for observation for suspected exposure to other biological agents ruled out: Secondary | ICD-10-CM | POA: Diagnosis not present

## 2019-04-02 DIAGNOSIS — M6281 Muscle weakness (generalized): Secondary | ICD-10-CM | POA: Diagnosis not present

## 2019-04-02 DIAGNOSIS — I10 Essential (primary) hypertension: Secondary | ICD-10-CM | POA: Diagnosis not present

## 2019-04-02 NOTE — Assessment & Plan Note (Signed)
-   HST moderate OSA- AHI 22/hr - Not currently wearing CPAP, encouraged compliance

## 2019-04-02 NOTE — Assessment & Plan Note (Addendum)
-   Stable, maintained on 2L oxygen  - ? UIP, neg serology. Not candiaate for antifibrotics and not intereted d/t side effects - Most recent HRCT showed a spectrum of findings compatible with basilar predominant fibrotic interstitial lung disease without frank honeycombing. No appreciable interval progression since 09/11/2017  - Continue to monitor symptoms. Recommend PFTs at next visit but patient unlikely to do them

## 2019-04-03 ENCOUNTER — Encounter: Payer: Self-pay | Admitting: Nurse Practitioner

## 2019-04-03 LAB — CBC AND DIFFERENTIAL
HCT: 31 — AB (ref 41–53)
Hemoglobin: 9.6 — AB (ref 13.5–17.5)
Neutrophils Absolute: 7417
Platelets: 327 (ref 150–399)
WBC: 9.4

## 2019-04-03 LAB — VITAMIN B12: Vitamin B-12: 335

## 2019-04-03 LAB — IRON,TIBC AND FERRITIN PANEL
%SAT: 8
Ferritin: 32
Iron: 30

## 2019-04-03 LAB — CBC: RBC: 3.94 (ref 3.87–5.11)

## 2019-04-04 DIAGNOSIS — M150 Primary generalized (osteo)arthritis: Secondary | ICD-10-CM | POA: Diagnosis not present

## 2019-04-04 DIAGNOSIS — R29898 Other symptoms and signs involving the musculoskeletal system: Secondary | ICD-10-CM | POA: Diagnosis not present

## 2019-04-04 DIAGNOSIS — R2681 Unsteadiness on feet: Secondary | ICD-10-CM | POA: Diagnosis not present

## 2019-04-04 DIAGNOSIS — M25562 Pain in left knee: Secondary | ICD-10-CM | POA: Diagnosis not present

## 2019-04-04 DIAGNOSIS — I509 Heart failure, unspecified: Secondary | ICD-10-CM | POA: Diagnosis not present

## 2019-04-04 DIAGNOSIS — M6281 Muscle weakness (generalized): Secondary | ICD-10-CM | POA: Diagnosis not present

## 2019-04-08 DIAGNOSIS — I509 Heart failure, unspecified: Secondary | ICD-10-CM | POA: Diagnosis not present

## 2019-04-08 DIAGNOSIS — M150 Primary generalized (osteo)arthritis: Secondary | ICD-10-CM | POA: Diagnosis not present

## 2019-04-08 DIAGNOSIS — R29898 Other symptoms and signs involving the musculoskeletal system: Secondary | ICD-10-CM | POA: Diagnosis not present

## 2019-04-08 DIAGNOSIS — I4891 Unspecified atrial fibrillation: Secondary | ICD-10-CM | POA: Diagnosis not present

## 2019-04-08 DIAGNOSIS — M6281 Muscle weakness (generalized): Secondary | ICD-10-CM | POA: Diagnosis not present

## 2019-04-08 DIAGNOSIS — R2681 Unsteadiness on feet: Secondary | ICD-10-CM | POA: Diagnosis not present

## 2019-04-08 DIAGNOSIS — M25562 Pain in left knee: Secondary | ICD-10-CM | POA: Diagnosis not present

## 2019-04-09 DIAGNOSIS — R2681 Unsteadiness on feet: Secondary | ICD-10-CM | POA: Diagnosis not present

## 2019-04-09 DIAGNOSIS — M150 Primary generalized (osteo)arthritis: Secondary | ICD-10-CM | POA: Diagnosis not present

## 2019-04-09 DIAGNOSIS — R29898 Other symptoms and signs involving the musculoskeletal system: Secondary | ICD-10-CM | POA: Diagnosis not present

## 2019-04-09 DIAGNOSIS — Z20828 Contact with and (suspected) exposure to other viral communicable diseases: Secondary | ICD-10-CM | POA: Diagnosis not present

## 2019-04-09 DIAGNOSIS — M6281 Muscle weakness (generalized): Secondary | ICD-10-CM | POA: Diagnosis not present

## 2019-04-09 DIAGNOSIS — I4891 Unspecified atrial fibrillation: Secondary | ICD-10-CM | POA: Diagnosis not present

## 2019-04-09 DIAGNOSIS — M25562 Pain in left knee: Secondary | ICD-10-CM | POA: Diagnosis not present

## 2019-04-10 DIAGNOSIS — M6281 Muscle weakness (generalized): Secondary | ICD-10-CM | POA: Diagnosis not present

## 2019-04-10 DIAGNOSIS — M25562 Pain in left knee: Secondary | ICD-10-CM | POA: Diagnosis not present

## 2019-04-10 DIAGNOSIS — M150 Primary generalized (osteo)arthritis: Secondary | ICD-10-CM | POA: Diagnosis not present

## 2019-04-10 DIAGNOSIS — R29898 Other symptoms and signs involving the musculoskeletal system: Secondary | ICD-10-CM | POA: Diagnosis not present

## 2019-04-10 DIAGNOSIS — I4891 Unspecified atrial fibrillation: Secondary | ICD-10-CM | POA: Diagnosis not present

## 2019-04-10 DIAGNOSIS — R2681 Unsteadiness on feet: Secondary | ICD-10-CM | POA: Diagnosis not present

## 2019-04-11 ENCOUNTER — Non-Acute Institutional Stay (SKILLED_NURSING_FACILITY): Payer: Medicare Other | Admitting: Internal Medicine

## 2019-04-11 DIAGNOSIS — R600 Localized edema: Secondary | ICD-10-CM

## 2019-04-11 DIAGNOSIS — I4819 Other persistent atrial fibrillation: Secondary | ICD-10-CM | POA: Diagnosis not present

## 2019-04-11 DIAGNOSIS — I5030 Unspecified diastolic (congestive) heart failure: Secondary | ICD-10-CM

## 2019-04-11 DIAGNOSIS — G4733 Obstructive sleep apnea (adult) (pediatric): Secondary | ICD-10-CM | POA: Diagnosis not present

## 2019-04-11 DIAGNOSIS — J849 Interstitial pulmonary disease, unspecified: Secondary | ICD-10-CM | POA: Diagnosis not present

## 2019-04-11 DIAGNOSIS — F419 Anxiety disorder, unspecified: Secondary | ICD-10-CM

## 2019-04-11 DIAGNOSIS — M150 Primary generalized (osteo)arthritis: Secondary | ICD-10-CM | POA: Diagnosis not present

## 2019-04-11 DIAGNOSIS — K644 Residual hemorrhoidal skin tags: Secondary | ICD-10-CM

## 2019-04-11 DIAGNOSIS — M25562 Pain in left knee: Secondary | ICD-10-CM | POA: Diagnosis not present

## 2019-04-11 DIAGNOSIS — M6281 Muscle weakness (generalized): Secondary | ICD-10-CM | POA: Diagnosis not present

## 2019-04-11 DIAGNOSIS — R29898 Other symptoms and signs involving the musculoskeletal system: Secondary | ICD-10-CM | POA: Diagnosis not present

## 2019-04-11 DIAGNOSIS — I4891 Unspecified atrial fibrillation: Secondary | ICD-10-CM | POA: Diagnosis not present

## 2019-04-11 DIAGNOSIS — R2681 Unsteadiness on feet: Secondary | ICD-10-CM | POA: Diagnosis not present

## 2019-04-11 NOTE — Progress Notes (Signed)
Location: [facility] manager of Service:  SNF (31)  Provider:   Code Status: Goals of Care:  Advanced Directives 10/19/2017  Does Patient Have a Medical Advance Directive? No  Type of Advance Directive -  Copy of [place] in Chart? -  Would patient like information on creating a medical advance directive? No - Patient declined  Pre-existing out of facility DNR order (yellow form or pink MOST form) -     Chief Complaint  Patient presents with   Acute Visit    HPI: Patient is a 85 y.o. male seen today for an acute visit for Follow up and c/o Skin tear in Anal area Anxiety and CPAP adjustement  Patient has a history of CAD s/p PTCA, chronic atrial fibrillation on Eliquis, chronic diastolic CHF,Interstitial lung disease with hypoxia on chronic oxygen, hypertension, hyperlipidemia, OSA on CPAP, chronic back pain with spinal stenosis, S C-spine kyphosis, cognitive impairment Patient [place] was needing more Nursing Care Nurses wanted me to evaluate patient because he had skin tear in his anal area and also has external hemorrhoids. Patient is working with therapy and is able to get out and walk with mild assist. He did not have any complaints today.  Continues to be dependent for his ADLs.  Sleeps in his recliner. Not Mobile without help Denied any problems with his hemorrhoids He does use Xanax PRN for Anxiety Nurses wanted me to give them CPAP Settings as patient has not been using it at night. He doe snot seem to be very keen to use it   Past Medical History:  Diagnosis Date   Anxiety    CAD S/P percutaneous coronary angioplasty 1996, 2009, 01/2010   PTCA of L Cx 1996; BMS-RCA [facility]) 2009; Staged PCI RCA (70% ISR) --> 3.0 mm x 23 mm BMS, staged PCI Diag - 2.0 mm x 12 mm Mini Vision BMS (2.25 mm)   Cataracts, bilateral    immature   Chronic back pain    scoliosis--pt states unable to have surgery bc of age    GERD (gastroesophageal reflux disease)    takes Nexium daily   History of colon polyps    Hyperlipidemia    Hypertension    Myocardial infarction ([place]) 1996, 2009   x 2;last time was about 8-11yrs ago    Obesity (BMI 30.0-34.9)    OSA (obstructive sleep apnea)    Osteoarthritis of both knees    Peripheral edema    Venous Stasis   Persistent atrial fibrillation ([place])    Unable to maintaine NSR after DCCV with Tikosyn.  Plan = rate control w/diltiazem (beta blocker off b/c fatigue). CHA2DS2Vasc 4. DOAC - Eliquis.    Prostate cancer ([place]) 2005   seed implant    Past Surgical History:  Procedure Laterality Date   CARDIOVERSION N/A 06/12/2015   Procedure: CARDIOVERSION;  Surgeon: Sanda Klein, MD;  Location: [place] [facility]OS[facility];  Service: Cardiovascular;  Laterality: N/A;   CARDIOVERSION N/A 08/02/2015   Procedure: CARDIOVERSION;  Surgeon: [name], MD;  Location: [facility];  Service: Cardiovascular;  Laterality: N/A;   [place], 2009, August 2011   PTCA of L Cx 1996; BMS-RCA [facility]) 2009; Staged PCI RCA (70% ISR) --> 3.0 mm x 23 mm BMS, staged PCI Diag - 2.0 mm x 12 mm Mini Vision BMS (2.25 mm)   cyst  removed  in college   NEUROPLASTY / TRANSPOSITION MEDIAN NERVE AT CARPAL TUNNEL BILATERAL     [facility] MY[facility]  November 2013   EF 53%; fixed mid inferior-inferolateral defect, infarct with no ischemia.   right knee arthrsoscopy  74yrs ago   right trigger finger     seeds placed into prostate   2005   TEE WITHOUT CARDIOVERSION N/A 06/12/2015   Procedure: TRANSESOPHAGEAL ECHOCARDIOGRAM (TEE) - WITH CARDIOVERSION;  Surgeon: Sanda Klein, MD;  Location: [facility];  Service: Cardiovascular: EF 55-60%.No LA or LAA thrombus. Normal Peal PAP ~33 mmHg.   TOTAL KNEE ARTHROPLASTY  05/09/2012   Procedure: TOTAL KNEE ARTHROPLASTY;  Surgeon: Kerin Salen, MD;  Location: [facility];  Service: Orthopedics;   Laterality: Right;   TRANSTHORACIC ECHOCARDIOGRAM  06/2015   EF 60-65%. Mild LA dilation. Normal PA pressures: 32 mmHg. Aortic sclerosis but no stenosis.   wisdom teeth extraccted      Allergies  Allergen Reactions   Crab [Shellfish Allergy] Rash   Hydrocodone Hives   Adhesive [Tape] Rash   Oxycodone Rash    Outpatient Encounter Medications as of 04/11/2019  Medication Sig   acetaminophen (TYLENOL) 500 MG tablet Take 1,000 mg by mouth. Three times a day as needed   ALPRAZolam (XANAX) 0.25 MG tablet Take 0.25 mg by mouth 3 (three) times daily as needed for anxiety.    atorvastatin (LIPITOR) 40 MG tablet TAKE 1 TABLET DAILY AT BEDTIME   Cholecalciferol (VITAMIN D3) 1000 units CAPS Take 1 capsule by mouth daily.    diltiazem (CARDIZEM) 120 MG tablet Take two tablets; oral Once A Day   ELIQUIS 5 MG TABS tablet TAKE 1 TABLET TWICE A DAY (SCHEDULE FOLLOW UP WITH CARDIOLOGIST PRIOR TO NEXT REFILL REQUEST)   esomeprazole (NEXIUM) 40 MG capsule TAKE 1 CAPSULE DAILY   furosemide (LASIX) 20 MG tablet Take 1 tablet (20 mg total) by mouth daily as needed. (Patient taking differently: Take 20 mg by mouth daily. )   losartan (COZAAR) 100 MG tablet Take 50 mg by mouth daily.   Magnesium 200 MG TABS Take by mouth. 1 tablet once a day   Menthol, Topical Analgesic, (BIOFREEZE) 4 % GEL Apply topically 4 (four) times daily.   mirabegron ER (MYRBETRIQ) 25 MG TB24 tablet Take 25 mg by mouth daily.    NITROSTAT 0.4 MG SL tablet USE AS NEEDED   Polyethylene Glycol 3350 (MIRALAX PO) Take 17 g by mouth daily as needed (Constipation).    tamsulosin (FLOMAX) 0.4 MG CAPS capsule Take 0.4 mg by mouth at bedtime.    No facility-administered encounter medications on file as of 04/11/2019.     Review of Systems:  Review of Systems  Constitutional: Negative.   HENT: Negative.   Respiratory: Positive for shortness of breath.   Cardiovascular: Positive for leg swelling.  Gastrointestinal:  Negative.   Genitourinary: Negative.   Musculoskeletal: Positive for arthralgias.  Neurological: Positive for weakness.  Psychiatric/Behavioral: Negative.   All other systems reviewed and are negative.     Health Maintenance  Topic Date Due   PNA vac Low Risk Adult (2 of 2 - PCV13) 02/13/2015   INFLUENZA VACCINE  01/05/2019   TETANUS/TDAP  12/12/2022    Physical Exam: Vitals:   04/12/19 0924  BP: 120/72  Pulse: 66  Resp: 18  Temp: (!) 97.3 F (36.3 C)  SpO2: 94%   There is no height or weight on file to calculate BMI. Physical Exam Vitals signs reviewed.  Constitutional:  Appearance: He is obese.  HENT:     Head: Normocephalic.     Nose: Nose normal.     Mouth/Throat:     Mouth: Mucous membranes are moist.     Pharynx: Oropharynx is clear.  Eyes:     Pupils: Pupils are equal, round, and reactive to light.  Neck:     Musculoskeletal: Neck supple.  Cardiovascular:     Rate and Rhythm: Normal rate and regular rhythm.     Pulses: Normal pulses.     Heart sounds: Murmur present.  Pulmonary:     Effort: Pulmonary effort is normal. No respiratory distress.     Breath sounds: No wheezing.  Abdominal:     General: Abdomen is flat. Bowel sounds are normal.     Palpations: Abdomen is soft.     Comments: Has Skin Tear in Perianal area. Hemorrhoids mostly External not Inflamed. No Bleeding per Patient   Musculoskeletal:        General: Swelling present.  Skin:    General: Skin is warm and dry.  Neurological:     Mental Status: He is alert.     Labs reviewed: Basic Metabolic Panel: Recent Labs    07/19/18 1505 01/30/19 1700  NA 139 138  K 4.3 3.8  CL 106 101  CO2 29 26  GLUCOSE 112* 143*  BUN 14 14  CREATININE 1.04 0.95  CALCIUM 8.7* 9.0  MG 1.9  --    Liver Function Tests: Recent Labs    01/30/19 1700  AST 14*  ALT 9  ALKPHOS 77  BILITOT 0.4  PROT 6.9  ALBUMIN 3.2*   No results for input(s): LIPASE, AMYLASE in the last 8760 hours. No  results for input(s): AMMONIA in the last 8760 hours. CBC: Recent Labs    01/30/19 1700  WBC 8.2  NEUTROABS 6.2  HGB 9.9*  HCT 33.6*  MCV 83.8  PLT 309   Lipid Panel: No results for input(s): CHOL, HDL, LDLCALC, TRIG, CHOLHDL, LDLDIRECT in the last 8760 hours. No results found for: HGBA1C  Procedures since last visit: Ct Chest W Contrast  Result Date: 03/27/2019 CLINICAL DATA:  Follow-up lung nodule. EXAM: CT CHEST WITH CONTRAST TECHNIQUE: Multidetector CT imaging of the chest was performed during intravenous contrast administration. CONTRAST:  49mL OMNIPAQUE IOHEXOL 300 MG/ML  SOLN COMPARISON:  11/23/2018 FINDINGS: Cardiovascular: Normal heart size. No pericardial effusion. 4 cm right ventricular outflow track is identified. Findings may reflect PA hypertension. Aortic atherosclerosis. Lad, left circumflex and RCA coronary artery calcifications. Mediastinum/Nodes: Normal appearance of the thyroid gland. The trachea appears patent and is midline. Normal appearance of the esophagus. Prominent mediastinal lymph nodes are identified including 1.6 cm right paratracheal lymph node, image 52/2. Previously this measured the same. 1.5 cm subcarinal lymph node is identified, image 72/2. Previously 1.3 cm. Enlarged right hilar node measures 2 cm, image 64/2. This is difficult to compare with the previous exam as the prior study was performed without IV contrast material. Lungs/Pleura: No pleural effusion identified. Diffuse bilateral interstitial reticulation is identified which has a lower lung zone predominance. Mild bronchiectasis. A mosaic attenuation pattern is noted bilaterally. Right middle lobe lung nodule is unchanged measuring 4 mm, image 93/3. right upper lobe lung nodule measuring 4 mm is stable from previous exam, image 46/3. Upper Abdomen: Unchanged 1.5 cm low-density left adrenal nodule. Previous cholecystectomy. Left upper pole kidney cysts noted. Musculoskeletal: Spondylosis within the  thoracic spine. Unchanged lower thoracic spine compression fracture IMPRESSION: 1. Unchanged right  upper lobe and right middle lobe lung nodules. No new or enlarging pulmonary nodules identified. No follow-up needed if patient is low-risk (and has no known or suspected primary neoplasm). Non-contrast chest CT can be considered at 12 months (from 11/23/2018) if patient is high-risk. This recommendation follows the consensus statement: Guidelines for Management of Incidental Pulmonary Nodules Detected on CT Images: From the Fleischner Society 2017; Radiology 2017; 284:228-243. 2. Chronic interstitial lung disease with lower lung zone predominance and mild bronchiectasis. As mentioned previously findings may reflect nonspecific interstitial pneumonitis or early usual interstitial pneumonitis. 3. Multi vessel coronary artery calcifications noted. 4. Stable enlarged mediastinal and right hilar lymph nodes. 5. Stable left adrenal nodule. 6. Stable lower thoracic spine compression fracture. Enlargement of the right ventricular outflow tract may reflect underlying PA hypertension. 7. Unchanged lower thoracic spine compression fracture. Aortic Atherosclerosis (ICD10-I70.0). Electronically Signed   By: Kerby Moors M.D.   On: 03/27/2019 15:45    Assessment/Plan Persistent atrial fibrillation ([place]):  CHA2DS2-VASc Score 4; On Eliquis On Cardizem Rate Controlled Diastolic congestive heart failure, unspecified HF chronicity (HCC)\ Continue on Lasix BMP Stable in facility  ILD (interstitial lung disease)  Seems to be stable On Chronic oxygen Follows with Pulmonology Has a repeat CT scan pending due to some pulmonary nodules seen on the last CT  External hemorrhoids Anusol PRN  Anxiety Not Candidate for Taper Continue PRN for Now Reval after 2 weeks in facility OSA (obstructive sleep apnea) CPAP setting Given to Nurses Patient does not seem to be interested in using it  CAD S/P percutaneous coronary  angioplasty Follows with cardiology On statin Hyperlipidemia On statin Repeat Lipid Panel Urinary Incontinence ON Flomax and Myrbetriq Cognitive Impairment MMSE pending Anemia Repeat CBC pending Labs/tests ordered:  * No order type specified * Next appt:  Visit date not found Total time spent in this patient care encounter was  45_  minutes; greater than 50% of the visit spent counseling patient and staff, reviewing records , Labs and coordinating care for problems addressed at this encounter.

## 2019-04-12 ENCOUNTER — Encounter: Payer: Self-pay | Admitting: Internal Medicine

## 2019-04-12 MED ORDER — FLUTTER DEVI: 0 refills | Status: DC

## 2019-04-12 NOTE — Addendum Note (Signed)
Addended by: Vivia Ewing on: 04/12/2019 04:52 PM   Modules accepted: Orders

## 2019-04-15 DIAGNOSIS — M150 Primary generalized (osteo)arthritis: Secondary | ICD-10-CM | POA: Diagnosis not present

## 2019-04-15 DIAGNOSIS — M25562 Pain in left knee: Secondary | ICD-10-CM | POA: Diagnosis not present

## 2019-04-15 DIAGNOSIS — M6281 Muscle weakness (generalized): Secondary | ICD-10-CM | POA: Diagnosis not present

## 2019-04-15 DIAGNOSIS — I4891 Unspecified atrial fibrillation: Secondary | ICD-10-CM | POA: Diagnosis not present

## 2019-04-15 DIAGNOSIS — R29898 Other symptoms and signs involving the musculoskeletal system: Secondary | ICD-10-CM | POA: Diagnosis not present

## 2019-04-15 DIAGNOSIS — R2681 Unsteadiness on feet: Secondary | ICD-10-CM | POA: Diagnosis not present

## 2019-04-16 DIAGNOSIS — M25562 Pain in left knee: Secondary | ICD-10-CM | POA: Diagnosis not present

## 2019-04-16 DIAGNOSIS — R29898 Other symptoms and signs involving the musculoskeletal system: Secondary | ICD-10-CM | POA: Diagnosis not present

## 2019-04-16 DIAGNOSIS — I4891 Unspecified atrial fibrillation: Secondary | ICD-10-CM | POA: Diagnosis not present

## 2019-04-16 DIAGNOSIS — R2681 Unsteadiness on feet: Secondary | ICD-10-CM | POA: Diagnosis not present

## 2019-04-16 DIAGNOSIS — M6281 Muscle weakness (generalized): Secondary | ICD-10-CM | POA: Diagnosis not present

## 2019-04-16 DIAGNOSIS — R3915 Urgency of urination: Secondary | ICD-10-CM | POA: Diagnosis not present

## 2019-04-16 DIAGNOSIS — C61 Malignant neoplasm of prostate: Secondary | ICD-10-CM | POA: Diagnosis not present

## 2019-04-16 DIAGNOSIS — M150 Primary generalized (osteo)arthritis: Secondary | ICD-10-CM | POA: Diagnosis not present

## 2019-04-16 DIAGNOSIS — Z03818 Encounter for observation for suspected exposure to other biological agents ruled out: Secondary | ICD-10-CM | POA: Diagnosis not present

## 2019-04-17 DIAGNOSIS — R29898 Other symptoms and signs involving the musculoskeletal system: Secondary | ICD-10-CM | POA: Diagnosis not present

## 2019-04-17 DIAGNOSIS — M150 Primary generalized (osteo)arthritis: Secondary | ICD-10-CM | POA: Diagnosis not present

## 2019-04-17 DIAGNOSIS — M25562 Pain in left knee: Secondary | ICD-10-CM | POA: Diagnosis not present

## 2019-04-17 DIAGNOSIS — I4891 Unspecified atrial fibrillation: Secondary | ICD-10-CM | POA: Diagnosis not present

## 2019-04-17 DIAGNOSIS — R2681 Unsteadiness on feet: Secondary | ICD-10-CM | POA: Diagnosis not present

## 2019-04-17 DIAGNOSIS — M6281 Muscle weakness (generalized): Secondary | ICD-10-CM | POA: Diagnosis not present

## 2019-04-18 DIAGNOSIS — M6281 Muscle weakness (generalized): Secondary | ICD-10-CM | POA: Diagnosis not present

## 2019-04-18 DIAGNOSIS — R29898 Other symptoms and signs involving the musculoskeletal system: Secondary | ICD-10-CM | POA: Diagnosis not present

## 2019-04-18 DIAGNOSIS — M150 Primary generalized (osteo)arthritis: Secondary | ICD-10-CM | POA: Diagnosis not present

## 2019-04-18 DIAGNOSIS — R2681 Unsteadiness on feet: Secondary | ICD-10-CM | POA: Diagnosis not present

## 2019-04-18 DIAGNOSIS — I4891 Unspecified atrial fibrillation: Secondary | ICD-10-CM | POA: Diagnosis not present

## 2019-04-18 DIAGNOSIS — M25562 Pain in left knee: Secondary | ICD-10-CM | POA: Diagnosis not present

## 2019-04-19 ENCOUNTER — Encounter: Payer: Self-pay | Admitting: Nurse Practitioner

## 2019-04-19 ENCOUNTER — Non-Acute Institutional Stay (SKILLED_NURSING_FACILITY): Payer: Medicare Other | Admitting: Nurse Practitioner

## 2019-04-19 DIAGNOSIS — G3184 Mild cognitive impairment, so stated: Secondary | ICD-10-CM | POA: Diagnosis not present

## 2019-04-19 DIAGNOSIS — R29898 Other symptoms and signs involving the musculoskeletal system: Secondary | ICD-10-CM | POA: Diagnosis not present

## 2019-04-19 DIAGNOSIS — I4819 Other persistent atrial fibrillation: Secondary | ICD-10-CM

## 2019-04-19 DIAGNOSIS — F419 Anxiety disorder, unspecified: Secondary | ICD-10-CM

## 2019-04-19 DIAGNOSIS — I5030 Unspecified diastolic (congestive) heart failure: Secondary | ICD-10-CM | POA: Diagnosis not present

## 2019-04-19 DIAGNOSIS — M6281 Muscle weakness (generalized): Secondary | ICD-10-CM | POA: Diagnosis not present

## 2019-04-19 DIAGNOSIS — M150 Primary generalized (osteo)arthritis: Secondary | ICD-10-CM | POA: Diagnosis not present

## 2019-04-19 DIAGNOSIS — I4891 Unspecified atrial fibrillation: Secondary | ICD-10-CM | POA: Diagnosis not present

## 2019-04-19 DIAGNOSIS — G4733 Obstructive sleep apnea (adult) (pediatric): Secondary | ICD-10-CM | POA: Diagnosis not present

## 2019-04-19 DIAGNOSIS — R2681 Unsteadiness on feet: Secondary | ICD-10-CM | POA: Diagnosis not present

## 2019-04-19 DIAGNOSIS — M25562 Pain in left knee: Secondary | ICD-10-CM | POA: Diagnosis not present

## 2019-04-19 NOTE — Assessment & Plan Note (Signed)
Mild, MMSE 24/30, passed clock drawing test, continue SNF [facility] for safety, care assistance, observe

## 2019-04-19 NOTE — Progress Notes (Signed)
Location:   [facility] [place] Room Number: 5 Place of Service:  SNF (31) Provider:  Jamail Cullers NP Alroy Dust, L.Marlou Sa, MD  Patient Care Team: Alroy Dust, Carlean Jews.Marlou Sa, MD as PCP - General (Family Medicine) Leonie Neo Yepiz, MD as PCP - Cardiology (Cardiology)  Extended Emergency Contact Information Primary Emergency Contact: [facility] Address: [address]          [place], [place] [address] [name] of [place] Phone: [phone] Mobile Phone: [phone] Relation: Spouse Secondary Emergency Contact: Janey Genta Address: [address]          [place], [place] Phone: [phone] Mobile Phone: [phone] Relation: Daughter  Code Status:  Full Code Goals of care: Advanced Directive information Advanced Directives 10/19/2017  Does Patient Have a Medical Advance Directive? No  Type of Advance Directive -  Copy of [place] in Chart? -  Would patient like information on creating a medical advance directive? No - Patient declined  Pre-existing out of facility DNR order (yellow form or pink MOST form) -     Chief Complaint  Patient presents with  . Acute Visit    Anxiety    HPI:  Pt is a 85 y.o. male seen today for an acute visit for reported the patient has increased anxiety. The patient stated he is adjusting to SNF [facility], feels overwhelmed, but he sleeps and eats at his baseline. Prn Xanax 0.36m tid used 5x/2 weeks. He just needs take his own pace to process the change. Sleep apnea, refuses use of CPAP, stated It makes him more anxious, nervous, he stated he will use it when he is ready for it, he is fully understand the risk vs benefit. MMSE 24/30 04/17/19, passed the clock drawing test. Hx of CHF/chronic edema BLE, O2 dependent, pm Furosemide 442mqd. AFib, heart rate is in control, on Diltiazem 12048md, Eliquis 5mg52md.     Past Medical History:  Diagnosis Date  . Anxiety   . CAD S/P percutaneous coronary  angioplasty 1996, 2009, 01/2010   PTCA of L Cx 1996; [facility]; Staged PCI RCA (70% ISR) --> 3.0 mm x 23 mm BMS, staged PCI Diag - 2.0 mm x 12 mm Mini Vision BMS (2.25 mm)  . Cataracts, bilateral    immature  . Chronic back pain    scoliosis--pt states unable to have surgery bc of age  . GERD (gastroesophageal reflux disease)    takes Nexium daily  . History of colon polyps   . Hyperlipidemia   . Hypertension   . [facility]   x 2;last time was about 8-98yr50yr   . Obesity (BMI 30.0-34.9)   . OSA (obstructive sleep apnea)   . Osteoarthritis of both knees   . Peripheral edema    Venous Stasis  . Persistent atrial fibrillation (HCC)    Unable to maintaine NSR after DCCV with Tikosyn.  Plan = rate control w/diltiazem (beta blocker off b/c fatigue). CHA2DS2Vasc 4. DOAC - Eliquis.   . Prostate cancer (HCC) Blyn5   seed implant   Past Surgical History:  Procedure Laterality Date  . CARDIOVERSION N/A 06/12/2015   Procedure: CARDIOVERSION;  Surgeon: MihaiSanda Klein  [facility];  Service: Cardiovascular;  Laterality: N/A;  . CARDIOVERSION N/A 08/02/2015   Procedure: CARDIOVERSION;  Surgeon: JamesThompson Grayer  [facility] [place];  Laterality: N/A;  . CHOLECYSTECTOMY    . COLONOSCOPY    .  CORONARY ANGIOPLASTY WITH STENT PLACEMENT  1996, 2009, August 2011   PTCA of L Cx 1996; BMS-RCA [facility]) 2009; Staged PCI RCA (70% ISR) --> 3.0 mm x 23 mm BMS, staged PCI Diag - 2.0 mm x 12 mm Mini Vision BMS (2.25 mm)  . cyst removed  in college  . NEUROPLASTY / TRANSPOSITION MEDIAN NERVE AT CARPAL TUNNEL BILATERAL    . NM MYOVIEW LTD  November 2013   EF 53%; fixed mid inferior-inferolateral defect, infarct with no ischemia.  . right knee arthrsoscopy  17yr ago  . right trigger finger    . seeds placed into prostate   2005  . TEE WITHOUT CARDIOVERSION N/A 06/12/2015   Procedure: TRANSESOPHAGEAL ECHOCARDIOGRAM (TEE) - WITH  CARDIOVERSION;  Surgeon: MSanda Klein MD;  [place]  Service: Cardiovascular: EF 55-60%.No LA or LAA thrombus. Normal Peal PAP ~33 mmHg.  .Marland KitchenTOTAL KNEE ARTHROPLASTY  05/09/2012   Procedure: TOTAL KNEE ARTHROPLASTY;  Surgeon: FKerin Salen MD;  [place] [place]  Service: Orthopedics;  Laterality: Right;  . TRANSTHORACIC ECHOCARDIOGRAM  06/2015   EF 60-65%. Mild LA dilation. Normal PA pressures: 32 mmHg. Aortic sclerosis but no stenosis.  .Marland Kitchenwisdom teeth extraccted      Allergies  Allergen Reactions  . Crab [Shellfish Allergy] Rash  . Hydrocodone Hives  . Adhesive [Tape] Rash  . Oxycodone Rash    Allergies as of 04/19/2019      Reactions   Crab [shellfish Allergy] Rash   Hydrocodone Hives   Adhesive [tape] Rash   Oxycodone Rash      Medication List       Accurate as of April 19, 2019  3:04 PM. If you have any questions, ask your nurse or doctor.        acetaminophen 500 MG tablet Commonly known as: TYLENOL Take 1,000 mg by mouth. Three times a day as needed   ALPRAZolam 0.25 MG tablet Commonly known as: XANAX Take 0.25 mg by mouth 3 (three) times daily as needed for anxiety.   atorvastatin 40 MG tablet Commonly known as: LIPITOR TAKE 1 TABLET DAILY AT BEDTIME   Biofreeze 4 % Gel Generic drug: Menthol (Topical Analgesic) Apply topically 4 (four) times daily.   diltiazem 120 MG tablet Commonly known as: CARDIZEM Take two tablets; oral Once A Day   Eliquis 5 MG Tabs tablet Generic drug: apixaban TAKE 1 TABLET TWICE A DAY (SCHEDULE FOLLOW UP WITH CARDIOLOGIST PRIOR TO NEXT REFILL REQUEST)   esomeprazole 40 MG capsule Commonly known as: NEXIUM TAKE 1 CAPSULE DAILY   ferrous sulfate 325 (65 FE) MG tablet Take 325 mg by mouth daily with breakfast.   Flutter Devi Use as directed.   furosemide 40 MG tablet Commonly known as: LASIX Take 40 mg by mouth daily. What changed: Another medication with the same name was removed. Continue taking this  medication, and follow the directions you see here. Changed by: Joycelynn Fritsche X Deaisha Welborn, NP   losartan 100 MG tablet Commonly known as: COZAAR Take 50 mg by mouth daily.   Magnesium 200 MG Tabs Take by mouth. 1 tablet once a day   MIRALAX PO Take 17 g by mouth daily as needed (Constipation).   Myrbetriq 25 MG Tb24 tablet Generic drug: mirabegron ER Take 25 mg by mouth daily.   Nitrostat 0.4 MG SL tablet Generic drug: nitroGLYCERIN USE AS NEEDED   tamsulosin 0.4 MG Caps capsule Commonly known as: FLOMAX Take 0.4 mg by mouth at bedtime.   Vitamin  D3 25 MCG (1000 UT) Caps Take 1 capsule by mouth daily.      ROS was provided with assistance of staff.  Review of Systems  Constitutional: Negative for activity change, appetite change, chills, diaphoresis, fatigue, fever and unexpected weight change.  HENT: Positive for hearing loss. Negative for congestion and voice change.   Eyes: Negative for visual disturbance.  Respiratory: Positive for shortness of breath. Negative for cough and wheezing.        DOE, O2 dependent.   Cardiovascular: Positive for leg swelling. Negative for chest pain and palpitations.  Gastrointestinal: Negative for abdominal distention, abdominal pain, constipation, diarrhea, nausea and vomiting.  Genitourinary: Negative for difficulty urinating, dysuria and testicular pain.  Musculoskeletal: Positive for arthralgias and gait problem.  Skin: Negative for color change and pallor.  Neurological: Negative for dizziness, speech difficulty, weakness and headaches.       Memory lapses.   Psychiatric/Behavioral: Positive for dysphoric mood. Negative for agitation, behavioral problems, confusion, hallucinations and sleep disturbance. The patient is nervous/anxious.     Immunization History  Administered Date(s) Administered  . H1N1 04/13/2010  . Influenza, High Dose Seasonal PF 02/12/2014, 03/29/2018  . Influenza-Unspecified 03/06/2013, 02/12/2014, 03/06/2017  .  Pneumococcal Conjugate-13 02/12/2014  . Pneumococcal Polysaccharide-23 02/04/2013, 02/12/2014  . Td 09/04/2005  . Tdap 12/11/2012  . Zoster 07/07/2006   Pertinent  Health Maintenance Due  Topic Date Due  . PNA vac Low Risk Adult (2 of 2 - PCV13) 02/13/2015  . INFLUENZA VACCINE  01/05/2019   No flowsheet data found. Functional Status Survey:    Vitals:   04/19/19 0931  BP: 136/76  Pulse: (!) 58  Resp: 20  Temp: (!) 97.3 F (36.3 C)  SpO2: 97%  Weight: 223 lb 9.6 oz (101.4 kg)  Height: _0  (1.778 m)   Body mass index is 32.08 kg/m. Physical Exam Vitals signs and nursing note reviewed.  Constitutional:      General: He is not in acute distress.    Appearance: Normal appearance. He is obese. He is not ill-appearing, toxic-appearing or diaphoretic.  HENT:     Head: Normocephalic and atraumatic.     Nose: Nose normal.     Mouth/Throat:     Mouth: Mucous membranes are moist.  Eyes:     Extraocular Movements: Extraocular movements intact.     Conjunctiva/sclera: Conjunctivae normal.  Neck:     Musculoskeletal: Normal range of motion and neck supple.  Cardiovascular:     Rate and Rhythm: Normal rate. Rhythm irregular.     Heart sounds: No murmur.  Pulmonary:     Breath sounds: No wheezing or rales.  Abdominal:     General: Bowel sounds are normal. There is no distension.     Palpations: Abdomen is soft.     Tenderness: There is no abdominal tenderness. There is no right CVA tenderness, left CVA tenderness, guarding or rebound.  Musculoskeletal:     Right lower leg: Edema present.     Left lower leg: Edema present.     Comments: Trace to 1+ edema BLE  Skin:    General: Skin is warm and dry.  Neurological:     General: No focal deficit present.     Mental Status: He is alert and oriented to person, place, and time. Mental status is at baseline.     Motor: No weakness.     Coordination: Coordination normal.     Gait: Gait abnormal.  Psychiatric:  Mood and  Affect: Mood normal.        Behavior: Behavior normal.        Thought Content: Thought content normal.        Judgment: Judgment normal.     Labs reviewed: Recent Labs    07/19/18 1505 01/30/19 1700  NA 139 138  K 4.3 3.8  CL 106 101  CO2 29 26  GLUCOSE 112* 143*  BUN 14 14  CREATININE 1.04 0.95  CALCIUM 8.7* 9.0  MG 1.9  --    Recent Labs    01/30/19 1700  AST 14*  ALT 9  ALKPHOS 77  BILITOT 0.4  PROT 6.9  ALBUMIN 3.2*   Recent Labs    01/30/19 1700  WBC 8.2  NEUTROABS 6.2  HGB 9.9*  HCT 33.6*  MCV 83.8  PLT 309   Lab Results  Component Value Date   TSH 2.00 02/24/2016   No results found for: HGBA1C Lab Results  Component Value Date   CHOL 155 07/29/2014   HDL 51 07/29/2014   LDLCALC 83 07/29/2014   TRIG 105 07/29/2014   CHOLHDL 2.9 02/12/2014    Significant Diagnostic Results in last 30 days:  Ct Chest W Contrast  Result Date: 03/27/2019 CLINICAL DATA:  Follow-up lung nodule. EXAM: CT CHEST WITH CONTRAST TECHNIQUE: Multidetector CT imaging of the chest was performed during intravenous contrast administration. CONTRAST:  69m OMNIPAQUE IOHEXOL 300 MG/ML  SOLN COMPARISON:  11/23/2018 FINDINGS: Cardiovascular: Normal heart size. No pericardial effusion. 4 cm right ventricular outflow track is identified. Findings may reflect PA hypertension. Aortic atherosclerosis. Lad, left circumflex and RCA coronary artery calcifications. Mediastinum/Nodes: Normal appearance of the thyroid gland. The trachea appears patent and is midline. Normal appearance of the esophagus. Prominent mediastinal lymph nodes are identified including 1.6 cm right paratracheal lymph node, image 52/2. Previously this measured the same. 1.5 cm subcarinal lymph node is identified, image 72/2. Previously 1.3 cm. Enlarged right hilar node measures 2 cm, image 64/2. This is difficult to compare with the previous exam as the prior study was performed without IV contrast material. Lungs/Pleura: No  pleural effusion identified. Diffuse bilateral interstitial reticulation is identified which has a lower lung zone predominance. Mild bronchiectasis. A mosaic attenuation pattern is noted bilaterally. Right middle lobe lung nodule is unchanged measuring 4 mm, image 93/3. right upper lobe lung nodule measuring 4 mm is stable from previous exam, image 46/3. Upper Abdomen: Unchanged 1.5 cm low-density left adrenal nodule. Previous cholecystectomy. Left upper pole kidney cysts noted. Musculoskeletal: Spondylosis within the thoracic spine. Unchanged lower thoracic spine compression fracture IMPRESSION: 1. Unchanged right upper lobe and right middle lobe lung nodules. No new or enlarging pulmonary nodules identified. No follow-up needed if patient is low-risk (and has no known or suspected primary neoplasm). Non-contrast chest CT can be considered at 12 months (from 11/23/2018) if patient is high-risk. This recommendation follows the consensus statement: Guidelines for Management of Incidental Pulmonary Nodules Detected on CT Images: From the Fleischner Society 2017; Radiology 2017; 284:228-243. 2. Chronic interstitial lung disease with lower lung zone predominance and mild bronchiectasis. As mentioned previously findings may reflect nonspecific interstitial pneumonitis or early usual interstitial pneumonitis. 3. Multi vessel coronary artery calcifications noted. 4. Stable enlarged mediastinal and right hilar lymph nodes. 5. Stable left adrenal nodule. 6. Stable lower thoracic spine compression fracture. Enlargement of the right ventricular outflow tract may reflect underlying PA hypertension. 7. Unchanged lower thoracic spine compression fracture. Aortic Atherosclerosis (ICD10-I70.0). Electronically Signed  By: Kerby Moors M.D.   On: 03/27/2019 15:45    Assessment/Plan Anxiety He has been adjusting to SNF [facility], seems less anxious than I first met with him, will continue prn Xanax since it was used 5x in the past  2 weeks, observe.   OSA (obstructive sleep apnea) Encourage CPAP use, but the patient is able to make decision independently. Observe.   MCI (mild cognitive impairment) with memory loss Mild, MMSE 24/30, passed clock drawing test, continue SNF [facility] for safety, care assistance, observe   Diastolic CHF (HCC) Chronic O2 dependent, edema BLE, continue Furosemide 27m qd.   Persistent atrial fibrillation ([place]:  CHA2DS2-VASc Score 4; On Eliquis Heart rate is in control, continue Diltiazem 121mqd, Eliquis 83m383mid.      Family/ staff Communication: plan of care reviewed with the patient and charge nurse.   Labs/tests ordered:  none  Time spend 25 minutes.

## 2019-04-19 NOTE — Assessment & Plan Note (Signed)
Heart rate is in control, continue Diltiazem 120mg  qd, Eliquis 5mg  bid.

## 2019-04-19 NOTE — Assessment & Plan Note (Signed)
Encourage CPAP use, but the patient is able to make decision independently. Observe.

## 2019-04-19 NOTE — Assessment & Plan Note (Addendum)
He has been adjusting to SNF [facility], seems less anxious than I first met with him, will continue prn Xanax since it was used 5x in the past 2 weeks, observe.

## 2019-04-19 NOTE — Assessment & Plan Note (Signed)
Chronic O2 dependent, edema BLE, continue Furosemide 40mg  qd.

## 2019-04-23 DIAGNOSIS — R2681 Unsteadiness on feet: Secondary | ICD-10-CM | POA: Diagnosis not present

## 2019-04-23 DIAGNOSIS — Z20828 Contact with and (suspected) exposure to other viral communicable diseases: Secondary | ICD-10-CM | POA: Diagnosis not present

## 2019-04-23 DIAGNOSIS — R29898 Other symptoms and signs involving the musculoskeletal system: Secondary | ICD-10-CM | POA: Diagnosis not present

## 2019-04-23 DIAGNOSIS — M6281 Muscle weakness (generalized): Secondary | ICD-10-CM | POA: Diagnosis not present

## 2019-04-23 DIAGNOSIS — M150 Primary generalized (osteo)arthritis: Secondary | ICD-10-CM | POA: Diagnosis not present

## 2019-04-23 DIAGNOSIS — M25562 Pain in left knee: Secondary | ICD-10-CM | POA: Diagnosis not present

## 2019-04-23 DIAGNOSIS — I4891 Unspecified atrial fibrillation: Secondary | ICD-10-CM | POA: Diagnosis not present

## 2019-04-24 DIAGNOSIS — M25562 Pain in left knee: Secondary | ICD-10-CM | POA: Diagnosis not present

## 2019-04-24 DIAGNOSIS — M6281 Muscle weakness (generalized): Secondary | ICD-10-CM | POA: Diagnosis not present

## 2019-04-24 DIAGNOSIS — R29898 Other symptoms and signs involving the musculoskeletal system: Secondary | ICD-10-CM | POA: Diagnosis not present

## 2019-04-24 DIAGNOSIS — M150 Primary generalized (osteo)arthritis: Secondary | ICD-10-CM | POA: Diagnosis not present

## 2019-04-24 DIAGNOSIS — I4891 Unspecified atrial fibrillation: Secondary | ICD-10-CM | POA: Diagnosis not present

## 2019-04-24 DIAGNOSIS — R2681 Unsteadiness on feet: Secondary | ICD-10-CM | POA: Diagnosis not present

## 2019-04-25 DIAGNOSIS — M6281 Muscle weakness (generalized): Secondary | ICD-10-CM | POA: Diagnosis not present

## 2019-04-25 DIAGNOSIS — M150 Primary generalized (osteo)arthritis: Secondary | ICD-10-CM | POA: Diagnosis not present

## 2019-04-25 DIAGNOSIS — M25562 Pain in left knee: Secondary | ICD-10-CM | POA: Diagnosis not present

## 2019-04-25 DIAGNOSIS — I4891 Unspecified atrial fibrillation: Secondary | ICD-10-CM | POA: Diagnosis not present

## 2019-04-25 DIAGNOSIS — R29898 Other symptoms and signs involving the musculoskeletal system: Secondary | ICD-10-CM | POA: Diagnosis not present

## 2019-04-25 DIAGNOSIS — R2681 Unsteadiness on feet: Secondary | ICD-10-CM | POA: Diagnosis not present

## 2019-04-26 DIAGNOSIS — R29898 Other symptoms and signs involving the musculoskeletal system: Secondary | ICD-10-CM | POA: Diagnosis not present

## 2019-04-26 DIAGNOSIS — I4891 Unspecified atrial fibrillation: Secondary | ICD-10-CM | POA: Diagnosis not present

## 2019-04-26 DIAGNOSIS — M25562 Pain in left knee: Secondary | ICD-10-CM | POA: Diagnosis not present

## 2019-04-26 DIAGNOSIS — R2681 Unsteadiness on feet: Secondary | ICD-10-CM | POA: Diagnosis not present

## 2019-04-26 DIAGNOSIS — M150 Primary generalized (osteo)arthritis: Secondary | ICD-10-CM | POA: Diagnosis not present

## 2019-04-26 DIAGNOSIS — M6281 Muscle weakness (generalized): Secondary | ICD-10-CM | POA: Diagnosis not present

## 2019-04-29 DIAGNOSIS — I4891 Unspecified atrial fibrillation: Secondary | ICD-10-CM | POA: Diagnosis not present

## 2019-04-29 DIAGNOSIS — R29898 Other symptoms and signs involving the musculoskeletal system: Secondary | ICD-10-CM | POA: Diagnosis not present

## 2019-04-29 DIAGNOSIS — R2681 Unsteadiness on feet: Secondary | ICD-10-CM | POA: Diagnosis not present

## 2019-04-29 DIAGNOSIS — M6281 Muscle weakness (generalized): Secondary | ICD-10-CM | POA: Diagnosis not present

## 2019-04-29 DIAGNOSIS — M25562 Pain in left knee: Secondary | ICD-10-CM | POA: Diagnosis not present

## 2019-04-29 DIAGNOSIS — M150 Primary generalized (osteo)arthritis: Secondary | ICD-10-CM | POA: Diagnosis not present

## 2019-04-30 ENCOUNTER — Encounter: Payer: Self-pay | Admitting: Nurse Practitioner

## 2019-04-30 ENCOUNTER — Non-Acute Institutional Stay (SKILLED_NURSING_FACILITY): Payer: Medicare Other | Admitting: Nurse Practitioner

## 2019-04-30 DIAGNOSIS — R32 Unspecified urinary incontinence: Secondary | ICD-10-CM | POA: Insufficient documentation

## 2019-04-30 DIAGNOSIS — D508 Other iron deficiency anemias: Secondary | ICD-10-CM | POA: Diagnosis not present

## 2019-04-30 DIAGNOSIS — M25562 Pain in left knee: Secondary | ICD-10-CM | POA: Diagnosis not present

## 2019-04-30 DIAGNOSIS — M1711 Unilateral primary osteoarthritis, right knee: Secondary | ICD-10-CM

## 2019-04-30 DIAGNOSIS — I5030 Unspecified diastolic (congestive) heart failure: Secondary | ICD-10-CM

## 2019-04-30 DIAGNOSIS — I10 Essential (primary) hypertension: Secondary | ICD-10-CM

## 2019-04-30 DIAGNOSIS — K219 Gastro-esophageal reflux disease without esophagitis: Secondary | ICD-10-CM

## 2019-04-30 DIAGNOSIS — R350 Frequency of micturition: Secondary | ICD-10-CM | POA: Diagnosis not present

## 2019-04-30 DIAGNOSIS — F419 Anxiety disorder, unspecified: Secondary | ICD-10-CM

## 2019-04-30 DIAGNOSIS — R29898 Other symptoms and signs involving the musculoskeletal system: Secondary | ICD-10-CM | POA: Diagnosis not present

## 2019-04-30 DIAGNOSIS — R918 Other nonspecific abnormal finding of lung field: Secondary | ICD-10-CM | POA: Diagnosis not present

## 2019-04-30 DIAGNOSIS — R2681 Unsteadiness on feet: Secondary | ICD-10-CM | POA: Diagnosis not present

## 2019-04-30 DIAGNOSIS — I4819 Other persistent atrial fibrillation: Secondary | ICD-10-CM

## 2019-04-30 DIAGNOSIS — I4891 Unspecified atrial fibrillation: Secondary | ICD-10-CM | POA: Diagnosis not present

## 2019-04-30 DIAGNOSIS — M6281 Muscle weakness (generalized): Secondary | ICD-10-CM | POA: Diagnosis not present

## 2019-04-30 DIAGNOSIS — M150 Primary generalized (osteo)arthritis: Secondary | ICD-10-CM | POA: Diagnosis not present

## 2019-04-30 LAB — BASIC METABOLIC PANEL
BUN: 20 (ref 4–21)
CO2: 29 — AB (ref 13–22)
Chloride: 99 (ref 99–108)
Creatinine: 0.9 (ref 0.6–1.3)
Glucose: 96
Potassium: 4.2 (ref 3.4–5.3)
Sodium: 137 (ref 137–147)

## 2019-04-30 LAB — CBC AND DIFFERENTIAL
HCT: 34 — AB (ref 41–53)
Hemoglobin: 10.7 — AB (ref 13.5–17.5)
Neutrophils Absolute: 6224
Platelets: 285 (ref 150–399)
WBC: 9.1

## 2019-04-30 LAB — LIPID PANEL
Cholesterol: 108 (ref 0–200)
HDL: 41 (ref 35–70)
LDL Cholesterol: 50
LDl/HDL Ratio: 2.6
Triglycerides: 88 (ref 40–160)

## 2019-04-30 LAB — HEPATIC FUNCTION PANEL
ALT: 4 — AB (ref 10–40)
AST: 9 — AB (ref 14–40)
Alkaline Phosphatase: 77 (ref 25–125)
Bilirubin, Total: 0.4

## 2019-04-30 LAB — CBC: RBC: 4.12 (ref 3.87–5.11)

## 2019-04-30 LAB — COMPREHENSIVE METABOLIC PANEL: Calcium: 8.5 — AB (ref 8.7–10.7)

## 2019-04-30 LAB — FOLATE: Folate: 13.7

## 2019-04-30 NOTE — Assessment & Plan Note (Signed)
Continue prn Tylenol.

## 2019-04-30 NOTE — Progress Notes (Signed)
Location:   [facility] Room Number: 5 Place of Service:  SNF (31) Provider:  Preet Perrier NP  Alroy Dust, L.Marlou Sa, MD  Patient Care Team: Alroy Dust, Carlean Jews.Marlou Sa, MD as PCP - General (Family Medicine) Leonie Dela Sweeny, MD as PCP - Cardiology (Cardiology)  Extended Emergency Contact Information Primary Emergency Contact: [facility] Address: [address]          [place], [place] [address] [name] of [place] Phone: [phone] Mobile Phone: [phone] Relation: Spouse Secondary Emergency Contact: Janey Genta Address: [address]          [place], [place] Phone: [phone] Mobile Phone: [phone] Relation: Daughter  Code Status:  Full Code Goals of care: Advanced Directive information Advanced Directives 10/19/2017  Does Patient Have a Medical Advance Directive? No  Type of Advance Directive -  Copy of [place] in Chart? -  Would patient like information on creating a medical advance directive? No - Patient declined  Pre-existing out of facility DNR order (yellow form or pink MOST form) -     Chief Complaint  Patient presents with  . Medical Management of Chronic Issues  . Health Maintenance    PCV13, influenza vaccine    HPI:  Pt is a 85 y.o. [name] seen today for medical management of chronic diseases.    The patient has Hx of IDA, last Hgb 9.6 04/03/19, Iron 30, on Fe qd. Urinary frequency/incontinence, no urinary retention, on Tamsulosin 0.4mg  qd. HTN, blood pressure is controlled, on Losartan 50mg  qd. CHF/edema BLE, on Furosemide 40mg  qd. Afib, heart rate is in control, on Diltiazem 120mg  qd, Eliquis 5mg  bid. GERD, stable, on Esomeprazole 40mg  qd. OA pain, controlled, on Tylenol 1000mg  tid prn. Anxiety, manageable, continue prn Alprazolam 0.25mg  tid.    Past Medical History:  Diagnosis Date  . Anxiety   . CAD S/P percutaneous coronary angioplasty 1996, 2009, 01/2010   PTCA of L Cx 1996;  BMS-RCA [facility]) 2009; Staged PCI RCA (70% ISR) --> 3.0 mm x 23 mm BMS, staged PCI Diag - 2.0 mm x 12 mm Mini Vision BMS (2.25 mm)  . Cataracts, bilateral    immature  . Chronic back pain    scoliosis--pt states unable to have surgery bc of age  . GERD (gastroesophageal reflux disease)    takes Nexium daily  . History of colon polyps   . Hyperlipidemia   . Hypertension   . Myocardial infarction [facility]) 1996, 2009   x 2;last time was about 8-60yrs ago   . Obesity (BMI 30.0-34.9)   . OSA (obstructive sleep apnea)   . Osteoarthritis of both knees   . Peripheral edema    Venous Stasis  . Persistent atrial fibrillation (HCC)    Unable to maintaine NSR after DCCV with Tikosyn.  Plan = rate control w/diltiazem (beta blocker off b/c fatigue). CHA2DS2Vasc 4. DOAC - Eliquis.   . Prostate cancer ([place]) 2005   seed implant   Past Surgical History:  Procedure Laterality Date  . CARDIOVERSION N/A 06/12/2015   Procedure: CARDIOVERSION;  Surgeon: Sanda Klein, MD;  Location: [facility];  Service: Cardiovascular;  Laterality: N/A;  . CARDIOVERSION N/A 08/02/2015   Procedure: CARDIOVERSION;  Surgeon: [name], MD;  Location: [facility];  Service: Cardiovascular;  Laterality: N/A;  . CHOLECYSTECTOMY    . COLONOSCOPY    . CORONARY ANGIOPLASTY WITH STENT PLACEMENT  1996, 2009, August 2011   PTCA of L Cx 1996; BMS-RCA [facility]) 2009; Staged PCI  RCA (70% ISR) --> 3.0 mm x 23 mm BMS, staged PCI Diag - 2.0 mm x 12 mm Mini Vision BMS (2.25 mm)  . cyst removed  in college  . NEUROPLASTY / TRANSPOSITION MEDIAN NERVE AT CARPAL TUNNEL BILATERAL    . NM MYOVIEW LTD  November 2013   EF 53%; fixed mid inferior-inferolateral defect, infarct with no ischemia.  . right knee arthrsoscopy  50yrs ago  . right trigger finger    . seeds placed into prostate   2005  . TEE WITHOUT CARDIOVERSION N/A 06/12/2015   Procedure: TRANSESOPHAGEAL ECHOCARDIOGRAM (TEE) - WITH CARDIOVERSION;  Surgeon: Sanda Klein, MD;  Location: [facility] [place];  Service: Cardiovascular: EF 55-60%.No LA or LAA thrombus. Normal Peal PAP ~33 mmHg.  [name] TOTAL KNEE ARTHROPLASTY  05/09/2012   Procedure: TOTAL KNEE ARTHROPLASTY;  Surgeon: Kerin [place]en, MD;  Location: [facility];  Service: Orthopedics;  Laterality: Right;  . TRANSTHORACIC ECHOCARDIOGRAM  06/2015   EF 60-65%. Mild LA dilation. Normal PA pressures: 32 mmHg. Aortic sclerosis but no stenosis.  [name] wisdom teeth extraccted      Allergies  Allergen Reactions  . Crab [Shellfish Allergy] Rash  . Hydrocodone Hives  . Adhesive [Tape] Rash  . Oxycodone Rash    Allergies as of 04/30/2019      Reactions   Crab [shellfish Allergy] Rash   Hydrocodone Hives   Adhesive [tape] Rash   Oxycodone Rash      Medication List       Accurate as of April 30, 2019  1:30 PM. If you have any questions, ask your nurse or doctor.        acetaminophen 500 MG tablet Commonly known as: TYLENOL Take 1,000 mg by mouth. Three times a day as needed   ALPRAZolam 0.25 MG tablet Commonly known as: XANAX Take 0.25 mg by mouth 3 (three) times daily as needed for anxiety.   atorvastatin 40 MG tablet Commonly known as: LIPITOR TAKE 1 TABLET DAILY AT BEDTIME   Biofreeze 4 % Gel Generic drug: Menthol (Topical Analgesic) Apply topically 4 (four) times daily.   diltiazem 120 MG tablet Commonly known as: CARDIZEM Take two tablets; oral Once A Day   Eliquis 5 MG Tabs tablet Generic drug: apixaban TAKE 1 TABLET TWICE A DAY (SCHEDULE FOLLOW UP WITH CARDIOLOGIST PRIOR TO NEXT REFILL REQUEST)   esomeprazole 40 MG capsule Commonly known as: NEXIUM TAKE 1 CAPSULE DAILY   ferrous sulfate 325 (65 FE) MG tablet Take 325 mg by mouth daily with breakfast.   Flutter Devi Use as directed.   furosemide 40 MG tablet Commonly known as: LASIX Take 40 mg by mouth daily.   losartan 100 MG tablet Commonly known as: COZAAR Take 50 mg by mouth daily.   Magnesium 200 MG Tabs Take by mouth. 1 tablet once a day    MIRALAX PO Take 17 g by mouth daily as needed (Constipation).   Myrbetriq 25 MG Tb24 tablet Generic drug: mirabegron ER Take 25 mg by mouth daily.   Nitrostat 0.4 MG SL tablet Generic drug: nitroGLYCERIN USE AS NEEDED   tamsulosin 0.4 MG Caps capsule Commonly known as: FLOMAX Take 0.4 mg by mouth at bedtime.   Vitamin D3 25 MCG (1000 UT) Caps Take 1 capsule by mouth daily.       Review of Systems  Constitutional: Negative for activity change, appetite change, chills, diaphoresis, fatigue, fever and unexpected weight change.  HENT: Positive for hearing loss. Negative for congestion and voice change.  Respiratory: Positive for shortness of breath. Negative for cough and wheezing.        DOE, O2 dependent  Cardiovascular: Positive for leg swelling. Negative for chest pain and palpitations.  Gastrointestinal: Negative for abdominal distention, constipation, diarrhea, nausea and vomiting.  Genitourinary: Negative for difficulty urinating, dysuria and urgency.  Musculoskeletal: Positive for arthralgias and gait problem.       Chronic left knee pain  Skin: Negative for color change and pallor.  Neurological: Negative for dizziness, speech difficulty, weakness and headaches.       Memory lapses.   Psychiatric/Behavioral: Positive for dysphoric mood. Negative for agitation, behavioral problems, hallucinations and sleep disturbance. The patient is nervous/anxious.     Immunization History  Administered Date(s) Administered  . H1N1 04/13/2010  . Influenza, High Dose Seasonal PF 02/12/2014, 03/29/2018  . Influenza-Unspecified 03/06/2013, 02/12/2014, 03/06/2017  . Pneumococcal Conjugate-13 02/12/2014  . Pneumococcal Polysaccharide-23 02/04/2013, 02/12/2014  . Td 09/04/2005  . Tdap 12/11/2012  . Zoster 07/07/2006   Pertinent  Health Maintenance Due  Topic Date Due  . PNA vac Low Risk Adult (2 of 2 - PCV13) 02/13/2015  . INFLUENZA VACCINE  01/05/2019   No flowsheet data  found. Functional Status Survey:    Vitals:   04/30/19 0953  BP: 118/70  Pulse: 62  Resp: 20  Temp: (!) 96.8 F (36 C)  SpO2: 90%  Weight: 223 lb 9.6 oz (101.4 kg)  Height: 5\' 10"  (1.778 m)   Body mass index is 32.08 kg/m. Physical Exam Vitals signs and nursing note reviewed.  Constitutional:      General: He is not in acute distress.    Appearance: Normal appearance. He is obese. He is not ill-appearing, toxic-appearing or diaphoretic.  HENT:     Head: Normocephalic and atraumatic.     Nose: Nose normal.     Mouth/Throat:     Mouth: Mucous membranes are moist.  Eyes:     Extraocular Movements: Extraocular movements intact.     Conjunctiva/sclera: Conjunctivae normal.     Pupils: Pupils are equal, round, and reactive to light.  Neck:     Musculoskeletal: Normal range of motion and neck supple.  Cardiovascular:     Rate and Rhythm: Normal rate. Rhythm irregular.     Heart sounds: No murmur.  Pulmonary:     Breath sounds: No wheezing, rhonchi or rales.  Abdominal:     General: Bowel sounds are normal. There is no distension.     Palpations: Abdomen is soft.     Tenderness: There is no abdominal tenderness. There is no right CVA tenderness, guarding or rebound.  Musculoskeletal:     Right lower leg: Edema present.     Left lower leg: Edema present.     Comments: Trace edema BLE  Skin:    General: Skin is warm and dry.  Neurological:     General: No focal deficit present.     Mental Status: He is alert and oriented to person, place, and time. Mental status is at baseline.     Motor: No weakness.     Gait: Gait abnormal.  Psychiatric:        Mood and Affect: Mood normal.        Behavior: Behavior normal.     Labs reviewed: Recent Labs    07/19/18 1505 01/30/19 1700 03/25/19 03/26/19  NA 139 138 141 142  K 4.3 3.8 4.1 4.0  CL 106 101 104 103  CO2 29 26 28* 28*  GLUCOSE  112* 143*  --   --   BUN 14 14 16 17   CREATININE 1.04 0.95 0.8 0.8  CALCIUM 8.7* 9.0  8.5* 8.4*  MG 1.9  --   --   --    Recent Labs    01/30/19 1700 03/26/19  AST 14* 8*  ALT 9 4*  ALKPHOS 77 69  BILITOT 0.4  --   PROT 6.9  --   ALBUMIN 3.2* 3.5   Recent Labs    01/30/19 1700 03/26/19 04/03/19  WBC 8.2 8.4 9.4  NEUTROABS 6.2  --  7,417  HGB 9.9* 9.4* 9.6*  HCT 33.6* 30* 31*  MCV 83.8  --   --   PLT 309 357 327   Lab Results  Component Value Date   TSH 2.00 02/24/2016   No results found for: HGBA1C Lab Results  Component Value Date   CHOL 155 07/29/2014   HDL 51 07/29/2014   LDLCALC 83 07/29/2014   TRIG 105 07/29/2014   CHOLHDL 2.9 02/12/2014    Significant Diagnostic Results in last 30 days:  No results found.  Assessment/Plan Persistent atrial fibrillation ([place]):  CHA2DS2-VASc Score 4; On Eliquis Heart rate is in control, continue Diltiazem, Eliquis.   Essential hypertension Blood pressure is controlled, continue Losartan 50mg  qd.  Diastolic CHF (HCC) Chronic edema BLE, DOE, continue Furosemide 40mg  qd.   Anxiety Stable, adjusting to SNF [facility] well, continue prn Alprazolam.   Iron deficiency anemia Pending CBC, started Fe daily since 04/03/19, last Hgb 9.6, iron 30.   Osteoarthritis of right knee Continue prn Tylenol.   GERD (gastroesophageal reflux disease) Stable, continue Esomeprazole 40mg  qd.   Urinary frequency No urinary retention, continue Tamsulosin 0.4mg  qd.   Multiple lung nodules on CT 03/2019 CT chest: 1. Unchanged right upper lobe and right middle lobe lung nodules. No new or enlarging pulmonary nodules identified. No follow-up needed if patient is low-risk (and has no known or suspected primary neoplasm). Non-contrast chest CT can be considered at 12 months (from 11/23/2018) if patient is high-risk. This recommendation follows the consensus statement: Guidelines for Management of Incidental Pulmonary Nodules Detected on CT Images: From the Fleischner Society 2017; Radiology 2017; 284:228-243. 2. Chronic  interstitial lung disease with lower lung zone predominance and mild bronchiectasis. As mentioned previously findings may reflect nonspecific interstitial pneumonitis or early usual interstitial pneumonitis. 3. Multi vessel coronary artery calcifications noted. 4. Stable enlarged mediastinal and right hilar lymph nodes. 5. Stable left adrenal nodule. 6. Stable lower thoracic spine compression fracture. Enlargement of the right ventricular outflow tract may reflect underlying PA hypertension. 7. Unchanged lower thoracic spine compression fracture.     Family/ staff Communication: plan of care reviewed with the patient and charge nurse.   Labs/tests ordered:  none  Time spend 25 minutes.

## 2019-04-30 NOTE — Assessment & Plan Note (Signed)
Blood pressure is controlled, continue Losartan 50mg  qd.

## 2019-04-30 NOTE — Assessment & Plan Note (Signed)
Pending CBC, started Fe daily since 04/03/19, last Hgb 9.6, iron 30.

## 2019-04-30 NOTE — Assessment & Plan Note (Signed)
Stable, continue Esomeprazole 40mg  qd.

## 2019-04-30 NOTE — Assessment & Plan Note (Signed)
Heart rate is in control, continue Diltiazem, Eliquis.  °

## 2019-04-30 NOTE — Assessment & Plan Note (Signed)
Stable, adjusting to SNF [facility] well, continue prn Alprazolam.

## 2019-04-30 NOTE — Assessment & Plan Note (Signed)
Chronic edema BLE, DOE, continue Furosemide 40mg  qd.

## 2019-04-30 NOTE — Assessment & Plan Note (Signed)
No urinary retention, continue Tamsulosin 0.4mg qd.  

## 2019-04-30 NOTE — Assessment & Plan Note (Signed)
03/2019 CT chest: 1. Unchanged right upper lobe and right middle lobe lung nodules. No new or enlarging pulmonary nodules identified. No follow-up needed if patient is low-risk (and has no known or suspected primary neoplasm). Non-contrast chest CT can be considered at 12 months (from 11/23/2018) if patient is high-risk. This recommendation follows the consensus statement: Guidelines for Management of Incidental Pulmonary Nodules Detected on CT Images: From the Fleischner Society 2017; Radiology 2017; 284:228-243. 2. Chronic interstitial lung disease with lower lung zone predominance and mild bronchiectasis. As mentioned previously findings may reflect nonspecific interstitial pneumonitis or early usual interstitial pneumonitis. 3. Multi vessel coronary artery calcifications noted. 4. Stable enlarged mediastinal and right hilar lymph nodes. 5. Stable left adrenal nodule. 6. Stable lower thoracic spine compression fracture. Enlargement of the right ventricular outflow tract may reflect underlying PA hypertension. 7. Unchanged lower thoracic spine compression fracture.

## 2019-05-01 DIAGNOSIS — M150 Primary generalized (osteo)arthritis: Secondary | ICD-10-CM | POA: Diagnosis not present

## 2019-05-01 DIAGNOSIS — R2681 Unsteadiness on feet: Secondary | ICD-10-CM | POA: Diagnosis not present

## 2019-05-01 DIAGNOSIS — M6281 Muscle weakness (generalized): Secondary | ICD-10-CM | POA: Diagnosis not present

## 2019-05-01 DIAGNOSIS — M25562 Pain in left knee: Secondary | ICD-10-CM | POA: Diagnosis not present

## 2019-05-01 DIAGNOSIS — R29898 Other symptoms and signs involving the musculoskeletal system: Secondary | ICD-10-CM | POA: Diagnosis not present

## 2019-05-01 DIAGNOSIS — I4891 Unspecified atrial fibrillation: Secondary | ICD-10-CM | POA: Diagnosis not present

## 2019-05-03 DIAGNOSIS — M150 Primary generalized (osteo)arthritis: Secondary | ICD-10-CM | POA: Diagnosis not present

## 2019-05-03 DIAGNOSIS — R29898 Other symptoms and signs involving the musculoskeletal system: Secondary | ICD-10-CM | POA: Diagnosis not present

## 2019-05-03 DIAGNOSIS — R2681 Unsteadiness on feet: Secondary | ICD-10-CM | POA: Diagnosis not present

## 2019-05-03 DIAGNOSIS — M6281 Muscle weakness (generalized): Secondary | ICD-10-CM | POA: Diagnosis not present

## 2019-05-03 DIAGNOSIS — I4891 Unspecified atrial fibrillation: Secondary | ICD-10-CM | POA: Diagnosis not present

## 2019-05-03 DIAGNOSIS — M25562 Pain in left knee: Secondary | ICD-10-CM | POA: Diagnosis not present

## 2019-05-06 DIAGNOSIS — R2681 Unsteadiness on feet: Secondary | ICD-10-CM | POA: Diagnosis not present

## 2019-05-06 DIAGNOSIS — M25562 Pain in left knee: Secondary | ICD-10-CM | POA: Diagnosis not present

## 2019-05-06 DIAGNOSIS — M150 Primary generalized (osteo)arthritis: Secondary | ICD-10-CM | POA: Diagnosis not present

## 2019-05-06 DIAGNOSIS — M6281 Muscle weakness (generalized): Secondary | ICD-10-CM | POA: Diagnosis not present

## 2019-05-06 DIAGNOSIS — I4891 Unspecified atrial fibrillation: Secondary | ICD-10-CM | POA: Diagnosis not present

## 2019-05-06 DIAGNOSIS — R29898 Other symptoms and signs involving the musculoskeletal system: Secondary | ICD-10-CM | POA: Diagnosis not present

## 2019-05-07 DIAGNOSIS — M6281 Muscle weakness (generalized): Secondary | ICD-10-CM | POA: Diagnosis not present

## 2019-05-07 DIAGNOSIS — Z7389 Other problems related to life management difficulty: Secondary | ICD-10-CM | POA: Diagnosis not present

## 2019-05-07 DIAGNOSIS — R2681 Unsteadiness on feet: Secondary | ICD-10-CM | POA: Diagnosis not present

## 2019-05-07 DIAGNOSIS — F0391 Unspecified dementia with behavioral disturbance: Secondary | ICD-10-CM | POA: Diagnosis not present

## 2019-05-07 DIAGNOSIS — M25531 Pain in right wrist: Secondary | ICD-10-CM | POA: Diagnosis not present

## 2019-05-07 DIAGNOSIS — M25562 Pain in left knee: Secondary | ICD-10-CM | POA: Diagnosis not present

## 2019-05-10 ENCOUNTER — Other Ambulatory Visit: Payer: Self-pay | Admitting: *Deleted

## 2019-05-10 MED ORDER — ALPRAZOLAM 0.25 MG PO TABS: 0.2500 mg | ORAL_TABLET | Freq: Three times a day (TID) | ORAL | 0 refills | Status: DC | PRN

## 2019-05-10 NOTE — Telephone Encounter (Signed)
Received fax refill request from [facility] °Pended Rx and sent to [employer] for approval.  °

## 2019-05-21 DIAGNOSIS — Z20828 Contact with and (suspected) exposure to other viral communicable diseases: Secondary | ICD-10-CM | POA: Diagnosis not present

## 2019-05-22 ENCOUNTER — Non-Acute Institutional Stay (SKILLED_NURSING_FACILITY): Payer: Medicare Other | Admitting: Nurse Practitioner

## 2019-05-22 ENCOUNTER — Encounter: Payer: Self-pay | Admitting: Nurse Practitioner

## 2019-05-22 DIAGNOSIS — F419 Anxiety disorder, unspecified: Secondary | ICD-10-CM | POA: Diagnosis not present

## 2019-05-22 DIAGNOSIS — J9611 Chronic respiratory failure with hypoxia: Secondary | ICD-10-CM

## 2019-05-22 DIAGNOSIS — I10 Essential (primary) hypertension: Secondary | ICD-10-CM | POA: Diagnosis not present

## 2019-05-22 DIAGNOSIS — D508 Other iron deficiency anemias: Secondary | ICD-10-CM

## 2019-05-22 DIAGNOSIS — R350 Frequency of micturition: Secondary | ICD-10-CM

## 2019-05-22 DIAGNOSIS — I5030 Unspecified diastolic (congestive) heart failure: Secondary | ICD-10-CM

## 2019-05-22 DIAGNOSIS — I4819 Other persistent atrial fibrillation: Secondary | ICD-10-CM

## 2019-05-22 DIAGNOSIS — K219 Gastro-esophageal reflux disease without esophagitis: Secondary | ICD-10-CM

## 2019-05-22 NOTE — Assessment & Plan Note (Signed)
Stable, continue Myrbetriq, Tamsulosin.

## 2019-05-22 NOTE — Assessment & Plan Note (Signed)
Stable, continue Esomeprazole.  

## 2019-05-22 NOTE — Assessment & Plan Note (Signed)
Chronic edema BLE, O2 dependent, continue Furosemide.

## 2019-05-22 NOTE — Progress Notes (Signed)
Location:   [place] Room Number: 5 Place of Service:  SNF (31) Provider:  Donye Campanelli NP  Alroy Dust, L.Marlou Sa, MD  Patient Care Team: Alroy Dust, Carlean Jews.Marlou Sa, MD as PCP - General (Family Medicine) Leonie Lakhia Gengler, MD as PCP - Cardiology (Cardiology)  Extended Emergency Contact Information Primary Emergency Contact: [facility] Address: [address]          [place],  [address] [name] of [place] Phone: [phone] Mobile Phone: [phone] Relation: Spouse Secondary Emergency Contact: Janey Genta Address: [address]          [place], [place] Phone: [phone] Mobile Phone: [phone] Relation: Daughter  Code Status:  Full Code Goals of care: Advanced Directive information Advanced Directives 10/19/2017  Does Patient Have a Medical Advance Directive? No  Type of Advance Directive -  Copy of [place] in Chart? -  Would patient like information on creating a medical advance directive? No - Patient declined  Pre-existing out of facility DNR order (yellow form or pink MOST form) -     Chief Complaint  Patient presents with  . Medical Management of Chronic Issues  . Health Maintenance    Influenza vaccine    HPI:  Pt is a 85 y.o. male seen today for medical management of chronic diseases.    The patient resides in SNF [facility] for safety, care assistance, adjusting well, sleeps/eats well, denied s/s of depression, anxiety, prn Lorazepam 0.25mg  tid available to him, only used 2x/2weeks. IDA, improved, on Fe qd. Urinary frequency, stable, on Tamsulosin 0.4mg  qd, Myrbetriq 25mg  qd. HTN, blood pressure is controlled on Losartan 50mg  qd. CHF/chronic trace edema BLE, on Furosemide 40mg  qd. GERD, stable, on Esomeprazole 40mg  qd. AFib heart rate is in control, on Diltiazem 240mg  qd, Eliquis 5mg  bid. ILD/Hypoxia, O2 dependent.    Past Medical History:  Diagnosis Date  . Anxiety   . CAD  S/P percutaneous coronary angioplasty 1996, 2009, 01/2010   PTCA of L Cx 1996; BMS-RCA [facility]) 2009; Staged PCI RCA (70% ISR) --> 3.0 mm x 23 mm BMS, staged PCI Diag - 2.0 mm x 12 mm Mini Vision BMS (2.25 mm)  . Cataracts, bilateral    immature  . Chronic back pain    scoliosis--pt states unable to have surgery bc of age  . GERD (gastroesophageal reflux disease)    takes Nexium daily  . History of colon polyps   . Hyperlipidemia   . Hypertension   . Myocardial infarction [facility]) 1996, 2009   x 2;last time was about 8-5yrs ago   . Obesity (BMI 30.0-34.9)   . OSA (obstructive sleep apnea)   . Osteoarthritis of both knees   . Peripheral edema    Venous Stasis  . Persistent atrial fibrillation (HCC)    Unable to maintaine NSR after DCCV with Tikosyn.  Plan = rate control w/diltiazem (beta blocker off b/c fatigue). CHA2DS2Vasc 4. DOAC - Eliquis.   . Prostate cancer ([place]) 2005   seed implant   Past Surgical History:  Procedure Laterality Date  . CARDIOVERSION N/A 06/12/2015   Procedure: CARDIOVERSION;  Surgeon: Sanda Klein, MD;  Location: [place] ENDOSCOPY;  Service: Cardiovascular;  Laterality: N/A;  . CARDIOVERSION N/A 08/02/2015   Procedure: CARDIOVERSION;  Surgeon: [name], MD;  Location: [place];  Service: Cardiovascular;  Laterality: N/A;  . CHOLECYSTECTOMY    . COLONOSCOPY    . [place], 2009, August 2011  PTCA of L Cx 1996; BMS-RCA [facility]) 2009; Staged PCI RCA (70% ISR) --> 3.0 mm x 23 mm BMS, staged PCI Diag - 2.0 mm x 12 mm Mini Vision BMS (2.25 mm)  . cyst removed  in college  . NEUROPLASTY / TRANSPOSITION MEDIAN NERVE AT CARPAL TUNNEL BILATERAL    . NM MYOVIEW LTD  November 2013   EF 53%; fixed mid inferior-inferolateral defect, infarct with no ischemia.  . right knee arthrsoscopy  22yrs ago  . right trigger finger    . seeds placed into prostate   2005  . TEE WITHOUT CARDIOVERSION N/A 06/12/2015   Procedure: TRANSESOPHAGEAL  ECHOCARDIOGRAM (TEE) - WITH CARDIOVERSION;  Surgeon: Sanda Klein, MD;  Location: [place];  Service: Cardiovascular: EF 55-60%.No LA or LAA thrombus. Normal Peal PAP ~33 mmHg.  [name] TOTAL KNEE ARTHROPLASTY  05/09/2012   Procedure: TOTAL KNEE ARTHROPLASTY;  Surgeon: Kerin Salen, MD;  Location: [place];  Service: Orthopedics;  Laterality: Right;  . TRANSTHORACIC ECHOCARDIOGRAM  06/2015   EF 60-65%. Mild LA dilation. Normal PA pressures: 32 mmHg. Aortic sclerosis but no stenosis.  [name] wisdom teeth extraccted      Allergies  Allergen Reactions  . Crab [Shellfish Allergy] Rash  . Hydrocodone Hives  . Adhesive [Tape] Rash  . Oxycodone Rash    Allergies as of 05/22/2019      Reactions   Crab [shellfish Allergy] Rash   Hydrocodone Hives   Adhesive [tape] Rash   Oxycodone Rash      Medication List       Accurate as of May 22, 2019  2:25 PM. If you have any questions, ask your nurse or doctor.        acetaminophen 500 MG tablet Commonly known as: TYLENOL Take 1,000 mg by mouth. Three times a day as needed   ALPRAZolam 0.25 MG tablet Commonly known as: XANAX Take 1 tablet (0.25 mg total) by mouth 3 (three) times daily as needed for anxiety.   atorvastatin 40 MG tablet Commonly known as: LIPITOR TAKE 1 TABLET DAILY AT BEDTIME   Biofreeze 4 % Gel Generic drug: Menthol (Topical Analgesic) Apply topically 4 (four) times daily.   diltiazem 120 MG tablet Commonly known as: CARDIZEM Take two tablets; oral Once A Day   Eliquis 5 MG Tabs tablet Generic drug: apixaban TAKE 1 TABLET TWICE A DAY (SCHEDULE FOLLOW UP WITH CARDIOLOGIST PRIOR TO NEXT REFILL REQUEST)   esomeprazole 40 MG capsule Commonly known as: NEXIUM TAKE 1 CAPSULE DAILY   ferrous sulfate 325 (65 FE) MG tablet Take 325 mg by mouth daily with breakfast.   Flutter Devi Use as directed.   furosemide 40 MG tablet Commonly known as: LASIX Take 40 mg by mouth daily.   losartan 100 MG tablet Commonly  known as: COZAAR Take 50 mg by mouth daily.   Magnesium 200 MG Tabs Take by mouth. 1 tablet once a day   MIRALAX PO Take 17 g by mouth daily as needed (Constipation).   Myrbetriq 25 MG Tb24 tablet Generic drug: mirabegron ER Take 25 mg by mouth daily.   Nitrostat 0.4 MG SL tablet Generic drug: nitroGLYCERIN USE AS NEEDED   tamsulosin 0.4 MG Caps capsule Commonly known as: FLOMAX Take 0.4 mg by mouth at bedtime.   Vitamin D3 25 MCG (1000 UT) Caps Take 1 capsule by mouth daily.       Review of Systems  Constitutional: Negative for activity change, appetite change, chills, diaphoresis, fatigue, fever and unexpected weight  change.  HENT: Positive for hearing loss. Negative for congestion and voice change.   Respiratory: Positive for shortness of breath. Negative for cough and wheezing.        DOE, O2 dependent.   Cardiovascular: Positive for leg swelling. Negative for chest pain and palpitations.  Gastrointestinal: Negative for abdominal distention, abdominal pain, constipation, diarrhea, nausea and vomiting.  Genitourinary: Negative for difficulty urinating, dysuria and urgency.  Musculoskeletal: Positive for arthralgias and gait problem.       Left knee pain  Skin: Negative for color change and pallor.  Neurological: Negative for dizziness, speech difficulty, weakness and headaches.  Psychiatric/Behavioral: Positive for dysphoric mood. Negative for agitation, behavioral problems, hallucinations and sleep disturbance. The patient is nervous/anxious.     Immunization History  Administered Date(s) Administered  . H1N1 04/13/2010  . Influenza, High Dose Seasonal PF 02/12/2014, 03/29/2018  . Influenza-Unspecified 03/06/2013, 02/12/2014, 03/06/2017  . Pneumococcal Conjugate-13 02/12/2014  . Pneumococcal Polysaccharide-23 02/04/2013, 02/12/2014  . Td 09/04/2005  . Tdap 12/11/2012  . Zoster 07/07/2006   Pertinent  Health Maintenance Due  Topic Date Due  . INFLUENZA  VACCINE  01/05/2019  . PNA vac Low Risk Adult  Completed   No flowsheet data found. Functional Status Survey:    Vitals:   05/22/19 0946  BP: 124/70  Pulse: 66  Resp: (!) 21  Temp: 98.2 F (36.8 C)  SpO2: 98%  Weight: 219 lb 3.2 oz (99.4 kg)  Height: 5\' 10"  (1.778 m)   Body mass index is 31.45 kg/m. Physical Exam Vitals and nursing note reviewed.  Constitutional:      General: He is not in acute distress.    Appearance: Normal appearance. He is obese. He is not ill-appearing, toxic-appearing or diaphoretic.  HENT:     Head: Normocephalic and atraumatic.     Nose: Nose normal.     Mouth/Throat:     Mouth: Mucous membranes are moist.  Eyes:     Extraocular Movements: Extraocular movements intact.     Conjunctiva/sclera: Conjunctivae normal.     Pupils: Pupils are equal, round, and reactive to light.  Cardiovascular:     Rate and Rhythm: Normal rate. Rhythm irregular.     Heart sounds: No murmur.  Pulmonary:     Breath sounds: No wheezing, rhonchi or rales.     Comments: O2 dependent, ILD Abdominal:     General: Bowel sounds are normal. There is no distension.     Palpations: Abdomen is soft.     Tenderness: There is no abdominal tenderness. There is no right CVA tenderness, left CVA tenderness, guarding or rebound.  Musculoskeletal:     Cervical back: Normal range of motion and neck supple.     Right lower leg: Edema present.     Left lower leg: Edema present.     Comments: Trace edema BLE  Skin:    General: Skin is warm and dry.  Neurological:     General: No focal deficit present.     Mental Status: He is alert and oriented to person, place, and time. Mental status is at baseline.     Motor: No weakness.     Coordination: Coordination normal.     Gait: Gait abnormal.  Psychiatric:        Mood and Affect: Mood normal.        Behavior: Behavior normal.        Thought Content: Thought content normal.        Judgment: Judgment normal.  Comments: Flat  affect.      Labs reviewed: Recent Labs    07/19/18 1505 01/30/19 1700 03/25/19 0000 03/26/19 0000 04/30/19 0000  NA 139 138 141 142 137  K 4.3 3.8 4.1 4.0 4.2  CL 106 101 104 103 99  CO2 29 26 28* 28* 29*  GLUCOSE 112* 143*  --   --   --   BUN 14 14 16 17 20   CREATININE 1.04 0.95 0.8 0.8 0.9  CALCIUM 8.7* 9.0 8.5* 8.4* 8.5*  MG 1.9  --   --   --   --    Recent Labs    01/30/19 1700 03/26/19 0000 04/30/19 0000  AST 14* 8* 9*  ALT 9 4* 4*  ALKPHOS 77 69 77  BILITOT 0.4  --   --   PROT 6.9  --   --   ALBUMIN 3.2* 3.5  --    Recent Labs    01/30/19 1700 03/26/19 0000 04/03/19 0000 04/30/19 0000  WBC 8.2 8.4 9.4 9.1  NEUTROABS 6.2  --  7,417 6,224  HGB 9.9* 9.4* 9.6* 10.7*  HCT 33.6* 30* 31* 34*  MCV 83.8  --   --   --   PLT 309 357 327 285   Lab Results  Component Value Date   TSH 2.00 02/24/2016   No results found for: HGBA1C Lab Results  Component Value Date   CHOL 108 04/30/2019   HDL 41 04/30/2019   LDLCALC 50 04/30/2019   TRIG 88 04/30/2019   CHOLHDL 2.9 02/12/2014    Significant Diagnostic Results in last 30 days:  No results found.  Assessment/Plan Anxiety Stable, will reduce Alprazolam 0.25mg  bid/tid prn, re-eval in one month.   Iron deficiency anemia Improved, Hgb 10.7 04/30/19, continue Fe daily.   Urinary frequency Stable, continue Myrbetriq, Tamsulosin.   Essential hypertension Controlled blood pressure, continue Losartan 50mg  qd.   Diastolic CHF (HCC) Chronic edema BLE, O2 dependent, continue Furosemide.   Persistent atrial fibrillation ([place]):  CHA2DS2-VASc Score 4; On Eliquis Heart rate is in controlled, continue Diltiazem 240mg  in in extended release form daily, continue Eliquis.   Chronic respiratory failure with hypoxia (HCC) ILD, O2 dependent.   GERD (gastroesophageal reflux disease) Stable, continue Esomeprazole.      Family/ staff Communication: plan of care reviewed with the patient and charge nurse.    Labs/tests ordered:  none  Time spend 25 minutes.

## 2019-05-22 NOTE — Assessment & Plan Note (Signed)
Stable, will reduce Alprazolam 0.25mg  bid/tid prn, re-eval in one month.

## 2019-05-22 NOTE — Assessment & Plan Note (Signed)
Controlled blood pressure, continue Losartan 50mg  qd.

## 2019-05-22 NOTE — Assessment & Plan Note (Signed)
Heart rate is in controlled, continue Diltiazem 240mg  in in extended release form daily, continue Eliquis.

## 2019-05-22 NOTE — Assessment & Plan Note (Signed)
ILD, O2 dependent.

## 2019-05-22 NOTE — Assessment & Plan Note (Signed)
Improved, Hgb 10.7 04/30/19, continue Fe daily.

## 2019-05-28 DIAGNOSIS — Z20828 Contact with and (suspected) exposure to other viral communicable diseases: Secondary | ICD-10-CM | POA: Diagnosis not present

## 2019-06-03 DIAGNOSIS — Z20828 Contact with and (suspected) exposure to other viral communicable diseases: Secondary | ICD-10-CM | POA: Diagnosis not present

## 2019-06-10 DIAGNOSIS — Z20828 Contact with and (suspected) exposure to other viral communicable diseases: Secondary | ICD-10-CM | POA: Diagnosis not present

## 2019-06-12 ENCOUNTER — Other Ambulatory Visit: Payer: Self-pay | Admitting: *Deleted

## 2019-06-12 MED ORDER — ALPRAZOLAM 0.25 MG PO TABS: 0.2500 mg | ORAL_TABLET | Freq: Two times a day (BID) | ORAL | 0 refills | Status: DC | PRN

## 2019-06-12 NOTE — Telephone Encounter (Signed)
Received fax refill from [facility] Pended Rx and sent to [facility] for approval.

## 2019-06-13 ENCOUNTER — Encounter: Payer: Self-pay | Admitting: Internal Medicine

## 2019-06-13 ENCOUNTER — Non-Acute Institutional Stay (SKILLED_NURSING_FACILITY): Payer: Medicare Other | Admitting: Internal Medicine

## 2019-06-13 DIAGNOSIS — D508 Other iron deficiency anemias: Secondary | ICD-10-CM | POA: Diagnosis not present

## 2019-06-13 DIAGNOSIS — J849 Interstitial pulmonary disease, unspecified: Secondary | ICD-10-CM | POA: Diagnosis not present

## 2019-06-13 DIAGNOSIS — E785 Hyperlipidemia, unspecified: Secondary | ICD-10-CM

## 2019-06-13 DIAGNOSIS — I5030 Unspecified diastolic (congestive) heart failure: Secondary | ICD-10-CM | POA: Diagnosis not present

## 2019-06-13 DIAGNOSIS — Z9861 Coronary angioplasty status: Secondary | ICD-10-CM

## 2019-06-13 DIAGNOSIS — R5381 Other malaise: Secondary | ICD-10-CM

## 2019-06-13 DIAGNOSIS — I4819 Other persistent atrial fibrillation: Secondary | ICD-10-CM | POA: Diagnosis not present

## 2019-06-13 DIAGNOSIS — I2510 Atherosclerotic heart disease of native coronary artery without angina pectoris: Secondary | ICD-10-CM | POA: Diagnosis not present

## 2019-06-13 NOTE — Progress Notes (Signed)
Location:   [place] Room Number: 5 Place of Service:  SNF [phone]) Provider:  Veleta Miners, MD  Virgie Dad, MD  Patient Care Team: Virgie Dad, MD as PCP - General (Internal Medicine) Leonie Man, MD as PCP - Cardiology (Cardiology)  Extended Emergency Contact Information Primary Emergency Contact: [facility] Address: [address]          [place], [place] [address] [name] of [place] Phone: [phone] Mobile Phone: [phone] Relation: Spouse Secondary Emergency Contact: Janey Genta Address: [address]          [place], [place] Phone: [phone] Mobile Phone: [phone] Relation: Daughter  Code Status:  Full Code Goals of care: Advanced Directive information Advanced Directives 10/19/2017  Does Patient Have a Medical Advance Directive? No  Type of Advance Directive -  Copy of [place] in Chart? -  Would patient like information on creating a medical advance directive? No - Patient declined  Pre-existing out of facility DNR order (yellow form or pink MOST form) -     Chief Complaint  Patient presents with  . Medical Management of Chronic Issues    Routine Visit    HPI:  Pt is a 86 y.o. male seen today for medical management of chronic diseases.    Patient has a history of CAD s/p PTCA, chronic atrial fibrillation on Eliquis, chronic diastolic CHF,Interstitial lung disease with hypoxia on chronic oxygen, hypertension, hyperlipidemia, OSA on CPAP, chronic back pain with spinal stenosis, S C-spine kyphosis, cognitive impairment  Lives in SNF.  Continues to be dependent for his ADLs.  Sleeps in his recliner. Not Mobile without help Does not have any acute Complains Does not use CPAP at night just his Oxygen.  Has Lost 10-15 pounds since been in Facility. Says his Appetite is good. No New Nursing issues He denied any cough or SOB.   Past Medical  History:  Diagnosis Date  . Anxiety   . CAD S/P percutaneous coronary angioplasty 1996, 2009, 01/2010   PTCA of L Cx 1996; BMS-RCA [facility]) 2009; Staged PCI RCA (70% ISR) --> 3.0 mm x 23 mm BMS, staged PCI Diag - 2.0 mm x 12 mm Mini Vision BMS (2.25 mm)  . Cataracts, bilateral    immature  . Chronic back pain    scoliosis--pt states unable to have surgery bc of age  . GERD (gastroesophageal reflux disease)    takes Nexium daily  . History of colon polyps   . Hyperlipidemia   . Hypertension   . Myocardial infarction [facility]) 1996, 2009   x 2;last time was about 8-50yrs ago   . Obesity (BMI 30.0-34.9)   . OSA (obstructive sleep apnea)   . Osteoarthritis of both knees   . Peripheral edema    Venous Stasis  . Persistent atrial fibrillation (HCC)    Unable to maintaine NSR after DCCV with Tikosyn.  Plan = rate control w/diltiazem (beta blocker off b/c fatigue). CHA2DS2Vasc 4. DOAC - Eliquis.   . Prostate cancer ([place]) 2005   seed implant   Past Surgical History:  Procedure Laterality Date  . CARDIOVERSION N/A 06/12/2015   Procedure: CARDIOVERSION;  Surgeon: Sanda Klein, MD;  Location: [place] ENDOSCOPY;  Service: Cardiovascular;  Laterality: N/A;  . CARDIOVERSION N/A 08/02/2015   Procedure: CARDIOVERSION;  Surgeon: [name], MD;  Location: [place];  Service: Cardiovascular;  Laterality: N/A;  . CHOLECYSTECTOMY    . COLONOSCOPY    .  CORONARY ANGIOPLASTY WITH STENT PLACEMENT  1996, 2009, August 2011   PTCA of L Cx 1996; BMS-RCA [facility]) 2009; Staged PCI RCA (70% ISR) --> 3.0 mm x 23 mm BMS, staged PCI Diag - 2.0 mm x 12 mm Mini Vision BMS (2.25 mm)  . cyst removed  in college  . NEUROPLASTY / TRANSPOSITION MEDIAN NERVE AT CARPAL TUNNEL BILATERAL    . NM MYOVIEW LTD  November 2013   EF 53%; fixed mid inferior-inferolateral defect, infarct with no ischemia.  . right knee arthrsoscopy  36yrs ago  . right trigger finger    . seeds placed into prostate   2005  . TEE WITHOUT CARDIOVERSION N/A  06/12/2015   Procedure: TRANSESOPHAGEAL ECHOCARDIOGRAM (TEE) - WITH CARDIOVERSION;  Surgeon: Sanda Klein, MD;  Location: [place];  Service: Cardiovascular: EF 55-60%.No LA or LAA thrombus. Normal Peal PAP ~33 mmHg.  [name] TOTAL KNEE ARTHROPLASTY  05/09/2012   Procedure: TOTAL KNEE ARTHROPLASTY;  Surgeon: Kerin Salen, MD;  Location: [place];  Service: Orthopedics;  Laterality: Right;  . TRANSTHORACIC ECHOCARDIOGRAM  06/2015   EF 60-65%. Mild LA dilation. Normal PA pressures: 32 mmHg. Aortic sclerosis but no stenosis.  [name] wisdom teeth extraccted      Allergies  Allergen Reactions  . Crab [Shellfish Allergy] Rash  . Hydrocodone Hives  . Adhesive [Tape] Rash  . Oxycodone Rash    Allergies as of 06/13/2019      Reactions   Crab [shellfish Allergy] Rash   Hydrocodone Hives   Adhesive [tape] Rash   Oxycodone Rash      Medication List       Accurate as of June 13, 2019  3:20 PM. If you have any questions, ask your nurse or doctor.        STOP taking these medications   Biofreeze 4 % Gel Generic drug: Menthol (Topical Analgesic) Stopped by: Virgie Dad, MD   Flutter Devi Stopped by: Virgie Dad, MD     TAKE these medications   acetaminophen 500 MG tablet Commonly known as: TYLENOL Take 1,000 mg by mouth. Three times a day as needed   ALPRAZolam 0.25 MG tablet Commonly known as: XANAX Take 1 tablet (0.25 mg total) by mouth 2 (two) times daily as needed for anxiety.   atorvastatin 40 MG tablet Commonly known as: LIPITOR TAKE 1 TABLET DAILY AT BEDTIME   barrier cream Crea Commonly known as: non-specified Apply 1 application topically. Zinc Oxide to buttocks area when assisting resident with toileting for redness every shift   diltiazem 240 MG 24 hr capsule Commonly known as: DILACOR XR Take 240 mg by mouth daily. For heart failure What changed: Another medication with the same name was removed. Continue taking this medication, and follow the directions you see  here. Changed by: Virgie Dad, MD   Eliquis 5 MG Tabs tablet Generic drug: apixaban TAKE 1 TABLET TWICE A DAY (SCHEDULE FOLLOW UP WITH CARDIOLOGIST PRIOR TO NEXT REFILL REQUEST)   esomeprazole 40 MG capsule Commonly known as: NEXIUM TAKE 1 CAPSULE DAILY   ferrous sulfate 325 (65 FE) MG tablet Take 325 mg by mouth daily with breakfast.   furosemide 40 MG tablet Commonly known as: LASIX Take 40 mg by mouth daily.   losartan 100 MG tablet Commonly known as: COZAAR Take 50 mg by mouth daily.   magnesium oxide 400 MG tablet Commonly known as: MAG-OX Take 200 mg by mouth daily.   MIRALAX PO Take 17 g by mouth daily.  Myrbetriq 25 MG Tb24 tablet Generic drug: mirabegron ER Take 25 mg by mouth daily.   Nitrostat 0.4 MG SL tablet Generic drug: nitroGLYCERIN USE AS NEEDED   OXYGEN Inhale 2 L into the lungs continuous. For obstructive sleep apnea   tamsulosin 0.4 MG Caps capsule Commonly known as: FLOMAX Take 0.4 mg by mouth at bedtime.   Vitamin D3 25 MCG (1000 UT) Caps Take 1 capsule by mouth daily.       Review of Systems  Review of Systems  Constitutional: Negative for activity change, appetite change, chills, diaphoresis, fatigue and fever.  HENT: Negative for mouth sores, postnasal drip, rhinorrhea, sinus pain and sore throat.   Respiratory: Negative for apnea, cough, chest tightness, shortness of breath and wheezing.   Cardiovascular: Negative for chest pain, palpitations and leg swelling.  Gastrointestinal: Negative for abdominal distention, abdominal pain, constipation, diarrhea, nausea and vomiting.  Genitourinary: Negative for dysuria and frequency.  Musculoskeletal: Negative for arthralgias, joint swelling and myalgias.  Skin: Negative for rash.  Neurological: Negative for dizziness, syncope, weakness, light-headedness and numbness.  Psychiatric/Behavioral: Negative for behavioral problems, confusion and sleep disturbance.     Immunization History   Administered Date(s) Administered  . H1N1 04/13/2010  . Influenza, High Dose Seasonal PF 02/12/2014, 03/29/2018  . Influenza-Unspecified 03/06/2013, 02/12/2014, 03/06/2017  . PFIZER SARS-COV-2 Vaccination 06/08/2019  . Pneumococcal Conjugate-13 02/12/2014  . Pneumococcal Polysaccharide-23 02/04/2013, 02/12/2014  . Td 09/04/2005  . Tdap 12/11/2012  . Zoster 07/07/2006   Pertinent  Health Maintenance Due  Topic Date Due  . INFLUENZA VACCINE  01/05/2019  . PNA vac Low Risk Adult  Completed   No flowsheet data found. Functional Status Survey:    Vitals:   06/13/19 1515  BP: 118/68  Pulse: 78  Resp: 20  Temp: (!) 97.1 F (36.2 C)  SpO2: 96%  Weight: 216 lb (98 kg)  Height: 5\' 10"  (1.778 m)   Body mass index is 30.99 kg/m. Physical Exam Vitals reviewed.  Constitutional:      Appearance: He is obese.  HENT:     Head: Normocephalic.     Nose: Nose normal.     Mouth/Throat:     Mouth: Mucous membranes are moist.     Pharynx: Oropharynx is clear.  Eyes:     Pupils: Pupils are equal, round, and reactive to light.  Cardiovascular:     Rate and Rhythm: Normal rate and regular rhythm.     Pulses: Normal pulses.     Heart sounds: Normal heart sounds.  Pulmonary:     Effort: Pulmonary effort is normal.     Breath sounds: Normal breath sounds.  Abdominal:     General: Abdomen is flat. Bowel sounds are normal.     Palpations: Abdomen is soft.  Musculoskeletal:        General: Swelling present.     Cervical back: Neck supple.     Comments: Chronic Venous Changes with Edema  Skin:    General: Skin is warm and dry.  Neurological:     General: No focal deficit present.     Mental Status: He is alert.  Psychiatric:        Mood and Affect: Mood normal.        Thought Content: Thought content normal.       Labs reviewed: Recent Labs    07/19/18 1505 01/30/19 1700 03/25/19 0000 03/26/19 0000 04/30/19 0000  NA 139 138 141 142 137  K 4.3 3.8 4.1 4.0 4.2  CL  106  101 104 103 99  CO2 29 26 28* 28* 29*  GLUCOSE 112* 143*  --   --   --   BUN 14 14 16 17 20   CREATININE 1.04 0.95 0.8 0.8 0.9  CALCIUM 8.7* 9.0 8.5* 8.4* 8.5*  MG 1.9  --   --   --   --    Recent Labs    01/30/19 1700 03/26/19 0000 04/30/19 0000  AST 14* 8* 9*  ALT 9 4* 4*  ALKPHOS 77 69 77  BILITOT 0.4  --   --   PROT 6.9  --   --   ALBUMIN 3.2* 3.5  --    Recent Labs    01/30/19 1700 03/26/19 0000 04/03/19 0000 04/30/19 0000  WBC 8.2 8.4 9.4 9.1  NEUTROABS 6.2  --  7,417 6,224  HGB 9.9* 9.4* 9.6* 10.7*  HCT 33.6* 30* 31* 34*  MCV 83.8  --   --   --   PLT 309 357 327 285   Lab Results  Component Value Date   TSH 2.00 02/24/2016   No results found for: HGBA1C Lab Results  Component Value Date   CHOL 108 04/30/2019   HDL 41 04/30/2019   LDLCALC 50 04/30/2019   TRIG 88 04/30/2019   CHOLHDL 2.9 02/12/2014    Significant Diagnostic Results in last 30 days:  No results found.  Assessment/Plan ILD (interstitial lung disease) ([place]) On Chronic Oxygen Repeat CT due in 6/20  Persistent atrial fibrillation ([place]):  CHA2DS2-VASc Score 4; On Eliquis On Cardizem  Rate Controlled Hypertension ON Cozar and Cardizem Diastolic congestive heart failure, unspecified HF chronicity (HCC) Continue on Lasix  Bun and Creat stable Physical deconditioning Does not work with Therapy anymore Mostly Wheelchair dependent  Hyperlipidemia with target LDL less than 70 LDL 50 on Lipitor Iron deficiency anemia On Iron  Hgb Stable Iron Sats were 8 CAD S/P percutaneous coronary angioplasty Follows with Cardiology On Statin OSA Refuses to use CPAP Urinary Incontinence ON Flomax and Myrbetriq Anxiety Will continue low doe of Xanax  Not candidate for [employer] staff Communication:   Labs/tests ordered:    Total time spent in this patient care encounter was  25_  minutes; greater than 50% of the visit spent counseling patient and staff, reviewing records , Labs and  coordinating care for problems addressed at this encounter.

## 2019-06-17 DIAGNOSIS — Z20828 Contact with and (suspected) exposure to other viral communicable diseases: Secondary | ICD-10-CM | POA: Diagnosis not present

## 2019-06-24 DIAGNOSIS — Z20828 Contact with and (suspected) exposure to other viral communicable diseases: Secondary | ICD-10-CM | POA: Diagnosis not present

## 2019-07-01 DIAGNOSIS — Z20828 Contact with and (suspected) exposure to other viral communicable diseases: Secondary | ICD-10-CM | POA: Diagnosis not present

## 2019-07-03 ENCOUNTER — Ambulatory Visit (INDEPENDENT_AMBULATORY_CARE_PROVIDER_SITE_OTHER): Payer: Medicare Other | Admitting: Primary Care

## 2019-07-03 ENCOUNTER — Encounter: Payer: Self-pay | Admitting: Nurse Practitioner

## 2019-07-03 ENCOUNTER — Other Ambulatory Visit: Payer: Self-pay

## 2019-07-03 ENCOUNTER — Encounter: Payer: Self-pay | Admitting: Primary Care

## 2019-07-03 ENCOUNTER — Non-Acute Institutional Stay (SKILLED_NURSING_FACILITY): Payer: Medicare Other | Admitting: Nurse Practitioner

## 2019-07-03 DIAGNOSIS — I10 Essential (primary) hypertension: Secondary | ICD-10-CM

## 2019-07-03 DIAGNOSIS — J9611 Chronic respiratory failure with hypoxia: Secondary | ICD-10-CM | POA: Diagnosis not present

## 2019-07-03 DIAGNOSIS — G3184 Mild cognitive impairment, so stated: Secondary | ICD-10-CM

## 2019-07-03 DIAGNOSIS — F419 Anxiety disorder, unspecified: Secondary | ICD-10-CM | POA: Diagnosis not present

## 2019-07-03 DIAGNOSIS — J849 Interstitial pulmonary disease, unspecified: Secondary | ICD-10-CM | POA: Diagnosis not present

## 2019-07-03 DIAGNOSIS — K219 Gastro-esophageal reflux disease without esophagitis: Secondary | ICD-10-CM

## 2019-07-03 DIAGNOSIS — I4819 Other persistent atrial fibrillation: Secondary | ICD-10-CM | POA: Diagnosis not present

## 2019-07-03 DIAGNOSIS — I5030 Unspecified diastolic (congestive) heart failure: Secondary | ICD-10-CM

## 2019-07-03 DIAGNOSIS — R350 Frequency of micturition: Secondary | ICD-10-CM | POA: Diagnosis not present

## 2019-07-03 DIAGNOSIS — G8929 Other chronic pain: Secondary | ICD-10-CM

## 2019-07-03 DIAGNOSIS — D508 Other iron deficiency anemias: Secondary | ICD-10-CM | POA: Diagnosis not present

## 2019-07-03 DIAGNOSIS — K5900 Constipation, unspecified: Secondary | ICD-10-CM

## 2019-07-03 DIAGNOSIS — M25562 Pain in left knee: Secondary | ICD-10-CM

## 2019-07-03 DIAGNOSIS — M1711 Unilateral primary osteoarthritis, right knee: Secondary | ICD-10-CM

## 2019-07-03 NOTE — Progress Notes (Signed)
Virtual Visit via Telephone Note  I connected with Jerry [name] on 07/03/19 at 11:30 AM EST by telephone and verified that I am speaking with the correct person using two identifiers.  Location: Patient: Home Provider: Office   I discussed the limitations, risks, security and privacy concerns of performing an evaluation and management service by telephone and the availability of in person appointments. I also discussed with the patient that there may be a patient responsible charge related to this service. The patient expressed understanding and agreed to proceed.   History of Present Illness: 86 year old male, former smoker. PMH significant for ILD, chronic respiratory failure with hypoxia, OSA, nocturnal hypoxia, chronic pedal edema, afib, CAD. Patient of Dr. Elsworth Soho. Maintained on CPAP and portable oxygen. Not a candidate for anti-fiboric therapies and is not interested d/t SE's. Referred to pulmonary rehab.   Previous [facility] pulmonary encounter: 06/27/2018 Patient presents today for 3 month follow-up visit. Accompanied by his wife. Moved 1 week ago to independent living. Some difficulty/confusion keeping straight the amount of help he is getting at the facility. He has seen social workers, nurses and dieticians. Breathing has been fine, no significant change. Some fatigue with activities, O2 drops when off oxygen. Still on 2L O2 24/7. Wearing CPAP on occasion, states that it is annoying to wear. States that he can do better. Taking xanax as needed, some days he has taken it twice for anxiety.  Not currently receiving pulmonary rehab. Denies cough or wheeze.   03/29/2019 Patient presents today for 6 month follow-up. Accompanied by his wife. He is doing well, no acute complaints.  Living at an assisted living facility. It is very difficult for him to get to appointments d/t mobility. Breathing is at baseline. Uses 2L oxygen continuous. He still has chronic lower extremity edema which he takes  lasix for. He is not currently wearing his cpap mask. He sleeps in a recliner, which he prefers to stay in during the day as well. Starting physical therapy at facility.    07/03/2019  Patient contacted today for routine 3 month follow-up. He is currently living at [facility] home. His wife is also there but in a different building. States that he is doing well, feels at baseline health. No issues with shortness of breath or cough. Reports that he is wearing his oxygen 24/7. Does not sound like he is wearing CPAP at night but he is wearing his oxygen. He reports that he is sleeping well in recliner chair. States that his appetite is good. Not currently getting physical therapy. He uses motorized wheelchair to get around. He is not particularly motivated to do much more. He is pretty content with how he is doing. Denies falls. Had first covid vaccine at facility.   Observations/Objective:  - Able to speak in full sentences - No shortness of breath, cough or wheezing noted during phone conversation  Significant testing: 11/23/18 CT chest- Spectrum of findings compatible with basilar predominant fibroticinterstitial lung disease Without interval progression since 09/11/2017.New scattered right upper lobe vague subsolid pulmonary nodules, largest 1.0 cm, favor inflammatory.NeedsChest CT with IV contrast follow-up in 3-6 months is recommended  09/2017 HRCT- Pulmonary parenchymal pattern of mild subpleural fibrosis may be due to nonspecific interstitial pneumonitis or usual interstitial pneumonitis. 4 mm subpleural right middle lobe nodule, likely a subpleural lymph node  01/2018 PFT - FVC 2.72 (69%), FEV1 2.27 (82%), ratio 84, DLCOunc 42  Assessment and Plan:  ILD: - Stable interval, no respiratory symptoms - Not  candidate for anti-fibrotics and patient is not interested d/t SEs - HRCT in October 2020 showed no change since April 2019 - Continue to monitor symptoms; patient unlikely to  participate in additional testing  - Recommend he stay as active as possible  - Received first dose of COVID vaccine January 2021 at facility   Chronic respiratory failure with hypoxia: - Continue to wear 2L oxygen 24/L  OSA: - HST 2019 showed moderate sleep apnea, AHI 22/hr  - Not currently wearing CPAP, recommend patient resume if able - Continue nocturnal oxygen   Follow Up Instructions:   -4 months with Dr. Elsworth Soho or NP  I discussed the assessment and treatment plan with the patient. The patient was provided an opportunity to ask questions and all were answered. The patient agreed with the plan and demonstrated an understanding of the instructions.   The patient was advised to call back or seek an in-person evaluation if the symptoms worsen or if the condition fails to improve as anticipated.  I provided 22 minutes of non-face-to-face time during this encounter.   Jerry Ehrich, NP

## 2019-07-03 NOTE — Progress Notes (Signed)
Location:   [facility] Room Number: 5 Place of Service:  SNF (31) Provider:  Bridgette Wolden NP  Virgie Dad, MD  Patient Care Team: Virgie Dad, MD as PCP - General (Internal Medicine) Leonie Arlo Buffone, MD as PCP - Cardiology (Cardiology)  Extended Emergency Contact Information Primary Emergency Contact: [facility] Address: [address]          [place], [place] [address] [name] of [place] Phone: [phone] Mobile Phone: [phone] Relation: Spouse Secondary Emergency Contact: Janey Genta Address: [address]          [place], [place] Phone: [phone] Mobile Phone: [phone] Relation: Daughter  Code Status:  Full Code Goals of care: Advanced Directive information Advanced Directives 07/03/2019  Does Patient Have a Medical Advance Directive? Yes  Type of Advance Directive Living will;Healthcare Power of Attorney  Does patient want to make changes to medical advance directive? No - Patient declined  Copy of [place] in Chart? Yes - validated most recent copy scanned in chart (See row information)  Would patient like information on creating a medical advance directive? -  Pre-existing out of facility DNR order (yellow form or pink MOST form) -     Chief Complaint  Patient presents with  . Medical Management of Chronic Issues  . Health Maintenance    Influenza vaccine    HPI:  Pt is a 86 y.o. male seen today for medical management of chronic diseases.    The patient resides in SNF [facility] for safety, care assistance, ambulates with walker, chronic L knee pain, on Tylenol 1000mg  tid prn. O2 dependent, his mood is stable, on Alprazolam bid. Afib, heart rate is in control, on Diltiazem 240mg  qd, Eliquis 5mg  bid. GERD, stable, on Esomeprazole 40mg  qd. Anemia, stable, on Fe qd. BLE edema/CHF, stable, on Furosemide 40mg  qd. HTN, blood pressure is controlled on Losartan 50mg  qd,  Diltiazem. Urinary frequency, stable, on Myrbetriq 25mg  qd, Tamsulosin 0.4mg  qd. Constipation, stable, on MiraLax qd.    Past Medical History:  Diagnosis Date  . Anxiety   . CAD S/P percutaneous coronary angioplasty 1996, 2009, 01/2010   PTCA of L Cx 1996; BMS-RCA [facility]) 2009; Staged PCI RCA (70% ISR) --> 3.0 mm x 23 mm BMS, staged PCI Diag - 2.0 mm x 12 mm Mini Vision BMS (2.25 mm)  . Cataracts, bilateral    immature  . Chronic back pain    scoliosis--pt states unable to have surgery bc of age  . GERD (gastroesophageal reflux disease)    takes Nexium daily  . History of colon polyps   . Hyperlipidemia   . Hypertension   . Myocardial infarction [facility]) 1996, 2009   x 2;last time was about 8-42yrs ago   . Obesity (BMI 30.0-34.9)   . OSA (obstructive sleep apnea)   . Osteoarthritis of both knees   . Peripheral edema    Venous Stasis  . Persistent atrial fibrillation (HCC)    Unable to maintaine NSR after DCCV with Tikosyn.  Plan = rate control w/diltiazem (beta blocker off b/c fatigue). CHA2DS2Vasc 4. DOAC - Eliquis.   . Prostate cancer ([place]) 2005   seed implant   Past Surgical History:  Procedure Laterality Date  . CARDIOVERSION N/A 06/12/2015   Procedure: CARDIOVERSION;  Surgeon: Sanda Klein, MD;  Location: [facility];  Service: Cardiovascular;  Laterality: N/A;  . CARDIOVERSION N/A 08/02/2015   Procedure: CARDIOVERSION;  Surgeon: [name], MD;  Location: [facility];  Service: Cardiovascular;  Laterality: N/A;  . CHOLECYSTECTOMY    . COLONOSCOPY    . CORONARY ANGIOPLASTY WITH STENT PLACEMENT  1996, 2009, August 2011   PTCA of L Cx 1996; BMS-RCA [facility]) 2009; Staged PCI RCA (70% ISR) --> 3.0 mm x 23 mm BMS, staged PCI Diag - 2.0 mm x 12 mm Mini Vision BMS (2.25 mm)  . cyst removed  in college  . NEUROPLASTY / TRANSPOSITION MEDIAN NERVE AT CARPAL TUNNEL BILATERAL    . NM MYOVIEW LTD  November 2013   EF 53%; fixed mid inferior-inferolateral defect, infarct with no ischemia.  .  right knee arthrsoscopy  33yrs ago  . right trigger finger    . seeds placed into prostate   2005  . TEE WITHOUT CARDIOVERSION N/A 06/12/2015   Procedure: TRANSESOPHAGEAL ECHOCARDIOGRAM (TEE) - WITH CARDIOVERSION;  Surgeon: Sanda Klein, MD;  Location: [facility];  Service: Cardiovascular: EF 55-60%.No LA or LAA thrombus. Normal Peal PAP ~33 mmHg.  [name] TOTAL KNEE ARTHROPLASTY  05/09/2012   Procedure: TOTAL KNEE ARTHROPLASTY;  Surgeon: Kerin Salen, MD;  Location: [facility];  Service: Orthopedics;  Laterality: Right;  . TRANSTHORACIC ECHOCARDIOGRAM  06/2015   EF 60-65%. Mild LA dilation. Normal PA pressures: 32 mmHg. Aortic sclerosis but no stenosis.  [name] wisdom teeth extraccted      Allergies  Allergen Reactions  . Crab [Shellfish Allergy] Rash  . Hydrocodone Hives  . Adhesive [Tape] Rash  . Oxycodone Rash    Allergies as of 07/03/2019      Reactions   Crab [shellfish Allergy] Rash   Hydrocodone Hives   Adhesive [tape] Rash   Oxycodone Rash      Medication List       Accurate as of July 03, 2019 11:59 PM. If you have any questions, ask your nurse or doctor.        acetaminophen 500 MG tablet Commonly known as: TYLENOL Take 1,000 mg by mouth. Three times a day as needed   ALPRAZolam 0.25 MG tablet Commonly known as: XANAX Take 1 tablet (0.25 mg total) by mouth 2 (two) times daily as needed for anxiety.   atorvastatin 40 MG tablet Commonly known as: LIPITOR TAKE 1 TABLET DAILY AT BEDTIME   barrier cream Crea Commonly known as: non-specified Apply 1 application topically. Zinc Oxide to buttocks area when assisting resident with toileting for redness every shift   diltiazem 240 MG 24 hr capsule Commonly known as: DILACOR XR Take 240 mg by mouth daily. For heart failure   Eliquis 5 MG Tabs tablet Generic drug: apixaban TAKE 1 TABLET TWICE A DAY (SCHEDULE FOLLOW UP WITH CARDIOLOGIST PRIOR TO NEXT REFILL REQUEST)   esomeprazole 40 MG capsule Commonly known as:  NEXIUM TAKE 1 CAPSULE DAILY   ferrous sulfate 325 (65 FE) MG tablet Take 325 mg by mouth daily with breakfast.   furosemide 40 MG tablet Commonly known as: LASIX Take 40 mg by mouth daily.   losartan 100 MG tablet Commonly known as: COZAAR Take 50 mg by mouth daily.   magnesium oxide 400 MG tablet Commonly known as: MAG-OX Take 200 mg by mouth daily.   MIRALAX PO Take 17 g by mouth daily.   Myrbetriq 25 MG Tb24 tablet Generic drug: mirabegron ER Take 25 mg by mouth daily.   Nitrostat 0.4 MG SL tablet Generic drug: nitroGLYCERIN USE AS NEEDED   OXYGEN Inhale 2 L into the lungs continuous. For obstructive sleep apnea   tamsulosin  0.4 MG Caps capsule Commonly known as: FLOMAX Take 0.4 mg by mouth at bedtime.   Vitamin D3 25 MCG (1000 UT) Caps Take 1 capsule by mouth daily.      ROS was provided with assistance of staff.  Review of Systems  Constitutional: Negative for activity change, appetite change, chills, diaphoresis, fatigue, fever and unexpected weight change.  HENT: Positive for hearing loss. Negative for congestion and voice change.   Eyes: Negative for visual disturbance.  Respiratory: Positive for shortness of breath. Negative for cough and wheezing.   Cardiovascular: Positive for leg swelling.  Gastrointestinal: Negative for abdominal distention, constipation, diarrhea, nausea and vomiting.  Genitourinary: Negative for difficulty urinating, dysuria and urgency.  Musculoskeletal: Positive for arthralgias and gait problem.  Skin: Negative for color change and pallor.  Neurological: Negative for dizziness, speech difficulty, weakness and headaches.       Memory lapses.   Psychiatric/Behavioral: Positive for dysphoric mood. Negative for agitation, behavioral problems, hallucinations and sleep disturbance. The patient is not nervous/anxious.     Immunization History  Administered Date(s) Administered  . H1N1 04/13/2010  . Influenza, High Dose Seasonal PF  02/12/2014, 03/29/2018, 01/05/2019  . Influenza-Unspecified 03/06/2013, 02/12/2014, 03/06/2017  . Moderna SARS-COVID-2 Vaccination 06/08/2019  . PFIZER SARS-COV-2 Vaccination 06/08/2019  . Pneumococcal Conjugate-13 02/12/2014  . Pneumococcal Polysaccharide-23 02/04/2013, 02/12/2014  . Td 09/04/2005  . Tdap 12/11/2012  . Zoster 07/07/2006   Pertinent  Health Maintenance Due  Topic Date Due  . INFLUENZA VACCINE  Completed  . PNA vac Low Risk Adult  Completed   No flowsheet data found. Functional Status Survey:    Vitals:   07/03/19 0924  BP: (!) 142/66  Pulse: 72  Resp: 20  Temp: (!) 97.3 F (36.3 C)  SpO2: 98%  Weight: 216 lb (98 kg)  Height: 5\' 10"  (1.778 m)   Body mass index is 30.99 kg/m. Physical Exam Vitals and nursing note reviewed.  Constitutional:      General: He is not in acute distress.    Appearance: Normal appearance. He is obese. He is not ill-appearing, toxic-appearing or diaphoretic.  HENT:     Head: Normocephalic and atraumatic.     Mouth/Throat:     Mouth: Mucous membranes are moist.  Eyes:     Extraocular Movements: Extraocular movements intact.     Conjunctiva/sclera: Conjunctivae normal.     Pupils: Pupils are equal, round, and reactive to light.  Cardiovascular:     Rate and Rhythm: Normal rate. Rhythm irregular.     Heart sounds: No murmur.  Pulmonary:     Breath sounds: No wheezing, rhonchi or rales.  Abdominal:     General: Bowel sounds are normal. There is no distension.     Palpations: Abdomen is soft.     Tenderness: There is no abdominal tenderness. There is no right CVA tenderness, left CVA tenderness, guarding or rebound.  Musculoskeletal:     Cervical back: Normal range of motion and neck supple.     Right lower leg: Edema present.     Left lower leg: Edema present.     Comments: Trace edema BLE  Skin:    General: Skin is warm and dry.     Comments: Venous insufficiency skin changes BLE  Neurological:     General: No focal  deficit present.     Mental Status: He is alert and oriented to person, place, and time. Mental status is at baseline.     Motor: No weakness.  Coordination: Coordination normal.     Gait: Gait abnormal.  Psychiatric:        Mood and Affect: Mood normal.        Behavior: Behavior normal.        Thought Content: Thought content normal.        Judgment: Judgment normal.     Comments: Sad facial looks.      Labs reviewed: Recent Labs    07/19/18 1505 07/19/18 1505 01/30/19 1700 01/30/19 1700 03/25/19 0000 03/26/19 0000 04/30/19 0000  NA 139   < > 138  --  141 142 137  K 4.3   < > 3.8   < > 4.1 4.0 4.2  CL 106   < > 101   < > 104 103 99  CO2 29   < > 26   < > 28* 28* 29*  GLUCOSE 112*  --  143*  --   --   --   --   BUN 14   < > 14  --  16 17 20   CREATININE 1.04   < > 0.95  --  0.8 0.8 0.9  CALCIUM 8.7*   < > 9.0   < > 8.5* 8.4* 8.5*  MG 1.9  --   --   --   --   --   --    < > = values in this interval not displayed.   Recent Labs    01/30/19 1700 03/26/19 0000 04/30/19 0000  AST 14* 8* 9*  ALT 9 4* 4*  ALKPHOS 77 69 77  BILITOT 0.4  --   --   PROT 6.9  --   --   ALBUMIN 3.2* 3.5  --    Recent Labs    01/30/19 1700 01/30/19 1700 03/26/19 0000 04/03/19 0000 04/30/19 0000  WBC 8.2  --  8.4 9.4 9.1  NEUTROABS 6.2  --   --  7,417 6,224  HGB 9.9*   < > 9.4* 9.6* 10.7*  HCT 33.6*   < > 30* 31* 34*  MCV 83.8  --   --   --   --   PLT 309   < > 357 327 285   < > = values in this interval not displayed.   Lab Results  Component Value Date   TSH 2.00 02/24/2016   No results found for: HGBA1C Lab Results  Component Value Date   CHOL 108 04/30/2019   HDL 41 04/30/2019   LDLCALC 50 04/30/2019   TRIG 88 04/30/2019   CHOLHDL 2.9 02/12/2014    Significant Diagnostic Results in last 30 days:  No results found.  Assessment/Plan Diastolic CHF (HCC) Trace edema in BLE, O2 dependent, continue Furosemide.   Essential hypertension Blood pressure is  controlled, continue Diltiazem, Losartan.   Persistent atrial fibrillation ([place]):  CHA2DS2-VASc Score 4; On Eliquis Heart rate is in control, continue Diltiazem, Eliquis.   Chronic respiratory failure with hypoxia (HCC) Related to ILD, CHF, O2 via [place] to maintain Sat O2>90%  GERD (gastroesophageal reflux disease) Stable, continue Esomeprazole.   MCI (mild cognitive impairment) with memory loss Functioning well in SNF [facility]  Osteoarthritis of right knee Continue Tylenol prn.   Anxiety Sleeps well, prn Alprazolam is adequate.   Constipation Stable, continue MiraLax   Iron deficiency anemia Stable, Hgb 10.7 04/29/20, continue Fe  Left knee pain Continue Tylenol.   Urinary frequency No urinary retention, continue Myrbetriq, Tamsulosin.      Family/ staff Communication: plan of  care reviewed with the patient and charge nurse.   Labs/tests ordered:  None  Time spend 25 minutes.

## 2019-07-03 NOTE — Patient Instructions (Addendum)
Interstitial lung disease - Continue to monitor for worsening shortness of breath or cough - Stay as active as possible - Get second dose of COVID vaccine when due  Chronic respiratory failure - Continue 2L oxygen 24/7 - Titrate to keep > 88-90%  OSA: - Recommend wearing CPAP every night if you still have device   Follow-up: -4 months with Dr. Elsworth Soho

## 2019-07-08 DIAGNOSIS — Z20828 Contact with and (suspected) exposure to other viral communicable diseases: Secondary | ICD-10-CM | POA: Diagnosis not present

## 2019-07-12 ENCOUNTER — Encounter: Payer: Self-pay | Admitting: Nurse Practitioner

## 2019-07-12 NOTE — Assessment & Plan Note (Signed)
No urinary retention, continue Myrbetriq, Tamsulosin.

## 2019-07-12 NOTE — Assessment & Plan Note (Signed)
Continue Tylenol.

## 2019-07-12 NOTE — Assessment & Plan Note (Signed)
Trace edema in BLE, O2 dependent, continue Furosemide.

## 2019-07-12 NOTE — Assessment & Plan Note (Signed)
Functioning well in SNF [facility]

## 2019-07-12 NOTE — Assessment & Plan Note (Signed)
Stable, continue Esomeprazole.  

## 2019-07-12 NOTE — Assessment & Plan Note (Addendum)
Stable, Hgb 10.7 04/29/20, continue Fe

## 2019-07-12 NOTE — Assessment & Plan Note (Addendum)
Related to ILD, CHF, O2 via [place] to maintain Sat O2>90%

## 2019-07-12 NOTE — Assessment & Plan Note (Signed)
Blood pressure is controlled, continue Diltiazem, Losartan 

## 2019-07-12 NOTE — Assessment & Plan Note (Signed)
Heart rate is in control, continue Diltiazem, Eliquis.  °

## 2019-07-12 NOTE — Assessment & Plan Note (Signed)
Sleeps well, prn Alprazolam is adequate.

## 2019-07-12 NOTE — Assessment & Plan Note (Signed)
Stable, continue MiraLax.  °

## 2019-07-12 NOTE — Assessment & Plan Note (Signed)
Continue Tylenol prn 

## 2019-07-15 DIAGNOSIS — Z20828 Contact with and (suspected) exposure to other viral communicable diseases: Secondary | ICD-10-CM | POA: Diagnosis not present

## 2019-07-18 ENCOUNTER — Encounter: Payer: Self-pay | Admitting: Internal Medicine

## 2019-07-18 ENCOUNTER — Non-Acute Institutional Stay (SKILLED_NURSING_FACILITY): Payer: Medicare Other | Admitting: Internal Medicine

## 2019-07-18 DIAGNOSIS — E669 Obesity, unspecified: Secondary | ICD-10-CM | POA: Diagnosis not present

## 2019-07-18 DIAGNOSIS — J849 Interstitial pulmonary disease, unspecified: Secondary | ICD-10-CM

## 2019-07-18 DIAGNOSIS — I4819 Other persistent atrial fibrillation: Secondary | ICD-10-CM | POA: Diagnosis not present

## 2019-07-18 DIAGNOSIS — G8929 Other chronic pain: Secondary | ICD-10-CM

## 2019-07-18 DIAGNOSIS — M545 Low back pain: Secondary | ICD-10-CM

## 2019-07-18 MED ORDER — TRAMADOL HCL 50 MG PO TABS: 50.0000 mg | ORAL_TABLET | Freq: Two times a day (BID) | ORAL | 0 refills | Status: DC

## 2019-07-18 NOTE — Progress Notes (Signed)
Location: [place] Room Number: [place] of Service:  SNF [phone])  Provider: Veleta Miners, MD  Code Status: FULL CODE Goals of Care:  Advanced Directives 07/03/2019  Does Patient Have a Medical Advance Directive? Yes  Type of Advance Directive Living will;Healthcare Power of Attorney  Does patient want to make changes to medical advance directive? No - Patient declined  Copy of [place] in Chart? Yes - validated most recent copy scanned in chart (See row information)  Would patient like information on creating a medical advance directive? -  Pre-existing out of facility DNR order (yellow form or pink MOST form) -     Chief Complaint  Patient presents with  . Acute Visit    Pateint is seen for lower back pain.    HPI: Patient is an 86 y.o. male seen today for an acute visit for Lower Back Pain  Patient has a history of CAD s/p PTCA, chronic atrial fibrillation on Eliquis, chronic diastolic CHF,Interstitial lung disease with hypoxia on chronic oxygen, hypertension, hyperlipidemia, OSA on CPAP, chronic back pain with spinal stenosis, S C-spine kyphosis, cognitive impairment  He has h/o Chronic Back pain. Is on Tylenol but was on Tramadol PRN before He has been having more pain not relieved by Tylenol and Wants to know if he can have Something Stronger. He has not had any recent falls. Completely dependent for his ADLS Stays mostly in his Recliner. Pain is in the Lower back with Not radiating.   Past Medical History:  Diagnosis Date  . Anxiety   . CAD S/P percutaneous coronary angioplasty 1996, 2009, 01/2010   PTCA of L Cx 1996; BMS-RCA [facility]) 2009; Staged PCI RCA (70% ISR) --> 3.0 mm x 23 mm BMS, staged PCI Diag - 2.0 mm x 12 mm Mini Vision BMS (2.25 mm)  . Cataracts, bilateral    immature  . Chronic back pain    scoliosis--pt states unable to have surgery bc of age  . GERD (gastroesophageal reflux disease)    takes Nexium daily   . History of colon polyps   . Hyperlipidemia   . Hypertension   . Myocardial infarction [facility]) 1996, 2009   x 2;last time was about 8-75yrs ago   . Obesity (BMI 30.0-34.9)   . OSA (obstructive sleep apnea)   . Osteoarthritis of both knees   . Peripheral edema    Venous Stasis  . Persistent atrial fibrillation (HCC)    Unable to maintaine NSR after DCCV with Tikosyn.  Plan = rate control w/diltiazem (beta blocker off b/c fatigue). CHA2DS2Vasc 4. DOAC - Eliquis.   . Prostate cancer ([place]) 2005   seed implant    Past Surgical History:  Procedure Laterality Date  . CARDIOVERSION N/A 06/12/2015   Procedure: CARDIOVERSION;  Surgeon: Sanda Klein, MD;  Location: [place] ENDOSCOPY;  Service: Cardiovascular;  Laterality: N/A;  . CARDIOVERSION N/A 08/02/2015   Procedure: CARDIOVERSION;  Surgeon: [name], MD;  Location: [place];  Service: Cardiovascular;  Laterality: N/A;  . CHOLECYSTECTOMY    . COLONOSCOPY    . CORONARY ANGIOPLASTY WITH STENT PLACEMENT  1996, 2009, August 2011   PTCA of L Cx 1996; BMS-RCA [facility]) 2009; Staged PCI RCA (70% ISR) --> 3.0 mm x 23 mm BMS, staged PCI Diag - 2.0 mm x 12 mm Mini Vision BMS (2.25 mm)  . cyst removed  in college  . NEUROPLASTY / TRANSPOSITION MEDIAN NERVE AT CARPAL TUNNEL BILATERAL    . NM  MYOVIEW LTD  November 2013   EF 53%; fixed mid inferior-inferolateral defect, infarct with no ischemia.  . right knee arthrsoscopy  20yrs ago  . right trigger finger    . seeds placed into prostate   2005  . TEE WITHOUT CARDIOVERSION N/A 06/12/2015   Procedure: TRANSESOPHAGEAL ECHOCARDIOGRAM (TEE) - WITH CARDIOVERSION;  Surgeon: Sanda Klein, MD;  Location: [place];  Service: Cardiovascular: EF 55-60%.No LA or LAA thrombus. Normal Peal PAP ~33 mmHg.  [name] TOTAL KNEE ARTHROPLASTY  05/09/2012   Procedure: TOTAL KNEE ARTHROPLASTY;  Surgeon: Kerin Salen, MD;  Location: [place];  Service: Orthopedics;  Laterality: Right;  . TRANSTHORACIC ECHOCARDIOGRAM  06/2015   EF  60-65%. Mild LA dilation. Normal PA pressures: 32 mmHg. Aortic sclerosis but no stenosis.  [name] wisdom teeth extraccted      Allergies  Allergen Reactions  . Crab [Shellfish Allergy] Rash  . Hydrocodone Hives  . Adhesive [Tape] Rash  . Oxycodone Rash    Outpatient Encounter Medications as of 07/18/2019  Medication Sig  . acetaminophen (TYLENOL) 500 MG tablet Take 1,000 mg by mouth. Three times a day as needed  . ALPRAZolam (XANAX) 0.25 MG tablet Take 1 tablet (0.25 mg total) by mouth 2 (two) times daily as needed for anxiety.  [name] atorvastatin (LIPITOR) 40 MG tablet TAKE 1 TABLET DAILY AT BEDTIME  . barrier cream (NON-SPECIFIED) CREA Apply 1 application topically. Zinc Oxide to buttocks area when assisting resident with toileting for redness every shift  . Cholecalciferol (VITAMIN D3) 1000 units CAPS Take 1 capsule by mouth daily.   [name] diltiazem (DILACOR XR) 240 MG 24 hr capsule Take 240 mg by mouth daily. For heart failure  . ELIQUIS 5 MG TABS tablet TAKE 1 TABLET TWICE A DAY (SCHEDULE FOLLOW UP WITH CARDIOLOGIST PRIOR TO NEXT REFILL REQUEST)  . esomeprazole (NEXIUM) 40 MG capsule TAKE 1 CAPSULE DAILY  . ferrous sulfate 325 (65 FE) MG tablet Take 325 mg by mouth daily with breakfast.  . furosemide (LASIX) 40 MG tablet Take 40 mg by mouth daily.  [name] losartan (COZAAR) 100 MG tablet Take 50 mg by mouth daily.  . magnesium oxide (MAG-OX) 400 MG tablet Take 200 mg by mouth daily.  . mirabegron ER (MYRBETRIQ) 25 MG TB24 tablet Take 25 mg by mouth daily.   [name] NITROSTAT 0.4 MG SL tablet USE AS NEEDED  . OXYGEN Inhale 2 L into the lungs continuous. For obstructive sleep apnea  . Polyethylene Glycol 3350 (MIRALAX PO) Take 17 g by mouth daily.   . tamsulosin (FLOMAX) 0.4 MG CAPS capsule Take 0.4 mg by mouth daily.   . [DISCONTINUED] traMADol (ULTRAM) 50 MG tablet Take 1 tablet (50 mg total) by mouth 2 (two) times daily for 15 days.   No facility-administered encounter medications on file as of  07/18/2019.    Review of Systems:  Review of Systems  Constitutional: Positive for activity change.  HENT: Negative.   Respiratory: Negative.   Cardiovascular: Positive for leg swelling.  Gastrointestinal: Negative.   Genitourinary: Negative.   Musculoskeletal: Positive for back pain and gait problem.  Neurological: Positive for weakness.  Psychiatric/Behavioral: Negative.     Health Maintenance  Topic Date Due  . TETANUS/TDAP  12/12/2022  . INFLUENZA VACCINE  Completed  . PNA vac Low Risk Adult  Completed    Physical Exam: Vitals:   07/18/19 1611  BP: (!) 146/74  Pulse: 89  Resp: 18  Temp: (!) 97.5 F (36.4 C)  TempSrc: Oral  SpO2: 99%  Weight: 213 lb (96.6 kg)  Height: 5\' 7"  (1.702 m)   Body mass index is 33.36 kg/m. Physical Exam Constitutional:      Appearance: He is obese.  HENT:     Head: Normocephalic.     Nose: Nose normal.     Mouth/Throat:     Mouth: Mucous membranes are moist.     Pharynx: Oropharynx is clear.  Eyes:     Pupils: Pupils are equal, round, and reactive to light.  Cardiovascular:     Rate and Rhythm: Normal rate.     Pulses: Normal pulses.     Heart sounds: Normal heart sounds.  Pulmonary:     Effort: Pulmonary effort is normal. No respiratory distress.     Breath sounds: Normal breath sounds. No wheezing.  Abdominal:     General: Abdomen is flat. Bowel sounds are normal.     Palpations: Abdomen is soft.  Musculoskeletal:        General: Swelling present.     Cervical back: Neck supple.     Comments: Tender in Low Back Area  Skin:    General: Skin is warm.  Neurological:     General: No focal deficit present.     Mental Status: He is alert.  Psychiatric:        Mood and Affect: Mood normal.     Labs reviewed: Basic Metabolic Panel: Recent Labs    07/19/18 1505 07/19/18 1505 01/30/19 1700 01/30/19 1700 03/25/19 0000 03/26/19 0000 04/30/19 0000  NA 139   < > 138  --  141 142 137  K 4.3   < > 3.8   < > 4.1 4.0  4.2  CL 106   < > 101   < > 104 103 99  CO2 29   < > 26   < > 28* 28* 29*  GLUCOSE 112*  --  143*  --   --   --   --   BUN 14   < > 14  --  16 17 20   CREATININE 1.04   < > 0.95  --  0.8 0.8 0.9  CALCIUM 8.7*   < > 9.0   < > 8.5* 8.4* 8.5*  MG 1.9  --   --   --   --   --   --    < > = values in this interval not displayed.   Liver Function Tests: Recent Labs    01/30/19 1700 03/26/19 0000 04/30/19 0000  AST 14* 8* 9*  ALT 9 4* 4*  ALKPHOS 77 69 77  BILITOT 0.4  --   --   PROT 6.9  --   --   ALBUMIN 3.2* 3.5  --    No results for input(s): LIPASE, AMYLASE in the last 8760 hours. No results for input(s): AMMONIA in the last 8760 hours. CBC: Recent Labs    01/30/19 1700 01/30/19 1700 03/26/19 0000 04/03/19 0000 04/30/19 0000  WBC 8.2  --  8.4 9.4 9.1  NEUTROABS 6.2  --   --  7,417 6,224  HGB 9.9*   < > 9.4* 9.6* 10.7*  HCT 33.6*   < > 30* 31* 34*  MCV 83.8  --   --   --   --   PLT 309   < > 357 327 285   < > = values in this interval not displayed.   Lipid Panel: Recent Labs    04/30/19 0000  CHOL 108  HDL 41  LDLCALC 50  TRIG 88   No results found for: HGBA1C  Procedures since last visit: No results found.  Assessment/Plan Chronic bilateral low back pain without sciatica Has h/o Spinal Stenosis. With no recent falls. Will try Pain control for now Will try Tramdol 50 mg BID for 14 days If no improvement then Consider Imaging  ILD (interstitial lung disease) ([place]) On Chronic Oxygen Follows with Pulmonology Obesity (BMI 30.0-34.9) Does not walk any more Dependent for his ADLS. Stays in his Recliner most of the day  Persistent atrial fibrillation [facility]):  CHA2DS2-VASc Score 4; On Eliquis On Cardizem and Eliquis   Other issues Hypertension ON [place] and Cardizem Diastolic congestive heart failure, unspecified HF chronicity (HCC) Continue on Lasix  Bun and Creat stable Physical deconditioning Does not work with Therapy anymore Mostly Wheelchair  dependent  Hyperlipidemia with target LDL less than 70 LDL 50 on Lipitor Iron deficiency anemia On Iron  Hgb Stable Iron Sats were 8 CAD S/P percutaneous coronary angioplasty Follows with Cardiology On Statin OSA Refuses to use CPAP Urinary Incontinence ON Flomax and Myrbetriq Anxiety Will continue low doe of Xanax  Not candidate for GDR   Labs/tests ordered:  * No order type specified * Next appt:  Visit date not found

## 2019-07-22 DIAGNOSIS — Z20828 Contact with and (suspected) exposure to other viral communicable diseases: Secondary | ICD-10-CM | POA: Diagnosis not present

## 2019-08-14 DIAGNOSIS — Z20828 Contact with and (suspected) exposure to other viral communicable diseases: Secondary | ICD-10-CM | POA: Diagnosis not present

## 2019-08-21 DIAGNOSIS — Z20828 Contact with and (suspected) exposure to other viral communicable diseases: Secondary | ICD-10-CM | POA: Diagnosis not present

## 2019-08-22 ENCOUNTER — Encounter: Payer: Self-pay | Admitting: Nurse Practitioner

## 2019-08-22 ENCOUNTER — Non-Acute Institutional Stay (SKILLED_NURSING_FACILITY): Payer: Medicare Other | Admitting: Nurse Practitioner

## 2019-08-22 DIAGNOSIS — L740 Miliaria rubra: Secondary | ICD-10-CM | POA: Diagnosis not present

## 2019-08-22 DIAGNOSIS — K5900 Constipation, unspecified: Secondary | ICD-10-CM | POA: Diagnosis not present

## 2019-08-22 DIAGNOSIS — K623 Rectal prolapse: Secondary | ICD-10-CM | POA: Diagnosis not present

## 2019-08-22 DIAGNOSIS — I4819 Other persistent atrial fibrillation: Secondary | ICD-10-CM | POA: Diagnosis not present

## 2019-08-22 DIAGNOSIS — R32 Unspecified urinary incontinence: Secondary | ICD-10-CM | POA: Diagnosis not present

## 2019-08-22 DIAGNOSIS — R159 Full incontinence of feces: Secondary | ICD-10-CM | POA: Insufficient documentation

## 2019-08-22 NOTE — Assessment & Plan Note (Signed)
Heart rate is in control, continue Diltiazem, Eliquis.  °

## 2019-08-22 NOTE — Assessment & Plan Note (Signed)
Manage with adult brief.

## 2019-08-22 NOTE — Progress Notes (Signed)
Location:   [place] Room Number: 5 Place of Service:  SNF (31) Provider: Lennie Odor Arnola Crittendon NP  Virgie Dad, MD  Patient Care Team: Virgie Dad, MD as PCP - General (Internal Medicine) Leonie Krystopher Kuenzel, MD as PCP - Cardiology (Cardiology)  Extended Emergency Contact Information Primary Emergency Contact: [facility] Address: [address]          [place], [place] [address] [name] of [place] Phone: [phone] Mobile Phone: [phone] Relation: Spouse Secondary Emergency Contact: Janey Genta Address: [address]          [place], [place] Phone: [phone] Mobile Phone: [phone] Relation: Daughter  Code Status: DNR Goals of care: Advanced Directive information Advanced Directives 08/22/2019  Does Patient Have a Medical Advance Directive? Yes  Type of Advance Directive Living will  Does patient want to make changes to medical advance directive? No - Patient declined  Copy of [place] in Chart? -  Would patient like information on creating a medical advance directive? -  Pre-existing out of facility DNR order (yellow form or pink MOST form) -     Chief Complaint  Patient presents with  . Acute Visit    Skin rash    HPI:  Pt is a 86 y.o. male seen today for an acute visit for rash in back, buttock, groins, abd skin folds. The patient sleeps and eats all meals in recliner 24 hours daily on buttocks and often refuses care, uses adult brief for incontinent of B+B. Noted prolapsed rectum upon my examination today, no pain or bleeding.   Hx of Afib, heart rate is in control, on Diltiazem 240mg  qd, Eliquis 5mg  bid. No constipation on MiraLax, urinary incontinence on Tamsulosin, Myrbetriq.    Past Medical History:  Diagnosis Date  . Anxiety   . CAD S/P percutaneous coronary angioplasty 1996, 2009, 01/2010   PTCA of L Cx 1996; BMS-RCA [facility]) 2009; Staged PCI RCA (70% ISR)  --> 3.0 mm x 23 mm BMS, staged PCI Diag - 2.0 mm x 12 mm Mini Vision BMS (2.25 mm)  . Cataracts, bilateral    immature  . Chronic back pain    scoliosis--pt states unable to have surgery bc of age  . GERD (gastroesophageal reflux disease)    takes Nexium daily  . History of colon polyps   . Hyperlipidemia   . Hypertension   . Myocardial infarction [facility]) 1996, 2009   x 2;last time was about 8-68yrs ago   . Obesity (BMI 30.0-34.9)   . OSA (obstructive sleep apnea)   . Osteoarthritis of both knees   . Peripheral edema    Venous Stasis  . Persistent atrial fibrillation (HCC)    Unable to maintaine NSR after DCCV with Tikosyn.  Plan = rate control w/diltiazem (beta blocker off b/c fatigue). CHA2DS2Vasc 4. DOAC - Eliquis.   . Prostate cancer ([place]) 2005   seed implant   Past Surgical History:  Procedure Laterality Date  . CARDIOVERSION N/A 06/12/2015   Procedure: CARDIOVERSION;  Surgeon: Sanda Klein, MD;  Location: [facility];  Service: Cardiovascular;  Laterality: N/A;  . CARDIOVERSION N/A 08/02/2015   Procedure: CARDIOVERSION;  Surgeon: [name], MD;  Location: [place];  Service: Cardiovascular;  Laterality: N/A;  . CHOLECYSTECTOMY    . COLONOSCOPY    . CORONARY ANGIOPLASTY WITH STENT PLACEMENT  1996, 2009, August 2011   PTCA of L Cx 1996; BMS-RCA [facility]) 2009; Staged PCI RCA (70%  ISR) --> 3.0 mm x 23 mm BMS, staged PCI Diag - 2.0 mm x 12 mm Mini Vision BMS (2.25 mm)  . cyst removed  in college  . NEUROPLASTY / TRANSPOSITION MEDIAN NERVE AT CARPAL TUNNEL BILATERAL    . NM MYOVIEW LTD  November 2013   EF 53%; fixed mid inferior-inferolateral defect, infarct with no ischemia.  . right knee arthrsoscopy  43yrs ago  . right trigger finger    . seeds placed into prostate   2005  . TEE WITHOUT CARDIOVERSION N/A 06/12/2015   Procedure: TRANSESOPHAGEAL ECHOCARDIOGRAM (TEE) - WITH CARDIOVERSION;  Surgeon: Sanda Klein, MD;  Location: [place];  Service: Cardiovascular: EF 55-60%.No  LA or LAA thrombus. Normal Peal PAP ~33 mmHg.  [name] TOTAL KNEE ARTHROPLASTY  05/09/2012   Procedure: TOTAL KNEE ARTHROPLASTY;  Surgeon: Kerin Salen, MD;  Location: [facility];  Service: Orthopedics;  Laterality: Right;  . TRANSTHORACIC ECHOCARDIOGRAM  06/2015   EF 60-65%. Mild LA dilation. Normal PA pressures: 32 mmHg. Aortic sclerosis but no stenosis.  [name] wisdom teeth extraccted      Allergies  Allergen Reactions  . Crab [Shellfish Allergy] Rash  . Hydrocodone Hives  . Adhesive [Tape] Rash  . Oxycodone Rash    Allergies as of 08/22/2019      Reactions   Crab [shellfish Allergy] Rash   Hydrocodone Hives   Adhesive [tape] Rash   Oxycodone Rash      Medication List       Accurate as of August 22, 2019 11:59 PM. If you have any questions, ask your nurse or doctor.        acetaminophen 500 MG tablet Commonly known as: TYLENOL Take 1,000 mg by mouth. Three times a day as needed   ALPRAZolam 0.25 MG tablet Commonly known as: XANAX Take 1 tablet (0.25 mg total) by mouth 2 (two) times daily as needed for anxiety.   atorvastatin 40 MG tablet Commonly known as: LIPITOR TAKE 1 TABLET DAILY AT BEDTIME   barrier cream Crea Commonly known as: non-specified Apply 1 application topically. Zinc Oxide to buttocks area when assisting resident with toileting for redness every shift   diltiazem 240 MG 24 hr capsule Commonly known as: DILACOR XR Take 240 mg by mouth daily. For heart failure   Eliquis 5 MG Tabs tablet Generic drug: apixaban TAKE 1 TABLET TWICE A DAY (SCHEDULE FOLLOW UP WITH CARDIOLOGIST PRIOR TO NEXT REFILL REQUEST)   esomeprazole 40 MG capsule Commonly known as: NEXIUM TAKE 1 CAPSULE DAILY   ferrous sulfate 325 (65 FE) MG tablet Take 325 mg by mouth daily with breakfast.   furosemide 40 MG tablet Commonly known as: LASIX Take 40 mg by mouth daily.   losartan 100 MG tablet Commonly known as: COZAAR Take 50 mg by mouth daily.   magnesium oxide 400 MG  tablet Commonly known as: MAG-OX Take 200 mg by mouth daily.   MIRALAX PO Take 17 g by mouth daily.   Myrbetriq 25 MG Tb24 tablet Generic drug: mirabegron ER Take 25 mg by mouth daily.   Nitrostat 0.4 MG SL tablet Generic drug: nitroGLYCERIN USE AS NEEDED   OXYGEN Inhale 2 L into the lungs continuous. For obstructive sleep apnea   tamsulosin 0.4 MG Caps capsule Commonly known as: FLOMAX Take 0.4 mg by mouth daily.   Vitamin D3 25 MCG (1000 UT) Caps Take 1 capsule by mouth daily.       Review of Systems  Constitutional: Negative for activity change, appetite  change, fatigue and fever.  HENT: Positive for hearing loss. Negative for congestion and voice change.   Eyes: Negative for visual disturbance.  Respiratory: Positive for shortness of breath. Negative for cough and wheezing.        O2 dependent.   Cardiovascular: Positive for leg swelling.  Gastrointestinal: Negative for abdominal distention, constipation, diarrhea, nausea and vomiting.       Incontinent of feces.   Genitourinary: Negative for difficulty urinating, dysuria and urgency.       Incontinent of urine.   Musculoskeletal: Positive for arthralgias and gait problem.  Skin: Positive for rash. Negative for color change and pallor.       Back, buttocks, groin, abd skin folds.   Neurological: Negative for speech difficulty, weakness and headaches.       Memory lapses.   Psychiatric/Behavioral: Negative for agitation, behavioral problems and sleep disturbance. The patient is not nervous/anxious.     Immunization History  Administered Date(s) Administered  . H1N1 04/13/2010  . Influenza, High Dose Seasonal PF 02/12/2014, 03/29/2018, 01/05/2019  . Influenza-Unspecified 03/06/2013, 02/12/2014, 03/06/2017  . Moderna SARS-COVID-2 Vaccination 06/08/2019, 07/06/2019  . Pneumococcal Conjugate-13 02/12/2014  . Pneumococcal Polysaccharide-23 02/04/2013, 02/12/2014  . Td 09/04/2005  . Tdap 12/11/2012  . Zoster  07/07/2006   Pertinent  Health Maintenance Due  Topic Date Due  . INFLUENZA VACCINE  Completed  . PNA vac Low Risk Adult  Completed   No flowsheet data found. Functional Status Survey:    Vitals:   08/22/19 1318  BP: 132/64  Pulse: 79  Resp: 18  Temp: 98.1 F (36.7 C)  SpO2: 96%  Weight: 217 lb (98.4 kg)  Height: 5\' 7"  (1.702 m)   Body mass index is 33.99 kg/m. Physical Exam Vitals and nursing note reviewed.  Constitutional:      General: He is not in acute distress.    Appearance: Normal appearance. He is obese. He is not ill-appearing.  HENT:     Head: Normocephalic and atraumatic.     Mouth/Throat:     Mouth: Mucous membranes are moist.  Eyes:     Extraocular Movements: Extraocular movements intact.     Conjunctiva/sclera: Conjunctivae normal.     Pupils: Pupils are equal, round, and reactive to light.  Cardiovascular:     Rate and Rhythm: Normal rate. Rhythm irregular.     Heart sounds: No murmur.  Pulmonary:     Breath sounds: No wheezing.     Comments: O2 dependent.  Abdominal:     General: Bowel sounds are normal. There is no distension.     Palpations: Abdomen is soft.     Tenderness: There is no abdominal tenderness. There is no guarding.  Genitourinary:    Comments: Relapsed rectal mucosa Musculoskeletal:     Cervical back: Normal range of motion and neck supple.     Right lower leg: Edema present.     Left lower leg: Edema present.     Comments: Trace edema BLE. Mechanical lift for transfer.   Skin:    General: Skin is warm and dry.     Findings: Erythema and rash present.     Comments: Venous insufficiency skin changes BLE. Raised satellite pattern of red papules on back, redness in groin/abd skin folds, dark brownish buttocks.   Neurological:     General: No focal deficit present.     Mental Status: He is alert and oriented to person, place, and time. Mental status is at baseline.     Motor:  No weakness.     Coordination: Coordination normal.      Gait: Gait abnormal.  Psychiatric:        Mood and Affect: Mood normal.        Behavior: Behavior normal.        Thought Content: Thought content normal.        Judgment: Judgment normal.     Comments: Sad facial looks.      Labs reviewed: Recent Labs    01/30/19 1700 01/30/19 1700 03/25/19 0000 03/26/19 0000 04/30/19 0000  NA 138  --  141 142 137  K 3.8   < > 4.1 4.0 4.2  CL 101   < > 104 103 99  CO2 26   < > 28* 28* 29*  GLUCOSE 143*  --   --   --   --   BUN 14  --  16 17 20   CREATININE 0.95  --  0.8 0.8 0.9  CALCIUM 9.0   < > 8.5* 8.4* 8.5*   < > = values in this interval not displayed.   Recent Labs    01/30/19 1700 03/26/19 0000 04/30/19 0000  AST 14* 8* 9*  ALT 9 4* 4*  ALKPHOS 77 69 77  BILITOT 0.4  --   --   PROT 6.9  --   --   ALBUMIN 3.2* 3.5  --    Recent Labs    01/30/19 1700 01/30/19 1700 03/26/19 0000 04/03/19 0000 04/30/19 0000  WBC 8.2  --  8.4 9.4 9.1  NEUTROABS 6.2  --   --  7,417 6,224  HGB 9.9*   < > 9.4* 9.6* 10.7*  HCT 33.6*   < > 30* 31* 34*  MCV 83.8  --   --   --   --   PLT 309   < > 357 327 285   < > = values in this interval not displayed.   Lab Results  Component Value Date   TSH 2.00 02/24/2016   No results found for: HGBA1C Lab Results  Component Value Date   CHOL 108 04/30/2019   HDL 41 04/30/2019   LDLCALC 50 04/30/2019   TRIG 88 04/30/2019   CHOLHDL 2.9 02/12/2014    Significant Diagnostic Results in last 30 days:  No results found.  Assessment/Plan: Heat rash Denied itching, will assist the patient change position and personal hygiene frequently to reduce heat/moist to the ares, will apply Nystatin powder to back, buttocks, groins, abd skin folds bid until healed.   Rectal mucosa prolapse No pain or bleeding, gentle pressure apply to reduce prolapsed rectal mucosa, avoid constipation.   Constipation Stable, continue MiraLax.   Urinary incontinence Continue Tamsulosin, Myrbetriq  Fecal  incontinence Manage with adult brief.   Persistent atrial fibrillation ([place]):  CHA2DS2-VASc Score 4; On Eliquis Heart rate is in control, continue Diltiazem, Eliquis.     Family/ staff Communication: plan of care reviewed with the patient and charge nurse.   Labs/tests ordered:  None  Time spend 25 minutes.

## 2019-08-22 NOTE — Assessment & Plan Note (Signed)
Denied itching, will assist the patient change position and personal hygiene frequently to reduce heat/moist to the ares, will apply Nystatin powder to back, buttocks, groins, abd skin folds bid until healed.

## 2019-08-22 NOTE — Assessment & Plan Note (Signed)
Stable, continue MiraLax.  °

## 2019-08-22 NOTE — Assessment & Plan Note (Signed)
No pain or bleeding, gentle pressure apply to reduce prolapsed rectal mucosa, avoid constipation.

## 2019-08-22 NOTE — Assessment & Plan Note (Signed)
Continue Tamsulosin, Myrbetriq

## 2019-08-23 ENCOUNTER — Encounter: Payer: Self-pay | Admitting: Nurse Practitioner

## 2019-08-27 DIAGNOSIS — E870 Hyperosmolality and hypernatremia: Secondary | ICD-10-CM | POA: Diagnosis not present

## 2019-08-27 DIAGNOSIS — E785 Hyperlipidemia, unspecified: Secondary | ICD-10-CM | POA: Diagnosis not present

## 2019-08-28 ENCOUNTER — Encounter: Payer: Self-pay | Admitting: Nurse Practitioner

## 2019-08-28 ENCOUNTER — Non-Acute Institutional Stay (SKILLED_NURSING_FACILITY): Payer: Medicare Other | Admitting: Nurse Practitioner

## 2019-08-28 DIAGNOSIS — D508 Other iron deficiency anemias: Secondary | ICD-10-CM | POA: Diagnosis not present

## 2019-08-28 DIAGNOSIS — J849 Interstitial pulmonary disease, unspecified: Secondary | ICD-10-CM

## 2019-08-28 DIAGNOSIS — R32 Unspecified urinary incontinence: Secondary | ICD-10-CM

## 2019-08-28 DIAGNOSIS — K219 Gastro-esophageal reflux disease without esophagitis: Secondary | ICD-10-CM

## 2019-08-28 DIAGNOSIS — F419 Anxiety disorder, unspecified: Secondary | ICD-10-CM | POA: Diagnosis not present

## 2019-08-28 DIAGNOSIS — K5900 Constipation, unspecified: Secondary | ICD-10-CM

## 2019-08-28 DIAGNOSIS — I5030 Unspecified diastolic (congestive) heart failure: Secondary | ICD-10-CM

## 2019-08-28 DIAGNOSIS — I4819 Other persistent atrial fibrillation: Secondary | ICD-10-CM | POA: Diagnosis not present

## 2019-08-28 DIAGNOSIS — M1711 Unilateral primary osteoarthritis, right knee: Secondary | ICD-10-CM

## 2019-08-28 LAB — MAGNESIUM: Magnesium: 1.5

## 2019-08-28 NOTE — Assessment & Plan Note (Signed)
Compensated, O2 prn, minimal edema BLE, continue Furosemide.

## 2019-08-28 NOTE — Assessment & Plan Note (Signed)
Stable, continue MiraLax qd.  

## 2019-08-28 NOTE — Progress Notes (Addendum)
Location:   [facility] Room Number: 5 Place of Service:  SNF (31) Provider:  ,  NP  Virgie Dad, MD  Patient Care Team: Virgie Dad, MD as PCP - General (Internal Medicine) Leonie , MD as PCP - Cardiology (Cardiology)  Extended Emergency Contact Information Primary Emergency Contact: [facility] Address: [address]          [place], [place] [address] [name] of [place] Phone: [phone] Mobile Phone: [phone] Relation: Spouse Secondary Emergency Contact: Janey Genta Address: [address]          [place], [place] Phone: [phone] Mobile Phone: [phone] Relation: Daughter  Code Status:  Full Code Goals of care: Advanced Directive information Advanced Directives 08/28/2019  Does Patient Have a Medical Advance Directive? Yes  Type of Advance Directive Living will  Does patient want to make changes to medical advance directive? No - Patient declined  Copy of [place] in Chart? -  Would patient like information on creating a medical advance directive? -  Pre-existing out of facility DNR order (yellow form or pink MOST form) -     Chief Complaint  Patient presents with  . Medical agement of Chronic Issues    HPI:  Pt is a 86 y.o. male seen today for medical management of chronic diseases.    The patient resides in [facility] for safety, care assistance, sleeps in his recliner, ambulates with walker in his room, his mood is managed on Alprazolam 0.25mg  bid prn. Hx of Afib, heart rate is in control, on diltiazem 240mg  qd, Eliqis 5mg  bid. OA, in knees, right great toe,  on Tylenol 1000mg  tid prn. GERD, stable, on Esomeprazole 40mg  qd. Anemia, stable, on Fe. CHF\BLE edema, minimal, on Furosemide 40mg  qd. HTN, blood pressure is controlled on Losartan 50mg  qd. Urinary frequency/incontinence, stable, on Mirabegraon 25mg  qd, Tamsulosin 0.4mg  qd.  Constipation, stable, on MiraLax qd.    Past Medical History:  Diagnosis Date  . Anxiety   . CAD S/P percutaneous coronary angioplasty 1996, 2009, 01/2010   PTCA of L Cx 1996; BMS-RCA [facility]) 2009; Staged PCI RCA (70% ISR) --> 3.0 mm x 23 mm BMS, staged PCI Diag - 2.0 mm x 12 mm Mini Vision BMS (2.25 mm)  . Cataracts, bilateral    immature  . Chronic back pain    scoliosis--pt states unable to have surgery bc of age  . GERD (gastroesophageal reflux disease)    takes Nexium daily  . History of colon polyps   . Hyperlipidemia   . Hypertension   . Myocardial infarction [facility]) 1996, 2009   x 2;last time was about 8-60yrs ago   . Obesity (BMI 30.0-34.9)   . OSA (obstructive sleep apnea)   . Osteoarthritis of both knees   . Peripheral edema    Venous Stasis  . Persistent atrial fibrillation (HCC)    Unable to maintaine NSR after DCCV with Tikosyn.  Plan = rate control w/diltiazem (beta blocker off b/c fatigue). CHA2DS2Vasc 4. DOAC - Eliquis.   . Prostate cancer ([place]) 2005   seed implant   Past Surgical History:  Procedure Laterality Date  . CARDIOVERSION N/A 06/12/2015   Procedure: CARDIOVERSION;  Surgeon: Sanda Klein, MD;  Location: [place] ENDOSCOPY;  Service: Cardiovascular;  Laterality: N/A;  . CARDIOVERSION N/A 08/02/2015   Procedure: CARDIOVERSION;  Surgeon: [name], MD;  Location: [place];  Service: Cardiovascular;  Laterality: N/A;  . CHOLECYSTECTOMY    .  COLONOSCOPY    . CORONARY ANGIOPLASTY WITH STENT PLACEMENT  1996, 2009, August 2011   PTCA of L Cx 1996; BMS-RCA [facility]) 2009; Staged PCI RCA (70% ISR) --> 3.0 mm x 23 mm BMS, staged PCI Diag - 2.0 mm x 12 mm Mini Vision BMS (2.25 mm)  . cyst removed  in college  . NEUROPLASTY / TRANSPOSITION MEDIAN NERVE AT CARPAL TUNNEL BILATERAL    . NM MYOVIEW LTD  November 2013   EF 53%; fixed mid inferior-inferolateral defect, infarct with no ischemia.  . right knee arthrsoscopy  41yrs ago  . right trigger finger    . seeds placed  into prostate   2005  . TEE WITHOUT CARDIOVERSION N/A 06/12/2015   Procedure: TRANSESOPHAGEAL ECHOCARDIOGRAM (TEE) - WITH CARDIOVERSION;  Surgeon: Sanda Klein, MD;  Location: [place];  Service: Cardiovascular: EF 55-60%.No LA or LAA thrombus. Normal Peal PAP ~33 mmHg.  [name] TOTAL KNEE ARTHROPLASTY  05/09/2012   Procedure: TOTAL KNEE ARTHROPLASTY;  Surgeon: Kerin Salen, MD;  Location: [place];  Service: Orthopedics;  Laterality: Right;  . TRANSTHORACIC ECHOCARDIOGRAM  06/2015   EF 60-65%. Mild LA dilation. Normal PA pressures: 32 mmHg. Aortic sclerosis but no stenosis.  [name] wisdom teeth extraccted      Allergies  Allergen Reactions  . Crab [Shellfish Allergy] Rash  . Hydrocodone Hives  . Adhesive [Tape] Rash  . Oxycodone Rash    Allergies as of 08/28/2019      Reactions   Crab [shellfish Allergy] Rash   Hydrocodone Hives   Adhesive [tape] Rash   Oxycodone Rash      Medication List       Accurate as of August 28, 2019  2:30 PM. If you have any questions, ask your nurse or doctor.        acetaminophen 500 MG tablet Commonly known as: TYLENOL Take 1,000 mg by mouth. Three times a day as needed   ALPRAZolam 0.25 MG tablet Commonly known as: XANAX Take 1 tablet (0.25 mg total) by mouth 2 (two) times daily as needed for anxiety.   atorvastatin 40 MG tablet Commonly known as: LIPITOR TAKE 1 TABLET DAILY AT BEDTIME   barrier cream Crea Commonly known as: non-specified Apply 1 application topically. Zinc Oxide to buttocks area when assisting resident with toileting for redness every shift   diltiazem 240 MG 24 hr capsule Commonly known as: DILACOR XR Take 240 mg by mouth daily. For heart failure   Eliquis 5 MG Tabs tablet Generic drug: apixaban TAKE 1 TABLET TWICE A DAY (SCHEDULE FOLLOW UP WITH CARDIOLOGIST PRIOR TO NEXT REFILL REQUEST)   esomeprazole 40 MG capsule Commonly known as: NEXIUM TAKE 1 CAPSULE DAILY   ferrous sulfate 325 (65 FE) MG tablet Take 325 mg by  mouth daily with breakfast.   furosemide 40 MG tablet Commonly known as: LASIX Take 40 mg by mouth daily.   losartan 100 MG tablet Commonly known as: COZAAR Take 50 mg by mouth daily.   magnesium oxide 400 MG tablet Commonly known as: MAG-OX Take 200 mg by mouth daily.   MIRALAX PO Take 17 g by mouth daily.   Myrbetriq 25 MG Tb24 tablet Generic drug: mirabegron ER Take 25 mg by mouth daily.   Nitrostat 0.4 MG SL tablet Generic drug: nitroGLYCERIN USE AS NEEDED   nystatin powder Commonly known as: MYCOSTATIN/NYSTOP Apply 1 application topically. Apply to back, R/L buttocks, groin, and under breasts Twice A Day   OXYGEN Inhale 2 L into the  lungs continuous. For obstructive sleep apnea   tamsulosin 0.4 MG Caps capsule Commonly known as: FLOMAX Take 0.4 mg by mouth daily.   Vitamin D3 25 MCG (1000 UT) Caps Take 1 capsule by mouth daily.       Review of Systems  Constitutional: Negative for activity change, appetite change, fatigue, fever and unexpected weight change.  HENT: Positive for hearing loss. Negative for congestion and voice change.   Eyes: Negative for visual disturbance.  Respiratory: Positive for shortness of breath. Negative for cough and wheezing.        O2 dependent.   Cardiovascular: Positive for leg swelling.  Gastrointestinal: Negative for abdominal distention and constipation.       Incontinent of feces.   Genitourinary: Negative for difficulty urinating, dysuria and urgency.       Incontinent of urine.   Musculoskeletal: Positive for arthralgias and gait problem.       Left knee, right great pain, but no warmth, redness, or swelling.   Skin: Negative for color change, pallor and rash.       Back, buttocks, groin, abd skin folds are resolving.   Neurological: Negative for speech difficulty, weakness and headaches.       Memory lapses.   Psychiatric/Behavioral: Negative for agitation, behavioral problems and sleep disturbance. The patient is  not nervous/anxious.     Immunization History  Administered Date(s) Administered  . H1N1 04/13/2010  . Influenza, High Dose Seasonal PF 02/12/2014, 03/29/2018, 01/05/2019  . Influenza-Unspecified 03/06/2013, 02/12/2014, 03/06/2017  . Moderna SARS-COVID-2 Vaccination 06/08/2019, 07/06/2019  . Pneumococcal Conjugate-13 02/12/2014  . Pneumococcal Polysaccharide-23 02/04/2013, 02/12/2014  . Td 09/04/2005  . Tdap 12/11/2012  . Zoster 07/07/2006   Pertinent  Health Maintenance Due  Topic Date Due  . INFLUENZA VACCINE  Completed  . PNA vac Low Risk Adult  Completed   No flowsheet data found. Functional Status Survey:    Vitals:   08/28/19 0844  BP: 132/70  Pulse: 76  Resp: 18  Temp: 98.3 F (36.8 C)  SpO2: 95%  Weight: 217 lb (98.4 kg)  Height: 5\' 7"  (1.702 m)   Body mass index is 33.99 kg/m. Physical Exam Vitals and nursing note reviewed.  Constitutional:      General: He is not in acute distress.    Appearance: Normal appearance. He is obese. He is not ill-appearing, toxic-appearing or diaphoretic.  HENT:     Head: Normocephalic and atraumatic.     Mouth/Throat:     Mouth: Mucous membranes are moist.  Eyes:     Extraocular Movements: Extraocular movements intact.     Conjunctiva/sclera: Conjunctivae normal.     Pupils: Pupils are equal, round, and reactive to light.  Cardiovascular:     Rate and Rhythm: Normal rate. Rhythm irregular.     Heart sounds: No murmur.     Comments: DP pulses not felt, BLE are sensitive to touch, no open wound Pulmonary:     Breath sounds: No wheezing.     Comments: O2 dependent.  Abdominal:     General: Bowel sounds are normal. There is no distension.     Palpations: Abdomen is soft.     Tenderness: There is no abdominal tenderness.  Musculoskeletal:     Cervical back: Normal range of motion and neck supple.     Right lower leg: Edema present.     Left lower leg: Edema present.     Comments: Trace edema BLE. Mechanical lift for  transfer.   Skin:  General: Skin is warm and dry.     Findings: Rash present.     Comments: Venous insufficiency skin changes BLE. Raised satellite pattern of red papules on back, redness in groin/abd skin folds, dark brownish buttocks are resolving.   Neurological:     General: No focal deficit present.     Mental Status: He is alert and oriented to person, place, and time. Mental status is at baseline.     Motor: No weakness.     Coordination: Coordination normal.     Gait: Gait abnormal.  Psychiatric:        Mood and Affect: Mood normal.        Behavior: Behavior normal.        Thought Content: Thought content normal.        Judgment: Judgment normal.     Labs reviewed: Recent Labs    01/30/19 1700 01/30/19 1700 03/25/19 0000 03/26/19 0000 04/30/19 0000 08/27/19 0000  NA 138  --  141 142 137  --   K 3.8   < > 4.1 4.0 4.2  --   CL 101   < > 104 103 99  --   CO2 26   < > 28* 28* 29*  --   GLUCOSE 143*  --   --   --   --   --   BUN 14  --  16 17 20   --   CREATININE 0.95  --  0.8 0.8 0.9  --   CALCIUM 9.0   < > 8.5* 8.4* 8.5*  --   MG  --   --   --   --   --  1.5   < > = values in this interval not displayed.   Recent Labs    01/30/19 1700 03/26/19 0000 04/30/19 0000  AST 14* 8* 9*  ALT 9 4* 4*  ALKPHOS 77 69 77  BILITOT 0.4  --   --   PROT 6.9  --   --   ALBUMIN 3.2* 3.5  --    Recent Labs    01/30/19 1700 01/30/19 1700 03/26/19 0000 04/03/19 0000 04/30/19 0000  WBC 8.2  --  8.4 9.4 9.1  NEUTROABS 6.2  --   --  7,417 6,224  HGB 9.9*   < > 9.4* 9.6* 10.7*  HCT 33.6*   < > 30* 31* 34*  MCV 83.8  --   --   --   --   PLT 309   < > 357 327 285   < > = values in this interval not displayed.   Lab Results  Component Value Date   TSH 2.00 02/24/2016   No results found for: HGBA1C Lab Results  Component Value Date   CHOL 108 04/30/2019   HDL 41 04/30/2019   LDLCALC 50 04/30/2019   TRIG 88 04/30/2019   CHOLHDL 2.9 02/12/2014    Significant  Diagnostic Results in last 30 days:  No results found.  Assessment/Plan Diastolic CHF (HCC) Compensated, O2 prn, minimal edema BLE, continue Furosemide.   Persistent atrial fibrillation ([place]):  CHA2DS2-VASc Score 4; On Eliquis Heart rate is in control, continue Diltiazem, Eliquis.   ILD (interstitial lung disease) ([place]) Continue prn O2 via [place]  GERD (gastroesophageal reflux disease) Stable, continue Esomeprazole.   Osteoarthritis of right knee OA pain in the left knee, right great toe, continue Tylenol.   Anxiety Prn Alprazolam is adequate, the patient stated he sleeps and eats well.   Constipation Stable,  continue MiraLax qd.   Iron deficiency anemia Stable, continue Fe, baseline Hgb 10s  Urinary incontinence No urinary retention, continue Mirabegran, Tamsulosin.      Family/ staff Communication: plan of care reviewed with the patient and charge nurse.   Labs/tests ordered:  none  Time spend 25 minutes.

## 2019-08-28 NOTE — Assessment & Plan Note (Addendum)
OA pain in the left knee, right great toe, continue Tylenol.

## 2019-08-28 NOTE — Assessment & Plan Note (Signed)
No urinary retention, continue Mirabegran, Tamsulosin.

## 2019-08-28 NOTE — Assessment & Plan Note (Signed)
Heart rate is in control, continue Diltiazem, Eliquis.  °

## 2019-08-28 NOTE — Assessment & Plan Note (Signed)
Continue prn O2 via [place]

## 2019-08-28 NOTE — Assessment & Plan Note (Signed)
Prn Alprazolam is adequate, the patient stated he sleeps and eats well.

## 2019-08-28 NOTE — Assessment & Plan Note (Signed)
Stable, continue Fe, baseline Hgb 10s

## 2019-08-28 NOTE — Assessment & Plan Note (Signed)
Stable, continue Esomeprazole.  

## 2019-09-26 ENCOUNTER — Encounter: Payer: Self-pay | Admitting: Internal Medicine

## 2019-09-26 ENCOUNTER — Non-Acute Institutional Stay (SKILLED_NURSING_FACILITY): Payer: Medicare Other | Admitting: Internal Medicine

## 2019-09-26 DIAGNOSIS — I4819 Other persistent atrial fibrillation: Secondary | ICD-10-CM

## 2019-09-26 DIAGNOSIS — D508 Other iron deficiency anemias: Secondary | ICD-10-CM

## 2019-09-26 DIAGNOSIS — J849 Interstitial pulmonary disease, unspecified: Secondary | ICD-10-CM | POA: Diagnosis not present

## 2019-09-26 DIAGNOSIS — I5030 Unspecified diastolic (congestive) heart failure: Secondary | ICD-10-CM | POA: Diagnosis not present

## 2019-09-26 NOTE — Progress Notes (Signed)
Location:   [facility] Room Number: 5 Place of Service:  SNF [id number]) Provider:  Virgie Dad, MD  Virgie Dad, MD  Patient Care Team: Virgie Dad, MD as PCP - General (Internal Medicine) Leonie Man, MD as PCP - Cardiology (Cardiology)  Extended Emergency Contact Information Primary Emergency Contact: [facility] Address: [address]          [place], [place] [address] [name] of [place] Phone: [phone] Mobile Phone: [phone] Relation: Spouse Secondary Emergency Contact: Janey Genta Address: [address]          [place], [place] Phone: [phone] Mobile Phone: [phone] Relation: Daughter  Code Status:  Full Code Goals of care: Advanced Directive information Advanced Directives 09/26/2019  Does Patient Have a Medical Advance Directive? Yes  Type of Advance Directive Living will  Does patient want to make changes to medical advance directive? No - Patient declined  Copy of [place] in Chart? -  Would patient like information on creating a medical advance directive? -  Pre-existing out of facility DNR order (yellow form or pink MOST form) -     Chief Complaint  Patient presents with  . Medical Management of Chronic Issues    HPI:  Pt is a 86 y.o. male seen today for medical management of chronic diseases.     Patient has a history of CAD s/p PTCA, chronic atrial fibrillation on Eliquis, chronic diastolic CHF,Interstitial lung disease with hypoxia on chronic oxygen, hypertension, hyperlipidemia, OSA on CPAP, chronic back pain with spinal stenosis, S C-spine kyphosis, cognitive impairment  He is Long term resident now. Totally dependent for his ADLS. Need help with Ambulating and transfers. Does not want to work with therapy.  Today had no new Complains. Stays in his Room all day on his Recliner. No Coughing or SOB. On O2. Does not use his  CPAP Appetite is good. Has lost some weight Nurses have to use PRN Xanax to help with his Anxiety     Past Medical History:  Diagnosis Date  . Anxiety   . CAD S/P percutaneous coronary angioplasty 1996, 2009, 01/2010   PTCA of L Cx 1996; BMS-RCA [facility]) 2009; Staged PCI RCA (70% ISR) --> 3.0 mm x 23 mm BMS, staged PCI Diag - 2.0 mm x 12 mm Mini Vision BMS (2.25 mm)  . Cataracts, bilateral    immature  . Chronic back pain    scoliosis--pt states unable to have surgery bc of age  . GERD (gastroesophageal reflux disease)    takes Nexium daily  . History of colon polyps   . Hyperlipidemia   . Hypertension   . Myocardial infarction [facility]) 1996, 2009   x 2;last time was about 8-68yrs ago   . Obesity (BMI 30.0-34.9)   . OSA (obstructive sleep apnea)   . Osteoarthritis of both knees   . Peripheral edema    Venous Stasis  . Persistent atrial fibrillation (HCC)    Unable to maintaine NSR after DCCV with Tikosyn.  Plan = rate control w/diltiazem (beta blocker off b/c fatigue). CHA2DS2Vasc 4. DOAC - Eliquis.   . Prostate cancer ([place]) 2005   seed implant   Past Surgical History:  Procedure Laterality Date  . CARDIOVERSION N/A 06/12/2015   Procedure: CARDIOVERSION;  Surgeon: Sanda Klein, MD;  Location: [facility];  Service: Cardiovascular;  Laterality: N/A;  . CARDIOVERSION N/A 08/02/2015   Procedure: CARDIOVERSION;  Surgeon: [name], MD;  Location: [facility];  Service: Cardiovascular;  Laterality: N/A;  . CHOLECYSTECTOMY    . COLONOSCOPY    . CORONARY ANGIOPLASTY WITH STENT PLACEMENT  1996, 2009, August 2011   PTCA of L Cx 1996; BMS-RCA [facility]) 2009; Staged PCI RCA (70% ISR) --> 3.0 mm x 23 mm BMS, staged PCI Diag - 2.0 mm x 12 mm Mini Vision BMS (2.25 mm)  . cyst removed  in college  . NEUROPLASTY / TRANSPOSITION MEDIAN NERVE AT CARPAL TUNNEL BILATERAL    . NM MYOVIEW LTD  November 2013   EF 53%; fixed mid inferior-inferolateral defect, infarct with no ischemia.  . right knee  arthrsoscopy  80yrs ago  . right trigger finger    . seeds placed into prostate   2005  . TEE WITHOUT CARDIOVERSION N/A 06/12/2015   Procedure: TRANSESOPHAGEAL ECHOCARDIOGRAM (TEE) - WITH CARDIOVERSION;  Surgeon: Sanda Klein, MD;  Location: [facility];  Service: Cardiovascular: EF 55-60%.No LA or LAA thrombus. Normal Peal PAP ~33 mmHg.  [name] TOTAL KNEE ARTHROPLASTY  05/09/2012   Procedure: TOTAL KNEE ARTHROPLASTY;  Surgeon: Kerin Salen, MD;  Location: [facility];  Service: Orthopedics;  Laterality: Right;  . TRANSTHORACIC ECHOCARDIOGRAM  06/2015   EF 60-65%. Mild LA dilation. Normal PA pressures: 32 mmHg. Aortic sclerosis but no stenosis.  [name] wisdom teeth extraccted      Allergies  Allergen Reactions  . Crab [Shellfish Allergy] Rash  . Hydrocodone Hives  . Adhesive [Tape] Rash  . Oxycodone Rash    Allergies as of 09/26/2019      Reactions   Crab [shellfish Allergy] Rash   Hydrocodone Hives   Adhesive [tape] Rash   Oxycodone Rash      Medication List       Accurate as of September 26, 2019 10:45 AM. If you have any questions, ask your nurse or doctor.        acetaminophen 500 MG tablet Commonly known as: TYLENOL Take 1,000 mg by mouth. Three times a day as needed   ALPRAZolam 0.25 MG tablet Commonly known as: XANAX Take 1 tablet (0.25 mg total) by mouth 2 (two) times daily as needed for anxiety.   atorvastatin 40 MG tablet Commonly known as: LIPITOR TAKE 1 TABLET DAILY AT BEDTIME   barrier cream Crea Commonly known as: non-specified Apply 1 application topically. Zinc Oxide to buttocks area when assisting resident with toileting for redness every shift   diltiazem 240 MG 24 hr capsule Commonly known as: DILACOR XR Take 240 mg by mouth daily. For heart failure   Eliquis 5 MG Tabs tablet Generic drug: apixaban TAKE 1 TABLET TWICE A DAY (SCHEDULE FOLLOW UP WITH CARDIOLOGIST PRIOR TO NEXT REFILL REQUEST)   esomeprazole 40 MG capsule Commonly known as: NEXIUM TAKE 1  CAPSULE DAILY   ferrous sulfate 325 (65 FE) MG tablet Take 325 mg by mouth daily with breakfast.   furosemide 40 MG tablet Commonly known as: LASIX Take 40 mg by mouth daily.   losartan 100 MG tablet Commonly known as: COZAAR Take 50 mg by mouth daily.   magnesium oxide 400 MG tablet Commonly known as: MAG-OX Take 200 mg by mouth daily.   MIRALAX PO Take 17 g by mouth daily.   Myrbetriq 25 MG Tb24 tablet Generic drug: mirabegron ER Take 25 mg by mouth daily.   Nitrostat 0.4 MG SL tablet Generic drug: nitroGLYCERIN USE AS NEEDED   nystatin powder Commonly known as: MYCOSTATIN/NYSTOP Apply 1 application topically. Apply to back, R/L buttocks,  groin, and under breasts Twice A Day   OXYGEN Inhale 2 L into the lungs continuous. For obstructive sleep apnea   tamsulosin 0.4 MG Caps capsule Commonly known as: FLOMAX Take 0.4 mg by mouth daily.   Vitamin D3 25 MCG (1000 UT) Caps Take 1 capsule by mouth daily.       Review of Systems  Review of Systems  Constitutional: Negative for activity change, appetite change, chills, diaphoresis, fatigue and fever.  HENT: Negative for mouth sores, postnasal drip, rhinorrhea, sinus pain and sore throat.   Respiratory: Negative for apnea, cough, chest tightness, shortness of breath and wheezing.   Cardiovascular: Negative for chest pain, palpitations and leg swelling.  Gastrointestinal: Negative for abdominal distention, abdominal pain, constipation, diarrhea, nausea and vomiting.  Genitourinary: Negative for dysuria and frequency.  Musculoskeletal: Negative for arthralgias, joint swelling and myalgias.  Skin: Negative for rash.  Neurological: Negative for dizziness, syncope, weakness, light-headedness and numbness.  Psychiatric/Behavioral: Negative for behavioral problems, confusion and sleep disturbance.     Immunization History  Administered Date(s) Administered  . H1N1 04/13/2010  . Influenza, High Dose Seasonal PF  02/12/2014, 03/29/2018, 01/05/2019  . Influenza-Unspecified 03/06/2013, 02/12/2014, 03/06/2017  . Moderna SARS-COVID-2 Vaccination 06/08/2019, 07/06/2019  . Pneumococcal Conjugate-13 02/12/2014  . Pneumococcal Polysaccharide-23 02/04/2013, 02/12/2014  . Td 09/04/2005  . Tdap 12/11/2012  . Zoster 07/07/2006   Pertinent  Health Maintenance Due  Topic Date Due  . INFLUENZA VACCINE  01/05/2020  . PNA vac Low Risk Adult  Completed   No flowsheet data found. Functional Status Survey:    Vitals:   09/26/19 1039  BP: 120/70  Pulse: 72  Resp: 20  Temp: (!) 97.4 F (36.3 C)  SpO2: 96%  Weight: 211 lb (95.7 kg)  Height: 5\' 7"  (1.702 m)   Body mass index is 33.05 kg/m. Physical Exam Vitals reviewed.  Constitutional:      Appearance: He is obese.  HENT:     Head: Normocephalic.     Nose: Nose normal.     Mouth/Throat:     Mouth: Mucous membranes are moist.     Pharynx: Oropharynx is clear.  Eyes:     Pupils: Pupils are equal, round, and reactive to light.  Cardiovascular:     Rate and Rhythm: Normal rate and regular rhythm.  Pulmonary:     Effort: Pulmonary effort is normal. No respiratory distress.     Breath sounds: Normal breath sounds. No wheezing.  Abdominal:     General: Abdomen is flat. Bowel sounds are normal.     Palpations: Abdomen is soft.  Musculoskeletal:        General: Swelling present.     Cervical back: Neck supple.  Skin:    General: Skin is warm and dry.  Neurological:     General: No focal deficit present.     Mental Status: He is alert and oriented to person, place, and time.  Psychiatric:        Mood and Affect: Mood normal.        Behavior: Behavior normal.     Labs reviewed: Recent Labs    01/30/19 1700 01/30/19 1700 03/25/19 0000 03/26/19 0000 04/30/19 0000 08/27/19 0000  NA 138  --  141 142 137  --   K 3.8   < > 4.1 4.0 4.2  --   CL 101   < > 104 103 99  --   CO2 26   < > 28* 28* 29*  --  GLUCOSE 143*  --   --   --   --   --    BUN 14  --  16 17 20   --   CREATININE 0.95  --  0.8 0.8 0.9  --   CALCIUM 9.0   < > 8.5* 8.4* 8.5*  --   MG  --   --   --   --   --  1.5   < > = values in this interval not displayed.   Recent Labs    01/30/19 1700 03/26/19 0000 04/30/19 0000  AST 14* 8* 9*  ALT 9 4* 4*  ALKPHOS 77 69 77  BILITOT 0.4  --   --   PROT 6.9  --   --   ALBUMIN 3.2* 3.5  --    Recent Labs    01/30/19 1700 01/30/19 1700 03/26/19 0000 04/03/19 0000 04/30/19 0000  WBC 8.2  --  8.4 9.4 9.1  NEUTROABS 6.2  --   --  7,417 6,224  HGB 9.9*   < > 9.4* 9.6* 10.7*  HCT 33.6*   < > 30* 31* 34*  MCV 83.8  --   --   --   --   PLT 309   < > 357 327 285   < > = values in this interval not displayed.   Lab Results  Component Value Date   TSH 2.00 02/24/2016   No results found for: HGBA1C Lab Results  Component Value Date   CHOL 108 04/30/2019   HDL 41 04/30/2019   LDLCALC 50 04/30/2019   TRIG 88 04/30/2019   CHOLHDL 2.9 02/12/2014    Significant Diagnostic Results in last 30 days:  No results found.  Assessment/Plan ILD (interstitial lung disease) ([place]) Continue Chronic Oxygen Follows with Pulmonology No Aggressive treatment  Persistent atrial fibrillation ([place]):  CHA2DS2-VASc Score 4; On Eliquis On Cardizem  Rate Controlled On Eliquis Hypertension On Cozaar and Cardizem Diastolic congestive heart failure, unspecified HF chronicity (HCC) On Lasix Repeat BMP Physical deconditioning Does not want to work with therapy Mostly Non AMbulatory Hyperlipidemia with target LDL less than 70 On Lipitor Repeat Lipid Panel Iron deficiency anemia On Iron Sat have been low Not candidate  for Aggressive work up  CAD S/P percutaneous coronary angioplasty Follows with Cardiology On Statin OSA Refuses to use CPAP Urinary Incontinence ON Flomax and Myrbetriq Anxiety Continue with Xanax PRN 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

## 2019-09-27 ENCOUNTER — Other Ambulatory Visit: Payer: Self-pay | Admitting: *Deleted

## 2019-09-27 MED ORDER — ALPRAZOLAM 0.25 MG PO TABS: 0.2500 mg | ORAL_TABLET | Freq: Two times a day (BID) | ORAL | 2 refills | Status: DC | PRN

## 2019-09-27 NOTE — Telephone Encounter (Signed)
Received fax from [facility] Pended Rx and sent to Dr. Gupta for approval.  

## 2019-10-01 DIAGNOSIS — D649 Anemia, unspecified: Secondary | ICD-10-CM | POA: Diagnosis not present

## 2019-10-01 DIAGNOSIS — E785 Hyperlipidemia, unspecified: Secondary | ICD-10-CM | POA: Diagnosis not present

## 2019-10-01 DIAGNOSIS — R531 Weakness: Secondary | ICD-10-CM | POA: Diagnosis not present

## 2019-10-01 DIAGNOSIS — I10 Essential (primary) hypertension: Secondary | ICD-10-CM | POA: Diagnosis not present

## 2019-10-01 DIAGNOSIS — M6281 Muscle weakness (generalized): Secondary | ICD-10-CM | POA: Diagnosis not present

## 2019-10-02 LAB — COMPREHENSIVE METABOLIC PANEL
Albumin: 3.3 — AB (ref 3.5–5.0)
Calcium: 8.8 (ref 8.7–10.7)
Globulin: 2.5

## 2019-10-02 LAB — BASIC METABOLIC PANEL
BUN: 13 (ref 4–21)
CO2: 30 — AB (ref 13–22)
Chloride: 100 (ref 99–108)
Creatinine: 0.7 (ref 0.6–1.3)
Glucose: 95
Potassium: 3.7 (ref 3.4–5.3)
Sodium: 138 (ref 137–147)

## 2019-10-02 LAB — HEPATIC FUNCTION PANEL
ALT: 4 — AB (ref 10–40)
AST: 9 — AB (ref 14–40)
Alkaline Phosphatase: 75 (ref 25–125)
Bilirubin, Total: 0.6

## 2019-10-02 LAB — LIPID PANEL
Cholesterol: 109 (ref 0–200)
HDL: 40 (ref 35–70)
LDL Cholesterol: 52
LDl/HDL Ratio: 2.7
Triglycerides: 91 (ref 40–160)

## 2019-10-02 LAB — CBC: RBC: 3.99 (ref 3.87–5.11)

## 2019-10-02 LAB — CBC AND DIFFERENTIAL
HCT: 37 — AB (ref 41–53)
Hemoglobin: 12.9 — AB (ref 13.5–17.5)
Neutrophils Absolute: 6070
Platelets: 200 (ref 150–399)
WBC: 8.9

## 2019-10-02 LAB — TSH: TSH: 2.58 (ref 0.41–5.90)

## 2019-10-07 ENCOUNTER — Non-Acute Institutional Stay (SKILLED_NURSING_FACILITY): Payer: Medicare Other | Admitting: Nurse Practitioner

## 2019-10-07 ENCOUNTER — Encounter: Payer: Self-pay | Admitting: Nurse Practitioner

## 2019-10-07 DIAGNOSIS — K219 Gastro-esophageal reflux disease without esophagitis: Secondary | ICD-10-CM

## 2019-10-07 DIAGNOSIS — M79674 Pain in right toe(s): Secondary | ICD-10-CM

## 2019-10-07 DIAGNOSIS — F419 Anxiety disorder, unspecified: Secondary | ICD-10-CM

## 2019-10-07 DIAGNOSIS — J849 Interstitial pulmonary disease, unspecified: Secondary | ICD-10-CM

## 2019-10-07 DIAGNOSIS — I4819 Other persistent atrial fibrillation: Secondary | ICD-10-CM

## 2019-10-07 DIAGNOSIS — D508 Other iron deficiency anemias: Secondary | ICD-10-CM

## 2019-10-07 DIAGNOSIS — K5900 Constipation, unspecified: Secondary | ICD-10-CM | POA: Diagnosis not present

## 2019-10-07 DIAGNOSIS — I10 Essential (primary) hypertension: Secondary | ICD-10-CM | POA: Diagnosis not present

## 2019-10-07 DIAGNOSIS — I5030 Unspecified diastolic (congestive) heart failure: Secondary | ICD-10-CM | POA: Diagnosis not present

## 2019-10-07 DIAGNOSIS — R32 Unspecified urinary incontinence: Secondary | ICD-10-CM | POA: Diagnosis not present

## 2019-10-07 LAB — MAGNESIUM: Magnesium: 1.5

## 2019-10-07 NOTE — Assessment & Plan Note (Addendum)
Stable, continue Fe, Hgb 12.9 10/02/19

## 2019-10-07 NOTE — Assessment & Plan Note (Signed)
Heart rate is in control, continue Diltiazem, Eliquis.  °

## 2019-10-07 NOTE — Progress Notes (Signed)
Location:   SNF [place] Room Number: 5 Place of Service:  SNF (31) Provider: [place] Marae Cottrell NP  Virgie Dad, MD  Patient Care Team: Virgie Dad, MD as PCP - General (Internal Medicine) Leonie Mylin Hirano, MD as PCP - Cardiology (Cardiology)  Extended Emergency Contact Information Primary Emergency Contact: [facility] Address: [address]          [place], [place] [address] [name] of [place] Phone: [phone] Mobile Phone: [phone] Relation: Spouse Secondary Emergency Contact: Janey Genta Address: [address]          [place], [place] Phone: [phone] Mobile Phone: [phone] Relation: Daughter  Code Status:  DNR Goals of care: Advanced Directive information Advanced Directives 10/07/2019  Does Patient Have a Medical Advance Directive? Yes  Type of Advance Directive [place]  Does patient want to make changes to medical advance directive? No - Patient declined  Copy of [place] in Chart? Yes - validated most recent copy scanned in chart (See row information)  Would patient like information on creating a medical advance directive? -  Pre-existing out of facility DNR order (yellow form or pink MOST form) -     Chief Complaint  Patient presents with  . Medical Management of Chronic Issues    HPI:  Pt is a 86 y.o. male seen today for medical management of chronic diseases.    The patient resides in SNF [facility] for safety, care assistance, Interstitial lung disease, O2 dependent, CHF/minimal edema BLE, on Furosamide 40mg  qd.  OA multiple sits, prn Tylenol 1000mg  tid prn. His mood is stable, on Alprazolam 0.25mg  bid prn. Afib, heart rate is in control, on Diltiazem 240mg  qd, Eliquis 5mg  bid. GERD, stable, on Esomeprazole 40mg  qd. Anemia, stable, on Fe. HTN, blood pressure is controlled on Losartan 150mg  qd, Diltiazem 240mg  qd, Furosemide 40mg  qd.  BPH, no urinary  retention, on Mirabegron 25mg  qd, Tamsulosin 0.4mg  qd. Constipation, stable, on MIraLax qd.    Past Medical History:  Diagnosis Date  . Anxiety   . CAD S/P percutaneous coronary angioplasty 1996, 2009, 01/2010   PTCA of L Cx 1996; BMS-RCA [facility]) 2009; Staged PCI RCA (70% ISR) --> 3.0 mm x 23 mm BMS, staged PCI Diag - 2.0 mm x 12 mm Mini Vision BMS (2.25 mm)  . Cataracts, bilateral    immature  . Chronic back pain    scoliosis--pt states unable to have surgery bc of age  . GERD (gastroesophageal reflux disease)    takes Nexium daily  . History of colon polyps   . Hyperlipidemia   . Hypertension   . Myocardial infarction [facility]) 1996, 2009   x 2;last time was about 8-95yrs ago   . Obesity (BMI 30.0-34.9)   . OSA (obstructive sleep apnea)   . Osteoarthritis of both knees   . Peripheral edema    Venous Stasis  . Persistent atrial fibrillation (HCC)    Unable to maintaine NSR after DCCV with Tikosyn.  Plan = rate control w/diltiazem (beta blocker off b/c fatigue). CHA2DS2Vasc 4. DOAC - Eliquis.   . Prostate cancer ([place]) 2005   seed implant   Past Surgical History:  Procedure Laterality Date  . CARDIOVERSION N/A 06/12/2015   Procedure: CARDIOVERSION;  Surgeon: Sanda Klein, MD;  Location: [facility];  Service: Cardiovascular;  Laterality: N/A;  . CARDIOVERSION N/A 08/02/2015   Procedure: CARDIOVERSION;  Surgeon: [name], MD;  Location: [place];  Service:  Cardiovascular;  Laterality: N/A;  . CHOLECYSTECTOMY    . COLONOSCOPY    . CORONARY ANGIOPLASTY WITH STENT PLACEMENT  1996, 2009, August 2011   PTCA of L Cx 1996; BMS-RCA [facility]) 2009; Staged PCI RCA (70% ISR) --> 3.0 mm x 23 mm BMS, staged PCI Diag - 2.0 mm x 12 mm Mini Vision BMS (2.25 mm)  . cyst removed  in college  . NEUROPLASTY / TRANSPOSITION MEDIAN NERVE AT CARPAL TUNNEL BILATERAL    . NM MYOVIEW LTD  November 2013   EF 53%; fixed mid inferior-inferolateral defect, infarct with no ischemia.  . right knee arthrsoscopy   90yrs ago  . right trigger finger    . seeds placed into prostate   2005  . TEE WITHOUT CARDIOVERSION N/A 06/12/2015   Procedure: TRANSESOPHAGEAL ECHOCARDIOGRAM (TEE) - WITH CARDIOVERSION;  Surgeon: Sanda Klein, MD;  Location: [place];  Service: Cardiovascular: EF 55-60%.No LA or LAA thrombus. Normal Peal PAP ~33 mmHg.  [name] TOTAL KNEE ARTHROPLASTY  05/09/2012   Procedure: TOTAL KNEE ARTHROPLASTY;  Surgeon: Kerin Salen, MD;  Location: [place];  Service: Orthopedics;  Laterality: Right;  . TRANSTHORACIC ECHOCARDIOGRAM  06/2015   EF 60-65%. Mild LA dilation. Normal PA pressures: 32 mmHg. Aortic sclerosis but no stenosis.  [name] wisdom teeth extraccted      Allergies  Allergen Reactions  . Crab [Shellfish Allergy] Rash  . Hydrocodone Hives  . Adhesive [Tape] Rash  . Oxycodone Rash    Allergies as of 10/07/2019      Reactions   Crab [shellfish Allergy] Rash   Hydrocodone Hives   Adhesive [tape] Rash   Oxycodone Rash      Medication List       Accurate as of Oct 07, 2019  3:29 PM. If you have any questions, ask your nurse or doctor.        acetaminophen 500 MG tablet Commonly known as: TYLENOL Take 1,000 mg by mouth. Three times a day as needed   ALPRAZolam 0.25 MG tablet Commonly known as: XANAX Take 1 tablet (0.25 mg total) by mouth 2 (two) times daily as needed for anxiety. For 14 days   atorvastatin 40 MG tablet Commonly known as: LIPITOR TAKE 1 TABLET DAILY AT BEDTIME   barrier cream Crea Commonly known as: non-specified Apply 1 application topically. Zinc Oxide to buttocks area when assisting resident with toileting for redness every shift   Biofreeze 4 % Gel Generic drug: Menthol (Topical Analgesic) Apply topically in the morning, at noon, in the evening, and at bedtime.   diltiazem 240 MG 24 hr capsule Commonly known as: DILACOR XR Take 240 mg by mouth daily. For heart failure   Eliquis 5 MG Tabs tablet Generic drug: apixaban TAKE 1 TABLET TWICE A DAY  (SCHEDULE FOLLOW UP WITH CARDIOLOGIST PRIOR TO NEXT REFILL REQUEST)   esomeprazole 40 MG capsule Commonly known as: NEXIUM TAKE 1 CAPSULE DAILY   ferrous sulfate 325 (65 FE) MG tablet Take 325 mg by mouth daily with breakfast.   furosemide 40 MG tablet Commonly known as: LASIX Take 40 mg by mouth daily.   losartan 100 MG tablet Commonly known as: COZAAR Take 50 mg by mouth daily.   magnesium oxide 400 MG tablet Commonly known as: MAG-OX Take 200 mg by mouth daily.   MIRALAX PO Take 17 g by mouth daily.   Myrbetriq 25 MG Tb24 tablet Generic drug: mirabegron ER Take 25 mg by mouth daily.   Nitrostat 0.4 MG SL tablet  Generic drug: nitroGLYCERIN USE AS NEEDED   nystatin powder Commonly known as: MYCOSTATIN/NYSTOP Apply 1 application topically. Apply to back, R/L buttocks, groin, and under breasts Twice A Day   OXYGEN Inhale 2 L into the lungs continuous. For obstructive sleep apnea   tamsulosin 0.4 MG Caps capsule Commonly known as: FLOMAX Take 0.4 mg by mouth daily.   Vitamin D3 25 MCG (1000 UT) Caps Take 1 capsule by mouth daily.       Review of Systems  Constitutional: Negative for activity change, appetite change, fever and unexpected weight change.  HENT: Positive for hearing loss. Negative for congestion and voice change.   Eyes: Negative for visual disturbance.  Respiratory: Positive for shortness of breath. Negative for cough and wheezing.        O2 dependent.   Cardiovascular: Positive for leg swelling.  Gastrointestinal: Negative for abdominal pain and constipation.       Incontinent of feces.   Genitourinary: Negative for difficulty urinating, dysuria and urgency.       Incontinent of urine.   Musculoskeletal: Positive for arthralgias and gait problem.       Left knee pain is chronic. The right great toe pain at the base of the toe nail, but no warmth, redness, or swelling.   Skin: Negative for color change.  Neurological: Negative for dizziness,  weakness and headaches.       Memory lapses.   Psychiatric/Behavioral: Negative for hallucinations and sleep disturbance. The patient is not nervous/anxious.     Immunization History  Administered Date(s) Administered  . H1N1 04/13/2010  . Influenza, High Dose Seasonal PF 02/12/2014, 03/29/2018, 01/05/2019  . Influenza-Unspecified 03/06/2013, 02/12/2014, 03/06/2017  . Moderna SARS-COVID-2 Vaccination 06/08/2019, 07/06/2019  . Pneumococcal Conjugate-13 02/12/2014  . Pneumococcal Polysaccharide-23 02/04/2013, 02/12/2014  . Td 09/04/2005  . Tdap 12/11/2012  . Zoster 07/07/2006   Pertinent  Health Maintenance Due  Topic Date Due  . INFLUENZA VACCINE  01/05/2020  . PNA vac Low Risk Adult  Completed   No flowsheet data found. Functional Status Survey:    Vitals:   10/07/19 1506  BP: 124/70  Pulse: 78  Resp: 18  Temp: 98.1 F (36.7 C)  SpO2: 96%  Weight: 216 lb 3.2 oz (98.1 kg)  Height: 5\' 7"  (1.702 m)   Body mass index is 33.86 kg/m. Physical Exam Vitals and nursing note reviewed.  Constitutional:      Appearance: Normal appearance. He is obese.  HENT:     Head: Normocephalic and atraumatic.     Mouth/Throat:     Mouth: Mucous membranes are moist.  Eyes:     Extraocular Movements: Extraocular movements intact.     Conjunctiva/sclera: Conjunctivae normal.     Pupils: Pupils are equal, round, and reactive to light.  Cardiovascular:     Rate and Rhythm: Normal rate. Rhythm irregular.     Heart sounds: No murmur.     Comments: DP pulses not felt, BLE are sensitive to touch, no open wound Pulmonary:     Breath sounds: No wheezing.     Comments: O2 dependent.  Abdominal:     General: Bowel sounds are normal.     Palpations: Abdomen is soft.     Tenderness: There is no abdominal tenderness.  Musculoskeletal:     Cervical back: Normal range of motion and neck supple.     Right lower leg: Edema present.     Left lower leg: Edema present.     Comments: Trace edema  BLE.  Mechanical lift for transfer.   Skin:    General: Skin is warm and dry.     Comments: No redness, bruise, paronychia at the right great toe  Neurological:     General: No focal deficit present.     Mental Status: He is alert and oriented to person, place, and time. Mental status is at baseline.     Gait: Gait abnormal.  Psychiatric:        Mood and Affect: Mood normal.        Behavior: Behavior normal.        Thought Content: Thought content normal.        Judgment: Judgment normal.     Labs reviewed: Recent Labs    01/30/19 1700 03/25/19 0000 03/26/19 0000 04/30/19 0000 08/27/19 0000 10/02/19 0000  NA 138   < > 142 137  --  138  K 3.8   < > 4.0 4.2  --  3.7  CL 101   < > 103 99  --  100  CO2 26   < > 28* 29*  --  30*  GLUCOSE 143*  --   --   --   --   --   BUN 14   < > 17 20  --  13  CREATININE 0.95   < > 0.8 0.9  --  0.7  CALCIUM 9.0   < > 8.4* 8.5*  --  8.8  MG  --   --   --   --  1.5 1.5   < > = values in this interval not displayed.   Recent Labs    01/30/19 1700 01/30/19 1700 03/26/19 0000 04/30/19 0000 10/02/19 0000  AST 14*   < > 8* 9* 9*  ALT 9   < > 4* 4* 4*  ALKPHOS 77   < > 69 77 75  BILITOT 0.4  --   --   --   --   PROT 6.9  --   --   --   --   ALBUMIN 3.2*  --  3.5  --  3.3*   < > = values in this interval not displayed.   Recent Labs    01/30/19 1700 03/26/19 0000 04/03/19 0000 04/30/19 0000 10/02/19 0000  WBC 8.2   < > 9.4 9.1 8.9  NEUTROABS 6.2  --  7,417 6,224 6,070  HGB 9.9*   < > 9.6* 10.7* 12.9*  HCT 33.6*   < > 31* 34* 37*  MCV 83.8  --   --   --   --   PLT 309   < > 327 285 200   < > = values in this interval not displayed.   Lab Results  Component Value Date   TSH 2.58 10/02/2019   No results found for: HGBA1C Lab Results  Component Value Date   CHOL 109 10/02/2019   HDL 40 10/02/2019   LDLCALC 52 10/02/2019   TRIG 91 10/02/2019   CHOLHDL 2.9 02/12/2014    Significant Diagnostic Results in last 30 days:  No  results found.  Assessment/Plan  Great toe pain, right 03/27/19 ABI normal No redness, warmth, swelling, paronychia, pain is at the base of nail bed Will apply BioFreeze qid, observe.   Diastolic CHF (HCC) Compensated, minimal edema BLE, continue Furosemide.   Essential hypertension Blood pressure is controlled, continue Losartan 150mg  qd, Diltiazem 240mg , Furosemide 40mg  qd.   Persistent atrial fibrillation ([place]):  CHA2DS2-VASc Score 4; On Eliquis Heart  rate is in control, continue Diltiazem, Eliquis.   ILD (interstitial lung disease) ([place]) Continue O2 via [place]  GERD (gastroesophageal reflux disease) Stable, continue Esomeprazole.   Anxiety His mood is stable, continue prn Alprazolam.   Constipation Stable, continue MiraLax.   Iron deficiency anemia Stable, continue Fe, Hgb 12.9 10/02/19  Urinary incontinence Stable, continue Mirabegron, Tamsulosin.    Family/ staff Communication: plan of care reviewed with the patient and charge nurse.   Labs/tests ordered: none  Time spend 25 minutes.

## 2019-10-07 NOTE — Assessment & Plan Note (Signed)
Stable, continue MiraLax.  °

## 2019-10-07 NOTE — Assessment & Plan Note (Signed)
Compensated, minimal edema BLE, continue Furosemide.  

## 2019-10-07 NOTE — Assessment & Plan Note (Signed)
03/27/19 ABI normal No redness, warmth, swelling, paronychia, pain is at the base of nail bed Will apply BioFreeze qid, observe.

## 2019-10-07 NOTE — Assessment & Plan Note (Signed)
Continue O2 via [place]

## 2019-10-07 NOTE — Assessment & Plan Note (Signed)
Blood pressure is controlled, continue Losartan 150mg  qd, Diltiazem 240mg , Furosemide 40mg  qd.

## 2019-10-07 NOTE — Assessment & Plan Note (Signed)
Stable, continue Esomeprazole.  

## 2019-10-07 NOTE — Assessment & Plan Note (Signed)
His mood is stable, continue prn Alprazolam.

## 2019-10-07 NOTE — Assessment & Plan Note (Signed)
Stable, continue Mirabegron, Tamsulosin.

## 2019-10-25 IMAGING — DX CHEST - 2 VIEW
2 series · 2 of 2 positions shown · non-contrast
Comparison: Chest radiographs 08/22/2017 and CT 11/23/2018

CLINICAL DATA: Shortness of breath. Possible cellulitis of the left
foot.

EXAM:
CHEST - 2 VIEW

[w chest lat]
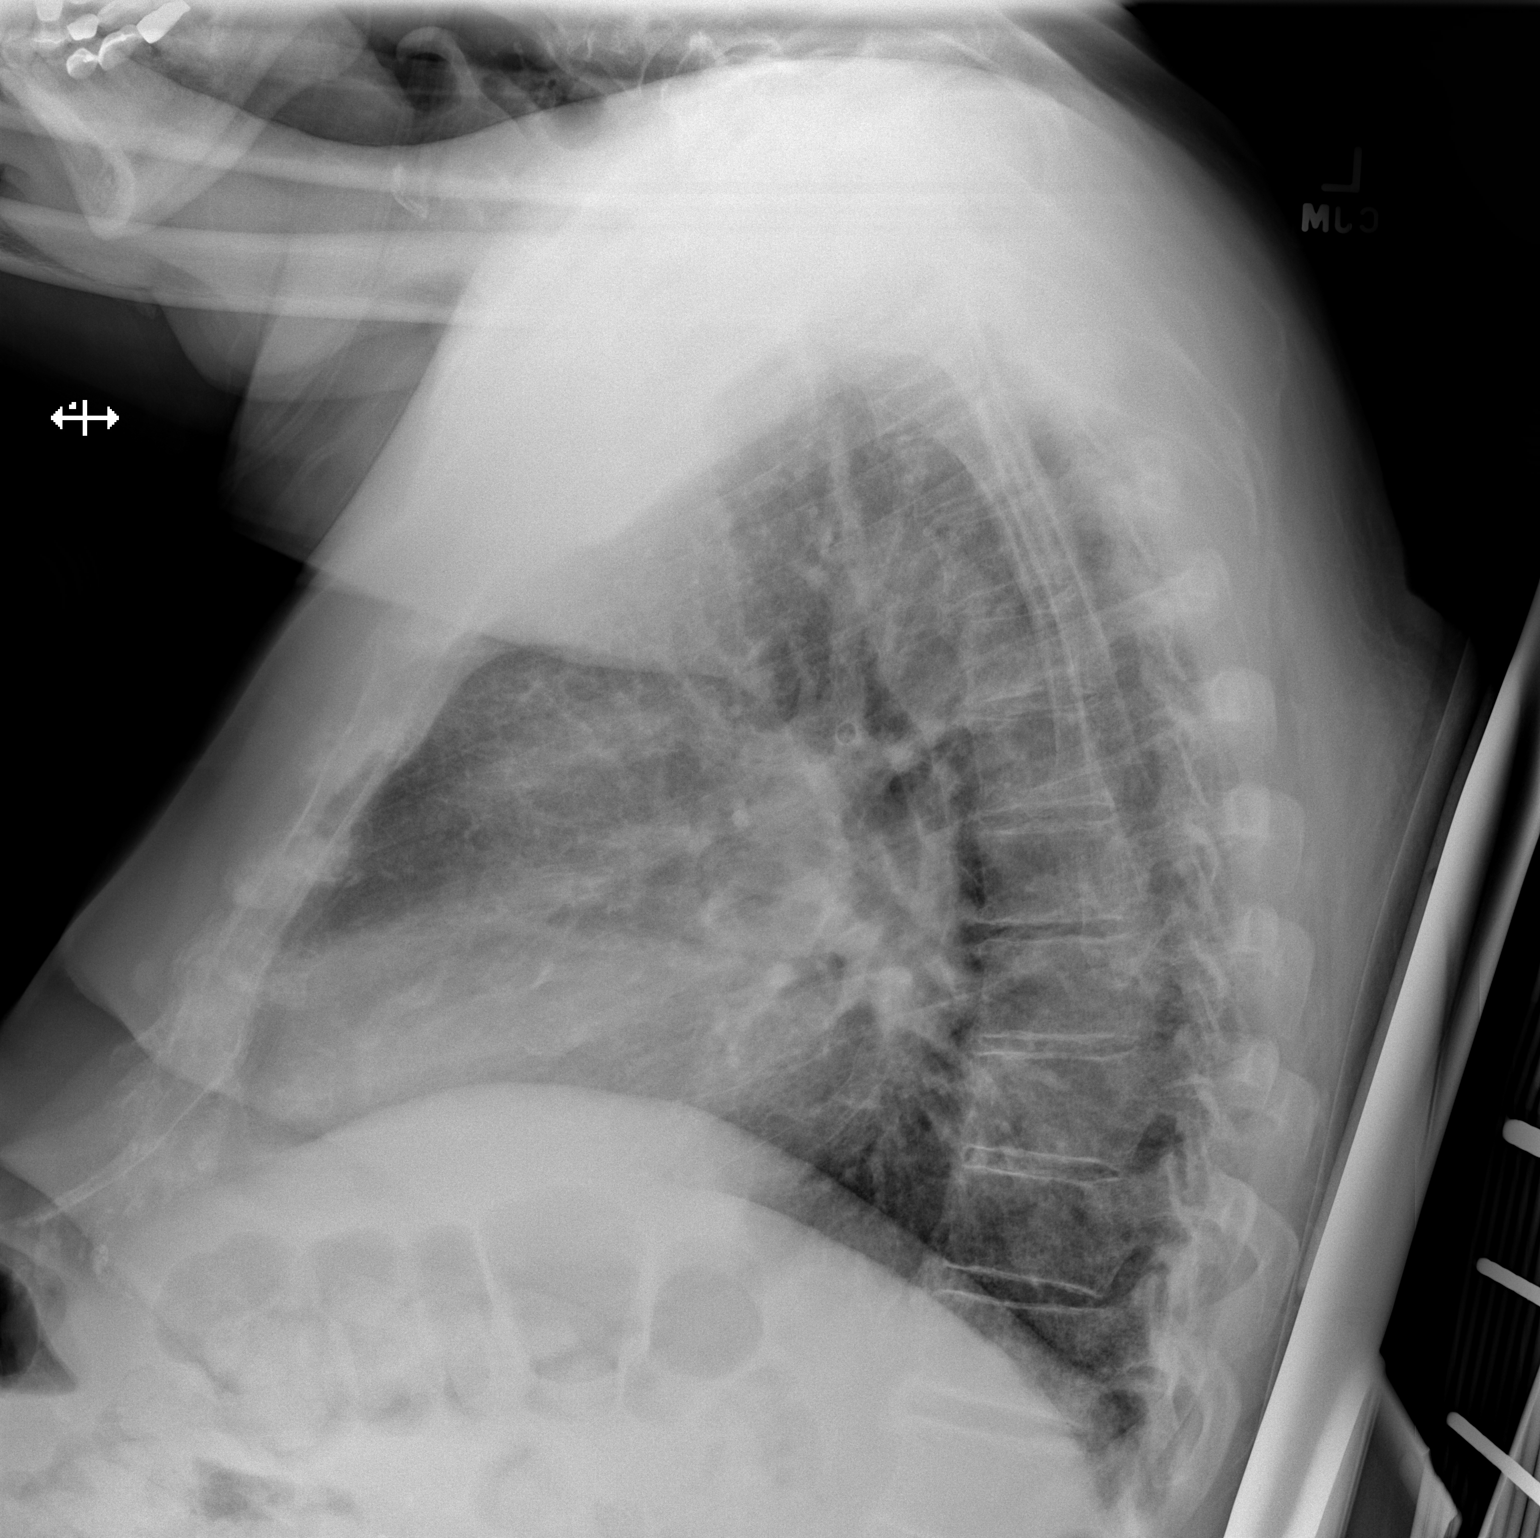

[x chest ap]
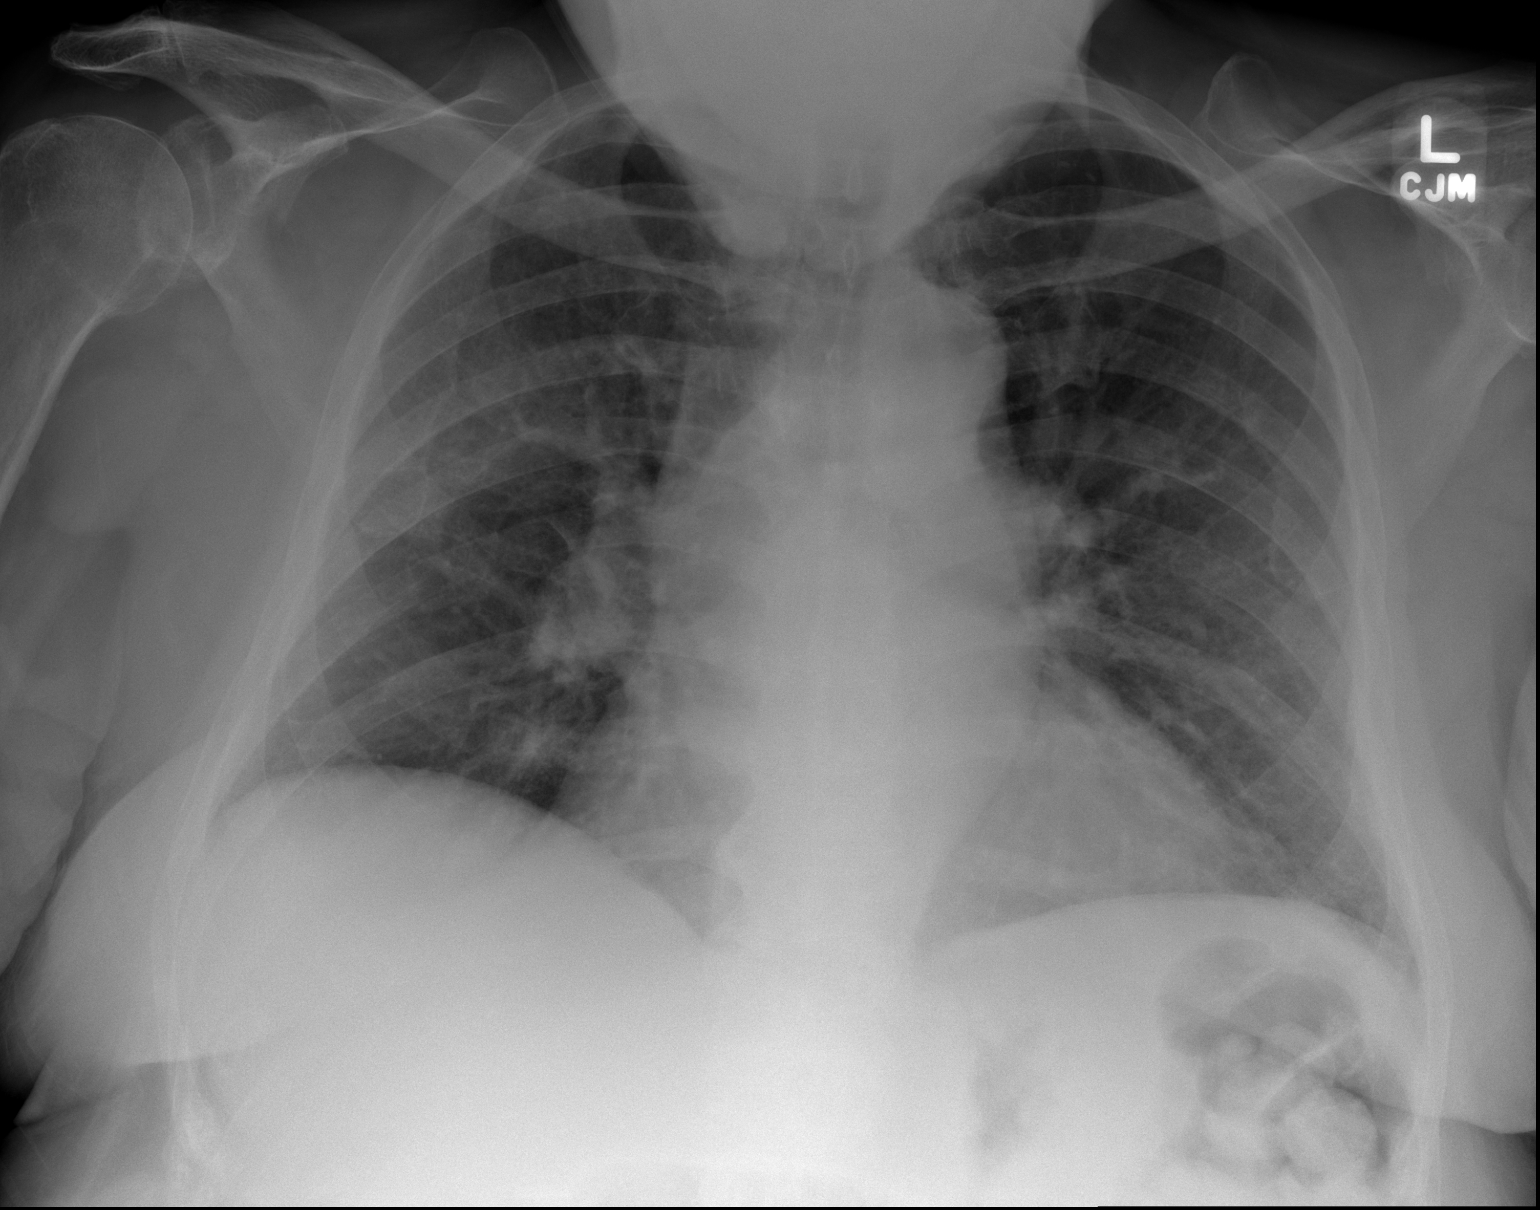

[2 of 2 positions shown; findings below may reference images not displayed]

FINDINGS: The cardiomediastinal silhouette is unchanged with borderline to
mild cardiomegaly. Lung volumes are unchanged from the prior
radiographs with similar appearance of chronic interstitial
densities bilaterally. No superimposed acute airspace consolidation,
overt pulmonary edema, pleural effusion, pneumothorax is identified.
No acute osseous abnormality is seen.
IMPRESSION: Chronic interstitial lung disease without evidence of acute
cardiopulmonary process.

## 2019-10-29 DIAGNOSIS — M79671 Pain in right foot: Secondary | ICD-10-CM | POA: Diagnosis not present

## 2019-10-29 DIAGNOSIS — M79672 Pain in left foot: Secondary | ICD-10-CM | POA: Diagnosis not present

## 2019-10-29 DIAGNOSIS — B351 Tinea unguium: Secondary | ICD-10-CM | POA: Diagnosis not present

## 2019-11-06 ENCOUNTER — Encounter: Payer: Self-pay | Admitting: Nurse Practitioner

## 2019-11-06 ENCOUNTER — Non-Acute Institutional Stay (SKILLED_NURSING_FACILITY): Payer: Medicare Other | Admitting: Nurse Practitioner

## 2019-11-06 DIAGNOSIS — I10 Essential (primary) hypertension: Secondary | ICD-10-CM

## 2019-11-06 DIAGNOSIS — F419 Anxiety disorder, unspecified: Secondary | ICD-10-CM

## 2019-11-06 DIAGNOSIS — D508 Other iron deficiency anemias: Secondary | ICD-10-CM

## 2019-11-06 DIAGNOSIS — I5030 Unspecified diastolic (congestive) heart failure: Secondary | ICD-10-CM

## 2019-11-06 DIAGNOSIS — J849 Interstitial pulmonary disease, unspecified: Secondary | ICD-10-CM

## 2019-11-06 DIAGNOSIS — K219 Gastro-esophageal reflux disease without esophagitis: Secondary | ICD-10-CM | POA: Diagnosis not present

## 2019-11-06 DIAGNOSIS — K5900 Constipation, unspecified: Secondary | ICD-10-CM | POA: Diagnosis not present

## 2019-11-06 DIAGNOSIS — R32 Unspecified urinary incontinence: Secondary | ICD-10-CM

## 2019-11-06 DIAGNOSIS — I4819 Other persistent atrial fibrillation: Secondary | ICD-10-CM | POA: Diagnosis not present

## 2019-11-06 NOTE — Assessment & Plan Note (Signed)
Stable, continue O2 via [place]

## 2019-11-06 NOTE — Assessment & Plan Note (Addendum)
Stable, continue Tamsulosin, Mirabegron.

## 2019-11-06 NOTE — Progress Notes (Signed)
Location:  [facility] Room Number: 5 Place of Service:  SNF (31) Provider:  Waneda Klammer Darlina Rumpf, NP  Patient Care Team: Virgie Dad, MD as PCP - General (Internal Medicine) Leonie Toby Breithaupt, MD as PCP - Cardiology (Cardiology)  Extended Emergency Contact Information Primary Emergency Contact: [facility] Address: [address]          [place], [place] [address] [name] Phone: [phone] Mobile Phone: [phone] Relation: Spouse Secondary Emergency Contact: Janey Genta Address: [address]          [place], [place] Phone: [phone] Mobile Phone: [phone] Relation: Daughter  Code Status:  DNR Goals of care: Advanced Directive information Advanced Directives 10/07/2019  Does Patient Have a Medical Advance Directive? Yes  Type of Advance Directive [place]  Does patient want to make changes to medical advance directive? No - Patient declined  Copy of [place] in Chart? Yes - validated most recent copy scanned in chart (See row information)  Would patient like information on creating a medical advance directive? -  Pre-existing out of facility DNR order (yellow form or pink MOST form) -     Chief Complaint  Patient presents with  . Medical Management of Chronic Issues    Routine [facility]    HPI:  Pt is an 86 y.o. male seen today for medical management of chronic diseases.    The patient resides in SNF [facility],  Hx of OA, uses mechanical lift for transfer, takes prn Tylenol. Hx of Interstitial lung disease, O2 dependent, CHF/minimal edema BLE, on Furosamide 40mg  qd. His mood is stable, on Alprazolam 0.25mg  bid prn. Afib, heart rate is in control, on Diltiazem 240mg  qd, Eliquis 5mg  bid. GERD, stable, on Esomeprazole 40mg  qd. Anemia, stable, on Fe. HTN, blood pressure is controlled on Losartan 50mg  qd, Diltiazem 240mg  qd, Furosemide 40mg   qd. Urinary incontinent, no urinary retention, on Mirabegron 25mg  qd, Tamsulosin 0.4mg  qd. Constipation, stable, on MiraLax qd.    Past Medical History:  Diagnosis Date  . Anxiety   . CAD S/P percutaneous coronary angioplasty 1996, 2009, 01/2010   PTCA of L Cx 1996; BMS-RCA [facility]) 2009; Staged PCI RCA (70% ISR) --> 3.0 mm x 23 mm BMS, staged PCI Diag - 2.0 mm x 12 mm Mini Vision BMS (2.25 mm)  . Cataracts, bilateral    immature  . Chronic back pain    scoliosis--pt states unable to have surgery bc of age  . GERD (gastroesophageal reflux disease)    takes Nexium daily  . History of colon polyps   . Hyperlipidemia   . Hypertension   . Myocardial infarction [facility]) 1996, 2009   x 2;last time was about 8-76yrs ago   . Obesity (BMI 30.0-34.9)   . OSA (obstructive sleep apnea)   . Osteoarthritis of both knees   . Peripheral edema    Venous Stasis  . Persistent atrial fibrillation (HCC)    Unable to maintaine NSR after DCCV with Tikosyn.  Plan = rate control w/diltiazem (beta blocker off b/c fatigue). CHA2DS2Vasc 4. DOAC - Eliquis.   . Prostate cancer ([place]) 2005   seed implant   Past Surgical History:  Procedure Laterality Date  . CARDIOVERSION N/A 06/12/2015   Procedure: CARDIOVERSION;  Surgeon: Sanda Klein, MD;  Location: [facility];  Service: Cardiovascular;  Laterality: N/A;  . CARDIOVERSION N/A 08/02/2015   Procedure: CARDIOVERSION;  Surgeon: [name], MD;  Location: [facility] OR;  Service: Cardiovascular;  Laterality: N/A;  . CHOLECYSTECTOMY    . COLONOSCOPY    . CORONARY ANGIOPLASTY WITH STENT PLACEMENT  1996, 2009, August 2011   PTCA of L Cx 1996; BMS-RCA [facility]) 2009; Staged PCI RCA (70% ISR) --> 3.0 mm x 23 mm BMS, staged PCI Diag - 2.0 mm x 12 mm Mini Vision BMS (2.25 mm)  . cyst removed  in college  . NEUROPLASTY / TRANSPOSITION MEDIAN NERVE AT CARPAL TUNNEL BILATERAL    . NM MYOVIEW LTD  November 2013   EF 53%; fixed mid inferior-inferolateral defect, infarct with no  ischemia.  . right knee arthrsoscopy  18yrs ago  . right trigger finger    . seeds placed into prostate   2005  . TEE WITHOUT CARDIOVERSION N/A 06/12/2015   Procedure: TRANSESOPHAGEAL ECHOCARDIOGRAM (TEE) - WITH CARDIOVERSION;  Surgeon: Sanda Klein, MD;  Location: [facility];  Service: Cardiovascular: EF 55-60%.No LA or LAA thrombus. Normal Peal PAP ~33 mmHg.  [name] TOTAL KNEE ARTHROPLASTY  05/09/2012   Procedure: TOTAL KNEE ARTHROPLASTY;  Surgeon: Kerin Salen, MD;  Location: [facility];  Service: Orthopedics;  Laterality: Right;  . TRANSTHORACIC ECHOCARDIOGRAM  06/2015   EF 60-65%. Mild LA dilation. Normal PA pressures: 32 mmHg. Aortic sclerosis but no stenosis.  [name] wisdom teeth extraccted      Allergies  Allergen Reactions  . Crab [Shellfish Allergy] Rash  . Hydrocodone Hives  . Adhesive [Tape] Rash  . Oxycodone Rash    Outpatient Encounter Medications as of 11/06/2019  Medication Sig  . acetaminophen (TYLENOL) 500 MG tablet Take 1,000 mg by mouth. Three times a day as needed  . ALPRAZolam (XANAX) 0.25 MG tablet Take 1 tablet (0.25 mg total) by mouth 2 (two) times daily as needed for anxiety. For 14 days  . atorvastatin (LIPITOR) 40 MG tablet TAKE 1 TABLET DAILY AT BEDTIME  . barrier cream (NON-SPECIFIED) CREA Apply 1 application topically. Zinc Oxide to buttocks area when assisting resident with toileting for redness every shift  . Cholecalciferol (VITAMIN D3) 1000 units CAPS Take 1 capsule by mouth daily.   [name] diltiazem (DILACOR XR) 240 MG 24 hr capsule Take 240 mg by mouth daily. For heart failure  . ELIQUIS 5 MG TABS tablet TAKE 1 TABLET TWICE A DAY (SCHEDULE FOLLOW UP WITH CARDIOLOGIST PRIOR TO NEXT REFILL REQUEST)  . esomeprazole (NEXIUM) 40 MG capsule TAKE 1 CAPSULE DAILY  . ferrous sulfate 325 (65 FE) MG tablet Take 325 mg by mouth daily with breakfast.  . furosemide (LASIX) 40 MG tablet Take 40 mg by mouth daily.  [name] losartan (COZAAR) 100 MG tablet Take 50 mg by mouth daily.  .  magnesium oxide (MAG-OX) 400 MG tablet Take 200 mg by mouth daily.  . Menthol, Topical Analgesic, (BIOFREEZE) 4 % GEL Apply topically in the morning, at noon, in the evening, and at bedtime.  . mirabegron ER (MYRBETRIQ) 25 MG TB24 tablet Take 25 mg by mouth daily.   [name] NITROSTAT 0.4 MG SL tablet USE AS NEEDED  . nystatin (MYCOSTATIN/NYSTOP) powder Apply 1 application topically. Apply to back, R/L buttocks, groin, and under breasts Twice A Day  . OXYGEN Inhale 2 L into the lungs continuous. For obstructive sleep apnea  . Polyethylene Glycol 3350 (MIRALAX PO) Take 17 g by mouth daily.   . Sodium Fluoride (PREVIDENT 5000 BOOSTER PLUS) 1.1 % PSTE Place 1 application onto teeth at bedtime.  . tamsulosin (FLOMAX) 0.4 MG CAPS capsule Take 0.4  mg by mouth daily.    No facility-administered encounter medications on file as of 11/06/2019.    Review of Systems  Constitutional: Negative for activity change, fever and unexpected weight change.  HENT: Positive for hearing loss. Negative for congestion and voice change.   Eyes: Negative for visual disturbance.  Respiratory: Positive for shortness of breath. Negative for cough and wheezing.        O2 dependent.   Cardiovascular: Positive for leg swelling.  Gastrointestinal: Negative for abdominal pain and constipation.       Incontinent of feces.   Genitourinary: Negative for difficulty urinating, dysuria and urgency.       Incontinent of urine.   Musculoskeletal: Positive for arthralgias and gait problem.       Left knee pain is chronic. The right great toe pain at the base of the toe nail, but no warmth, redness, or swelling.   Skin: Negative for color change.  Neurological: Negative for dizziness, weakness and headaches.       Memory lapses.   Psychiatric/Behavioral: Negative for behavioral problems and sleep disturbance.    Immunization History  Administered Date(s) Administered  . H1N1 04/13/2010  . Influenza, High Dose Seasonal PF 02/12/2014,  03/29/2018, 01/05/2019  . Influenza-Unspecified 03/06/2013, 02/12/2014, 03/06/2017  . Moderna SARS-COVID-2 Vaccination 06/08/2019, 07/06/2019  . Pneumococcal Conjugate-13 02/12/2014  . Pneumococcal Polysaccharide-23 02/04/2013, 02/12/2014  . Td 09/04/2005  . Tdap 12/11/2012  . Zoster 07/07/2006   Pertinent  Health Maintenance Due  Topic Date Due  . INFLUENZA VACCINE  01/05/2020  . PNA vac Low Risk Adult  Completed   No flowsheet data found. Functional Status Survey:    Vitals:   11/06/19 1357  BP: 138/68  Pulse: 79  Resp: 20  Temp: (!) 96.9 F (36.1 C)  TempSrc: Oral  SpO2: 95%  Weight: 216 lb (98 kg)  Height: 5\' 7"  (1.702 m)   Body mass index is 33.83 kg/m. Physical Exam Vitals and nursing note reviewed.  Constitutional:      Appearance: Normal appearance. He is obese.  HENT:     Head: Normocephalic and atraumatic.     Nose: Nose normal.     Mouth/Throat:     Mouth: Mucous membranes are moist.  Eyes:     Extraocular Movements: Extraocular movements intact.     Conjunctiva/sclera: Conjunctivae normal.     Pupils: Pupils are equal, round, and reactive to light.  Cardiovascular:     Rate and Rhythm: Normal rate. Rhythm irregular.     Heart sounds: No murmur.     Comments: DP pulses not felt, BLE are sensitive to touch, no open wound Pulmonary:     Breath sounds: No wheezing.     Comments: O2 dependent.  Abdominal:     Palpations: Abdomen is soft.     Tenderness: There is no abdominal tenderness.  Musculoskeletal:     Cervical back: Normal range of motion and neck supple.     Right lower leg: Edema present.     Left lower leg: Edema present.     Comments: Trace edema BLE. Mechanical lift for transfer.   Skin:    General: Skin is warm and dry.     Comments: No redness, bruise, paronychia at the right great toe  Neurological:     General: No focal deficit present.     Mental Status: He is alert and oriented to person, place, and time. Mental status is at  baseline.     Gait: Gait abnormal.  Psychiatric:        Mood and Affect: Mood normal.        Behavior: Behavior normal.        Thought Content: Thought content normal.        Judgment: Judgment normal.     Labs reviewed: Recent Labs    01/30/19 1700 03/25/19 0000 03/26/19 0000 04/30/19 0000 08/27/19 0000 10/02/19 0000  NA 138   < > 142 137  --  138  K 3.8   < > 4.0 4.2  --  3.7  CL 101   < > 103 99  --  100  CO2 26   < > 28* 29*  --  30*  GLUCOSE 143*  --   --   --   --   --   BUN 14   < > 17 20  --  13  CREATININE 0.95   < > 0.8 0.9  --  0.7  CALCIUM 9.0   < > 8.4* 8.5*  --  8.8  MG  --   --   --   --  1.5 1.5   < > = values in this interval not displayed.   Recent Labs    01/30/19 1700 01/30/19 1700 03/26/19 0000 04/30/19 0000 10/02/19 0000  AST 14*   < > 8* 9* 9*  ALT 9   < > 4* 4* 4*  ALKPHOS 77   < > 69 77 75  BILITOT 0.4  --   --   --   --   PROT 6.9  --   --   --   --   ALBUMIN 3.2*  --  3.5  --  3.3*   < > = values in this interval not displayed.   Recent Labs    01/30/19 1700 03/26/19 0000 04/03/19 0000 04/30/19 0000 10/02/19 0000  WBC 8.2   < > 9.4 9.1 8.9  NEUTROABS 6.2  --  7,417 6,224 6,070  HGB 9.9*   < > 9.6* 10.7* 12.9*  HCT 33.6*   < > 31* 34* 37*  MCV 83.8  --   --   --   --   PLT 309   < > 327 285 200   < > = values in this interval not displayed.   Lab Results  Component Value Date   TSH 2.58 10/02/2019   No results found for: HGBA1C Lab Results  Component Value Date   CHOL 109 10/02/2019   HDL 40 10/02/2019   LDLCALC 52 10/02/2019   TRIG 91 10/02/2019   CHOLHDL 2.9 02/12/2014    Significant Diagnostic Results in last 30 days:  No results found.  Assessment/Plan Urinary incontinence Stable, continue Tamsulosin, Mirabegron.   Iron deficiency anemia Improved, Hgb 12.9 10/02/19, Hgb 9.6 04/02/19, continue Fe  Constipation Stable, continue MiraLax qd.   Essential hypertension Blood pressure is controlled, continue  Losartan 50mg  qd, Diltiazem 240mg  qd, Furosemide 40mg  qd.   Persistent atrial fibrillation ([place]):  CHA2DS2-VASc Score 4; On Eliquis heart rate is in control, continue  Diltiazem 240mg  qd, Eliquis 5mg  bid.   Diastolic CHF ([place]) Compensated, continue Furosemide.   ILD (interstitial lung disease) (HCC) Stable, continue O2 via [place]   GERD (gastroesophageal reflux disease) Stable, continue Esomeprazole  Anxiety Continue prn Alprazolam for mood, sleep     Family/ staff Communication: plan of care reviewed with the patient and charge nurse.   Labs/tests ordered:  None  Time spend 25 minutes.

## 2019-11-06 NOTE — Assessment & Plan Note (Signed)
Compensated, continue Furosemide.  

## 2019-11-06 NOTE — Assessment & Plan Note (Signed)
Improved, Hgb 12.9 10/02/19, Hgb 9.6 04/02/19, continue Fe

## 2019-11-06 NOTE — Assessment & Plan Note (Signed)
Stable, continue MiraLax qd.  

## 2019-11-06 NOTE — Assessment & Plan Note (Signed)
Blood pressure is controlled, continue Losartan 50mg  qd, Diltiazem 240mg  qd, Furosemide 40mg  qd.

## 2019-11-06 NOTE — Assessment & Plan Note (Signed)
Stable, continue Esomeprazole.  

## 2019-11-06 NOTE — Assessment & Plan Note (Signed)
Continue prn Alprazolam for mood, sleep

## 2019-11-06 NOTE — Assessment & Plan Note (Signed)
heart rate is in control, continue  Diltiazem 240mg  qd, Eliquis 5mg  bid.

## 2019-12-05 ENCOUNTER — Non-Acute Institutional Stay (SKILLED_NURSING_FACILITY): Payer: Medicare Other | Admitting: Internal Medicine

## 2019-12-05 ENCOUNTER — Encounter: Payer: Self-pay | Admitting: Internal Medicine

## 2019-12-05 DIAGNOSIS — G4733 Obstructive sleep apnea (adult) (pediatric): Secondary | ICD-10-CM

## 2019-12-05 DIAGNOSIS — F419 Anxiety disorder, unspecified: Secondary | ICD-10-CM

## 2019-12-05 DIAGNOSIS — R32 Unspecified urinary incontinence: Secondary | ICD-10-CM | POA: Diagnosis not present

## 2019-12-05 DIAGNOSIS — I4819 Other persistent atrial fibrillation: Secondary | ICD-10-CM | POA: Diagnosis not present

## 2019-12-05 DIAGNOSIS — I2510 Atherosclerotic heart disease of native coronary artery without angina pectoris: Secondary | ICD-10-CM

## 2019-12-05 DIAGNOSIS — D508 Other iron deficiency anemias: Secondary | ICD-10-CM

## 2019-12-05 DIAGNOSIS — I10 Essential (primary) hypertension: Secondary | ICD-10-CM

## 2019-12-05 DIAGNOSIS — G3184 Mild cognitive impairment, so stated: Secondary | ICD-10-CM

## 2019-12-05 DIAGNOSIS — E669 Obesity, unspecified: Secondary | ICD-10-CM

## 2019-12-05 DIAGNOSIS — Z9861 Coronary angioplasty status: Secondary | ICD-10-CM

## 2019-12-05 DIAGNOSIS — I5030 Unspecified diastolic (congestive) heart failure: Secondary | ICD-10-CM

## 2019-12-05 DIAGNOSIS — R5381 Other malaise: Secondary | ICD-10-CM

## 2019-12-05 DIAGNOSIS — E785 Hyperlipidemia, unspecified: Secondary | ICD-10-CM

## 2019-12-05 DIAGNOSIS — J849 Interstitial pulmonary disease, unspecified: Secondary | ICD-10-CM

## 2019-12-05 NOTE — Progress Notes (Signed)
Location:   [facility] Room Number: 5 Place of Service:  SNF [id number]) Provider: Veleta Miners MD   Virgie Dad, MD  Patient Care Team: Virgie Dad, MD as PCP - General (Internal Medicine) Leonie Man, MD as PCP - Cardiology (Cardiology)  Extended Emergency Contact Information Primary Emergency Contact: [facility] Address: [address]          [place], [place] [address] [name] of [place] Phone: [phone] Mobile Phone: [phone] Relation: Spouse Secondary Emergency Contact: Janey Genta Address: [address]          [place], [place] Phone: [phone] Mobile Phone: [phone] Relation: Daughter  Code Status:  Full Code Goals of care: Advanced Directive information Advanced Directives 12/05/2019  Does Patient Have a Medical Advance Directive? Yes  Type of Advance Directive Living will  Does patient want to make changes to medical advance directive? No - Patient declined  Copy of [place] in Chart? -  Would patient like information on creating a medical advance directive? -  Pre-existing out of facility DNR order (yellow form or pink MOST form) -     Chief Complaint  Patient presents with  . Medical Management of Chronic Issues    HPI:  Pt is a 86 y.o. male seen today for medical management of chronic diseases.    Patient has a history of CAD s/p PTCA, chronic atrial fibrillation on Eliquis, chronic diastolic CHF,Interstitial lung disease with hypoxia on chronic oxygen, hypertension, hyperlipidemia, OSA on CPAP, chronic back pain with spinal stenosis, S C-spine kyphosis, cognitive impairment  Long term Resident. He is totally dependent for his ADLs.  Needs help with ambulating and transfers.  Stays in his room in his recliner all day long.  Patient does not have any new complaints today. Has gained 5 pounds since my last visit.  Needs Xanax as needed for his  anxiety. Past Medical History:  Diagnosis Date  . Anxiety   . CAD S/P percutaneous coronary angioplasty 1996, 2009, 01/2010   PTCA of L Cx 1996; BMS-RCA [facility]) 2009; Staged PCI RCA (70% ISR) --> 3.0 mm x 23 mm BMS, staged PCI Diag - 2.0 mm x 12 mm Mini Vision BMS (2.25 mm)  . Cataracts, bilateral    immature  . Chronic back pain    scoliosis--pt states unable to have surgery bc of age  . GERD (gastroesophageal reflux disease)    takes Nexium daily  . History of colon polyps   . Hyperlipidemia   . Hypertension   . Myocardial infarction [facility]) 1996, 2009   x 2;last time was about 8-1yrs ago   . Obesity (BMI 30.0-34.9)   . OSA (obstructive sleep apnea)   . Osteoarthritis of both knees   . Peripheral edema    Venous Stasis  . Persistent atrial fibrillation (HCC)    Unable to maintaine NSR after DCCV with Tikosyn.  Plan = rate control w/diltiazem (beta blocker off b/c fatigue). CHA2DS2Vasc 4. DOAC - Eliquis.   . Prostate cancer ([place]) 2005   seed implant   Past Surgical History:  Procedure Laterality Date  . CARDIOVERSION N/A 06/12/2015   Procedure: CARDIOVERSION;  Surgeon: Sanda Klein, MD;  Location: [place] [facility] [facility];  Service: Cardiovascular;  Laterality: N/A;  . CARDIOVERSION N/A 08/02/2015   Procedure: CARDIOVERSION;  Surgeon: [name], MD;  Location: [facility];  Service: Cardiovascular;  Laterality: N/A;  . CHOLECYSTECTOMY    . COLONOSCOPY    .  CORONARY ANGIOPLASTY WITH STENT PLACEMENT  1996, 2009, August 2011   PTCA of L Cx 1996; BMS-RCA [facility]) 2009; Staged PCI RCA (70% ISR) --> 3.0 mm x 23 mm BMS, staged PCI Diag - 2.0 mm x 12 mm Mini Vision BMS (2.25 mm)  . cyst removed  in college  . NEUROPLASTY / TRANSPOSITION MEDIAN NERVE AT CARPAL TUNNEL BILATERAL    . NM MYOVIEW LTD  November 2013   EF 53%; fixed mid inferior-inferolateral defect, infarct with no ischemia.  . right knee arthrsoscopy  2yrs ago  . right trigger finger    . seeds placed into prostate   2005  . TEE  WITHOUT CARDIOVERSION N/A 06/12/2015   Procedure: TRANSESOPHAGEAL ECHOCARDIOGRAM (TEE) - WITH CARDIOVERSION;  Surgeon: Sanda Klein, MD;  Location: [facility];  Service: Cardiovascular: EF 55-60%.No LA or LAA thrombus. Normal Peal PAP ~33 mmHg.  [name] TOTAL KNEE ARTHROPLASTY  05/09/2012   Procedure: TOTAL KNEE ARTHROPLASTY;  Surgeon: Kerin Salen, MD;  Location: [facility];  Service: Orthopedics;  Laterality: Right;  . TRANSTHORACIC ECHOCARDIOGRAM  06/2015   EF 60-65%. Mild LA dilation. Normal PA pressures: 32 mmHg. Aortic sclerosis but no stenosis.  [name] wisdom teeth extraccted      Allergies  Allergen Reactions  . Crab [Shellfish Allergy] Rash  . Hydrocodone Hives  . Adhesive [Tape] Rash  . Oxycodone Rash    Allergies as of 12/05/2019      Reactions   Crab [shellfish Allergy] Rash   Hydrocodone Hives   Adhesive [tape] Rash   Oxycodone Rash      Medication List       Accurate as of December 05, 2019  9:42 AM. If you have any questions, ask your nurse or doctor.        acetaminophen 500 MG tablet Commonly known as: TYLENOL Take 1,000 mg by mouth. Three times a day as needed   ALPRAZolam 0.25 MG tablet Commonly known as: XANAX Take 1 tablet (0.25 mg total) by mouth 2 (two) times daily as needed for anxiety. For 14 days   atorvastatin 40 MG tablet Commonly known as: LIPITOR TAKE 1 TABLET DAILY AT BEDTIME   barrier cream Crea Commonly known as: non-specified Apply 1 application topically. Zinc Oxide to buttocks area when assisting resident with toileting for redness every shift   Biofreeze 4 % Gel Generic drug: Menthol (Topical Analgesic) Apply topically in the morning, at noon, in the evening, and at bedtime.   diltiazem 240 MG 24 hr capsule Commonly known as: DILACOR XR Take 240 mg by mouth daily. For heart failure   Eliquis 5 MG Tabs tablet Generic drug: apixaban TAKE 1 TABLET TWICE A DAY (SCHEDULE FOLLOW UP WITH CARDIOLOGIST PRIOR TO NEXT REFILL REQUEST)   esomeprazole 40  MG capsule Commonly known as: NEXIUM TAKE 1 CAPSULE DAILY   ferrous sulfate 325 (65 FE) MG tablet Take 325 mg by mouth daily with breakfast.   furosemide 40 MG tablet Commonly known as: LASIX Take 40 mg by mouth daily.   losartan 100 MG tablet Commonly known as: COZAAR Take 50 mg by mouth daily.   magnesium oxide 400 MG tablet Commonly known as: MAG-OX Take 200 mg by mouth daily.   MIRALAX PO Take 17 g by mouth daily.   Myrbetriq 25 MG Tb24 tablet Generic drug: mirabegron ER Take 25 mg by mouth daily.   Nitrostat 0.4 MG SL tablet Generic drug: nitroGLYCERIN USE AS NEEDED   nystatin powder Commonly known as: MYCOSTATIN/NYSTOP Apply 1  application topically. Apply to back, R/L buttocks, groin, and under breasts Twice A Day   OXYGEN Inhale 2 L into the lungs continuous. For obstructive sleep apnea   PreviDent 5000 Booster Plus 1.1 % Pste Generic drug: Sodium Fluoride Place 1 application onto teeth at bedtime.   tamsulosin 0.4 MG Caps capsule Commonly known as: FLOMAX Take 0.4 mg by mouth daily.   Vitamin D3 25 MCG (1000 UT) Caps Take 1 capsule by mouth daily.       Review of Systems  Review of Systems  Constitutional: Negative for activity change, appetite change, chills, diaphoresis, fatigue and fever.  HENT: Negative for mouth sores, postnasal drip, rhinorrhea, sinus pain and sore throat.   Respiratory: Negative for apnea, cough, chest tightness, shortness of breath and wheezing.   Cardiovascular: Negative for chest pain, palpitations and leg swelling.  Gastrointestinal: Negative for abdominal distention, abdominal pain, constipation, diarrhea, nausea and vomiting.  Genitourinary: Negative for dysuria and frequency.  Musculoskeletal: Negative for arthralgias, joint swelling and myalgias.  Skin: Negative for rash.  Neurological: Negative for dizziness, syncope, weakness, light-headedness and numbness.  Psychiatric/Behavioral: Negative for behavioral  problems, confusion and sleep disturbance.     Immunization History  Administered Date(s) Administered  . H1N1 04/13/2010  . Influenza, High Dose Seasonal PF 02/12/2014, 03/29/2018, 01/05/2019  . Influenza-Unspecified 03/06/2013, 02/12/2014, 03/06/2017  . Moderna SARS-COVID-2 Vaccination 06/08/2019, 07/06/2019  . Pneumococcal Conjugate-13 02/12/2014  . Pneumococcal Polysaccharide-23 02/04/2013, 02/12/2014  . Td 09/04/2005  . Tdap 12/11/2012  . Zoster 07/07/2006   Pertinent  Health Maintenance Due  Topic Date Due  . INFLUENZA VACCINE  01/05/2020  . PNA vac Low Risk Adult  Completed   No flowsheet data found. Functional Status Survey:    Vitals:   12/05/19 0935  BP: 120/66  Pulse: 64  Resp: 20  Temp: (!) 97.5 F (36.4 C)  SpO2: 94%  Weight: 216 lb (98 kg)  Height: 5\' 7"  (1.702 m)   Body mass index is 33.83 kg/m. Physical Exam Vitals reviewed.  Constitutional:      Appearance: He is obese.  HENT:     Head: Normocephalic.     Nose: Nose normal.     Mouth/Throat:     Mouth: Mucous membranes are moist.     Pharynx: Oropharynx is clear.  Eyes:     Pupils: Pupils are equal, round, and reactive to light.  Cardiovascular:     Rate and Rhythm: Normal rate and regular rhythm.     Pulses: Normal pulses.  Pulmonary:     Effort: Pulmonary effort is normal. No respiratory distress.     Breath sounds: Normal breath sounds. No wheezing.  Abdominal:     General: Abdomen is flat. Bowel sounds are normal.     Palpations: Abdomen is soft.  Musculoskeletal:        General: Swelling present.     Cervical back: Neck supple.  Skin:    General: Skin is warm.  Neurological:     General: No focal deficit present.     Mental Status: He is alert and oriented to person, place, and time.  Psychiatric:        Mood and Affect: Mood normal.     Labs reviewed: Recent Labs    01/30/19 1700 03/25/19 0000 03/26/19 0000 04/30/19 0000 08/27/19 0000 10/02/19 0000  NA 138   < >  142 137  --  138  K 3.8   < > 4.0 4.2  --  3.7  CL 101   < >  103 99  --  100  CO2 26   < > 28* 29*  --  30*  GLUCOSE 143*  --   --   --   --   --   BUN 14   < > 17 20  --  13  CREATININE 0.95   < > 0.8 0.9  --  0.7  CALCIUM 9.0   < > 8.4* 8.5*  --  8.8  MG  --   --   --   --  1.5 1.5   < > = values in this interval not displayed.   Recent Labs    01/30/19 1700 01/30/19 1700 03/26/19 0000 04/30/19 0000 10/02/19 0000  AST 14*   < > 8* 9* 9*  ALT 9   < > 4* 4* 4*  ALKPHOS 77   < > 69 77 75  BILITOT 0.4  --   --   --   --   PROT 6.9  --   --   --   --   ALBUMIN 3.2*  --  3.5  --  3.3*   < > = values in this interval not displayed.   Recent Labs    01/30/19 1700 03/26/19 0000 04/03/19 0000 04/30/19 0000 10/02/19 0000  WBC 8.2   < > 9.4 9.1 8.9  NEUTROABS 6.2  --  7,417 6,224 6,070  HGB 9.9*   < > 9.6* 10.7* 12.9*  HCT 33.6*   < > 31* 34* 37*  MCV 83.8  --   --   --   --   PLT 309   < > 327 285 200   < > = values in this interval not displayed.   Lab Results  Component Value Date   TSH 2.58 10/02/2019   No results found for: HGBA1C Lab Results  Component Value Date   CHOL 109 10/02/2019   HDL 40 10/02/2019   LDLCALC 52 10/02/2019   TRIG 91 10/02/2019   CHOLHDL 2.9 02/12/2014    Significant Diagnostic Results in last 30 days:  No results found.  Assessment/Plan  Essential hypertension Stable on Cozaar and Cardizem  Persistent atrial fibrillation ([place]):  CHA2DS2-VASc Score 4; On Eliquis Cardizem Rate Controlled On Eliquis Diastolic congestive heart failure, unspecified HF chronicity (HCC) ON Lasix  BUN and Creat are stable Urinary incontinence, unspecified type On Myrbetriq and Flomax Iron deficiency anemia secondary to inadequate dietary iron intake Hgb Stable on Iron ILD (interstitial lung disease) (HCC) On Chronic Oxygen Follows with Pulmonary Anxiety PRN Xanax Obesity (BMI 30.0-34.9)  MCI (mild cognitive impairment) with memory  loss Supportive care Physical deconditioning Refuses Therapy Hyperlipidemia with target LDL less than 70 On Lipitor LDL at target CAD S/P percutaneous coronary angioplasty Continue on Statin OSA (obstructive sleep apnea) Refuses to use CPAP   Family/ staff Communication  Labs/tests ordered:

## 2019-12-20 IMAGING — CT CT CHEST W/ CM
2 of 3 series · 15 of 36 positions shown, 18 images · IV contrast (OMNIPAQUE 300)
Comparison: 11/23/2018

CLINICAL DATA: Follow-up lung nodule.

EXAM:
CT CHEST WITH CONTRAST
TECHNIQUE: Multidetector CT imaging of the chest was performed during
intravenous contrast administration.
CONTRAST:  80mL OMNIPAQUE IOHEXOL 300 MG/ML  SOLN

[Series 2: thorax · axial · 0.79mm/px · z∈[-303,-33]mm · 12 of 159 slices shown, 15 images]
[im 12/159  mediastinal]
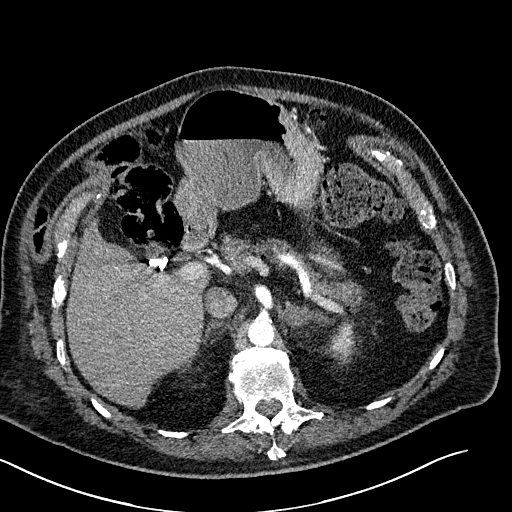
[im 12/159  lung]
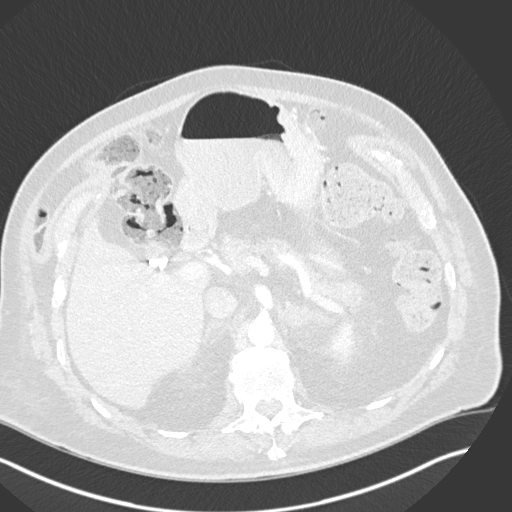
[im 24/159  lung]
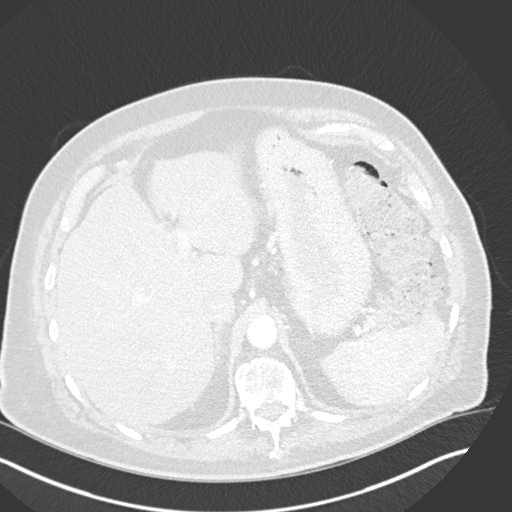
[im 36/159  lung]
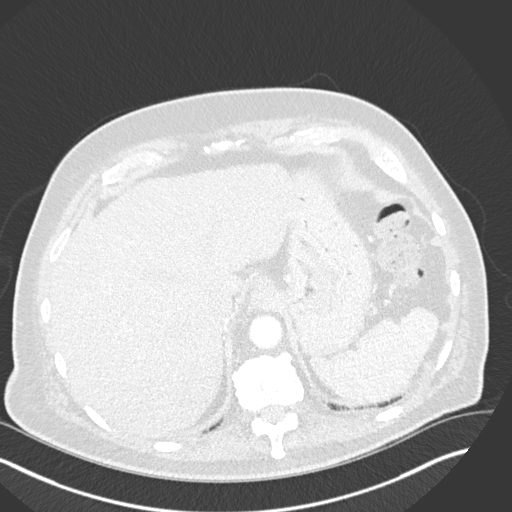
[im 47/159  lung]
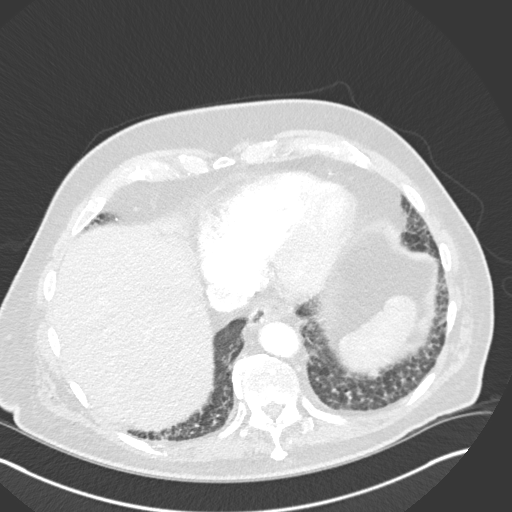
[im 59/159  mediastinal]
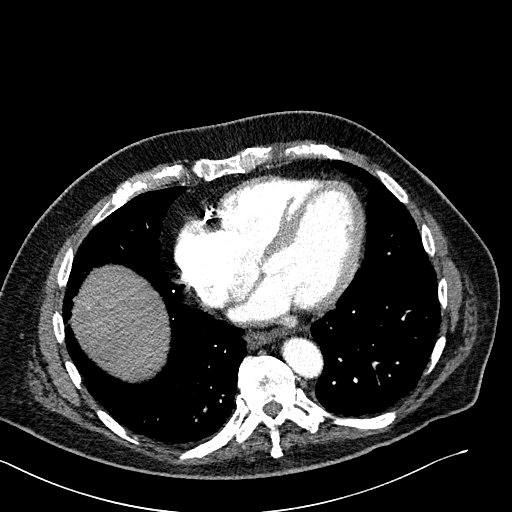
[im 59/159  lung]
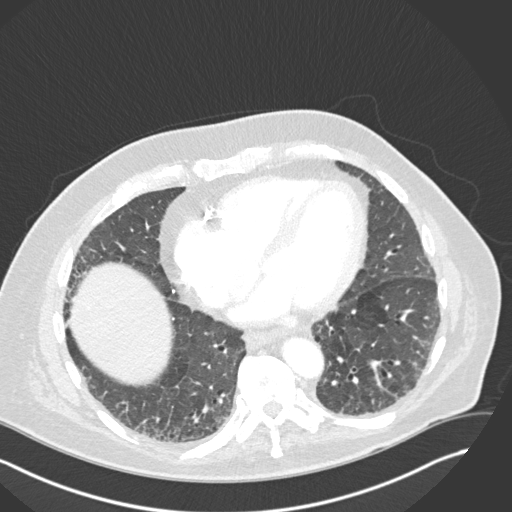
[im 71/159  lung]
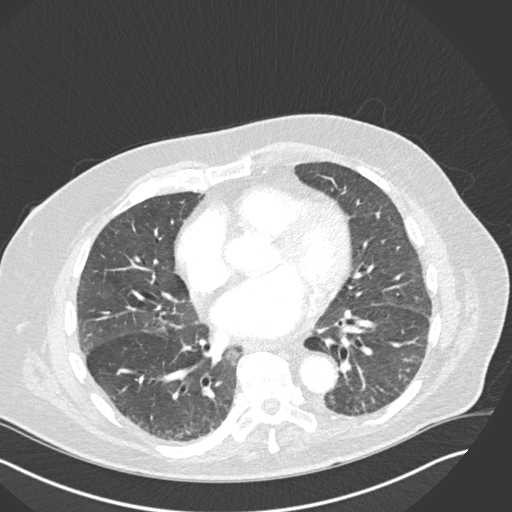
[im 88/159  lung]
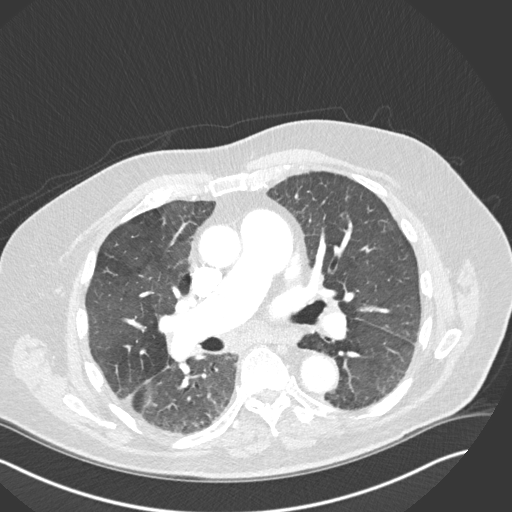
[im 100/159  lung]
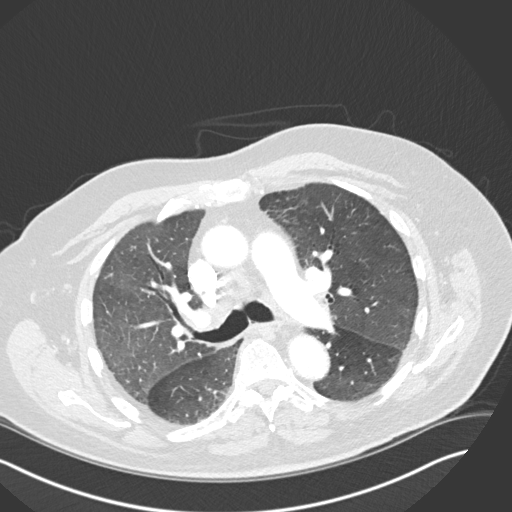
[im 112/159  mediastinal]
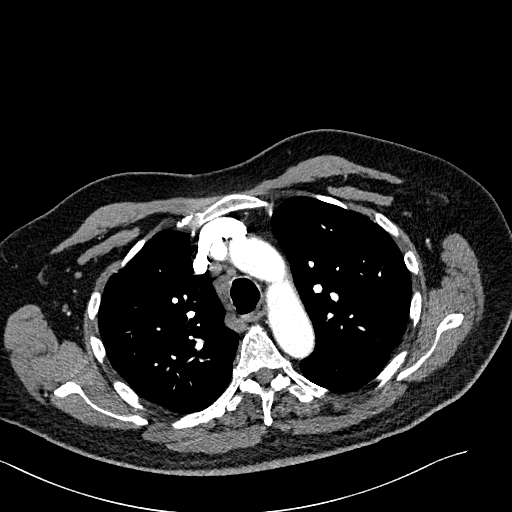
[im 112/159  lung]
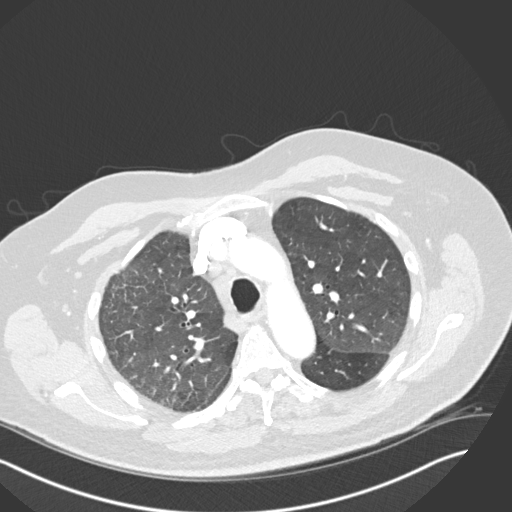
[im 123/159  lung]
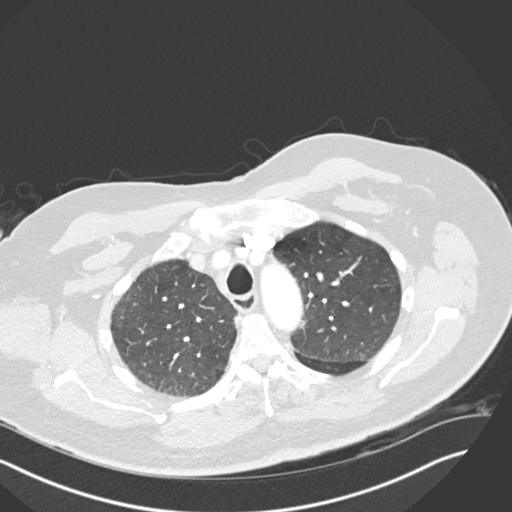
[im 135/159  lung]
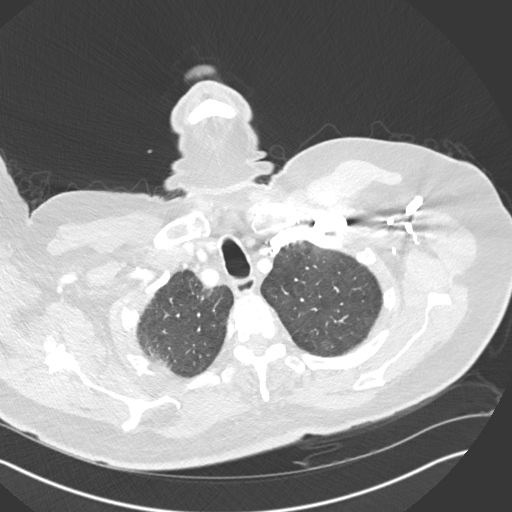
[im 147/159  lung]
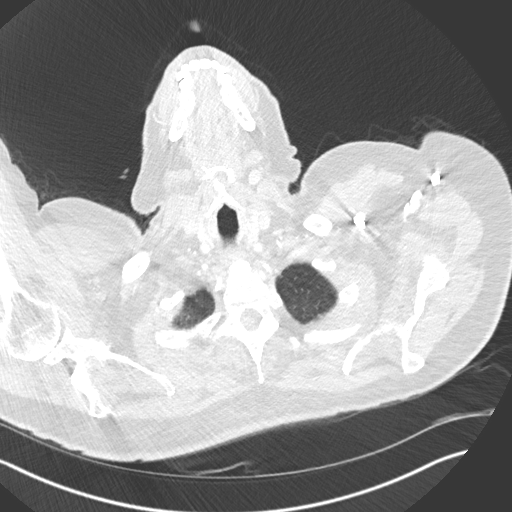

[Series 5: coronal · coronal · 0.62mm/px · 3 of 151 slices shown]
[im 31/151  lung]
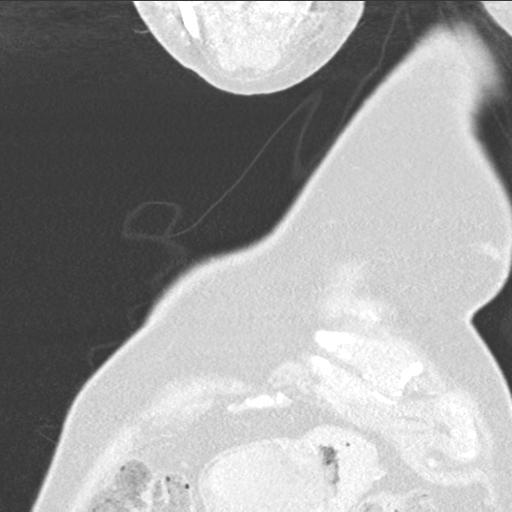
[im 61/151  lung]
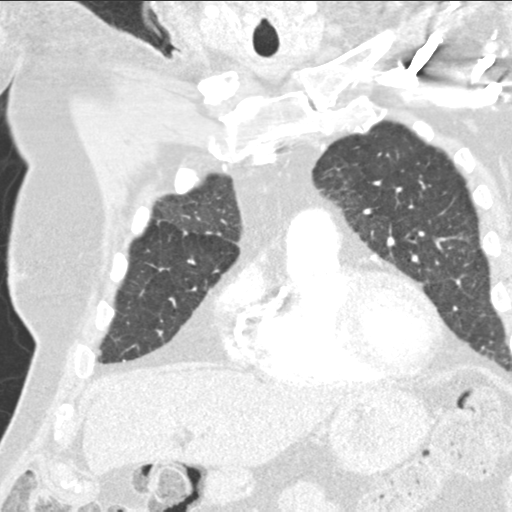
[im 91/151  lung]
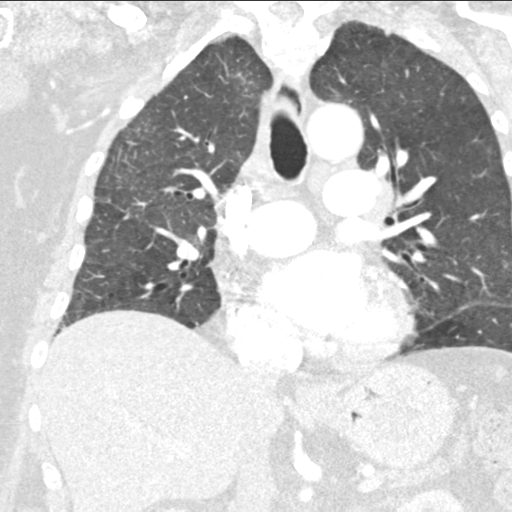

[15 of 36 positions shown; findings below may reference images not displayed]

FINDINGS: Cardiovascular: Normal heart size. No pericardial effusion. 4 cm
right ventricular outflow track is identified. Findings may reflect
PA hypertension. Aortic atherosclerosis. Lad, left circumflex and
RCA coronary artery calcifications.

Mediastinum/Nodes: Normal appearance of the thyroid gland. The
trachea appears patent and is midline. Normal appearance of the
esophagus. Prominent mediastinal lymph nodes are identified
including 1.6 cm right paratracheal lymph node, image 52/2.
Previously this measured the same. 1.5 cm subcarinal lymph node is
identified, image 72/2. Previously 1.3 cm. Enlarged right hilar node
measures 2 cm, image 64/2. This is difficult to compare with the
previous exam as the prior study was performed without IV contrast
material.

Lungs/Pleura: No pleural effusion identified. Diffuse bilateral
interstitial reticulation is identified which has a lower lung zone
predominance. Mild bronchiectasis. A mosaic attenuation pattern is
noted bilaterally. Right middle lobe lung nodule is unchanged
measuring 4 mm, image 93/3. right upper lobe lung nodule measuring 4
mm is stable from previous exam, image 46/3.

Upper Abdomen: Unchanged 1.5 cm low-density left adrenal nodule.
Previous cholecystectomy. Left upper pole kidney cysts noted.

Musculoskeletal: Spondylosis within the thoracic spine. Unchanged
lower thoracic spine compression fracture
IMPRESSION: 1. Unchanged right upper lobe and right middle lobe lung nodules. No
new or enlarging pulmonary nodules identified. No follow-up needed
if patient is low-risk (and has no known or suspected primary
neoplasm). Non-contrast chest CT can be considered at 12 months
(from 11/23/2018) if patient is high-risk. This recommendation
follows the consensus statement: Guidelines for Management of
Incidental Pulmonary Nodules Detected on CT Images: From the
2. Chronic interstitial lung disease with lower lung zone
predominance and mild bronchiectasis. As mentioned previously
findings may reflect nonspecific interstitial pneumonitis or early
usual interstitial pneumonitis.
3. Multi vessel coronary artery calcifications noted.
4. Stable enlarged mediastinal and right hilar lymph nodes.
5. Stable left adrenal nodule.
6. Stable lower thoracic spine compression fracture. Enlargement of
the right ventricular outflow tract may reflect underlying PA
hypertension.
7. Unchanged lower thoracic spine compression fracture.

Aortic Atherosclerosis ([id number]-LYR.[id number]).

## 2020-01-07 DIAGNOSIS — B351 Tinea unguium: Secondary | ICD-10-CM | POA: Diagnosis not present

## 2020-01-07 DIAGNOSIS — M79672 Pain in left foot: Secondary | ICD-10-CM | POA: Diagnosis not present

## 2020-01-07 DIAGNOSIS — M79671 Pain in right foot: Secondary | ICD-10-CM | POA: Diagnosis not present

## 2020-01-22 ENCOUNTER — Encounter: Payer: Self-pay | Admitting: Nurse Practitioner

## 2020-01-22 ENCOUNTER — Non-Acute Institutional Stay (SKILLED_NURSING_FACILITY): Payer: Medicare Other | Admitting: Nurse Practitioner

## 2020-01-22 DIAGNOSIS — K219 Gastro-esophageal reflux disease without esophagitis: Secondary | ICD-10-CM

## 2020-01-22 DIAGNOSIS — K5900 Constipation, unspecified: Secondary | ICD-10-CM

## 2020-01-22 DIAGNOSIS — R32 Unspecified urinary incontinence: Secondary | ICD-10-CM | POA: Diagnosis not present

## 2020-01-22 DIAGNOSIS — I10 Essential (primary) hypertension: Secondary | ICD-10-CM

## 2020-01-22 DIAGNOSIS — J849 Interstitial pulmonary disease, unspecified: Secondary | ICD-10-CM | POA: Diagnosis not present

## 2020-01-22 DIAGNOSIS — D508 Other iron deficiency anemias: Secondary | ICD-10-CM | POA: Diagnosis not present

## 2020-01-22 DIAGNOSIS — I4819 Other persistent atrial fibrillation: Secondary | ICD-10-CM

## 2020-01-22 DIAGNOSIS — F419 Anxiety disorder, unspecified: Secondary | ICD-10-CM

## 2020-01-22 DIAGNOSIS — M25562 Pain in left knee: Secondary | ICD-10-CM | POA: Diagnosis not present

## 2020-01-22 DIAGNOSIS — G8929 Other chronic pain: Secondary | ICD-10-CM

## 2020-01-22 DIAGNOSIS — I5030 Unspecified diastolic (congestive) heart failure: Secondary | ICD-10-CM | POA: Diagnosis not present

## 2020-01-22 NOTE — Assessment & Plan Note (Signed)
uses mechanical lift for transfer, takes prn Tylenol. 

## 2020-01-22 NOTE — Assessment & Plan Note (Signed)
no urinary retention, on Mirabegron 25mg  qd, Tamsulosin 0.4mg  qd.

## 2020-01-22 NOTE — Assessment & Plan Note (Signed)
Stable, continue Esomeprazole.

## 2020-01-22 NOTE — Assessment & Plan Note (Signed)
heart rate is in control, on Diltiazem 240mg qd, Eliquis 5mg bid. 

## 2020-01-22 NOTE — Progress Notes (Signed)
Location:    [place] Room Number: 5Room Number 5 Place of Service:  SNF (31)SNF Provider: Lennie Odor Yannis Gumbs NP  Virgie Dad, MD  Patient Care Team: Virgie Dad, MD as PCP - General (Internal Medicine) Leonie Shneur [place], MD as PCP - Cardiology (Cardiology)  Extended Emergency Contact Information Primary Emergency Contact: [facility] Address: [address]          [place], [place] [address] [name] of [place] Phone: [phone] Mobile Phone: [phone] Relation: Spouse Secondary Emergency Contact: Janey Genta Address: [address]          [place], [place] Phone: [phone] Mobile Phone: [phone] Relation: Daughter  Code Status:  DNR Goals of care: Advanced Directive information Advanced Directives 01/22/2020  Does Patient Have a Medical Advance Directive? Yes  Type of Paramedic of [place];Living will  Does patient want to make changes to medical advance directive? No - Patient declined  Copy of [place] in Chart? Yes - validated most recent copy scanned in chart (See row information)  Would patient like information on creating a medical advance directive? -  Pre-existing out of facility DNR order (yellow form or pink MOST form) -     Chief Complaint  Patient presents with  . Medical Management of Chronic Issues    Routine follow up visit  . Best Practice Recommendations    Flu vaccine    HPI:  Pt is a 86 y.o. male seen today for medical management of chronic diseases.      Hx of OA, uses mechanical lift for transfer, takes prn Tylenol.   Hx of Interstitial lung disease, O2 dependent,  CHF/minimal edema BLE, on Furosamide 40mg  qd.  His mood is stable, on Alprazolam 0.25mg  bid prn.  Afib, heart rate is in control, on Diltiazem 240mg  qd, Eliquis 5mg  bid.   GERD, stable, on Esomeprazole 40mg  qd.   Anemia, stable, on Fe.   HTN, blood  pressure is controlled on Losartan 50mg  qd, Diltiazem 240mg  qd, Furosemide 40mg  qd.   Urinary incontinent, no urinary retention, on Mirabegron 25mg  qd, Tamsulosin 0.4mg  qd.   Constipation, stable, on MiraLax qd.    Past Medical History:  Diagnosis Date  . Anxiety   . CAD S/P percutaneous coronary angioplasty 1996, 2009, 01/2010   PTCA of L Cx 1996; BMS-RCA [facility]) 2009; Staged PCI RCA (70% ISR) --> 3.0 mm x 23 mm BMS, staged PCI Diag - 2.0 mm x 12 mm Mini Vision BMS (2.25 mm)  . Cataracts, bilateral    immature  . Chronic back pain    scoliosis--pt states unable to have surgery bc of age  . GERD (gastroesophageal reflux disease)    takes Nexium daily  . History of colon polyps   . Hyperlipidemia   . Hypertension   . Myocardial infarction [facility]) 1996, 2009   x 2;last time was about 8-54yrs ago   . Obesity (BMI 30.0-34.9)   . OSA (obstructive sleep apnea)   . Osteoarthritis of both knees   . Peripheral edema    Venous Stasis  . Persistent atrial fibrillation (HCC)    Unable to maintaine NSR after DCCV with Tikosyn.  Plan = rate control w/diltiazem (beta blocker off b/c fatigue). CHA2DS2Vasc 4. DOAC - Eliquis.   . Prostate cancer ([place]) 2005   seed implant   Past Surgical History:  Procedure Laterality Date  . CARDIOVERSION N/A 06/12/2015   Procedure: CARDIOVERSION;  Surgeon:  Sanda Klein, MD;  Location: [place];  Service: Cardiovascular;  Laterality: N/A;  . CARDIOVERSION N/A 08/02/2015   Procedure: CARDIOVERSION;  Surgeon: [name], MD;  Location: [facility];  Service: Cardiovascular;  Laterality: N/A;  . CHOLECYSTECTOMY    . COLONOSCOPY    . CORONARY ANGIOPLASTY WITH STENT PLACEMENT  1996, 2009, August 2011   PTCA of L Cx 1996; BMS-RCA [facility]) 2009; Staged PCI RCA (70% ISR) --> 3.0 mm x 23 mm BMS, staged PCI Diag - 2.0 mm x 12 mm Mini Vision BMS (2.25 mm)  . cyst removed  in college  . NEUROPLASTY / TRANSPOSITION MEDIAN NERVE AT CARPAL TUNNEL BILATERAL    . NM MYOVIEW LTD   November 2013   EF 53%; fixed mid inferior-inferolateral defect, infarct with no ischemia.  . right knee arthrsoscopy  14yrs ago  . right trigger finger    . seeds placed into prostate   2005  . TEE WITHOUT CARDIOVERSION N/A 06/12/2015   Procedure: TRANSESOPHAGEAL ECHOCARDIOGRAM (TEE) - WITH CARDIOVERSION;  Surgeon: Sanda Klein, MD;  Location: [place];  Service: Cardiovascular: EF 55-60%.No LA or LAA thrombus. Normal Peal PAP ~33 mmHg.  [name] TOTAL KNEE ARTHROPLASTY  05/09/2012   Procedure: TOTAL KNEE ARTHROPLASTY;  Surgeon: Kerin Salen, MD;  Location: [place];  Service: Orthopedics;  Laterality: Right;  . TRANSTHORACIC ECHOCARDIOGRAM  06/2015   EF 60-65%. Mild LA dilation. Normal PA pressures: 32 mmHg. Aortic sclerosis but no stenosis.  [name] wisdom teeth extraccted      Allergies  Allergen Reactions  . Crab [Shellfish Allergy] Rash  . Hydrocodone Hives  . Adhesive [Tape] Rash  . Oxycodone Rash    Allergies as of 01/22/2020      Reactions   Crab [shellfish Allergy] Rash   Hydrocodone Hives   Adhesive [tape] Rash   Oxycodone Rash      Medication List       Accurate as of January 22, 2020 11:59 PM. If you have any questions, ask your nurse or doctor.        STOP taking these medications   ALPRAZolam 0.25 MG tablet Commonly known as: XANAX Stopped by: Natashia Roseman X Coleton Woon, NP     TAKE these medications   acetaminophen 500 MG tablet Commonly known as: TYLENOL Take 1,000 mg by mouth. Three times a day as needed   atorvastatin 40 MG tablet Commonly known as: LIPITOR TAKE 1 TABLET DAILY AT BEDTIME   barrier cream Crea Commonly known as: non-specified Apply 1 application topically. Zinc Oxide to buttocks area when assisting resident with toileting for redness every shift   Biofreeze 4 % Gel Generic drug: Menthol (Topical Analgesic) Apply topically in the morning, at noon, in the evening, and at bedtime.   diltiazem 240 MG 24 hr capsule Commonly known as: DILACOR XR Take 240 mg  by mouth daily. For heart failure   Eliquis 5 MG Tabs tablet Generic drug: apixaban TAKE 1 TABLET TWICE A DAY (SCHEDULE FOLLOW UP WITH CARDIOLOGIST PRIOR TO NEXT REFILL REQUEST)   esomeprazole 40 MG capsule Commonly known as: NEXIUM TAKE 1 CAPSULE DAILY   ferrous sulfate 325 (65 FE) MG tablet Take 325 mg by mouth daily with breakfast.   furosemide 40 MG tablet Commonly known as: LASIX Take 40 mg by mouth daily.   losartan 50 MG tablet Commonly known as: COZAAR Take 50 mg by mouth daily.   magnesium oxide 400 MG tablet Commonly known as: MAG-OX Take 200 mg by mouth daily. 1/2 tablet  MIRALAX PO Take 17 g by mouth daily.   Myrbetriq 25 MG Tb24 tablet Generic drug: mirabegron ER Take 25 mg by mouth daily.   Nitrostat 0.4 MG SL tablet Generic drug: nitroGLYCERIN USE AS NEEDED   nystatin powder Commonly known as: MYCOSTATIN/NYSTOP Apply 1 application topically. Apply to back, R/L buttocks, groin, and under breasts Twice A Day   OXYGEN Inhale 2 L into the lungs continuous. For obstructive sleep apnea   PreviDent 5000 Booster Plus 1.1 % Pste Generic drug: Sodium Fluoride Place 1 application onto teeth at bedtime.   tamsulosin 0.4 MG Caps capsule Commonly known as: FLOMAX Take 0.4 mg by mouth daily.   Vitamin D3 25 MCG (1000 UT) Caps Take 1 capsule by mouth daily.       Review of Systems  Constitutional: Negative for fatigue, fever and unexpected weight change.  HENT: Positive for hearing loss. Negative for congestion and voice change.   Eyes: Negative for visual disturbance.  Respiratory: Positive for shortness of breath. Negative for cough and wheezing.        O2 dependent.   Cardiovascular: Positive for leg swelling.  Gastrointestinal: Negative for abdominal pain and constipation.       Incontinent of feces.   Genitourinary: Negative for difficulty urinating, dysuria and urgency.       Incontinent of urine.   Musculoskeletal: Positive for arthralgias  and gait problem.       Left knee pain is chronic. The right great toe pain at the base of the toe nail, but no warmth, redness, or swelling.   Skin: Negative for color change.  Neurological: Negative for speech difficulty, weakness and light-headedness.       Memory lapses.   Psychiatric/Behavioral: Negative for behavioral problems and sleep disturbance. The patient is not nervous/anxious.     Immunization History  Administered Date(s) Administered  . H1N1 04/13/2010  . Influenza, High Dose Seasonal PF 02/12/2014, 03/29/2018, 01/05/2019  . Influenza-Unspecified 03/06/2013, 02/12/2014, 03/06/2017  . Moderna SARS-COVID-2 Vaccination 06/08/2019, 07/06/2019  . Pneumococcal Conjugate-13 02/12/2014  . Pneumococcal Polysaccharide-23 02/04/2013, 02/12/2014  . Td 09/04/2005  . Tdap 12/11/2012  . Zoster 07/07/2006   Pertinent  Health Maintenance Due  Topic Date Due  . INFLUENZA VACCINE  01/05/2020  . PNA vac Low Risk Adult  Completed   No flowsheet data found. Functional Status Survey:    Vitals:   01/22/20 1252  BP: 120/62  Pulse: 64  Resp: 18  Temp: 97.6 F (36.4 C)  SpO2: 94%  Weight: 214 lb (97.1 kg)  Height: 5\' 7"  (1.702 m)   Body mass index is 33.52 kg/m. Physical Exam Vitals and nursing note reviewed.  Constitutional:      Appearance: Normal appearance. He is obese.  HENT:     Head: Normocephalic and atraumatic.     Mouth/Throat:     Mouth: Mucous membranes are moist.  Eyes:     Extraocular Movements: Extraocular movements intact.     Conjunctiva/sclera: Conjunctivae normal.     Pupils: Pupils are equal, round, and reactive to light.  Cardiovascular:     Rate and Rhythm: Normal rate. Rhythm irregular.     Heart sounds: No murmur heard.      Comments: DP pulses not felt, BLE are sensitive to touch, no open wound Pulmonary:     Breath sounds: No wheezing.     Comments: O2 dependent.  Abdominal:     Palpations: Abdomen is soft.     Tenderness: There is no  abdominal  tenderness.  Musculoskeletal:     Cervical back: Normal range of motion and neck supple.     Right lower leg: Edema present.     Left lower leg: Edema present.     Comments: Trace edema BLE. Mechanical lift for transfer.   Skin:    General: Skin is warm and dry.  Neurological:     General: No focal deficit present.     Mental Status: He is alert and oriented to person, place, and time. Mental status is at baseline.     Gait: Gait abnormal.  Psychiatric:        Mood and Affect: Mood normal.        Behavior: Behavior normal.        Thought Content: Thought content normal.        Judgment: Judgment normal.     Labs reviewed: Recent Labs    01/30/19 1700 03/25/19 0000 03/26/19 0000 04/30/19 0000 08/27/19 0000 10/02/19 0000  NA 138   < > 142 137  --  138  K 3.8   < > 4.0 4.2  --  3.7  CL 101   < > 103 99  --  100  CO2 26   < > 28* 29*  --  30*  GLUCOSE 143*  --   --   --   --   --   BUN 14   < > 17 20  --  13  CREATININE 0.95   < > 0.8 0.9  --  0.7  CALCIUM 9.0   < > 8.4* 8.5*  --  8.8  MG  --   --   --   --  1.5 1.5   < > = values in this interval not displayed.   Recent Labs    01/30/19 1700 01/30/19 1700 03/26/19 0000 04/30/19 0000 10/02/19 0000  AST 14*   < > 8* 9* 9*  ALT 9   < > 4* 4* 4*  ALKPHOS 77   < > 69 77 75  BILITOT 0.4  --   --   --   --   PROT 6.9  --   --   --   --   ALBUMIN 3.2*  --  3.5  --  3.3*   < > = values in this interval not displayed.   Recent Labs    01/30/19 1700 03/26/19 0000 04/03/19 0000 04/30/19 0000 10/02/19 0000  WBC 8.2   < > 9.4 9.1 8.9  NEUTROABS 6.2  --  7,417 6,224 6,070  HGB 9.9*   < > 9.6* 10.7* 12.9*  HCT 33.6*   < > 31* 34* 37*  MCV 83.8  --   --   --   --   PLT 309   < > 327 285 200   < > = values in this interval not displayed.   Lab Results  Component Value Date   TSH 2.58 10/02/2019   No results found for: HGBA1C Lab Results  Component Value Date   CHOL 109 10/02/2019   HDL 40 10/02/2019     LDLCALC 52 10/02/2019   TRIG 91 10/02/2019   CHOLHDL 2.9 02/12/2014    Significant Diagnostic Results in last 30 days:  No results found.  Assessment/Plan  Diastolic CHF (HCC) minimal edema BLE, on Furosamide 40mg  qd.   Essential hypertension blood pressure is controlled on Losartan 50mg  qd, Diltiazem 240mg  qd, Furosemide 40mg  qd.    Persistent atrial fibrillation ([place]):  CHA2DS2-VASc  Score 4; On Eliquis  heart rate is in control, on Diltiazem 240mg  qd, Eliquis 5mg  bid.    ILD (interstitial lung disease) (HCC) Hx of Interstitial lung disease, O2 dependent,  GERD (gastroesophageal reflux disease) Stable, continue Esomeprazole.   Constipation Stable, continue MiraLax.   Iron deficiency anemia Stable, decrease Fe 3x/day, repeat CBC in one month.   Left knee pain  uses mechanical lift for transfer, takes prn Tylenol.    Urinary incontinence no urinary retention, on Mirabegron 25mg  qd, Tamsulosin 0.4mg  qd.   Anxiety His mood is stable, continue Alprazolam   Family/ staff Communication: plan of care reviewed with the patient and charge nurse.   Labs/tests ordered: CBC one month  Time spend 35 minutes.

## 2020-01-22 NOTE — Assessment & Plan Note (Signed)
blood pressure is controlled on Losartan 50mg  qd, Diltiazem 240mg  qd, Furosemide 40mg  qd.

## 2020-01-22 NOTE — Assessment & Plan Note (Signed)
minimal edema BLE, on Furosamide 40mg  qd.

## 2020-01-22 NOTE — Assessment & Plan Note (Signed)
Stable, continue MiraLax.  °

## 2020-01-22 NOTE — Assessment & Plan Note (Addendum)
Stable, decrease Fe 3x/day, repeat CBC in one month.

## 2020-01-22 NOTE — Assessment & Plan Note (Signed)
Hx of Interstitial lung disease, O2 dependent,

## 2020-01-22 NOTE — Assessment & Plan Note (Signed)
His mood is stable, continue Alprazolam

## 2020-01-23 ENCOUNTER — Encounter: Payer: Self-pay | Admitting: Nurse Practitioner

## 2020-02-12 ENCOUNTER — Encounter: Payer: Self-pay | Admitting: Nurse Practitioner

## 2020-02-12 ENCOUNTER — Non-Acute Institutional Stay (SKILLED_NURSING_FACILITY): Payer: Medicare Other | Admitting: Nurse Practitioner

## 2020-02-12 DIAGNOSIS — J849 Interstitial pulmonary disease, unspecified: Secondary | ICD-10-CM

## 2020-02-12 DIAGNOSIS — I4819 Other persistent atrial fibrillation: Secondary | ICD-10-CM | POA: Diagnosis not present

## 2020-02-12 DIAGNOSIS — I5030 Unspecified diastolic (congestive) heart failure: Secondary | ICD-10-CM | POA: Diagnosis not present

## 2020-02-12 DIAGNOSIS — K219 Gastro-esophageal reflux disease without esophagitis: Secondary | ICD-10-CM

## 2020-02-12 DIAGNOSIS — R32 Unspecified urinary incontinence: Secondary | ICD-10-CM | POA: Diagnosis not present

## 2020-02-12 DIAGNOSIS — K5900 Constipation, unspecified: Secondary | ICD-10-CM | POA: Diagnosis not present

## 2020-02-12 DIAGNOSIS — D508 Other iron deficiency anemias: Secondary | ICD-10-CM | POA: Diagnosis not present

## 2020-02-12 DIAGNOSIS — M1711 Unilateral primary osteoarthritis, right knee: Secondary | ICD-10-CM

## 2020-02-12 DIAGNOSIS — K130 Diseases of lips: Secondary | ICD-10-CM | POA: Diagnosis not present

## 2020-02-12 DIAGNOSIS — F419 Anxiety disorder, unspecified: Secondary | ICD-10-CM | POA: Diagnosis not present

## 2020-02-12 NOTE — Assessment & Plan Note (Signed)
no urinary retention, on Mirabegron 25mg qd, Tamsulosin 0.4mg qd. 

## 2020-02-12 NOTE — Assessment & Plan Note (Signed)
OA, uses mechanical lift for transfer, takes prn Tylenol.   

## 2020-02-12 NOTE — Assessment & Plan Note (Signed)
Stable, continue Alprazolam.

## 2020-02-12 NOTE — Assessment & Plan Note (Signed)
Stable, continue MiraLax.  °

## 2020-02-12 NOTE — Progress Notes (Signed)
Location:   [place] Room Number: 5 Place of Service:  SNF (31) Provider:  Marlana Latus, NP  Virgie Dad, MD  Patient Care Team: Virgie Dad, MD as PCP - General (Internal Medicine) Leonie Renner Sebald, MD as PCP - Cardiology (Cardiology)  Extended Emergency Contact Information Primary Emergency Contact: [facility] Address: [address]          [place], [place] [address] [name] of [place] Phone: [phone] Mobile Phone: [phone] Relation: Spouse Secondary Emergency Contact: Janey Genta Address: [place], [place] Phone: [phone] Mobile Phone: [phone] Relation: Daughter  Code Status:  Full Code Goals of care: Advanced Directive information Advanced Directives 02/12/2020  Does Patient Have a Medical Advance Directive? Yes  Type of Paramedic of [place];Living will  Does patient want to make changes to medical advance directive? No - Patient declined  Copy of [place] in Chart? Yes - validated most recent copy scanned in chart (See row information)  Would patient like information on creating a medical advance directive? -  Pre-existing out of facility DNR order (yellow form or pink MOST form) -     Chief Complaint  Patient presents with  . Medical Management of Chronic Issues    Routine follow up visit  . Best Practice Recommendations    Flu vaccine    HPI:  Pt is a 86 y.o. male seen today for medical management of chronic diseases.    Urinary incontinent, no urinary retention, on Mirabegron 25mg  qd, Tamsulosin 0.4mg  qd.             Constipation, stable, on MiraLax qd.  OA, uses mechanical lift for transfer, takes prn Tylenol.              Hx of Interstitial lung disease, O2 dependent      CHF/minimal edema BLE, on Furosamide 40mg  qd.              Anxiety, on Alprazolam 0.25mg  bid prn.    Afib, heart rate is  in control, on Diltiazem 240mg  qd, Eliquis 5mg  bid.              GERD, stable, on Esomeprazole 40mg  qd.              Anemia, stable, on Fe.              HTN, blood pressure is controlled on Losartan 50mg  qd, Diltiazem 240mg  qd, Furosemide 40mg  qd.                Past Medical History:  Diagnosis Date  . Anxiety   . CAD S/P percutaneous coronary angioplasty 1996, 2009, 01/2010   PTCA of L Cx 1996; BMS-RCA [facility]) 2009; Staged PCI RCA (70% ISR) --> 3.0 mm x 23 mm BMS, staged PCI Diag - 2.0 mm x 12 mm Mini Vision BMS (2.25 mm)  . Cataracts, bilateral    immature  . Chronic back pain    scoliosis--pt states unable to have surgery bc of age  . GERD (gastroesophageal reflux disease)    takes Nexium daily  . History of colon polyps   . Hyperlipidemia   . Hypertension   . Myocardial infarction [facility]) 1996, 2009   x 2;last time was about 8-62yrs ago   . Obesity (BMI 30.0-34.9)   . OSA (obstructive sleep apnea)   . Osteoarthritis of both  knees   . Peripheral edema    Venous Stasis  . Persistent atrial fibrillation (HCC)    Unable to maintaine NSR after DCCV with Tikosyn.  Plan = rate control w/diltiazem (beta blocker off b/c fatigue). CHA2DS2Vasc 4. DOAC - Eliquis.   . Prostate cancer ([place]) 2005   seed implant   Past Surgical History:  Procedure Laterality Date  . CARDIOVERSION N/A 06/12/2015   Procedure: CARDIOVERSION;  Surgeon: Sanda Klein, MD;  Location: [facility];  Service: Cardiovascular;  Laterality: N/A;  . CARDIOVERSION N/A 08/02/2015   Procedure: CARDIOVERSION;  Surgeon: [name], MD;  Location: [facility];  Service: Cardiovascular;  Laterality: N/A;  . CHOLECYSTECTOMY    . COLONOSCOPY    . CORONARY ANGIOPLASTY WITH STENT PLACEMENT  1996, 2009, August 2011   PTCA of L Cx 1996; BMS-RCA [facility]) 2009; Staged PCI RCA (70% ISR) --> 3.0 mm x 23 mm BMS, staged PCI Diag - 2.0 mm x 12 mm Mini Vision BMS (2.25 mm)  . cyst removed  in college  . NEUROPLASTY / TRANSPOSITION MEDIAN  NERVE AT CARPAL TUNNEL BILATERAL    . NM MYOVIEW LTD  November 2013   EF 53%; fixed mid inferior-inferolateral defect, infarct with no ischemia.  . right knee arthrsoscopy  53yrs ago  . right trigger finger    . seeds placed into prostate   2005  . TEE WITHOUT CARDIOVERSION N/A 06/12/2015   Procedure: TRANSESOPHAGEAL ECHOCARDIOGRAM (TEE) - WITH CARDIOVERSION;  Surgeon: Sanda Klein, MD;  Location: [facility];  Service: Cardiovascular: EF 55-60%.No LA or LAA thrombus. Normal Peal PAP ~33 mmHg.  [name] TOTAL KNEE ARTHROPLASTY  05/09/2012   Procedure: TOTAL KNEE ARTHROPLASTY;  Surgeon: Kerin Salen, MD;  Location: [facility];  Service: Orthopedics;  Laterality: Right;  . TRANSTHORACIC ECHOCARDIOGRAM  06/2015   EF 60-65%. Mild LA dilation. Normal PA pressures: 32 mmHg. Aortic sclerosis but no stenosis.  [name] wisdom teeth extraccted      Allergies  Allergen Reactions  . Crab [Shellfish Allergy] Rash  . Hydrocodone Hives  . Adhesive [Tape] Rash  . Oxycodone Rash    Allergies as of 02/12/2020      Reactions   Crab [shellfish Allergy] Rash   Hydrocodone Hives   Adhesive [tape] Rash   Oxycodone Rash      Medication List       Accurate as of February 12, 2020 11:59 PM. If you have any questions, ask your nurse or doctor.        acetaminophen 500 MG tablet Commonly known as: TYLENOL Take 1,000 mg by mouth. Three times a day as needed   atorvastatin 40 MG tablet Commonly known as: LIPITOR TAKE 1 TABLET DAILY AT BEDTIME   barrier cream Crea Commonly known as: non-specified Apply 1 application topically. Zinc Oxide to buttocks area when assisting resident with toileting for redness every shift   Biofreeze 4 % Gel Generic drug: Menthol (Topical Analgesic) Apply topically in the morning, at noon, in the evening, and at bedtime.   diltiazem 240 MG 24 hr capsule Commonly known as: DILACOR XR Take 240 mg by mouth daily. For heart failure   Eliquis 5 MG Tabs tablet Generic drug:  apixaban TAKE 1 TABLET TWICE A DAY (SCHEDULE FOLLOW UP WITH CARDIOLOGIST PRIOR TO NEXT REFILL REQUEST)   esomeprazole 40 MG capsule Commonly known as: NEXIUM TAKE 1 CAPSULE DAILY   ferrous sulfate 325 (65 FE) MG tablet Take 325 mg by mouth daily with breakfast. Mon, Wed, Fri  furosemide 40 MG tablet Commonly known as: LASIX Take 40 mg by mouth daily.   losartan 50 MG tablet Commonly known as: COZAAR Take 50 mg by mouth daily.   magnesium oxide 400 MG tablet Commonly known as: MAG-OX Take 200 mg by mouth daily. 1/2 tablet   MIRALAX PO Take 17 g by mouth daily.   Myrbetriq 25 MG Tb24 tablet Generic drug: mirabegron ER Take 25 mg by mouth daily.   Nitrostat 0.4 MG SL tablet Generic drug: nitroGLYCERIN USE AS NEEDED   nystatin powder Commonly known as: MYCOSTATIN/NYSTOP Apply 1 application topically. Apply to back, R/L buttocks, groin, and under breasts Twice A Day   OXYGEN Inhale 2 L into the lungs continuous. For obstructive sleep apnea   PreviDent 5000 Booster Plus 1.1 % Pste Generic drug: Sodium Fluoride Place 1 application onto teeth at bedtime.   tamsulosin 0.4 MG Caps capsule Commonly known as: FLOMAX Take 0.4 mg by mouth daily.   Vitamin D3 25 MCG (1000 UT) Caps Take 1 capsule by mouth daily.       Review of Systems  Constitutional: Negative for activity change, fever and unexpected weight change.  HENT: Positive for hearing loss. Negative for congestion and voice change.   Eyes: Negative for visual disturbance.  Respiratory: Positive for shortness of breath. Negative for cough and wheezing.        O2 dependent. DOE  Cardiovascular: Positive for leg swelling.  Gastrointestinal: Negative for abdominal pain and constipation.       Incontinent of feces.   Genitourinary: Negative for difficulty urinating, dysuria and urgency.       Incontinent of urine.   Musculoskeletal: Positive for arthralgias and gait problem.       Left knee pain is chronic.  The right great toe pain at the base of the toe nail, but no warmth, redness, or swelling.   Skin: Positive for rash. Negative for pallor.       face  Neurological: Negative for dizziness, speech difficulty, weakness and headaches.       Memory lapses.   Psychiatric/Behavioral: Negative for behavioral problems and sleep disturbance. The patient is not nervous/anxious.     Immunization History  Administered Date(s) Administered  . H1N1 04/13/2010  . Influenza, High Dose Seasonal PF 02/12/2014, 03/29/2018, 01/05/2019  . Influenza-Unspecified 03/06/2013, 02/12/2014, 03/06/2017  . Moderna SARS-COVID-2 Vaccination 06/08/2019, 07/06/2019  . Pneumococcal Conjugate-13 02/12/2014  . Pneumococcal Polysaccharide-23 02/04/2013, 02/12/2014  . Td 09/04/2005  . Tdap 12/11/2012  . Zoster 07/07/2006   Pertinent  Health Maintenance Due  Topic Date Due  . INFLUENZA VACCINE  01/05/2020  . PNA vac Low Risk Adult  Completed   No flowsheet data found. Functional Status Survey:    Vitals:   02/12/20 0907  BP: 136/60  Pulse: 60  Resp: 18  Temp: (!) 97.4 F (36.3 C)  SpO2: 96%  Weight: 216 lb (98 kg)  Height: 5\' 7"  (1.702 m)   Body mass index is 33.83 kg/m. Physical Exam Vitals and nursing note reviewed.  Constitutional:      Appearance: Normal appearance. He is obese.  HENT:     Head: Normocephalic and atraumatic.     Mouth/Throat:     Mouth: Mucous membranes are moist.  Eyes:     Extraocular Movements: Extraocular movements intact.     Conjunctiva/sclera: Conjunctivae normal.     Pupils: Pupils are equal, round, and reactive to light.  Cardiovascular:     Rate and Rhythm: Normal rate. Rhythm irregular.  Heart sounds: No murmur heard.      Comments: DP pulses not felt prior Pulmonary:     Breath sounds: No wheezing.     Comments: O2 dependent.  Abdominal:     General: Bowel sounds are normal.     Palpations: Abdomen is soft.     Tenderness: There is no abdominal tenderness.   Musculoskeletal:     Cervical back: Normal range of motion and neck supple.     Right lower leg: Edema present.     Left lower leg: Edema present.     Comments: Trace edema BLE. Mechanical lift for transfer.   Skin:    General: Skin is warm and dry.     Comments: Angular cheilitis.  Neurological:     General: No focal deficit present.     Mental Status: He is alert and oriented to person, place, and time. Mental status is at baseline.     Gait: Gait abnormal.  Psychiatric:        Mood and Affect: Mood normal.        Behavior: Behavior normal.        Thought Content: Thought content normal.        Judgment: Judgment normal.     Labs reviewed: Recent Labs    03/26/19 0000 04/30/19 0000 08/27/19 0000 10/02/19 0000  NA 142 137  --  138  K 4.0 4.2  --  3.7  CL 103 99  --  100  CO2 28* 29*  --  30*  BUN 17 20  --  13  CREATININE 0.8 0.9  --  0.7  CALCIUM 8.4* 8.5*  --  8.8  MG  --   --  1.5 1.5   Recent Labs    03/26/19 0000 04/30/19 0000 10/02/19 0000  AST 8* 9* 9*  ALT 4* 4* 4*  ALKPHOS 69 77 75  ALBUMIN 3.5  --  3.3*   Recent Labs    04/03/19 0000 04/30/19 0000 10/02/19 0000  WBC 9.4 9.1 8.9  NEUTROABS 7,417 6,224 6,070  HGB 9.6* 10.7* 12.9*  HCT 31* 34* 37*  PLT 327 285 200   Lab Results  Component Value Date   TSH 2.58 10/02/2019   No results found for: HGBA1C Lab Results  Component Value Date   CHOL 109 10/02/2019   HDL 40 10/02/2019   LDLCALC 52 10/02/2019   TRIG 91 10/02/2019   CHOLHDL 2.9 02/12/2014    Significant Diagnostic Results in last 30 days:  No results found.  Assessment/Plan Urinary incontinence no urinary retention, on Mirabegron 25mg  qd, Tamsulosin 0.4mg  qd.  Iron deficiency anemia Iron was decreased to 3x/wk, pending f/u CBC  Constipation Stable, continue MiraLax.   Osteoarthritis of right knee OA, uses mechanical lift for transfer, takes prn Tylenol.  GERD (gastroesophageal reflux disease) Stable, continue  Esomeprazole.   ILD (interstitial lung disease) (HCC) Hx of Interstitial lung disease, O2 dependent      Diastolic CHF (HCC) CHF/minimal edema BLE, on Furosamide 40mg  qd. Chronic DOE in setting of interstitial lung disease.   Persistent atrial fibrillation ([place]):  CHA2DS2-VASc Score 4; On Eliquis Heart rate is in control, continue Diltiazem, Eliquis.   Anxiety Stable, continue Alprazolam.   Angular cheilitis Nystatin/Triamcinolone cream bid to corners of mouth bid x 7 days, then prn.      Family/ staff Communication: plan of care reviewed with the patient and charge nurse.   Labs/tests ordered:  none  Time spend 35 minutes.

## 2020-02-12 NOTE — Assessment & Plan Note (Signed)
Iron was decreased to 3x/wk, pending f/u CBC

## 2020-02-12 NOTE — Assessment & Plan Note (Signed)
Hx of Interstitial lung disease, O2 dependent    °

## 2020-02-12 NOTE — Assessment & Plan Note (Signed)
Stable, continue Esomeprazole.

## 2020-02-12 NOTE — Assessment & Plan Note (Signed)
Heart rate is in control, continue Diltiazem, Eliquis.

## 2020-02-12 NOTE — Assessment & Plan Note (Signed)
CHF/minimal edema BLE, on Furosamide 40mg  qd. Chronic DOE in setting of interstitial lung disease.

## 2020-02-13 DIAGNOSIS — K130 Diseases of lips: Secondary | ICD-10-CM | POA: Insufficient documentation

## 2020-02-13 NOTE — Assessment & Plan Note (Signed)
Nystatin/Triamcinolone cream bid to corners of mouth bid x 7 days, then prn.

## 2020-02-18 DIAGNOSIS — I10 Essential (primary) hypertension: Secondary | ICD-10-CM | POA: Diagnosis not present

## 2020-02-18 LAB — CBC AND DIFFERENTIAL
HCT: 40 — AB (ref 41–53)
Hemoglobin: 14 (ref 13.5–17.5)
Platelets: 217 (ref 150–399)
WBC: 8.9

## 2020-02-18 LAB — CBC: RBC: 4.11 (ref 3.87–5.11)

## 2020-03-17 ENCOUNTER — Non-Acute Institutional Stay (SKILLED_NURSING_FACILITY): Payer: Medicare Other | Admitting: Internal Medicine

## 2020-03-17 ENCOUNTER — Encounter: Payer: Self-pay | Admitting: Internal Medicine

## 2020-03-17 DIAGNOSIS — I5030 Unspecified diastolic (congestive) heart failure: Secondary | ICD-10-CM

## 2020-03-17 DIAGNOSIS — E785 Hyperlipidemia, unspecified: Secondary | ICD-10-CM | POA: Diagnosis not present

## 2020-03-17 DIAGNOSIS — G4733 Obstructive sleep apnea (adult) (pediatric): Secondary | ICD-10-CM | POA: Diagnosis not present

## 2020-03-17 DIAGNOSIS — I2510 Atherosclerotic heart disease of native coronary artery without angina pectoris: Secondary | ICD-10-CM | POA: Diagnosis not present

## 2020-03-17 DIAGNOSIS — J849 Interstitial pulmonary disease, unspecified: Secondary | ICD-10-CM | POA: Diagnosis not present

## 2020-03-17 DIAGNOSIS — R5381 Other malaise: Secondary | ICD-10-CM | POA: Diagnosis not present

## 2020-03-17 DIAGNOSIS — I4819 Other persistent atrial fibrillation: Secondary | ICD-10-CM | POA: Diagnosis not present

## 2020-03-17 DIAGNOSIS — Z9861 Coronary angioplasty status: Secondary | ICD-10-CM

## 2020-03-17 DIAGNOSIS — I10 Essential (primary) hypertension: Secondary | ICD-10-CM | POA: Diagnosis not present

## 2020-03-17 NOTE — Progress Notes (Signed)
Location:  [facility] Room Number: [place] of Service:  SNF [phone])  Provider:  Veleta Miners, MD  Code Status:  FULL CODE Goals of Care:  Advanced Directives 02/12/2020  Does Patient Have a Medical Advance Directive? Yes  Type of Paramedic of [place];Living will  Does patient want to make changes to medical advance directive? No - Patient declined  Copy of [place] in Chart? Yes - validated most recent copy scanned in chart (See row information)  Would patient like information on creating a medical advance directive? -  Pre-existing out of facility DNR order (yellow form or pink MOST form) -     Chief Complaint  Patient presents with  . Medical Management of Chronic Issues    Routine [facility] visit    HPI: Patient is an 86 y.o. male seen today for medical management of chronic diseases.    Patient has a history of CAD s/p PTCA, chronic atrial fibrillation on Eliquis, chronic diastolic CHF,Interstitial lung disease with hypoxia on chronic oxygen, hypertension, hyperlipidemia, OSA on CPAP, chronic back pain with spinal stenosis, S C-spine kyphosis, cognitive impairment  Long term Resident of facility Doing well. Staying stable No Complains  Weight stable No New Nursing issues. Stays in his Room and his Recliner. Needs help with transfers and ADLS  Past Medical History:  Diagnosis Date  . Anxiety   . CAD S/P percutaneous coronary angioplasty 1996, 2009, 01/2010   PTCA of L Cx 1996; BMS-RCA [facility]) 2009; Staged PCI RCA (70% ISR) --> 3.0 mm x 23 mm BMS, staged PCI Diag - 2.0 mm x 12 mm Mini Vision BMS (2.25 mm)  . Cataracts, bilateral    immature  . Chronic back pain    scoliosis--pt states unable to have surgery bc of age  . GERD (gastroesophageal reflux disease)    takes Nexium daily  . History of colon polyps   . Hyperlipidemia   . Hypertension   . Myocardial infarction [facility]) 1996,  2009   x 2;last time was about 8-94yrs ago   . Obesity (BMI 30.0-34.9)   . OSA (obstructive sleep apnea)   . Osteoarthritis of both knees   . Peripheral edema    Venous Stasis  . Persistent atrial fibrillation (HCC)    Unable to maintaine NSR after DCCV with Tikosyn.  Plan = rate control w/diltiazem (beta blocker off b/c fatigue). CHA2DS2Vasc 4. DOAC - Eliquis.   . Prostate cancer ([place]) 2005   seed implant    Past Surgical History:  Procedure Laterality Date  . CARDIOVERSION N/A 06/12/2015   Procedure: CARDIOVERSION;  Surgeon: Sanda Klein, MD;  Location: [facility];  Service: Cardiovascular;  Laterality: N/A;  . CARDIOVERSION N/A 08/02/2015   Procedure: CARDIOVERSION;  Surgeon: [name], MD;  Location: [facility];  Service: Cardiovascular;  Laterality: N/A;  . CHOLECYSTECTOMY    . COLONOSCOPY    . CORONARY ANGIOPLASTY WITH STENT PLACEMENT  1996, 2009, August 2011   PTCA of L Cx 1996; BMS-RCA [facility]) 2009; Staged PCI RCA (70% ISR) --> 3.0 mm x 23 mm BMS, staged PCI Diag - 2.0 mm x 12 mm Mini Vision BMS (2.25 mm)  . cyst removed  in college  . NEUROPLASTY / TRANSPOSITION MEDIAN NERVE AT CARPAL TUNNEL BILATERAL    . NM MYOVIEW LTD  November 2013   EF 53%; fixed mid inferior-inferolateral defect, infarct with no ischemia.  . right knee arthrsoscopy  32yrs ago  .  right trigger finger    . seeds placed into prostate   2005  . TEE WITHOUT CARDIOVERSION N/A 06/12/2015   Procedure: TRANSESOPHAGEAL ECHOCARDIOGRAM (TEE) - WITH CARDIOVERSION;  Surgeon: Sanda Klein, MD;  Location: [facility];  Service: Cardiovascular: EF 55-60%.No LA or LAA thrombus. Normal Peal PAP ~33 mmHg.  [name] TOTAL KNEE ARTHROPLASTY  05/09/2012   Procedure: TOTAL KNEE ARTHROPLASTY;  Surgeon: Kerin Salen, MD;  Location: [facility];  Service: Orthopedics;  Laterality: Right;  . TRANSTHORACIC ECHOCARDIOGRAM  06/2015   EF 60-65%. Mild LA dilation. Normal PA pressures: 32 mmHg. Aortic sclerosis but no stenosis.  [name] wisdom teeth  extraccted      Allergies  Allergen Reactions  . Crab [Shellfish Allergy] Rash  . Hydrocodone Hives  . Adhesive [Tape] Rash  . Oxycodone Rash    Outpatient Encounter Medications as of 03/17/2020  Medication Sig  . acetaminophen (TYLENOL) 500 MG tablet Take 1,000 mg by mouth. Three times a day as needed  . atorvastatin (LIPITOR) 40 MG tablet TAKE 1 TABLET DAILY AT BEDTIME  . barrier cream (NON-SPECIFIED) CREA Apply 1 application topically. Zinc Oxide to buttocks area when assisting resident with toileting for redness every shift  . Cholecalciferol (VITAMIN D3) 1000 units CAPS Take 1 capsule by mouth daily.   [name] diltiazem (DILACOR XR) 240 MG 24 hr capsule Take 240 mg by mouth daily. For heart failure  . ELIQUIS 5 MG TABS tablet TAKE 1 TABLET TWICE A DAY (SCHEDULE FOLLOW UP WITH CARDIOLOGIST PRIOR TO NEXT REFILL REQUEST)  . esomeprazole (NEXIUM) 40 MG capsule TAKE 1 CAPSULE DAILY  . ferrous sulfate 325 (65 FE) MG tablet Take 325 mg by mouth daily with breakfast. Mon, Wed, Fri  . furosemide (LASIX) 40 MG tablet Take 40 mg by mouth daily.  [name] losartan (COZAAR) 50 MG tablet Take 50 mg by mouth daily.   . magnesium oxide (MAG-OX) 400 MG tablet Take 200 mg by mouth daily. 1/2 tablet  . Menthol, Topical Analgesic, (BIOFREEZE) 4 % GEL Apply 1 application topically in the morning, at noon, in the evening, and at bedtime. Apply to right great toe  . mirabegron ER (MYRBETRIQ) 25 MG TB24 tablet Take 25 mg by mouth daily.   [name] NITROSTAT 0.4 MG SL tablet USE AS NEEDED  . nystatin (MYCOSTATIN/NYSTOP) powder Apply 1 application topically. Apply to back, R/L buttocks, groin, and under breasts Twice A Day  . nystatin cream (MYCOSTATIN) Apply 1 application topically 2 (two) times daily as needed for dry skin. Apply to corners of mouth rash  . OXYGEN Inhale 2 L into the lungs continuous. For obstructive sleep apnea  . Polyethylene Glycol 3350 (MIRALAX PO) Take 17 g by mouth daily.   . Sodium Fluoride  (PREVIDENT 5000 BOOSTER PLUS) 1.1 % PSTE Place 1 application onto teeth at bedtime.  . tamsulosin (FLOMAX) 0.4 MG CAPS capsule Take 0.4 mg by mouth daily.   [name] triamcinolone cream (KENALOG) 0.1 % Apply 1 application topically 2 (two) times daily as needed. Apply to corners of mouth rash   No facility-administered encounter medications on file as of 03/17/2020.    Review of Systems:  Review of Systems  Review of Systems  Constitutional: Negative for activity change, appetite change, chills, diaphoresis, fatigue and fever.  HENT: Negative for mouth sores, postnasal drip, rhinorrhea, sinus pain and sore throat.   Respiratory: Negative for apnea, cough, chest tightness, shortness of breath and wheezing.   Cardiovascular: Negative for chest pain, palpitations and leg swelling.  Gastrointestinal: Negative for abdominal distention, abdominal pain, constipation, diarrhea, nausea and vomiting.  Genitourinary: Negative for dysuria and frequency.  Musculoskeletal: Negative for arthralgias, joint swelling and myalgias.  Skin: Negative for rash.  Neurological: Negative for dizziness, syncope, weakness, light-headedness and numbness.  Psychiatric/Behavioral: Negative for behavioral problems, confusion and sleep disturbance.     Health Maintenance  Topic Date Due  . INFLUENZA VACCINE  09/03/2020 (Originally 01/05/2020)  . TETANUS/TDAP  12/12/2022  . COVID-19 Vaccine  Completed  . PNA vac Low Risk Adult  Completed    Physical Exam: Vitals:   03/17/20 1429  BP: 120/80  Pulse: 64  Resp: (!) 22  Temp: 97.8 F (36.6 C)  TempSrc: Oral  SpO2: 96%  Weight: 217 lb (98.4 kg)  Height: 5\' 7"  (1.702 m)   Body mass index is 33.99 kg/m. Physical Exam Vitals reviewed.  Constitutional:      Appearance: He is obese.  HENT:     Head: Normocephalic.     Nose: Nose normal.     Mouth/Throat:     Mouth: Mucous membranes are moist.     Pharynx: Oropharynx is clear.  Eyes:     Pupils: Pupils are equal,  round, and reactive to light.  Cardiovascular:     Rate and Rhythm: Normal rate and regular rhythm.     Pulses: Normal pulses.     Heart sounds: Normal heart sounds.  Pulmonary:     Effort: Pulmonary effort is normal.     Breath sounds: Normal breath sounds.  Abdominal:     General: Abdomen is flat. Bowel sounds are normal.     Palpations: Abdomen is soft.  Musculoskeletal:        General: No swelling.     Cervical back: Neck supple.  Skin:    General: Skin is warm.  Neurological:     General: No focal deficit present.     Mental Status: He is alert and oriented to person, place, and time.  Psychiatric:        Mood and Affect: Mood normal.        Thought Content: Thought content normal.     Labs reviewed: Basic Metabolic Panel: Recent Labs    03/26/19 0000 04/30/19 0000 08/27/19 0000 10/02/19 0000  NA 142 137  --  138  K 4.0 4.2  --  3.7  CL 103 99  --  100  CO2 28* 29*  --  30*  BUN 17 20  --  13  CREATININE 0.8 0.9  --  0.7  CALCIUM 8.4* 8.5*  --  8.8  MG  --   --  1.5 1.5  TSH  --   --   --  2.58   Liver Function Tests: Recent Labs    03/26/19 0000 04/30/19 0000 10/02/19 0000  AST 8* 9* 9*  ALT 4* 4* 4*  ALKPHOS 69 77 75  ALBUMIN 3.5  --  3.3*   No results for input(s): LIPASE, AMYLASE in the last 8760 hours. No results for input(s): AMMONIA in the last 8760 hours. CBC: Recent Labs    04/03/19 0000 04/30/19 0000 10/02/19 0000  WBC 9.4 9.1 8.9  NEUTROABS 7,417 6,224 6,070  HGB 9.6* 10.7* 12.9*  HCT 31* 34* 37*  PLT 327 285 200   Lipid Panel: Recent Labs    04/30/19 0000 10/02/19 0000  CHOL 108 109  HDL 41 40  LDLCALC 50 52  TRIG 88 91   No results found for: HGBA1C  Procedures since last visit: No results found.  Assessment/Plan Diastolic congestive heart failure, unspecified HF chronicity (HCC) On Lasix  Doing well Repeat BMP Persistent atrial fibrillation ([place]):  CHA2DS2-VASc Score 4; On Eliquis On Eliquis ILD (interstitial  lung disease) ([place]) Continue Oxygen  Essential hypertension Stable on Cozaar and Cardizem Physical deconditioning Refuses Therapy OSA (obstructive sleep apnea) Tolerating CPAP Hyperlipidemia with target LDL less than 70 LDL at target on Lipitor CAD S/P percutaneous coronary angioplasty On Statin and Eliquis Urinary Incontinence On Myrbetriq and Flomax  Iron Def Anemia Last hgb normal on Low dose of Iron Anxiety On Low dose of PRN xanax Labs/tests ordered:  CBC,CMP in 2 weeks

## 2020-03-19 ENCOUNTER — Non-Acute Institutional Stay (SKILLED_NURSING_FACILITY): Payer: Medicare Other | Admitting: Nurse Practitioner

## 2020-03-19 DIAGNOSIS — J849 Interstitial pulmonary disease, unspecified: Secondary | ICD-10-CM

## 2020-03-19 DIAGNOSIS — I5030 Unspecified diastolic (congestive) heart failure: Secondary | ICD-10-CM

## 2020-03-19 DIAGNOSIS — R531 Weakness: Secondary | ICD-10-CM | POA: Diagnosis not present

## 2020-03-19 DIAGNOSIS — K5900 Constipation, unspecified: Secondary | ICD-10-CM | POA: Diagnosis not present

## 2020-03-19 DIAGNOSIS — I10 Essential (primary) hypertension: Secondary | ICD-10-CM | POA: Diagnosis not present

## 2020-03-19 DIAGNOSIS — M1711 Unilateral primary osteoarthritis, right knee: Secondary | ICD-10-CM

## 2020-03-19 DIAGNOSIS — K219 Gastro-esophageal reflux disease without esophagitis: Secondary | ICD-10-CM | POA: Diagnosis not present

## 2020-03-19 DIAGNOSIS — F419 Anxiety disorder, unspecified: Secondary | ICD-10-CM

## 2020-03-19 DIAGNOSIS — I4819 Other persistent atrial fibrillation: Secondary | ICD-10-CM

## 2020-03-19 DIAGNOSIS — R32 Unspecified urinary incontinence: Secondary | ICD-10-CM | POA: Diagnosis not present

## 2020-03-19 DIAGNOSIS — D508 Other iron deficiency anemias: Secondary | ICD-10-CM | POA: Diagnosis not present

## 2020-03-19 NOTE — Assessment & Plan Note (Signed)
Stable. Continue Fe

## 2020-03-19 NOTE — Assessment & Plan Note (Signed)
Hx of Interstitial lung disease, O2 dependent    °

## 2020-03-19 NOTE — Assessment & Plan Note (Signed)
Urinary incontinent, no urinary retention, on Mirabegron 25mg qd, Tamsulosin 0.4mg qd.  

## 2020-03-19 NOTE — Progress Notes (Signed)
Location:   SNF [place] [facility] Number: 5 Place of Service:  SNF (31) Provider: [facility] - Kmi Adaijah Endres NP  Virgie Dad, MD  Patient Care Team: Virgie Dad, MD as PCP - General (Internal Medicine) Leonie Batina Dougan, MD as PCP - Cardiology (Cardiology)  Extended Emergency Contact Information Primary Emergency Contact: [facility] Address: [address]          [place], [place] [address] [name] of [place] Phone: [phone] Mobile Phone: [phone] Relation: Spouse Secondary Emergency Contact: Janey Genta Address: [address]          [place], [place] Phone: [phone] Mobile Phone: [phone] Relation: Daughter  Code Status: DNR Goals of care: Advanced Directive information Advanced Directives 02/12/2020  Does Patient Have a Medical Advance Directive? Yes  Type of Paramedic of [place];Living will  Does patient want to make changes to medical advance directive? No - Patient declined  Copy of Daisy in Chart? Yes - validated most recent copy scanned in chart (See row information)  Would patient like information on creating a medical advance directive? -  Pre-existing out of facility DNR order (yellow form or pink MOST form) -     Chief Complaint  Patient presents with  . Acute Visit    Generalized weakness    HPI:  Pt is a 86 y.o. male seen today for an acute visit for generalized weakness, denied headache, dizziness, change of vision, chest pain/pressrue, palpitation, nausea, vomiting, abd pain, or dysuria. Afebrile.  Urinary incontinent, no urinary retention, on Mirabegron 34m qd, Tamsulosin 0.435mqd. Constipation, stable, on MiraLax qd.             OA, uses mechanical lift for transfer, takes prn Tylenol.  Hx of Interstitial lung disease, O2 dependent             CHF/minimal edema BLE, on Furosamide 4037md.               Anxiety, stable. Off alprazolam.   Afib, heart rate is in control, on Diltiazem 240m21m, Eliquis 5mg 71m.  GERD, stable, on Esomeprazole 40mg 78m Anemia, stable, on Fe.  HTN, blood pressure is controlled on Losartan 50mg q60miltiazem 240mg qd24mrosemide 40mg qd.41m   Past Medical History:  Diagnosis Date  . Anxiety   . CAD S/P percutaneous coronary angioplasty 1996, 2009, 01/2010   PTCA of L Cx 1996; B[facility]-RCA [facility] (70% ISR) --> 3.0 mm x 23 mm BMS, staged PCI Diag - 2.0 mm x 12 mm Mini Vision BMS (2.25 mm)  . Cataracts, bilateral    immature  . Chronic back pain    scoliosis--pt states unable to have surgery bc of age  . GERD (gastroesophageal reflux disease)    takes Nexium daily  . History of colon polyps   . Hyperlipidemia   . Hypertension   . Myocardial infarction (HCC) 199[facility]009   x 2;last time was about 8-35yrs ago58yrObesity (BMI 30.0-34.9)   . OSA (obstructive sleep apnea)   . Osteoarthritis of both knees   . Peripheral edema    Venous Stasis  . Persistent atrial fibrillation (HCC)    Unable to maintaine NSR after DCCV with Tikosyn.  Plan = rate control w/diltiazem (beta blocker off b/c fatigue). CHA2DS2Vasc 4. DOAC - Eliquis.   . Prostate cancer (HCC) 2005 Clawsoneed implant   Past  Surgical History:  Procedure Laterality Date  . CARDIOVERSION N/A 06/12/2015   Procedure: CARDIOVERSION;  Surgeon: Sanda Klein, MD;  Location: [place] [facility];  Service: Cardiovascular;  Laterality: N/A;  . CARDIOVERSION N/A 08/02/2015   Procedure: CARDIOVERSION;  Surgeon: [name], MD;  Location: [place];  Service: Cardiovascular;  Laterality: N/A;  . CHOLECYSTECTOMY    . COLONOSCOPY    . CORONARY ANGIOPLASTY WITH STENT PLACEMENT  1996, 2009, August 2011   PTCA of L Cx 1996; BMS-RCA [facility]) 2009; Staged PCI RCA (70% ISR) --> 3.0 mm x 23 mm BMS, staged PCI Diag - 2.0 mm x 12 mm Mini Vision BMS (2.25 mm)  . cyst  removed  in college  . NEUROPLASTY / TRANSPOSITION MEDIAN NERVE AT CARPAL TUNNEL BILATERAL    . NM MYOVIEW LTD  November 2013   EF 53%; fixed mid inferior-inferolateral defect, infarct with no ischemia.  . right knee arthrsoscopy  31yrs ago  . right trigger finger    . seeds placed into prostate   2005  . TEE WITHOUT CARDIOVERSION N/A 06/12/2015   Procedure: TRANSESOPHAGEAL ECHOCARDIOGRAM (TEE) - WITH CARDIOVERSION;  Surgeon: Sanda Klein, MD;  Location: [place];  Service: Cardiovascular: EF 55-60%.No LA or LAA thrombus. Normal Peal PAP ~33 mmHg.  [name] TOTAL KNEE ARTHROPLASTY  05/09/2012   Procedure: TOTAL KNEE ARTHROPLASTY;  Surgeon: Kerin Salen, MD;  Location: [place];  Service: Orthopedics;  Laterality: Right;  . TRANSTHORACIC ECHOCARDIOGRAM  06/2015   EF 60-65%. Mild LA dilation. Normal PA pressures: 32 mmHg. Aortic sclerosis but no stenosis.  [name] wisdom teeth extraccted      Allergies  Allergen Reactions  . Crab [Shellfish Allergy] Rash  . Hydrocodone Hives  . Adhesive [Tape] Rash  . Oxycodone Rash    Allergies as of 03/19/2020      Reactions   Crab [shellfish Allergy] Rash   Hydrocodone Hives   Adhesive [tape] Rash   Oxycodone Rash      Medication List       Accurate as of March 19, 2020 11:59 PM. If you have any questions, ask your nurse or doctor.        acetaminophen 500 MG tablet Commonly known as: TYLENOL Take 1,000 mg by mouth. Three times a day as needed   atorvastatin 40 MG tablet Commonly known as: LIPITOR TAKE 1 TABLET DAILY AT BEDTIME   barrier cream Crea Commonly known as: non-specified Apply 1 application topically. Zinc Oxide to buttocks area when assisting resident with toileting for redness every shift   Biofreeze 4 % Gel Generic drug: Menthol (Topical Analgesic) Apply 1 application topically in the morning, at noon, in the evening, and at bedtime. Apply to right great toe   carbamide peroxide 6.5 % OTIC solution Commonly known as:  DEBROX 5 drops at bedtime.   diltiazem 240 MG 24 hr capsule Commonly known as: DILACOR XR Take 240 mg by mouth daily. For heart failure   Eliquis 5 MG Tabs tablet Generic drug: apixaban TAKE 1 TABLET TWICE A DAY (SCHEDULE FOLLOW UP WITH CARDIOLOGIST PRIOR TO NEXT REFILL REQUEST)   esomeprazole 40 MG capsule Commonly known as: NEXIUM TAKE 1 CAPSULE DAILY   ferrous sulfate 325 (65 FE) MG tablet Take 325 mg by mouth daily with breakfast. Mon, Wed, Fri   furosemide 40 MG tablet Commonly known as: LASIX Take 40 mg by mouth daily.   losartan 50 MG tablet Commonly known as: COZAAR Take 50 mg by mouth daily.   magnesium oxide 400  MG tablet Commonly known as: MAG-OX Take 200 mg by mouth daily. 1/2 tablet   MIRALAX PO Take 17 g by mouth daily.   Myrbetriq 25 MG Tb24 tablet Generic drug: mirabegron ER Take 25 mg by mouth daily.   Nitrostat 0.4 MG SL tablet Generic drug: nitroGLYCERIN USE AS NEEDED   nystatin powder Commonly known as: MYCOSTATIN/NYSTOP Apply 1 application topically. Apply to back, R/L buttocks, groin, and under breasts Twice A Day   nystatin cream Commonly known as: MYCOSTATIN Apply 1 application topically 2 (two) times daily as needed for dry skin. Apply to corners of mouth rash   OXYGEN Inhale 2 L into the lungs continuous. For obstructive sleep apnea   PreviDent 5000 Booster Plus 1.1 % Pste Generic drug: Sodium Fluoride Place 1 application onto teeth at bedtime.   tamsulosin 0.4 MG Caps capsule Commonly known as: FLOMAX Take 0.4 mg by mouth daily.   triamcinolone cream 0.1 % Commonly known as: KENALOG Apply 1 application topically 2 (two) times daily as needed. Apply to corners of mouth rash   Vitamin D3 25 MCG (1000 UT) Caps Take 1 capsule by mouth daily.       Review of Systems  Constitutional: Positive for activity change, appetite change and fatigue. Negative for fever.  HENT: Positive for hearing loss. Negative for congestion and  voice change.   Eyes: Negative for visual disturbance.  Respiratory: Positive for shortness of breath. Negative for cough and wheezing.        O2 dependent. DOE  Cardiovascular: Positive for leg swelling.  Gastrointestinal: Negative for abdominal pain, constipation, nausea and vomiting.       Incontinent of feces. Had BM this morning  Genitourinary: Negative for difficulty urinating, dysuria and urgency.       Incontinent of urine.   Musculoskeletal: Positive for arthralgias and gait problem.       Left knee pain is chronic. The right great toe pain at the base of the toe nail, but no warmth, redness, or swelling.   Skin: Negative for pallor.  Neurological: Negative for facial asymmetry, speech difficulty, weakness, light-headedness and headaches.       Memory lapses.   Psychiatric/Behavioral: Negative for behavioral problems and sleep disturbance. The patient is not nervous/anxious.     Immunization History  Administered Date(s) Administered  . H1N1 04/13/2010  . Influenza, High Dose Seasonal PF 02/12/2014, 03/29/2018, 01/05/2019  . Influenza-Unspecified 03/06/2013, 02/12/2014, 03/06/2017  . Moderna SARS-COVID-2 Vaccination 06/08/2019, 07/06/2019  . Pneumococcal Conjugate-13 02/12/2014  . Pneumococcal Polysaccharide-23 02/04/2013, 02/12/2014  . Td 09/04/2005  . Tdap 12/11/2012  . Zoster 07/07/2006   Pertinent  Health Maintenance Due  Topic Date Due  . INFLUENZA VACCINE  09/03/2020 (Originally 01/05/2020)  . PNA vac Low Risk Adult  Completed   No flowsheet data found. Functional Status Survey:    Vitals:   03/20/20 1617  BP: 124/74  Pulse: 80  Resp: 18  Temp: 97.9 F (36.6 C)  SpO2: 95%  Weight: 217 lb (98.4 kg)  Height: _0  (1.702 m)   Body mass index is 33.99 kg/m. Physical Exam Vitals and nursing note reviewed.  Constitutional:      Comments: Tired appearance  HENT:     Head: Normocephalic and atraumatic.     Nose: Nose normal.     Mouth/Throat:      Mouth: Mucous membranes are dry.  Eyes:     Extraocular Movements: Extraocular movements intact.     Conjunctiva/sclera: Conjunctivae normal.  Pupils: Pupils are equal, round, and reactive to light.  Cardiovascular:     Rate and Rhythm: Normal rate. Rhythm irregular.     Heart sounds: No murmur heard.      Comments: DP pulses not felt prior Pulmonary:     Breath sounds: No wheezing.     Comments: O2 dependent.  Abdominal:     General: Bowel sounds are normal.     Palpations: Abdomen is soft.     Tenderness: There is no abdominal tenderness. There is no right CVA tenderness, left CVA tenderness, guarding or rebound.  Musculoskeletal:     Cervical back: Normal range of motion and neck supple.     Right lower leg: Edema present.     Left lower leg: Edema present.     Comments: Trace edema BLE., less than prior.  Mechanical lift for transfer.   Skin:    General: Skin is warm and dry.     Comments: Angular cheilitis.  Neurological:     General: No focal deficit present.     Mental Status: He is alert and oriented to person, place, and time. Mental status is at baseline.     Motor: No weakness.     Coordination: Coordination normal.     Gait: Gait abnormal.  Psychiatric:        Mood and Affect: Mood normal.        Behavior: Behavior normal.        Thought Content: Thought content normal.        Judgment: Judgment normal.     Labs reviewed: Recent Labs    03/26/19 0000 04/30/19 0000 08/27/19 0000 10/02/19 0000  NA 142 137  --  138  K 4.0 4.2  --  3.7  CL 103 99  --  100  CO2 28* 29*  --  30*  BUN 17 20  --  13  CREATININE 0.8 0.9  --  0.7  CALCIUM 8.4* 8.5*  --  8.8  MG  --   --  1.5 1.5   Recent Labs    03/26/19 0000 04/30/19 0000 10/02/19 0000  AST 8* 9* 9*  ALT 4* 4* 4*  ALKPHOS 69 77 75  ALBUMIN 3.5  --  3.3*   Recent Labs    04/03/19 0000 04/03/19 0000 04/30/19 0000 10/02/19 0000 02/18/20 0000  WBC 9.4   < > 9.1 8.9 8.9  NEUTROABS 7,417  --   6,224 6,070  --   HGB 9.6*   < > 10.7* 12.9* 14.0  HCT 31*   < > 34* 37* 40*  PLT 327   < > 285 200 217   < > = values in this interval not displayed.   Lab Results  Component Value Date   TSH 2.58 10/02/2019   No results found for: HGBA1C Lab Results  Component Value Date   CHOL 109 10/02/2019   HDL 40 10/02/2019   LDLCALC 52 10/02/2019   TRIG 91 10/02/2019   CHOLHDL 2.9 02/12/2014    Significant Diagnostic Results in last 30 days:  No results found.  Assessment/Plan: Generalized weakness generalized weakness, denied headache, dizziness, change of vision, chest pain/pressrue, palpitation, nausea, vomiting, abd pain, or dysuria. Afebrile. Stat CBC/diff, CMP/eGFR, VS/neuro check q4hrs x 72hours.   Urinary incontinence Urinary incontinent, no urinary retention, on Mirabegron 52m qd, Tamsulosin 0.47mqd.  Constipation Constipation, stable, on MiraLax qd.   Osteoarthritis of right knee OA, uses mechanical lift for transfer, takes prn Tylenol.  ILD (interstitial lung disease) (HCC) Hx of Interstitial lung disease, O2 dependent  Diastolic CHF (HCC) CHF/minimal edema BLE, on Furosamide 978m qd.   Persistent atrial fibrillation ([place]:  CHA2DS2-VASc Score 4; On Eliquis Afib, heart rate is in control, on Diltiazem 2429mqd, Eliquis 78m21mid.   GERD (gastroesophageal reflux disease) GERD, stable, on Esomeprazole 78m73m.  Essential hypertension HTN, blood pressure is controlled on Losartan 50mg76m Diltiazem 278mg 64mFurosemide 78mg q23m Anxiety Improved, off Alprazolam.   Iron deficiency anemia Stable. Continue Fe    Family/ staff Communication: plan of care reviewed with the patient and charge nurse.   Labs/tests ordered:  CBC/diff, CMP/eGFR  Time spend 35 minutes.

## 2020-03-19 NOTE — Assessment & Plan Note (Signed)
OA, uses mechanical lift for transfer, takes prn Tylenol.   

## 2020-03-19 NOTE — Assessment & Plan Note (Addendum)
Improved, off Alprazolam.

## 2020-03-19 NOTE — Assessment & Plan Note (Signed)
Afib, heart rate is in control, on Diltiazem 240mg qd, Eliquis 5mg bid.   

## 2020-03-19 NOTE — Assessment & Plan Note (Signed)
Constipation, stable, on MiraLax qd.   

## 2020-03-19 NOTE — Assessment & Plan Note (Signed)
CHF/minimal edema BLE, on Furosamide 40mg qd.  

## 2020-03-19 NOTE — Assessment & Plan Note (Signed)
HTN, blood pressure is controlled on Losartan 50mg qd, Diltiazem 240mg qd, Furosemide 40mg qd.    

## 2020-03-19 NOTE — Assessment & Plan Note (Signed)
GERD, stable, on Esomeprazole 40mg qd.  

## 2020-03-19 NOTE — Assessment & Plan Note (Signed)
generalized weakness, denied headache, dizziness, change of vision, chest pain/pressrue, palpitation, nausea, vomiting, abd pain, or dysuria. Afebrile. Stat CBC/diff, CMP/eGFR, VS/neuro check q4hrs x 72hours.

## 2020-03-20 ENCOUNTER — Encounter: Payer: Self-pay | Admitting: Nurse Practitioner

## 2020-03-31 DIAGNOSIS — E785 Hyperlipidemia, unspecified: Secondary | ICD-10-CM | POA: Diagnosis not present

## 2020-03-31 DIAGNOSIS — E870 Hyperosmolality and hypernatremia: Secondary | ICD-10-CM | POA: Diagnosis not present

## 2020-03-31 DIAGNOSIS — D509 Iron deficiency anemia, unspecified: Secondary | ICD-10-CM | POA: Diagnosis not present

## 2020-04-01 LAB — HEPATIC FUNCTION PANEL
ALT: 4 — AB (ref 10–40)
AST: 10 — AB (ref 14–40)
Alkaline Phosphatase: 72 (ref 25–125)
Bilirubin, Total: 0.7

## 2020-04-01 LAB — BASIC METABOLIC PANEL
BUN: 19 (ref 4–21)
CO2: 32 — AB (ref 13–22)
Chloride: 100 (ref 99–108)
Creatinine: 0.9 (ref 0.6–1.3)
Glucose: 99
Potassium: 4 (ref 3.4–5.3)
Sodium: 140 (ref 137–147)

## 2020-04-01 LAB — CBC AND DIFFERENTIAL
HCT: 39 — AB (ref 41–53)
Hemoglobin: 14 (ref 13.5–17.5)
Platelets: 189 (ref 150–399)
WBC: 8.6

## 2020-04-01 LAB — COMPREHENSIVE METABOLIC PANEL
Albumin: 3.5 (ref 3.5–5.0)
Calcium: 9.2 (ref 8.7–10.7)
Globulin: 2.8

## 2020-04-01 LAB — CBC: RBC: 4 (ref 3.87–5.11)

## 2020-04-09 ENCOUNTER — Non-Acute Institutional Stay (SKILLED_NURSING_FACILITY): Payer: Medicare Other | Admitting: Nurse Practitioner

## 2020-04-09 ENCOUNTER — Encounter: Payer: Self-pay | Admitting: Nurse Practitioner

## 2020-04-09 DIAGNOSIS — F419 Anxiety disorder, unspecified: Secondary | ICD-10-CM

## 2020-04-09 DIAGNOSIS — D508 Other iron deficiency anemias: Secondary | ICD-10-CM | POA: Diagnosis not present

## 2020-04-09 DIAGNOSIS — M1711 Unilateral primary osteoarthritis, right knee: Secondary | ICD-10-CM | POA: Diagnosis not present

## 2020-04-09 DIAGNOSIS — I5030 Unspecified diastolic (congestive) heart failure: Secondary | ICD-10-CM | POA: Diagnosis not present

## 2020-04-09 DIAGNOSIS — K5900 Constipation, unspecified: Secondary | ICD-10-CM | POA: Diagnosis not present

## 2020-04-09 DIAGNOSIS — I4819 Other persistent atrial fibrillation: Secondary | ICD-10-CM | POA: Diagnosis not present

## 2020-04-09 DIAGNOSIS — I10 Essential (primary) hypertension: Secondary | ICD-10-CM | POA: Diagnosis not present

## 2020-04-09 DIAGNOSIS — K219 Gastro-esophageal reflux disease without esophagitis: Secondary | ICD-10-CM | POA: Diagnosis not present

## 2020-04-09 DIAGNOSIS — R32 Unspecified urinary incontinence: Secondary | ICD-10-CM

## 2020-04-09 DIAGNOSIS — J849 Interstitial pulmonary disease, unspecified: Secondary | ICD-10-CM | POA: Diagnosis not present

## 2020-04-09 NOTE — Assessment & Plan Note (Signed)
Stable, off Alprazolam.

## 2020-04-09 NOTE — Assessment & Plan Note (Signed)
HTN, blood pressure is controlled on Losartan 50mg  qd, Diltiazem 240mg  qd, Furosemide 40mg  qd.

## 2020-04-09 NOTE — Assessment & Plan Note (Addendum)
Anemia, stable, on Fe. Hgb 14 03/31/20

## 2020-04-09 NOTE — Assessment & Plan Note (Signed)
OA, uses mechanical lift for transfer, takes prn Tylenol.

## 2020-04-09 NOTE — Assessment & Plan Note (Signed)
CHF/minimal edema BLE, on Furosamide 40mg  qd.

## 2020-04-09 NOTE — Assessment & Plan Note (Signed)
Constipation, stable, on MiraLax qd.

## 2020-04-09 NOTE — Assessment & Plan Note (Signed)
GERD, stable, on Esomeprazole 40mg  qd.

## 2020-04-09 NOTE — Progress Notes (Signed)
Location:   [place] Room Number: 5 Place of Service:  SNF (31) Provider: Lennie Odor Jewel Mcafee NP  Virgie Dad, MD  Patient Care Team: Virgie Dad, MD as PCP - General (Internal Medicine) Leonie Messi Twedt, MD as PCP - Cardiology (Cardiology)  Extended Emergency Contact Information Primary Emergency Contact: [facility] Address: [address]          [place], [place] [address] [name] of [place] Phone: [phone] Mobile Phone: [phone] Relation: Spouse Secondary Emergency Contact: Janey Genta Address: [place], [place] Phone: [phone] Mobile Phone: [phone] Relation: Daughter  Code Status:  Full Code Goals of care: Advanced Directive information Advanced Directives 02/12/2020  Does Patient Have a Medical Advance Directive? Yes  Type of Paramedic of [place];Living will  Does patient want to make changes to medical advance directive? No - Patient declined  Copy of [place] in Chart? Yes - validated most recent copy scanned in chart (See row information)  Would patient like information on creating a medical advance directive? -  Pre-existing out of facility DNR order (yellow form or pink MOST form) -     Chief Complaint  Patient presents with  . Medical Management of Chronic Issues    HPI:  Pt is a 86 y.o. male seen today for medical management of chronic diseases.    Urinary incontinent, no urinary retention, on Mirabegron 25mg  qd, Tamsulosin 0.4mg  qd. Constipation, stable, on MiraLax qd. OA, uses mechanical lift for transfer, takes prn Tylenol.  Hx of Interstitial lung disease, O2 dependent CHF/minimal edema BLE, on Furosamide 40mg  qd. Bun/creat 10/0.87 03/31/20 Anxiety,stable. Off alprazolam.              Afib, heart rate is in control, on Diltiazem  240mg  qd, Eliquis 5mg  bid.  GERD, stable, on Esomeprazole 40mg  qd.  Anemia, stable, on Fe. Hgb 14.0 03/31/20 HTN, blood pressure is controlled on Losartan 50mg  qd, Diltiazem 240mg  qd, Furosemide 40mg  qd.    Past Medical History:  Diagnosis Date  . Anxiety   . CAD S/P percutaneous coronary angioplasty 1996, 2009, 01/2010   PTCA of L Cx 1996; BMS-RCA [facility]) 2009; Staged PCI RCA (70% ISR) --> 3.0 mm x 23 mm BMS, staged PCI Diag - 2.0 mm x 12 mm Mini Vision BMS (2.25 mm)  . Cataracts, bilateral    immature  . Chronic back pain    scoliosis--pt states unable to have surgery bc of age  . GERD (gastroesophageal reflux disease)    takes Nexium daily  . History of colon polyps   . Hyperlipidemia   . Hypertension   . Myocardial infarction [facility]) 1996, 2009   x 2;last time was about 8-42yrs ago   . Obesity (BMI 30.0-34.9)   . OSA (obstructive sleep apnea)   . Osteoarthritis of both knees   . Peripheral edema    Venous Stasis  . Persistent atrial fibrillation (HCC)    Unable to maintaine NSR after DCCV with Tikosyn.  Plan = rate control w/diltiazem (beta blocker off b/c fatigue). CHA2DS2Vasc 4. DOAC - Eliquis.   . Prostate cancer ([place]) 2005   seed implant   Past Surgical History:  Procedure Laterality Date  . CARDIOVERSION N/A 06/12/2015   Procedure: CARDIOVERSION;  Surgeon: Sanda Klein, MD;  Location: [place] ENDOSCOPY;  Service: Cardiovascular;  Laterality: N/A;  . CARDIOVERSION N/A 08/02/2015   Procedure: CARDIOVERSION;  Surgeon: [name], MD;  Location: [place];  Service: Cardiovascular;  Laterality: N/A;  . CHOLECYSTECTOMY    . COLONOSCOPY    . CORONARY ANGIOPLASTY WITH STENT PLACEMENT  1996, 2009, August 2011   PTCA of L Cx 1996; BMS-RCA [facility]) 2009; Staged PCI RCA (70% ISR) --> 3.0 mm x 23 mm BMS, staged PCI Diag - 2.0 mm x 12 mm Mini Vision BMS (2.25 mm)  . cyst removed  in college  . NEUROPLASTY / TRANSPOSITION MEDIAN NERVE AT CARPAL TUNNEL  BILATERAL    . NM MYOVIEW LTD  November 2013   EF 53%; fixed mid inferior-inferolateral defect, infarct with no ischemia.  . right knee arthrsoscopy  37yrs ago  . right trigger finger    . seeds placed into prostate   2005  . TEE WITHOUT CARDIOVERSION N/A 06/12/2015   Procedure: TRANSESOPHAGEAL ECHOCARDIOGRAM (TEE) - WITH CARDIOVERSION;  Surgeon: Sanda Klein, MD;  Location: [place];  Service: Cardiovascular: EF 55-60%.No LA or LAA thrombus. Normal Peal PAP ~33 mmHg.  [name] TOTAL KNEE ARTHROPLASTY  05/09/2012   Procedure: TOTAL KNEE ARTHROPLASTY;  Surgeon: Kerin Salen, MD;  Location: [place];  Service: Orthopedics;  Laterality: Right;  . TRANSTHORACIC ECHOCARDIOGRAM  06/2015   EF 60-65%. Mild LA dilation. Normal PA pressures: 32 mmHg. Aortic sclerosis but no stenosis.  [name] wisdom teeth extraccted      Allergies  Allergen Reactions  . Crab [Shellfish Allergy] Rash  . Hydrocodone Hives  . Adhesive [Tape] Rash  . Oxycodone Rash    Allergies as of 04/09/2020      Reactions   Crab [shellfish Allergy] Rash   Hydrocodone Hives   Adhesive [tape] Rash   Oxycodone Rash      Medication List       Accurate as of April 09, 2020 11:59 PM. If you have any questions, ask your nurse or doctor.        acetaminophen 500 MG tablet Commonly known as: TYLENOL Take 1,000 mg by mouth. Three times a day as needed   atorvastatin 40 MG tablet Commonly known as: LIPITOR TAKE 1 TABLET DAILY AT BEDTIME   barrier cream Crea Commonly known as: non-specified Apply 1 application topically. Zinc Oxide to buttocks area when assisting resident with toileting for redness every shift   Biofreeze 4 % Gel Generic drug: Menthol (Topical Analgesic) Apply 1 application topically in the morning, at noon, in the evening, and at bedtime. Apply to right great toe   diltiazem 240 MG 24 hr capsule Commonly known as: DILACOR XR Take 240 mg by mouth daily. For heart failure   Eliquis 5 MG Tabs tablet Generic  drug: apixaban TAKE 1 TABLET TWICE A DAY (SCHEDULE FOLLOW UP WITH CARDIOLOGIST PRIOR TO NEXT REFILL REQUEST)   esomeprazole 40 MG capsule Commonly known as: NEXIUM TAKE 1 CAPSULE DAILY   ferrous sulfate 325 (65 FE) MG tablet Take 325 mg by mouth daily with breakfast. Mon, Wed, Fri   furosemide 40 MG tablet Commonly known as: LASIX Take 40 mg by mouth daily.   losartan 50 MG tablet Commonly known as: COZAAR Take 50 mg by mouth daily.   magnesium oxide 400 MG tablet Commonly known as: MAG-OX Take 200 mg by mouth daily. 1/2 tablet   MIRALAX PO Take 17 g by mouth daily.   Myrbetriq 25 MG Tb24 tablet Generic drug: mirabegron ER Take 25 mg by mouth daily.   Nitrostat 0.4 MG SL tablet Generic drug: nitroGLYCERIN USE AS NEEDED  nystatin powder Commonly known as: MYCOSTATIN/NYSTOP Apply 1 application topically. Apply to back, R/L buttocks, groin, and under breasts Twice A Day   nystatin cream Commonly known as: MYCOSTATIN Apply 1 application topically 2 (two) times daily as needed for dry skin. Apply to corners of mouth rash   OXYGEN Inhale 2 L into the lungs continuous. For obstructive sleep apnea   PreviDent 5000 Booster Plus 1.1 % Pste Generic drug: Sodium Fluoride Place 1 application onto teeth at bedtime.   tamsulosin 0.4 MG Caps capsule Commonly known as: FLOMAX Take 0.4 mg by mouth daily.   triamcinolone cream 0.1 % Commonly known as: KENALOG Apply 1 application topically 2 (two) times daily as needed. Apply to corners of mouth rash   Vitamin D3 25 MCG (1000 UT) Caps Take 1 capsule by mouth daily.       Review of Systems  Constitutional: Negative for activity change, fever and unexpected weight change.  HENT: Positive for hearing loss. Negative for congestion and voice change.   Eyes: Negative for visual disturbance.  Respiratory: Positive for shortness of breath. Negative for cough and wheezing.        O2 dependent. DOE  Cardiovascular: Positive  for leg swelling.  Gastrointestinal: Negative for abdominal pain and constipation.       Incontinent of feces. Had BM this morning  Genitourinary: Negative for dysuria and urgency.       Incontinent of urine.   Musculoskeletal: Positive for arthralgias and gait problem.       Left knee pain is chronic. The right great toe pain at the base of the toe nail, but no warmth, redness, or swelling.   Skin: Negative for color change.  Neurological: Negative for speech difficulty, weakness, light-headedness and headaches.       Memory lapses.   Psychiatric/Behavioral: Negative for behavioral problems and sleep disturbance. The patient is not nervous/anxious.     Immunization History  Administered Date(s) Administered  . H1N1 04/13/2010  . Influenza, High Dose Seasonal PF 02/12/2014, 03/29/2018, 01/05/2019  . Influenza-Unspecified 03/06/2013, 02/12/2014, 03/06/2017  . Moderna SARS-COVID-2 Vaccination 06/08/2019, 07/06/2019  . Pneumococcal Conjugate-13 02/12/2014  . Pneumococcal Polysaccharide-23 02/04/2013, 02/12/2014  . Td 09/04/2005  . Tdap 12/11/2012  . Zoster 07/07/2006   Pertinent  Health Maintenance Due  Topic Date Due  . INFLUENZA VACCINE  09/03/2020 (Originally 01/05/2020)  . PNA vac Low Risk Adult  Completed   No flowsheet data found. Functional Status Survey:    Vitals:   04/09/20 1608  BP: 120/75  Pulse: 69  Resp: (!) 22  Temp: 98.1 F (36.7 C)  SpO2: 91%  Weight: 216 lb 3.2 oz (98.1 kg)  Height: 5\' 7"  (1.702 m)   Body mass index is 33.86 kg/m. Physical Exam Vitals and nursing note reviewed.  Constitutional:      Appearance: Normal appearance.  HENT:     Head: Normocephalic and atraumatic.     Nose: Nose normal.     Mouth/Throat:     Mouth: Mucous membranes are moist.  Eyes:     Extraocular Movements: Extraocular movements intact.     Conjunctiva/sclera: Conjunctivae normal.     Pupils: Pupils are equal, round, and reactive to light.  Cardiovascular:      Rate and Rhythm: Normal rate. Rhythm irregular.     Heart sounds: No murmur heard.      Comments: DP pulses not felt prior Pulmonary:     Breath sounds: No wheezing.     Comments: O2 dependent.  Abdominal:     General: Bowel sounds are normal.     Palpations: Abdomen is soft.     Tenderness: There is no abdominal tenderness.  Musculoskeletal:     Cervical back: Normal range of motion and neck supple.     Right lower leg: Edema present.     Left lower leg: Edema present.     Comments: Trace edema BLE., less than prior, near none.  Mechanical lift for transfer.   Skin:    General: Skin is warm and dry.  Neurological:     General: No focal deficit present.     Mental Status: He is alert and oriented to person, place, and time. Mental status is at baseline.     Motor: No weakness.     Coordination: Coordination normal.     Gait: Gait abnormal.  Psychiatric:        Mood and Affect: Mood normal.        Behavior: Behavior normal.        Thought Content: Thought content normal.        Judgment: Judgment normal.     Labs reviewed: Recent Labs    04/30/19 0000 08/27/19 0000 10/02/19 0000 04/01/20 0000  NA 137  --  138 140  K 4.2  --  3.7 4.0  CL 99  --  100 100  CO2 29*  --  30* 32*  BUN 20  --  13 19  CREATININE 0.9  --  0.7 0.9  CALCIUM 8.5*  --  8.8 9.2  MG  --  1.5 1.5  --    Recent Labs    04/30/19 0000 10/02/19 0000 04/01/20 0000  AST 9* 9* 10*  ALT 4* 4* 4*  ALKPHOS 77 75 72  ALBUMIN  --  3.3* 3.5   Recent Labs    04/30/19 0000 04/30/19 0000 10/02/19 0000 02/18/20 0000 04/01/20 0000  WBC 9.1   < > 8.9 8.9 8.6  NEUTROABS 6,224  --  6,070  --   --   HGB 10.7*   < > 12.9* 14.0 14.0  HCT 34*   < > 37* 40* 39*  PLT 285   < > 200 217 189   < > = values in this interval not displayed.   Lab Results  Component Value Date   TSH 2.58 10/02/2019   No results found for: HGBA1C Lab Results  Component Value Date   CHOL 109 10/02/2019   HDL 40 10/02/2019     LDLCALC 52 10/02/2019   TRIG 91 10/02/2019   CHOLHDL 2.9 02/12/2014    Significant Diagnostic Results in last 30 days:  No results found.  Assessment/Plan  Diastolic CHF (HCC) CHF/minimal edema BLE, on Furosamide 40mg  qd.   Essential hypertension HTN, blood pressure is controlled on Losartan 50mg  qd, Diltiazem 240mg  qd, Furosemide 40mg  qd.     Persistent atrial fibrillation ([place]):  CHA2DS2-VASc Score 4; On Eliquis Afib, heart rate is in control, on Diltiazem 240mg  qd, Eliquis 5mg  bid.    ILD (interstitial lung disease) (HCC) Hx of Interstitial lung disease, O2 dependent  GERD (gastroesophageal reflux disease) GERD, stable, on Esomeprazole 40mg  qd.   Iron deficiency anemia Anemia, stable, on Fe. Hgb 14 03/31/20  Urinary incontinence Urinary incontinent, no urinary retention, on Mirabegron 25mg  qd, Tamsulosin 0.4mg  qd.   Constipation Constipation, stable, on MiraLax qd.   Osteoarthritis of right knee OA, uses mechanical lift for transfer, takes prn Tylenol.    Anxiety Stable, off Alprazolam.  Family/ staff Communication: plan of care reviewed with the patient and charge nurse.   Labs/tests ordered: none  Time spend 35 minutes.

## 2020-04-09 NOTE — Assessment & Plan Note (Signed)
Afib, heart rate is in control, on Diltiazem 240mg  qd, Eliquis 5mg  bid.

## 2020-04-09 NOTE — Assessment & Plan Note (Signed)
Hx of Interstitial lung disease, O2 dependent    °

## 2020-04-09 NOTE — Assessment & Plan Note (Signed)
Urinary incontinent, no urinary retention, on Mirabegron 25mg qd, Tamsulosin 0.4mg qd.  

## 2020-04-10 ENCOUNTER — Encounter: Payer: Self-pay | Admitting: Nurse Practitioner

## 2020-05-06 ENCOUNTER — Non-Acute Institutional Stay (SKILLED_NURSING_FACILITY): Payer: Medicare Other | Admitting: Nurse Practitioner

## 2020-05-06 DIAGNOSIS — I5030 Unspecified diastolic (congestive) heart failure: Secondary | ICD-10-CM

## 2020-05-06 DIAGNOSIS — I10 Essential (primary) hypertension: Secondary | ICD-10-CM | POA: Diagnosis not present

## 2020-05-06 DIAGNOSIS — R32 Unspecified urinary incontinence: Secondary | ICD-10-CM | POA: Diagnosis not present

## 2020-05-06 DIAGNOSIS — K219 Gastro-esophageal reflux disease without esophagitis: Secondary | ICD-10-CM

## 2020-05-06 DIAGNOSIS — K5900 Constipation, unspecified: Secondary | ICD-10-CM

## 2020-05-06 DIAGNOSIS — F419 Anxiety disorder, unspecified: Secondary | ICD-10-CM

## 2020-05-06 DIAGNOSIS — M1711 Unilateral primary osteoarthritis, right knee: Secondary | ICD-10-CM | POA: Diagnosis not present

## 2020-05-06 DIAGNOSIS — I4819 Other persistent atrial fibrillation: Secondary | ICD-10-CM | POA: Diagnosis not present

## 2020-05-06 DIAGNOSIS — J849 Interstitial pulmonary disease, unspecified: Secondary | ICD-10-CM | POA: Diagnosis not present

## 2020-05-06 DIAGNOSIS — D508 Other iron deficiency anemias: Secondary | ICD-10-CM

## 2020-05-06 NOTE — Assessment & Plan Note (Signed)
Afib, heart rate is in control, on Diltiazem 240mg  qd, Eliquis 5mg  bid.

## 2020-05-06 NOTE — Assessment & Plan Note (Signed)
Hx of Interstitial lung disease, O2 dependent    °

## 2020-05-06 NOTE — Progress Notes (Addendum)
Location:   SNF [place] Room Number: 5 Place of Service:  SNF ([phone] [facility] Provider: [facility] Asuncion Tapscott NP  Virgie Dad, MD  Patient Care Team: Virgie Dad, MD as PCP - General (Internal Medicine) Leonie Constantina Laseter, MD as PCP - Cardiology (Cardiology)  Extended Emergency Contact Information Primary Emergency Contact: [facility] Address: [address]          [place], [place] [address] [name] of [place] Phone: [phone] Mobile Phone: [phone] Relation: Spouse Secondary Emergency Contact: Janey Genta Address: [address]          [place], [place] of [place] Phone: [phone] Mobile Phone: [phone] Relation: Daughter  Code Status:  DNR Goals of care: Advanced Directive information Advanced Directives 02/12/2020  Does Patient Have a Medical Advance Directive? Yes  Type of Paramedic of [place];Living will  Does patient want to make changes to medical advance directive? No - Patient declined  Copy of [place] in Chart? Yes - validated most recent copy scanned in chart (See row information)  Would patient like information on creating a medical advance directive? -  Pre-existing out of facility DNR order (yellow form or pink MOST form) -     Chief Complaint  Patient presents with  . Medical Management of Chronic Issues    HPI:  Pt is a 86 y.o. male seen today for medical management of chronic diseases.    Urinary incontinent, no urinary retention, on Mirabegron 25mg  qd, Tamsulosin 0.4mg  qd. Constipation, stable, on MiraLax qd. OA, uses mechanical lift for transfer, takes prn Tylenol.  Hx of Interstitial lung disease, O2 dependent CHF/minimal edema BLE, on Furosamide 40mg  qd. Bun/creat 10/0.87 03/31/20 Anxiety,stable.Off alprazolam. Afib, heart rate is in control, on Diltiazem 240mg  qd,  Eliquis 5mg  bid.  GERD, stable, on Esomeprazole 40mg  qd.  Anemia, stable, on Fe. Hgb 14.0 03/31/20 HTN, blood pressure is controlled on Losartan 50mg  qd, Diltiazem 240mg  qd, Furosemide 40mg  qd.  Past Medical History:  Diagnosis Date  . Anxiety   . CAD S/P percutaneous coronary angioplasty 1996, 2009, 01/2010   PTCA of L Cx 1996; BMS-RCA [facility]) 2009; Staged PCI RCA (70% ISR) --> 3.0 mm x 23 mm BMS, staged PCI Diag - 2.0 mm x 12 mm Mini Vision BMS (2.25 mm)  . Cataracts, bilateral    immature  . Chronic back pain    scoliosis--pt states unable to have surgery bc of age  . GERD (gastroesophageal reflux disease)    takes Nexium daily  . History of colon polyps   . Hyperlipidemia   . Hypertension   . Myocardial infarction [facility]) 1996, 2009   x 2;last time was about 8-64yrs ago   . Obesity (BMI 30.0-34.9)   . OSA (obstructive sleep apnea)   . Osteoarthritis of both knees   . Peripheral edema    Venous Stasis  . Persistent atrial fibrillation (HCC)    Unable to maintaine NSR after DCCV with Tikosyn.  Plan = rate control w/diltiazem (beta blocker off b/c fatigue). CHA2DS2Vasc 4. DOAC - Eliquis.   . Prostate cancer ([place]) 2005   seed implant   Past Surgical History:  Procedure Laterality Date  . CARDIOVERSION N/A 06/12/2015   Procedure: CARDIOVERSION;  Surgeon: Sanda Klein, MD;  Location: [place] [facility];  Service: Cardiovascular;  Laterality: N/A;  . CARDIOVERSION N/A 08/02/2015   Procedure: CARDIOVERSION;  Surgeon: [name], MD;  Location: [place];  Service: Cardiovascular;  Laterality: N/A;  .  CHOLECYSTECTOMY    . COLONOSCOPY    . CORONARY ANGIOPLASTY WITH STENT PLACEMENT  1996, 2009, August 2011   PTCA of L Cx 1996; BMS-RCA [facility]) 2009; Staged PCI RCA (70% ISR) --> 3.0 mm x 23 mm BMS, staged PCI Diag - 2.0 mm x 12 mm Mini Vision BMS (2.25 mm)  . cyst removed  in college  . NEUROPLASTY / TRANSPOSITION MEDIAN NERVE AT CARPAL TUNNEL BILATERAL    .  NM MYOVIEW LTD  November 2013   EF 53%; fixed mid inferior-inferolateral defect, infarct with no ischemia.  . right knee arthrsoscopy  25yrs ago  . right trigger finger    . seeds placed into prostate   2005  . TEE WITHOUT CARDIOVERSION N/A 06/12/2015   Procedure: TRANSESOPHAGEAL ECHOCARDIOGRAM (TEE) - WITH CARDIOVERSION;  Surgeon: Sanda Klein, MD;  Location: [place];  Service: Cardiovascular: EF 55-60%.No LA or LAA thrombus. Normal Peal PAP ~33 mmHg.  [name] TOTAL KNEE ARTHROPLASTY  05/09/2012   Procedure: TOTAL KNEE ARTHROPLASTY;  Surgeon: Kerin Salen, MD;  Location: [place];  Service: Orthopedics;  Laterality: Right;  . TRANSTHORACIC ECHOCARDIOGRAM  06/2015   EF 60-65%. Mild LA dilation. Normal PA pressures: 32 mmHg. Aortic sclerosis but no stenosis.  [name] wisdom teeth extraccted      Allergies  Allergen Reactions  . Crab [Shellfish Allergy] Rash  . Hydrocodone Hives  . Adhesive [Tape] Rash  . Oxycodone Rash    Allergies as of 05/06/2020      Reactions   Crab [shellfish Allergy] Rash   Hydrocodone Hives   Adhesive [tape] Rash   Oxycodone Rash      Medication List       Accurate as of May 06, 2020 11:59 PM. If you have any questions, ask your nurse or doctor.        acetaminophen 500 MG tablet Commonly known as: TYLENOL Take 1,000 mg by mouth. Three times a day as needed   atorvastatin 40 MG tablet Commonly known as: LIPITOR TAKE 1 TABLET DAILY AT BEDTIME   barrier cream Crea Commonly known as: non-specified Apply 1 application topically. Zinc Oxide to buttocks area when assisting resident with toileting for redness every shift   Biofreeze 4 % Gel Generic drug: Menthol (Topical Analgesic) Apply 1 application topically in the morning, at noon, in the evening, and at bedtime. Apply to right great toe   diltiazem 240 MG 24 hr capsule Commonly known as: DILACOR XR Take 240 mg by mouth daily. For heart failure   Eliquis 5 MG Tabs tablet Generic drug:  apixaban TAKE 1 TABLET TWICE A DAY (SCHEDULE FOLLOW UP WITH CARDIOLOGIST PRIOR TO NEXT REFILL REQUEST)   esomeprazole 40 MG capsule Commonly known as: NEXIUM TAKE 1 CAPSULE DAILY   ferrous sulfate 325 (65 FE) MG tablet Take 325 mg by mouth daily with breakfast. Mon, Wed, Fri   furosemide 40 MG tablet Commonly known as: LASIX Take 40 mg by mouth daily.   losartan 50 MG tablet Commonly known as: COZAAR Take 50 mg by mouth daily.   magnesium oxide 400 MG tablet Commonly known as: MAG-OX Take 200 mg by mouth daily. 1/2 tablet   MIRALAX PO Take 17 g by mouth daily.   Myrbetriq 25 MG Tb24 tablet Generic drug: mirabegron ER Take 25 mg by mouth daily.   Nitrostat 0.4 MG SL tablet Generic drug: nitroGLYCERIN USE AS NEEDED   nystatin powder Commonly known as: MYCOSTATIN/NYSTOP Apply 1 application topically. Apply to back, R/L buttocks, groin,  and under breasts Twice A Day   nystatin cream Commonly known as: MYCOSTATIN Apply 1 application topically 2 (two) times daily as needed for dry skin. Apply to corners of mouth rash   OXYGEN Inhale 2 L into the lungs continuous. For obstructive sleep apnea   PreviDent 5000 Booster Plus 1.1 % Pste Generic drug: Sodium Fluoride Place 1 application onto teeth at bedtime.   tamsulosin 0.4 MG Caps capsule Commonly known as: FLOMAX Take 0.4 mg by mouth daily.   triamcinolone 0.1 % Commonly known as: KENALOG Apply 1 application topically 2 (two) times daily as needed. Apply to corners of mouth rash   Vitamin D3 25 MCG (1000 UT) Caps Take 1 capsule by mouth daily.       Review of Systems  Constitutional: Negative for activity change, fever and unexpected weight change.  HENT: Positive for hearing loss. Negative for congestion and voice change.   Eyes: Negative for visual disturbance.  Respiratory: Positive for shortness of breath. Negative for cough and wheezing.        O2 dependent. DOE  Cardiovascular: Negative for leg  swelling.  Gastrointestinal: Negative for abdominal pain and constipation.       Incontinent of feces. Had BM this morning  Genitourinary: Negative for dysuria and urgency.       Incontinent of urine.   Musculoskeletal: Positive for arthralgias and gait problem.       Left knee pain is chronic. The right great toe pain at the base of the toe nail, but no warmth, redness, or swelling.   Skin: Negative for color change.  Neurological: Negative for speech difficulty, weakness, light-headedness and headaches.       Memory lapses.   Psychiatric/Behavioral: Negative for behavioral problems and sleep disturbance. The patient is not nervous/anxious.     Immunization History  Administered Date(s) Administered  . H1N1 04/13/2010  . Influenza, High Dose Seasonal PF 02/12/2014, 03/29/2018, 01/05/2019  . Influenza-Unspecified 03/06/2013, 02/12/2014, 03/06/2017  . Moderna SARS-COVID-2 Vaccination 06/08/2019, 07/06/2019  . Pneumococcal Conjugate-13 02/12/2014  . Pneumococcal Polysaccharide-23 02/04/2013, 02/12/2014  . Td 09/04/2005  . Tdap 12/11/2012  . Zoster 07/07/2006   Pertinent  Health Maintenance Due  Topic Date Due  . INFLUENZA VACCINE  09/03/2020 (Originally 01/05/2020)  . PNA vac Low Risk Adult  Completed   No flowsheet data found. Functional Status Survey:    Vitals:   05/06/20 1626  BP: 128/80  Pulse: (!) 19  Resp: 19  Temp: 98 F (36.7 C)  SpO2: 96%   There is no height or weight on file to calculate BMI. Physical Exam Vitals and nursing note reviewed.  Constitutional:      Appearance: Normal appearance.  HENT:     Head: Normocephalic and atraumatic.     Nose: Nose normal.     Mouth/Throat:     Mouth: Mucous membranes are moist.  Eyes:     Extraocular Movements: Extraocular movements intact.     Conjunctiva/sclera: Conjunctivae normal.     Pupils: Pupils are equal, round, and reactive to light.  Cardiovascular:     Rate and Rhythm: Normal rate. Rhythm irregular.      Heart sounds: No murmur heard.      Comments: DP pulses not felt R, right toe pain is chronic.  DP pulse felt L Pulmonary:     Breath sounds: No wheezing.     Comments: O2 dependent.  Abdominal:     General: Bowel sounds are normal.     Palpations: Abdomen  is soft.     Tenderness: There is no abdominal tenderness.  Musculoskeletal:     Cervical back: Normal range of motion and neck supple.     Right lower leg: No edema.     Left lower leg: No edema.     Comments: Trace edema BLE., less than prior, near none.  Mechanical lift for transfer.   Skin:    General: Skin is warm and dry.  Neurological:     General: No focal deficit present.     Mental Status: He is alert and oriented to person, place, and time. Mental status is at baseline.     Motor: No weakness.     Coordination: Coordination normal.     Gait: Gait abnormal.  Psychiatric:        Mood and Affect: Mood normal.        Behavior: Behavior normal.        Thought Content: Thought content normal.        Judgment: Judgment normal.     Labs reviewed: Recent Labs    08/27/19 0000 10/02/19 0000 04/01/20 0000  NA  --  138 140  K  --  3.7 4.0  CL  --  100 100  CO2  --  30* 32*  BUN  --  13 19  CREATININE  --  0.7 0.9  CALCIUM  --  8.8 9.2  MG 1.5 1.5  --    Recent Labs    10/02/19 0000 04/01/20 0000  AST 9* 10*  ALT 4* 4*  ALKPHOS 75 72  ALBUMIN 3.3* 3.5   Recent Labs    10/02/19 0000 02/18/20 0000 04/01/20 0000  WBC 8.9 8.9 8.6  NEUTROABS 6,070  --   --   HGB 12.9* 14.0 14.0  HCT 37* 40* 39*  PLT 200 217 189   Lab Results  Component Value Date   TSH 2.58 10/02/2019   No results found for: HGBA1C Lab Results  Component Value Date   CHOL 109 10/02/2019   HDL 40 10/02/2019   LDLCALC 52 10/02/2019   TRIG 91 10/02/2019   CHOLHDL 2.9 02/12/2014    Significant Diagnostic Results in last 30 days:  No results found.  Assessment/Plan  Constipation Constipation, stable, on MiraLax  qd.  Osteoarthritis of right knee OA, uses mechanical lift for transfer, takes prn Tylenol.    Urinary incontinence Urinary incontinent, no urinary retention, on Mirabegron 25mg  qd, Tamsulosin 0.4mg  qd.   ILD (interstitial lung disease) (HCC) Hx of Interstitial lung disease, O2 dependent   Diastolic CHF (HCC) CHF/minimal edema BLE, on Furosamide 40mg  qd. Bun/creat 10/0.87 03/31/20   Anxiety Anxiety,stable.Off alprazolam.   Persistent atrial fibrillation ([place]):  CHA2DS2-VASc Score 4; On Eliquis Afib, heart rate is in control, on Diltiazem 240mg  qd, Eliquis 5mg  bid.    GERD (gastroesophageal reflux disease) GERD, stable, on Esomeprazole 40mg  qd.   Iron deficiency anemia Anemia, stable, on Fe. Hgb 14.0 03/31/20  Essential hypertension HTN, blood pressure is controlled on Losartan 50mg  qd, Diltiazem 240mg  qd, Furosemide 40mg  qd.   Family/ staff Communication: plan of care reviewed with the patient and charge nurse.   Labs/tests ordered: none  Time spend 35 minutes.

## 2020-05-06 NOTE — Assessment & Plan Note (Signed)
Constipation, stable, on MiraLax qd.

## 2020-05-06 NOTE — Assessment & Plan Note (Signed)
CHF/minimal edema BLE, on Furosamide 40mg  qd. Bun/creat 10/0.87 03/31/20

## 2020-05-06 NOTE — Assessment & Plan Note (Signed)
Urinary incontinent, no urinary retention, on Mirabegron 25mg qd, Tamsulosin 0.4mg qd.  

## 2020-05-06 NOTE — Assessment & Plan Note (Signed)
OA, uses mechanical lift for transfer, takes prn Tylenol.

## 2020-05-06 NOTE — Assessment & Plan Note (Signed)
Anemia, stable, on Fe. Hgb 14.0 03/31/20

## 2020-05-06 NOTE — Assessment & Plan Note (Signed)
Anxiety, stable. Off alprazolam.  

## 2020-05-06 NOTE — Assessment & Plan Note (Signed)
GERD, stable, on Esomeprazole 40mg  qd.

## 2020-05-06 NOTE — Assessment & Plan Note (Signed)
HTN, blood pressure is controlled on Losartan 50mg  qd, Diltiazem 240mg  qd, Furosemide 40mg  qd.

## 2020-05-07 ENCOUNTER — Encounter: Payer: Self-pay | Admitting: Nurse Practitioner

## 2020-05-19 ENCOUNTER — Encounter: Payer: Self-pay | Admitting: Internal Medicine

## 2020-05-19 ENCOUNTER — Non-Acute Institutional Stay (SKILLED_NURSING_FACILITY): Payer: Medicare Other | Admitting: Internal Medicine

## 2020-05-19 DIAGNOSIS — L719 Rosacea, unspecified: Secondary | ICD-10-CM

## 2020-05-19 NOTE — Progress Notes (Signed)
Location:   [place] Room Number: 5 Place of Service:  SNF [phone]) Provider:  Veleta Miners MD  Virgie Dad, MD  Patient Care Team: Virgie Dad, MD as PCP - General (Internal Medicine) Leonie Man, MD as PCP - Cardiology (Cardiology)  Extended Emergency Contact Information Primary Emergency Contact: [facility] Address: [address]          [place], [place] [address] [name] of [place] Phone: [phone] Mobile Phone: [phone] Relation: Spouse Secondary Emergency Contact: Janey Genta Address: [address]          [place], [place] of [place] Phone: [phone] Mobile Phone: [phone] Relation: Daughter  Code Status:  Full Code Goals of care: Advanced Directive information Advanced Directives 02/12/2020  Does Patient Have a Medical Advance Directive? Yes  Type of Paramedic of [place];Living will  Does patient want to make changes to medical advance directive? No - Patient declined  Copy of [place] in Chart? Yes - validated most recent copy scanned in chart (See row information)  Would patient like information on creating a medical advance directive? -  Pre-existing out of facility DNR order (yellow form or pink MOST form) -     Chief Complaint  Patient presents with  . Acute Visit    Facial rash    HPI:  Pt is a 86 y.o. male seen today for an acute visit for Facial Rash on Left side of his face from Forehead Cheeks and Going down to his neck with Itching   Patient has a history of CAD s/p PTCA, chronic atrial fibrillation on Eliquis, chronic diastolic CHF,Interstitial lung disease with hypoxia on chronic oxygen, hypertension, hyperlipidemia, OSA on CPAP, chronic back pain with spinal stenosis, S C-spine kyphosis, cognitive impairment  Rash Noticed few days ago now spreading to Right side also C/o Scratching and Itching Not tender No New  Creams or Soap He stays in the room and does not go out in [place]  Past Medical History:  Diagnosis Date  . Anxiety   . CAD S/P percutaneous coronary angioplasty 1996, 2009, 01/2010   PTCA of L Cx 1996; BMS-RCA [facility]) 2009; Staged PCI RCA (70% ISR) --> 3.0 mm x 23 mm BMS, staged PCI Diag - 2.0 mm x 12 mm Mini Vision BMS (2.25 mm)  . Cataracts, bilateral    immature  . Chronic back pain    scoliosis--pt states unable to have surgery bc of age  . GERD (gastroesophageal reflux disease)    takes Nexium daily  . History of colon polyps   . Hyperlipidemia   . Hypertension   . Myocardial infarction [facility]) 1996, 2009   x 2;last time was about 8-38yrs ago   . Obesity (BMI 30.0-34.9)   . OSA (obstructive sleep apnea)   . Osteoarthritis of both knees   . Peripheral edema    Venous Stasis  . Persistent atrial fibrillation (HCC)    Unable to maintaine NSR after DCCV with Tikosyn.  Plan = rate control w/diltiazem (beta blocker off b/c fatigue). CHA2DS2Vasc 4. DOAC - Eliquis.   . Prostate cancer ([place]) 2005   seed implant   Past Surgical History:  Procedure Laterality Date  . CARDIOVERSION N/A 06/12/2015   Procedure: CARDIOVERSION;  Surgeon: Sanda Klein, MD;  Location: [place] [facility];  Service: Cardiovascular;  Laterality: N/A;  . CARDIOVERSION N/A 08/02/2015   Procedure: CARDIOVERSION;  Surgeon: [name], MD;  Location: [place];  Service:  Cardiovascular;  Laterality: N/A;  . CHOLECYSTECTOMY    . COLONOSCOPY    . CORONARY ANGIOPLASTY WITH STENT PLACEMENT  1996, 2009, August 2011   PTCA of L Cx 1996; BMS-RCA [facility]) 2009; Staged PCI RCA (70% ISR) --> 3.0 mm x 23 mm BMS, staged PCI Diag - 2.0 mm x 12 mm Mini Vision BMS (2.25 mm)  . cyst removed  in college  . NEUROPLASTY / TRANSPOSITION MEDIAN NERVE AT CARPAL TUNNEL BILATERAL    . NM MYOVIEW LTD  November 2013   EF 53%; fixed mid inferior-inferolateral defect, infarct with no ischemia.  . right knee arthrsoscopy  3yrs ago  . right trigger  finger    . seeds placed into prostate   2005  . TEE WITHOUT CARDIOVERSION N/A 06/12/2015   Procedure: TRANSESOPHAGEAL ECHOCARDIOGRAM (TEE) - WITH CARDIOVERSION;  Surgeon: Sanda Klein, MD;  Location: [place];  Service: Cardiovascular: EF 55-60%.No LA or LAA thrombus. Normal Peal PAP ~33 mmHg.  [name] TOTAL KNEE ARTHROPLASTY  05/09/2012   Procedure: TOTAL KNEE ARTHROPLASTY;  Surgeon: Kerin Salen, MD;  Location: [place];  Service: Orthopedics;  Laterality: Right;  . TRANSTHORACIC ECHOCARDIOGRAM  06/2015   EF 60-65%. Mild LA dilation. Normal PA pressures: 32 mmHg. Aortic sclerosis but no stenosis.  [name] wisdom teeth extraccted      Allergies  Allergen Reactions  . Crab [Shellfish Allergy] Rash  . Hydrocodone Hives  . Adhesive [Tape] Rash  . Oxycodone Rash    Allergies as of 05/19/2020      Reactions   Crab [shellfish Allergy] Rash   Hydrocodone Hives   Adhesive [tape] Rash   Oxycodone Rash      Medication List       Accurate as of May 19, 2020 11:24 AM. If you have any questions, ask your nurse or doctor.        acetaminophen 500 MG tablet Commonly known as: TYLENOL Take 1,000 mg by mouth. Three times a day as needed   atorvastatin 40 MG tablet Commonly known as: LIPITOR TAKE 1 TABLET DAILY AT BEDTIME   barrier cream Crea Commonly known as: non-specified Apply 1 application topically. Zinc Oxide to buttocks area when assisting resident with toileting for redness every shift   Biofreeze 4 % Gel Generic drug: Menthol (Topical Analgesic) Apply 1 application topically in the morning, at noon, in the evening, and at bedtime. Apply to right great toe   diltiazem 240 MG 24 hr capsule Commonly known as: DILACOR XR Take 240 mg by mouth daily. For heart failure   Eliquis 5 MG Tabs tablet Generic drug: apixaban TAKE 1 TABLET TWICE A DAY (SCHEDULE FOLLOW UP WITH CARDIOLOGIST PRIOR TO NEXT REFILL REQUEST)   esomeprazole 40 MG capsule Commonly known as: NEXIUM TAKE 1  CAPSULE DAILY   ferrous sulfate 325 (65 FE) MG tablet Take 325 mg by mouth daily with breakfast. Mon, Wed, Fri   furosemide 40 MG tablet Commonly known as: LASIX Take 40 mg by mouth daily.   loratadine 10 MG tablet Commonly known as: CLARITIN Take 10 mg by mouth daily as needed for allergies.   losartan 50 MG tablet Commonly known as: COZAAR Take 50 mg by mouth daily.   magnesium oxide 400 MG tablet Commonly known as: MAG-OX Take 200 mg by mouth daily. 1/2 tablet   mirabegron ER 25 MG Tb24 tablet Commonly known as: MYRBETRIQ Take 25 mg by mouth daily.   MIRALAX PO Take 17 g by mouth daily.   Nitrostat 0.4  MG SL tablet Generic drug: nitroGLYCERIN USE AS NEEDED   nystatin powder Commonly known as: MYCOSTATIN/NYSTOP Apply 1 application topically. Apply to back, R/L buttocks, groin, and under breasts Twice A Day   nystatin cream Commonly known as: MYCOSTATIN Apply 1 application topically 2 (two) times daily as needed for dry skin. Apply to corners of mouth rash   OXYGEN Inhale 2 L into the lungs continuous. For obstructive sleep apnea   PreviDent 5000 Booster Plus 1.1 % Pste Generic drug: Sodium Fluoride Place 1 application onto teeth at bedtime.   tamsulosin 0.4 MG Caps capsule Commonly known as: FLOMAX Take 0.4 mg by mouth daily.   triamcinolone 0.1 % Commonly known as: KENALOG Apply 1 application topically 2 (two) times daily as needed. Apply to corners of mouth rash   Vitamin D3 25 MCG (1000 UT) Caps Take 1 capsule by mouth daily.       Review of Systems  Constitutional: Negative.   HENT: Negative.   Respiratory: Positive for shortness of breath.   Cardiovascular: Positive for leg swelling.  Gastrointestinal: Negative.   Genitourinary: Negative.   Musculoskeletal: Positive for gait problem.  Skin: Positive for rash.  Neurological: Positive for weakness.  Psychiatric/Behavioral: Positive for sleep disturbance.    Immunization History   Administered Date(s) Administered  . H1N1 04/13/2010  . Influenza, High Dose Seasonal PF 02/12/2014, 03/29/2018, 01/05/2019  . Influenza-Unspecified 03/06/2013, 02/12/2014, 03/06/2017  . Moderna Sars-Covid-2 Vaccination 06/08/2019, 07/06/2019  . Pneumococcal Conjugate-13 02/12/2014  . Pneumococcal Polysaccharide-23 02/04/2013, 02/12/2014  . Td 09/04/2005  . Tdap 12/11/2012  . Zoster 07/07/2006   Pertinent  Health Maintenance Due  Topic Date Due  . INFLUENZA VACCINE  09/03/2020 (Originally 01/05/2020)  . PNA vac Low Risk Adult  Completed   No flowsheet data found. Functional Status Survey:    Vitals:   05/19/20 1055  BP: 122/84  Pulse: 70  Resp: 19  Temp: (!) 97.1 F (36.2 C)  SpO2: 96%  Weight: 211 lb 3.2 oz (95.8 kg)  Height: 5\' 7"  (1.702 m)   Body mass index is 33.08 kg/m. Physical Exam Vitals reviewed.  Constitutional:      Appearance: He is obese.  HENT:     Head: Normocephalic.     Nose: Nose normal.     Mouth/Throat:     Mouth: Mucous membranes are moist.     Pharynx: Oropharynx is clear.  Eyes:     Pupils: Pupils are equal, round, and reactive to light.  Cardiovascular:     Rate and Rhythm: Normal rate.     Pulses: Normal pulses.  Pulmonary:     Effort: Pulmonary effort is normal.     Breath sounds: Normal breath sounds.  Abdominal:     General: Abdomen is flat. Bowel sounds are normal.     Palpations: Abdomen is soft.  Musculoskeletal:        General: Swelling present.     Cervical back: Neck supple.  Skin:    Comments: MaculoPapular rash in Left side of face More on the cheeks but going down to Neck and Other side of face  Neurological:     General: No focal deficit present.     Mental Status: He is alert and oriented to person, place, and time.  Psychiatric:        Mood and Affect: Mood normal.        Thought Content: Thought content normal.     Labs reviewed: Recent Labs    08/27/19 0000 10/02/19 0000  04/01/20 0000  NA  --  138 140   K  --  3.7 4.0  CL  --  100 100  CO2  --  30* 32*  BUN  --  13 19  CREATININE  --  0.7 0.9  CALCIUM  --  8.8 9.2  MG 1.5 1.5  --    Recent Labs    10/02/19 0000 04/01/20 0000  AST 9* 10*  ALT 4* 4*  ALKPHOS 75 72  ALBUMIN 3.3* 3.5   Recent Labs    10/02/19 0000 02/18/20 0000 04/01/20 0000  WBC 8.9 8.9 8.6  NEUTROABS 6,070  --   --   HGB 12.9* 14.0 14.0  HCT 37* 40* 39*  PLT 200 217 189   Lab Results  Component Value Date   TSH 2.58 10/02/2019   No results found for: HGBA1C Lab Results  Component Value Date   CHOL 109 10/02/2019   HDL 40 10/02/2019   LDLCALC 52 10/02/2019   TRIG 91 10/02/2019   CHOLHDL 2.9 02/12/2014    Significant Diagnostic Results in last 30 days:  No results found.  Assessment/Plan Rosacea Metronidazole 0.75% BID with Triamcinolone 0.025% BID  Other issues   Diastolic congestive heart failure, unspecified HF chronicity (HCC) On Lasix  Doing well  Persistent atrial fibrillation ([place]):  CHA2DS2-VASc Score 4; On Eliquis On Eliquis ILD (interstitial lung disease) ([place]) Continue Oxygen  Essential hypertension Stable on Cozaar and Cardizem Physical deconditioning Refuses Therapy OSA (obstructive sleep apnea) Tolerating CPAP Hyperlipidemia with target LDL less than 70 LDL at target on Lipitor CAD S/P percutaneous coronary angioplasty On Statin and Eliquis Urinary Incontinence On Myrbetriq and Flomax  Iron Def Anemia Last hgb normal on Low dose of Iron Anxiety On Low dose of PRN xanax Family/ staff Communication:   Labs/tests ordered:

## 2020-06-15 ENCOUNTER — Non-Acute Institutional Stay (SKILLED_NURSING_FACILITY): Payer: Medicare Other | Admitting: Internal Medicine

## 2020-06-15 ENCOUNTER — Encounter: Payer: Self-pay | Admitting: Internal Medicine

## 2020-06-15 DIAGNOSIS — I5030 Unspecified diastolic (congestive) heart failure: Secondary | ICD-10-CM

## 2020-06-15 DIAGNOSIS — R32 Unspecified urinary incontinence: Secondary | ICD-10-CM | POA: Diagnosis not present

## 2020-06-15 DIAGNOSIS — Z9861 Coronary angioplasty status: Secondary | ICD-10-CM

## 2020-06-15 DIAGNOSIS — E785 Hyperlipidemia, unspecified: Secondary | ICD-10-CM

## 2020-06-15 DIAGNOSIS — L719 Rosacea, unspecified: Secondary | ICD-10-CM | POA: Diagnosis not present

## 2020-06-15 DIAGNOSIS — I2510 Atherosclerotic heart disease of native coronary artery without angina pectoris: Secondary | ICD-10-CM

## 2020-06-15 DIAGNOSIS — E669 Obesity, unspecified: Secondary | ICD-10-CM

## 2020-06-15 DIAGNOSIS — F419 Anxiety disorder, unspecified: Secondary | ICD-10-CM

## 2020-06-15 DIAGNOSIS — I10 Essential (primary) hypertension: Secondary | ICD-10-CM

## 2020-06-15 DIAGNOSIS — J849 Interstitial pulmonary disease, unspecified: Secondary | ICD-10-CM | POA: Diagnosis not present

## 2020-06-15 DIAGNOSIS — G4733 Obstructive sleep apnea (adult) (pediatric): Secondary | ICD-10-CM

## 2020-06-15 DIAGNOSIS — I4819 Other persistent atrial fibrillation: Secondary | ICD-10-CM

## 2020-06-15 NOTE — Progress Notes (Signed)
Location:   [facility] Room Number: 05 Place of Service:  SNF [id number]) Provider:  Veleta Miners MD  Virgie Dad, MD  Patient Care Team: Virgie Dad, MD as PCP - General (Internal Medicine) Leonie Man, MD as PCP - Cardiology (Cardiology)  Extended Emergency Contact Information Primary Emergency Contact: [facility] Address: [address]          [place], [place] [address] [name] of [place] Phone: [phone] Mobile Phone: [phone] Relation: Spouse Secondary Emergency Contact: Janey Genta Address: [address]          [place], [place] Phone: [phone] Mobile Phone: [phone] Relation: Daughter  Code Status:  Full Code Goals of care: Advanced Directive information Advanced Directives 02/12/2020  Does Patient Have a Medical Advance Directive? Yes  Type of Paramedic of [place];Living will  Does patient want to make changes to medical advance directive? No - Patient declined  Copy of [place] in Chart? Yes - validated most recent copy scanned in chart (See row information)  Would patient like information on creating a medical advance directive? -  Pre-existing out of facility DNR order (yellow form or pink MOST form) -     Chief Complaint  Patient presents with   Medical Management of Chronic Issues    HPI:  Pt is a 87 y.o. male seen today for medical management of chronic diseases.    Patient has a history of CAD s/p PTCA, chronic atrial fibrillation on Eliquis,  chronic diastolic CHF,Interstitial lung disease with hypoxia on chronic oxygen,   OSA on CPAP,  chronic back pain with spinal stenosis, S C-spine kyphosis,  cognitive impairment   Long term resident No Complains today Stay in his room. In his recliner Does not walk.Needs help with transfers and ADLS Has lost some weight but Appetite is good No Nursing issues Past  Medical History:  Diagnosis Date   Anxiety    CAD S/P percutaneous coronary angioplasty 1996, 2009, 01/2010   PTCA of L Cx 1996; BMS-RCA [facility]) 2009; Staged PCI RCA (70% ISR) --> 3.0 mm x 23 mm BMS, staged PCI Diag - 2.0 mm x 12 mm Mini Vision BMS (2.25 mm)   Cataracts, bilateral    immature   Chronic back pain    scoliosis--pt states unable to have surgery bc of age   GERD (gastroesophageal reflux disease)    takes Nexium daily   History of colon polyps    Hyperlipidemia    Hypertension    Myocardial infarction ([place]) 1996, 2009   x 2;last time was about 8-67yrs ago    Obesity (BMI 30.0-34.9)    OSA (obstructive sleep apnea)    Osteoarthritis of both knees    Peripheral edema    Venous Stasis   Persistent atrial fibrillation ([place])    Unable to maintaine NSR after DCCV with Tikosyn.  Plan = rate control w/diltiazem (beta blocker off b/c fatigue). CHA2DS2Vasc 4. DOAC - Eliquis.    Prostate cancer ([place]) 2005   seed implant   Past Surgical History:  Procedure Laterality Date   CARDIOVERSION N/A 06/12/2015   Procedure: CARDIOVERSION;  Surgeon: Sanda Klein, MD;  Location: [facility];  Service: Cardiovascular;  Laterality: N/A;   CARDIOVERSION N/A 08/02/2015   Procedure: CARDIOVERSION;  Surgeon: [name], MD;  Location: [facility];  Service: Cardiovascular;  Laterality: N/A;   CHOLECYSTECTOMY     COLONOSCOPY     CORONARY ANGIOPLASTY  [place], 2009, August 2011   PTCA of L Cx 1996; BMS-RCA [facility]) 2009; Staged PCI RCA (70% ISR) --> 3.0 mm x 23 mm BMS, staged PCI Diag - 2.0 mm x 12 mm Mini Vision BMS (2.25 mm)   cyst removed  in college   NEUROPLASTY / TRANSPOSITION MEDIAN NERVE AT CARPAL TUNNEL BILATERAL     [facility] MYOVIEW LTD  November 2013   EF 53%; fixed mid inferior-inferolateral defect, infarct with no ischemia.   right knee arthrsoscopy  81yrs ago   right trigger finger     seeds placed into prostate   2005   TEE WITHOUT CARDIOVERSION  N/A 06/12/2015   Procedure: TRANSESOPHAGEAL ECHOCARDIOGRAM (TEE) - WITH CARDIOVERSION;  Surgeon: Sanda Klein, MD;  Location: [facility];  Service: Cardiovascular: EF 55-60%.No LA or LAA thrombus. Normal Peal PAP ~33 mmHg.   TOTAL KNEE ARTHROPLASTY  05/09/2012   Procedure: TOTAL KNEE ARTHROPLASTY;  Surgeon: Kerin Salen, MD;  Location: [facility];  Service: Orthopedics;  Laterality: Right;   TRANSTHORACIC ECHOCARDIOGRAM  06/2015   EF 60-65%. Mild LA dilation. Normal PA pressures: 32 mmHg. Aortic sclerosis but no stenosis.   wisdom teeth extraccted      Allergies  Allergen Reactions   Crab [Shellfish Allergy] Rash   Hydrocodone Hives   Adhesive [Tape] Rash   Oxycodone Rash    Allergies as of 06/15/2020      Reactions   Crab [shellfish Allergy] Rash   Hydrocodone Hives   Adhesive [tape] Rash   Oxycodone Rash      Medication List       Accurate as of June 15, 2020  3:40 PM. If you have any questions, ask your nurse or doctor.        STOP taking these medications   loratadine 10 MG tablet Commonly known as: CLARITIN Stopped by: Virgie Dad, MD     TAKE these medications   acetaminophen 500 MG tablet Commonly known as: TYLENOL Take 1,000 mg by mouth. Three times a day as needed   atorvastatin 40 MG tablet Commonly known as: LIPITOR TAKE 1 TABLET DAILY AT BEDTIME   barrier cream Crea Commonly known as: non-specified Apply 1 application topically. Zinc Oxide to buttocks area when assisting resident with toileting for redness every shift   Biofreeze 4 % Gel Generic drug: Menthol (Topical Analgesic) Apply 1 application topically in the morning, at noon, in the evening, and at bedtime. Apply to right great toe   diltiazem 240 MG 24 hr capsule Commonly known as: DILACOR XR Take 240 mg by mouth daily. For heart failure   Eliquis 5 MG Tabs tablet Generic drug: apixaban TAKE 1 TABLET TWICE A DAY (SCHEDULE FOLLOW UP WITH CARDIOLOGIST PRIOR TO NEXT REFILL  REQUEST)   esomeprazole 40 MG capsule Commonly known as: NEXIUM TAKE 1 CAPSULE DAILY   ferrous sulfate 325 (65 FE) MG tablet Take 325 mg by mouth daily with breakfast. Mon, Wed, Fri   furosemide 40 MG tablet Commonly known as: LASIX Take 40 mg by mouth daily.   losartan 50 MG tablet Commonly known as: COZAAR Take 50 mg by mouth daily.   magnesium oxide 400 MG tablet Commonly known as: MAG-OX Take 200 mg by mouth daily. 1/2 tablet   mirabegron ER 25 MG Tb24 tablet Commonly known as: MYRBETRIQ Take 25 mg by mouth daily.   MIRALAX PO Take 17 g by mouth daily.   Nitrostat 0.4 MG SL tablet Generic drug: nitroGLYCERIN USE AS  NEEDED   nystatin cream Commonly known as: MYCOSTATIN Apply 1 application topically 2 (two) times daily as needed for dry skin. Apply to corners of mouth rash What changed: Another medication with the same name was removed. Continue taking this medication, and follow the directions you see here. Changed by: Virgie Dad, MD   OXYGEN Inhale 2 L into the lungs continuous. For obstructive sleep apnea   PreviDent 5000 Booster Plus 1.1 % Pste Generic drug: Sodium Fluoride Place 1 application onto teeth at bedtime.   tamsulosin 0.4 MG Caps capsule Commonly known as: FLOMAX Take 0.4 mg by mouth daily.   triamcinolone 0.1 % Commonly known as: KENALOG Apply 1 application topically 2 (two) times daily as needed. Apply to corners of mouth rash   Vitamin D3 25 MCG (1000 UT) Caps Take 1 capsule by mouth daily.       Review of Systems  Review of Systems  Constitutional: Negative for activity change, appetite change, chills, diaphoresis, fatigue and fever.  HENT: Negative for mouth sores, postnasal drip, rhinorrhea, sinus pain and sore throat.   Respiratory: Negative for apnea, cough, chest tightness, shortness of breath and wheezing.   Cardiovascular: Negative for chest pain, palpitations and leg swelling.  Gastrointestinal: Negative for abdominal  distention, abdominal pain, constipation, diarrhea, nausea and vomiting.  Genitourinary: Negative for dysuria and frequency.  Musculoskeletal: Negative for arthralgias, joint swelling and myalgias.  Skin: Negative for rash.  Neurological: Negative for dizziness, syncope, weakness, light-headedness and numbness.  Psychiatric/Behavioral: Negative for behavioral problems, confusion and sleep disturbance.     Immunization History  Administered Date(s) Administered   H1N1 04/13/2010   Influenza, High Dose Seasonal PF 02/12/2014, 03/29/2018, 01/05/2019   Influenza-Unspecified 03/06/2013, 02/12/2014, 03/06/2017   Moderna Sars-Covid-2 Vaccination 06/08/2019, 07/06/2019, 04/14/2020   Pneumococcal Conjugate-13 02/12/2014   Pneumococcal Polysaccharide-23 02/04/2013, 02/12/2014   Td 09/04/2005   Tdap 12/11/2012   Zoster 07/07/2006   Pertinent  Health Maintenance Due  Topic Date Due   INFLUENZA VACCINE  09/03/2020 (Originally 01/05/2020)   PNA vac Low Risk Adult  Completed   No flowsheet data found. Functional Status Survey:    Vitals:   06/15/20 1527  BP: 116/60  Pulse: 74  Resp: 17  Temp: 97.8 F (36.6 C)  SpO2: 97%  Weight: 211 lb 3.2 oz (95.8 kg)  Height: 5\' 7"  (1.702 m)   Body mass index is 33.08 kg/m. Physical Exam  Constitutional: Oriented to person, place, and time. Well-developed and well-nourished.  HENT:  Head: Normocephalic.  Mouth/Throat: Oropharynx is clear and moist.  Eyes: Pupils are equal, round, and reactive to light.  Neck: Neck supple.  Cardiovascular: Normal rate and normal heart sounds.  No murmur heard. Pulmonary/Chest: Effort normal and breath sounds normal. No respiratory distress. No wheezes. She has no rales.  Abdominal: Soft. Bowel sounds are normal. No distension. There is no tenderness. There is no rebound.  Musculoskeletal: No edema.  Lymphadenopathy: none Neurological: Alert and oriented to person, place, and time.  Skin: Skin is  warm and dry.  Psychiatric: Normal mood and affect. Behavior is normal. Thought content normal.    Labs reviewed: Recent Labs    08/27/19 0000 10/02/19 0000 04/01/20 0000  NA  --  138 140  K  --  3.7 4.0  CL  --  100 100  CO2  --  30* 32*  BUN  --  13 19  CREATININE  --  0.7 0.9  CALCIUM  --  8.8 9.2  MG 1.5  1.5  --    Recent Labs    10/02/19 0000 04/01/20 0000  AST 9* 10*  ALT 4* 4*  ALKPHOS 75 72  ALBUMIN 3.3* 3.5   Recent Labs    10/02/19 0000 02/18/20 0000 04/01/20 0000  WBC 8.9 8.9 8.6  NEUTROABS 6,070  --   --   HGB 12.9* 14.0 14.0  HCT 37* 40* 39*  PLT 200 217 189   Lab Results  Component Value Date   TSH 2.58 10/02/2019   No results found for: HGBA1C Lab Results  Component Value Date   CHOL 109 10/02/2019   HDL 40 10/02/2019   LDLCALC 52 10/02/2019   TRIG 91 10/02/2019   CHOLHDL 2.9 02/12/2014    Significant Diagnostic Results in last 30 days:  No results found.  Assessment/Plan Rosacea Rash resolved with Topical  metronidazole and Trimicinolone  ILD (interstitial lung disease) ([place]) On Oxygen Pulmonary Follow up PRN  Diastolic congestive heart failure, unspecified HF chronicity ([place]) Doing well on Lasix Urinary incontinence, unspecified type Continue Myrebetriq Anxiety Doing well on Xanax PRN Persistent atrial fibrillation ([place]):  CHA2DS2-VASc Score 4; On Eliquis On Cardizem and Eliquis Essential hypertension On Cozaar Hyperlipidemia with target LDL less than 70 On Statin Repeat Lipid panel OSA (obstructive sleep apnea) Tolerating CPAP. Sleeps on recliner CAD S/P percutaneous coronary angioplasty On Statin Obesity (BMI 30.0-34.9) Refuses Therapy Mostly stays in his room in his recliner Iron Def Anemia Last hgb normal on Low dose of Iron   Family/ staff Communication:   Labs/tests ordered:

## 2020-06-18 LAB — COMPREHENSIVE METABOLIC PANEL
Albumin: 3.6 (ref 3.5–5.0)
Calcium: 8.9 (ref 8.7–10.7)
GFR calc Af Amer: 78
GFR calc non Af Amer: 68
Globulin: 2.5

## 2020-06-18 LAB — BASIC METABOLIC PANEL
BUN: 20 (ref 4–21)
CO2: 29 — AB (ref 13–22)
Chloride: 101 (ref 99–108)
Creatinine: 1 (ref 0.6–1.3)
Glucose: 94
Potassium: 4 (ref 3.4–5.3)
Sodium: 139 (ref 137–147)

## 2020-06-18 LAB — HEPATIC FUNCTION PANEL
ALT: 4 — AB (ref 10–40)
AST: 10 — AB (ref 14–40)
Alkaline Phosphatase: 65 (ref 25–125)

## 2020-06-18 LAB — LIPID PANEL
Cholesterol: 129 (ref 0–200)
HDL: 39 (ref 35–70)
LDL Cholesterol: 73
Triglycerides: 86 (ref 40–160)

## 2020-06-18 LAB — CBC AND DIFFERENTIAL
HCT: 39 — AB (ref 41–53)
Hemoglobin: 13.4 — AB (ref 13.5–17.5)
Neutrophils Absolute: 6755
Platelets: 234 (ref 150–399)
WBC: 9.5

## 2020-06-18 LAB — CBC: RBC: 4.04 (ref 3.87–5.11)

## 2020-07-08 ENCOUNTER — Non-Acute Institutional Stay (SKILLED_NURSING_FACILITY): Payer: Medicare Other | Admitting: Nurse Practitioner

## 2020-07-08 ENCOUNTER — Encounter: Payer: Self-pay | Admitting: Nurse Practitioner

## 2020-07-08 DIAGNOSIS — R32 Unspecified urinary incontinence: Secondary | ICD-10-CM

## 2020-07-08 DIAGNOSIS — J849 Interstitial pulmonary disease, unspecified: Secondary | ICD-10-CM

## 2020-07-08 DIAGNOSIS — K5900 Constipation, unspecified: Secondary | ICD-10-CM

## 2020-07-08 DIAGNOSIS — G8929 Other chronic pain: Secondary | ICD-10-CM

## 2020-07-08 DIAGNOSIS — K219 Gastro-esophageal reflux disease without esophagitis: Secondary | ICD-10-CM | POA: Diagnosis not present

## 2020-07-08 DIAGNOSIS — I4819 Other persistent atrial fibrillation: Secondary | ICD-10-CM

## 2020-07-08 DIAGNOSIS — D508 Other iron deficiency anemias: Secondary | ICD-10-CM

## 2020-07-08 DIAGNOSIS — G4733 Obstructive sleep apnea (adult) (pediatric): Secondary | ICD-10-CM

## 2020-07-08 DIAGNOSIS — I10 Essential (primary) hypertension: Secondary | ICD-10-CM | POA: Diagnosis not present

## 2020-07-08 DIAGNOSIS — M1711 Unilateral primary osteoarthritis, right knee: Secondary | ICD-10-CM

## 2020-07-08 DIAGNOSIS — M25562 Pain in left knee: Secondary | ICD-10-CM

## 2020-07-08 DIAGNOSIS — F419 Anxiety disorder, unspecified: Secondary | ICD-10-CM

## 2020-07-08 DIAGNOSIS — I2111 ST elevation (STEMI) myocardial infarction involving right coronary artery: Secondary | ICD-10-CM

## 2020-07-08 NOTE — Assessment & Plan Note (Signed)
Constipation, stable, on MiraLax qd.   

## 2020-07-08 NOTE — Assessment & Plan Note (Signed)
CHF/minimal edema BLE, on Furosamide 40mg  qd.Bun/creat 20/0.99 06/18/20

## 2020-07-08 NOTE — Assessment & Plan Note (Signed)
OA, uses mechanical lift for transfer, takes prn Tylenol.   

## 2020-07-08 NOTE — Assessment & Plan Note (Signed)
Anxiety, stable. Off alprazolam.  

## 2020-07-08 NOTE — Assessment & Plan Note (Addendum)
Anemia, stable, on Fe.Hgb 13.4 06/18/20

## 2020-07-08 NOTE — Assessment & Plan Note (Addendum)
stable, on Esomeprazole 40mg qd. 

## 2020-07-08 NOTE — Assessment & Plan Note (Signed)
HTN, blood pressure is controlled on Losartan 50mg  qd, Diltiazem 240mg  qd, Furosemide 40mg  qd.Bun/creat 20/0.99 06/18/20

## 2020-07-08 NOTE — Assessment & Plan Note (Signed)
OSA uses CPAP

## 2020-07-08 NOTE — Assessment & Plan Note (Signed)
Afib, heart rate is in control, on Diltiazem 240mg qd, Eliquis 5mg bid.   

## 2020-07-08 NOTE — Assessment & Plan Note (Deleted)
OA, uses mechanical lift for transfer, takes prn Tylenol.   

## 2020-07-08 NOTE — Assessment & Plan Note (Signed)
Urinary incontinent, no urinary retention, on Mirabegron 25mg qd, Tamsulosin 0.4mg qd.  

## 2020-07-08 NOTE — Assessment & Plan Note (Signed)
Hx of Interstitial lung disease, O2 dependent

## 2020-07-08 NOTE — Progress Notes (Signed)
Location:   [facility] Room Number: 5 Place of Service:  SNF (31) Provider: Marlana Latus NP    Patient Care Team: Virgie Dad, MD as PCP - General (Internal Medicine) Leonie Freddie Dymek, MD as PCP - Cardiology (Cardiology)  Extended Emergency Contact Information Primary Emergency Contact: [facility] Address: [address]          [place], [place] [address] [name] of [place] Phone: [phone] Mobile Phone: [phone] Relation: Spouse Secondary Emergency Contact: Janey Genta Address: [address]          [place], [place] Phone: [phone] Mobile Phone: [phone] Relation: Daughter  Code Status:  FULL CODE Goals of care: Advanced Directive information Advanced Directives 07/08/2020  Does Patient Have a Medical Advance Directive? Yes  Type of Advance Directive [place]  Does patient want to make changes to medical advance directive? No - Patient declined  Copy of [place] in Chart? Yes - validated most recent copy scanned in chart (See row information)  Would patient like information on creating a medical advance directive? -  Pre-existing out of facility DNR order (yellow form or pink MOST form) -     Chief Complaint  Patient presents with  . Medical Management of Chronic Issues    Routine Visit.     HPI:  Pt is a 87 y.o. male seen today for medical management of chronic diseases.    Urinary incontinent, no urinary retention, on Mirabegron 25mg  qd, Tamsulosin 0.4mg  qd. Constipation, stable, on MiraLax qd. OA, uses mechanical lift for transfer, takes prn Tylenol.  Hx of Interstitial lung disease, O2 dependent CHF/minimal edema BLE, on Furosamide 40mg  qd.Bun/creat 20/0.99 06/18/20 Anxiety,stable.Off alprazolam. Afib, heart rate is in control, on Diltiazem 240mg  qd,  Eliquis 5mg  bid.  GERD, stable, on Esomeprazole 40mg  qd.  Anemia, stable, on Fe.Hgb 13.4 06/18/20 HTN, blood pressure is controlled on Losartan 50mg  qd, Diltiazem 240mg  qd, Furosemide 40mg  qd.Bun/creat 20/0.99 06/18/20  OSA uses CPAP   Past Medical History:  Diagnosis Date  . Anxiety   . CAD S/P percutaneous coronary angioplasty 1996, 2009, 01/2010   PTCA of L Cx 1996; BMS-RCA [facility]) 2009; Staged PCI RCA (70% ISR) --> 3.0 mm x 23 mm BMS, staged PCI Diag - 2.0 mm x 12 mm Mini Vision BMS (2.25 mm)  . Cataracts, bilateral    immature  . Chronic back pain    scoliosis--pt states unable to have surgery bc of age  . GERD (gastroesophageal reflux disease)    takes Nexium daily  . History of colon polyps   . Hyperlipidemia   . Hypertension   . Myocardial infarction [facility]) 1996, 2009   x 2;last time was about 8-32yrs ago   . Obesity (BMI 30.0-34.9)   . OSA (obstructive sleep apnea)   . Osteoarthritis of both knees   . Peripheral edema    Venous Stasis  . Persistent atrial fibrillation (HCC)    Unable to maintaine NSR after DCCV with Tikosyn.  Plan = rate control w/diltiazem (beta blocker off b/c fatigue). CHA2DS2Vasc 4. DOAC - Eliquis.   . Prostate cancer ([place]) 2005   seed implant   Past Surgical History:  Procedure Laterality Date  . CARDIOVERSION N/A 06/12/2015   Procedure: CARDIOVERSION;  Surgeon: Sanda Klein, MD;  Location: [facility];  Service: Cardiovascular;  Laterality: N/A;  . CARDIOVERSION N/A 08/02/2015   Procedure: CARDIOVERSION;  Surgeon: [name], MD;  Location: [facility];  Service: Cardiovascular;  Laterality: N/A;  . CHOLECYSTECTOMY    . COLONOSCOPY    . CORONARY ANGIOPLASTY WITH STENT PLACEMENT  1996, 2009, August 2011   PTCA of L Cx 1996; BMS-RCA [facility]) 2009; Staged PCI RCA (70% ISR) --> 3.0 mm x 23 mm BMS, staged PCI Diag - 2.0 mm x 12 mm Mini Vision BMS (2.25 mm)  . cyst removed  in college  . NEUROPLASTY / TRANSPOSITION  MEDIAN NERVE AT CARPAL TUNNEL BILATERAL    . NM MYOVIEW LTD  November 2013   EF 53%; fixed mid inferior-inferolateral defect, infarct with no ischemia.  . right knee arthrsoscopy  53yrs ago  . right trigger finger    . seeds placed into prostate   2005  . TEE WITHOUT CARDIOVERSION N/A 06/12/2015   Procedure: TRANSESOPHAGEAL ECHOCARDIOGRAM (TEE) - WITH CARDIOVERSION;  Surgeon: Sanda Klein, MD;  Location: [facility];  Service: Cardiovascular: EF 55-60%.No LA or LAA thrombus. Normal Peal PAP ~33 mmHg.  [name] TOTAL KNEE ARTHROPLASTY  05/09/2012   Procedure: TOTAL KNEE ARTHROPLASTY;  Surgeon: Kerin Salen, MD;  Location: [facility];  Service: Orthopedics;  Laterality: Right;  . TRANSTHORACIC ECHOCARDIOGRAM  06/2015   EF 60-65%. Mild LA dilation. Normal PA pressures: 32 mmHg. Aortic sclerosis but no stenosis.  [name] wisdom teeth extraccted      Allergies  Allergen Reactions  . Crab [Shellfish Allergy] Rash  . Hydrocodone Hives  . Adhesive [Tape] Rash  . Oxycodone Rash    Allergies as of 07/08/2020      Reactions   Crab [shellfish Allergy] Rash   Hydrocodone Hives   Adhesive [tape] Rash   Oxycodone Rash      Medication List       Accurate as of July 08, 2020  4:28 PM. If you have any questions, ask your nurse or doctor.        acetaminophen 500 MG tablet Commonly known as: TYLENOL Take 1,000 mg by mouth. Three times a day as needed   atorvastatin 40 MG tablet Commonly known as: LIPITOR TAKE 1 TABLET DAILY AT BEDTIME   barrier cream Crea Commonly known as: non-specified Apply 1 application topically. Zinc Oxide to buttocks area when assisting resident with toileting for redness every shift   Biofreeze 4 % Gel Generic drug: Menthol (Topical Analgesic) Apply 1 application topically in the morning, at noon, in the evening, and at bedtime. Apply to right great toe   diltiazem 240 MG 24 hr capsule Commonly known as: DILACOR XR Take 240 mg by mouth daily. For heart failure    Eliquis 5 MG Tabs tablet Generic drug: apixaban TAKE 1 TABLET TWICE A DAY (SCHEDULE FOLLOW UP WITH CARDIOLOGIST PRIOR TO NEXT REFILL REQUEST)   esomeprazole 40 MG capsule Commonly known as: NEXIUM TAKE 1 CAPSULE DAILY   ferrous sulfate 325 (65 FE) MG tablet Take 325 mg by mouth daily with breakfast. Mon, Wed, Fri   furosemide 40 MG tablet Commonly known as: LASIX Take 40 mg by mouth daily.   losartan 50 MG tablet Commonly known as: COZAAR Take 50 mg by mouth daily.   magnesium oxide 400 MG tablet Commonly known as: MAG-OX Take 200 mg by mouth daily. 1/2 tablet   mirabegron ER 25 MG Tb24 tablet Commonly known as: MYRBETRIQ Take 25 mg by mouth daily.   MIRALAX PO Take 17 g by mouth daily.   Nitrostat 0.4 MG SL tablet Generic drug: nitroGLYCERIN USE AS NEEDED   nystatin cream Commonly known as: MYCOSTATIN Apply  1 application topically 2 (two) times daily as needed for dry skin. Apply to corners of mouth rash   OXYGEN Inhale 2 L into the lungs continuous. For obstructive sleep apnea   PreviDent 5000 Booster Plus 1.1 % Pste Generic drug: Sodium Fluoride Place 1 application onto teeth at bedtime.   tamsulosin 0.4 MG Caps capsule Commonly known as: FLOMAX Take 0.4 mg by mouth daily.   triamcinolone 0.1 % Commonly known as: KENALOG Apply 1 application topically 2 (two) times daily as needed. Apply to corners of mouth rash   Vitamin D3 25 MCG (1000 UT) Caps Take 1 capsule by mouth daily.       Review of Systems  Constitutional: Negative for activity change, fever and unexpected weight change.  HENT: Positive for hearing loss. Negative for congestion and voice change.   Eyes: Negative for visual disturbance.  Respiratory: Positive for shortness of breath. Negative for cough and wheezing.        O2 dependent. DOE  Cardiovascular: Positive for leg swelling.  Gastrointestinal: Negative for abdominal pain and constipation.       Incontinent of feces.    Genitourinary: Negative for dysuria and urgency.       Incontinent of urine.   Musculoskeletal: Positive for arthralgias and gait problem.       Left knee pain is chronic. The right great toe pain at the base of the toe nail, but no warmth, redness, or swelling.   Skin: Negative for color change.  Neurological: Negative for speech difficulty, weakness and light-headedness.       Memory lapses.   Psychiatric/Behavioral: Negative for behavioral problems and sleep disturbance. The patient is not nervous/anxious.     Immunization History  Administered Date(s) Administered  . H1N1 04/13/2010  . Influenza, High Dose Seasonal PF 02/12/2014, 03/29/2018, 01/05/2019  . Influenza-Unspecified 03/06/2013, 02/12/2014, 03/06/2017  . Moderna Sars-Covid-2 Vaccination 06/08/2019, 07/06/2019, 04/14/2020  . Pneumococcal Conjugate-13 02/12/2014  . Pneumococcal Polysaccharide-23 02/04/2013, 02/12/2014  . Td 09/04/2005  . Tdap 12/11/2012  . Zoster 07/07/2006   Pertinent  Health Maintenance Due  Topic Date Due  . INFLUENZA VACCINE  09/03/2020 (Originally 01/05/2020)  . PNA vac Low Risk Adult  Completed   No flowsheet data found. Functional Status Survey:    Vitals:   07/08/20 1619  BP: 132/68  Pulse: 62  Resp: 20  Temp: 97.9 F (36.6 C)  SpO2: 95%  Weight: 213 lb 3.2 oz (96.7 kg)  Height: 5\' 7"  (1.702 m)   Body mass index is 33.39 kg/m. Physical Exam Vitals and nursing note reviewed.  Constitutional:      Appearance: Normal appearance.  HENT:     Head: Normocephalic and atraumatic.     Nose: Nose normal.     Mouth/Throat:     Mouth: Mucous membranes are moist.  Eyes:     Extraocular Movements: Extraocular movements intact.     Conjunctiva/sclera: Conjunctivae normal.     Pupils: Pupils are equal, round, and reactive to light.  Cardiovascular:     Rate and Rhythm: Normal rate. Rhythm irregular.     Heart sounds: No murmur heard.     Comments: DP pulses not felt R, right toe pain  is chronic.  DP pulse felt L Pulmonary:     Breath sounds: No wheezing.     Comments: O2 dependent.  Abdominal:     General: Bowel sounds are normal.     Palpations: Abdomen is soft.     Tenderness: There is no abdominal  tenderness.  Musculoskeletal:     Cervical back: Normal range of motion and neck supple.     Right lower leg: Edema present.     Left lower leg: Edema present.     Comments: Trace edema BLE., less than prior, near none.  Mechanical lift for transfer.   Skin:    General: Skin is warm and dry.  Neurological:     General: No focal deficit present.     Mental Status: He is alert and oriented to person, place, and time. Mental status is at baseline.     Motor: No weakness.     Coordination: Coordination normal.     Gait: Gait abnormal.  Psychiatric:        Mood and Affect: Mood normal.        Behavior: Behavior normal.        Thought Content: Thought content normal.        Judgment: Judgment normal.     Labs reviewed: Recent Labs    08/27/19 0000 10/02/19 0000 04/01/20 0000 06/18/20 0000  NA  --  138 140 139  K  --  3.7 4.0 4.0  CL  --  100 100 101  CO2  --  30* 32* 29*  BUN  --  13 19 20   CREATININE  --  0.7 0.9 1.0  CALCIUM  --  8.8 9.2 8.9  MG 1.5 1.5  --   --    Recent Labs    10/02/19 0000 04/01/20 0000 06/18/20 0000  AST 9* 10* 10*  ALT 4* 4* 4*  ALKPHOS 75 72 65  ALBUMIN 3.3* 3.5 3.6   Recent Labs    10/02/19 0000 02/18/20 0000 04/01/20 0000 06/18/20 0000  WBC 8.9 8.9 8.6 9.5  NEUTROABS 6,070  --   --  6,755.00  HGB 12.9* 14.0 14.0 13.4*  HCT 37* 40* 39* 39*  PLT 200 217 189 234   Lab Results  Component Value Date   TSH 2.58 10/02/2019   No results found for: HGBA1C Lab Results  Component Value Date   CHOL 129 06/18/2020   HDL 39 06/18/2020   LDLCALC 73 06/18/2020   TRIG 86 06/18/2020   CHOLHDL 2.9 02/12/2014    Significant Diagnostic Results in last 30 days:  No results found.  Assessment/Plan  Iron deficiency  anemia Anemia, stable, on Fe.Hgb 13.4 06/18/20   Essential hypertension HTN, blood pressure is controlled on Losartan 50mg  qd, Diltiazem 240mg  qd, Furosemide 40mg  qd.Bun/creat 20/0.99 06/18/20   OSA (obstructive sleep apnea) OSA uses CPAP  GERD (gastroesophageal reflux disease) stable, on Esomeprazole 40mg  qd.    Persistent atrial fibrillation ([place]):  CHA2DS2-VASc Score 4; On Eliquis Afib, heart rate is in control, on Diltiazem 240mg  qd, Eliquis 5mg  bid.   Anxiety Anxiety,stable.Off alprazolam.   Myocardial infarction; history of CHF/minimal edema BLE, on Furosamide 40mg  qd.Bun/creat 20/0.99 06/18/20   ILD (interstitial lung disease) (HCC) Hx of Interstitial lung disease, O2 dependent  Constipation Constipation, stable, on MiraLax qd.  Urinary incontinence Urinary incontinent, no urinary retention, on Mirabegron 25mg  qd, Tamsulosin 0.4mg  qd.   Left knee pain OA, uses me chanical lift for transfer, takes prn Tylenol.    Family/ staff Communication: plan of care reviewed with the patient and charge nurse.   Labs/tests ordered: none  Time spend 35 minutes.

## 2020-07-09 ENCOUNTER — Encounter: Payer: Self-pay | Admitting: Nurse Practitioner

## 2020-08-05 ENCOUNTER — Non-Acute Institutional Stay (SKILLED_NURSING_FACILITY): Payer: Medicare Other | Admitting: Nurse Practitioner

## 2020-08-05 ENCOUNTER — Encounter: Payer: Self-pay | Admitting: Nurse Practitioner

## 2020-08-05 DIAGNOSIS — I5030 Unspecified diastolic (congestive) heart failure: Secondary | ICD-10-CM | POA: Diagnosis not present

## 2020-08-05 DIAGNOSIS — G4733 Obstructive sleep apnea (adult) (pediatric): Secondary | ICD-10-CM

## 2020-08-05 DIAGNOSIS — M1711 Unilateral primary osteoarthritis, right knee: Secondary | ICD-10-CM

## 2020-08-05 DIAGNOSIS — I4819 Other persistent atrial fibrillation: Secondary | ICD-10-CM

## 2020-08-05 DIAGNOSIS — N3942 Incontinence without sensory awareness: Secondary | ICD-10-CM

## 2020-08-05 DIAGNOSIS — D509 Iron deficiency anemia, unspecified: Secondary | ICD-10-CM

## 2020-08-05 DIAGNOSIS — K219 Gastro-esophageal reflux disease without esophagitis: Secondary | ICD-10-CM | POA: Diagnosis not present

## 2020-08-05 DIAGNOSIS — K5900 Constipation, unspecified: Secondary | ICD-10-CM

## 2020-08-05 DIAGNOSIS — I10 Essential (primary) hypertension: Secondary | ICD-10-CM

## 2020-08-05 DIAGNOSIS — F419 Anxiety disorder, unspecified: Secondary | ICD-10-CM | POA: Diagnosis not present

## 2020-08-05 DIAGNOSIS — J849 Interstitial pulmonary disease, unspecified: Secondary | ICD-10-CM

## 2020-08-05 LAB — MAGNESIUM: Magnesium: 1.6

## 2020-08-05 NOTE — Assessment & Plan Note (Addendum)
/

## 2020-08-05 NOTE — Assessment & Plan Note (Signed)
uses CPAP 

## 2020-08-05 NOTE — Assessment & Plan Note (Signed)
blood pressure is controlled on Losartan 50mg  qd, Diltiazem 240mg  qd, Furosemide 40mg  qd.Bun/creat 20/0.99 06/18/20

## 2020-08-05 NOTE — Assessment & Plan Note (Signed)
Urinary incontinent, no urinary retention, on Mirabegron 25mg qd, Tamsulosin 0.4mg qd.  

## 2020-08-05 NOTE — Assessment & Plan Note (Signed)
stable, on Fe.Hgb13.4 06/18/20  

## 2020-08-05 NOTE — Assessment & Plan Note (Signed)
heart rate is in control, on Diltiazem 240mg qd, Eliquis 5mg bid. 

## 2020-08-05 NOTE — Assessment & Plan Note (Signed)
uses mechanical lift for transfer, takes prn Tylenol. 

## 2020-08-05 NOTE — Assessment & Plan Note (Signed)
stable, on MiraLax qd. 

## 2020-08-05 NOTE — Assessment & Plan Note (Signed)
stable, on Esomeprazole 40mg qd. 

## 2020-08-05 NOTE — Progress Notes (Signed)
Location:    [place] Room Number: 5 Place of Service:  SNF (31) Provider: Lennie Odor Aldona Bryner NP  Virgie Dad, MD  Patient Care Team: Virgie Dad, MD as PCP - General (Internal Medicine) Leonie Ciani Rutten, MD as PCP - Cardiology (Cardiology)  Extended Emergency Contact Information Primary Emergency Contact: [facility] Address: [address]          [place], [name] [address] [name] of [place] Phone: [phone] Mobile Phone: [phone] Relation: Spouse Secondary Emergency Contact: Janey Genta Address: [address]          [place], [place] Phone: [phone] Mobile Phone: [phone] Relation: Daughter  Code Status:  Full Code Managed Care Goals of care: Advanced Directive information Advanced Directives 07/08/2020  Does Patient Have a Medical Advance Directive? Yes  Type of Advance Directive [place]  Does patient want to make changes to medical advance directive? No - Patient declined  Copy of [place] in Chart? Yes - validated most recent copy scanned in chart (See row information)  Would patient like information on creating a medical advance directive? -  Pre-existing out of facility DNR order (yellow form or pink MOST form) -     Chief Complaint  Patient presents with  . Medical Management of Chronic Issues    HPI:  Pt is a 87 y.o. male seen today for medical management of chronic diseases.       Urinary incontinent, no urinary retention, on Mirabegron 25mg  qd, Tamsulosin 0.4mg  qd. Constipation, stable, on MiraLax qd. OA, uses mechanical lift for transfer, takes prn Tylenol.  Hx of Interstitial lung disease, O2 dependent CHF/minimal edema BLE, on Furosamide 40mg  qd.Bun/creat 20/0.99 06/18/20 Anxiety,stable.Off alprazolam. Afib, heart rate is in control, on  Diltiazem 240mg  qd, Eliquis 5mg  bid.  GERD, stable, on Esomeprazole 40mg  qd.  Anemia, stable, on Fe.Hgb 13.4 06/18/20 HTN, blood pressure is controlled on Losartan 50mg  qd, Diltiazem 240mg  qd, Furosemide 40mg  qd.Bun/creat 20/0.99 06/18/20             OSA uses CPAP  Past Medical History:  Diagnosis Date  . Anxiety   . CAD S/P percutaneous coronary angioplasty 1996, 2009, 01/2010   PTCA of L Cx 1996; BMS-RCA [facility]) 2009; Staged PCI RCA (70% ISR) --> 3.0 mm x 23 mm BMS, staged PCI Diag - 2.0 mm x 12 mm Mini Vision BMS (2.25 mm)  . Cataracts, bilateral    immature  . Chronic back pain    scoliosis--pt states unable to have surgery bc of age  . GERD (gastroesophageal reflux disease)    takes Nexium daily  . History of colon polyps   . Hyperlipidemia   . Hypertension   . Myocardial infarction [facility]) 1996, 2009   x 2;last time was about 8-13yrs ago   . Obesity (BMI 30.0-34.9)   . OSA (obstructive sleep apnea)   . Osteoarthritis of both knees   . Peripheral edema    Venous Stasis  . Persistent atrial fibrillation (HCC)    Unable to maintaine NSR after DCCV with Tikosyn.  Plan = rate control w/diltiazem (beta blocker off b/c fatigue). CHA2DS2Vasc 4. DOAC - Eliquis.   . Prostate cancer ([place]) 2005   seed implant   Past Surgical History:  Procedure Laterality Date  . CARDIOVERSION N/A 06/12/2015   Procedure: CARDIOVERSION;  Surgeon: Sanda Klein, MD;  Location: [place] ENDOSCOPY;  Service: Cardiovascular;  Laterality: N/A;  . CARDIOVERSION N/A 08/02/2015  Procedure: CARDIOVERSION;  Surgeon: [name], MD;  Location: [facility] OR;  Service: Cardiovascular;  Laterality: N/A;  . CHOLECYSTECTOMY    . COLONOSCOPY    . CORONARY ANGIOPLASTY WITH STENT PLACEMENT  1996, 2009, August 2011   PTCA of L Cx 1996; BMS-RCA [facility]) 2009; Staged PCI RCA (70% ISR) --> 3.0 mm x 23 mm BMS, staged PCI Diag - 2.0 mm x 12 mm Mini Vision BMS (2.25 mm)  . cyst removed  in college  .  NEUROPLASTY / TRANSPOSITION MEDIAN NERVE AT CARPAL TUNNEL BILATERAL    . NM MYOVIEW LTD  November 2013   EF 53%; fixed mid inferior-inferolateral defect, infarct with no ischemia.  . right knee arthrsoscopy  58yrs ago  . right trigger finger    . seeds placed into prostate   2005  . TEE WITHOUT CARDIOVERSION N/A 06/12/2015   Procedure: TRANSESOPHAGEAL ECHOCARDIOGRAM (TEE) - WITH CARDIOVERSION;  Surgeon: Sanda Klein, MD;  Location: [place];  Service: Cardiovascular: EF 55-60%.No LA or LAA thrombus. Normal Peal PAP ~33 mmHg.  [name] TOTAL KNEE ARTHROPLASTY  05/09/2012   Procedure: TOTAL KNEE ARTHROPLASTY;  Surgeon: Kerin Salen, MD;  Location: [place];  Service: Orthopedics;  Laterality: Right;  . TRANSTHORACIC ECHOCARDIOGRAM  06/2015   EF 60-65%. Mild LA dilation. Normal PA pressures: 32 mmHg. Aortic sclerosis but no stenosis.  [name] wisdom teeth extraccted      Allergies  Allergen Reactions  . Crab [Shellfish Allergy] Rash  . Hydrocodone Hives  . Adhesive [Tape] Rash  . Oxycodone Rash    Allergies as of 08/05/2020      Reactions   Crab [shellfish Allergy] Rash   Hydrocodone Hives   Adhesive [tape] Rash   Oxycodone Rash      Medication List       Accurate as of August 05, 2020  4:17 PM. If you have any questions, ask your nurse or doctor.        acetaminophen 500 MG tablet Commonly known as: TYLENOL Take 1,000 mg by mouth. Three times a day as needed   atorvastatin 40 MG tablet Commonly known as: LIPITOR TAKE 1 TABLET DAILY AT BEDTIME   barrier cream Crea Commonly known as: non-specified Apply 1 application topically. Zinc Oxide to buttocks area when assisting resident with toileting for redness every shift   Biofreeze 4 % Gel Generic drug: Menthol (Topical Analgesic) Apply 1 application topically in the morning, at noon, in the evening, and at bedtime. Apply to right great toe   diltiazem 240 MG 24 hr capsule Commonly known as: DILACOR XR Take 240 mg by mouth daily. For  heart failure   Eliquis 5 MG Tabs tablet Generic drug: apixaban TAKE 1 TABLET TWICE A DAY (SCHEDULE FOLLOW UP WITH CARDIOLOGIST PRIOR TO NEXT REFILL REQUEST)   esomeprazole 40 MG capsule Commonly known as: NEXIUM TAKE 1 CAPSULE DAILY   ferrous sulfate 325 (65 FE) MG tablet Take 325 mg by mouth daily with breakfast. Mon, Wed, Fri   furosemide 40 MG tablet Commonly known as: LASIX Take 40 mg by mouth daily.   losartan 50 MG tablet Commonly known as: COZAAR Take 50 mg by mouth daily.   magnesium oxide 400 MG tablet Commonly known as: MAG-OX Take 200 mg by mouth daily. 1/2 tablet   mirabegron ER 25 MG Tb24 tablet Commonly known as: MYRBETRIQ Take 25 mg by mouth daily.   MIRALAX PO Take 17 g by mouth daily.   Nitrostat 0.4 MG SL tablet Generic drug: nitroGLYCERIN  USE AS NEEDED   nystatin cream Commonly known as: MYCOSTATIN Apply 1 application topically 2 (two) times daily as needed for dry skin. Apply to corners of mouth rash   OXYGEN Inhale 2 L into the lungs continuous. For obstructive sleep apnea   PreviDent 5000 Booster Plus 1.1 % Pste Generic drug: Sodium Fluoride Place 1 application onto teeth at bedtime.   tamsulosin 0.4 MG Caps capsule Commonly known as: FLOMAX Take 0.4 mg by mouth daily.   triamcinolone 0.1 % Commonly known as: KENALOG Apply 1 application topically 2 (two) times daily as needed. Apply to corners of mouth rash   Vitamin D3 25 MCG (1000 UT) Caps Take 1 capsule by mouth daily.       Review of Systems  Constitutional: Negative for activity change, fatigue, fever and unexpected weight change.  HENT: Positive for hearing loss. Negative for congestion and voice change.   Eyes: Negative for visual disturbance.  Respiratory: Positive for shortness of breath. Negative for cough and wheezing.        O2 dependent. DOE  Cardiovascular: Positive for leg swelling.  Gastrointestinal: Negative for abdominal pain and constipation.        Incontinent of feces.   Genitourinary: Negative for dysuria and urgency.       Incontinent of urine.   Musculoskeletal: Positive for arthralgias and gait problem.       Left knee pain is chronic. The right great toe pain at the base of the toe nail, but no warmth, redness, or swelling.   Skin: Negative for color change.  Neurological: Negative for speech difficulty, weakness and light-headedness.       Memory lapses.   Psychiatric/Behavioral: Negative for behavioral problems and sleep disturbance. The patient is not nervous/anxious.     Immunization History  Administered Date(s) Administered  . H1N1 04/13/2010  . Influenza, High Dose Seasonal PF 02/12/2014, 03/29/2018, 01/05/2019  . Influenza-Unspecified 03/06/2013, 02/12/2014, 03/06/2017  . Moderna Sars-Covid-2 Vaccination 06/08/2019, 07/06/2019, 04/14/2020  . Pneumococcal Conjugate-13 02/12/2014  . Pneumococcal Polysaccharide-23 02/04/2013, 02/12/2014  . Td 09/04/2005  . Tdap 12/11/2012  . Zoster 07/07/2006   Pertinent  Health Maintenance Due  Topic Date Due  . INFLUENZA VACCINE  09/03/2020 (Originally 01/05/2020)  . PNA vac Low Risk Adult  Completed   No flowsheet data found. Functional Status Survey:    Vitals:   08/05/20 1520  BP: 132/80  Pulse: 76  Resp: 18  Temp: 98 F (36.7 C)  SpO2: 96%  Weight: 213 lb 6.4 oz (96.8 kg)  Height: 5\' 7"  (1.702 m)   Body mass index is 33.42 kg/m. Physical Exam Vitals and nursing note reviewed.  Constitutional:      Appearance: Normal appearance.  HENT:     Head: Normocephalic and atraumatic.     Mouth/Throat:     Mouth: Mucous membranes are moist.  Eyes:     Extraocular Movements: Extraocular movements intact.     Conjunctiva/sclera: Conjunctivae normal.     Pupils: Pupils are equal, round, and reactive to light.  Cardiovascular:     Rate and Rhythm: Normal rate. Rhythm irregular.     Heart sounds: No murmur heard.     Comments: DP pulses not felt R, right toe pain is  chronic.  DP pulse felt L Pulmonary:     Breath sounds: No wheezing.     Comments: O2 dependent.  Abdominal:     General: Bowel sounds are normal.     Palpations: Abdomen is soft.  Tenderness: There is no abdominal tenderness.  Musculoskeletal:     Cervical back: Normal range of motion and neck supple.     Right lower leg: Edema present.     Left lower leg: Edema present.     Comments: Trace edema BLE., less than prior, near none.  Mechanical lift for transfer.   Skin:    General: Skin is warm and dry.  Neurological:     General: No focal deficit present.     Mental Status: He is alert and oriented to person, place, and time. Mental status is at baseline.     Motor: No weakness.     Coordination: Coordination normal.     Gait: Gait abnormal.  Psychiatric:        Mood and Affect: Mood normal.        Behavior: Behavior normal.        Thought Content: Thought content normal.        Judgment: Judgment normal.     Labs reviewed: Recent Labs    08/27/19 0000 10/02/19 0000 04/01/20 0000 06/18/20 0000 07/23/20 0000  NA  --  138 140 139  --   K  --  3.7 4.0 4.0  --   CL  --  100 100 101  --   CO2  --  30* 32* 29*  --   BUN  --  13 19 20   --   CREATININE  --  0.7 0.9 1.0  --   CALCIUM  --  8.8 9.2 8.9  --   MG 1.5 1.5  --   --  1.6   Recent Labs    10/02/19 0000 04/01/20 0000 06/18/20 0000  AST 9* 10* 10*  ALT 4* 4* 4*  ALKPHOS 75 72 65  ALBUMIN 3.3* 3.5 3.6   Recent Labs    10/02/19 0000 02/18/20 0000 04/01/20 0000 06/18/20 0000  WBC 8.9 8.9 8.6 9.5  NEUTROABS 6,070  --   --  6,755.00  HGB 12.9* 14.0 14.0 13.4*  HCT 37* 40* 39* 39*  PLT 200 217 189 234   Lab Results  Component Value Date   TSH 2.58 10/02/2019   No results found for: HGBA1C Lab Results  Component Value Date   CHOL 129 06/18/2020   HDL 39 06/18/2020   LDLCALC 73 06/18/2020   TRIG 86 06/18/2020   CHOLHDL 2.9 02/12/2014    Significant Diagnostic Results in last 30 days:  No  results found.  Assessment/Plan  Diastolic CHF (HCC) minimal edema BLE, on Furosamide 40mg  qd.Bun/creat 20/0.99 06/18/20   Anxiety stable.Off alprazolam.   Persistent atrial fibrillation ([place]):  CHA2DS2-VASc Score 4; On Eliquis heart rate is in control, on Diltiazem 240mg  qd, Eliquis 5mg  bid.    GERD (gastroesophageal reflux disease) stable, on Esomeprazole 40mg  qd.    Iron deficiency anemia stable, on Fe.Hgb 13.4 06/18/20  Essential hypertension  blood pressure is controlled on Losartan 50mg  qd, Diltiazem 240mg  qd, Furosemide 40mg  qd.Bun/creat 20/0.99 06/18/20   OSA (obstructive sleep apnea) uses CPAP   ILD (interstitial lung disease) ([place]) O2 dependent   Osteoarthritis of right knee uses mechanical lift for transfer, takes prn Tylenol.    Constipation stable, on MiraLax qd.   Urinary incontinence Urinary incontinent, no urinary retention, on Mirabegron 25mg  qd, Tamsulosin 0.4mg  qd.    Family/ staff Communication: plan of care reviewed with the patient and charge nurse.   Labs/tests ordered: none  Time spend 35 minutes.

## 2020-08-05 NOTE — Assessment & Plan Note (Signed)
stable.Off alprazolam. 

## 2020-08-05 NOTE — Assessment & Plan Note (Signed)
O2 dependent.

## 2020-09-17 ENCOUNTER — Encounter: Payer: Self-pay | Admitting: Nurse Practitioner

## 2020-09-17 NOTE — Progress Notes (Signed)
This encounter was created in error - please disregard.  This encounter was created in error - please disregard.

## 2020-09-22 ENCOUNTER — Encounter: Payer: Self-pay | Admitting: Internal Medicine

## 2020-09-22 ENCOUNTER — Non-Acute Institutional Stay (SKILLED_NURSING_FACILITY): Payer: Medicare Other | Admitting: Internal Medicine

## 2020-09-22 DIAGNOSIS — I4819 Other persistent atrial fibrillation: Secondary | ICD-10-CM

## 2020-09-22 DIAGNOSIS — D509 Iron deficiency anemia, unspecified: Secondary | ICD-10-CM

## 2020-09-22 DIAGNOSIS — M1711 Unilateral primary osteoarthritis, right knee: Secondary | ICD-10-CM

## 2020-09-22 DIAGNOSIS — I5030 Unspecified diastolic (congestive) heart failure: Secondary | ICD-10-CM

## 2020-09-22 DIAGNOSIS — J849 Interstitial pulmonary disease, unspecified: Secondary | ICD-10-CM

## 2020-09-22 DIAGNOSIS — F419 Anxiety disorder, unspecified: Secondary | ICD-10-CM | POA: Diagnosis not present

## 2020-09-22 DIAGNOSIS — G4733 Obstructive sleep apnea (adult) (pediatric): Secondary | ICD-10-CM

## 2020-09-22 DIAGNOSIS — K219 Gastro-esophageal reflux disease without esophagitis: Secondary | ICD-10-CM

## 2020-09-22 DIAGNOSIS — I10 Essential (primary) hypertension: Secondary | ICD-10-CM

## 2020-09-22 NOTE — Progress Notes (Signed)
Location:   [place] Room Number: 5 Place of Service:  SNF [phone]) Provider:  Veleta Miners MD  Virgie Dad, MD  Patient Care Team: Virgie Dad, MD as PCP - General (Internal Medicine) Leonie Man, MD as PCP - Cardiology (Cardiology)  Extended Emergency Contact Information Primary Emergency Contact: [facility] Address: [address]          [place], [place] [address] [name] of [place] Phone: [phone] Mobile Phone: [phone] Relation: Spouse Secondary Emergency Contact: Janey Genta Address: [address]          [place], [place] Phone: [phone] Mobile Phone: [phone] Relation: Daughter  Code Status:  Full Code Managed Care Goals of care: Advanced Directive information Advanced Directives 09/22/2020  Does Patient Have a Medical Advance Directive? Yes  Type of Advance Directive Living will;Healthcare Power of Attorney  Does patient want to make changes to medical advance directive? No - Patient declined  Copy of [place] in Chart? Yes - validated most recent copy scanned in chart (See row information)  Would patient like information on creating a medical advance directive? -  Pre-existing out of facility DNR order (yellow form or pink MOST form) -     Chief Complaint  Patient presents with  . Medical Management of Chronic Issues    HPI:  Pt is a 87 y.o. male seen today for medical management of chronic diseases.    Patient has a history of CAD s/p PTCA, chronic atrial fibrillation on Eliquis,  chronic diastolic CHF,Interstitial lung disease with hypoxia on chronic oxygen,  OSA on CPAP doesn't use it  chronic back pain with spinal stenosis, S C-spine kyphosis,  cognitive impairment H/O Rosacea  Long term resident in SNF He usually stays in his Room Never comes out. Stays in his recliner during daytime [place] No Complains No Nursing issues Has  lost some weight   Past Medical History:  Diagnosis Date  . Anxiety   . CAD S/P percutaneous coronary angioplasty 1996, 2009, 01/2010   PTCA of L Cx 1996; BMS-RCA [facility]) 2009; Staged PCI RCA (70% ISR) --> 3.0 mm x 23 mm BMS, staged PCI Diag - 2.0 mm x 12 mm Mini Vision BMS (2.25 mm)  . Cataracts, bilateral    immature  . Chronic back pain    scoliosis--pt states unable to have surgery bc of age  . GERD (gastroesophageal reflux disease)    takes Nexium daily  . History of colon polyps   . Hyperlipidemia   . Hypertension   . Myocardial infarction [facility]) 1996, 2009   x 2;last time was about 8-35yrs ago   . Obesity (BMI 30.0-34.9)   . OSA (obstructive sleep apnea)   . Osteoarthritis of both knees   . Peripheral edema    Venous Stasis  . Persistent atrial fibrillation (HCC)    Unable to maintaine NSR after DCCV with Tikosyn.  Plan = rate control w/diltiazem (beta blocker off b/c fatigue). CHA2DS2Vasc 4. DOAC - Eliquis.   . Prostate cancer ([place]) 2005   seed implant   Past Surgical History:  Procedure Laterality Date  . CARDIOVERSION N/A 06/12/2015   Procedure: CARDIOVERSION;  Surgeon: Sanda Klein, MD;  Location: [place] [facility]COPY;  Service: Cardiovascular;  Laterality: N/A;  . CARDIOVERSION N/A 08/02/2015   Procedure: CARDIOVERSION;  Surgeon: [name], MD;  Location: [place];  Service: Cardiovascular;  Laterality: N/A;  . CHOLECYSTECTOMY    . COLONOSCOPY    .  CORONARY ANGIOPLASTY WITH STENT PLACEMENT  1996, 2009, August 2011   PTCA of L Cx 1996; BMS-RCA [facility]) 2009; Staged PCI RCA (70% ISR) --> 3.0 mm x 23 mm BMS, staged PCI Diag - 2.0 mm x 12 mm Mini Vision BMS (2.25 mm)  . cyst removed  in college  . NEUROPLASTY / TRANSPOSITION MEDIAN NERVE AT CARPAL TUNNEL BILATERAL    . NM MYOVIEW LTD  November 2013   EF 53%; fixed mid inferior-inferolateral defect, infarct with no ischemia.  . right knee arthrsoscopy  86yrs ago  . right trigger finger    . seeds placed into prostate   2005   . TEE WITHOUT CARDIOVERSION N/A 06/12/2015   Procedure: TRANSESOPHAGEAL ECHOCARDIOGRAM (TEE) - WITH CARDIOVERSION;  Surgeon: Sanda Klein, MD;  Location: [place];  Service: Cardiovascular: EF 55-60%.No LA or LAA thrombus. Normal Peal PAP ~33 mmHg.  [name] TOTAL KNEE ARTHROPLASTY  05/09/2012   Procedure: TOTAL KNEE ARTHROPLASTY;  Surgeon: Kerin Salen, MD;  Location: [place];  Service: Orthopedics;  Laterality: Right;  . TRANSTHORACIC ECHOCARDIOGRAM  06/2015   EF 60-65%. Mild LA dilation. Normal PA pressures: 32 mmHg. Aortic sclerosis but no stenosis.  [name] wisdom teeth extraccted      Allergies  Allergen Reactions  . Crab [Shellfish Allergy] Rash  . Hydrocodone Hives  . Adhesive [Tape] Rash  . Oxycodone Rash    Allergies as of 09/22/2020      Reactions   Crab [shellfish Allergy] Rash   Hydrocodone Hives   Adhesive [tape] Rash   Oxycodone Rash      Medication List       Accurate as of September 22, 2020 11:59 AM. If you have any questions, ask your nurse or doctor.        acetaminophen 500 MG tablet Commonly known as: TYLENOL Take 1,000 mg by mouth. Three times a day as needed   atorvastatin 40 MG tablet Commonly known as: LIPITOR TAKE 1 TABLET DAILY AT BEDTIME   barrier cream Crea Commonly known as: non-specified Apply 1 application topically. Zinc Oxide to buttocks area when assisting resident with toileting for redness every shift   Biofreeze 4 % Gel Generic drug: Menthol (Topical Analgesic) Apply 1 application topically in the morning, at noon, in the evening, and at bedtime. Apply to right great toe   diltiazem 240 MG 24 hr capsule Commonly known as: DILACOR XR Take 240 mg by mouth daily. For heart failure   Eliquis 5 MG Tabs tablet Generic drug: apixaban TAKE 1 TABLET TWICE A DAY (SCHEDULE FOLLOW UP WITH CARDIOLOGIST PRIOR TO NEXT REFILL REQUEST)   esomeprazole 40 MG capsule Commonly known as: NEXIUM TAKE 1 CAPSULE DAILY   ferrous sulfate 325 (65 FE) MG  tablet Take 325 mg by mouth daily with breakfast. Mon, Wed, Fri   furosemide 40 MG tablet Commonly known as: LASIX Take 40 mg by mouth daily.   losartan 50 MG tablet Commonly known as: COZAAR Take 50 mg by mouth daily.   magnesium oxide 400 MG tablet Commonly known as: MAG-OX Take 200 mg by mouth daily. 1/2 tablet   mirabegron ER 25 MG Tb24 tablet Commonly known as: MYRBETRIQ Take 25 mg by mouth daily.   MIRALAX PO Take 17 g by mouth daily.   Nitrostat 0.4 MG SL tablet Generic drug: nitroGLYCERIN USE AS NEEDED   nystatin cream Commonly known as: MYCOSTATIN Apply 1 application topically 2 (two) times daily as needed for dry skin. Apply to corners of mouth rash  OXYGEN Inhale 2 L into the lungs continuous. For obstructive sleep apnea   PreviDent 5000 Booster Plus 1.1 % Pste Generic drug: Sodium Fluoride Place 1 application onto teeth at bedtime.   tamsulosin 0.4 MG Caps capsule Commonly known as: FLOMAX Take 0.4 mg by mouth daily.   triamcinolone cream 0.1 % Commonly known as: KENALOG Apply 1 application topically 2 (two) times daily as needed. Apply to corners of mouth rash   Vitamin D3 25 MCG (1000 UT) Caps Take 1 capsule by mouth daily.       Review of Systems  Review of Systems  Constitutional: Negative for activity change, appetite change, chills, diaphoresis, fatigue and fever.  HENT: Negative for mouth sores, postnasal drip, rhinorrhea, sinus pain and sore throat.   Respiratory: Negative for apnea, cough, chest tightness, shortness of breath and wheezing.   Cardiovascular: Negative for chest pain, palpitations and leg swelling.  Gastrointestinal: Negative for abdominal distention, abdominal pain, constipation, diarrhea, nausea and vomiting.  Genitourinary: Negative for dysuria and frequency.  Musculoskeletal: Negative for arthralgias, joint swelling and myalgias.  Skin: Negative for rash.  Neurological: Negative for dizziness, syncope, weakness,  light-headedness and numbness.  Psychiatric/Behavioral: Negative for behavioral problems, confusion and sleep disturbance.     Immunization History  Administered Date(s) Administered  . H1N1 04/13/2010  . Influenza, High Dose Seasonal PF 02/12/2014, 03/29/2018, 01/05/2019  . Influenza-Unspecified 03/06/2013, 02/12/2014, 03/06/2017  . Moderna Sars-Covid-2 Vaccination 06/08/2019, 07/06/2019, 04/14/2020  . Pneumococcal Conjugate-13 02/12/2014  . Pneumococcal Polysaccharide-23 02/04/2013, 02/12/2014  . Td 09/04/2005  . Tdap 12/11/2012  . Zoster 07/07/2006   Pertinent  Health Maintenance Due  Topic Date Due  . INFLUENZA VACCINE  01/04/2021  . PNA vac Low Risk Adult  Completed   No flowsheet data found. Functional Status Survey:    Vitals:   09/22/20 1151  BP: 116/70  Pulse: 76  Resp: 18  Temp: (!) 97 F (36.1 C)  SpO2: 92%  Weight: 210 lb 12.8 oz (95.6 kg)  Height: 5\' 7"  (1.702 m)   Body mass index is 33.02 kg/m. Physical Exam  Constitutional: Oriented to person, place, and time. Well-developed and well-nourished.  HENT:  Head: Normocephalic.  Mouth/Throat: Oropharynx is clear and moist.  Eyes: Pupils are equal, round, and reactive to light.  Neck: Neck supple.  Cardiovascular: Normal rate and normal heart sounds.  No murmur heard. Pulmonary/Chest: Effort normal and breath sounds normal. No respiratory distress. No wheezes. She has no rales.  Abdominal: Soft. Bowel sounds are normal. No distension. There is no tenderness. There is no rebound.  Musculoskeletal: No edema. Thick Nails Lymphadenopathy: none Neurological: Alert and oriented to person, place, and time.  Skin: Skin is warm and dry.  Psychiatric: Normal mood and affect. Behavior is normal. Thought content normal.    Labs reviewed: Recent Labs    10/02/19 0000 04/01/20 0000 06/18/20 0000 07/23/20 0000  NA 138 140 139  --   K 3.7 4.0 4.0  --   CL 100 100 101  --   CO2 30* 32* 29*  --   BUN 13 19  20   --   CREATININE 0.7 0.9 1.0  --   CALCIUM 8.8 9.2 8.9  --   MG 1.5  --   --  1.6   Recent Labs    10/02/19 0000 04/01/20 0000 06/18/20 0000  AST 9* 10* 10*  ALT 4* 4* 4*  ALKPHOS 75 72 65  ALBUMIN 3.3* 3.5 3.6   Recent Labs  10/02/19 0000 02/18/20 0000 04/01/20 0000 06/18/20 0000  WBC 8.9 8.9 8.6 9.5  NEUTROABS 6,070  --   --  6,755.00  HGB 12.9* 14.0 14.0 13.4*  HCT 37* 40* 39* 39*  PLT 200 217 189 234   Lab Results  Component Value Date   TSH 2.58 10/02/2019   No results found for: HGBA1C Lab Results  Component Value Date   CHOL 129 06/18/2020   HDL 39 06/18/2020   LDLCALC 73 06/18/2020   TRIG 86 06/18/2020   CHOLHDL 2.9 02/12/2014    Significant Diagnostic Results in last 30 days:  No results found.  Assessment/Plan Diastolic congestive heart failure, unspecified HF chronicity (HCC) On Lasix stays stable Persistent atrial fibrillation ([place]):  CHA2DS2-VASc Score 4; On Eliquis On Eliquis Gastroesophageal reflux disease, unspecified whether esophagitis present Continue Nexium Anxiety Uses Low dose of Xanax Essential hypertension On Cozaar and Cardizem OSA (obstructive sleep apnea) Does not use his CPAP Iron deficiency anemia, unspecified iron deficiency anemia type Hgb good ILD (interstitial lung disease) (HCC) Chronic Oxygen Does not need to follow with Pulmonary Primary osteoarthritis of right knee Tylenol  HLD On Statin  CAD S/P percutaneous coronary angioplasty On Statin and Eliquis Urinary Incontinence On Myrbetriq and Flomax   Family/ staff Communication:   Labs/tests ordered:

## 2020-10-12 ENCOUNTER — Non-Acute Institutional Stay (SKILLED_NURSING_FACILITY): Payer: Medicare Other | Admitting: Nurse Practitioner

## 2020-10-12 ENCOUNTER — Encounter: Payer: Self-pay | Admitting: Nurse Practitioner

## 2020-10-12 DIAGNOSIS — N3942 Incontinence without sensory awareness: Secondary | ICD-10-CM | POA: Diagnosis not present

## 2020-10-12 DIAGNOSIS — K219 Gastro-esophageal reflux disease without esophagitis: Secondary | ICD-10-CM

## 2020-10-12 DIAGNOSIS — I5030 Unspecified diastolic (congestive) heart failure: Secondary | ICD-10-CM

## 2020-10-12 DIAGNOSIS — M1711 Unilateral primary osteoarthritis, right knee: Secondary | ICD-10-CM | POA: Diagnosis not present

## 2020-10-12 DIAGNOSIS — J849 Interstitial pulmonary disease, unspecified: Secondary | ICD-10-CM

## 2020-10-12 DIAGNOSIS — F419 Anxiety disorder, unspecified: Secondary | ICD-10-CM

## 2020-10-12 DIAGNOSIS — G4733 Obstructive sleep apnea (adult) (pediatric): Secondary | ICD-10-CM

## 2020-10-12 DIAGNOSIS — I10 Essential (primary) hypertension: Secondary | ICD-10-CM

## 2020-10-12 DIAGNOSIS — K5900 Constipation, unspecified: Secondary | ICD-10-CM | POA: Diagnosis not present

## 2020-10-12 DIAGNOSIS — D509 Iron deficiency anemia, unspecified: Secondary | ICD-10-CM

## 2020-10-12 DIAGNOSIS — I4819 Other persistent atrial fibrillation: Secondary | ICD-10-CM

## 2020-10-12 NOTE — Assessment & Plan Note (Signed)
stable, on MiraLax qd. 

## 2020-10-12 NOTE — Assessment & Plan Note (Signed)
stable, on Fe.Hgb13.4 06/18/20  

## 2020-10-12 NOTE — Assessment & Plan Note (Signed)
Afib, heart rate is in control, on Diltiazem 240mg qd, Eliquis 5mg bid.   

## 2020-10-12 NOTE — Assessment & Plan Note (Signed)
blood pressure is controlled on Losartan 50mg qd, Diltiazem 240mg qd, Furosemide 40mg qd.Bun/creat 20/0.99 06/18/20  

## 2020-10-12 NOTE — Progress Notes (Signed)
Location:   SNF [place] Room Number: 5 Place of Service:  SNF (31) Provider: [facility] Rotunda Worden NP  Virgie Dad, MD  Patient Care Team: Virgie Dad, MD as PCP - General (Internal Medicine) Leonie Truly Stankiewicz, MD as PCP - Cardiology (Cardiology)  Extended Emergency Contact Information Primary Emergency Contact: [facility] Address: [address]          [place], [place] [address] [name] of [place] Phone: [phone] Mobile Phone: [phone] Relation: Spouse Secondary Emergency Contact: Janey Genta Address: [address]          [place], [place] Phone: [phone] Mobile Phone: [phone] Relation: Daughter  Code Status:  DNR Goals of care: Advanced Directive information Advanced Directives 09/22/2020  Does Patient Have a Medical Advance Directive? Yes  Type of Advance Directive Living will;Healthcare Power of Attorney  Does patient want to make changes to medical advance directive? No - Patient declined  Copy of [place] in Chart? Yes - validated most recent copy scanned in chart (See row information)  Would patient like information on creating a medical advance directive? -  Pre-existing out of facility DNR order (yellow form or pink MOST form) -     Chief Complaint  Patient presents with  . Medical Management of Chronic Issues    HPI:  Pt is a 87 y.o. male seen today for medical management of chronic diseases.    Urinary incontinent, no urinary retention, on Mirabegron 25mg  qd, Tamsulosin 0.4mg  qd. Constipation, stable, on MiraLax qd. OA, uses mechanical lift for transfer, takes prn Tylenol.  Hx of Interstitial lung disease, O2 dependent CHF/minimal edema BLE, on Furosamide 40mg  qd.Bun/creat20/0.99 06/18/20 Anxiety,stable.Off alprazolam. Afib, heart rate is in control, on Diltiazem 240mg  qd, Eliquis 5mg   bid.  GERD, stable, on Esomeprazole 40mg  qd.  Anemia, stable, on Fe.Hgb13.4 06/18/20 HTN, blood pressure is controlled on Losartan 50mg  qd, Diltiazem 240mg  qd, Furosemide 40mg  qd.Bun/creat 20/0.99 06/18/20 OSA uses CPAP    Past Medical History:  Diagnosis Date  . Anxiety   . CAD S/P percutaneous coronary angioplasty 1996, 2009, 01/2010   PTCA of L Cx 1996; BMS-RCA [facility]) 2009; Staged PCI RCA (70% ISR) --> 3.0 mm x 23 mm BMS, staged PCI Diag - 2.0 mm x 12 mm Mini Vision BMS (2.25 mm)  . Cataracts, bilateral    immature  . Chronic back pain    scoliosis--pt states unable to have surgery bc of age  . GERD (gastroesophageal reflux disease)    takes Nexium daily  . History of colon polyps   . Hyperlipidemia   . Hypertension   . Myocardial infarction [facility]) 1996, 2009   x 2;last time was about 8-64yrs ago   . Obesity (BMI 30.0-34.9)   . OSA (obstructive sleep apnea)   . Osteoarthritis of both knees   . Peripheral edema    Venous Stasis  . Persistent atrial fibrillation (HCC)    Unable to maintaine NSR after DCCV with Tikosyn.  Plan = rate control w/diltiazem (beta blocker off b/c fatigue). CHA2DS2Vasc 4. DOAC - Eliquis.   . Prostate cancer ([place]) 2005   seed implant   Past Surgical History:  Procedure Laterality Date  . CARDIOVERSION N/A 06/12/2015   Procedure: CARDIOVERSION;  Surgeon: Sanda Klein, MD;  Location: [place] [facility];  Service: Cardiovascular;  Laterality: N/A;  . CARDIOVERSION N/A 08/02/2015   Procedure: CARDIOVERSION;  Surgeon: [name], MD;  Location: [place];  Service: Cardiovascular;  Laterality: N/A;  .  CHOLECYSTECTOMY    . COLONOSCOPY    . CORONARY ANGIOPLASTY WITH STENT PLACEMENT  1996, 2009, August 2011   PTCA of L Cx 1996; BMS-RCA [facility]) 2009; Staged PCI RCA (70% ISR) --> 3.0 mm x 23 mm BMS, staged PCI Diag - 2.0 mm x 12 mm Mini Vision BMS (2.25 mm)  . cyst removed  in college  . NEUROPLASTY / TRANSPOSITION  MEDIAN NERVE AT CARPAL TUNNEL BILATERAL    . NM MYOVIEW LTD  November 2013   EF 53%; fixed mid inferior-inferolateral defect, infarct with no ischemia.  . right knee arthrsoscopy  50yrs ago  . right trigger finger    . seeds placed into prostate   2005  . TEE WITHOUT CARDIOVERSION N/A 06/12/2015   Procedure: TRANSESOPHAGEAL ECHOCARDIOGRAM (TEE) - WITH CARDIOVERSION;  Surgeon: Sanda Klein, MD;  Location: [place];  Service: Cardiovascular: EF 55-60%.No LA or LAA thrombus. Normal Peal PAP ~33 mmHg.  [name] TOTAL KNEE ARTHROPLASTY  05/09/2012   Procedure: TOTAL KNEE ARTHROPLASTY;  Surgeon: Kerin Salen, MD;  Location: [place];  Service: Orthopedics;  Laterality: Right;  . TRANSTHORACIC ECHOCARDIOGRAM  06/2015   EF 60-65%. Mild LA dilation. Normal PA pressures: 32 mmHg. Aortic sclerosis but no stenosis.  [name] wisdom teeth extraccted      Allergies  Allergen Reactions  . Crab [Shellfish Allergy] Rash  . Hydrocodone Hives  . Adhesive [Tape] Rash  . Oxycodone Rash    Allergies as of 10/12/2020      Reactions   Crab [shellfish Allergy] Rash   Hydrocodone Hives   Adhesive [tape] Rash   Oxycodone Rash      Medication List       Accurate as of Oct 12, 2020 11:59 PM. If you have any questions, ask your nurse or doctor.        acetaminophen 500 MG tablet Commonly known as: TYLENOL Take 1,000 mg by mouth. Three times a day as needed   atorvastatin 40 MG tablet Commonly known as: LIPITOR TAKE 1 TABLET DAILY AT BEDTIME   barrier cream Crea Commonly known as: non-specified Apply 1 application topically. Zinc Oxide to buttocks area when assisting resident with toileting for redness every shift   Biofreeze 4 % Gel Generic drug: Menthol (Topical Analgesic) Apply 1 application topically in the morning, at noon, in the evening, and at bedtime. Apply to right great toe   diltiazem 240 MG 24 hr capsule Commonly known as: DILACOR XR Take 240 mg by mouth daily. For heart failure   Eliquis 5  MG Tabs tablet Generic drug: apixaban TAKE 1 TABLET TWICE A DAY (SCHEDULE FOLLOW UP WITH CARDIOLOGIST PRIOR TO NEXT REFILL REQUEST)   esomeprazole 40 MG capsule Commonly known as: NEXIUM TAKE 1 CAPSULE DAILY   ferrous sulfate 325 (65 FE) MG tablet Take 325 mg by mouth daily with breakfast. Mon, Wed, Fri   furosemide 40 MG tablet Commonly known as: LASIX Take 40 mg by mouth daily.   losartan 50 MG tablet Commonly known as: COZAAR Take 50 mg by mouth daily.   magnesium oxide 400 MG tablet Commonly known as: MAG-OX Take 200 mg by mouth daily. 1/2 tablet   mirabegron ER 25 MG Tb24 tablet Commonly known as: MYRBETRIQ Take 25 mg by mouth daily.   MIRALAX PO Take 17 g by mouth daily.   Nitrostat 0.4 MG SL tablet Generic drug: nitroGLYCERIN USE AS NEEDED   nystatin cream Commonly known as: MYCOSTATIN Apply 1 application topically 2 (two) times daily as  needed for dry skin. Apply to corners of mouth rash   OXYGEN Inhale 2 L into the lungs continuous. For obstructive sleep apnea   PreviDent 5000 Booster Plus 1.1 % Pste Generic drug: Sodium Fluoride Place 1 application onto teeth at bedtime.   tamsulosin 0.4 MG Caps capsule Commonly known as: FLOMAX Take 0.4 mg by mouth daily.   triamcinolone cream 0.1 % Commonly known as: KENALOG Apply 1 application topically 2 (two) times daily as needed. Apply to corners of mouth rash   Vitamin D3 25 MCG (1000 UT) Caps Take 1 capsule by mouth daily.       Review of Systems  Constitutional: Negative for fatigue, fever and unexpected weight change.  HENT: Positive for hearing loss. Negative for congestion and voice change.   Eyes: Negative for visual disturbance.  Respiratory: Positive for shortness of breath. Negative for cough and wheezing.        O2 dependent. DOE  Cardiovascular: Positive for leg swelling.  Gastrointestinal: Negative for abdominal pain and constipation.       Incontinent of feces.   Genitourinary:  Negative for dysuria and urgency.       Incontinent of urine.   Musculoskeletal: Positive for arthralgias and gait problem.       Left knee pain is chronic. The right great toe pain at the base of the toe nail, but no warmth, redness, or swelling.   Skin: Negative for color change.  Neurological: Negative for speech difficulty, weakness and light-headedness.       Memory lapses.   Psychiatric/Behavioral: Negative for behavioral problems and sleep disturbance. The patient is not nervous/anxious.     Immunization History  Administered Date(s) Administered  . H1N1 04/13/2010  . Influenza, High Dose Seasonal PF 02/12/2014, 03/29/2018, 01/05/2019  . Influenza-Unspecified 03/06/2013, 02/12/2014, 03/06/2017  . Moderna Sars-Covid-2 Vaccination 06/08/2019, 07/06/2019, 04/14/2020  . Pneumococcal Conjugate-13 02/12/2014  . Pneumococcal Polysaccharide-23 02/04/2013, 02/12/2014  . Td 09/04/2005  . Tdap 12/11/2012  . Zoster 07/07/2006   Pertinent  Health Maintenance Due  Topic Date Due  . INFLUENZA VACCINE  01/04/2021  . PNA vac Low Risk Adult  Completed   No flowsheet data found. Functional Status Survey:    Vitals:   10/12/20 1627  BP: 116/74  Pulse: 67  Resp: (!) 22  Temp: (!) 97.4 F (36.3 C)  SpO2: 94%  Weight: 216 lb (98 kg)   Body mass index is 33.83 kg/m. Physical Exam Vitals and nursing note reviewed.  Constitutional:      Appearance: Normal appearance.  HENT:     Head: Normocephalic and atraumatic.     Mouth/Throat:     Mouth: Mucous membranes are moist.  Eyes:     Extraocular Movements: Extraocular movements intact.     Conjunctiva/sclera: Conjunctivae normal.     Pupils: Pupils are equal, round, and reactive to light.  Cardiovascular:     Rate and Rhythm: Normal rate. Rhythm irregular.     Heart sounds: No murmur heard.     Comments: DP pulses not felt R, right toe pain is chronic.  DP pulse felt L Pulmonary:     Breath sounds: No wheezing.     Comments: O2  dependent.  Abdominal:     General: Bowel sounds are normal.     Palpations: Abdomen is soft.     Tenderness: There is no abdominal tenderness.  Musculoskeletal:     Cervical back: Normal range of motion and neck supple.     Right lower leg:  Edema present.     Left lower leg: Edema present.     Comments: Trace edema BLE., less than prior, near none.  Mechanical lift for transfer.   Skin:    General: Skin is warm and dry.  Neurological:     General: No focal deficit present.     Mental Status: He is alert and oriented to person, place, and time. Mental status is at baseline.     Motor: No weakness.     Coordination: Coordination normal.     Gait: Gait abnormal.  Psychiatric:        Mood and Affect: Mood normal.        Behavior: Behavior normal.        Thought Content: Thought content normal.        Judgment: Judgment normal.     Labs reviewed: Recent Labs    04/01/20 0000 06/18/20 0000 07/23/20 0000  NA 140 139  --   K 4.0 4.0  --   CL 100 101  --   CO2 32* 29*  --   BUN 19 20  --   CREATININE 0.9 1.0  --   CALCIUM 9.2 8.9  --   MG  --   --  1.6   Recent Labs    04/01/20 0000 06/18/20 0000  AST 10* 10*  ALT 4* 4*  ALKPHOS 72 65  ALBUMIN 3.5 3.6   Recent Labs    02/18/20 0000 04/01/20 0000 06/18/20 0000  WBC 8.9 8.6 9.5  NEUTROABS  --   --  6,755.00  HGB 14.0 14.0 13.4*  HCT 40* 39* 39*  PLT 217 189 234   Lab Results  Component Value Date   TSH 2.58 10/02/2019   No results found for: HGBA1C Lab Results  Component Value Date   CHOL 129 06/18/2020   HDL 39 06/18/2020   LDLCALC 73 06/18/2020   TRIG 86 06/18/2020   CHOLHDL 2.9 02/12/2014    Significant Diagnostic Results in last 30 days:  No results found.  Assessment/Plan  Urinary incontinence Urinary incontinent, no urinary retention, on Mirabegron 25mg  qd, Tamsulosin 0.4mg  qd.  Constipation stable, on MiraLax qd.  Osteoarthritis of right knee uses mechanical lift for transfer,  takes prn Tylenol.    ILD (interstitial lung disease) (HCC)  O2 dependent   Diastolic CHF (HCC) CHF/minimal edema BLE, on Furosamide 40mg  qd.Bun/creat20/0.99 06/18/20   Anxiety stable.Off alprazolam.   Persistent atrial fibrillation ([place]):  CHA2DS2-VASc Score 4; On Eliquis Afib, heart rate is in control, on Diltiazem 240mg  qd, Eliquis 5mg  bid.  GERD (gastroesophageal reflux disease) stable, on Esomeprazole 40mg  qd.    Iron deficiency anemia stable, on Fe.Hgb13.4 06/18/20  Essential hypertension blood pressure is controlled on Losartan 50mg  qd, Diltiazem 240mg  qd, Furosemide 40mg  qd.Bun/creat 20/0.99 06/18/20   OSA (obstructive sleep apnea) uses CPAP    Family/ staff Communication: plan of care reviewed with the patient and charge nurse.   Labs/tests ordered:  none  Time spend 35 minutes.

## 2020-10-12 NOTE — Assessment & Plan Note (Signed)
CHF/minimal edema BLE, on Furosamide 40mg  qd.Bun/creat20/0.99 06/18/20

## 2020-10-12 NOTE — Assessment & Plan Note (Signed)
Urinary incontinent, no urinary retention, on Mirabegron 25mg qd, Tamsulosin 0.4mg qd.  

## 2020-10-12 NOTE — Assessment & Plan Note (Signed)
uses mechanical lift for transfer, takes prn Tylenol. 

## 2020-10-12 NOTE — Assessment & Plan Note (Signed)
stable, on Esomeprazole 40mg qd. 

## 2020-10-12 NOTE — Assessment & Plan Note (Signed)
stable.Off alprazolam. 

## 2020-10-12 NOTE — Assessment & Plan Note (Signed)
O2 dependent.

## 2020-10-12 NOTE — Assessment & Plan Note (Signed)
uses CPAP 

## 2020-10-13 ENCOUNTER — Encounter: Payer: Self-pay | Admitting: Nurse Practitioner

## 2020-11-09 ENCOUNTER — Encounter: Payer: Self-pay | Admitting: Nurse Practitioner

## 2020-11-09 ENCOUNTER — Non-Acute Institutional Stay (SKILLED_NURSING_FACILITY): Payer: Medicare Other | Admitting: Nurse Practitioner

## 2020-11-09 DIAGNOSIS — I10 Essential (primary) hypertension: Secondary | ICD-10-CM

## 2020-11-09 DIAGNOSIS — G4733 Obstructive sleep apnea (adult) (pediatric): Secondary | ICD-10-CM

## 2020-11-09 DIAGNOSIS — D509 Iron deficiency anemia, unspecified: Secondary | ICD-10-CM

## 2020-11-09 DIAGNOSIS — J849 Interstitial pulmonary disease, unspecified: Secondary | ICD-10-CM

## 2020-11-09 DIAGNOSIS — K219 Gastro-esophageal reflux disease without esophagitis: Secondary | ICD-10-CM | POA: Diagnosis not present

## 2020-11-09 DIAGNOSIS — I4819 Other persistent atrial fibrillation: Secondary | ICD-10-CM

## 2020-11-09 DIAGNOSIS — N3942 Incontinence without sensory awareness: Secondary | ICD-10-CM

## 2020-11-09 DIAGNOSIS — I5030 Unspecified diastolic (congestive) heart failure: Secondary | ICD-10-CM

## 2020-11-09 DIAGNOSIS — M1711 Unilateral primary osteoarthritis, right knee: Secondary | ICD-10-CM

## 2020-11-09 DIAGNOSIS — F419 Anxiety disorder, unspecified: Secondary | ICD-10-CM

## 2020-11-09 DIAGNOSIS — K5900 Constipation, unspecified: Secondary | ICD-10-CM

## 2020-11-09 NOTE — Assessment & Plan Note (Signed)
/  minimal edema BLE, on Furosamide 40mg  qd.Bun/creat20/0.99 06/18/20

## 2020-11-09 NOTE — Assessment & Plan Note (Signed)
stable, on MiraLax qd. 

## 2020-11-09 NOTE — Assessment & Plan Note (Signed)
stable, on Esomeprazole 40mg  qd.

## 2020-11-09 NOTE — Assessment & Plan Note (Signed)
stable.Off alprazolam.

## 2020-11-09 NOTE — Assessment & Plan Note (Signed)
uses mechanical lift for transfer, takes prn Tylenol. 

## 2020-11-09 NOTE — Assessment & Plan Note (Signed)
stable, on Fe.Hgb13.4 06/18/20

## 2020-11-09 NOTE — Assessment & Plan Note (Signed)
no urinary retention, on Mirabegron 25mg  qd, Tamsulosin 0.4mg  qd.

## 2020-11-09 NOTE — Assessment & Plan Note (Signed)
heart rate is in control, on Diltiazem 240mg  qd, Eliquis 5mg  bid. Update TSH, CBC/diff.

## 2020-11-09 NOTE — Progress Notes (Signed)
Location:   [facility] Room Number: N005 Place of Service:  SNF (31) Provider:   X , NP    Patient Care Team: Gupta, Anjali L, MD as PCP - General (Internal Medicine) Harding, David W, MD as PCP - Cardiology (Cardiology)  Extended Emergency Contact Information Primary Emergency Contact: Rengel,Gail Address: [address]          [place], [place] [address] [place] Home Phone: [phone] Mobile Phone: [phone] Relation: Spouse Secondary Emergency Contact: [name],[name] Address: [address]          [place], [place] Home Phone: [phone] Mobile Phone: [phone] Relation: Daughter  Code Status:  FULL CODE Goals of care: Advanced Directive information Advanced Directives 11/09/2020  Does Patient Have a Medical Advance Directive? Yes  Type of Advance Directive Healthcare Power of Attorney;Living will  Does patient want to make changes to medical advance directive? No - Patient declined  Copy of Healthcare Power of Attorney in Chart? Yes - validated most recent copy scanned in chart (See row information)  Would patient like information on creating a medical advance directive? -  Pre-existing out of facility DNR order (yellow form or pink MOST form) -     Chief Complaint  Patient presents with  . Medical agement of Chronic Issues    Routine follow up.  . Health Maintenance    Discuss need for shingles vaccine and  PNA vaccine.     HPI:  Pt is a 87 y.o. male seen today for medical management of chronic diseases.      Urinary incontinent, no urinary retention, on Mirabegron 25mg qd, Tamsulosin 0.4mg qd. Constipation, stable, on MiraLax qd. OA, uses mechanical lift for transfer, takes prn Tylenol.  Hx of Interstitial lung disease, O2 dependent CHF/minimal edema BLE, on Furosamide 40mg qd.Bun/creat20/0.99  06/18/20 Anxiety,stable.Off alprazolam. Afib, heart rate is in control, on Diltiazem 240mg qd, Eliquis 5mg bid.  GERD, stable, on Esomeprazole 40mg qd.  Anemia, stable, on Fe.Hgb13.4 06/18/20 HTN, blood pressure is controlled on Losartan 50mg qd, Diltiazem 240mg qd, Furosemide 40mg qd.Bun/creat 20/0.99 06/18/20 OSA uses CPAP   Past Medical History:  Diagnosis Date  . Anxiety   . CAD S/P percutaneous coronary angioplasty 1996, 2009, 01/2010   PTCA of L Cx 1996; BMS-RCA ([place]) 2009; Staged PCI RCA (70% ISR) --> 3.0 mm x 23 mm BMS, staged PCI Diag - 2.0 mm x 12 mm Mini Vision BMS (2.25 mm)  . Cataracts, bilateral    immature  . Chronic back pain    scoliosis--pt states unable to have surgery bc of age  . GERD (gastroesophageal reflux disease)    takes Nexium daily  . History of colon polyps   . Hyperlipidemia   . Hypertension   . Myocardial infarction (HCC) 1996, 2009   x 2;last time was about 8-10yrs ago   . Obesity (BMI 30.0-34.9)   . OSA (obstructive sleep apnea)   . Osteoarthritis of both knees   . Peripheral edema    Venous Stasis  . Persistent atrial fibrillation (HCC)    Unable to maintaine NSR after DCCV with Tikosyn.  Plan = rate control w/diltiazem (beta blocker off b/c fatigue). CHA2DS2Vasc 4. DOAC - Eliquis.   . Prostate cancer (HCC) 2005   seed implant   Past Surgical History:  Procedure Laterality Date  . CARDIOVERSION N/A 06/12/2015   Procedure: CARDIOVERSION;  Surgeon: Mihai Croitoru, MD;  Location: [facility];  Service: Cardiovascular;  Laterality:   N/A;  . CARDIOVERSION N/A 08/02/2015   Procedure: CARDIOVERSION;  Surgeon: [name], MD;  Location: [place];  Service: Cardiovascular;  Laterality: N/A;  . CHOLECYSTECTOMY    . COLONOSCOPY    . CORONARY ANGIOPLASTY WITH STENT PLACEMENT  1996, 2009, August 2011   PTCA of L Cx 1996; BMS-RCA [facility]) 2009; Staged PCI RCA (70% ISR) --> 3.0 mm x  23 mm BMS, staged PCI Diag - 2.0 mm x 12 mm Mini Vision BMS (2.25 mm)  . cyst removed  in college  . NEUROPLASTY / TRANSPOSITION MEDIAN NERVE AT CARPAL TUNNEL BILATERAL    . NM MYOVIEW LTD  November 2013   EF 53%; fixed mid inferior-inferolateral defect, infarct with no ischemia.  . right knee arthrsoscopy  14yr ago  . right trigger finger    . seeds placed into prostate   2005  . TEE WITHOUT CARDIOVERSION N/A 06/12/2015   Procedure: TRANSESOPHAGEAL ECHOCARDIOGRAM (TEE) - WITH CARDIOVERSION;  Surgeon: MSanda Klein MD;  [facility]  Service: Cardiovascular: EF 55-60%.No LA or LAA thrombus. Normal Peal PAP ~33 mmHg.  .Marland KitchenTOTAL KNEE ARTHROPLASTY  05/09/2012   Procedure: TOTAL KNEE ARTHROPLASTY;  Surgeon: FKerin Salen MD;  [facility]  Service: Orthopedics;  Laterality: Right;  . TRANSTHORACIC ECHOCARDIOGRAM  06/2015   EF 60-65%. Mild LA dilation. Normal PA pressures: 32 mmHg. Aortic sclerosis but no stenosis.  .Marland Kitchenwisdom teeth extraccted      Allergies  Allergen Reactions  . Crab [Shellfish Allergy] Rash  . Hydrocodone Hives  . Adhesive [Tape] Rash  . Oxycodone Rash    Allergies as of 11/09/2020      Reactions   Crab [shellfish Allergy] Rash   Hydrocodone Hives   Adhesive [tape] Rash   Oxycodone Rash      Medication List       Accurate as of November 09, 2020 11:59 PM. If you have any questions, ask your nurse or doctor.        STOP taking these medications   barrier cream Crea Commonly known as: non-specified Stopped by: Erhard Senske X Lazlo Tunney, NP   OXYGEN Stopped by: Faustino Luecke X Nasirah Sachs, NP     TAKE these medications   acetaminophen 500 MG tablet Commonly known as: TYLENOL Take 1,000 mg by mouth. Three times a day as needed   atorvastatin 40 MG tablet Commonly known as: LIPITOR TAKE 1 TABLET DAILY AT BEDTIME   Biofreeze 4 % Gel Generic drug: Menthol (Topical Analgesic) Apply 1 application topically in the morning, at noon, in the evening, and at bedtime. Apply to right  great toe   diltiazem 240 MG 24 hr capsule Commonly known as: DILACOR XR Take 240 mg by mouth daily. For heart failure   Eliquis 5 MG Tabs tablet Generic drug: apixaban TAKE 1 TABLET TWICE A DAY (SCHEDULE FOLLOW UP WITH CARDIOLOGIST PRIOR TO NEXT REFILL REQUEST)   esomeprazole 40 MG capsule Commonly known as: NEXIUM TAKE 1 CAPSULE DAILY   ferrous sulfate 325 (65 FE) MG tablet Take 325 mg by mouth daily with breakfast. Mon, Wed, Fri   furosemide 40 MG tablet Commonly known as: LASIX Take 40 mg by mouth daily.   losartan 50 MG tablet Commonly known as: COZAAR Take 50 mg by mouth daily.   magnesium oxide 400 MG tablet Commonly known as: MAG-OX Take 200 mg by mouth daily. 1/2 tablet   mirabegron ER 25 MG Tb24 tablet Commonly known as: MYRBETRIQ Take 25 mg by mouth daily.   MIRALAX  PO Take 17 g by mouth daily.   Nitrostat 0.4 MG SL tablet Generic drug: nitroGLYCERIN USE AS NEEDED   nystatin cream Commonly known as: MYCOSTATIN Apply 1 application topically 2 (two) times daily as needed for dry skin. Apply to corners of mouth rash   PreviDent 5000 Booster Plus 1.1 % Pste Generic drug: Sodium Fluoride Place 1 application onto teeth at bedtime.   tamsulosin 0.4 MG Caps capsule Commonly known as: FLOMAX Take 0.4 mg by mouth daily.   triamcinolone cream 0.1 % Commonly known as: KENALOG Apply 1 application topically 2 (two) times daily as needed. Apply to corners of mouth rash   Vitamin D3 25 MCG (1000 UT) Caps Take 1 capsule by mouth daily.       Review of Systems  Constitutional: Negative for activity change, fever and unexpected weight change.  HENT: Positive for hearing loss. Negative for congestion and voice change.   Eyes: Negative for visual disturbance.  Respiratory: Positive for shortness of breath. Negative for cough.        O2 dependent. DOE  Cardiovascular: Positive for leg swelling.  Gastrointestinal: Negative for abdominal pain and constipation.        Incontinent of feces.   Genitourinary: Negative for dysuria and urgency.       Incontinent of urine.   Musculoskeletal: Positive for arthralgias and gait problem.       Left knee pain is chronic. The right great toe pain at the base of the toe nail, but no warmth, redness, or swelling.   Skin: Negative for color change.  Neurological: Negative for speech difficulty, weakness and headaches.       Memory lapses.   Psychiatric/Behavioral: Negative for behavioral problems and sleep disturbance. The patient is not nervous/anxious.     Immunization History  Administered Date(s) Administered  . H1N1 04/13/2010  . Influenza, High Dose Seasonal PF 02/12/2014, 03/29/2018, 01/05/2019  . Influenza-Unspecified 03/06/2013, 02/12/2014, 03/06/2017  . Moderna Sars-Covid-2 Vaccination 06/08/2019, 07/06/2019, 04/14/2020  . Pneumococcal Conjugate-13 02/12/2014  . Pneumococcal Polysaccharide-23 02/04/2013, 02/12/2014  . Td 09/04/2005  . Tdap 12/11/2012  . Zoster, Live 07/07/2006   Pertinent  Health Maintenance Due  Topic Date Due  . INFLUENZA VACCINE  01/04/2021  . PNA vac Low Risk Adult  Completed   No flowsheet data found. Functional Status Survey:    Vitals:   11/09/20 0859  BP: 134/78  Pulse: 71  Resp: 18  Temp: (!) 97.5 F (36.4 C)  SpO2: 94%  Weight: 217 lb 9.6 oz (98.7 kg)  Height: 5' 6" (1.676 m)   Body mass index is 35.12 kg/m. Physical Exam Vitals and nursing note reviewed.  Constitutional:      Appearance: Normal appearance.  HENT:     Head: Normocephalic and atraumatic.     Mouth/Throat:     Mouth: Mucous membranes are moist.  Eyes:     Extraocular Movements: Extraocular movements intact.     Conjunctiva/sclera: Conjunctivae normal.     Pupils: Pupils are equal, round, and reactive to light.  Cardiovascular:     Rate and Rhythm: Normal rate. Rhythm irregular.     Heart sounds: No murmur heard.     Comments: DP pulses not felt R, right toe pain is chronic.   DP pulse felt L Pulmonary:     Breath sounds: No wheezing.     Comments: O2 dependent.  Abdominal:     General: Bowel sounds are normal.     Palpations: Abdomen is soft.       Tenderness: There is no abdominal tenderness.  Musculoskeletal:     Cervical back: Normal range of motion and neck supple.     Right lower leg: Edema present.     Left lower leg: Edema present.     Comments: Trace edema BLE., less than prior, near none.  Mechanical lift for transfer.   Skin:    General: Skin is warm and dry.  Neurological:     General: No focal deficit present.     Mental Status: He is alert and oriented to person, place, and time. Mental status is at baseline.     Motor: No weakness.     Coordination: Coordination normal.     Gait: Gait abnormal.  Psychiatric:        Mood and Affect: Mood normal.        Behavior: Behavior normal.        Thought Content: Thought content normal.        Judgment: Judgment normal.     Labs reviewed: Recent Labs    04/01/20 0000 06/18/20 0000 07/23/20 0000  NA 140 139  --   K 4.0 4.0  --   CL 100 101  --   CO2 32* 29*  --   BUN 19 20  --   CREATININE 0.9 1.0  --   CALCIUM 9.2 8.9  --   MG  --   --  1.6   Recent Labs    04/01/20 0000 06/18/20 0000  AST 10* 10*  ALT 4* 4*  ALKPHOS 72 65  ALBUMIN 3.5 3.6   Recent Labs    02/18/20 0000 04/01/20 0000 06/18/20 0000  WBC 8.9 8.6 9.5  NEUTROABS  --   --  6,755.00  HGB 14.0 14.0 13.4*  HCT 40* 39* 39*  PLT 217 189 234   Lab Results  Component Value Date   TSH 2.58 10/02/2019   No results found for: HGBA1C Lab Results  Component Value Date   CHOL 129 06/18/2020   HDL 39 06/18/2020   LDLCALC 73 06/18/2020   TRIG 86 06/18/2020   CHOLHDL 2.9 02/12/2014    Significant Diagnostic Results in last 30 days:  No results found.  Assessment/Plan Persistent atrial fibrillation (HCC):  CHA2DS2-VASc Score 4; On Eliquis heart rate is in control, on Diltiazem 240mg qd, Eliquis 5mg bid. Update  TSH, CBC/diff.    GERD (gastroesophageal reflux disease)  stable, on Esomeprazole 40mg qd.   Iron deficiency anemia stable, on Fe.Hgb13.4 06/18/20   Essential hypertension blood pressure is controlled on Losartan 50mg qd, Diltiazem 240mg qd, Furosemide 40mg qd.Bun/creat 20/0.99 06/18/20, update CMP/eGFR, lipid panel.   OSA (obstructive sleep apnea) Uses CPAP  Anxiety stable.Off alprazolam.  Diastolic CHF (HCC) /minimal edema BLE, on Furosamide 40mg qd.Bun/creat20/0.99 06/18/20  ILD (interstitial lung disease) (HCC) Hx of Interstitial lung disease, O2 dependent  Osteoarthritis of right knee uses mechanical lift for transfer, takes prn Tylenol.    Constipation  stable, on MiraLax qd.   Urinary incontinence  no urinary retention, on Mirabegron 25mg qd, Tamsulosin 0.4mg qd.     Family/ staff Communication: plan of care reviewed with the patient and charge nurse.   Labs/tests ordered:  CBC/diff, CMP/eGFR, TSH, lipid panel.   Time spend 35 minutes.     

## 2020-11-09 NOTE — Assessment & Plan Note (Signed)
Hx of Interstitial lung disease, O2 dependent

## 2020-11-09 NOTE — Assessment & Plan Note (Signed)
Uses CPAP 

## 2020-11-09 NOTE — Assessment & Plan Note (Signed)
blood pressure is controlled on Losartan 57m qd, Diltiazem 2466mqd, Furosemide 4071md.Bun/creat 20/0.99 06/18/20, update CMP/eGFR, lipid panel.

## 2020-11-11 LAB — HEPATIC FUNCTION PANEL
ALT: 4 — AB (ref 10–40)
AST: 9 — AB (ref 14–40)
Alkaline Phosphatase: 63 (ref 25–125)
Bilirubin, Total: 0.9

## 2020-11-11 LAB — LIPID PANEL
Cholesterol: 116 (ref 0–200)
HDL: 37 (ref 35–70)
LDL Cholesterol: 63
LDl/HDL Ratio: 3.1
Triglycerides: 84 (ref 40–160)

## 2020-11-11 LAB — CBC: RBC: 4.29 (ref 3.87–5.11)

## 2020-11-11 LAB — BASIC METABOLIC PANEL
BUN: 20 (ref 4–21)
CO2: 27 — AB (ref 13–22)
Chloride: 101 (ref 99–108)
Creatinine: 1 (ref 0.6–1.3)
Glucose: 93
Potassium: 4 (ref 3.4–5.3)
Sodium: 140 (ref 137–147)

## 2020-11-11 LAB — TSH: TSH: 3.33 (ref 0.41–5.90)

## 2020-11-11 LAB — COMPREHENSIVE METABOLIC PANEL
Albumin: 3.5 (ref 3.5–5.0)
Calcium: 8.9 (ref 8.7–10.7)
GFR calc Af Amer: 82
GFR calc non Af Amer: 71
Globulin: 2.5

## 2020-11-11 LAB — CBC AND DIFFERENTIAL
HCT: 42 (ref 41–53)
Hemoglobin: 14.2 (ref 13.5–17.5)
Neutrophils Absolute: 4358
Platelets: 205 (ref 150–399)
WBC: 7.3

## 2020-12-10 ENCOUNTER — Non-Acute Institutional Stay (SKILLED_NURSING_FACILITY): Payer: Medicare Other | Admitting: Nurse Practitioner

## 2020-12-10 ENCOUNTER — Encounter: Payer: Self-pay | Admitting: Nurse Practitioner

## 2020-12-10 DIAGNOSIS — I10 Essential (primary) hypertension: Secondary | ICD-10-CM

## 2020-12-10 DIAGNOSIS — K5900 Constipation, unspecified: Secondary | ICD-10-CM | POA: Diagnosis not present

## 2020-12-10 DIAGNOSIS — F419 Anxiety disorder, unspecified: Secondary | ICD-10-CM

## 2020-12-10 DIAGNOSIS — H00012 Hordeolum externum right lower eyelid: Secondary | ICD-10-CM | POA: Insufficient documentation

## 2020-12-10 DIAGNOSIS — I5030 Unspecified diastolic (congestive) heart failure: Secondary | ICD-10-CM

## 2020-12-10 DIAGNOSIS — G4733 Obstructive sleep apnea (adult) (pediatric): Secondary | ICD-10-CM

## 2020-12-10 DIAGNOSIS — N3942 Incontinence without sensory awareness: Secondary | ICD-10-CM | POA: Diagnosis not present

## 2020-12-10 DIAGNOSIS — I4819 Other persistent atrial fibrillation: Secondary | ICD-10-CM

## 2020-12-10 DIAGNOSIS — K219 Gastro-esophageal reflux disease without esophagitis: Secondary | ICD-10-CM

## 2020-12-10 DIAGNOSIS — M1711 Unilateral primary osteoarthritis, right knee: Secondary | ICD-10-CM | POA: Diagnosis not present

## 2020-12-10 DIAGNOSIS — D509 Iron deficiency anemia, unspecified: Secondary | ICD-10-CM

## 2020-12-10 DIAGNOSIS — J849 Interstitial pulmonary disease, unspecified: Secondary | ICD-10-CM

## 2020-12-10 NOTE — Progress Notes (Signed)
Location:   [facility] Room Number: [phone] Place of Service:  SNF (31) Provider:  Justyne Roell X Yuka Lallier    Patient Care Team: Virgie Dad, MD as PCP - General (Internal Medicine) Leonie Takeo Harts, MD as PCP - Cardiology (Cardiology)  Extended Emergency Contact Information Primary Emergency Contact: [facility] Address: [address]          [place], [place] [address] [name] of [place] Phone: [phone] Mobile Phone: [phone] Relation: Spouse Secondary Emergency Contact: Janey Genta Address: [address]          [place], [place] Phone: [phone] Mobile Phone: [phone] Relation: Daughter  Code Status:  DNR Goals of care: Advanced Directive information Advanced Directives 12/10/2020  Does Patient Have a Medical Advance Directive? Yes  Type of Paramedic of [place];Living will  Does patient want to make changes to medical advance directive? No - Patient declined  Copy of [place] in Chart? Yes - validated most recent copy scanned in chart (See row information)  Would patient like information on creating a medical advance directive? -  Pre-existing out of facility DNR order (yellow form or pink MOST form) -     Chief Complaint  Patient presents with   Acute Visit    Patients presents for a stye on right eye    HPI:  Pt is a 87 y.o. male seen today for an acute visit for reported the right lower eye lid sty, the patient desires oint to help heal faster. Denied pain in eyes or change of vision, already using warm compress and baby shampoo to cleanse eyelids.       Urinary incontinent, no urinary retention, on Mirabegron 25mg  qd, Tamsulosin 0.4mg  qd.              Constipation, stable, on MiraLax qd.              OA, uses mechanical lift for transfer, takes prn Tylenol.             Hx of Interstitial lung disease, O2 dependent                  CHF/minimal edema BLE, on Furosamide 40mg  qd. Bun/creat 20/0.95 11/10/20             Anxiety, stable. Off alprazolam.              Afib, heart rate is in control, on Diltiazem 240mg  qd, Eliquis 5mg  bid.             GERD, stable, on Esomeprazole 40mg  qd.             Anemia, stable, on Fe. Hgb 14.2 11/10/20             HTN, blood pressure is controlled on Losartan 50mg  qd, Diltiazem 240mg  qd, Furosemide 40mg  qd. Bun/creat 20/0.95 11/10/20             OSA uses CPAP    Past Medical History:  Diagnosis Date   Anxiety    CAD S/P percutaneous coronary angioplasty 1996, 2009, 01/2010   PTCA of L Cx 1996; BMS-RCA [facility]) 2009; Staged PCI RCA (70% ISR) --> 3.0 mm x 23 mm BMS, staged PCI Diag - 2.0 mm x 12 mm Mini Vision BMS (2.25 mm)   Cataracts, bilateral    immature   Chronic back pain    scoliosis--pt states unable to have surgery bc of age  GERD (gastroesophageal reflux disease)    takes Nexium daily   History of colon polyps    Hyperlipidemia    Hypertension    Myocardial infarction ([facility]) 1996, 2009   x 2;last time was about 8-4yrs ago    Obesity (BMI 30.0-34.9)    OSA (obstructive sleep apnea)    Osteoarthritis of both knees    Peripheral edema    Venous Stasis   Persistent atrial fibrillation ([place] [facility])    Unable to maintaine NSR after DCCV with Tikosyn.  Plan = rate control w/diltiazem (beta blocker off b/c fatigue). CHA2DS2Vasc 4. DOAC - Eliquis.    Prostate cancer ([facility]) 2005   seed implant   Past Surgical History:  Procedure Laterality Date   CARDIOVERSION N/A 06/12/2015   Procedure: CARDIOVERSION;  Surgeon: Sanda Klein, MD;  Location: [facility];  Service: Cardiovascular;  Laterality: N/A;   CARDIOVERSION N/A 08/02/2015   Procedure: CARDIOVERSION;  Surgeon: [name], MD;  Location: [facility];  Service: Cardiovascular;  Laterality: N/A;   [place], 2009, August 2011   PTCA of L Cx 1996; BMS-RCA [facility])  2009; Staged PCI RCA (70% ISR) --> 3.0 mm x 23 mm BMS, staged PCI Diag - 2.0 mm x 12 mm Mini Vision BMS (2.25 mm)   cyst removed  in college   NEUROPLASTY / TRANSPOSITION MEDIAN NERVE AT CARPAL TUNNEL BILATERAL     NM MYOVIEW LTD  November 2013   EF 53%; fixed mid inferior-inferolateral defect, infarct with no ischemia.   right knee arthrsoscopy  40yrs ago   right trigger finger     seeds placed into prostate   2005   TEE WITHOUT CARDIOVERSION N/A 06/12/2015   Procedure: TRANSESOPHAGEAL ECHOCARDIOGRAM (TEE) - WITH CARDIOVERSION;  Surgeon: Sanda Klein, MD;  Location: [facility];  Service: Cardiovascular: EF 55-60%.No LA or LAA thrombus. Normal Peal PAP ~33 mmHg.   TOTAL KNEE ARTHROPLASTY  05/09/2012   Procedure: TOTAL KNEE ARTHROPLASTY;  Surgeon: Kerin Salen, MD;  Location: [facility];  Service: Orthopedics;  Laterality: Right;   TRANSTHORACIC ECHOCARDIOGRAM  06/2015   EF 60-65%. Mild LA dilation. Normal PA pressures: 32 mmHg. Aortic sclerosis but no stenosis.   wisdom teeth extraccted      Allergies  Allergen Reactions   Crab [Shellfish Allergy] Rash   Hydrocodone Hives   Adhesive [Tape] Rash   Oxycodone Rash    Allergies as of 12/10/2020       Reactions   Crab [shellfish Allergy] Rash   Hydrocodone Hives   Adhesive [tape] Rash   Oxycodone Rash        Medication List        Accurate as of December 10, 2020  3:14 PM. If you have any questions, ask your nurse or doctor.          acetaminophen 500 MG tablet Commonly known as: TYLENOL Take 1,000 mg by mouth. Three times a day as needed   atorvastatin 40 MG tablet Commonly known as: LIPITOR TAKE 1 TABLET DAILY AT BEDTIME   Biofreeze 4 % Gel Generic drug: Menthol (Topical Analgesic) Apply 1 application topically in the morning, at noon, in the evening, and at bedtime. Apply to right great toe   diltiazem 240 MG 24 hr capsule Commonly known as: DILACOR XR Take 240 mg by mouth daily. For heart failure   Eliquis 5 MG Tabs  tablet Generic drug: apixaban TAKE 1 TABLET TWICE A  DAY (SCHEDULE FOLLOW UP WITH CARDIOLOGIST PRIOR TO NEXT REFILL REQUEST)   esomeprazole 40 MG capsule Commonly known as: NEXIUM TAKE 1 CAPSULE DAILY   ferrous sulfate 325 (65 FE) MG tablet Take 325 mg by mouth daily with breakfast. Mon, Wed, Fri   furosemide 40 MG tablet Commonly known as: LASIX Take 40 mg by mouth daily.   losartan 50 MG tablet Commonly known as: COZAAR Take 50 mg by mouth daily.   magnesium oxide 400 MG tablet Commonly known as: MAG-OX Take 200 mg by mouth daily. 1/2 tablet   mirabegron ER 25 MG Tb24 tablet Commonly known as: MYRBETRIQ Take 25 mg by mouth daily.   MIRALAX PO Take 17 g by mouth daily.   Nitrostat 0.4 MG SL tablet Generic drug: nitroGLYCERIN USE AS NEEDED   nystatin cream Commonly known as: MYCOSTATIN Apply 1 application topically 2 (two) times daily as needed for dry skin. Apply to corners of mouth rash   PreviDent 5000 Booster Plus 1.1 % Pste Generic drug: Sodium Fluoride Place 1 application onto teeth at bedtime.   tamsulosin 0.4 MG Caps capsule Commonly known as: FLOMAX Take 0.4 mg by mouth daily.   triamcinolone cream 0.1 % Commonly known as: KENALOG Apply 1 application topically 2 (two) times daily as needed. Apply to corners of mouth rash   Vitamin D3 25 MCG (1000 UT) Caps Take 1 capsule by mouth daily.        Review of Systems  Constitutional:  Negative for activity change, appetite change and fever.  HENT:  Positive for hearing loss. Negative for congestion and voice change.   Eyes:  Negative for visual disturbance.       A stye right lower eye lid.   Respiratory:  Positive for shortness of breath. Negative for cough.        O2 dependent. DOE  Cardiovascular:  Positive for leg swelling.  Gastrointestinal:  Negative for abdominal pain and constipation.       Incontinent of feces.   Genitourinary:  Positive for frequency. Negative for dysuria and urgency.        Incontinent of urine.   Musculoskeletal:  Positive for arthralgias and gait problem.       Left knee pain is chronic. The right great toe pain at the base of the toe nail, but no warmth, redness, or swelling.   Skin:  Negative for color change.  Neurological:  Negative for speech difficulty, weakness and light-headedness.       Memory lapses.   Psychiatric/Behavioral:  Negative for behavioral problems and sleep disturbance. The patient is not nervous/anxious.    Immunization History  Administered Date(s) Administered   H1N1 04/13/2010   Influenza, High Dose Seasonal PF 02/12/2014, 03/29/2018, 01/05/2019   Influenza-Unspecified 03/06/2013, 02/12/2014, 03/06/2017   Moderna Sars-Covid-2 Vaccination 06/08/2019, 07/06/2019, 04/14/2020   Pneumococcal Conjugate-13 02/12/2014   Pneumococcal Polysaccharide-23 02/04/2013, 02/12/2014   Td 09/04/2005   Tdap 12/11/2012   Zoster, Live 07/07/2006   Pertinent  Health Maintenance Due  Topic Date Due   INFLUENZA VACCINE  01/04/2021   PNA vac Low Risk Adult  Completed   No flowsheet data found. Functional Status Survey:    Vitals:   12/10/20 1124  BP: 108/89  Pulse: 72  Resp: 16  Temp: 98.1 F (36.7 C)  SpO2: 95%  Weight: 217 lb 9.6 oz (98.7 kg)  Height: 5\' 6"  (1.676 m)   Body mass index is 35.12 kg/m. Physical Exam Vitals and nursing note reviewed.  Constitutional:  Appearance: Normal appearance.  HENT:     Head: Normocephalic and atraumatic.     Mouth/Throat:     Mouth: Mucous membranes are moist.  Eyes:     Extraocular Movements: Extraocular movements intact.     Conjunctiva/sclera: Conjunctivae normal.     Pupils: Pupils are equal, round, and reactive to light.     Comments: Mid of the right lower eyelid stye  Cardiovascular:     Rate and Rhythm: Normal rate. Rhythm irregular.     Heart sounds: No murmur heard.    Comments: DP pulses not felt R, right toe pain is chronic.  DP pulse felt L Pulmonary:     Breath  sounds: No wheezing.     Comments: O2 dependent.  Abdominal:     General: Bowel sounds are normal.     Palpations: Abdomen is soft.     Tenderness: There is no abdominal tenderness.  Musculoskeletal:     Cervical back: Normal range of motion and neck supple.     Right lower leg: Edema present.     Left lower leg: Edema present.     Comments: Trace edema BLE., less than prior, near none.  Mechanical lift for transfer.   Skin:    General: Skin is warm and dry.  Neurological:     General: No focal deficit present.     Mental Status: He is alert and oriented to person, place, and time. Mental status is at baseline.     Motor: No weakness.     Coordination: Coordination normal.     Gait: Gait abnormal.  Psychiatric:        Mood and Affect: Mood normal.        Behavior: Behavior normal.        Thought Content: Thought content normal.        Judgment: Judgment normal.    Labs reviewed: Recent Labs    04/01/20 0000 06/18/20 0000 07/23/20 0000  NA 140 139  --   K 4.0 4.0  --   CL 100 101  --   CO2 32* 29*  --   BUN 19 20  --   CREATININE 0.9 1.0  --   CALCIUM 9.2 8.9  --   MG  --   --  1.6   Recent Labs    04/01/20 0000 06/18/20 0000  AST 10* 10*  ALT 4* 4*  ALKPHOS 72 65  ALBUMIN 3.5 3.6   Recent Labs    02/18/20 0000 04/01/20 0000 06/18/20 0000  WBC 8.9 8.6 9.5  NEUTROABS  --   --  6,755.00  HGB 14.0 14.0 13.4*  HCT 40* 39* 39*  PLT 217 189 234   Lab Results  Component Value Date   TSH 2.58 10/02/2019   No results found for: HGBA1C Lab Results  Component Value Date   CHOL 129 06/18/2020   HDL 39 06/18/2020   LDLCALC 73 06/18/2020   TRIG 86 06/18/2020   CHOLHDL 2.9 02/12/2014    Significant Diagnostic Results in last 30 days:  No results found.  Assessment/Plan Hordeolum externum of right lower eyelid Continue warm compress, baby shampoo cleans eyelids/lashes, will apply 0.5% Erythromycin ophthalmic ointment 1cm ribbon into the right lower  conjunctival sac nightly x 5 days.   Urinary incontinence Urinary incontinent, no urinary retention, on Mirabegron 25mg  qd, Tamsulosin 0.4mg  qd.   Constipation stable, on MiraLax qd.   Osteoarthritis of right knee uses mechanical lift for transfer, takes prn Tylenol.  ILD (  interstitial lung disease) (HCC) Hx of Interstitial lung disease, O2 dependent    Diastolic CHF (HCC) CHF/minimal edema BLE, on Furosamide 40mg  qd. Bun/creat 20/0.95 11/10/20  Anxiety Stable, off meds.   Persistent atrial fibrillation ([place]):  CHA2DS2-VASc Score 4; On Eliquis heart rate is in control, on Diltiazem 240mg  qd, Eliquis 5mg  bid.  GERD (gastroesophageal reflux disease) stable, on Esomeprazole 40mg  qd.  Iron deficiency anemia stable, reduce  Fe 2x/wk,  Hgb 14.2 11/10/20  Essential hypertension blood pressure is controlled on Losartan 50mg  qd, Diltiazem 240mg  qd, Furosemide 40mg  qd. Bun/creat 20/0.95 11/10/20  OSA (obstructive sleep apnea) Continue CPAP    Family/ staff Communication: plan of care reviewed with the patient and charge nurse.   Labs/tests ordered:  CBC/diff one months.   Time spend 35 minutes.

## 2020-12-10 NOTE — Assessment & Plan Note (Signed)
Continue CPAP.  

## 2020-12-10 NOTE — Assessment & Plan Note (Signed)
Continue warm compress, baby shampoo cleans eyelids/lashes, will apply 0.5% Erythromycin ophthalmic ointment 1cm ribbon into the right lower conjunctival sac nightly x 5 days.

## 2020-12-10 NOTE — Assessment & Plan Note (Signed)
heart rate is in control, on Diltiazem 240mg  qd, Eliquis 5mg  bid.

## 2020-12-10 NOTE — Assessment & Plan Note (Signed)
Hx of Interstitial lung disease, O2 dependent

## 2020-12-10 NOTE — Assessment & Plan Note (Signed)
Urinary incontinent, no urinary retention, on Mirabegron 25mg  qd, Tamsulosin 0.4mg  qd.

## 2020-12-10 NOTE — Assessment & Plan Note (Signed)
stable, reduce  Fe 2x/wk,  Hgb 14.2 11/10/20

## 2020-12-10 NOTE — Assessment & Plan Note (Signed)
blood pressure is controlled on Losartan 50mg  qd, Diltiazem 240mg  qd, Furosemide 40mg  qd. Bun/creat 20/0.95 11/10/20

## 2020-12-10 NOTE — Assessment & Plan Note (Signed)
uses mechanical lift for transfer, takes prn Tylenol. 

## 2020-12-10 NOTE — Assessment & Plan Note (Signed)
Stable, off meds.  

## 2020-12-10 NOTE — Assessment & Plan Note (Addendum)
CHF/minimal edema BLE, on Furosamide 40mg  qd. Bun/creat 20/0.95 11/10/20

## 2020-12-10 NOTE — Assessment & Plan Note (Signed)
stable, on Esomeprazole 40mg  qd.

## 2020-12-10 NOTE — Assessment & Plan Note (Signed)
stable, on MiraLax qd. 

## 2020-12-18 ENCOUNTER — Non-Acute Institutional Stay (SKILLED_NURSING_FACILITY): Payer: Medicare Other | Admitting: Nurse Practitioner

## 2020-12-18 ENCOUNTER — Encounter: Payer: Self-pay | Admitting: Nurse Practitioner

## 2020-12-18 DIAGNOSIS — K5900 Constipation, unspecified: Secondary | ICD-10-CM

## 2020-12-18 DIAGNOSIS — I4819 Other persistent atrial fibrillation: Secondary | ICD-10-CM

## 2020-12-18 DIAGNOSIS — F419 Anxiety disorder, unspecified: Secondary | ICD-10-CM

## 2020-12-18 DIAGNOSIS — M1711 Unilateral primary osteoarthritis, right knee: Secondary | ICD-10-CM

## 2020-12-18 DIAGNOSIS — J849 Interstitial pulmonary disease, unspecified: Secondary | ICD-10-CM

## 2020-12-18 DIAGNOSIS — I5030 Unspecified diastolic (congestive) heart failure: Secondary | ICD-10-CM | POA: Diagnosis not present

## 2020-12-18 DIAGNOSIS — K219 Gastro-esophageal reflux disease without esophagitis: Secondary | ICD-10-CM

## 2020-12-18 DIAGNOSIS — G4733 Obstructive sleep apnea (adult) (pediatric): Secondary | ICD-10-CM

## 2020-12-18 DIAGNOSIS — D509 Iron deficiency anemia, unspecified: Secondary | ICD-10-CM

## 2020-12-18 DIAGNOSIS — I10 Essential (primary) hypertension: Secondary | ICD-10-CM

## 2020-12-18 NOTE — Assessment & Plan Note (Signed)
CHF/minimal edema BLE, on Furosamide 40mg  qd. Bun/creat 20/0.95 11/10/20

## 2020-12-18 NOTE — Assessment & Plan Note (Signed)
stable, on Esomeprazole 40mg  qd.

## 2020-12-18 NOTE — Assessment & Plan Note (Signed)
stable, on MiraLax qd. 

## 2020-12-18 NOTE — Assessment & Plan Note (Signed)
Anxiety, stable. Off alprazolam.

## 2020-12-18 NOTE — Assessment & Plan Note (Signed)
Uses CPAP 

## 2020-12-18 NOTE — Assessment & Plan Note (Signed)
uses mechanical lift for transfer, takes prn Tylenol. 

## 2020-12-18 NOTE — Assessment & Plan Note (Signed)
heart rate is in control, on Diltiazem 240mg  qd, Eliquis 5mg  bid.

## 2020-12-18 NOTE — Assessment & Plan Note (Signed)
stable, on Fe. Hgb 14.2 11/10/20

## 2020-12-18 NOTE — Assessment & Plan Note (Signed)
Bp, runs low, will decrease  Losartan 25mg  qd, continue Diltiazem 240mg  qd, Furosemide 40mg  qd. Bun/creat 20/0.95 11/10/20

## 2020-12-18 NOTE — Assessment & Plan Note (Signed)
Hx of Interstitial lung disease, O2 dependent

## 2020-12-18 NOTE — Progress Notes (Signed)
Location:   [place] Room Number: [phone] Place of Service:  SNF (31) Provider:  Janesha Brissette Otho Darner, NP    Patient Care Team: Virgie Dad, MD as PCP - General (Internal Medicine) Leonie Leelan Rajewski, MD as PCP - Cardiology (Cardiology)  Extended Emergency Contact Information Primary Emergency Contact: [facility] Address: [address]          [place], [place] [address] [name] of [place] Phone: [phone] Mobile Phone: [phone] Relation: Spouse Secondary Emergency Contact: Janey Genta Address: [address]          [place], [place] Phone: [phone] Mobile Phone: [phone] Relation: Daughter  Code Status:  DNR Goals of care: Advanced Directive information Advanced Directives 12/18/2020  Does Patient Have a Medical Advance Directive? Yes  Type of Paramedic of [place];Living will  Does patient want to make changes to medical advance directive? No - Patient declined  Copy of [place] in Chart? Yes - validated most recent copy scanned in chart (See row information)  Would patient like information on creating a medical advance directive? -  Pre-existing out of facility DNR order (yellow form or pink MOST form) -     Chief Complaint  Patient presents with   Medical Management of Chronic Issues    Routine follow up   Health Maintenance    Discuss need for shingles vaccine    HPI:  Pt is a 87 y.o. male seen today for medical management of chronic diseases.    The right lower eye lid sty, resolving.              Urinary incontinent, no urinary retention, on Mirabegron 25mg  qd, Tamsulosin 0.4mg  qd.              Constipation, stable, on MiraLax qd.              OA, uses mechanical lift for transfer, takes prn Tylenol.             Hx of Interstitial lung disease, O2 dependent                 CHF/minimal edema BLE, on Furosamide 40mg  qd. Bun/creat 20/0.95  11/10/20             Anxiety, stable. Off alprazolam.              Afib, heart rate is in control, on Diltiazem 240mg  qd, Eliquis 5mg  bid.             GERD, stable, on Esomeprazole 40mg  qd.             Anemia, stable, on Fe. Hgb 14.2 11/10/20             HTN, on Losartan 50mg  qd, Diltiazem 240mg  qd, Furosemide 40mg  qd. Bun/creat 20/0.95 11/10/20             OSA uses CPAP   Past Medical History:  Diagnosis Date   Anxiety    CAD S/P percutaneous coronary angioplasty 1996, 2009, 01/2010   PTCA of L Cx 1996; BMS-RCA [facility]) 2009; Staged PCI RCA (70% ISR) --> 3.0 mm x 23 mm BMS, staged PCI Diag - 2.0 mm x 12 mm Mini Vision BMS (2.25 mm)   Cataracts, bilateral    immature   Chronic back pain    scoliosis--pt states unable to have surgery bc of age   GERD (gastroesophageal reflux disease)    takes  Nexium daily   History of colon polyps    Hyperlipidemia    Hypertension    Myocardial infarction ([place]) 1996, 2009   x 2;last time was about 8-98yrs ago    Obesity (BMI 30.0-34.9)    OSA (obstructive sleep apnea)    Osteoarthritis of both knees    Peripheral edema    Venous Stasis   Persistent atrial fibrillation ([place])    Unable to maintaine NSR after DCCV with Tikosyn.  Plan = rate control w/diltiazem (beta blocker off b/c fatigue). CHA2DS2Vasc 4. DOAC - Eliquis.    Prostate cancer ([place]) 2005   seed implant   Past Surgical History:  Procedure Laterality Date   CARDIOVERSION N/A 06/12/2015   Procedure: CARDIOVERSION;  Surgeon: Sanda Klein, MD;  Location: [facility];  Service: Cardiovascular;  Laterality: N/A;   CARDIOVERSION N/A 08/02/2015   Procedure: CARDIOVERSION;  Surgeon: [name], MD;  Location: [facility];  Service: Cardiovascular;  Laterality: N/A;   [place], 2009, August 2011   PTCA of L Cx 1996; BMS-RCA [facility]) 2009; Staged PCI RCA (70% ISR) --> 3.0 mm x 23 mm BMS, staged PCI Diag - 2.0 mm x 12 mm Mini  Vision BMS (2.25 mm)   cyst removed  in college   NEUROPLASTY / TRANSPOSITION MEDIAN NERVE AT CARPAL TUNNEL BILATERAL     NM MYOVIEW LTD  November 2013   EF 53%; fixed mid inferior-inferolateral defect, infarct with no ischemia.   right knee arthrsoscopy  82yrs ago   right trigger finger     seeds placed into prostate   2005   TEE WITHOUT CARDIOVERSION N/A 06/12/2015   Procedure: TRANSESOPHAGEAL ECHOCARDIOGRAM (TEE) - WITH CARDIOVERSION;  Surgeon: Sanda Klein, MD;  Location: [facility];  Service: Cardiovascular: EF 55-60%.No LA or LAA thrombus. Normal Peal PAP ~33 mmHg.   TOTAL KNEE ARTHROPLASTY  05/09/2012   Procedure: TOTAL KNEE ARTHROPLASTY;  Surgeon: Kerin Salen, MD;  Location: [place];  Service: Orthopedics;  Laterality: Right;   TRANSTHORACIC ECHOCARDIOGRAM  06/2015   EF 60-65%. Mild LA dilation. Normal PA pressures: 32 mmHg. Aortic sclerosis but no stenosis.   wisdom teeth extraccted      Allergies  Allergen Reactions   Crab [Shellfish Allergy] Rash   Hydrocodone Hives   Adhesive [Tape] Rash   Oxycodone Rash    Allergies as of 12/18/2020       Reactions   Crab [shellfish Allergy] Rash   Hydrocodone Hives   Adhesive [tape] Rash   Oxycodone Rash        Medication List        Accurate as of December 18, 2020 11:59 PM. If you have any questions, ask your nurse or doctor.          acetaminophen 500 MG tablet Commonly known as: TYLENOL Take 1,000 mg by mouth. Three times a day as needed   atorvastatin 40 MG tablet Commonly known as: LIPITOR TAKE 1 TABLET DAILY AT BEDTIME   Biofreeze 4 % Gel Generic drug: Menthol (Topical Analgesic) Apply 1 application topically in the morning, at noon, in the evening, and at bedtime. Apply to right great toe   diltiazem 240 MG 24 hr capsule Commonly known as: DILACOR XR Take 240 mg by mouth daily. For heart failure   Eliquis 5 MG Tabs tablet Generic drug: apixaban TAKE 1 TABLET TWICE A DAY (SCHEDULE FOLLOW UP WITH  CARDIOLOGIST PRIOR TO NEXT  REFILL REQUEST)   esomeprazole 40 MG capsule Commonly known as: NEXIUM TAKE 1 CAPSULE DAILY   ferrous sulfate 325 (65 FE) MG tablet Take 325 mg by mouth daily with breakfast. Mon, Wed, Fri   furosemide 40 MG tablet Commonly known as: LASIX Take 40 mg by mouth daily.   losartan 50 MG tablet Commonly known as: COZAAR Take 50 mg by mouth daily.   magnesium oxide 400 MG tablet Commonly known as: MAG-OX Take 200 mg by mouth daily. 1/2 tablet   mirabegron ER 25 MG Tb24 tablet Commonly known as: MYRBETRIQ Take 25 mg by mouth daily.   MIRALAX PO Take 17 g by mouth daily.   Nitrostat 0.4 MG SL tablet Generic drug: nitroGLYCERIN USE AS NEEDED   nystatin cream Commonly known as: MYCOSTATIN Apply 1 application topically 2 (two) times daily as needed for dry skin. Apply to corners of mouth rash   PreviDent 5000 Booster Plus 1.1 % Pste Generic drug: Sodium Fluoride Place 1 application onto teeth at bedtime.   tamsulosin 0.4 MG Caps capsule Commonly known as: FLOMAX Take 0.4 mg by mouth daily.   triamcinolone cream 0.1 % Commonly known as: KENALOG Apply 1 application topically 2 (two) times daily as needed. Apply to corners of mouth rash   Vitamin D3 25 MCG (1000 UT) Caps Take 1 capsule by mouth daily.        Review of Systems  Constitutional:  Negative for fatigue, fever and unexpected weight change.  HENT:  Positive for hearing loss. Negative for congestion and voice change.   Eyes:  Negative for visual disturbance.       A stye right lower eye lid-resolving.   Respiratory:  Positive for shortness of breath. Negative for cough.        O2 dependent. DOE  Cardiovascular:  Positive for leg swelling.  Gastrointestinal:  Negative for abdominal pain and constipation.       Incontinent of feces.   Genitourinary:  Positive for frequency. Negative for dysuria and urgency.       Incontinent of urine.   Musculoskeletal:  Positive for arthralgias  and gait problem.       Left knee pain is chronic. The right great toe pain at the base of the toe nail, but no warmth, redness, or swelling.   Skin:  Negative for color change.  Neurological:  Negative for speech difficulty, weakness and light-headedness.       Memory lapses.   Psychiatric/Behavioral:  Negative for behavioral problems and sleep disturbance. The patient is not nervous/anxious.    Immunization History  Administered Date(s) Administered   H1N1 04/13/2010   Influenza, High Dose Seasonal PF 02/12/2014, 03/29/2018, 01/05/2019   Influenza-Unspecified 03/06/2013, 02/12/2014, 03/06/2017   Moderna Sars-Covid-2 Vaccination 06/08/2019, 07/06/2019, 04/14/2020   Pneumococcal Conjugate-13 02/12/2014   Pneumococcal Polysaccharide-23 02/04/2013, 02/12/2014   Td 09/04/2005   Tdap 12/11/2012   Zoster, Live 07/07/2006   Pertinent  Health Maintenance Due  Topic Date Due   INFLUENZA VACCINE  01/04/2021   PNA vac Low Risk Adult  Completed   No flowsheet data found. Functional Status Survey:    Vitals:   12/18/20 0926  BP: (!) 101/56  Pulse: 61  Resp: 16  Temp: 98 F (36.7 C)  SpO2: 97%  Weight: 216 lb (98 kg)  Height: 5\' 6"  (1.676 m)   Body mass index is 34.86 kg/m. Physical Exam Vitals and nursing note reviewed.  Constitutional:      Appearance: Normal appearance.  HENT:  Head: Normocephalic and atraumatic.     Mouth/Throat:     Mouth: Mucous membranes are moist.  Eyes:     Extraocular Movements: Extraocular movements intact.     Conjunctiva/sclera: Conjunctivae normal.     Pupils: Pupils are equal, round, and reactive to light.     Comments: Mid of the right lower eyelid stye-healing.   Cardiovascular:     Rate and Rhythm: Normal rate. Rhythm irregular.     Heart sounds: No murmur heard.    Comments: DP pulses not felt R, right toe pain is chronic.  DP pulse felt L Pulmonary:     Breath sounds: No wheezing.     Comments: O2 dependent.  Abdominal:      General: Bowel sounds are normal.     Palpations: Abdomen is soft.     Tenderness: There is no abdominal tenderness.  Musculoskeletal:     Cervical back: Normal range of motion and neck supple.     Right lower leg: Edema present.     Left lower leg: Edema present.     Comments: Trace edema BLE., less than prior, near none.  Mechanical lift for transfer.   Skin:    General: Skin is warm and dry.  Neurological:     General: No focal deficit present.     Mental Status: He is alert and oriented to person, place, and time. Mental status is at baseline.     Motor: No weakness.     Coordination: Coordination normal.     Gait: Gait abnormal.  Psychiatric:        Mood and Affect: Mood normal.        Behavior: Behavior normal.        Thought Content: Thought content normal.        Judgment: Judgment normal.    Labs reviewed: Recent Labs    04/01/20 0000 06/18/20 0000 07/23/20 0000 11/11/20 0000  NA 140 139  --  140  K 4.0 4.0  --  4.0  CL 100 101  --  101  CO2 32* 29*  --  27*  BUN 19 20  --  20  CREATININE 0.9 1.0  --  1.0  CALCIUM 9.2 8.9  --  8.9  MG  --   --  1.6  --    Recent Labs    04/01/20 0000 06/18/20 0000 11/11/20 0000  AST 10* 10* 9*  ALT 4* 4* 4*  ALKPHOS 72 65 63  ALBUMIN 3.5 3.6 3.5   Recent Labs    04/01/20 0000 06/18/20 0000 11/11/20 0000  WBC 8.6 9.5 7.3  NEUTROABS  --  6,755.00 4,358.00  HGB 14.0 13.4* 14.2  HCT 39* 39* 42  PLT 189 234 205   Lab Results  Component Value Date   TSH 3.33 11/11/2020   No results found for: HGBA1C Lab Results  Component Value Date   CHOL 116 11/11/2020   HDL 37 11/11/2020   LDLCALC 63 11/11/2020   TRIG 84 11/11/2020   CHOLHDL 2.9 02/12/2014    Significant Diagnostic Results in last 30 days:  No results found.  Assessment/Plan Constipation stable, on MiraLax qd.   Osteoarthritis of right knee uses mechanical lift for transfer, takes prn Tylenol.  ILD (interstitial lung disease) (HCC) Hx of  Interstitial lung disease, O2 dependent    Diastolic CHF (HCC) CHF/minimal edema BLE, on Furosamide 40mg  qd. Bun/creat 20/0.95 11/10/20  Anxiety Anxiety, stable. Off alprazolam.   Persistent atrial fibrillation ([place]):  CHA2DS2-VASc  Score 4; On Eliquis heart rate is in control, on Diltiazem 240mg  qd, Eliquis 5mg  bid.  OSA (obstructive sleep apnea) Uses CPAP  GERD (gastroesophageal reflux disease) stable, on Esomeprazole 40mg  qd.  Iron deficiency anemia stable, on Fe. Hgb 14.2 11/10/20  Essential hypertension Bp, runs low, will decrease  Losartan 25mg  qd, continue Diltiazem 240mg  qd, Furosemide 40mg  qd. Bun/creat 20/0.95 11/10/20    Family/ staff Communication: plan of care reviewed with the patient and charge nurse.   Labs/tests ordered:  none  Time spend 35 minutes.

## 2021-01-12 ENCOUNTER — Encounter: Payer: Self-pay | Admitting: Internal Medicine

## 2021-01-12 LAB — CBC AND DIFFERENTIAL
HCT: 41 (ref 41–53)
Hemoglobin: 14.3 (ref 13.5–17.5)
Neutrophils Absolute: 6408
Platelets: 205 (ref 150–399)
WBC: 9

## 2021-01-12 LAB — CBC: RBC: 4.25 (ref 3.87–5.11)

## 2021-01-12 NOTE — Progress Notes (Signed)
This encounter was created in error - please disregard.

## 2021-01-26 ENCOUNTER — Non-Acute Institutional Stay (SKILLED_NURSING_FACILITY): Payer: Medicare Other | Admitting: Internal Medicine

## 2021-01-26 ENCOUNTER — Encounter: Payer: Self-pay | Admitting: Internal Medicine

## 2021-01-26 DIAGNOSIS — F419 Anxiety disorder, unspecified: Secondary | ICD-10-CM | POA: Diagnosis not present

## 2021-01-26 DIAGNOSIS — F339 Major depressive disorder, recurrent, unspecified: Secondary | ICD-10-CM

## 2021-01-26 DIAGNOSIS — I5030 Unspecified diastolic (congestive) heart failure: Secondary | ICD-10-CM

## 2021-01-26 DIAGNOSIS — I10 Essential (primary) hypertension: Secondary | ICD-10-CM

## 2021-01-26 DIAGNOSIS — D509 Iron deficiency anemia, unspecified: Secondary | ICD-10-CM

## 2021-01-26 DIAGNOSIS — I2510 Atherosclerotic heart disease of native coronary artery without angina pectoris: Secondary | ICD-10-CM

## 2021-01-26 DIAGNOSIS — J849 Interstitial pulmonary disease, unspecified: Secondary | ICD-10-CM | POA: Diagnosis not present

## 2021-01-26 DIAGNOSIS — G4733 Obstructive sleep apnea (adult) (pediatric): Secondary | ICD-10-CM

## 2021-01-26 DIAGNOSIS — K219 Gastro-esophageal reflux disease without esophagitis: Secondary | ICD-10-CM

## 2021-01-26 DIAGNOSIS — I4819 Other persistent atrial fibrillation: Secondary | ICD-10-CM

## 2021-01-26 DIAGNOSIS — Z9861 Coronary angioplasty status: Secondary | ICD-10-CM

## 2021-01-26 DIAGNOSIS — R32 Unspecified urinary incontinence: Secondary | ICD-10-CM

## 2021-01-26 DIAGNOSIS — E785 Hyperlipidemia, unspecified: Secondary | ICD-10-CM

## 2021-01-26 NOTE — Progress Notes (Signed)
Location:   [place] Room Number: 5 Place of Service:  SNF [phone]) Provider:  Veleta Miners MD  Virgie Dad, MD  Patient Care Team: Virgie Dad, MD as PCP - General (Internal Medicine) Leonie Man, MD as PCP - Cardiology (Cardiology)  Extended Emergency Contact Information Primary Emergency Contact: [facility] Address: [address]          [place], [place] [address] [name] of [place] Phone: [phone] Mobile Phone: [phone] Relation: Spouse Secondary Emergency Contact: Janey Genta Address: [address]          [place], [place] Phone: [phone] Mobile Phone: [phone] Relation: Daughter  Code Status:  Full Code Managed Care Goals of care: Advanced Directive information Advanced Directives 01/26/2021  Does Patient Have a Medical Advance Directive? Yes  Type of Paramedic of [place];Living will  Does patient want to make changes to medical advance directive? No - Patient declined  Copy of [place] in Chart? Yes - validated most recent copy scanned in chart (See row information)  Would patient like information on creating a medical advance directive? -  Pre-existing out of facility DNR order (yellow form or pink MOST form) -     Chief Complaint  Patient presents with   Medical Management of Chronic Issues   Quality Metric Gaps    Shingrix, Flu vaccine    HPI:  Pt is a 87 y.o. male seen today for medical management of chronic diseases.    Patient has a history of CAD s/p PTCA, chronic atrial fibrillation on Eliquis,  chronic diastolic CHF,Interstitial lung disease with hypoxia on chronic oxygen,  OSA on CPAP doesn't use it  chronic back pain with spinal stenosis, S C-spine kyphosis,  cognitive impairment H/O Rosacea  Seen in his room. He has gained more weight  Stays in his room all the time Today was c/o Feeling  anxious and Depressed No SOB or Cough   Past Medical History:  Diagnosis Date   Anxiety    CAD S/P percutaneous coronary angioplasty 1996, 2009, 01/2010   PTCA of L Cx 1996; BMS-RCA [facility]) 2009; Staged PCI RCA (70% ISR) --> 3.0 mm x 23 mm BMS, staged PCI Diag - 2.0 mm x 12 mm Mini Vision BMS (2.25 mm)   Cataracts, bilateral    immature   Chronic back pain    scoliosis--pt states unable to have surgery bc of age   GERD (gastroesophageal reflux disease)    takes Nexium daily   History of colon polyps    Hyperlipidemia    Hypertension    Myocardial infarction ([place]) 1996, 2009   x 2;last time was about 8-69yr ago    Obesity (BMI 30.0-34.9)    OSA (obstructive sleep apnea)    Osteoarthritis of both knees    Peripheral edema    Venous Stasis   Persistent atrial fibrillation ([place]    Unable to maintaine NSR after DCCV with Tikosyn.  Plan = rate control w/diltiazem (beta blocker off b/c fatigue). CHA2DS2Vasc 4. DOAC - Eliquis.    Prostate cancer ([place] 2005   seed implant   Past Surgical History:  Procedure Laterality Date   CARDIOVERSION N/A 06/12/2015   Procedure: CARDIOVERSION;  Surgeon: MSanda Klein MD;  [facility];  Service: Cardiovascular;  Laterality: N/A;   CARDIOVERSION N/A 08/02/2015   Procedure: CARDIOVERSION;  Surgeon: JThompson Grayer MD;  [place] [facility]  Service: Cardiovascular;  Laterality: N/A;   CHOLECYSTECTOMY     COLONOSCOPY     CORONARY ANGIOPLASTY WITH STENT PLACEMENT  1996, 2009, August 2011   PTCA of L Cx 1996; BMS-RCA [facility]) 2009; Staged PCI RCA (70% ISR) --> 3.0 mm x 23 mm BMS, staged PCI Diag - 2.0 mm x 12 mm Mini Vision BMS (2.25 mm)   cyst removed  in college   NEUROPLASTY / TRANSPOSITION MEDIAN NERVE AT CARPAL TUNNEL BILATERAL     NM MYOVIEW LTD  November 2013   EF 53%; fixed mid inferior-inferolateral defect, infarct with no ischemia.   right knee arthrsoscopy  37yr ago   right trigger finger     seeds placed into prostate   2005   TEE  WITHOUT CARDIOVERSION N/A 06/12/2015   Procedure: TRANSESOPHAGEAL ECHOCARDIOGRAM (TEE) - WITH CARDIOVERSION;  Surgeon: MSanda Klein MD;  [place] [facility]  Service: Cardiovascular: EF 55-60%.No LA or LAA thrombus. Normal Peal PAP ~33 mmHg.   TOTAL KNEE ARTHROPLASTY  05/09/2012   Procedure: TOTAL KNEE ARTHROPLASTY;  Surgeon: FKerin Salen MD;  [place] [facility]  Service: Orthopedics;  Laterality: Right;   TRANSTHORACIC ECHOCARDIOGRAM  06/2015   EF 60-65%. Mild LA dilation. Normal PA pressures: 32 mmHg. Aortic sclerosis but no stenosis.   wisdom teeth extraccted      Allergies  Allergen Reactions   Crab [Shellfish Allergy] Rash   Hydrocodone Hives   Adhesive [Tape] Rash   Oxycodone Rash    Allergies as of 01/26/2021       Reactions   Crab [shellfish Allergy] Rash   Hydrocodone Hives   Adhesive [tape] Rash   Oxycodone Rash        Medication List        Accurate as of January 26, 2021 10:01 AM. If you have any questions, ask your nurse or doctor.          acetaminophen 500 MG tablet Commonly known as: TYLENOL Take 1,000 mg by mouth. Three times a day as needed   ALPRAZolam 0.5 MG tablet Commonly known as: XANAX Take 0.5 mg by mouth every 6 (six) hours as needed for anxiety.   atorvastatin 40 MG tablet Commonly known as: LIPITOR TAKE 1 TABLET DAILY AT BEDTIME   Biofreeze 4 % Gel Generic drug: Menthol (Topical Analgesic) Apply 1 application topically in the morning, at noon, in the evening, and at bedtime. Apply to right great toe   diltiazem 240 MG 24 hr capsule Commonly known as: DILACOR XR Take 240 mg by mouth daily. For heart failure   Eliquis 5 MG Tabs tablet Generic drug: apixaban TAKE 1 TABLET TWICE A DAY (SCHEDULE FOLLOW UP WITH CARDIOLOGIST PRIOR TO NEXT REFILL REQUEST)   esomeprazole 40 MG capsule Commonly known as: NEXIUM TAKE 1 CAPSULE DAILY   ferrous sulfate 325 (65 FE) MG tablet Take 325 mg by mouth daily with breakfast. Mon, Wed, Fri    furosemide 40 MG tablet Commonly known as: LASIX Take 40 mg by mouth daily.   losartan 50 MG tablet Commonly known as: COZAAR Take 25 mg by mouth daily.   magnesium oxide 400 MG tablet Commonly known as: MAG-OX Take 200 mg by mouth daily. 1/2 tablet   mirabegron ER 25 MG Tb24 tablet Commonly known as: MYRBETRIQ Take 25 mg by mouth daily.   MIRALAX PO Take 17 g by mouth daily.   Nitrostat 0.4 MG SL tablet Generic drug: nitroGLYCERIN USE AS NEEDED   nystatin cream Commonly known as: MYCOSTATIN Apply 1 application  topically 2 (two) times daily as needed for dry skin. Apply to corners of mouth rash   PreviDent 5000 Booster Plus 1.1 % Pste Generic drug: Sodium Fluoride Place 1 application onto teeth at bedtime.   tamsulosin 0.4 MG Caps capsule Commonly known as: FLOMAX Take 0.4 mg by mouth daily.   triamcinolone cream 0.1 % Commonly known as: KENALOG Apply 1 application topically 2 (two) times daily as needed. Apply to corners of mouth rash   Vitamin D3 25 MCG (1000 UT) Caps Take 1 capsule by mouth daily.        Review of Systems  Constitutional:  Positive for activity change and unexpected weight change.  HENT: Negative.    Respiratory: Negative.    Cardiovascular:  Positive for leg swelling.  Gastrointestinal: Negative.   Genitourinary: Negative.   Musculoskeletal:  Positive for back pain and gait problem.  Skin: Negative.   Neurological:  Negative for dizziness.  Psychiatric/Behavioral:  Positive for dysphoric mood. The patient is nervous/anxious.    Immunization History  Administered Date(s) Administered   H1N1 04/13/2010   Influenza, High Dose Seasonal PF 02/12/2014, 03/29/2018, 01/05/2019   Influenza-Unspecified 03/06/2013, 02/12/2014, 03/06/2017   Moderna SARS-COV2 Booster Vaccination 11/03/2020   Moderna Sars-Covid-2 Vaccination 06/08/2019, 07/06/2019, 04/14/2020   Pneumococcal Conjugate-13 02/12/2014   Pneumococcal Polysaccharide-23 02/04/2013,  02/12/2014   Td 09/04/2005   Tdap 12/11/2012   Zoster, Live 07/07/2006   Pertinent  Health Maintenance Due  Topic Date Due   INFLUENZA VACCINE  01/04/2021   PNA vac Low Risk Adult  Completed   No flowsheet data found. Functional Status Survey:    Vitals:   01/26/21 0943  BP: 135/81  Pulse: 76  Resp: (!) 22  Temp: 98 F (36.7 C)  SpO2: 95%  Weight: 216 lb (98 kg)  Height: '5\' 7"'$  (1.702 m)   Body mass index is 33.83 kg/m. Physical Exam Vitals reviewed.  Constitutional:      Appearance: He is obese.  HENT:     Head: Normocephalic.     Nose: Nose normal.     Mouth/Throat:     Mouth: Mucous membranes are moist.     Pharynx: Oropharynx is clear.  Eyes:     Pupils: Pupils are equal, round, and reactive to light.  Cardiovascular:     Rate and Rhythm: Normal rate and regular rhythm.  Pulmonary:     Effort: Pulmonary effort is normal. No respiratory distress.     Breath sounds: Normal breath sounds.  Abdominal:     General: Abdomen is flat. Bowel sounds are normal.     Palpations: Abdomen is soft.  Musculoskeletal:     Cervical back: Neck supple.     Comments: Mild Swelling Bilateral  Neurological:     General: No focal deficit present.     Mental Status: He is alert and oriented to person, place, and time.  Psychiatric:     Comments: Seems Anxious and Depressed    Labs reviewed: Recent Labs    04/01/20 0000 06/18/20 0000 07/23/20 0000 11/11/20 0000  NA 140 139  --  140  K 4.0 4.0  --  4.0  CL 100 101  --  101  CO2 32* 29*  --  27*  BUN 19 20  --  20  CREATININE 0.9 1.0  --  1.0  CALCIUM 9.2 8.9  --  8.9  MG  --   --  1.6  --    Recent Labs    04/01/20 0000 06/18/20  0000 11/11/20 0000  AST 10* 10* 9*  ALT 4* 4* 4*  ALKPHOS 72 65 63  ALBUMIN 3.5 3.6 3.5   Recent Labs    06/18/20 0000 11/11/20 0000 01/12/21 0000  WBC 9.5 7.3 9.0  NEUTROABS 6,755.00 4,358.00 6,408.00  HGB 13.4* 14.2 14.3  HCT 39* 42 41  PLT 234 205 205   Lab Results   Component Value Date   TSH 3.33 11/11/2020   No results found for: HGBA1C Lab Results  Component Value Date   CHOL 116 11/11/2020   HDL 37 11/11/2020   LDLCALC 63 11/11/2020   TRIG 84 11/11/2020   CHOLHDL 2.9 02/12/2014    Significant Diagnostic Results in last 30 days:  No results found.  Assessment/Plan Depression, recurrent ([place]) Will start him on Lexapro 10 mg  Anxiety Renew Xanax BID for another 2 weeks ILD (interstitial lung disease) ([place]) Chronic oxygen Does not follow with Pulmonary any more Diastolic congestive heart failure, unspecified HF chronicity (HCC) On lasix Creat stable  Persistent atrial fibrillation ([place]):  CHA2DS2-VASc Score 4; On Eliquis On Eliquis and Cardizem OSA (obstructive sleep apnea) Does not  use his CPAP Gastroesophageal reflux disease, unspecified whether esophagitis present On Nexium Urinary incontinence, unspecified type On Myrbetriq and Flomax Iron deficiency anemia, unspecified iron deficiency anemia type Discontinue iron as Hgb normal  Essential hypertension Cozaar change to 25 mg Also on Cardizem CAD S/P percutaneous coronary angioplasty On , statin, Hyperlipidemia with target LDL less than 70 LDL Less then 70 On statin  Family/ staff Communication:   Labs/tests ordered:

## 2021-02-17 ENCOUNTER — Non-Acute Institutional Stay (SKILLED_NURSING_FACILITY): Payer: Medicare Other | Admitting: Nurse Practitioner

## 2021-02-17 ENCOUNTER — Encounter: Payer: Self-pay | Admitting: Nurse Practitioner

## 2021-02-17 DIAGNOSIS — K219 Gastro-esophageal reflux disease without esophagitis: Secondary | ICD-10-CM

## 2021-02-17 DIAGNOSIS — J849 Interstitial pulmonary disease, unspecified: Secondary | ICD-10-CM | POA: Diagnosis not present

## 2021-02-17 DIAGNOSIS — I4819 Other persistent atrial fibrillation: Secondary | ICD-10-CM | POA: Diagnosis not present

## 2021-02-17 DIAGNOSIS — I10 Essential (primary) hypertension: Secondary | ICD-10-CM

## 2021-02-17 DIAGNOSIS — G4733 Obstructive sleep apnea (adult) (pediatric): Secondary | ICD-10-CM

## 2021-02-17 DIAGNOSIS — F419 Anxiety disorder, unspecified: Secondary | ICD-10-CM

## 2021-02-17 DIAGNOSIS — I5030 Unspecified diastolic (congestive) heart failure: Secondary | ICD-10-CM

## 2021-02-17 DIAGNOSIS — D509 Iron deficiency anemia, unspecified: Secondary | ICD-10-CM

## 2021-02-17 NOTE — Assessment & Plan Note (Addendum)
Anxiety/depression, better since Lexapro '10mg'$  qd, Xanax bid x2 weeks started 01/26/21(01/13/21 needs Alprazolam due to his wife's surgery)

## 2021-02-17 NOTE — Progress Notes (Signed)
Location:   [facility] Room Number: 5 Place of Service:  SNF (31) Provider: Marlana Latus NP    Patient Care Team: Virgie Dad, MD as PCP - General (Internal Medicine) Leonie Jaysin Gayler, MD as PCP - Cardiology (Cardiology)  Extended Emergency Contact Information Primary Emergency Contact: [facility] Address: [address]          [place], [place] [address] [name] of [place] Phone: [phone] Mobile Phone: [phone] Relation: Spouse Secondary Emergency Contact: Janey Genta Address: [address]          [place], [place] Phone: [phone] Mobile Phone: [phone] Relation: Daughter  Code Status:  DNR Goals of care: Advanced Directive information Advanced Directives 02/17/2021  Does Patient Have a Medical Advance Directive? Yes  Type of Paramedic of [place];Living will  Does patient want to make changes to medical advance directive? No - Patient declined  Copy of [place] in Chart? Yes - validated most recent copy scanned in chart (See row information)  Would patient like information on creating a medical advance directive? -  Pre-existing out of facility DNR order (yellow form or pink MOST form) -     Chief Complaint  Patient presents with   Medical Management of Chronic Issues    Routine Visit.    Immunizations    Discuss the need for Shingrix vaccine, and Influenza vaccine.      HPI:  Pt is a 87 y.o. male seen today for medical management of chronic diseases.     The right lower eye lid sty, resolving.              Urinary incontinent, no urinary retention, on Mirabegron '25mg'$  qd, Tamsulosin 0.'4mg'$  qd.              Constipation, stable, on MiraLax qd.              OA, uses mechanical lift for transfer, takes prn Tylenol.             Hx of Interstitial lung disease, O2 dependent                 CHF/minimal edema BLE, on Furosamide '40mg'$  qd.  Bun/creat 20/0.95 11/10/20             Anxiety/depression, better since Lexapro '10mg'$  qd, Xanax bid x2 weeks started 01/26/21             Afib, heart rate is in control, on Diltiazem '240mg'$  qd, Eliquis '5mg'$  bid. TSH 3.33 11/11/20             GERD, stable, on Esomeprazole '40mg'$  qd.             Anemia, stable, off Fe. Hgb 14.3 01/12/21             HTN, on Losartan, Diltiazem, Furosemide '40mg'$  qd. Bun/creat 20/0.95 11/10/20             OSA uses CPAP       Past Medical History:  Diagnosis Date   Anxiety    CAD S/P percutaneous coronary angioplasty 1996, 2009, 01/2010   PTCA of L Cx 1996; BMS-RCA [facility]) 2009; Staged PCI RCA (70% ISR) --> 3.0 mm x 23 mm BMS, staged PCI Diag - 2.0 mm x 12 mm Mini Vision BMS (2.25 mm)   Cataracts, bilateral    immature   Chronic back pain    scoliosis--pt states unable to have  surgery bc of age   GERD (gastroesophageal reflux disease)    takes Nexium daily   History of colon polyps    Hyperlipidemia    Hypertension    Myocardial infarction ([place]) 1996, 2009   x 2;last time was about 8-57yr ago    Obesity (BMI 30.0-34.9)    OSA (obstructive sleep apnea)    Osteoarthritis of both knees    Peripheral edema    Venous Stasis   Persistent atrial fibrillation ([place]    Unable to maintaine NSR after DCCV with Tikosyn.  Plan = rate control w/diltiazem (beta blocker off b/c fatigue). CHA2DS2Vasc 4. DOAC - Eliquis.    Prostate cancer ([place] 2005   seed implant   Past Surgical History:  Procedure Laterality Date   CARDIOVERSION N/A 06/12/2015   Procedure: CARDIOVERSION;  Surgeon: MSanda Klein MD;  [facility];  Service: Cardiovascular;  Laterality: N/A;   CARDIOVERSION N/A 08/02/2015   Procedure: CARDIOVERSION;  Surgeon: JThompson Grayer MD;  [place] [facility]  Service: Cardiovascular;  Laterality: N/A;   CPage 2009, August 2011   PTCA of L Cx 1996; BMS-RCA ([facility] 2009; Staged PCI RCA  (70% ISR) --> 3.0 mm x 23 mm BMS, staged PCI Diag - 2.0 mm x 12 mm Mini Vision BMS (2.25 mm)   cyst removed  in college   NEUROPLASTY / TRANSPOSITION MEDIAN NERVE AT CARPAL TUNNEL BILATERAL     [facility] MYOVIEW [facility]  November 2013   EF 53%; fixed mid inferior-inferolateral defect, infarct with no ischemia.   right knee arthrsoscopy  [address]yr[address]   right trigger finger     seeds placed into prostate   2005   TEE WITHOUT CARDIOVERSION N/A 06/12/2015   Procedure: TRANSESOPHAGEAL ECHOCARDIOGRAM (TEE) - WITH CARDIOVERSION;  Surgeon: MiSanda KleinMD;  [place] [facility] Service: Cardiovascular: EF 55-60%.No LA or LAA thrombus. Normal Peal PAP ~33 mmHg.   TOTAL KNEE ARTHROPLASTY  05/09/2012   Procedure: TOTAL KNEE ARTHROPLASTY;  Surgeon: FrKerin SalenMD;  [place] [facility] Service: Orthopedics;  Laterality: Right;   TRANSTHORACIC ECHOCARDIOGRAM  06/2015   EF 60-65%. Mild LA dilation. Normal PA pressures: 32 mmHg. Aortic sclerosis but no stenosis.   wisdom teeth extraccted      Allergies  Allergen Reactions   Crab [Shellfish Allergy] Rash   Hydrocodone Hives   Adhesive [Tape] Rash   Oxycodone Rash    Allergies as of 02/17/2021       Reactions   Crab [shellfish Allergy] Rash   Hydrocodone Hives   Adhesive [tape] Rash   Oxycodone Rash        Medication List        Accurate as of February 17, 2021  3:38 PM. If you have any questions, ask your nurse or doctor.          STOP taking these medications    ALPRAZolam 0.5 MG 24 hr tablet Commonly known as: XANAX XR Stopped by: Isis Costanza X Shriyans Kuenzi, NP   nystatin cream Commonly known as: MYCOSTATIN Stopped by: Daquisha Clermont X Lashea Goda, NP   triamcinolone cream 0.1 % Commonly known as: KENALOG Stopped by: Chance Munter X Salina Stanfield, NP       TAKE these medications    acetaminophen 500 MG tablet Commonly known as: TYLENOL Take 1,000 mg by mouth. Three times a day as needed   atorvastatin 40 MG tablet Commonly known as: LIPITOR TAKE 1 TABLET  DAILY AT  BEDTIME   Biofreeze 4 % Gel Generic drug: Menthol (Topical Analgesic) Apply 1 application topically in the morning, at noon, in the evening, and at bedtime. Apply to right great toe   diltiazem 240 MG 24 hr capsule Commonly known as: DILACOR XR Take 240 mg by mouth daily. For heart failure   Eliquis 5 MG Tabs tablet Generic drug: apixaban TAKE 1 TABLET TWICE A DAY (SCHEDULE FOLLOW UP WITH CARDIOLOGIST PRIOR TO NEXT REFILL REQUEST)   escitalopram 10 MG tablet Commonly known as: LEXAPRO Take 10 mg by mouth daily.   esomeprazole 40 MG capsule Commonly known as: NEXIUM TAKE 1 CAPSULE DAILY   furosemide 40 MG tablet Commonly known as: LASIX Take 40 mg by mouth daily.   losartan 50 MG tablet Commonly known as: COZAAR Take 25 mg by mouth daily.   magnesium oxide 400 MG tablet Commonly known as: MAG-OX Take 200 mg by mouth daily. 1/2 tablet   mirabegron ER 25 MG Tb24 tablet Commonly known as: MYRBETRIQ Take 25 mg by mouth daily.   MIRALAX PO Take 17 g by mouth daily.   Nitrostat 0.4 MG SL tablet Generic drug: nitroGLYCERIN USE AS NEEDED   PreviDent 5000 Booster Plus 1.1 % Pste Generic drug: Sodium Fluoride Place 1 application onto teeth at bedtime.   tamsulosin 0.4 MG Caps capsule Commonly known as: FLOMAX Take 0.4 mg by mouth daily.   Vitamin D3 25 MCG (1000 UT) Caps Take 1 capsule by mouth daily.        Review of Systems  Constitutional:  Negative for activity change, fever and unexpected weight change.  HENT:  Positive for hearing loss. Negative for congestion and trouble swallowing.   Eyes:  Negative for visual disturbance.  Respiratory:  Positive for shortness of breath. Negative for cough.        O2 dependent. DOE  Cardiovascular:  Positive for leg swelling.  Gastrointestinal:  Negative for abdominal pain and constipation.       Incontinent of feces.   Genitourinary:  Positive for frequency. Negative for dysuria and urgency.       Incontinent of  urine.   Musculoskeletal:  Positive for arthralgias and gait problem.       Left knee pain is chronic. The right great toe pain at the base of the toe nail, but no warmth, redness, or swelling.   Skin:  Negative for color change.  Neurological:  Negative for speech difficulty, weakness and light-headedness.       Memory lapses.   Psychiatric/Behavioral:  Negative for behavioral problems and sleep disturbance. The patient is nervous/anxious.    Immunization History  Administered Date(s) Administered   H1N1 04/13/2010   Influenza, High Dose Seasonal PF 02/12/2014, 03/29/2018, 01/05/2019   Influenza-Unspecified 03/06/2013, 02/12/2014, 03/06/2017   Moderna SARS-COV2 Booster Vaccination 11/03/2020   Moderna Sars-Covid-2 Vaccination 06/08/2019, 07/06/2019, 04/14/2020   Pneumococcal Conjugate-13 02/12/2014   Pneumococcal Polysaccharide-23 02/04/2013, 02/12/2014   Td 09/04/2005   Tdap 12/11/2012   Zoster, Live 07/07/2006   Pertinent  Health Maintenance Due  Topic Date Due   INFLUENZA VACCINE  01/04/2021   PNA vac Low Risk Adult  Completed   No flowsheet data found. Functional Status Survey:    Vitals:   02/17/21 1352  BP: 132/88  Pulse: 76  Resp: 19  Temp: 97.6 F (36.4 C)  SpO2: 98%  Weight: 216 lb 9.6 oz (98.2 kg)  Height: '5\' 7"'$  (1.702 m)   Body mass index is 33.92 kg/m. Physical  Exam Vitals and nursing note reviewed.  Constitutional:      Appearance: Normal appearance.  HENT:     Head: Normocephalic and atraumatic.     Mouth/Throat:     Mouth: Mucous membranes are moist.  Eyes:     Extraocular Movements: Extraocular movements intact.     Conjunctiva/sclera: Conjunctivae normal.     Pupils: Pupils are equal, round, and reactive to light.  Cardiovascular:     Rate and Rhythm: Normal rate. Rhythm irregular.     Heart sounds: No murmur heard.    Comments: DP pulses not felt R, right toe pain is chronic.  DP pulse felt L Pulmonary:     Breath sounds: No wheezing.      Comments: O2 dependent.  Abdominal:     General: Bowel sounds are normal.     Palpations: Abdomen is soft.     Tenderness: There is no abdominal tenderness.  Musculoskeletal:     Cervical back: Normal range of motion and neck supple.     Right lower leg: Edema present.     Left lower leg: Edema present.     Comments: Trace edema BLE., less than prior, near none.  Mechanical lift for transfer.   Skin:    General: Skin is warm and dry.  Neurological:     General: No focal deficit present.     Mental Status: He is alert and oriented to person, place, and time. Mental status is at baseline.     Motor: No weakness.     Coordination: Coordination normal.     Gait: Gait abnormal.  Psychiatric:        Mood and Affect: Mood normal.        Behavior: Behavior normal.        Thought Content: Thought content normal.        Judgment: Judgment normal.    Labs reviewed: Recent Labs    04/01/20 0000 06/18/20 0000 07/23/20 0000 11/11/20 0000  NA 140 139  --  140  K 4.0 4.0  --  4.0  CL 100 101  --  101  CO2 32* 29*  --  27*  BUN 19 20  --  20  CREATININE 0.9 1.0  --  1.0  CALCIUM 9.2 8.9  --  8.9  MG  --   --  1.6  --    Recent Labs    04/01/20 0000 06/18/20 0000 11/11/20 0000  AST 10* 10* 9*  ALT 4* 4* 4*  ALKPHOS 72 65 63  ALBUMIN 3.5 3.6 3.5   Recent Labs    06/18/20 0000 11/11/20 0000 01/12/21 0000  WBC 9.5 7.3 9.0  NEUTROABS 6,755.00 4,358.00 6,408.00  HGB 13.4* 14.2 14.3  HCT 39* 42 41  PLT 234 205 205   Lab Results  Component Value Date   TSH 3.33 11/11/2020   No results found for: HGBA1C Lab Results  Component Value Date   CHOL 116 11/11/2020   HDL 37 11/11/2020   LDLCALC 63 11/11/2020   TRIG 84 11/11/2020   CHOLHDL 2.9 02/12/2014    Significant Diagnostic Results in last 30 days:  No results found.  Assessment/Plan  ILD (interstitial lung disease) (HCC) Hx of Interstitial lung disease, O2 dependent    Diastolic CHF (HCC) CHF/minimal edema  BLE, on Furosamide '40mg'$  qd. Bun/creat 20/0.95 11/10/20  Anxiety Anxiety/depression, better since Lexapro '10mg'$  qd, Xanax bid x2 weeks started 01/26/21(01/13/21 needs Alprazolam due to his wife's surgery)  Persistent atrial fibrillation ([place]):  CHA2DS2-VASc Score 4; On Eliquis heart rate is in control, on Diltiazem '240mg'$  qd, Eliquis '5mg'$  bid, TSH 3.33 11/11/20  GERD (gastroesophageal reflux disease) stable, on Esomeprazole '40mg'$  qd.  Iron deficiency anemia  Anemia, stable, off Fe. Hgb 14.3 01/12/21  Essential hypertension on Losartan, Diltiazem, Furosemide '40mg'$  qd. Bun/creat 20/0.95 11/10/20  OSA (obstructive sleep apnea) uses CPAP   Family/ staff Communication: plan of care reviewed with the patient and charge nurse.   Labs/tests ordered:  none  Time spend 35 minutes.

## 2021-02-17 NOTE — Assessment & Plan Note (Signed)
on Losartan, Diltiazem, Furosemide 40mg  qd. Bun/creat 20/0.95 11/10/20

## 2021-02-17 NOTE — Assessment & Plan Note (Signed)
uses CPAP 

## 2021-02-17 NOTE — Assessment & Plan Note (Signed)
Anemia, stable, off Fe. Hgb 14.3 01/12/21

## 2021-02-17 NOTE — Assessment & Plan Note (Signed)
CHF/minimal edema BLE, on Furosamide '40mg'$  qd. Bun/creat 20/0.95 11/10/20

## 2021-02-17 NOTE — Assessment & Plan Note (Signed)
Hx of Interstitial lung disease, O2 dependent

## 2021-02-17 NOTE — Assessment & Plan Note (Addendum)
heart rate is in control, on Diltiazem '240mg'$  qd, Eliquis '5mg'$  bid, TSH 3.33 11/11/20

## 2021-02-17 NOTE — Assessment & Plan Note (Signed)
stable, on Esomeprazole 40mg  qd.

## 2021-02-26 ENCOUNTER — Other Ambulatory Visit: Payer: Self-pay | Admitting: Orthopedic Surgery

## 2021-02-26 ENCOUNTER — Encounter: Payer: Self-pay | Admitting: Orthopedic Surgery

## 2021-02-26 ENCOUNTER — Non-Acute Institutional Stay (SKILLED_NURSING_FACILITY): Payer: Medicare Other | Admitting: Orthopedic Surgery

## 2021-02-26 DIAGNOSIS — F339 Major depressive disorder, recurrent, unspecified: Secondary | ICD-10-CM | POA: Diagnosis not present

## 2021-02-26 DIAGNOSIS — I5030 Unspecified diastolic (congestive) heart failure: Secondary | ICD-10-CM

## 2021-02-26 DIAGNOSIS — K219 Gastro-esophageal reflux disease without esophagitis: Secondary | ICD-10-CM

## 2021-02-26 DIAGNOSIS — G4733 Obstructive sleep apnea (adult) (pediatric): Secondary | ICD-10-CM

## 2021-02-26 DIAGNOSIS — F419 Anxiety disorder, unspecified: Secondary | ICD-10-CM

## 2021-02-26 DIAGNOSIS — J849 Interstitial pulmonary disease, unspecified: Secondary | ICD-10-CM

## 2021-02-26 DIAGNOSIS — R32 Unspecified urinary incontinence: Secondary | ICD-10-CM

## 2021-02-26 DIAGNOSIS — I10 Essential (primary) hypertension: Secondary | ICD-10-CM

## 2021-02-26 DIAGNOSIS — I4819 Other persistent atrial fibrillation: Secondary | ICD-10-CM

## 2021-02-26 MED ORDER — ALPRAZOLAM 0.5 MG PO TABS: 0.5000 mg | ORAL_TABLET | Freq: Two times a day (BID) | ORAL | 0 refills | Status: AC | PRN

## 2021-02-26 NOTE — Progress Notes (Signed)
Location:   [facility] Room Number: [place] of Service:  SNF [phone]) Provider:  Windell Moulding, NP    Patient Care Team: Virgie Dad, MD as PCP - General (Internal Medicine) Leonie Man, MD as PCP - Cardiology (Cardiology)  Extended Emergency Contact Information Primary Emergency Contact: [facility] Address: [address]          [place], [place] [address] [name] of [place] Phone: [phone] Mobile Phone: [phone] Relation: Spouse Secondary Emergency Contact: Janey Genta Address: [address]          [place], [place] Phone: [phone] Mobile Phone: [phone] Relation: Daughter  Code Status:  DNR Goals of care: Advanced Directive information Advanced Directives 02/26/2021  Does Patient Have a Medical Advance Directive? Yes  Type of Paramedic of [place];Living will;Out of facility DNR (pink MOST or yellow form)  Does patient want to make changes to medical advance directive? No - Patient declined  Copy of [place] in Chart? -  Would patient like information on creating a medical advance directive? -  Pre-existing out of facility DNR order (yellow form or pink MOST form) -     Chief Complaint  Patient presents with   Acute Visit    Anxiety    HPI:  Pt is a 87 y.o. male seen today for an acute visit for anxiety.   His son voiced concerns about increased anxiety and not acting himself to nurse this morning. 08/23 he was started on Lexapro for depression and anxiety. He was also given xanax 0.5 mg po bid prn. Today, he reports increased anxiety. Denies panic attacks. Upset he has limited social interaction and just watches television most of the day. 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 He is unsure if Lexapro has helped.   ILD- remains on oxygen, not always compliant with wearing O2- staff has to remind him to wear, not  followed by pulmonary at this time OSA- continues to use CPAP qhs CHF- no recent weight fluctuations, sob, mild ankle edema, lasix daily Afib- rate controlled with Cardizem, remains on Eliquis for clot prevention HTN- BUN/creat 20/1.0 11/2020, losartan daily GERD- hgb 14.3 01/12/2021, denies increased reflux, Nexium daily Urinary incontinence- denies retention, stable with Mrybetriq and Flomax  No recent falls, injuries or behavioral outbursts.   Recent blood pressures:  09/19- 126/86  09/12- 132/88  09/05- 128/72      Past Medical History:  Diagnosis Date   Anxiety    CAD S/P percutaneous coronary angioplasty 1996, 2009, 01/2010   PTCA of L Cx 1996; BMS-RCA [facility]) 2009; Staged PCI RCA (70% ISR) --> 3.0 mm x 23 mm BMS, staged PCI Diag - 2.0 mm x 12 mm Mini Vision BMS (2.25 mm)   Cataracts, bilateral    immature   Chronic back pain    scoliosis--pt states unable to have surgery bc of age   GERD (gastroesophageal reflux disease)    takes Nexium daily   History of colon polyps    Hyperlipidemia    Hypertension    Myocardial infarction ([place]) 1996, 2009   x 2;last time was about 8-22yrs ago    Obesity (BMI 30.0-34.9)    OSA (obstructive sleep apnea)    Osteoarthritis of both knees    Peripheral edema    Venous Stasis   Persistent atrial fibrillation ([place])    Unable to maintaine NSR after DCCV with Tikosyn.  Plan = rate control w/diltiazem (beta blocker off b/c fatigue). CHA2DS2Vasc 4. DOAC - Eliquis.    Prostate cancer ([place]) 2005   seed implant   Past Surgical History:  Procedure Laterality Date   CARDIOVERSION N/A 06/12/2015   Procedure: CARDIOVERSION;  Surgeon: Sanda Klein, MD;  Location: [facility];  Service: Cardiovascular;  Laterality: N/A;   CARDIOVERSION N/A 08/02/2015   Procedure: CARDIOVERSION;  Surgeon: [name], MD;  Location: [facility];  Service: Cardiovascular;  Laterality: N/A;   [place], 2009, August 2011   PTCA of L Cx 1996; BMS-RCA [facility]) 2009; Staged PCI RCA (70% ISR) --> 3.0 mm x 23 mm BMS, staged PCI Diag - 2.0 mm x 12 mm Mini Vision BMS (2.25 mm)   cyst removed  in college   NEUROPLASTY / TRANSPOSITION MEDIAN NERVE AT CARPAL TUNNEL BILATERAL     NM MYOVIEW LTD  November 2013   EF 53%; fixed mid inferior-inferolateral defect, infarct with no ischemia.   right knee arthrsoscopy  59yrs ago   right trigger finger     seeds placed into prostate   2005   TEE WITHOUT CARDIOVERSION N/A 06/12/2015   Procedure: TRANSESOPHAGEAL ECHOCARDIOGRAM (TEE) - WITH CARDIOVERSION;  Surgeon: Sanda Klein, MD;  Location: [facility];  Service: Cardiovascular: EF 55-60%.No LA or LAA thrombus. Normal Peal PAP ~33 mmHg.   TOTAL KNEE ARTHROPLASTY  05/09/2012   Procedure: TOTAL KNEE ARTHROPLASTY;  Surgeon: Kerin Salen, MD;  Location: [facility];  Service: Orthopedics;  Laterality: Right;   TRANSTHORACIC ECHOCARDIOGRAM  06/2015   EF 60-65%. Mild LA dilation. Normal PA pressures: 32 mmHg. Aortic sclerosis but no stenosis.   wisdom teeth extraccted      Allergies  Allergen Reactions   Crab [Shellfish Allergy] Rash   Hydrocodone Hives   Adhesive [Tape] Rash   Oxycodone Rash    Allergies as of 02/26/2021       Reactions   Crab [shellfish Allergy] Rash   Hydrocodone Hives   Adhesive [tape] Rash   Oxycodone Rash        Medication List        Accurate as of February 26, 2021  3:27 PM. If you have any questions, ask your nurse or doctor.          acetaminophen 500 MG tablet Commonly known as: TYLENOL Take 1,000 mg by mouth. Three times a day as needed   ALPRAZolam 0.5 MG tablet Commonly known as: XANAX Take 1 tablet (0.5 mg total) by mouth 2 (two) times daily as needed for up to 14 days for anxiety. Started by: Yvonna Alanis, NP   atorvastatin 40 MG tablet Commonly known as: LIPITOR TAKE 1 TABLET DAILY AT BEDTIME   Biofreeze 4 % Gel Generic drug: Menthol  (Topical Analgesic) Apply 1 application topically in the morning, at noon, in the evening, and at bedtime. Apply to right great toe   diltiazem 240 MG 24 hr capsule Commonly known as: DILACOR XR Take 240 mg by mouth daily. For heart failure   Eliquis 5 MG Tabs tablet Generic drug: apixaban TAKE 1 TABLET TWICE A DAY (SCHEDULE FOLLOW UP WITH CARDIOLOGIST PRIOR TO NEXT REFILL REQUEST)   escitalopram 10 MG tablet Commonly known as: LEXAPRO Take 10 mg by mouth daily.   escitalopram 10 MG tablet Commonly known as: LEXAPRO Take 10 mg by mouth daily.   esomeprazole 40 MG capsule Commonly known as:  NEXIUM TAKE 1 CAPSULE DAILY   furosemide 40 MG tablet Commonly known as: LASIX Take 40 mg by mouth daily.   losartan 25 MG tablet Commonly known as: COZAAR Take 25 mg by mouth daily. What changed: Another medication with the same name was removed. Continue taking this medication, and follow the directions you see here. Changed by: Yvonna Alanis, NP   magnesium oxide 400 MG tablet Commonly known as: MAG-OX Take 200 mg by mouth daily. 1/2 tablet   mirabegron ER 25 MG Tb24 tablet Commonly known as: MYRBETRIQ Take 25 mg by mouth daily.   MIRALAX PO Take 17 g by mouth daily.   Nitrostat 0.4 MG SL tablet Generic drug: nitroGLYCERIN USE AS NEEDED   PreviDent 5000 Booster Plus 1.1 % Pste Generic drug: Sodium Fluoride Place 1 application onto teeth at bedtime.   tamsulosin 0.4 MG Caps capsule Commonly known as: FLOMAX Take 0.4 mg by mouth daily.   Vitamin D3 25 MCG (1000 UT) Caps Take 1 capsule by mouth daily.        Review of Systems  Constitutional:  Negative for activity change, appetite change, chills, fatigue and fever.  HENT: Negative.    Eyes: Negative.   Respiratory:  Positive for shortness of breath. Negative for cough and wheezing.        Oxygen use  Cardiovascular:  Positive for leg swelling. Negative for chest pain.  Gastrointestinal:  Positive for  constipation. Negative for abdominal distention, abdominal pain, blood in stool, diarrhea and nausea.  Genitourinary:  Positive for frequency and urgency. Negative for dysuria and hematuria.       Incontinence  Musculoskeletal:  Positive for arthralgias, gait problem and myalgias.  Skin: Negative.   Neurological:  Positive for weakness. Negative for dizziness and headaches.  Psychiatric/Behavioral:  Positive for dysphoric mood. Negative for confusion and sleep disturbance. The patient is nervous/anxious.    Immunization History  Administered Date(s) Administered   H1N1 04/13/2010   Influenza, High Dose Seasonal PF 02/12/2014, 03/29/2018, 01/05/2019   Influenza-Unspecified 03/06/2013, 02/12/2014, 03/06/2017   Moderna SARS-COV2 Booster Vaccination 11/03/2020   Moderna Sars-Covid-2 Vaccination 06/08/2019, 07/06/2019, 04/14/2020   Pneumococcal Conjugate-13 02/12/2014   Pneumococcal Polysaccharide-23 02/04/2013, 02/12/2014   Td 09/04/2005   Tdap 12/11/2012   Zoster, Live 07/07/2006   Pertinent  Health Maintenance Due  Topic Date Due   INFLUENZA VACCINE  01/04/2021   No flowsheet data found. Functional Status Survey:    Vitals:   02/26/21 1504  BP: 126/86  Pulse: 78  Resp: 19  Temp: (!) 96.5 F (35.8 C)  SpO2: 96%  Weight: 216 lb 9.6 oz (98.2 kg)  Height: 5\' 7"  (1.702 m)   Body mass index is 33.92 kg/m. Physical Exam Vitals reviewed.  Constitutional:      General: He is not in acute distress. HENT:     Head: Normocephalic.  Eyes:     General:        Right eye: No discharge.        Left eye: No discharge.  Neck:     Vascular: No carotid bruit.  Cardiovascular:     Rate and Rhythm: Normal rate. Rhythm irregular.     Pulses: Normal pulses.     Heart sounds: Normal heart sounds. No murmur heard. Pulmonary:     Effort: Pulmonary effort is normal. No respiratory distress.     Breath sounds: Normal breath sounds. No wheezing.  Abdominal:     General: Bowel sounds are  normal. There is no  distension.     Palpations: Abdomen is soft.     Tenderness: There is no abdominal tenderness.  Musculoskeletal:     Cervical back: Normal range of motion.     Right lower leg: Edema present.     Left lower leg: Edema present.     Comments: Non-pitting  Lymphadenopathy:     Cervical: No cervical adenopathy.  Skin:    General: Skin is warm and dry.  Neurological:     General: No focal deficit present.     Mental Status: He is alert and oriented to person, place, and time.     Motor: Weakness present.     Gait: Gait abnormal.  Psychiatric:        Mood and Affect: Mood normal.        Behavior: Behavior normal.    Labs reviewed: Recent Labs    04/01/20 0000 06/18/20 0000 07/23/20 0000 11/11/20 0000  NA 140 139  --  140  K 4.0 4.0  --  4.0  CL 100 101  --  101  CO2 32* 29*  --  27*  BUN 19 20  --  20  CREATININE 0.9 1.0  --  1.0  CALCIUM 9.2 8.9  --  8.9  MG  --   --  1.6  --    Recent Labs    04/01/20 0000 06/18/20 0000 11/11/20 0000  AST 10* 10* 9*  ALT 4* 4* 4*  ALKPHOS 72 65 63  ALBUMIN 3.5 3.6 3.5   Recent Labs    06/18/20 0000 11/11/20 0000 01/12/21 0000  WBC 9.5 7.3 9.0  NEUTROABS 6,755.00 4,358.00 6,408.00  HGB 13.4* 14.2 14.3  HCT 39* 42 41  PLT 234 205 205   Lab Results  Component Value Date   TSH 3.33 11/11/2020   No results found for: HGBA1C Lab Results  Component Value Date   CHOL 116 11/11/2020   HDL 37 11/11/2020   LDLCALC 63 11/11/2020   TRIG 84 11/11/2020   CHOLHDL 2.9 02/12/2014    Significant Diagnostic Results in last 30 days:  No results found.  Assessment/Plan 1. Anxiety - lexapro 10 mg started 01/26/2021 - no recent panic attacks - restart xanax 0.5 mg po bid prn x 14 days- report usage to provider in 14 days - reevaluate next routine visit  2. Depression, recurrent ([place]) - see above - recommend continuing Lexapro 10 mg daily for a few more weeks to see if effective  3. ILD (interstitial lung  disease) ([place]) - cont chronic oxygen use  4. Diastolic congestive heart failure, unspecified HF chronicity ([facility]) - no weight fluctuations, some sob and ankle edema - cont lasix  5. Persistent atrial fibrillation ([place]):  CHA2DS2-VASc Score 4; On Eliquis - rate controlled with Cardizem - cont Eliquis for clot prevention  6. Essential hypertension - controlled - cont losartan  7. Gastroesophageal reflux disease, unspecified whether esophagitis present - hgb normal - cont Nexium   8. Urinary incontinence, unspecified type - stable with Mrybetriq and Flomax  9. OSA (obstructive sleep apnea) - cont CPAP qhs    Family/ staff Communication: plan discussed with patient and nurse  Labs/tests ordered:  none

## 2021-03-22 ENCOUNTER — Non-Acute Institutional Stay (SKILLED_NURSING_FACILITY): Payer: Medicare Other | Admitting: Nurse Practitioner

## 2021-03-22 ENCOUNTER — Encounter: Payer: Self-pay | Admitting: Nurse Practitioner

## 2021-03-22 DIAGNOSIS — J849 Interstitial pulmonary disease, unspecified: Secondary | ICD-10-CM | POA: Diagnosis not present

## 2021-03-22 DIAGNOSIS — M1711 Unilateral primary osteoarthritis, right knee: Secondary | ICD-10-CM

## 2021-03-22 DIAGNOSIS — K5900 Constipation, unspecified: Secondary | ICD-10-CM

## 2021-03-22 DIAGNOSIS — I5030 Unspecified diastolic (congestive) heart failure: Secondary | ICD-10-CM

## 2021-03-22 DIAGNOSIS — N3942 Incontinence without sensory awareness: Secondary | ICD-10-CM

## 2021-03-22 DIAGNOSIS — D509 Iron deficiency anemia, unspecified: Secondary | ICD-10-CM

## 2021-03-22 DIAGNOSIS — I10 Essential (primary) hypertension: Secondary | ICD-10-CM

## 2021-03-22 DIAGNOSIS — K219 Gastro-esophageal reflux disease without esophagitis: Secondary | ICD-10-CM

## 2021-03-22 DIAGNOSIS — I4819 Other persistent atrial fibrillation: Secondary | ICD-10-CM

## 2021-03-22 DIAGNOSIS — F419 Anxiety disorder, unspecified: Secondary | ICD-10-CM

## 2021-03-22 DIAGNOSIS — E785 Hyperlipidemia, unspecified: Secondary | ICD-10-CM

## 2021-03-22 DIAGNOSIS — G4733 Obstructive sleep apnea (adult) (pediatric): Secondary | ICD-10-CM

## 2021-03-22 NOTE — Assessment & Plan Note (Signed)
CHF/minimal edema BLE, on Furosamide. Bun/creat 20/0.95 11/10/20

## 2021-03-22 NOTE — Assessment & Plan Note (Signed)
stable, off Fe. Hgb 14.3 01/12/21 

## 2021-03-22 NOTE — Assessment & Plan Note (Signed)
uses mechanical lift for transfer, takes prn Tylenol. 

## 2021-03-22 NOTE — Assessment & Plan Note (Signed)
on Losartan, Diltiazem, Furosemide 40mg  qd. Bun/creat 20/0.95 11/10/20

## 2021-03-22 NOTE — Assessment & Plan Note (Signed)
uses CPAP 

## 2021-03-22 NOTE — Assessment & Plan Note (Signed)
Reduce Atorvastatin to 20mg  qd, LDL 63 11/11/20

## 2021-03-22 NOTE — Assessment & Plan Note (Signed)
stable, on MiraLax qd. 

## 2021-03-22 NOTE — Assessment & Plan Note (Signed)
Hx of Interstitial lung disease, O2 dependent

## 2021-03-22 NOTE — Assessment & Plan Note (Signed)
heart rate is in control, on Diltiazem 240mg  qd, Eliquis 5mg  bid. TSH 3.33 11/11/20

## 2021-03-22 NOTE — Assessment & Plan Note (Signed)
stable, on Lexapro.  

## 2021-03-22 NOTE — Assessment & Plan Note (Signed)
stable, on Esomeprazole 40mg  qd.

## 2021-03-22 NOTE — Progress Notes (Signed)
Location:   SNF [place] Room Number: 5 Place of Service:  SNF (31) Provider: [facility] Arlyce Circle NP  Virgie Dad, MD  Patient Care Team: Virgie Dad, MD as PCP - General (Internal Medicine) Leonie Janitza Revuelta, MD as PCP - Cardiology (Cardiology)  Extended Emergency Contact Information Primary Emergency Contact: [facility] Address: [address]          [place], [place] [address] [name] of [place] Phone: [phone] Mobile Phone: [phone] Relation: Spouse Secondary Emergency Contact: Janey Genta Address: [address]          [place], [place] Phone: [phone] Mobile Phone: [phone] Relation: Daughter  Code Status: DNR Goals of care: Advanced Directive information Advanced Directives 03/23/2021  Does Patient Have a Medical Advance Directive? Yes  Type of Paramedic of [place];Living will  Does patient want to make changes to medical advance directive? No - Patient declined  Copy of [place] in Chart? Yes - validated most recent copy scanned in chart (See row information)  Would patient like information on creating a medical advance directive? -  Pre-existing out of facility DNR order (yellow form or pink MOST form) -     Chief Complaint  Patient presents with   Acute Visit    Medication review to reduce pill burden.     HPI:  Pt is a 87 y.o. male seen today for an acute visit for medication review to reduce the patient's pill burden requested by HPOA.              Urinary incontinent, no urinary retention, on Mirabegron 25mg  qd, Tamsulosin 0.4mg  qd.              Constipation, stable, on MiraLax qd.              OA, uses mechanical lift for transfer, takes prn Tylenol.             Hx of Interstitial lung disease, O2 dependent                 CHF/minimal edema BLE, on Furosamide. Bun/creat 20/0.95 11/10/20             Anxiety/depression, stable, on Lexapro.               Afib, heart rate is in control, on Diltiazem 240mg  qd, Eliquis 5mg  bid. TSH 3.33 11/11/20             GERD, stable, on Esomeprazole 40mg  qd.             Anemia, stable, off Fe. Hgb 14.3 01/12/21             HTN, on Losartan, Diltiazem, Furosemide 40mg  qd. Bun/creat 20/0.95 11/10/20             OSA uses CPAP  Hyperlipidemia, takes Atorvastatin, LDL 63 11/11/20   Past Medical History:  Diagnosis Date   Anxiety    CAD S/P percutaneous coronary angioplasty 1996, 2009, 01/2010   PTCA of L Cx 1996; BMS-RCA [facility]) 2009; Staged PCI RCA (70% ISR) --> 3.0 mm x 23 mm BMS, staged PCI Diag - 2.0 mm x 12 mm Mini Vision BMS (2.25 mm)   Cataracts, bilateral    immature   Chronic back pain    scoliosis--pt states unable to have surgery bc of age   GERD (gastroesophageal reflux disease)    takes Nexium daily   History of colon polyps  Hyperlipidemia    Hypertension    Myocardial infarction ([place]) 1996, 2009   x 2;last time was about 8-35yrs ago    Obesity (BMI 30.0-34.9)    OSA (obstructive sleep apnea)    Osteoarthritis of both knees    Peripheral edema    Venous Stasis   Persistent atrial fibrillation ([place])    Unable to maintaine NSR after DCCV with Tikosyn.  Plan = rate control w/diltiazem (beta blocker off b/c fatigue). CHA2DS2Vasc 4. DOAC - Eliquis.    Prostate cancer ([place]) 2005   seed implant   Past Surgical History:  Procedure Laterality Date   CARDIOVERSION N/A 06/12/2015   Procedure: CARDIOVERSION;  Surgeon: Sanda Klein, MD;  Location: [facility];  Service: Cardiovascular;  Laterality: N/A;   CARDIOVERSION N/A 08/02/2015   Procedure: CARDIOVERSION;  Surgeon: [name], MD;  Location: [facility];  Service: Cardiovascular;  Laterality: N/A;   [place], 2009, August 2011   PTCA of L Cx 1996; BMS-RCA [facility]) 2009; Staged PCI RCA (70% ISR) --> 3.0 mm x 23 mm BMS, staged PCI Diag - 2.0 mm x 12 mm Mini Vision BMS  (2.25 mm)   cyst removed  in college   NEUROPLASTY / TRANSPOSITION MEDIAN NERVE AT CARPAL TUNNEL BILATERAL     NM MYOVIEW LTD  November 2013   EF 53%; fixed mid inferior-inferolateral defect, infarct with no ischemia.   right knee arthrsoscopy  42yrs ago   right trigger finger     seeds placed into prostate   2005   TEE WITHOUT CARDIOVERSION N/A 06/12/2015   Procedure: TRANSESOPHAGEAL ECHOCARDIOGRAM (TEE) - WITH CARDIOVERSION;  Surgeon: Sanda Klein, MD;  Location: [facility];  Service: Cardiovascular: EF 55-60%.No LA or LAA thrombus. Normal Peal PAP ~33 mmHg.   TOTAL KNEE ARTHROPLASTY  05/09/2012   Procedure: TOTAL KNEE ARTHROPLASTY;  Surgeon: Kerin Salen, MD;  Location: [facility];  Service: Orthopedics;  Laterality: Right;   TRANSTHORACIC ECHOCARDIOGRAM  06/2015   EF 60-65%. Mild LA dilation. Normal PA pressures: 32 mmHg. Aortic sclerosis but no stenosis.   wisdom teeth extraccted      Allergies  Allergen Reactions   Crab [Shellfish Allergy] Rash   Hydrocodone Hives   Adhesive [Tape] Rash   Oxycodone Rash    Allergies as of 03/22/2021       Reactions   Crab [shellfish Allergy] Rash   Hydrocodone Hives   Adhesive [tape] Rash   Oxycodone Rash        Medication List        Accurate as of March 22, 2021 11:59 PM. If you have any questions, ask your nurse or doctor.          acetaminophen 500 MG tablet Commonly known as: TYLENOL Take 1,000 mg by mouth. Three times a day as needed   atorvastatin 40 MG tablet Commonly known as: LIPITOR TAKE 1 TABLET DAILY AT BEDTIME   Biofreeze 4 % Gel Generic drug: Menthol (Topical Analgesic) Apply 1 application topically in the morning, at noon, in the evening, and at bedtime. Apply to right great toe   diltiazem 240 MG 24 hr capsule Commonly known as: DILACOR XR Take 240 mg by mouth daily. For heart failure   Eliquis 5 MG Tabs tablet Generic drug: apixaban TAKE 1 TABLET TWICE A DAY (SCHEDULE FOLLOW UP WITH CARDIOLOGIST  PRIOR TO NEXT REFILL REQUEST)   escitalopram 10 MG tablet Commonly known as:  LEXAPRO Take 10 mg by mouth daily.   escitalopram 10 MG tablet Commonly known as: LEXAPRO Take 10 mg by mouth daily.   esomeprazole 40 MG capsule Commonly known as: NEXIUM TAKE 1 CAPSULE DAILY   furosemide 40 MG tablet Commonly known as: LASIX Take 40 mg by mouth daily.   losartan 25 MG tablet Commonly known as: COZAAR Take 25 mg by mouth daily.   magnesium oxide 400 MG tablet Commonly known as: MAG-OX Take 200 mg by mouth daily. 1/2 tablet   mirabegron ER 25 MG Tb24 tablet Commonly known as: MYRBETRIQ Take 25 mg by mouth daily.   MIRALAX PO Take 17 g by mouth daily.   Nitrostat 0.4 MG SL tablet Generic drug: nitroGLYCERIN USE AS NEEDED   PreviDent 5000 Booster Plus 1.1 % Pste Generic drug: Sodium Fluoride Place 1 application onto teeth at bedtime.   tamsulosin 0.4 MG Caps capsule Commonly known as: FLOMAX Take 0.4 mg by mouth daily.   Vitamin D3 25 MCG (1000 UT) Caps Take 1 capsule by mouth daily.        Review of Systems  Constitutional:  Negative for activity change, fever and unexpected weight change.  HENT:  Positive for hearing loss. Negative for congestion and trouble swallowing.   Eyes:  Negative for visual disturbance.  Respiratory:  Positive for shortness of breath. Negative for cough.        O2 dependent. DOE  Cardiovascular:  Positive for leg swelling.  Gastrointestinal:  Negative for abdominal pain and constipation.       Incontinent of feces.   Genitourinary:  Positive for frequency. Negative for dysuria and urgency.       Incontinent of urine.   Musculoskeletal:  Positive for arthralgias and gait problem.       Left knee pain is chronic. The right great toe pain at the base of the toe nail, but no warmth, redness, or swelling.   Skin:  Negative for color change.  Neurological:  Negative for speech difficulty, weakness and light-headedness.       Memory lapses.    Psychiatric/Behavioral:  Negative for behavioral problems and sleep disturbance. The patient is not nervous/anxious.    Immunization History  Administered Date(s) Administered   H1N1 04/13/2010   Influenza, High Dose Seasonal PF 02/12/2014, 03/29/2018, 01/05/2019   Influenza-Unspecified 03/06/2013, 02/12/2014, 03/06/2017   Moderna SARS-COV2 Booster Vaccination 11/03/2020   Moderna Sars-Covid-2 Vaccination 06/08/2019, 07/06/2019, 04/14/2020   Pneumococcal Conjugate-13 02/12/2014   Pneumococcal Polysaccharide-23 02/04/2013, 02/12/2014   Td 09/04/2005   Tdap 12/11/2012   Zoster, Live 07/07/2006   Pertinent  Health Maintenance Due  Topic Date Due   INFLUENZA VACCINE  01/04/2021   No flowsheet data found. Functional Status Survey:    Vitals:   03/22/21 1600  BP: 127/85  Pulse: 73  Resp: 18  Temp: (!) 97.1 F (36.2 C)  SpO2: 92%   There is no height or weight on file to calculate BMI. Physical Exam Vitals and nursing note reviewed.  Constitutional:      Appearance: Normal appearance.  HENT:     Head: Normocephalic and atraumatic.     Mouth/Throat:     Mouth: Mucous membranes are moist.  Eyes:     Extraocular Movements: Extraocular movements intact.     Conjunctiva/sclera: Conjunctivae normal.     Pupils: Pupils are equal, round, and reactive to light.  Cardiovascular:     Rate and Rhythm: Normal rate. Rhythm irregular.     Heart sounds: No murmur  heard. Pulmonary:     Breath sounds: No wheezing.     Comments: O2 dependent.  Abdominal:     General: Bowel sounds are normal.     Palpations: Abdomen is soft.     Tenderness: There is no abdominal tenderness.  Musculoskeletal:     Cervical back: Normal range of motion and neck supple.     Right lower leg: Edema present.     Left lower leg: Edema present.     Comments: Trace edema BLE., less than prior, near none.  Mechanical lift for transfer.   Skin:    General: Skin is warm and dry.  Neurological:     General:  No focal deficit present.     Mental Status: He is alert and oriented to person, place, and time. Mental status is at baseline.     Gait: Gait abnormal.  Psychiatric:        Mood and Affect: Mood normal.        Behavior: Behavior normal.        Thought Content: Thought content normal.        Judgment: Judgment normal.    Labs reviewed: Recent Labs    04/01/20 0000 06/18/20 0000 07/23/20 0000 11/11/20 0000  NA 140 139  --  140  K 4.0 4.0  --  4.0  CL 100 101  --  101  CO2 32* 29*  --  27*  BUN 19 20  --  20  CREATININE 0.9 1.0  --  1.0  CALCIUM 9.2 8.9  --  8.9  MG  --   --  1.6  --    Recent Labs    04/01/20 0000 06/18/20 0000 11/11/20 0000  AST 10* 10* 9*  ALT 4* 4* 4*  ALKPHOS 72 65 63  ALBUMIN 3.5 3.6 3.5   Recent Labs    06/18/20 0000 11/11/20 0000 01/12/21 0000  WBC 9.5 7.3 9.0  NEUTROABS 6,755.00 4,358.00 6,408.00  HGB 13.4* 14.2 14.3  HCT 39* 42 41  PLT 234 205 205   Lab Results  Component Value Date   TSH 3.33 11/11/2020   No results found for: HGBA1C Lab Results  Component Value Date   CHOL 116 11/11/2020   HDL 37 11/11/2020   LDLCALC 63 11/11/2020   TRIG 84 11/11/2020   CHOLHDL 2.9 02/12/2014    Significant Diagnostic Results in last 30 days:  No results found.  Assessment/Plan: Osteoarthritis of right knee uses mechanical lift for transfer, takes prn Tylenol.  ILD (interstitial lung disease) (HCC)  Hx of Interstitial lung disease, O2 dependent      Diastolic CHF (HCC) CHF/minimal edema BLE, on Furosamide. Bun/creat 20/0.95 11/10/20  Anxiety stable, on Lexapro.   Persistent atrial fibrillation ([place]):  CHA2DS2-VASc Score 4; On Eliquis heart rate is in control, on Diltiazem 240mg  qd, Eliquis 5mg  bid. TSH 3.33 11/11/20  GERD (gastroesophageal reflux disease) stable, on Esomeprazole 40mg  qd.  Iron deficiency anemia stable, off Fe. Hgb 14.3 01/12/21  Essential hypertension on Losartan, Diltiazem, Furosemide 40mg  qd. Bun/creat  20/0.95 11/10/20  OSA (obstructive sleep apnea) uses CPAP  Hyperlipidemia with target LDL less than 70  Reduce Atorvastatin to 20mg  qd, LDL 63 11/11/20  Constipation stable, on MiraLax qd.   Urinary incontinence  no urinary retention, on Mirabegron 25mg  qd, Tamsulosin 0.4mg  qd.   Reduce pill burden: dc Magnesium Oxide/Vit D, change Tylenol to prn, reduce Atorvastatin to 20mg  qd if HPOA consents.   Family/ staff Communication: plan of care  reviewed with the patient and charge nurse.   Labs/tests ordered:  none  Time spend 25 minutes.

## 2021-03-22 NOTE — Assessment & Plan Note (Signed)
no urinary retention, on Mirabegron 25mg  qd, Tamsulosin 0.4mg  qd.

## 2021-03-23 ENCOUNTER — Non-Acute Institutional Stay (SKILLED_NURSING_FACILITY): Payer: Medicare Other | Admitting: Internal Medicine

## 2021-03-23 ENCOUNTER — Encounter: Payer: Self-pay | Admitting: Internal Medicine

## 2021-03-23 ENCOUNTER — Encounter: Payer: Self-pay | Admitting: Nurse Practitioner

## 2021-03-23 DIAGNOSIS — R32 Unspecified urinary incontinence: Secondary | ICD-10-CM

## 2021-03-23 DIAGNOSIS — I5030 Unspecified diastolic (congestive) heart failure: Secondary | ICD-10-CM

## 2021-03-23 DIAGNOSIS — G4733 Obstructive sleep apnea (adult) (pediatric): Secondary | ICD-10-CM

## 2021-03-23 DIAGNOSIS — J849 Interstitial pulmonary disease, unspecified: Secondary | ICD-10-CM | POA: Diagnosis not present

## 2021-03-23 DIAGNOSIS — E785 Hyperlipidemia, unspecified: Secondary | ICD-10-CM

## 2021-03-23 DIAGNOSIS — I4819 Other persistent atrial fibrillation: Secondary | ICD-10-CM | POA: Diagnosis not present

## 2021-03-23 DIAGNOSIS — F419 Anxiety disorder, unspecified: Secondary | ICD-10-CM

## 2021-03-23 DIAGNOSIS — I10 Essential (primary) hypertension: Secondary | ICD-10-CM

## 2021-03-23 DIAGNOSIS — D509 Iron deficiency anemia, unspecified: Secondary | ICD-10-CM

## 2021-03-23 NOTE — Progress Notes (Signed)
Location:   [place] Room Number: 5 Place of Service:  SNF [phone]) Provider:  Veleta Miners MD  Virgie Dad, MD  Patient Care Team: Virgie Dad, MD as PCP - General (Internal Medicine) Leonie Man, MD as PCP - Cardiology (Cardiology)  Extended Emergency Contact Information Primary Emergency Contact: [facility] Address: [address]          [place], [place] [address] [name] of [place] Phone: [phone] Mobile Phone: [phone] Relation: Spouse Secondary Emergency Contact: Janey Genta Address: [address]          [place], [place] of [place] Phone: [phone] Mobile Phone: [phone] Relation: Daughter  Code Status:  DNR Managed Care Goals of care: Advanced Directive information Advanced Directives 03/23/2021  Does Patient Have a Medical Advance Directive? Yes  Type of Paramedic of [place];Living will  Does patient want to make changes to medical advance directive? No - Patient declined  Copy of [place] in Chart? Yes - validated most recent copy scanned in chart (See row information)  Would patient like information on creating a medical advance directive? -  Pre-existing out of facility DNR order (yellow form or pink MOST form) -     Chief Complaint  Patient presents with   Medical Management of Chronic Issues   Quality Metric Gaps    Shingrix and flu shot    HPI:  Pt is a 87 y.o. male seen today for medical management of chronic diseases.    Patient has a history of CAD s/p PTCA, chronic atrial fibrillation on Eliquis,  chronic diastolic CHF,Interstitial lung disease with hypoxia on chronic oxygen,  OSA on CPAP doesn't use it  chronic back pain with spinal stenosis, S C-spine kyphosis,  cognitive impairment H/O Rosacea Anxiety and Depression  Patient contineus to be non Ambulatory. Very Sedentory Stays in his bed in his Room most  of the time Does not come out at all. Anxiety Better on Lexapro POA wants to reduce Pill Burden.  He did nto have any comlains Weight is stable. No SOB or Chest pain  Past Medical History:  Diagnosis Date   Anxiety    CAD S/P percutaneous coronary angioplasty 1996, 2009, 01/2010   PTCA of L Cx 1996; BMS-RCA [facility]) 2009; Staged PCI RCA (70% ISR) --> 3.0 mm x 23 mm BMS, staged PCI Diag - 2.0 mm x 12 mm Mini Vision BMS (2.25 mm)   Cataracts, bilateral    immature   Chronic back pain    scoliosis--pt states unable to have surgery bc of age   GERD (gastroesophageal reflux disease)    takes Nexium daily   History of colon polyps    Hyperlipidemia    Hypertension    Myocardial infarction ([place]) 1996, 2009   x 2;last time was about 8-23yrs ago    Obesity (BMI 30.0-34.9)    OSA (obstructive sleep apnea)    Osteoarthritis of both knees    Peripheral edema    Venous Stasis   Persistent atrial fibrillation ([place])    Unable to maintaine NSR after DCCV with Tikosyn.  Plan = rate control w/diltiazem (beta blocker off b/c fatigue). CHA2DS2Vasc 4. DOAC - Eliquis.    Prostate cancer ([place]) 2005   seed implant   Past Surgical History:  Procedure Laterality Date   CARDIOVERSION N/A 06/12/2015   Procedure: CARDIOVERSION;  Surgeon: Sanda Klein, MD;  Location: [facility];  Service: Cardiovascular;  Laterality: N/A;  CARDIOVERSION N/A 08/02/2015   Procedure: CARDIOVERSION;  Surgeon: [name], MD;  Location: [facility];  Service: Cardiovascular;  Laterality: N/A;   [place], 2009, August 2011   PTCA of L Cx 1996; BMS-RCA [facility]) 2009; Staged PCI RCA (70% ISR) --> 3.0 mm x 23 mm BMS, staged PCI Diag - 2.0 mm x 12 mm Mini Vision BMS (2.25 mm)   cyst removed  in college   NEUROPLASTY / TRANSPOSITION MEDIAN NERVE AT CARPAL TUNNEL BILATERAL     NM MYOVIEW LTD  November 2013   EF 53%; fixed mid inferior-inferolateral defect,  infarct with no ischemia.   right knee arthrsoscopy  25yrs ago   right trigger finger     seeds placed into prostate   2005   TEE WITHOUT CARDIOVERSION N/A 06/12/2015   Procedure: TRANSESOPHAGEAL ECHOCARDIOGRAM (TEE) - WITH CARDIOVERSION;  Surgeon: Sanda Klein, MD;  Location: [facility];  Service: Cardiovascular: EF 55-60%.No LA or LAA thrombus. Normal Peal PAP ~33 mmHg.   TOTAL KNEE ARTHROPLASTY  05/09/2012   Procedure: TOTAL KNEE ARTHROPLASTY;  Surgeon: Kerin Salen, MD;  Location: [facility];  Service: Orthopedics;  Laterality: Right;   TRANSTHORACIC ECHOCARDIOGRAM  06/2015   EF 60-65%. Mild LA dilation. Normal PA pressures: 32 mmHg. Aortic sclerosis but no stenosis.   wisdom teeth extraccted      Allergies  Allergen Reactions   Crab [Shellfish Allergy] Rash   Hydrocodone Hives   Adhesive [Tape] Rash   Oxycodone Rash    Allergies as of 03/23/2021       Reactions   Crab [shellfish Allergy] Rash   Hydrocodone Hives   Adhesive [tape] Rash   Oxycodone Rash        Medication List        Accurate as of March 23, 2021 10:57 AM. If you have any questions, ask your nurse or doctor.          STOP taking these medications    magnesium oxide 400 MG tablet Commonly known as: MAG-OX Stopped by: Virgie Dad, MD   Vitamin D3 25 MCG (1000 UT) Caps Stopped by: Virgie Dad, MD       TAKE these medications    acetaminophen 500 MG tablet Commonly known as: TYLENOL Take 1,000 mg by mouth. Three times a day as needed   atorvastatin 40 MG tablet Commonly known as: LIPITOR Take 20 mg by mouth daily. What changed: Another medication with the same name was removed. Continue taking this medication, and follow the directions you see here. Changed by: Virgie Dad, MD   Biofreeze 4 % Gel Generic drug: Menthol (Topical Analgesic) Apply 1 application topically in the morning, at noon, in the evening, and at bedtime. Apply to right great toe   diltiazem 240 MG 24 hr  capsule Commonly known as: DILACOR XR Take 240 mg by mouth daily. For heart failure   Eliquis 5 MG Tabs tablet Generic drug: apixaban TAKE 1 TABLET TWICE A DAY (SCHEDULE FOLLOW UP WITH CARDIOLOGIST PRIOR TO NEXT REFILL REQUEST)   escitalopram 10 MG tablet Commonly known as: LEXAPRO Take 10 mg by mouth daily.   escitalopram 10 MG tablet Commonly known as: LEXAPRO Take 10 mg by mouth daily.   esomeprazole 40 MG capsule Commonly known as: NEXIUM TAKE 1 CAPSULE DAILY   furosemide 40 MG tablet Commonly known as: LASIX Take 40 mg by mouth daily.  losartan 25 MG tablet Commonly known as: COZAAR Take 25 mg by mouth daily.   mirabegron ER 25 MG Tb24 tablet Commonly known as: MYRBETRIQ Take 25 mg by mouth daily.   MIRALAX PO Take 17 g by mouth daily.   Nitrostat 0.4 MG SL tablet Generic drug: nitroGLYCERIN USE AS NEEDED   PreviDent 5000 Booster Plus 1.1 % Pste Generic drug: Sodium Fluoride Place 1 application onto teeth at bedtime.   tamsulosin 0.4 MG Caps capsule Commonly known as: FLOMAX Take 0.4 mg by mouth daily.        Review of Systems  Constitutional:  Positive for activity change.  HENT: Negative.    Respiratory: Negative.    Cardiovascular: Negative.   Gastrointestinal: Negative.   Genitourinary: Negative.   Musculoskeletal:  Positive for gait problem.  Skin: Negative.   Neurological:  Negative for dizziness.  Psychiatric/Behavioral:  Positive for behavioral problems, confusion and dysphoric mood. The patient is nervous/anxious.    Immunization History  Administered Date(s) Administered   H1N1 04/13/2010   Influenza, High Dose Seasonal PF 02/12/2014, 03/29/2018, 01/05/2019   Influenza-Unspecified 03/06/2013, 02/12/2014, 03/06/2017   Moderna SARS-COV2 Booster Vaccination 11/03/2020   Moderna Sars-Covid-2 Vaccination 06/08/2019, 07/06/2019, 04/14/2020   Pneumococcal Conjugate-13 02/12/2014   Pneumococcal Polysaccharide-23 02/04/2013, 02/12/2014    Td 09/04/2005   Tdap 12/11/2012   Zoster, Live 07/07/2006   Pertinent  Health Maintenance Due  Topic Date Due   INFLUENZA VACCINE  01/04/2021   No flowsheet data found. Functional Status Survey:    Vitals:   03/23/21 1048  BP: 130/82  Pulse: 82  Resp: 19  Temp: 98.3 F (36.8 C)  SpO2: 94%  Weight: 212 lb 9.6 oz (96.4 kg)  Height: 5\' 7"  (1.702 m)   Body mass index is 33.3 kg/m. Physical Exam Vitals reviewed.  Constitutional:      Appearance: Normal appearance. He is obese.  HENT:     Head: Normocephalic.     Nose: Nose normal.     Mouth/Throat:     Mouth: Mucous membranes are moist.     Pharynx: Oropharynx is clear.  Eyes:     Pupils: Pupils are equal, round, and reactive to light.  Cardiovascular:     Rate and Rhythm: Normal rate and regular rhythm.     Pulses: Normal pulses.     Heart sounds: Normal heart sounds.  Pulmonary:     Effort: Pulmonary effort is normal.     Breath sounds: Normal breath sounds.  Abdominal:     General: Abdomen is flat. Bowel sounds are normal.     Palpations: Abdomen is soft.  Musculoskeletal:        General: No swelling.     Cervical back: Neck supple.  Skin:    General: Skin is warm.  Neurological:     General: No focal deficit present.     Mental Status: He is alert.  Psychiatric:        Mood and Affect: Mood normal.        Thought Content: Thought content normal.    Labs reviewed: Recent Labs    04/01/20 0000 06/18/20 0000 07/23/20 0000 11/11/20 0000  NA 140 139  --  140  K 4.0 4.0  --  4.0  CL 100 101  --  101  CO2 32* 29*  --  27*  BUN 19 20  --  20  CREATININE 0.9 1.0  --  1.0  CALCIUM 9.2 8.9  --  8.9  MG  --   --  1.6  --    Recent Labs    04/01/20 0000 06/18/20 0000 11/11/20 0000  AST 10* 10* 9*  ALT 4* 4* 4*  ALKPHOS 72 65 63  ALBUMIN 3.5 3.6 3.5   Recent Labs    06/18/20 0000 11/11/20 0000 01/12/21 0000  WBC 9.5 7.3 9.0  NEUTROABS 6,755.00 4,358.00 6,408.00  HGB 13.4* 14.2 14.3  HCT  39* 42 41  PLT 234 205 205   Lab Results  Component Value Date   TSH 3.33 11/11/2020   No results found for: HGBA1C Lab Results  Component Value Date   CHOL 116 11/11/2020   HDL 37 11/11/2020   LDLCALC 63 11/11/2020   TRIG 84 11/11/2020   CHOLHDL 2.9 02/12/2014    Significant Diagnostic Results in last 30 days:  No results found.  Assessment/Plan ILD (interstitial lung disease) (HCC) On Chronic oxygen Does not see Pulmonary anymore Diastolic congestive heart failure, unspecified HF chronicity (HCC) On Low dose of Lasix Creat stable Anxiety On Lexapro now Sometimes does Xanax PRn Persistent atrial fibrillation ([place]):  CHA2DS2-VASc Score 4; On Eliquis On Eliquis and Cardizem OSA (obstructive sleep apnea) Does not use CPAP Essential hypertension On Losartan and Cardizem Iron deficiency anemia, unspecified iron deficiency anemia type Off Iron now HGB stable Hyperlipidemia with target LDL less than 70 On Statin Urinary incontinence, unspecified type On Flomax and Myrbetriq GERD On PPI Family/ staff Communication:   Labs/tests ordered:

## 2021-04-15 ENCOUNTER — Non-Acute Institutional Stay (SKILLED_NURSING_FACILITY): Payer: Medicare Other | Admitting: Nurse Practitioner

## 2021-04-15 ENCOUNTER — Encounter: Payer: Self-pay | Admitting: Nurse Practitioner

## 2021-04-15 DIAGNOSIS — J849 Interstitial pulmonary disease, unspecified: Secondary | ICD-10-CM

## 2021-04-15 DIAGNOSIS — K5900 Constipation, unspecified: Secondary | ICD-10-CM

## 2021-04-15 DIAGNOSIS — D509 Iron deficiency anemia, unspecified: Secondary | ICD-10-CM | POA: Diagnosis not present

## 2021-04-15 DIAGNOSIS — G4733 Obstructive sleep apnea (adult) (pediatric): Secondary | ICD-10-CM

## 2021-04-15 DIAGNOSIS — F419 Anxiety disorder, unspecified: Secondary | ICD-10-CM

## 2021-04-15 DIAGNOSIS — N3942 Incontinence without sensory awareness: Secondary | ICD-10-CM

## 2021-04-15 DIAGNOSIS — M1711 Unilateral primary osteoarthritis, right knee: Secondary | ICD-10-CM

## 2021-04-15 DIAGNOSIS — E785 Hyperlipidemia, unspecified: Secondary | ICD-10-CM

## 2021-04-15 DIAGNOSIS — I5030 Unspecified diastolic (congestive) heart failure: Secondary | ICD-10-CM

## 2021-04-15 DIAGNOSIS — I4819 Other persistent atrial fibrillation: Secondary | ICD-10-CM

## 2021-04-15 DIAGNOSIS — K219 Gastro-esophageal reflux disease without esophagitis: Secondary | ICD-10-CM | POA: Diagnosis not present

## 2021-04-15 DIAGNOSIS — I10 Essential (primary) hypertension: Secondary | ICD-10-CM

## 2021-04-15 NOTE — Assessment & Plan Note (Signed)
stable, on MiraLax qd. 

## 2021-04-15 NOTE — Assessment & Plan Note (Addendum)
No apparent edema, on Furosamide. Bun/creat 20/0.95 11/10/20, update CMP/eGFR

## 2021-04-15 NOTE — Assessment & Plan Note (Signed)
stable, on Lexapro. prn Lorazepam.

## 2021-04-15 NOTE — Assessment & Plan Note (Signed)
stable, off Fe. Hgb 14.3 01/12/21 

## 2021-04-15 NOTE — Assessment & Plan Note (Signed)
O2 dependent

## 2021-04-15 NOTE — Progress Notes (Signed)
Location:   SNF [facility]  Place of Service:  SNF (31) Provider: [place] Trase Bunda NP  Virgie Dad, MD  Patient Care Team: Virgie Dad, MD as PCP - General (Internal Medicine) Leonie Teandre Hamre, MD as PCP - Cardiology (Cardiology)  Extended Emergency Contact Information Primary Emergency Contact: [place] Address: [address]          [place], [place] [address] [name] of [place] Phone: [phone] Mobile Phone: [phone] Relation: Spouse Secondary Emergency Contact: Janey Genta Address: [address]          [place], [place] Phone: [phone] Mobile Phone: [phone] Relation: Daughter  Code Status:  DNR Goals of care: Advanced Directive information Advanced Directives 04/15/2021  Does Patient Have a Medical Advance Directive? Yes  Type of Paramedic of [place];Living will  Does patient want to make changes to medical advance directive? No - Patient declined  Copy of [place] in Chart? Yes - validated most recent copy scanned in chart (See row information)  Would patient like information on creating a medical advance directive? -  Pre-existing out of facility DNR order (yellow form or pink MOST form) -     Chief Complaint  Patient presents with   Medical Management of Chronic Issues    Routine visit   Quality Metric Gaps    Shingrix     HPI:  Pt is a 87 y.o. male seen today for medical management of chronic diseases.      Urinary incontinent, no urinary retention, on Mirabegron 59m qd, Tamsulosin 0.456mqd.              Constipation, stable, on MiraLax qd.              OA, uses mechanical lift for transfer, takes prn Tylenol.             Hx of Interstitial lung disease, O2 dependent                 CHF/minimal edema BLE, on Furosamide. Bun/creat 20/0.95 11/10/20             Anxiety/depression, stable, on Lexapro. prn Lorazepam.              Afib, heart rate is in  control, on Diltiazem 24070md, Eliquis 5mg38md. TSH 3.33 11/11/20             GERD, stable, on Esomeprazole             Anemia, stable, off Fe. Hgb 14.3 01/12/21             HTN, on Losartan, Diltiazem, Furosemide 40mg25m Bun/creat 20/0.95 11/10/20             OSA uses CPAP             Hyperlipidemia, takes Atorvastatin, LDL 63 11/11/20  Past Medical History:  Diagnosis Date   Anxiety    CAD S/P percutaneous coronary angioplasty 1996, 2009, 01/2010   PTCA of L Cx 1996; [facility]; Staged PCI RCA (70% ISR) --> 3.0 mm x 23 mm BMS, staged PCI Diag - 2.0 mm x 12 mm Mini Vision BMS (2.25 mm)   Cataracts, bilateral    immature   Chronic back pain    scoliosis--pt states unable to have surgery bc of age   GERD (gastroesophageal reflux disease)    takes Nexium daily   History of colon polyps  Hyperlipidemia    Hypertension    Myocardial infarction ([place]) 1996, 2009   x 2;last time was about 8-32yr ago    Obesity (BMI 30.0-34.9)    OSA (obstructive sleep apnea)    Osteoarthritis of both knees    Peripheral edema    Venous Stasis   Persistent [place] fibrillation [place]    Unable to maintaine NSR after DCCV with Tikosyn.  Plan = rate control w/diltiazem (beta blocker off b/c fatigue). CHA2DS2Vasc 4. DOAC - Eliquis.    [place]   seed implant   Past Surgical History:  Procedure Laterality Date   CARDIOVERSION N/A 06/12/2015   Procedure: CARDIOVERSION;  Surgeon: MSanda Klein MD;  [facility];  Service: Cardiovascular;  Laterality: N/A;   CARDIOVERSION N/A 08/02/2015   Procedure: CARDIOVERSION;  Surgeon: JThompson Grayer MD;  [facility]  Service: Cardiovascular;  Laterality: N/A;   [place] 2009, August 2011   PTCA of L Cx 1996; BMS-RCA ([facility] 2009; Staged PCI RCA (70% ISR) --> 3.0 mm x 23 mm BMS, staged PCI Diag - 2.0 mm x 12 mm Mini Vision BMS (2.25 mm)   cyst removed  in  college   NEUROPLASTY / TRANSPOSITION MEDIAN NERVE AT CARPAL TUNNEL BILATERAL     [facility]  November 2013   EF 53%; fixed mid inferior-inferolateral defect, infarct with no ischemia.   right knee arthrsoscopy  124yrago   right trigger finger     seeds placed into prostate   2005   TEE WITHOUT CARDIOVERSION N/A 06/12/2015   Procedure: TRANSESOPHAGEAL ECHOCARDIOGRAM (TEE) - WITH CARDIOVERSION;  Surgeon: MiSanda KleinMD;  [facility] Service: Cardiovascular: EF 55-60%.No LA or LAA thrombus. Normal Peal PAP ~33 mmHg.   TOTAL KNEE ARTHROPLASTY  05/09/2012   Procedure: TOTAL KNEE ARTHROPLASTY;  Surgeon: FrKerin SalenMD;  [facility] Service: Orthopedics;  Laterality: Right;   TRANSTHORACIC ECHOCARDIOGRAM  06/2015   EF 60-65%. Mild LA dilation. Normal PA pressures: 32 mmHg. Aortic sclerosis but no stenosis.   wisdom teeth extraccted      Allergies  Allergen Reactions   Crab [Shellfish Allergy] Rash   Hydrocodone Hives   Adhesive [Tape] Rash   Oxycodone Rash    Allergies as of 04/15/2021       Reactions   Crab [shellfish Allergy] Rash   Hydrocodone Hives   Adhesive [tape] Rash   Oxycodone Rash        Medication List        Accurate as of April 15, 2021 11:59 PM. If you have any questions, ask your nurse or doctor.          acetaminophen 325 MG tablet Commonly known as: TYLENOL Take 650 mg by mouth every 6 (six) hours as needed.   ALPRAZolam 0.5 MG tablet Commonly known as: XANAX Take 0.5 mg by mouth as needed for anxiety. Every 6 Hours   atorvastatin 40 MG tablet Commonly known as: LIPITOR Take 20 mg by mouth daily.   Biofreeze 4 % Gel Generic drug: Menthol (Topical Analgesic) Apply 1 application topically in the morning, at noon, in the evening, and at bedtime. Apply to right great toe   diltiazem 240 MG 24 hr capsule Commonly known as: DILACOR XR Take 240 mg by mouth daily. For heart failure   Eliquis 5 MG Tabs tablet Generic  drug: apixaban TAKE 1 TABLET  TWICE A DAY (SCHEDULE FOLLOW UP WITH CARDIOLOGIST PRIOR TO NEXT REFILL REQUEST)   escitalopram 10 MG tablet Commonly known as: LEXAPRO Take 10 mg by mouth daily.   esomeprazole 40 MG capsule Commonly known as: NEXIUM TAKE 1 CAPSULE DAILY   furosemide 40 MG tablet Commonly known as: LASIX Take 40 mg by mouth daily.   losartan 25 MG tablet Commonly known as: COZAAR Take 25 mg by mouth daily.   mirabegron ER 25 MG Tb24 tablet Commonly known as: MYRBETRIQ Take 25 mg by mouth daily.   MIRALAX PO Take 17 g by mouth daily.   Nitrostat 0.4 MG SL tablet Generic drug: nitroGLYCERIN USE AS NEEDED   PreviDent 5000 Booster Plus 1.1 % Pste Generic drug: Sodium Fluoride Place 1 application onto teeth at bedtime.   tamsulosin 0.4 MG Caps capsule Commonly known as: FLOMAX Take 0.4 mg by mouth daily.        Review of Systems  Constitutional:  Negative for fatigue, fever and unexpected weight change.  HENT:  Positive for hearing loss. Negative for congestion and trouble swallowing.   Eyes:  Negative for visual disturbance.  Respiratory:  Positive for shortness of breath. Negative for cough.        O2 dependent. DOE  Cardiovascular:  Negative for leg swelling.  Gastrointestinal:  Negative for abdominal pain and constipation.       Incontinent of feces.   Genitourinary:  Positive for frequency. Negative for dysuria and urgency.       Incontinent of urine.   Musculoskeletal:  Positive for arthralgias and gait problem.       Left knee pain is chronic. The right great toe pain at the base of the toe nail, but no warmth, redness, or swelling.   Skin:  Negative for color change.  Neurological:  Negative for speech difficulty, weakness and headaches.       Memory lapses.   Psychiatric/Behavioral:  Negative for behavioral problems and sleep disturbance. The patient is nervous/anxious.    Immunization History  Administered Date(s) Administered   H1N1  04/13/2010   Influenza, High Dose Seasonal PF 02/12/2014, 03/29/2018, 01/05/2019   Influenza-Unspecified 03/06/2013, 02/12/2014, 03/06/2017, 03/24/2021   Moderna SARS-COV2 Booster Vaccination 11/03/2020   Moderna Sars-Covid-2 Vaccination 06/08/2019, 07/06/2019, 04/14/2020   PFIZER(Purple Top)SARS-COV-2 Vaccination 02/23/2021   Pneumococcal Conjugate-13 02/12/2014   Pneumococcal Polysaccharide-23 02/04/2013, 02/12/2014   Td 09/04/2005   Tdap 12/11/2012   Zoster, Live 07/07/2006   Pertinent  Health Maintenance Due  Topic Date Due   INFLUENZA VACCINE  Completed   Fall Risk 01/30/2019  Patient Fall Risk Level Low fall risk   Functional Status Survey:    Vitals:   04/15/21 1525  BP: 127/80  Pulse: 80  Resp: 18  Temp: 98.6 F (37 C)  SpO2: 95%  Weight: 211 lb 4.8 oz (95.8 kg)  Height: _0  (1.702 m)   Body mass index is 33.09 kg/m. Physical Exam Vitals and nursing note reviewed.  Constitutional:      Appearance: Normal appearance.  HENT:     Head: Normocephalic and atraumatic.  Eyes:     Extraocular Movements: Extraocular movements intact.     Conjunctiva/sclera: Conjunctivae normal.     Pupils: Pupils are equal, round, and reactive to light.  Cardiovascular:     Rate and Rhythm: Normal rate. Rhythm irregular.     Heart sounds: No murmur heard. Pulmonary:     Breath sounds: No wheezing.     Comments: O2 dependent.  Abdominal:  General: Bowel sounds are normal.     Palpations: Abdomen is soft.     Tenderness: There is no abdominal tenderness.  Musculoskeletal:     Cervical back: Normal range of motion and neck supple.     Right lower leg: No edema.     Left lower leg: No edema.     Comments: Trace edema BLE., less than prior, near none.  Mechanical lift for transfer.   Skin:    General: Skin is warm and dry.  Neurological:     General: No focal deficit present.     Mental Status: He is alert and oriented to person, place, and time. Mental status is at  baseline.     Gait: Gait abnormal.  Psychiatric:        Mood and Affect: Mood normal.        Behavior: Behavior normal.        Thought Content: Thought content normal.        Judgment: Judgment normal.    Labs reviewed: Recent Labs    06/18/20 0000 07/23/20 0000 11/11/20 0000  NA 139  --  140  K 4.0  --  4.0  CL 101  --  101  CO2 29*  --  27*  BUN 20  --  20  CREATININE 1.0  --  1.0  CALCIUM 8.9  --  8.9  MG  --  1.6  --    Recent Labs    06/18/20 0000 11/11/20 0000  AST 10* 9*  ALT 4* 4*  ALKPHOS 65 63  ALBUMIN 3.6 3.5   Recent Labs    06/18/20 0000 11/11/20 0000 01/12/21 0000  WBC 9.5 7.3 9.0  NEUTROABS 6,755.00 4,358.00 6,408.00  HGB 13.4* 14.2 14.3  HCT 39* 42 41  PLT 234 205 205   Lab Results  Component Value Date   TSH 3.33 11/11/2020   No results found for: HGBA1C Lab Results  Component Value Date   CHOL 116 11/11/2020   HDL 37 11/11/2020   LDLCALC 63 11/11/2020   TRIG 84 11/11/2020   CHOLHDL 2.9 02/12/2014    Significant Diagnostic Results in last 30 days:  No results found.  Assessment/Plan  Anxiety stable, on Lexapro. prn Lorazepam.   Persistent atrial fibrillation ([place]):  CHA2DS2-VASc Score 4; On Eliquis heart rate is in control, on Diltiazem 26m qd, Eliquis 540mbid. TSH 3.33 11/11/20  GERD (gastroesophageal reflux disease) stable, on Esomeprazole  Iron deficiency anemia  stable, off Fe. Hgb 14.3 01/12/21  Essential hypertension on Losartan, Diltiazem, Furosemide 4085md. Bun/creat 20/0.95 11/10/20  OSA (obstructive sleep apnea) uses CPAP  Hyperlipidemia with target LDL less than 70 takes Atorvastatin, LDL 63 6/84/8/18iastolic CHF (HCC) minimal edema BLE, on Furosamide. Bun/creat 20/0.95 11/10/20  ILD (interstitial lung disease) ([place] dependent     Osteoarthritis of right knee  uses mechanical lift for transfer, takes prn Tylenol.  Constipation  stable, on MiraLax qd.   Urinary incontinence Urinary incontinent,  no urinary retention, on Mirabegron 19m42m, Tamsulosin 0.4mg 59m    Family/ staff Communication: plan of care reviewed with the patient and charge nurse.   Labs/tests ordered: CMP/eGFR  Time spend 35 minutes.

## 2021-04-15 NOTE — Assessment & Plan Note (Signed)
Urinary incontinent, no urinary retention, on Mirabegron 25mg  qd, Tamsulosin 0.4mg  qd.

## 2021-04-15 NOTE — Assessment & Plan Note (Signed)
takes Atorvastatin, LDL 63 11/11/20

## 2021-04-15 NOTE — Assessment & Plan Note (Signed)
uses CPAP 

## 2021-04-15 NOTE — Assessment & Plan Note (Signed)
on Losartan, Diltiazem, Furosemide 40mg  qd. Bun/creat 20/0.95 11/10/20

## 2021-04-15 NOTE — Assessment & Plan Note (Signed)
heart rate is in control, on Diltiazem 240mg  qd, Eliquis 5mg  bid. TSH 3.33 11/11/20

## 2021-04-15 NOTE — Assessment & Plan Note (Signed)
stable, on Esomeprazole 

## 2021-04-15 NOTE — Assessment & Plan Note (Signed)
uses mechanical lift for transfer, takes prn Tylenol. 

## 2021-04-20 LAB — HEPATIC FUNCTION PANEL
ALT: 3 — AB (ref 10–40)
AST: 9 — AB (ref 14–40)
Alkaline Phosphatase: 62 (ref 25–125)
Bilirubin, Total: 0.6

## 2021-04-20 LAB — BASIC METABOLIC PANEL
BUN: 19 (ref 4–21)
CO2: 32 — AB (ref 13–22)
Chloride: 101 (ref 99–108)
Creatinine: 0.9 (ref <=1.3)
Glucose: 106
Potassium: 4.2 (ref 3.4–5.3)
Sodium: 138 (ref 137–147)

## 2021-04-20 LAB — COMPREHENSIVE METABOLIC PANEL
Albumin: 3.5 (ref 3.5–5.0)
Calcium: 8.7 (ref 8.7–10.7)
Globulin: 2.8

## 2021-04-23 ENCOUNTER — Non-Acute Institutional Stay (SKILLED_NURSING_FACILITY): Payer: Medicare Other | Admitting: Nurse Practitioner

## 2021-04-23 DIAGNOSIS — I10 Essential (primary) hypertension: Secondary | ICD-10-CM

## 2021-04-23 DIAGNOSIS — F419 Anxiety disorder, unspecified: Secondary | ICD-10-CM

## 2021-04-23 DIAGNOSIS — N3942 Incontinence without sensory awareness: Secondary | ICD-10-CM

## 2021-04-23 DIAGNOSIS — K219 Gastro-esophageal reflux disease without esophagitis: Secondary | ICD-10-CM

## 2021-04-23 DIAGNOSIS — K623 Rectal prolapse: Secondary | ICD-10-CM

## 2021-04-23 DIAGNOSIS — M1711 Unilateral primary osteoarthritis, right knee: Secondary | ICD-10-CM

## 2021-04-23 DIAGNOSIS — I4819 Other persistent atrial fibrillation: Secondary | ICD-10-CM

## 2021-04-23 DIAGNOSIS — I5030 Unspecified diastolic (congestive) heart failure: Secondary | ICD-10-CM

## 2021-04-23 DIAGNOSIS — D509 Iron deficiency anemia, unspecified: Secondary | ICD-10-CM

## 2021-04-23 DIAGNOSIS — L03011 Cellulitis of right finger: Secondary | ICD-10-CM | POA: Diagnosis not present

## 2021-04-23 DIAGNOSIS — J849 Interstitial pulmonary disease, unspecified: Secondary | ICD-10-CM

## 2021-04-23 NOTE — Assessment & Plan Note (Signed)
O2 dependent

## 2021-04-23 NOTE — Assessment & Plan Note (Signed)
Urinary incontinent, no urinary retention, on Mirabegron 25mg  qd, Tamsulosin 0.4mg  qd.

## 2021-04-23 NOTE — Assessment & Plan Note (Signed)
stable, on Lexapro. prn Lorazepam.

## 2021-04-23 NOTE — Assessment & Plan Note (Signed)
stable, off Fe. Hgb 14.3 01/12/21 

## 2021-04-23 NOTE — Assessment & Plan Note (Signed)
Avoid constipation , stable, on MiraLax qd.

## 2021-04-23 NOTE — Assessment & Plan Note (Addendum)
CHF/minimal edema BLE, on Furosamide.  Bun/creat 19/0.85 04/20/21

## 2021-04-23 NOTE — Assessment & Plan Note (Signed)
Redness, tenderness, swelling ulnar aspect of the base of the right thumb nail, will apply Bactroban bid until healed. Wash the area with soap and water, warm water soaking up to 73min/each, 4x/day for 7 days. May lance if purulence drainage become apparent.

## 2021-04-23 NOTE — Progress Notes (Signed)
Location:   SNF [place] Room Number: 5 A Place of Service:  SNF (31) Provider: [facility] Denina Rieger NP  Virgie Dad, MD  Patient Care Team: Virgie Dad, MD as PCP - General (Internal Medicine) Leonie Vyron Fronczak, MD as PCP - Cardiology (Cardiology)  Extended Emergency Contact Information Primary Emergency Contact: [facility] Address: [address]          [place], [place] [address] [name] of [place] Phone: [phone] Mobile Phone: [phone] Relation: Spouse Secondary Emergency Contact: Janey Genta Address: [address]          [place], [place] of [place] Phone: [phone] Mobile Phone: [phone] Relation: Daughter  Code Status: DNR Goals of care: Advanced Directive information Advanced Directives 04/23/2021  Does Patient Have a Medical Advance Directive? Yes  Type of Paramedic of [place];Living will  Does patient want to make changes to medical advance directive? No - Patient declined  Copy of [place] in Chart? Yes - validated most recent copy scanned in chart (See row information)  Would patient like information on creating a medical advance directive? -  Pre-existing out of facility DNR order (yellow form or pink MOST form) -     Chief Complaint  Patient presents with   Acute Visit    Acute visit for Right Thumb     HPI:  Pt is a 87 y.o. male seen today for an acute visit for reported new erythema to right thumb beside finger nail.                  Urinary incontinent, no urinary retention, on Mirabegron 25mg  qd, Tamsulosin 0.4mg  qd.              Constipation, stable, on MiraLax qd.              OA, uses mechanical lift for transfer, takes prn Tylenol.             Hx of Interstitial lung disease, O2 dependent                 CHF/minimal edema BLE, on Furosamide.    Bun/creat 19/0.8511/15/22           Anxiety/depression, stable, on Lexapro. prn Lorazepam.               Afib, heart rate is in control, on Diltiazem 240mg  qd, Eliquis 5mg  bid. TSH 3.33 11/11/20             GERD, stable, on Esomeprazole             Anemia, stable, off Fe. Hgb 14.3 01/12/21             HTN, on Losartan, Diltiazem, Furosemide 40mg  qd.  Bun/creat 19/0.85 04/20/21             OSA uses CPAP             Hyperlipidemia, takes Atorvastatin, LDL 63 11/11/20 Past Medical History:  Diagnosis Date   Anxiety    CAD S/P percutaneous coronary angioplasty 1996, 2009, 01/2010   PTCA of L Cx 1996; BMS-RCA [facility]) 2009; Staged PCI RCA (70% ISR) --> 3.0 mm x 23 mm BMS, staged PCI Diag - 2.0 mm x 12 mm Mini Vision BMS (2.25 mm)   Cataracts, bilateral    immature   Chronic back pain    scoliosis--pt states unable to have surgery bc of age   GERD (gastroesophageal reflux disease)  takes Nexium daily   History of colon polyps    Hyperlipidemia    Hypertension    Myocardial infarction ([place]) 1996, 2009   x 2;last time was about 8-4yrs ago    Obesity (BMI 30.0-34.9)    OSA (obstructive sleep apnea)    Osteoarthritis of both knees    Peripheral edema    Venous Stasis   Persistent atrial fibrillation ([place])    Unable to maintaine NSR after DCCV with Tikosyn.  Plan = rate control w/diltiazem (beta blocker off b/c fatigue). CHA2DS2Vasc 4. DOAC - Eliquis.    Prostate cancer ([place]) 2005   seed implant   Past Surgical History:  Procedure Laterality Date   CARDIOVERSION N/A 06/12/2015   Procedure: CARDIOVERSION;  Surgeon: Sanda Klein, MD;  Location: [facility];  Service: Cardiovascular;  Laterality: N/A;   CARDIOVERSION N/A 08/02/2015   Procedure: CARDIOVERSION;  Surgeon: [name], MD;  Location: [facility];  Service: Cardiovascular;  Laterality: N/A;   [place], 2009, August 2011   PTCA of L Cx 1996; BMS-RCA [facility]) 2009; Staged PCI RCA (70% ISR) --> 3.0 mm x 23 mm BMS, staged PCI Diag - 2.0 mm x 12 mm Mini Vision BMS  (2.25 mm)   cyst removed  in college   NEUROPLASTY / TRANSPOSITION MEDIAN NERVE AT CARPAL TUNNEL BILATERAL     NM MYOVIEW LTD  November 2013   EF 53%; fixed mid inferior-inferolateral defect, infarct with no ischemia.   right knee arthrsoscopy  38yrs ago   right trigger finger     seeds placed into prostate   2005   TEE WITHOUT CARDIOVERSION N/A 06/12/2015   Procedure: TRANSESOPHAGEAL ECHOCARDIOGRAM (TEE) - WITH CARDIOVERSION;  Surgeon: Sanda Klein, MD;  Location: [facility];  Service: Cardiovascular: EF 55-60%.No LA or LAA thrombus. Normal Peal PAP ~33 mmHg.   TOTAL KNEE ARTHROPLASTY  05/09/2012   Procedure: TOTAL KNEE ARTHROPLASTY;  Surgeon: Kerin Salen, MD;  Location: [facility];  Service: Orthopedics;  Laterality: Right;   TRANSTHORACIC ECHOCARDIOGRAM  06/2015   EF 60-65%. Mild LA dilation. Normal PA pressures: 32 mmHg. Aortic sclerosis but no stenosis.   wisdom teeth extraccted      Allergies  Allergen Reactions   Crab [Shellfish Allergy] Rash   Hydrocodone Hives   Adhesive [Tape] Rash   Oxycodone Rash    Allergies as of 04/23/2021       Reactions   Crab [shellfish Allergy] Rash   Hydrocodone Hives   Adhesive [tape] Rash   Oxycodone Rash        Medication List        Accurate as of April 23, 2021 11:59 PM. If you have any questions, ask your nurse or doctor.          acetaminophen 325 MG tablet Commonly known as: TYLENOL Take 650 mg by mouth every 6 (six) hours as needed.   ALPRAZolam 0.5 MG tablet Commonly known as: XANAX Take 0.5 mg by mouth as needed for anxiety. Every 6 Hours   atorvastatin 40 MG tablet Commonly known as: LIPITOR Take 20 mg by mouth daily.   Biofreeze 4 % Gel Generic drug: Menthol (Topical Analgesic) Apply 1 application topically in the morning, at noon, in the evening, and at bedtime. Apply to right great toe   diltiazem 240 MG 24 hr capsule Commonly known as: DILACOR XR Take 240 mg by mouth daily. For heart failure  Eliquis 5 MG Tabs tablet Generic drug: apixaban TAKE 1 TABLET TWICE A DAY (SCHEDULE FOLLOW UP WITH CARDIOLOGIST PRIOR TO NEXT REFILL REQUEST)   escitalopram 10 MG tablet Commonly known as: LEXAPRO Take 10 mg by mouth daily.   esomeprazole 40 MG capsule Commonly known as: NEXIUM TAKE 1 CAPSULE DAILY   furosemide 40 MG tablet Commonly known as: LASIX Take 40 mg by mouth daily.   losartan 25 MG tablet Commonly known as: COZAAR Take 25 mg by mouth daily.   mirabegron ER 25 MG Tb24 tablet Commonly known as: MYRBETRIQ Take 25 mg by mouth daily.   MIRALAX PO Take 17 g by mouth daily.   mupirocin ointment 2 % Commonly known as: BACTROBAN 1 application every evening. Apply to Right thumb reddened area until healed.   Nitrostat 0.4 MG SL tablet Generic drug: nitroGLYCERIN USE AS NEEDED   PreviDent 5000 Booster Plus 1.1 % Pste Generic drug: Sodium Fluoride Place 1 application onto teeth at bedtime.   tamsulosin 0.4 MG Caps capsule Commonly known as: FLOMAX Take 0.4 mg by mouth daily.        Review of Systems  Constitutional:  Negative for appetite change, fatigue and fever.  HENT:  Positive for hearing loss. Negative for congestion and trouble swallowing.   Eyes:  Negative for visual disturbance.  Respiratory:  Positive for shortness of breath. Negative for cough.        O2 dependent. DOE  Cardiovascular:  Negative for leg swelling.  Gastrointestinal:  Negative for abdominal pain and constipation.       Incontinent of feces.   Genitourinary:  Positive for frequency. Negative for dysuria and urgency.       Incontinent of urine.   Musculoskeletal:  Positive for arthralgias and gait problem.       Left knee pain is chronic. The right great toe pain at the base of the toe nail, but no warmth, redness, or swelling.   Skin:        Base of the right thumb redness  Neurological:  Negative for speech difficulty, weakness and headaches.       Memory lapses.    Psychiatric/Behavioral:  Negative for behavioral problems and sleep disturbance. The patient is nervous/anxious.    Immunization History  Administered Date(s) Administered   H1N1 04/13/2010   Influenza, High Dose Seasonal PF 02/12/2014, 03/29/2018, 01/05/2019   Influenza-Unspecified 03/06/2013, 02/12/2014, 03/06/2017, 03/17/2020, 03/24/2021   Moderna SARS-COV2 Booster Vaccination 11/03/2020   Moderna Sars-Covid-2 Vaccination 06/08/2019, 07/06/2019, 04/14/2020   PFIZER(Purple Top)SARS-COV-2 Vaccination 02/23/2021   Pneumococcal Conjugate-13 02/12/2014   Pneumococcal Polysaccharide-23 02/04/2013, 02/12/2014   Td 09/04/2005   Tdap 12/11/2012   Zoster, Live 07/07/2006   Pertinent  Health Maintenance Due  Topic Date Due   INFLUENZA VACCINE  Completed   Fall Risk 01/30/2019  Patient Fall Risk Level Low fall risk   Functional Status Survey:    Vitals:   04/27/21 1505  BP: 105/72  Pulse: 88  Resp: 18  Temp: 97.8 F (36.6 C)  SpO2: 95%  Weight: 211 lb 4.8 oz (95.8 kg)  Height: 5\' 7"  (1.702 m)   Body mass index is 33.09 kg/m. Physical Exam Vitals and nursing note reviewed.  Constitutional:      Appearance: Normal appearance.  HENT:     Head: Normocephalic and atraumatic.  Eyes:     Extraocular Movements: Extraocular movements intact.     Conjunctiva/sclera: Conjunctivae normal.     Pupils: Pupils are equal, round, and reactive to light.  Cardiovascular:     Rate and Rhythm: Normal rate. Rhythm irregular.     Heart sounds: No murmur heard. Pulmonary:     Breath sounds: No wheezing.     Comments: O2 dependent.  Abdominal:     General: Bowel sounds are normal.     Palpations: Abdomen is soft.     Tenderness: There is no abdominal tenderness.  Musculoskeletal:     Cervical back: Normal range of motion and neck supple.     Right lower leg: No edema.     Left lower leg: No edema.     Comments: Trace edema BLE., less than prior, near none.  Mechanical lift for transfer.    Skin:    General: Skin is warm and dry.     Findings: Erythema present.     Comments: Redness, tenderness, swelling ulnar aspect of the base of the right thumb nail,   Neurological:     General: No focal deficit present.     Mental Status: He is alert and oriented to person, place, and time. Mental status is at baseline.     Gait: Gait abnormal.  Psychiatric:        Mood and Affect: Mood normal.        Behavior: Behavior normal.        Thought Content: Thought content normal.        Judgment: Judgment normal.    Labs reviewed: Recent Labs    06/18/20 0000 07/23/20 0000 11/11/20 0000 04/20/21 0000  NA 139  --  140 138  K 4.0  --  4.0 4.2  CL 101  --  101 101  CO2 29*  --  27* 32*  BUN 20  --  20 19  CREATININE 1.0  --  1.0 0.9  CALCIUM 8.9  --  8.9 8.7  MG  --  1.6  --   --    Recent Labs    06/18/20 0000 11/11/20 0000 04/20/21 0000  AST 10* 9* 9*  ALT 4* 4* 3*  ALKPHOS 65 63 62  ALBUMIN 3.6 3.5 3.5   Recent Labs    06/18/20 0000 11/11/20 0000 01/12/21 0000  WBC 9.5 7.3 9.0  NEUTROABS 6,755.00 4,358.00 6,408.00  HGB 13.4* 14.2 14.3  HCT 39* 42 41  PLT 234 205 205   Lab Results  Component Value Date   TSH 3.33 11/11/2020   No results found for: HGBA1C Lab Results  Component Value Date   CHOL 116 11/11/2020   HDL 37 11/11/2020   LDLCALC 63 11/11/2020   TRIG 84 11/11/2020   CHOLHDL 2.9 02/12/2014    Significant Diagnostic Results in last 30 days:  No results found.  Assessment/Plan: Paronychia of right thumb Redness, tenderness, swelling ulnar aspect of the base of the right thumb nail, will apply Bactroban bid until healed. Wash the area with soap and water, warm water soaking up to 32min/each, 4x/day for 7 days. May lance if purulence drainage become apparent.   Urinary incontinence Urinary incontinent, no urinary retention, on Mirabegron 25mg  qd, Tamsulosin 0.4mg  qd.   Rectal mucosa prolapse Avoid constipation , stable, on MiraLax qd.    Osteoarthritis of right knee  uses mechanical lift for transfer, takes prn Tylenol.               ILD (interstitial lung disease) (HCC) O2 dependent      Diastolic CHF (HCC) CHF/minimal edema BLE, on Furosamide.  Bun/creat 19/0.85 04/20/21   Anxiety stable, on Lexapro.  prn Lorazepam.   Persistent atrial fibrillation ([place]):  CHA2DS2-VASc Score 4; On Eliquis eart rate is in control, on Diltiazem 240mg  qd, Eliquis 5mg  bid. TSH 3.33 11/11/20  GERD (gastroesophageal reflux disease) stable, on Esomeprazole  Iron deficiency anemia  stable, off Fe. Hgb 14.3 01/12/21  Essential hypertension Blood pressure is controlled, Bun/creat 19/0.85 04/20/21    Family/ staff Communication: plan of care reviewed with the patient and charge nurse.   Labs/tests ordered:  none  Time spend 35 minutes.

## 2021-04-23 NOTE — Assessment & Plan Note (Signed)
eart rate is in control, on Diltiazem 240mg  qd, Eliquis 5mg  bid. TSH 3.33 11/11/20

## 2021-04-23 NOTE — Assessment & Plan Note (Signed)
Blood pressure is controlled, Bun/creat 19/0.85 04/20/21

## 2021-04-23 NOTE — Assessment & Plan Note (Signed)
uses mechanical lift for transfer, takes prn Tylenol. 

## 2021-04-23 NOTE — Assessment & Plan Note (Signed)
stable, on Esomeprazole 

## 2021-04-26 ENCOUNTER — Encounter: Payer: Self-pay | Admitting: Nurse Practitioner

## 2021-04-27 ENCOUNTER — Encounter: Payer: Self-pay | Admitting: Nurse Practitioner

## 2021-04-28 ENCOUNTER — Encounter: Payer: Self-pay | Admitting: Nurse Practitioner

## 2021-05-17 ENCOUNTER — Encounter: Payer: Self-pay | Admitting: Nurse Practitioner

## 2021-05-17 ENCOUNTER — Non-Acute Institutional Stay (SKILLED_NURSING_FACILITY): Payer: Medicare Other | Admitting: Nurse Practitioner

## 2021-05-17 DIAGNOSIS — D509 Iron deficiency anemia, unspecified: Secondary | ICD-10-CM | POA: Diagnosis not present

## 2021-05-17 DIAGNOSIS — K219 Gastro-esophageal reflux disease without esophagitis: Secondary | ICD-10-CM

## 2021-05-17 DIAGNOSIS — F419 Anxiety disorder, unspecified: Secondary | ICD-10-CM

## 2021-05-17 DIAGNOSIS — N3942 Incontinence without sensory awareness: Secondary | ICD-10-CM

## 2021-05-17 DIAGNOSIS — I4819 Other persistent atrial fibrillation: Secondary | ICD-10-CM

## 2021-05-17 DIAGNOSIS — G4733 Obstructive sleep apnea (adult) (pediatric): Secondary | ICD-10-CM

## 2021-05-17 DIAGNOSIS — I10 Essential (primary) hypertension: Secondary | ICD-10-CM

## 2021-05-17 DIAGNOSIS — I5030 Unspecified diastolic (congestive) heart failure: Secondary | ICD-10-CM

## 2021-05-17 DIAGNOSIS — J849 Interstitial pulmonary disease, unspecified: Secondary | ICD-10-CM

## 2021-05-17 DIAGNOSIS — M1711 Unilateral primary osteoarthritis, right knee: Secondary | ICD-10-CM

## 2021-05-17 DIAGNOSIS — E785 Hyperlipidemia, unspecified: Secondary | ICD-10-CM | POA: Diagnosis not present

## 2021-05-17 NOTE — Progress Notes (Signed)
Location:   [facility] Room Number: 5 A Place of Service:  SNF (31) Provider:  Juliano Mceachin X, NP  Virgie Dad, MD  Patient Care Team: Virgie Dad, MD as PCP - General (Internal Medicine) Leonie Adabelle Griffiths, MD as PCP - Cardiology (Cardiology)  Extended Emergency Contact Information Primary Emergency Contact: [facility] Address: [address]          [place], [place] [address] [name] of [place] Phone: [phone] Mobile Phone: [phone] Relation: Spouse Secondary Emergency Contact: Janey Genta Address: [address]          [place], [place] of [place] Phone: [phone] Mobile Phone: [phone] Relation: Daughter  Code Status:  DNR Goals of care: Advanced Directive information Advanced Directives 05/17/2021  Does Patient Have a Medical Advance Directive? Yes  Type of Paramedic of [place];Living will  Does patient want to make changes to medical advance directive? No - Patient declined  Copy of [place] in Chart? Yes - validated most recent copy scanned in chart (See row information)  Would patient like information on creating a medical advance directive? -  Pre-existing out of facility DNR order (yellow form or pink MOST form) -     Chief Complaint  Patient presents with   Medical Management of Chronic Issues    Routine Visit   Quality Metric Gaps    [place], COVID#5    HPI:  Pt is a 87 y.o. male seen today for medical management of chronic diseases.     Urinary incontinent, no urinary retention, on Mirabegron, Tamsulosin              Constipation, stable, on MiraLax qd.              OA, uses mechanical lift for transfer, takes prn Tylenol.             Hx of Interstitial lung disease, O2 dependent                 CHF/minimal edema BLE, on Furosamide. Bun/creat 19/0.8511/15/22           Anxiety/depression, stable, on Lexapro. prn Alprazolam              Afib, heart rate is in control, on Diltiazem 240mg  qd, Eliquis 5mg  bid. TSH 3.33 11/11/20             GERD, stable, on Esomeprazole             Anemia, stable, off Fe. Hgb 14.3 01/12/21             HTN, on Losartan, Diltiazem, Furosemide 40mg  qd.  Bun/creat 19/0.85 04/20/21             OSA uses CPAP             Hyperlipidemia, takes Atorvastatin, LDL 63 11/11/20 Past Medical History:  Diagnosis Date   Anxiety    CAD S/P percutaneous coronary angioplasty 1996, 2009, 01/2010   PTCA of L Cx 1996; BMS-RCA [facility]) 2009; Staged PCI RCA (70% ISR) --> 3.0 mm x 23 mm BMS, staged PCI Diag - 2.0 mm x 12 mm Mini Vision BMS (2.25 mm)   Cataracts, bilateral    immature   Chronic back pain    scoliosis--pt states unable to have surgery bc of age   GERD (gastroesophageal reflux disease)    takes Nexium daily   History of colon polyps  Hyperlipidemia    Hypertension    Myocardial infarction ([place]) 1996, 2009   x 2;last time was about 8-50yrs ago    Obesity (BMI 30.0-34.9)    OSA (obstructive sleep apnea)    Osteoarthritis of both knees    Peripheral edema    Venous Stasis   Persistent atrial fibrillation ([place])    Unable to maintaine NSR after DCCV with Tikosyn.  Plan = rate control w/diltiazem (beta blocker off b/c fatigue). CHA2DS2Vasc 4. DOAC - Eliquis.    Prostate cancer ([place]) 2005   seed implant   Past Surgical History:  Procedure Laterality Date   CARDIOVERSION N/A 06/12/2015   Procedure: CARDIOVERSION;  Surgeon: Sanda Klein, MD;  Location: [facility];  Service: Cardiovascular;  Laterality: N/A;   CARDIOVERSION N/A 08/02/2015   Procedure: CARDIOVERSION;  Surgeon: [name], MD;  Location: [facility];  Service: Cardiovascular;  Laterality: N/A;   [place], 2009, August 2011   PTCA of L Cx 1996; BMS-RCA [facility]) 2009; Staged PCI RCA (70% ISR) --> 3.0 mm x 23 mm BMS, staged PCI Diag - 2.0 mm x 12 mm Mini Vision BMS  (2.25 mm)   cyst removed  in college   NEUROPLASTY / TRANSPOSITION MEDIAN NERVE AT CARPAL TUNNEL BILATERAL     NM MYOVIEW LTD  November 2013   EF 53%; fixed mid inferior-inferolateral defect, infarct with no ischemia.   right knee arthrsoscopy  38yrs ago   right trigger finger     seeds placed into prostate   2005   TEE WITHOUT CARDIOVERSION N/A 06/12/2015   Procedure: TRANSESOPHAGEAL ECHOCARDIOGRAM (TEE) - WITH CARDIOVERSION;  Surgeon: Sanda Klein, MD;  Location: [facility];  Service: Cardiovascular: EF 55-60%.No LA or LAA thrombus. Normal Peal PAP ~33 mmHg.   TOTAL KNEE ARTHROPLASTY  05/09/2012   Procedure: TOTAL KNEE ARTHROPLASTY;  Surgeon: Kerin Salen, MD;  Location: [facility];  Service: Orthopedics;  Laterality: Right;   TRANSTHORACIC ECHOCARDIOGRAM  06/2015   EF 60-65%. Mild LA dilation. Normal PA pressures: 32 mmHg. Aortic sclerosis but no stenosis.   wisdom teeth extraccted      Allergies  Allergen Reactions   Crab [Shellfish Allergy] Rash   Hydrocodone Hives   Adhesive [Tape] Rash   Oxycodone Rash    Allergies as of 05/17/2021       Reactions   Crab [shellfish Allergy] Rash   Hydrocodone Hives   Adhesive [tape] Rash   Oxycodone Rash        Medication List        Accurate as of May 17, 2021 11:59 PM. If you have any questions, ask your nurse or doctor.          acetaminophen 325 MG tablet Commonly known as: TYLENOL Take 650 mg by mouth every 6 (six) hours as needed.   ALPRAZolam 0.5 MG tablet Commonly known as: XANAX Take 0.5 mg by mouth as needed for anxiety. Every 6 Hours   atorvastatin 20 MG tablet Commonly known as: LIPITOR Take 20 mg by mouth daily.   Biofreeze 4 % Gel Generic drug: Menthol (Topical Analgesic) Apply 1 application topically in the morning, at noon, in the evening, and at bedtime. Apply to right great toe   diltiazem 240 MG 24 hr capsule Commonly known as: DILACOR XR Take 240 mg by mouth daily. For heart failure    Eliquis 5 MG Tabs tablet Generic drug: apixaban TAKE 1 TABLET  TWICE A DAY (SCHEDULE FOLLOW UP WITH CARDIOLOGIST PRIOR TO NEXT REFILL REQUEST)   escitalopram 10 MG tablet Commonly known as: LEXAPRO Take 10 mg by mouth daily.   esomeprazole 40 MG capsule Commonly known as: NEXIUM TAKE 1 CAPSULE DAILY   furosemide 40 MG tablet Commonly known as: LASIX Take 40 mg by mouth daily.   losartan 25 MG tablet Commonly known as: COZAAR Take 25 mg by mouth daily.   mirabegron ER 25 MG Tb24 tablet Commonly known as: MYRBETRIQ Take 25 mg by mouth daily.   MIRALAX PO Take 17 g by mouth daily.   Nitrostat 0.4 MG SL tablet Generic drug: nitroGLYCERIN USE AS NEEDED   PreviDent 5000 Booster Plus 1.1 % Pste Generic drug: Sodium Fluoride Place 1 application onto teeth at bedtime.   tamsulosin 0.4 MG Caps capsule Commonly known as: FLOMAX Take 0.4 mg by mouth daily.        Review of Systems  Constitutional:  Negative for appetite change, fatigue and fever.  HENT:  Positive for hearing loss. Negative for congestion and trouble swallowing.   Eyes:  Negative for visual disturbance.  Respiratory:  Positive for shortness of breath. Negative for cough.        O2 dependent. DOE  Cardiovascular:  Negative for leg swelling.  Gastrointestinal:  Negative for abdominal pain and constipation.       Incontinent of feces.   Genitourinary:  Positive for frequency. Negative for dysuria and urgency.       Incontinent of urine.   Musculoskeletal:  Positive for arthralgias and gait problem.       Left knee pain is chronic.   Skin:  Negative for color change.  Neurological:  Negative for speech difficulty, weakness and headaches.       Memory lapses.   Psychiatric/Behavioral:  Negative for behavioral problems and sleep disturbance. The patient is nervous/anxious.    Immunization History  Administered Date(s) Administered   H1N1 04/13/2010   Influenza, High Dose Seasonal PF 02/12/2014,  03/29/2018, 01/05/2019   Influenza-Unspecified 03/06/2013, 02/12/2014, 03/06/2017, 03/17/2020, 03/24/2021   Moderna SARS-COV2 Booster Vaccination 11/03/2020   Moderna Sars-Covid-2 Vaccination 06/08/2019, 07/06/2019, 04/14/2020   PFIZER(Purple Top)SARS-COV-2 Vaccination 02/23/2021   Pneumococcal Conjugate-13 02/12/2014   Pneumococcal Polysaccharide-23 02/04/2013, 02/12/2014   Td 09/04/2005   Tdap 12/11/2012   Zoster, Live 07/07/2006   Pertinent  Health Maintenance Due  Topic Date Due   INFLUENZA VACCINE  Completed   Fall Risk 01/30/2019  Patient Fall Risk Level Low fall risk   Functional Status Survey:    Vitals:   05/17/21 0904  BP: 118/65  Pulse: 65  Resp: 18  Temp: (!) 97.4 F (36.3 C)  SpO2: 94%  Weight: 211 lb 9.6 oz (96 kg)  Height: 5\' 7"  (1.702 m)   Body mass index is 33.14 kg/m. Physical Exam Vitals and nursing note reviewed.  Constitutional:      Appearance: Normal appearance.  HENT:     Head: Normocephalic and atraumatic.  Eyes:     Extraocular Movements: Extraocular movements intact.     Conjunctiva/sclera: Conjunctivae normal.     Pupils: Pupils are equal, round, and reactive to light.  Cardiovascular:     Rate and Rhythm: Normal rate. Rhythm irregular.     Heart sounds: No murmur heard. Pulmonary:     Breath sounds: No wheezing.     Comments: O2 dependent.  Abdominal:     General: Bowel sounds are normal.     Palpations: Abdomen is soft.  Tenderness: There is no abdominal tenderness.  Musculoskeletal:     Cervical back: Normal range of motion and neck supple.     Right lower leg: No edema.     Left lower leg: No edema.     Comments: Trace edema BLE., less than prior, near none.  Mechanical lift for transfer.   Skin:    General: Skin is warm and dry.  Neurological:     General: No focal deficit present.     Mental Status: He is alert and oriented to person, place, and time. Mental status is at baseline.     Gait: Gait abnormal.   Psychiatric:        Mood and Affect: Mood normal.        Behavior: Behavior normal.        Thought Content: Thought content normal.    Labs reviewed: Recent Labs    06/18/20 0000 07/23/20 0000 11/11/20 0000 04/20/21 0000  NA 139  --  140 138  K 4.0  --  4.0 4.2  CL 101  --  101 101  CO2 29*  --  27* 32*  BUN 20  --  20 19  CREATININE 1.0  --  1.0 0.9  CALCIUM 8.9  --  8.9 8.7  MG  --  1.6  --   --    Recent Labs    06/18/20 0000 11/11/20 0000 04/20/21 0000  AST 10* 9* 9*  ALT 4* 4* 3*  ALKPHOS 65 63 62  ALBUMIN 3.6 3.5 3.5   Recent Labs    06/18/20 0000 11/11/20 0000 01/12/21 0000  WBC 9.5 7.3 9.0  NEUTROABS 6,755.00 4,358.00 6,408.00  HGB 13.4* 14.2 14.3  HCT 39* 42 41  PLT 234 205 205   Lab Results  Component Value Date   TSH 3.33 11/11/2020   No results found for: HGBA1C Lab Results  Component Value Date   CHOL 116 11/11/2020   HDL 37 11/11/2020   LDLCALC 63 11/11/2020   TRIG 84 11/11/2020   CHOLHDL 2.9 02/12/2014    Significant Diagnostic Results in last 30 days:  No results found.  Assessment/Plan Essential hypertension Blood pressure is controlled, on Losartan, Diltiazem, Furosemide 40mg  qd.  Bun/creat 19/0.85 04/20/21  OSA (obstructive sleep apnea)  uses CPAP  Hyperlipidemia with target LDL less than 70 takes Atorvastatin, LDL 63 11/11/20   Iron deficiency anemia stable, off Fe. Hgb 14.3 01/12/21  GERD (gastroesophageal reflux disease) stable, on Esomeprazole  Persistent atrial fibrillation ([place]):  CHA2DS2-VASc Score 4; On Eliquis heart rate is in control, continue  Diltiazem 240mg  qd, Eliquis 5mg  bid. TSH 3.33 11/11/20  Anxiety stable, on Lexapro. prn Alprazolam  Diastolic CHF (HCC) CHF/minimal edema BLE, on Furosamide. Bun/creat 19/0.8511/15/22   ILD (interstitial lung disease) (HCC) Hx of Interstitial lung disease, O2 dependent     Osteoarthritis of right knee uses mechanical lift for transfer, takes prn  Tylenol.  Urinary incontinence no urinary retention, on Mirabegron, Tamsulosin     Family/ staff Communication: plan of care reviewed with the patient and charge nurse.   Labs/tests ordered:  none  Time spend 35 minutes.

## 2021-05-18 ENCOUNTER — Encounter: Payer: Self-pay | Admitting: Nurse Practitioner

## 2021-05-18 NOTE — Assessment & Plan Note (Signed)
uses CPAP 

## 2021-05-18 NOTE — Assessment & Plan Note (Signed)
stable, off Fe. Hgb 14.3 01/12/21 

## 2021-05-18 NOTE — Assessment & Plan Note (Signed)
heart rate is in control, continue  Diltiazem 240mg  qd, Eliquis 5mg  bid. TSH 3.33 11/11/20

## 2021-05-18 NOTE — Assessment & Plan Note (Signed)
CHF/minimal edema BLE, on Furosamide. Bun/creat 19/0.8511/15/22

## 2021-05-18 NOTE — Assessment & Plan Note (Signed)
no urinary retention, on Mirabegron, Tamsulosin  

## 2021-05-18 NOTE — Assessment & Plan Note (Signed)
Hx of Interstitial lung disease, O2 dependent

## 2021-05-18 NOTE — Assessment & Plan Note (Addendum)
takes Atorvastatin, LDL 63 11/11/20

## 2021-05-18 NOTE — Assessment & Plan Note (Signed)
Blood pressure is controlled, on Losartan, Diltiazem, Furosemide 40mg  qd.  Bun/creat 19/0.85 04/20/21

## 2021-05-18 NOTE — Assessment & Plan Note (Signed)
stable, on Esomeprazole 

## 2021-05-18 NOTE — Assessment & Plan Note (Signed)
uses mechanical lift for transfer, takes prn Tylenol. 

## 2021-05-18 NOTE — Assessment & Plan Note (Signed)
stable, on Lexapro. prn Alprazolam

## 2021-06-29 ENCOUNTER — Non-Acute Institutional Stay (SKILLED_NURSING_FACILITY): Payer: Medicare Other | Admitting: Internal Medicine

## 2021-06-29 ENCOUNTER — Encounter: Payer: Self-pay | Admitting: Internal Medicine

## 2021-06-29 DIAGNOSIS — I4819 Other persistent atrial fibrillation: Secondary | ICD-10-CM

## 2021-06-29 DIAGNOSIS — N3942 Incontinence without sensory awareness: Secondary | ICD-10-CM

## 2021-06-29 DIAGNOSIS — I5030 Unspecified diastolic (congestive) heart failure: Secondary | ICD-10-CM

## 2021-06-29 DIAGNOSIS — J849 Interstitial pulmonary disease, unspecified: Secondary | ICD-10-CM

## 2021-06-29 DIAGNOSIS — E785 Hyperlipidemia, unspecified: Secondary | ICD-10-CM

## 2021-06-29 DIAGNOSIS — G4733 Obstructive sleep apnea (adult) (pediatric): Secondary | ICD-10-CM

## 2021-06-29 DIAGNOSIS — I10 Essential (primary) hypertension: Secondary | ICD-10-CM

## 2021-06-29 DIAGNOSIS — D509 Iron deficiency anemia, unspecified: Secondary | ICD-10-CM | POA: Diagnosis not present

## 2021-06-29 DIAGNOSIS — M1711 Unilateral primary osteoarthritis, right knee: Secondary | ICD-10-CM

## 2021-06-29 DIAGNOSIS — K219 Gastro-esophageal reflux disease without esophagitis: Secondary | ICD-10-CM

## 2021-06-29 NOTE — Progress Notes (Signed)
Location:   [facility] Room Number: 5 Place of Service:  SNF [phone]) Provider:  Veleta Miners MD  Virgie Dad, MD  Patient Care Team: Virgie Dad, MD as PCP - General (Internal Medicine) Leonie Man, MD as PCP - Cardiology (Cardiology)  Extended Emergency Contact Information Primary Emergency Contact: [facility] Address: [address]          [place], [place] [address] [name] of [place] Phone: [phone] Mobile Phone: [phone] Relation: Spouse Secondary Emergency Contact: Janey Genta Address: [address]          [place], [place] of [place] Phone: [phone] Mobile Phone: [phone] Relation: Daughter  Code Status:  DNR Managed Care Goals of care: Advanced Directive information Advanced Directives 06/29/2021  Does Patient Have a Medical Advance Directive? Yes  Type of Paramedic of [place];Living will  Does patient want to make changes to medical advance directive? No - Patient declined  Copy of [place] in Chart? Yes - validated most recent copy scanned in chart (See row information)  Would patient like information on creating a medical advance directive? -  Pre-existing out of facility DNR order (yellow form or pink MOST form) -     Chief Complaint  Patient presents with   Medical Management of Chronic Issues   Quality Metric Gaps    Zoster Vaccines- Shingrix (1 of 2)   COVID-19 Vaccine     HPI:  Pt is a 88 y.o. male seen today for medical management of chronic diseases.    Long term resident of SNF   Patient has a history of CAD s/p PTCA, chronic atrial fibrillation on Eliquis,  chronic diastolic CHF,Interstitial lung disease with hypoxia on chronic oxygen,  OSA on CPAP doesn't use it  chronic back pain with spinal stenosis, S C-spine kyphosis,  cognitive impairment H/O Rosacea Anxiety and Depression  Patient continues to be  nonambulatory.  Very sedentary.  Stays in his room. He is stable. No new Nursing issues. No Behavior issues No Falls Wt Readings from Last 3 Encounters:  06/29/21 210 lb (95.3 kg)  05/17/21 211 lb 9.6 oz (96 kg)  04/27/21 211 lb 4.8 oz (95.8 kg)   Past Medical History:  Diagnosis Date   Anxiety    CAD S/P percutaneous coronary angioplasty 1996, 2009, 01/2010   PTCA of L Cx 1996; BMS-RCA [facility]) 2009; Staged PCI RCA (70% ISR) --> 3.0 mm x 23 mm BMS, staged PCI Diag - 2.0 mm x 12 mm Mini Vision BMS (2.25 mm)   Cataracts, bilateral    immature   Chronic back pain    scoliosis--pt states unable to have surgery bc of age   GERD (gastroesophageal reflux disease)    takes Nexium daily   History of colon polyps    Hyperlipidemia    Hypertension    Myocardial infarction ([place]) 1996, 2009   x 2;last time was about 8-73yrs ago    Obesity (BMI 30.0-34.9)    OSA (obstructive sleep apnea)    Osteoarthritis of both knees    Peripheral edema    Venous Stasis   Persistent atrial fibrillation ([place])    Unable to maintaine NSR after DCCV with Tikosyn.  Plan = rate control w/diltiazem (beta blocker off b/c fatigue). CHA2DS2Vasc 4. DOAC - Eliquis.    Prostate cancer ([place]) 2005   seed implant   Past Surgical History:  Procedure Laterality Date   CARDIOVERSION N/A 06/12/2015  Procedure: CARDIOVERSION;  Surgeon: Sanda Klein, MD;  Location: [facility] ENDOSCOPY;  Service: Cardiovascular;  Laterality: N/A;   CARDIOVERSION N/A 08/02/2015   Procedure: CARDIOVERSION;  Surgeon: [name], MD;  Location: [facility];  Service: Cardiovascular;  Laterality: N/A;   [place], 2009, August 2011   PTCA of L Cx 1996; BMS-RCA [facility]) 2009; Staged PCI RCA (70% ISR) --> 3.0 mm x 23 mm BMS, staged PCI Diag - 2.0 mm x 12 mm Mini Vision BMS (2.25 mm)   cyst removed  in college   NEUROPLASTY / TRANSPOSITION MEDIAN NERVE AT CARPAL TUNNEL BILATERAL     NM  MYOVIEW LTD  November 2013   EF 53%; fixed mid inferior-inferolateral defect, infarct with no ischemia.   right knee arthrsoscopy  68yrs ago   right trigger finger     seeds placed into prostate   2005   TEE WITHOUT CARDIOVERSION N/A 06/12/2015   Procedure: TRANSESOPHAGEAL ECHOCARDIOGRAM (TEE) - WITH CARDIOVERSION;  Surgeon: Sanda Klein, MD;  Location: [facility];  Service: Cardiovascular: EF 55-60%.No LA or LAA thrombus. Normal Peal PAP ~33 mmHg.   TOTAL KNEE ARTHROPLASTY  05/09/2012   Procedure: TOTAL KNEE ARTHROPLASTY;  Surgeon: Kerin Salen, MD;  Location: [facility];  Service: Orthopedics;  Laterality: Right;   TRANSTHORACIC ECHOCARDIOGRAM  06/2015   EF 60-65%. Mild LA dilation. Normal PA pressures: 32 mmHg. Aortic sclerosis but no stenosis.   wisdom teeth extraccted      Allergies  Allergen Reactions   Crab [Shellfish Allergy] Rash   Hydrocodone Hives   Adhesive [Tape] Rash   Oxycodone Rash    Allergies as of 06/29/2021       Reactions   Crab [shellfish Allergy] Rash   Hydrocodone Hives   Adhesive [tape] Rash   Oxycodone Rash        Medication List        Accurate as of June 29, 2021 10:51 AM. If you have any questions, ask your nurse or doctor.          STOP taking these medications    ALPRAZolam 0.5 MG tablet Commonly known as: XANAX Stopped by: Virgie Dad, MD       TAKE these medications    acetaminophen 325 MG tablet Commonly known as: TYLENOL Take 650 mg by mouth every 6 (six) hours as needed.   atorvastatin 20 MG tablet Commonly known as: LIPITOR Take 20 mg by mouth daily.   Biofreeze 4 % Gel Generic drug: Menthol (Topical Analgesic) Apply 1 application topically in the morning, at noon, in the evening, and at bedtime. Apply to right great toe   diltiazem 240 MG 24 hr capsule Commonly known as: DILACOR XR Take 240 mg by mouth daily. For heart failure   Eliquis 5 MG Tabs tablet Generic drug: apixaban TAKE 1 TABLET TWICE A DAY  (SCHEDULE FOLLOW UP WITH CARDIOLOGIST PRIOR TO NEXT REFILL REQUEST)   escitalopram 10 MG tablet Commonly known as: LEXAPRO Take 10 mg by mouth daily.   esomeprazole 40 MG capsule Commonly known as: NEXIUM TAKE 1 CAPSULE DAILY   furosemide 40 MG tablet Commonly known as: LASIX Take 40 mg by mouth daily.   losartan 25 MG tablet Commonly known as: COZAAR Take 25 mg by mouth daily.   mirabegron ER 25 MG Tb24 tablet Commonly known as: MYRBETRIQ Take 25 mg by mouth daily.   MIRALAX PO Take 17 g by  mouth daily.   Nitrostat 0.4 MG SL tablet Generic drug: nitroGLYCERIN USE AS NEEDED   PreviDent 5000 Booster Plus 1.1 % Pste Generic drug: Sodium Fluoride Place 1 application onto teeth at bedtime.   tamsulosin 0.4 MG Caps capsule Commonly known as: FLOMAX Take 0.4 mg by mouth daily.        Review of Systems  Constitutional:  Negative for activity change, appetite change and unexpected weight change.  HENT: Negative.    Respiratory:  Negative for cough and shortness of breath.   Cardiovascular:  Negative for leg swelling.  Gastrointestinal:  Negative for constipation.  Genitourinary:  Negative for frequency.  Musculoskeletal:  Positive for gait problem. Negative for arthralgias and myalgias.  Skin: Negative.  Negative for rash.  Neurological:  Negative for dizziness and weakness.  Psychiatric/Behavioral:  Positive for dysphoric mood. Negative for confusion and sleep disturbance.   All other systems reviewed and are negative.  Immunization History  Administered Date(s) Administered   H1N1 04/13/2010   Influenza, High Dose Seasonal PF 02/12/2014, 03/29/2018, 01/05/2019   Influenza-Unspecified 03/06/2013, 02/12/2014, 03/06/2017, 03/17/2020, 03/24/2021   Moderna SARS-COV2 Booster Vaccination 11/03/2020   Moderna Sars-Covid-2 Vaccination 06/08/2019, 07/06/2019, 04/14/2020   PFIZER(Purple Top)SARS-COV-2 Vaccination 02/23/2021   Pneumococcal Conjugate-13 02/12/2014    Pneumococcal Polysaccharide-23 02/04/2013, 02/12/2014   Td 09/04/2005   Tdap 12/11/2012   Zoster, Live 07/07/2006   Pertinent  Health Maintenance Due  Topic Date Due   INFLUENZA VACCINE  Completed   Fall Risk 01/30/2019  Patient Fall Risk Level Low fall risk   Functional Status Survey:    Vitals:   06/29/21 1047  BP: (!) 144/78  Pulse: 64  Resp: 18  Temp: (!) 97.2 F (36.2 C)  SpO2: 95%  Weight: 210 lb (95.3 kg)  Height: 5\' 7"  (1.702 m)   Body mass index is 32.89 kg/m. Physical Exam Vitals reviewed.  Constitutional:      Appearance: Normal appearance. He is obese.  HENT:     Head: Normocephalic.     Mouth/Throat:     Mouth: Mucous membranes are moist.     Pharynx: Oropharynx is clear.  Eyes:     Pupils: Pupils are equal, round, and reactive to light.  Cardiovascular:     Rate and Rhythm: Normal rate and regular rhythm.     Pulses: Normal pulses.     Heart sounds: No murmur heard. Pulmonary:     Effort: Pulmonary effort is normal. No respiratory distress.     Breath sounds: Normal breath sounds. No rales.  Abdominal:     General: Abdomen is flat. Bowel sounds are normal.     Palpations: Abdomen is soft.  Musculoskeletal:        General: No swelling.     Cervical back: Neck supple.  Skin:    General: Skin is warm.  Neurological:     General: No focal deficit present.     Mental Status: He is alert and oriented to person, place, and time.  Psychiatric:        Mood and Affect: Mood normal.        Thought Content: Thought content normal.    Labs reviewed: Recent Labs    07/23/20 0000 11/11/20 0000 04/20/21 0000  NA  --  140 138  K  --  4.0 4.2  CL  --  101 101  CO2  --  27* 32*  BUN  --  20 19  CREATININE  --  1.0 0.9  CALCIUM  --  8.9 8.7  MG 1.6  --   --    Recent Labs    11/11/20 0000 04/20/21 0000  AST 9* 9*  ALT 4* 3*  ALKPHOS 63 62  ALBUMIN 3.5 3.5   Recent Labs    11/11/20 0000 01/12/21 0000  WBC 7.3 9.0  NEUTROABS 4,358.00  6,408.00  HGB 14.2 14.3  HCT 42 41  PLT 205 205   Lab Results  Component Value Date   TSH 3.33 11/11/2020   No results found for: HGBA1C Lab Results  Component Value Date   CHOL 116 11/11/2020   HDL 37 11/11/2020   LDLCALC 63 11/11/2020   TRIG 84 11/11/2020   CHOLHDL 2.9 02/12/2014    Significant Diagnostic Results in last 30 days:  No results found.  Assessment/Plan 1. Essential hypertension Continue on Losartan and Cardizem  2. OSA (obstructive sleep apnea) Does not use CPAP  3. Hyperlipidemia with target LDL less than 70 On statin LDL less then 70  4. Iron deficiency anemia, unspecified iron deficiency anemia type Off iron now HGB was stable  5. Gastroesophageal reflux disease, unspecified whether esophagitis present On Nexium  6. Persistent atrial fibrillation ([place]):  CHA2DS2-VASc Score 4; On Eliquis Continue Eliquis and Cardizem  7. ILD (interstitial lung disease) (HCC) On Low dose of Oxygen No Further follow up with Pulmonary  8. Diastolic congestive heart failure, unspecified HF chronicity (HCC) On Lasix  9. Primary osteoarthritis of right knee Tylenol  10. Urinary incontinence without sensory awareness On Myrbetriq and Flomax 11 Anxiety and Depression On LExapro  Family/ staff Communication:   Labs/tests ordered:

## 2021-07-08 ENCOUNTER — Encounter: Payer: Self-pay | Admitting: Nurse Practitioner

## 2021-07-08 ENCOUNTER — Non-Acute Institutional Stay (SKILLED_NURSING_FACILITY): Payer: Medicare Other | Admitting: Nurse Practitioner

## 2021-07-08 DIAGNOSIS — K5900 Constipation, unspecified: Secondary | ICD-10-CM

## 2021-07-08 DIAGNOSIS — K219 Gastro-esophageal reflux disease without esophagitis: Secondary | ICD-10-CM | POA: Diagnosis not present

## 2021-07-08 DIAGNOSIS — G4733 Obstructive sleep apnea (adult) (pediatric): Secondary | ICD-10-CM

## 2021-07-08 DIAGNOSIS — I4819 Other persistent atrial fibrillation: Secondary | ICD-10-CM | POA: Diagnosis not present

## 2021-07-08 DIAGNOSIS — N3942 Incontinence without sensory awareness: Secondary | ICD-10-CM

## 2021-07-08 DIAGNOSIS — I10 Essential (primary) hypertension: Secondary | ICD-10-CM

## 2021-07-08 DIAGNOSIS — D509 Iron deficiency anemia, unspecified: Secondary | ICD-10-CM

## 2021-07-08 DIAGNOSIS — M1711 Unilateral primary osteoarthritis, right knee: Secondary | ICD-10-CM

## 2021-07-08 DIAGNOSIS — R634 Abnormal weight loss: Secondary | ICD-10-CM

## 2021-07-08 DIAGNOSIS — E785 Hyperlipidemia, unspecified: Secondary | ICD-10-CM

## 2021-07-08 DIAGNOSIS — F419 Anxiety disorder, unspecified: Secondary | ICD-10-CM

## 2021-07-08 DIAGNOSIS — I5030 Unspecified diastolic (congestive) heart failure: Secondary | ICD-10-CM

## 2021-07-08 DIAGNOSIS — J849 Interstitial pulmonary disease, unspecified: Secondary | ICD-10-CM

## 2021-07-08 NOTE — Assessment & Plan Note (Signed)
Blood pressure is controlled, on Losartan, Diltiazem, Furosemide 40mg  qd.  Bun/creat 19/0.85 04/20/21

## 2021-07-08 NOTE — Progress Notes (Signed)
Location:   [facility] Room Number: [place] of Service:  SNF (31) Provider:  Burdell Peed, NP    Patient Care Team: Virgie Dad, MD as PCP - General (Internal Medicine) Leonie Akisha Sturgill, MD as PCP - Cardiology (Cardiology)  Extended Emergency Contact Information Primary Emergency Contact: [facility] Address: [address]          [place], [place] [address] [name] of [place] Phone: [phone] Mobile Phone: [phone] Relation: Spouse Secondary Emergency Contact: Janey Genta Address: [address]          [place], [place] of [place] Phone: [phone] Mobile Phone: [phone] Relation: Daughter  Code Status:  DNR Goals of care: Advanced Directive information Advanced Directives 07/08/2021  Does Patient Have a Medical Advance Directive? Yes  Type of Paramedic of [place];Living will;Out of facility DNR (pink MOST or yellow form)  Does patient want to make changes to medical advance directive? No - Patient declined  Copy of [place] in Chart? Yes - validated most recent copy scanned in chart (See row information)  Would patient like information on creating a medical advance directive? -  Pre-existing out of facility DNR order (yellow form or pink MOST form) -     Chief Complaint  Patient presents with   Medical Management of Chronic Issues    Routine Visit.   Immunizations    Discuss the need for Shingrix vaccine, and Covid Booster.    HPI:  Pt is a 88 y.o. male seen today for medical management of chronic diseases.     Urinary incontinent, no urinary retention, on Mirabegron, Tamsulosin              Constipation, stable, on MiraLax qd.              OA, uses mechanical lift for transfer, takes prn Tylenol.             Hx of Interstitial lung disease, O2 dependent, no further f/u Pulmonology             CHF/minimal edema BLE, on Furosamide. Bun/creat  19/0.8511/15/22           Anxiety/depression, stable, on Lexapro.              Afib, heart rate is in control, on Diltiazem 240mg  qd, Eliquis 5mg  bid. TSH 3.33 11/11/20             GERD, stable, on Esomeprazole             Anemia, stable, off Fe. Hgb 14.3 01/12/21             HTN, on Losartan, Diltiazem, Furosemide 40mg  qd.  Bun/creat 19/0.85 04/20/21             OSA uses CPAP             Hyperlipidemia, takes Atorvastatin, LDL 63 11/11/20  Past Medical History:  Diagnosis Date   Anxiety    CAD S/P percutaneous coronary angioplasty 1996, 2009, 01/2010   PTCA of L Cx 1996; BMS-RCA [facility]) 2009; Staged PCI RCA (70% ISR) --> 3.0 mm x 23 mm BMS, staged PCI Diag - 2.0 mm x 12 mm Mini Vision BMS (2.25 mm)   Cataracts, bilateral    immature   Chronic back pain    scoliosis--pt states unable to have surgery bc of age   GERD (gastroesophageal reflux disease)    takes Nexium daily  History of colon polyps    Hyperlipidemia    Hypertension    Myocardial infarction ([place]) 1996, 2009   x 2;last time was about 8-35yrs ago    Obesity (BMI 30.0-34.9)    OSA (obstructive sleep apnea)    Osteoarthritis of both knees    Peripheral edema    Venous Stasis   Persistent atrial fibrillation ([place])    Unable to maintaine NSR after DCCV with Tikosyn.  Plan = rate control w/diltiazem (beta blocker off b/c fatigue). CHA2DS2Vasc 4. DOAC - Eliquis.    Prostate cancer ([place]) 2005   seed implant   Past Surgical History:  Procedure Laterality Date   CARDIOVERSION N/A 06/12/2015   Procedure: CARDIOVERSION;  Surgeon: Sanda Klein, MD;  Location: [facility] ENDOSCOPY;  Service: Cardiovascular;  Laterality: N/A;   CARDIOVERSION N/A 08/02/2015   Procedure: CARDIOVERSION;  Surgeon: [name], MD;  Location: [facility];  Service: Cardiovascular;  Laterality: N/A;   [place], 2009, August 2011   PTCA of L Cx 1996; BMS-RCA [facility]) 2009; Staged PCI RCA (70%  ISR) --> 3.0 mm x 23 mm BMS, staged PCI Diag - 2.0 mm x 12 mm Mini Vision BMS (2.25 mm)   cyst removed  in college   NEUROPLASTY / TRANSPOSITION MEDIAN NERVE AT CARPAL TUNNEL BILATERAL     NM MYOVIEW LTD  November 2013   EF 53%; fixed mid inferior-inferolateral defect, infarct with no ischemia.   right knee arthrsoscopy  83yrs ago   right trigger finger     seeds placed into prostate   2005   TEE WITHOUT CARDIOVERSION N/A 06/12/2015   Procedure: TRANSESOPHAGEAL ECHOCARDIOGRAM (TEE) - WITH CARDIOVERSION;  Surgeon: Sanda Klein, MD;  Location: [facility];  Service: Cardiovascular: EF 55-60%.No LA or LAA thrombus. Normal Peal PAP ~33 mmHg.   TOTAL KNEE ARTHROPLASTY  05/09/2012   Procedure: TOTAL KNEE ARTHROPLASTY;  Surgeon: Kerin Salen, MD;  Location: [facility];  Service: Orthopedics;  Laterality: Right;   TRANSTHORACIC ECHOCARDIOGRAM  06/2015   EF 60-65%. Mild LA dilation. Normal PA pressures: 32 mmHg. Aortic sclerosis but no stenosis.   wisdom teeth extraccted      Allergies  Allergen Reactions   Crab [Shellfish Allergy] Rash   Codeine    Hydrocodone Hives   Adhesive [Tape] Rash   Oxycodone Rash    Allergies as of 07/08/2021       Reactions   Crab [shellfish Allergy] Rash   Codeine    Hydrocodone Hives   Adhesive [tape] Rash   Oxycodone Rash        Medication List        Accurate as of July 08, 2021 11:59 PM. If you have any questions, ask your nurse or doctor.          acetaminophen 325 MG tablet Commonly known as: TYLENOL Take 650 mg by mouth every 6 (six) hours as needed.   atorvastatin 20 MG tablet Commonly known as: LIPITOR Take 20 mg by mouth daily.   Biofreeze 4 % Gel Generic drug: Menthol (Topical Analgesic) Apply 1 application topically in the morning, at noon, in the evening, and at bedtime. Apply to right great toe   diltiazem 240 MG 24 hr capsule Commonly known as: DILACOR XR Take 240 mg by mouth daily. For heart failure   Eliquis 5 MG Tabs  tablet Generic drug: apixaban TAKE 1 TABLET TWICE A DAY (SCHEDULE FOLLOW UP WITH  CARDIOLOGIST PRIOR TO NEXT REFILL REQUEST)   escitalopram 10 MG tablet Commonly known as: LEXAPRO Take 10 mg by mouth daily.   esomeprazole 40 MG capsule Commonly known as: NEXIUM TAKE 1 CAPSULE DAILY   furosemide 40 MG tablet Commonly known as: LASIX Take 40 mg by mouth daily.   losartan 25 MG tablet Commonly known as: COZAAR Take 25 mg by mouth daily.   mirabegron ER 25 MG Tb24 tablet Commonly known as: MYRBETRIQ Take 25 mg by mouth daily.   MIRALAX PO Take 17 g by mouth daily.   nitroGLYCERIN 0.4 MG SL tablet Commonly known as: NITROSTAT Place 0.4 mg under the tongue as needed for chest pain.   PreviDent 5000 Booster Plus 1.1 % Pste Generic drug: Sodium Fluoride Place 1 application onto teeth at bedtime.   tamsulosin 0.4 MG Caps capsule Commonly known as: FLOMAX Take 0.4 mg by mouth daily.        Review of Systems  Constitutional:  Positive for unexpected weight change. Negative for fatigue and fever.       Gradual weight loss #3-4Ibs last month, dietary f/u.   HENT:  Positive for hearing loss. Negative for congestion and trouble swallowing.   Eyes:  Negative for visual disturbance.  Respiratory:  Positive for shortness of breath. Negative for cough.        O2 dependent. DOE  Cardiovascular:  Positive for leg swelling.  Gastrointestinal:  Negative for abdominal pain and constipation.       Incontinent of feces.   Genitourinary:  Positive for frequency. Negative for dysuria and urgency.       Incontinent of urine.   Musculoskeletal:  Positive for arthralgias and gait problem.       Left knee pain is chronic.   Skin:  Negative for color change.  Neurological:  Negative for speech difficulty, weakness and headaches.       Memory lapses.   Psychiatric/Behavioral:  Negative for behavioral problems and sleep disturbance. The patient is not nervous/anxious.    Immunization  History  Administered Date(s) Administered   H1N1 04/13/2010   Influenza, High Dose Seasonal PF 02/12/2014, 03/29/2018, 01/05/2019   Influenza-Unspecified 03/06/2013, 02/12/2014, 03/06/2017, 03/17/2020, 03/24/2021   Moderna SARS-COV2 Booster Vaccination 11/03/2020   Moderna Sars-Covid-2 Vaccination 06/08/2019, 07/06/2019, 04/14/2020   PFIZER(Purple Top)SARS-COV-2 Vaccination 02/23/2021   Pneumococcal Conjugate-13 02/12/2014   Pneumococcal Polysaccharide-23 02/04/2013, 02/12/2014   Td 09/04/2005   Tdap 12/11/2012   Zoster, Live 07/07/2006   Pertinent  Health Maintenance Due  Topic Date Due   INFLUENZA VACCINE  Completed   Fall Risk 01/30/2019  Patient Fall Risk Level Low fall risk   Functional Status Survey:    Vitals:   07/08/21 1113  BP: (!) 145/69  Pulse: 76  Resp: 20  Temp: (!) 97.2 F (36.2 C)  SpO2: 94%  Weight: 207 lb 6.4 oz (94.1 kg)  Height: 5\' 7"  (1.702 m)   Body mass index is 32.48 kg/m. Physical Exam Vitals and nursing note reviewed.  Constitutional:      Appearance: Normal appearance.  HENT:     Head: Normocephalic and atraumatic.  Eyes:     Extraocular Movements: Extraocular movements intact.     Conjunctiva/sclera: Conjunctivae normal.     Pupils: Pupils are equal, round, and reactive to light.  Cardiovascular:     Rate and Rhythm: Normal rate. Rhythm irregular.     Heart sounds: No murmur heard. Pulmonary:     Breath sounds: No wheezing.     Comments:  O2 dependent.  Abdominal:     General: Bowel sounds are normal.     Palpations: Abdomen is soft.     Tenderness: There is no abdominal tenderness.  Musculoskeletal:     Cervical back: Normal range of motion and neck supple.     Right lower leg: No edema.     Left lower leg: No edema.     Comments: Trace edema BLE., less than prior, near none.  Mechanical lift for transfer.   Skin:    General: Skin is warm and dry.  Neurological:     General: No focal deficit present.     Mental Status: He  is alert and oriented to person, place, and time. Mental status is at baseline.     Gait: Gait abnormal.  Psychiatric:        Mood and Affect: Mood normal.        Behavior: Behavior normal.        Thought Content: Thought content normal.    Labs reviewed: Recent Labs    07/23/20 0000 11/11/20 0000 04/20/21 0000  NA  --  140 138  K  --  4.0 4.2  CL  --  101 101  CO2  --  27* 32*  BUN  --  20 19  CREATININE  --  1.0 0.9  CALCIUM  --  8.9 8.7  MG 1.6  --   --    Recent Labs    11/11/20 0000 04/20/21 0000  AST 9* 9*  ALT 4* 3*  ALKPHOS 63 62  ALBUMIN 3.5 3.5   Recent Labs    11/11/20 0000 01/12/21 0000  WBC 7.3 9.0  NEUTROABS 4,358.00 6,408.00  HGB 14.2 14.3  HCT 42 41  PLT 205 205   Lab Results  Component Value Date   TSH 3.33 11/11/2020   No results found for: HGBA1C Lab Results  Component Value Date   CHOL 116 11/11/2020   HDL 37 11/11/2020   LDLCALC 63 11/11/2020   TRIG 84 11/11/2020   CHOLHDL 2.9 02/12/2014    Significant Diagnostic Results in last 30 days:  No results found.  Assessment/Plan Essential hypertension Blood pressure is controlled, on Losartan, Diltiazem, Furosemide 40mg  qd.  Bun/creat 19/0.85 04/20/21  Persistent atrial fibrillation ([place]):  CHA2DS2-VASc Score 4; On Eliquis heart rate is in control, on Diltiazem 240mg  qd, Eliquis 5mg  bid. TSH 3.33 11/11/20  GERD (gastroesophageal reflux disease) stable, on Esomeprazole  Iron deficiency anemia stable, off Fe. Hgb 14.3 01/12/21  OSA (obstructive sleep apnea) uses CPAP  Hyperlipidemia with target LDL less than 70  takes Atorvastatin, LDL 63 11/11/20  Anxiety stable, on Lexapro.   Diastolic CHF ([place]) minimal edema BLE, on Furosamide. Bun/creat 19/0.8511/15/22          ILD (interstitial lung disease) ([place]) O2 dependent, no further f/u Pulmonology  Osteoarthritis of right knee uses mechanical lift for transfer, takes prn Tylenol.  Constipation stable, on MiraLax qd.    Urinary incontinence no urinary retention, on Mirabegron, Tamsulosin   Weight loss #3-4Ibs weight loss in the past 4-6 weeks, the patient denied changing of his appetite or Gi Symptoms, will f/u dietary, may consider labs if persists.      Family/ staff Communication: plan of care reviewed with the patient and charge nurse.   Labs/tests ordered:  none  Time spend 35 minutes.

## 2021-07-08 NOTE — Assessment & Plan Note (Signed)
minimal edema BLE, on Furosamide. Bun/creat 19/0.8511/15/22

## 2021-07-08 NOTE — Assessment & Plan Note (Signed)
heart rate is in control, on Diltiazem 240mg  qd, Eliquis 5mg  bid. TSH 3.33 11/11/20

## 2021-07-08 NOTE — Assessment & Plan Note (Signed)
uses CPAP 

## 2021-07-08 NOTE — Assessment & Plan Note (Signed)
stable, off Fe. Hgb 14.3 01/12/21 

## 2021-07-08 NOTE — Assessment & Plan Note (Signed)
stable, on Esomeprazole 

## 2021-07-08 NOTE — Assessment & Plan Note (Signed)
O2 dependent, no further f/u Pulmonology 

## 2021-07-08 NOTE — Assessment & Plan Note (Signed)
stable, on MiraLax qd. 

## 2021-07-08 NOTE — Assessment & Plan Note (Signed)
uses mechanical lift for transfer, takes prn Tylenol. 

## 2021-07-08 NOTE — Assessment & Plan Note (Signed)
stable, on Lexapro.  

## 2021-07-08 NOTE — Assessment & Plan Note (Signed)
no urinary retention, on Mirabegron, Tamsulosin  

## 2021-07-08 NOTE — Assessment & Plan Note (Signed)
takes Atorvastatin, LDL 63 11/11/20

## 2021-07-09 DIAGNOSIS — R634 Abnormal weight loss: Secondary | ICD-10-CM | POA: Insufficient documentation

## 2021-07-09 NOTE — Assessment & Plan Note (Signed)
#  3-4Ibs weight loss in the past 4-6 weeks, the patient denied changing of his appetite or Gi Symptoms, will f/u dietary, may consider labs if persists.

## 2021-08-24 ENCOUNTER — Encounter: Payer: Self-pay | Admitting: Nurse Practitioner

## 2021-08-24 ENCOUNTER — Non-Acute Institutional Stay (SKILLED_NURSING_FACILITY): Payer: Medicare Other | Admitting: Nurse Practitioner

## 2021-08-24 DIAGNOSIS — Z0000 Encounter for general adult medical examination without abnormal findings: Secondary | ICD-10-CM | POA: Diagnosis not present

## 2021-08-26 ENCOUNTER — Encounter: Payer: Self-pay | Admitting: Nurse Practitioner

## 2021-08-26 NOTE — Progress Notes (Addendum)
Subjective:   Jerry [name] is a 88 y.o. male who presents for Medicare Annual/Subsequent preventive examination at [facility] Cardiac Risk Factors include: advanced age (>86men, >50 women);dyslipidemia;hypertension;male gender;obesity (BMI >30kg/m2);sedentary lifestyle     Objective:    Today's Vitals   08/24/21 1623 08/26/21 1633  BP: (!) 142/78   Pulse: 86   Resp: 18   Temp: 97.7 F (36.5 C)   SpO2: 94%   Weight: 204 lb 9.6 oz (92.8 kg)   Height: 5\' 7"  (1.702 m)   PainSc:  4    Body mass index is 32.04 kg/m.     08/27/2021    9:21 AM 08/24/2021    4:24 PM 07/08/2021   11:16 AM 06/29/2021   10:51 AM 05/17/2021    9:13 AM 04/23/2021    4:43 PM 04/15/2021    3:27 PM  Advanced Directives  Does Patient Have a Medical Advance Directive? Yes Yes Yes Yes Yes Yes Yes  Type of Estate agent of [place];Living will;Out of facility DNR (pink MOST or yellow form) Healthcare Power of [place];Living will;Out of facility DNR (pink MOST or yellow form) Healthcare Power of [place];Living will;Out of facility DNR (pink MOST or yellow form) Healthcare Power of [place];Living will Healthcare Power of [place];Living will Healthcare Power of [place];Living will Healthcare Power of [place];Living will  Does patient want to make changes to medical advance directive? No - Patient declined No - Patient declined No - Patient declined No - Patient declined No - Patient declined No - Patient declined No - Patient declined  Copy of Healthcare Power of Attorney in Chart? Yes - validated most recent copy scanned in chart (See row information) Yes - validated most recent copy scanned in chart (See row information) Yes - validated most recent copy scanned in chart (See row information) Yes - validated most recent copy scanned in chart (See row information) Yes - validated most recent copy scanned in chart (See row information) Yes - validated most recent copy scanned in chart (See  row information) Yes - validated most recent copy scanned in chart (See row information)  Pre-existing out of facility DNR order (yellow form or pink MOST form) Pink MOST form placed in chart (order not valid for inpatient use)          Current Medications (verified) Outpatient Encounter Medications as of 08/24/2021  Medication Sig   acetaminophen (TYLENOL) 325 MG tablet Take 650 mg by mouth every 6 (six) hours as needed.   atorvastatin (LIPITOR) 20 MG tablet Take 20 mg by mouth daily.   diltiazem (DILACOR XR) 240 MG 24 hr capsule Take 240 mg by mouth daily. For heart failure   ELIQUIS 5 MG TABS tablet TAKE 1 TABLET TWICE A DAY (SCHEDULE FOLLOW UP WITH CARDIOLOGIST PRIOR TO NEXT REFILL REQUEST)   escitalopram (LEXAPRO) 10 MG tablet Take 10 mg by mouth daily.   esomeprazole (NEXIUM) 40 MG capsule TAKE 1 CAPSULE DAILY   furosemide (LASIX) 40 MG tablet Take 40 mg by mouth daily.   losartan (COZAAR) 25 MG tablet Take 25 mg by mouth daily.   Menthol, Topical Analgesic, (BIOFREEZE) 4 % GEL Apply 1 application topically in the morning, at noon, in the evening, and at bedtime. Apply to right great toe   mirabegron ER (MYRBETRIQ) 25 MG TB24 tablet Take 25 mg by mouth daily.    nitroGLYCERIN (NITROSTAT) 0.4 MG SL tablet Place 0.4 mg under the tongue as needed for chest pain.   Polyethylene Glycol  3350 (MIRALAX PO) Take 17 g by mouth daily.   Sodium Fluoride (PREVIDENT 5000 BOOSTER PLUS) 1.1 % PSTE Place 1 application onto teeth at bedtime.   tamsulosin (FLOMAX) 0.4 MG CAPS capsule Take 0.4 mg by mouth daily.    No facility-administered encounter medications on file as of 08/24/2021.    Allergies (verified) Crab [shellfish allergy], Codeine, Hydrocodone, Adhesive [tape], and Oxycodone   History: Past Medical History:  Diagnosis Date   Anxiety    CAD S/P percutaneous coronary angioplasty 1996, 2009, 01/2010   PTCA of L Cx 1996; BMS-RCA [facility]) 2009; Staged PCI RCA (70% ISR) --> 3.0 mm x 23 mm BMS,  staged PCI Diag - 2.0 mm x 12 mm Mini Vision BMS (2.25 mm)   Cataracts, bilateral    immature   Chronic back pain    scoliosis--pt states unable to have surgery bc of age   GERD (gastroesophageal reflux disease)    takes Nexium daily   History of colon polyps    Hyperlipidemia    Hypertension    Myocardial infarction (HCC) 1996, 2009   x 2;last time was about 8-71yrs ago    Obesity (BMI 30.0-34.9)    OSA (obstructive sleep apnea)    Osteoarthritis of both knees    Peripheral edema    Venous Stasis   Persistent atrial fibrillation (HCC)    Unable to maintaine NSR after DCCV with Tikosyn.  Plan = rate control w/diltiazem (beta blocker off b/c fatigue). CHA2DS2Vasc 4. DOAC - Eliquis.    Prostate cancer (HCC) 2005   seed implant   Past Surgical History:  Procedure Laterality Date   CARDIOVERSION N/A 06/12/2015   Procedure: CARDIOVERSION;  Surgeon: Thurmon Fair, MD;  Location: [facility] ENDOSCOPY;  Service: Cardiovascular;  Laterality: N/A;   CARDIOVERSION N/A 08/02/2015   Procedure: CARDIOVERSION;  Surgeon: Hillis Range, MD;  Location: [facility] OR;  Service: Cardiovascular;  Laterality: N/A;   CHOLECYSTECTOMY     COLONOSCOPY     CORONARY ANGIOPLASTY WITH STENT PLACEMENT  1996, 2009, August 2011   PTCA of L Cx 1996; BMS-RCA [facility]) 2009; Staged PCI RCA (70% ISR) --> 3.0 mm x 23 mm BMS, staged PCI Diag - 2.0 mm x 12 mm Mini Vision BMS (2.25 mm)   cyst removed  in college   NEUROPLASTY / TRANSPOSITION MEDIAN NERVE AT CARPAL TUNNEL BILATERAL     NM MYOVIEW LTD  November 2013   EF 53%; fixed mid inferior-inferolateral defect, infarct with no ischemia.   right knee arthrsoscopy  38yrs ago   right trigger finger     seeds placed into prostate   2005   TEE WITHOUT CARDIOVERSION N/A 06/12/2015   Procedure: TRANSESOPHAGEAL ECHOCARDIOGRAM (TEE) - WITH CARDIOVERSION;  Surgeon: Thurmon Fair, MD;  Location: [facility] ENDOSCOPY;  Service: Cardiovascular: EF 55-60%.No LA or LAA thrombus. Normal Peal PAP ~33 mmHg.    TOTAL KNEE ARTHROPLASTY  05/09/2012   Procedure: TOTAL KNEE ARTHROPLASTY;  Surgeon: Nestor Lewandowsky, MD;  Location: [facility] OR;  Service: Orthopedics;  Laterality: Right;   TRANSTHORACIC ECHOCARDIOGRAM  06/2015   EF 60-65%. Mild LA dilation. Normal PA pressures: 32 mmHg. Aortic sclerosis but no stenosis.   wisdom teeth extraccted     Family History  Problem Relation Age of Onset   Cancer Mother 56       abdomen died in 51s   Heart attack Father 37       died in 48s   Social History   Socioeconomic History   Marital status: Married  Spouse name: Not on file   Number of children: Not on file   Years of education: Not on file   Highest education level: Not on file  Occupational History   Occupation: Retired  Tobacco Use   Smoking status: Former    Types: Pipe    Quit date: 03/12/1984    Years since quitting: 37.4   Smokeless tobacco: Never   Tobacco comments:    quit smoking pipe about 39yrs ago  Vaping Use   Vaping Use: Never used  Substance and Sexual Activity   Alcohol use: Yes    Comment: glass of wine daily   Drug use: No   Sexual activity: Not Currently  Other Topics Concern   Not on file  Social History Narrative   He is a ? WIDOWED father of 2, grandfather of 2.   hE LIVES @ [facility])   He does not smoke. Drinks occasional alcohol.    Social Determinants of Health   Financial Resource Strain: Not on file  Food Insecurity: Not on file  Transportation Needs: Not on file  Physical Activity: Not on file  Stress: Not on file  Social Connections: Not on file    Tobacco Counseling Counseling given: Not Answered Tobacco comments: quit smoking pipe about 63yrs ago   Clinical Intake:  Pre-visit preparation completed: Yes  Pain : 0-10 Pain Score: 4  Pain Type: Chronic pain Pain Location: Leg Pain Orientation: Right, Left Pain Descriptors / Indicators: Aching Pain Onset: More than a month ago Pain Frequency: Several days a  week Pain Relieving Factors: Tylenol Effect of Pain on Daily Activities: none  Pain Relieving Factors: Tylenol  BMI - recorded: 32.04 Nutritional Status: BMI > 30  Obese Nutritional Risks: None Diabetes: No  How often do you need to have someone help you when you read instructions, pamphlets, or other written materials from your doctor or pharmacy?: 1 - Never What is the last grade level you completed in school?: college  Diabetic?no  Interpreter Needed?: No  Information entered by :: Aaliyan Brinkmeier Nedra Hai NP   Activities of Daily Living    08/26/2021    4:36 PM  In your present state of health, do you have any difficulty performing the following activities:  Hearing? 1  Vision? 0  Difficulty concentrating or making decisions? 0  Walking or climbing stairs? 1  Dressing or bathing? 1  Doing errands, shopping? 1  Preparing Food and eating ? N  Using the Toilet? Y  In the past six months, have you accidently leaked urine? Y  Do you have problems with loss of bowel control? Y  Managing your Medications? Y  Managing your Finances? Y  Housekeeping or managing your Housekeeping? Y    Patient Care Team: Mahlon Gammon, MD as PCP - General (Internal Medicine) Marykay Lex, MD as PCP - Cardiology (Cardiology)  Indicate any recent Medical Services you may have received from other than [facility] providers in the past year (date may be approximate).     Assessment:   This is a routine wellness examination for Zariah.  Hearing/Vision screen No results found.  Dietary issues and exercise activities discussed: Current Exercise Habits: The patient does not participate in regular exercise at present, Exercise limited by: cardiac condition(s);neurologic condition(s);orthopedic condition(s);respiratory conditions(s)   Goals Addressed             This Visit's Progress    Maintain Mobility and Function  Evidence-based guidance:  Acknowledge and validate impact of pain, loss  of strength and potential disfigurement (hand osteoarthritis) on mental health and daily life, such as social isolation, anxiety, depression, impaired sexual relationship and   injury from falls.  Anticipate referral to physical or occupational therapy for assessment, therapeutic exercise and recommendation for adaptive equipment or assistive devices; encourage participation.  Assess impact on ability to perform activities of daily living, as well as engage in sports and leisure events or requirements of work or school.  Provide anticipatory guidance and reassurance about the benefit of exercise to maintain function; acknowledge and normalize fear that exercise may worsen symptoms.  Encourage regular exercise, at least 10 minutes at a time for 45 minutes per week; consider yoga, water exercise and proprioceptive exercises; encourage use of wearable activity tracker to increase motivation and adherence.  Encourage maintenance or resumption of daily activities, including employment, as pain allows and with minimal exposure to trauma.  Assist patient to advocate for adaptations to the work environment.  Consider level of pain and function, gender, age, lifestyle, patient preference, quality of life, readiness and ?ocapacity to benefit? when recommending patients for orthopaedic surgery consultation.  Explore strategies, such as changes to medication regimen or activity that enables patient to anticipate and manage flare-ups that increase deconditioning and disability.  Explore patient preferences; encourage exposure to a broader range of activities that have been avoided for fear of experiencing pain.  Identify barriers to participation in therapy or exercise, such as pain with activity, anticipated or imagined pain.  Monitor postoperative joint replacement or any preexisting joint replacement for ongoing pain and loss of function; provide social support and encouragement throughout recovery.   Notes:         Depression Screen     View : No data to display.          Fall Risk     View : No data to display.          FALL RISK PREVENTION PERTAINING TO THE HOME:  Any stairs in or around the home? Yes  If so, are there any without handrails? No  Home free of loose throw rugs in walkways, pet beds, electrical cords, etc? Yes  Adequate lighting in your home to reduce risk of falls? Yes   ASSISTIVE DEVICES UTILIZED TO PREVENT FALLS:  Life alert? No  Use of a cane, walker or w/c? Yes  Grab bars in the bathroom? Yes  Shower chair or bench in shower? Yes  Elevated toilet seat or a handicapped toilet? Yes   TIMED UP AND GO:  Was the test performed? No . Needs mechanical lift for transfer, w/c for mobility.   Cognitive Function:        Immunizations Immunization History  Administered Date(s) Administered   H1N1 04/13/2010   Influenza, High Dose Seasonal PF 02/12/2014, 03/29/2018, 01/05/2019   Influenza-Unspecified 03/06/2013, 02/12/2014, 03/06/2017, 03/17/2020, 03/24/2021   Moderna SARS-COV2 Booster Vaccination 11/03/2020   Moderna Sars-Covid-2 Vaccination 06/08/2019, 07/06/2019, 04/14/2020   PFIZER(Purple Top)SARS-COV-2 Vaccination 02/23/2021   Pneumococcal Conjugate-13 02/12/2014   Pneumococcal Polysaccharide-23 02/04/2013, 02/12/2014   Td 09/04/2005   Tdap 12/11/2012   Zoster, Live 07/07/2006    TDAP status: Up to date  Flu Vaccine status: Up to date  Pneumococcal vaccine status: Up to date  Covid-19 vaccine status: Completed vaccines  Qualifies for Shingles Vaccine? Yes   Zostavax completed No   Shingrix Completed?: No.    Education has been provided regarding the importance of this  vaccine. Patient has been advised to call insurance company to determine out of pocket expense if they have not yet received this vaccine. Advised may also receive vaccine at local pharmacy or Health Dept. Verbalized acceptance and understanding.  Screening Tests Health  Maintenance  Topic Date Due   Zoster Vaccines- Shingrix (1 of 2) Never done   COVID-19 Vaccine (5 - Booster for Moderna series) 04/20/2022 (Originally 04/20/2021)   TETANUS/TDAP  12/12/2022   Pneumonia Vaccine 26+ Years old  Completed   INFLUENZA VACCINE  Completed   HPV VACCINES  Aged Out    Health Maintenance  Health Maintenance Due  Topic Date Due   Zoster Vaccines- Shingrix (1 of 2) Never done    Colorectal cancer screening: No longer required.   Lung Cancer Screening: (Low Dose CT Chest recommended if Age 31-80 years, 30 pack-year currently smoking OR have quit w/in 15years.) does not qualify.   Additional Screening:  Hepatitis C Screening: does not qualify  Vision Screening: Recommended annual ophthalmology exams for early detection of glaucoma and other disorders of the eye. Is the patient up to date with their annual eye exam?  No  Who is the provider or what is the name of the office in which the patient attends annual eye exams? Refer if desires.  If pt is not established with a provider, would they like to be referred to a provider to establish care? No .   Dental Screening: Recommended annual dental exams for proper oral hygiene  Community Resource Referral / Chronic Care Management: CRR required this visit?  No   CCM required this visit?  No      Plan:    Obtain MMSE Recommend Eye Exam and Shingrix if patient/HPOA consents.  I have personally reviewed and noted the following in the patient's chart:   Medical and social history Use of alcohol, tobacco or illicit drugs  Current medications and supplements including opioid prescriptions. Patient is not currently taking opioid prescriptions. Functional ability and status Nutritional status Physical activity Advanced directives List of other physicians Hospitalizations, surgeries, and ER visits in previous 12 months Vitals Screenings to include cognitive, depression, and falls Referrals and  appointments  In addition, I have reviewed and discussed with patient certain preventive protocols, quality metrics, and best practice recommendations. A written personalized care plan for preventive services as well as general preventive health recommendations were provided to patient.     Izabel Chim X Jlee Harkless, NP   08/30/2021

## 2021-08-27 ENCOUNTER — Non-Acute Institutional Stay (SKILLED_NURSING_FACILITY): Payer: Medicare Other | Admitting: Nurse Practitioner

## 2021-08-27 ENCOUNTER — Encounter: Payer: Self-pay | Admitting: Nurse Practitioner

## 2021-08-27 DIAGNOSIS — N3942 Incontinence without sensory awareness: Secondary | ICD-10-CM

## 2021-08-27 DIAGNOSIS — G4733 Obstructive sleep apnea (adult) (pediatric): Secondary | ICD-10-CM

## 2021-08-27 DIAGNOSIS — D509 Iron deficiency anemia, unspecified: Secondary | ICD-10-CM

## 2021-08-27 DIAGNOSIS — F419 Anxiety disorder, unspecified: Secondary | ICD-10-CM | POA: Diagnosis not present

## 2021-08-27 DIAGNOSIS — K219 Gastro-esophageal reflux disease without esophagitis: Secondary | ICD-10-CM | POA: Diagnosis not present

## 2021-08-27 DIAGNOSIS — M1711 Unilateral primary osteoarthritis, right knee: Secondary | ICD-10-CM

## 2021-08-27 DIAGNOSIS — E785 Hyperlipidemia, unspecified: Secondary | ICD-10-CM

## 2021-08-27 DIAGNOSIS — K5900 Constipation, unspecified: Secondary | ICD-10-CM

## 2021-08-27 DIAGNOSIS — I10 Essential (primary) hypertension: Secondary | ICD-10-CM

## 2021-08-27 DIAGNOSIS — I4819 Other persistent atrial fibrillation: Secondary | ICD-10-CM | POA: Diagnosis not present

## 2021-08-27 DIAGNOSIS — I5030 Unspecified diastolic (congestive) heart failure: Secondary | ICD-10-CM

## 2021-08-27 DIAGNOSIS — J849 Interstitial pulmonary disease, unspecified: Secondary | ICD-10-CM

## 2021-08-27 NOTE — Progress Notes (Signed)
?Location:   [facility] ?Nursing Home Room Number: 5 A ?Place of Service:  SNF (31) ?Provider:  Decklyn Hornik X, NP ? ?Jerry Dad, MD ? ?Patient Care Team: ?Jerry Dad, MD as PCP - General (Internal Medicine) ?Leonie Maretta Overdorf, MD as PCP - Cardiology (Cardiology) ? ?Extended Emergency Contact Information ?Primary Emergency Contact: Devincent,Gail ?Address: [address] ?         [place], [place] [address] [place] ?Home Phone: [phone] ?Mobile Phone: [phone] ?Relation: Spouse ?Secondary Emergency Contact: [name],[name] ?Address: [address] ?         [name], [place] ?Home Phone: [phone] ?Mobile Phone: [phone] ?Relation: Daughter ? ?Code Status:  DNR ?Goals of care: Advanced Directive information ? ?  08/27/2021  ?  9:21 AM  ?Advanced Directives  ?Does Patient Have a Medical Advance Directive? Yes  ?Type of Paramedic of [place];Living will;Out of facility DNR (pink MOST or yellow form)  ?Does patient want to make changes to medical advance directive? No - Patient declined  ?Copy of [place] in Chart? Yes - validated most recent copy scanned in chart (See row information)  ?Pre-existing out of facility DNR order (yellow form or pink MOST form) Pink MOST form placed in chart (order not valid for inpatient use)  ? ? ? ?Chief Complaint  ?Patient presents with  ? Medical Management of Chronic Issues  ?  Routine follow up  ? Best Practice Recommendations  ?  Shingrix vaccine  ? ? ?HPI:  ?Pt is a 88 y.o. male seen today for medical management of chronic diseases.   ? ?Urinary incontinent, no urinary retention, on Mirabegron, Tamsulosin  ?            Constipation, stable, on MiraLax qd.  ?            OA, uses mechanical lift for transfer, takes prn Tylenol. ?            Hx of Interstitial lung disease, O2 dependent, no further f/u Pulmonology ?            CHF/minimal edema BLE, on Furosamide.  Bun/creat 19/0.8511/15/22           ?Anxiety/depression, stable, on Lexapro.  ?            Afib, heart rate is in control, on Diltiazem '240mg'$  qd, Eliquis '5mg'$  bid. TSH 3.33 11/11/20 ?            GERD, stable, on Esomeprazole ?            Anemia, stable, off Fe. Hgb 14.3 01/12/21 ?            HTN, on Losartan, Diltiazem, Furosemide '40mg'$  qd.  Bun/creat 19/0.85 04/20/21 ?            OSA uses CPAP ?            Hyperlipidemia, takes Atorvastatin, LDL 63 11/11/20 ?Past Medical History:  ?Diagnosis Date  ? Anxiety   ? CAD S/P percutaneous coronary angioplasty 1996, 2009, 01/2010  ? PTCA of L Cx 1996; BMS-RCA [facility]) 2009; Staged PCI RCA (70% ISR) --> 3.0 mm x 23 mm BMS, staged PCI Diag - 2.0 mm x 12 mm Mini Vision BMS (2.25 mm)  ? Cataracts, bilateral   ? immature  ? Chronic back pain   ? scoliosis--pt states unable to have surgery bc of age  ? GERD (gastroesophageal reflux disease)   ?  takes Nexium daily  ? History of colon polyps   ? Hyperlipidemia   ? Hypertension   ? Myocardial infarction [facility]) 1996, 2009  ? x 2;last time was about 8-65yr ago   ? Obesity (BMI 30.0-34.9)   ? OSA (obstructive sleep apnea)   ? Osteoarthritis of both knees   ? Peripheral edema   ? Venous Stasis  ? Persistent atrial fibrillation ([place]   ? Unable to maintaine NSR after DCCV with Tikosyn.  Plan = rate control w/diltiazem (beta blocker off b/c fatigue). CHA2DS2Vasc 4. DOAC - Eliquis.   ? Prostate cancer ([facility] 2005  ? seed implant  ? ?Past Surgical History:  ?Procedure Laterality Date  ? CARDIOVERSION N/A 06/12/2015  ? Procedure: CARDIOVERSION;  Surgeon: MSanda Klein MD;  [facility];  Service: Cardiovascular;  Laterality: N/A;  ? CARDIOVERSION N/A 08/02/2015  ? Procedure: CARDIOVERSION;  Surgeon: JThompson Grayer MD;  [place]  Service: Cardiovascular;  Laterality: N/A;  ? CHOLECYSTECTOMY    ? COLONOSCOPY    ? [place] 2009, August 2011  ? PTCA of L Cx 1996; BMS-RCA ([facility] 2009; Staged PCI RCA  (70% ISR) --> 3.0 mm x 23 mm BMS, staged PCI Diag - 2.0 mm x 12 mm Mini Vision BMS (2.25 mm)  ? cyst removed  in college  ? NEUROPLASTY / TRANSPOSITION MEDIAN NERVE AT CARPAL TUNNEL BILATERAL    ? NM MYOVIEW LTD  November 2013  ? EF 53%; fixed mid inferior-inferolateral defect, infarct with no ischemia.  ? right knee arthrsoscopy  14yrago  ? right trigger finger    ? seeds placed into prostate   2005  ? TEE WITHOUT CARDIOVERSION N/A 06/12/2015  ? Procedure: TRANSESOPHAGEAL ECHOCARDIOGRAM (TEE) - WITH CARDIOVERSION;  Surgeon: MiSanda KleinMD;  [facility] Service: Cardiovascular: EF 55-60%.No LA or LAA thrombus. Normal Peal PAP ~33 mmHg.  ? TOTAL KNEE ARTHROPLASTY  05/09/2012  ? Procedure: TOTAL KNEE ARTHROPLASTY;  Surgeon: FrKerin SalenMD;  [place] [facility]: [place]ics;  Laterality: Right;  ? TRANSTHORACIC ECHOCARDIOGRAM  06/2015  ? EF 60-65%. Mild LA dilation. Normal PA pressures: 32 mmHg. Aortic sclerosis but no stenosis.  ? wisdom teeth extraccted    ? ? ?Allergies  ?Allergen Reactions  ? Crab [Shellfish Allergy] Rash  ? Codeine   ? Hydrocodone Hives  ? Adhesive [Tape] Rash  ? Oxycodone Rash  ? ? ?Allergies as of 08/27/2021   ? ?   Reactions  ? Crab [shellfish Allergy] Rash  ? Codeine   ? Hydrocodone Hives  ? Adhesive [tape] Rash  ? Oxycodone Rash  ? ?  ? ?  ?Medication List  ?  ? ?  ? Accurate as of August 27, 2021 11:59 PM. If you have any questions, ask your nurse or doctor.  ?  ?  ? ?  ? ?acetaminophen 325 MG tablet ?Commonly known as: TYLENOL ?Take 650 mg by mouth every 6 (six) hours as needed. ?  ?atorvastatin 20 MG tablet ?Commonly known as: LIPITOR ?Take 20 mg by mouth daily. ?  ?Biofreeze 4 % Gel ?Generic drug: Menthol (Topical Analgesic) ?Apply 1 application topically in the morning, at noon, in the evening, and at bedtime. Apply to right great toe ?  ?diltiazem 240 MG 24 hr capsule ?Commonly known as: DILACOR XR ?Take 240 mg by mouth daily. For heart failure ?  ?Eliquis 5 MG  Tabs tablet ?Generic drug: apixaban ?TAKE 1 TABLET TWICE A  DAY (SCHEDULE FOLLOW UP WITH CARDIOLOGIST PRIOR TO NEXT REFILL REQUEST) ?  ?escitalopram 10 MG tablet ?Commonly known as: LEXAPRO ?Take 10 mg by mouth daily. ?  ?esomeprazole 40 MG capsule ?Commonly known as: [place] ?TAKE 1 CAPSULE DAILY ?  ?furosemide 40 MG tablet ?Commonly known as: LASIX ?Take 40 mg by mouth daily. ?  ?losartan 25 MG tablet ?Commonly known as: COZAAR ?Take 25 mg by mouth daily. ?  ?mirabegron ER 25 MG Tb24 tablet ?Commonly known as: MYRBETRIQ ?Take 25 mg by mouth daily. ?  ?MIRALAX PO ?Take 17 g by mouth daily. ?  ?nitroGLYCERIN 0.4 MG SL tablet ?Commonly known as: NITROSTAT ?Place 0.4 mg under the tongue as needed for chest pain. ?  ?PreviDent 5000 Booster Plus 1.1 % Pste ?Generic drug: Sodium Fluoride ?Place 1 application onto teeth at bedtime. ?  ?tamsulosin 0.4 MG Caps capsule ?Commonly known as: FLOMAX ?Take 0.4 mg by mouth daily. ?  ? ?  ? ? ?Review of Systems  ?Constitutional:  Positive for unexpected weight change. Negative for fatigue and fever.  ?     Gradual weight loss #2-4Ibs last month, dietary f/u.   ?HENT:  Positive for hearing loss. Negative for congestion and trouble swallowing.   ?Eyes:  Negative for visual disturbance.  ?Respiratory:  Positive for shortness of breath. Negative for cough.   ?     O2 dependent. DOE  ?Cardiovascular:  Positive for leg swelling.  ?Gastrointestinal:  Negative for abdominal pain and constipation.  ?     Incontinent of feces.   ?Genitourinary:  Positive for frequency. Negative for dysuria and urgency.  ?     Incontinent of urine.   ?Musculoskeletal:  Positive for arthralgias and gait problem.  ?     Left knee pain is chronic.   ?Skin:  Negative for color change.  ?Neurological:  Negative for speech difficulty, weakness and headaches.  ?     Memory lapses.   ?Psychiatric/Behavioral:  Negative for behavioral problems and sleep disturbance. The patient is nervous/anxious.   ?     Anxious at  times.   ? ?Immunization History  ?Administered Date(s) Administered  ? H1N1 04/13/2010  ? Influenza, High Dose Seasonal PF 02/12/2014, 03/29/2018, 01/05/2019  ? Influenza-Unspecified 03/06/2013, 02/12/2014, 10/01/

## 2021-08-30 ENCOUNTER — Encounter: Payer: Self-pay | Admitting: Nurse Practitioner

## 2021-08-30 NOTE — Assessment & Plan Note (Signed)
heart rate is in control, on Diltiazem '240mg'$  qd, Eliquis '5mg'$  bid. TSH 3.33 11/11/20 ?

## 2021-08-30 NOTE — Assessment & Plan Note (Signed)
minimal edema BLE, on Furosamide. Bun/creat 19/0.8511/15/22        ?

## 2021-08-30 NOTE — Assessment & Plan Note (Signed)
takes Atorvastatin, LDL 63 11/11/20 ?

## 2021-08-30 NOTE — Assessment & Plan Note (Signed)
Hx of Interstitial lung disease, O2 dependent, no further f/u Pulmonology 

## 2021-08-30 NOTE — Assessment & Plan Note (Signed)
Uses CPAP 

## 2021-08-30 NOTE — Assessment & Plan Note (Signed)
stable, on Esomeprazole 

## 2021-08-30 NOTE — Assessment & Plan Note (Signed)
stable, on MiraLax qd. 

## 2021-08-30 NOTE — Assessment & Plan Note (Signed)
stable, on Lexapro. needs Alprazolam 0.'5mg'$  30 minutes prior to dermatology procedures 08/31/21 ?

## 2021-08-30 NOTE — Assessment & Plan Note (Signed)
stable, off Fe. Hgb 14.3 01/12/21 

## 2021-08-30 NOTE — Assessment & Plan Note (Signed)
no urinary retention, on Mirabegron, Tamsulosin  

## 2021-08-30 NOTE — Assessment & Plan Note (Signed)
Blood pressure is controlled, on Losartan, Diltiazem, Furosemide '40mg'$  qd.  Bun/creat 19/0.85 04/20/21 ?

## 2021-08-30 NOTE — Assessment & Plan Note (Signed)
uses mechanical lift for transfer, takes prn Tylenol is adequate.  ?

## 2021-10-12 ENCOUNTER — Encounter: Payer: Self-pay | Admitting: Internal Medicine

## 2021-10-12 NOTE — Progress Notes (Signed)
?Location:   [facility] ?Nursing Home Room Number: 5 ?Place of Service:  SNF (31) ?Provider:  .Veleta Miners MD ? ?Jerry Dad, MD ? ?Patient Care Team: ?Jerry Dad, MD as PCP - General (Internal Medicine) ?Leonie Man, MD as PCP - Cardiology (Cardiology) ? ?Extended Emergency Contact Information ?Primary Emergency Contact: [name],Jerry ?Address: [address] ?         [place], [place] [address] [place] ?Home Phone: [phone] ?Mobile Phone: [phone] ?Relation: Spouse ?Secondary Emergency Contact: [name],Jerry ?Address: [address] ?         [place], [place] ?Home Phone: [phone] ?Mobile Phone: [phone] ?Relation: Daughter ? ?Code Status:  DNR Managed Care ?Goals of care: Advanced Directive information ? ?  10/12/2021  ? 10:37 AM  ?Advanced Directives  ?Does Patient Have a Medical Advance Directive? Yes  ?Type of Paramedic of [place];Living will;Out of facility DNR (pink MOST or yellow form)  ?Does patient want to make changes to medical advance directive? No - Patient declined  ?Copy of [place] in Chart? Yes - validated most recent copy scanned in chart (See row information)  ?Pre-existing out of facility DNR order (yellow form or pink MOST form) Pink MOST form placed in chart (order not valid for inpatient use)  ? ? ? ?Chief Complaint  ?Patient presents with  ? Medical Management of Chronic Issues  ? ? ?HPI:  ?Pt is a 88 y.o. male seen today for medical management of chronic diseases.   ? ? ?Past Medical History:  ?Diagnosis Date  ? Anxiety   ? CAD S/P percutaneous coronary angioplasty 1996, 2009, 01/2010  ? PTCA of L Cx 1996; BMS-RCA [facility]) 2009; Staged PCI RCA (70% ISR) --> 3.0 mm x 23 mm BMS, staged PCI Diag - 2.0 mm x 12 mm Mini Vision BMS (2.25 mm)  ? Cataracts, bilateral   ? immature  ? Chronic back pain   ? scoliosis--pt states unable to have surgery bc of age  ? GERD  (gastroesophageal reflux disease)   ? takes Nexium daily  ? History of colon polyps   ? Hyperlipidemia   ? Hypertension   ? Myocardial infarction [facility]) 1996, 2009  ? x 2;last time was about 8-56yr ago   ? Obesity (BMI 30.0-34.9)   ? OSA (obstructive sleep apnea)   ? Osteoarthritis of both knees   ? Peripheral edema   ? Venous Stasis  ? Persistent atrial fibrillation ([place]   ? Unable to maintaine NSR after DCCV with Tikosyn.  Plan = rate control w/diltiazem (beta blocker off b/c fatigue). CHA2DS2Vasc 4. DOAC - Eliquis.   ? Prostate cancer ([facility] 2005  ? seed implant  ? ?Past Surgical History:  ?Procedure Laterality Date  ? CARDIOVERSION N/A 06/12/2015  ? Procedure: CARDIOVERSION;  Surgeon: MSanda Klein MD;  [facility];  Service: Cardiovascular;  Laterality: N/A;  ? CARDIOVERSION N/A 08/02/2015  ? Procedure: CARDIOVERSION;  Surgeon: JThompson Grayer MD;  [facility]  Service: Cardiovascular;  Laterality: N/A;  ? CHOLECYSTECTOMY    ? COLONOSCOPY    ? [place] 2009, August 2011  ? PTCA of L Cx 1996; BMS-RCA ([facility] 2009; Staged PCI RCA (70% ISR) --> 3.0 mm x 23 mm BMS, staged PCI Diag - 2.0 mm x 12 mm Mini Vision BMS (2.25 mm)  ? cyst removed  in college  ? NEUROPLASTY / TRANSPOSITION MEDIAN NERVE AT  CARPAL TUNNEL BILATERAL    ? NM MYOVIEW LTD  November 2013  ? EF 53%; fixed mid inferior-inferolateral defect, infarct with no ischemia.  ? right knee arthrsoscopy  55yr ago  ? right trigger finger    ? seeds placed into prostate   2005  ? TEE WITHOUT CARDIOVERSION N/A 06/12/2015  ? Procedure: TRANSESOPHAGEAL ECHOCARDIOGRAM (TEE) - WITH CARDIOVERSION;  Surgeon: MSanda Klein MD;  [facility]  Service: Cardiovascular: EF 55-60%.No LA or LAA thrombus. Normal Peal PAP ~33 mmHg.  ? TOTAL KNEE ARTHROPLASTY  05/09/2012  ? Procedure: TOTAL KNEE ARTHROPLASTY;  Surgeon: FKerin Salen MD;  [place] [facility]  Service: Orthopedics;  Laterality: Right;  ? TRANSTHORACIC  ECHOCARDIOGRAM  06/2015  ? EF 60-65%. Mild LA dilation. Normal PA pressures: 32 mmHg. Aortic sclerosis but no stenosis.  ? wisdom teeth extraccted    ? ? ?Allergies  ?Allergen Reactions  ? Crab [Shellfish Allergy] Rash  ? Codeine   ? Hydrocodone Hives  ? Adhesive [Tape] Rash  ? Oxycodone Rash  ? ? ?Allergies as of 10/12/2021   ? ?   Reactions  ? Crab [shellfish Allergy] Rash  ? Codeine   ? Hydrocodone Hives  ? Adhesive [tape] Rash  ? Oxycodone Rash  ? ?  ? ?  ?Medication List  ?  ? ?  ? Accurate as of Oct 12, 2021 10:37 AM. If you have any questions, ask your nurse or doctor.  ?  ?  ? ?  ? ?acetaminophen 325 MG tablet ?Commonly known as: TYLENOL ?Take 650 mg by mouth every 6 (six) hours as needed. ?  ?atorvastatin 20 MG tablet ?Commonly known as: LIPITOR ?Take 20 mg by mouth daily. ?  ?Biofreeze 4 % Gel ?Generic drug: Menthol (Topical Analgesic) ?Apply 1 application topically in the morning, at noon, in the evening, and at bedtime. Apply to right great toe ?  ?diltiazem 240 MG 24 hr capsule ?Commonly known as: DILACOR XR ?Take 240 mg by mouth daily. For heart failure ?  ?Eliquis 5 MG Tabs tablet ?Generic drug: apixaban ?TAKE 1 TABLET TWICE A DAY (SCHEDULE FOLLOW UP WITH CARDIOLOGIST PRIOR TO NEXT REFILL REQUEST) ?  ?escitalopram 10 MG tablet ?Commonly known as: LEXAPRO ?Take 10 mg by mouth daily. ?  ?esomeprazole 40 MG capsule ?Commonly known as: [place]?TAKE 1 CAPSULE DAILY ?  ?furosemide 40 MG tablet ?Commonly known as: LASIX ?Take 40 mg by mouth daily. ?  ?losartan 25 MG tablet ?Commonly known as: COZAAR ?Take 25 mg by mouth daily. ?  ?mirabegron ER 25 MG Tb24 tablet ?Commonly known as: MYRBETRIQ ?Take 25 mg by mouth daily. ?  ?MIRALAX PO ?Take 17 g by mouth daily. ?  ?nitroGLYCERIN 0.4 MG SL tablet ?Commonly known as: NITROSTAT ?Place 0.4 mg under the tongue as needed for chest pain. ?  ?PreviDent 5000 Booster Plus 1.1 % Pste ?Generic drug: Sodium Fluoride ?Place 1 application onto teeth at bedtime. ?  ?tamsulosin  0.4 MG Caps capsule ?Commonly known as: FLOMAX ?Take 0.4 mg by mouth daily. ?  ? ?  ? ? ?Review of Systems ? ?Immunization History  ?Administered Date(s) Administered  ? H1N1 04/13/2010  ? Influenza, High Dose Seasonal PF 02/12/2014, 03/29/2018, 01/05/2019  ? Influenza-Unspecified 03/06/2013, 02/12/2014, 03/06/2017, 03/17/2020, 03/24/2021  ? Moderna SARS-COV2 Booster Vaccination 11/03/2020  ? Moderna Sars-Covid-2 Vaccination 06/08/2019, 07/06/2019, 04/14/2020  ? PFIZER(Purple Top)SARS-COV-2 Vaccination 02/23/2021  ? Pneumococcal Conjugate-13 02/12/2014  ? Pneumococcal Polysaccharide-23 02/04/2013, 02/12/2014  ? Td 09/04/2005  ? Tdap  12/11/2012  ? Zoster Recombinat (Shingrix) 08/27/2021  ? Zoster, Live 07/07/2006  ? ?Pertinent  Health Maintenance Due  ?Topic Date Due  ? INFLUENZA VACCINE  01/04/2022  ? ? ?  01/30/2019  ?  5:00 PM  ?Fall Risk  ?Patient Fall Risk Level Low fall risk  ? ?Functional Status Survey: ?  ? ?Vitals:  ? 10/12/21 1033  ?BP: 133/80  ?Pulse: 94  ?Resp: 18  ?Temp: (!) 97 ?F (36.1 ?C)  ?SpO2: 91%  ?Weight: 202 lb 6.4 oz (91.8 kg)  ?Height: '5\' 7"'$  (1.702 m)  ? ?Body mass index is 31.7 kg/m?[name] ?Physical Exam ? ?Labs reviewed: ?Recent Labs  ?  11/11/20 ?0000 04/20/21 ?0000  ?NA 140 138  ?K 4.0 4.2  ?CL 101 101  ?CO2 27* 32*  ?BUN 20 19  ?CREATININE 1.0 0.9  ?CALCIUM 8.9 8.7  ? ?Recent Labs  ?  11/11/20 ?0000 04/20/21 ?0000  ?AST 9* 9*  ?ALT 4* 3*  ?ALKPHOS 63 62  ?ALBUMIN 3.5 3.5  ? ?Recent Labs  ?  11/11/20 ?0000 01/12/21 ?0000  ?WBC 7.3 9.0  ?NEUTROABS 4,358.00 6,408.00  ?HGB 14.2 14.3  ?HCT 42 41  ?PLT 205 205  ? ?Lab Results  ?Component Value Date  ? TSH 3.33 11/11/2020  ? ?No results found for: HGBA1C ?Lab Results  ?Component Value Date  ? CHOL 116 11/11/2020  ? HDL 37 11/11/2020  ? [place] 63 11/11/2020  ? TRIG 84 11/11/2020  ? CHOLHDL 2.9 02/12/2014  ? ? ?Significant Diagnostic Results in last 30 days:  ?No results found. ? ?Assessment/Plan ?There are no diagnoses linked to this  encounter. ? ? ?Family/ staff Communication:  ? ?Labs/tests ordered:   ? ?  ?

## 2021-10-19 ENCOUNTER — Encounter: Payer: Self-pay | Admitting: Internal Medicine

## 2021-10-19 ENCOUNTER — Non-Acute Institutional Stay (SKILLED_NURSING_FACILITY): Payer: Medicare Other | Admitting: Internal Medicine

## 2021-10-19 DIAGNOSIS — I4819 Other persistent atrial fibrillation: Secondary | ICD-10-CM | POA: Diagnosis not present

## 2021-10-19 DIAGNOSIS — G4733 Obstructive sleep apnea (adult) (pediatric): Secondary | ICD-10-CM

## 2021-10-19 DIAGNOSIS — F419 Anxiety disorder, unspecified: Secondary | ICD-10-CM

## 2021-10-19 DIAGNOSIS — I10 Essential (primary) hypertension: Secondary | ICD-10-CM | POA: Diagnosis not present

## 2021-10-19 DIAGNOSIS — K219 Gastro-esophageal reflux disease without esophagitis: Secondary | ICD-10-CM

## 2021-10-19 DIAGNOSIS — I5030 Unspecified diastolic (congestive) heart failure: Secondary | ICD-10-CM

## 2021-10-19 DIAGNOSIS — N3942 Incontinence without sensory awareness: Secondary | ICD-10-CM

## 2021-10-19 DIAGNOSIS — J849 Interstitial pulmonary disease, unspecified: Secondary | ICD-10-CM | POA: Diagnosis not present

## 2021-10-19 DIAGNOSIS — E785 Hyperlipidemia, unspecified: Secondary | ICD-10-CM

## 2021-10-19 DIAGNOSIS — D509 Iron deficiency anemia, unspecified: Secondary | ICD-10-CM

## 2021-10-19 NOTE — Progress Notes (Signed)
Location:   [facility] Room Number: 5 Place of Service:  SNF [phone]) Provider:  Veleta Miners MD  Virgie Dad, MD  Patient Care Team: Virgie Dad, MD as PCP - General (Internal Medicine) Leonie Man, MD as PCP - Cardiology (Cardiology)  Extended Emergency Contact Information Primary Emergency Contact: [facility] Address: [address]          [place], [place] [address] [name] of [place] Phone: [phone] Mobile Phone: [phone] Relation: Spouse Secondary Emergency Contact: Janey Genta Address: [address]          [place], [place] Phone: [phone] Mobile Phone: [phone] Relation: Daughter  Code Status:  DNR Managed Care Goals of care: Advanced Directive information    10/19/2021    3:20 PM  Advanced Directives  Does Patient Have a Medical Advance Directive? Yes  Type of Paramedic of [place];Living will;Out of facility DNR (pink MOST or yellow form)  Does patient want to make changes to medical advance directive? No - Patient declined  Copy of [place] in Chart? Yes - validated most recent copy scanned in chart (See row information)  Pre-existing out of facility DNR order (yellow form or pink MOST form) Pink MOST form placed in chart (order not valid for inpatient use)     Chief Complaint  Patient presents with   Medical Management of Chronic Issues    HPI:  Pt is a 88 y.o. male seen today for an Routine Visit  Long term resident of SNF   Patient has a history of CAD s/p PTCA, chronic atrial fibrillation on Eliquis,  chronic diastolic CHF,Interstitial lung disease with hypoxia on chronic oxygen,  OSA on CPAP doesn't use it  chronic back pain with spinal stenosis, S C-spine kyphosis,  cognitive impairment H/O Rosacea Anxiety and Depression   Patient continues to be nonambulatory.  Very sedentary.  Stays in his  room. Per his friend today he is cognitively also declining He is sometimes rude to nursing care Also does not wear his Oxygen and get drowsy Wt Readings from Last 3 Encounters:  10/19/21 202 lb 6.4 oz (91.8 kg)  10/12/21 202 lb 6.4 oz (91.8 kg)  08/27/21 204 lb 9.6 oz (92.8 kg)    Has lost 6 lbs since my last visit   Past Medical History:  Diagnosis Date   Anxiety    CAD S/P percutaneous coronary angioplasty 1996, 2009, 01/2010   PTCA of L Cx 1996; BMS-RCA [facility]) 2009; Staged PCI RCA (70% ISR) --> 3.0 mm x 23 mm BMS, staged PCI Diag - 2.0 mm x 12 mm Mini Vision BMS (2.25 mm)   Cataracts, bilateral    immature   Chronic back pain    scoliosis--pt states unable to have surgery bc of age   GERD (gastroesophageal reflux disease)    takes Nexium daily   History of colon polyps    Hyperlipidemia    Hypertension    Myocardial infarction ([place]) 1996, 2009   x 2;last time was about 8-66yr ago    Obesity (BMI 30.0-34.9)    OSA (obstructive sleep apnea)    Osteoarthritis of both knees    Peripheral edema    Venous Stasis   Persistent atrial fibrillation ([place]    Unable to maintaine NSR after DCCV with Tikosyn.  Plan = rate control w/diltiazem (beta blocker off b/c fatigue). CHA2DS2Vasc 4. DOAC - Eliquis.    Prostate cancer (  [place]) 2005   seed implant   Past Surgical History:  Procedure Laterality Date   CARDIOVERSION N/A 06/12/2015   Procedure: CARDIOVERSION;  Surgeon: Sanda Klein, MD;  Location: [facility];  Service: Cardiovascular;  Laterality: N/A;   CARDIOVERSION N/A 08/02/2015   Procedure: CARDIOVERSION;  Surgeon: [name], MD;  Location: [facility];  Service: Cardiovascular;  Laterality: N/A;   [place], 2009, August 2011   PTCA of L Cx 1996; BMS-RCA [facility]) 2009; Staged PCI RCA (70% ISR) --> 3.0 mm x 23 mm BMS, staged PCI Diag - 2.0 mm x 12 mm Mini Vision BMS (2.25 mm)   cyst removed  in college    NEUROPLASTY / TRANSPOSITION MEDIAN NERVE AT CARPAL TUNNEL BILATERAL     NM MYOVIEW LTD  November 2013   EF 53%; fixed mid inferior-inferolateral defect, infarct with no ischemia.   right knee arthrsoscopy  76yr ago   right trigger finger     seeds placed into prostate   2005   TEE WITHOUT CARDIOVERSION N/A 06/12/2015   Procedure: TRANSESOPHAGEAL ECHOCARDIOGRAM (TEE) - WITH CARDIOVERSION;  Surgeon: MSanda Klein MD;  [facility]  Service: Cardiovascular: EF 55-60%.No LA or LAA thrombus. Normal Peal PAP ~33 mmHg.   TOTAL KNEE ARTHROPLASTY  05/09/2012   Procedure: TOTAL KNEE ARTHROPLASTY;  Surgeon: FKerin Salen MD;  [facility]  Service: Orthopedics;  Laterality: Right;   TRANSTHORACIC ECHOCARDIOGRAM  06/2015   EF 60-65%. Mild LA dilation. Normal PA pressures: 32 mmHg. Aortic sclerosis but no stenosis.   wisdom teeth extraccted      Allergies  Allergen Reactions   Crab [Shellfish Allergy] Rash   Codeine    Hydrocodone Hives   Adhesive [Tape] Rash   Oxycodone Rash    Allergies as of 10/19/2021       Reactions   Crab [shellfish Allergy] Rash   Codeine    Hydrocodone Hives   Adhesive [tape] Rash   Oxycodone Rash        Medication List        Accurate as of Oct 19, 2021 11:59 PM. If you have any questions, ask your nurse or doctor.          acetaminophen 325 MG tablet Commonly known as: TYLENOL Take 650 mg by mouth every 6 (six) hours as needed.   atorvastatin 20 MG tablet Commonly known as: LIPITOR Take 20 mg by mouth daily.   Biofreeze 4 % Gel Generic drug: Menthol (Topical Analgesic) Apply 1 application topically in the morning, at noon, in the evening, and at bedtime. Apply to right great toe   diltiazem 240 MG 24 hr capsule Commonly known as: DILACOR XR Take 240 mg by mouth daily. For heart failure   Eliquis 5 MG Tabs tablet Generic drug: apixaban TAKE 1 TABLET TWICE A DAY (SCHEDULE FOLLOW UP WITH CARDIOLOGIST PRIOR TO NEXT REFILL  REQUEST)   escitalopram 10 MG tablet Commonly known as: LEXAPRO Take 10 mg by mouth daily.   esomeprazole 40 MG capsule Commonly known as: NEXIUM TAKE 1 CAPSULE DAILY   furosemide 40 MG tablet Commonly known as: LASIX Take 40 mg by mouth daily.   losartan 25 MG tablet Commonly known as: COZAAR Take 25 mg by mouth daily.   mirabegron ER 25 MG Tb24 tablet Commonly known as: MYRBETRIQ Take 25 mg by mouth daily.   MIRALAX PO Take 17 g by mouth daily.  nitroGLYCERIN 0.4 MG SL tablet Commonly known as: NITROSTAT Place 0.4 mg under the tongue as needed for chest pain.   PreviDent 5000 Booster Plus 1.1 % Pste Generic drug: Sodium Fluoride Place 1 application onto teeth at bedtime.   tamsulosin 0.4 MG Caps capsule Commonly known as: FLOMAX Take 0.4 mg by mouth daily.        Review of Systems  Constitutional:  Positive for activity change. Negative for appetite change and unexpected weight change.  HENT: Negative.    Respiratory:  Negative for cough and shortness of breath.   Cardiovascular:  Negative for leg swelling.  Gastrointestinal:  Negative for constipation.  Genitourinary:  Negative for frequency.  Musculoskeletal:  Positive for gait problem. Negative for arthralgias and myalgias.  Skin: Negative.  Negative for rash.  Neurological:  Negative for dizziness and weakness.  Psychiatric/Behavioral:  Positive for confusion and dysphoric mood. Negative for sleep disturbance.   All other systems reviewed and are negative.  Immunization History  Administered Date(s) Administered   H1N1 04/13/2010   Influenza, High Dose Seasonal PF 02/12/2014, 03/29/2018, 01/05/2019   Influenza-Unspecified 03/06/2013, 02/12/2014, 03/06/2017, 03/17/2020, 03/24/2021   Moderna SARS-COV2 Booster Vaccination 11/03/2020   Moderna Sars-Covid-2 Vaccination 06/08/2019, 07/06/2019, 04/14/2020   PFIZER(Purple Top)SARS-COV-2 Vaccination 02/23/2021   Pneumococcal Conjugate-13 02/12/2014    Pneumococcal Polysaccharide-23 02/04/2013, 02/12/2014   Td 09/04/2005   Tdap 12/11/2012   Zoster Recombinat (Shingrix) 08/27/2021   Zoster, Live 07/07/2006   Pertinent  Health Maintenance Due  Topic Date Due   INFLUENZA VACCINE  01/04/2022      01/30/2019    5:00 PM  Fall Risk  Patient Fall Risk Level Low fall risk   Functional Status Survey:    Vitals:   10/19/21 1503  BP: (!) 105/59  Pulse: 71  Resp: 18  Temp: (!) 97.3 F (36.3 C)  SpO2: 93%  Weight: 202 lb 6.4 oz (91.8 kg)  Height: '5\' 7"'$  (1.702 m)   Body mass index is 31.7 kg/m. Physical Exam Vitals reviewed.  Constitutional:      Appearance: Normal appearance. He is obese.  HENT:     Head: Normocephalic.     Nose: Nose normal.     Mouth/Throat:     Mouth: Mucous membranes are moist.     Pharynx: Oropharynx is clear.  Eyes:     Pupils: Pupils are equal, round, and reactive to light.  Cardiovascular:     Rate and Rhythm: Normal rate and regular rhythm.     Pulses: Normal pulses.     Heart sounds: No murmur heard. Pulmonary:     Effort: Pulmonary effort is normal. No respiratory distress.     Breath sounds: Normal breath sounds. No rales.  Abdominal:     General: Abdomen is flat. Bowel sounds are normal.     Palpations: Abdomen is soft.  Musculoskeletal:        General: No swelling.     Cervical back: Neck supple.  Skin:    General: Skin is warm.  Neurological:     General: No focal deficit present.     Mental Status: He is alert.  Psychiatric:        Mood and Affect: Mood normal.        Thought Content: Thought content normal.    Labs reviewed: Recent Labs    11/11/20 0000 04/20/21 0000  NA 140 138  K 4.0 4.2  CL 101 101  CO2 27* 32*  BUN 20 19  CREATININE 1.0 0.9  CALCIUM 8.9 8.7   Recent Labs    11/11/20 0000 04/20/21 0000  AST 9* 9*  ALT 4* 3*  ALKPHOS 63 62  ALBUMIN 3.5 3.5   Recent Labs    11/11/20 0000 01/12/21 0000  WBC 7.3 9.0  NEUTROABS 4,358.00 6,408.00  HGB  14.2 14.3  HCT 42 41  PLT 205 205   Lab Results  Component Value Date   TSH 3.33 11/11/2020   No results found for: HGBA1C Lab Results  Component Value Date   CHOL 116 11/11/2020   HDL 37 11/11/2020   LDLCALC 63 11/11/2020   TRIG 84 11/11/2020   CHOLHDL 2.9 02/12/2014    Significant Diagnostic Results in last 30 days:  No results found.  Assessment/Plan 1. Persistent atrial fibrillation ([place]):  CHA2DS2-VASc Score 4; On Eliquis On Eliquis and Cardizem  2. Essential hypertension Most of the BP in the Matrix in Good levels On Losartan and Cardizem Will monitor  3. OSA (obstructive sleep apnea) Refuses CPAP  4. ILD (interstitial lung disease) ([place]) On Oxygen No Follow with Pulmonary  5. Hyperlipidemia with target LDL less than 70 On statin  6. Iron deficiency anemia, unspecified iron deficiency anemia type Off iron  7. Urinary incontinence without sensory awareness Myrbetriq and Flomax  8. Diastolic congestive heart failure, unspecified HF chronicity (HCC) Continue Lasix  9. Anxiety On Lexapro and Low dose ativan PRn  10. Gastroesophageal reflux disease, unspecified whether esophagitis present On Nexium 11 Mild Cognitive impairment   Family/ staff Communication:   Labs/tests ordered:   CBC,CMP,Lipid

## 2021-10-22 LAB — BASIC METABOLIC PANEL
BUN: 22 — AB (ref 4–21)
Chloride: 102 (ref 99–108)
Creatinine: 0.8 (ref 0.6–1.3)
Glucose: 86
Potassium: 3.8 mEq/L (ref 3.5–5.1)
Sodium: 141 (ref 137–147)

## 2021-10-22 LAB — LIPID PANEL
Cholesterol: 124 (ref 0–200)
HDL: 39 (ref 35–70)
LDL Cholesterol: 70
LDl/HDL Ratio: 3.2
Triglycerides: 69 (ref 40–160)

## 2021-10-22 LAB — COMPREHENSIVE METABOLIC PANEL: eGFR: 83

## 2021-11-17 ENCOUNTER — Encounter: Payer: Self-pay | Admitting: Adult Health

## 2021-11-17 ENCOUNTER — Non-Acute Institutional Stay (SKILLED_NURSING_FACILITY): Payer: Medicare Other | Admitting: Adult Health

## 2021-11-17 DIAGNOSIS — Z9861 Coronary angioplasty status: Secondary | ICD-10-CM

## 2021-11-17 DIAGNOSIS — I5030 Unspecified diastolic (congestive) heart failure: Secondary | ICD-10-CM

## 2021-11-17 DIAGNOSIS — J849 Interstitial pulmonary disease, unspecified: Secondary | ICD-10-CM

## 2021-11-17 DIAGNOSIS — I10 Essential (primary) hypertension: Secondary | ICD-10-CM

## 2021-11-17 DIAGNOSIS — R4189 Other symptoms and signs involving cognitive functions and awareness: Secondary | ICD-10-CM

## 2021-11-17 DIAGNOSIS — I4819 Other persistent atrial fibrillation: Secondary | ICD-10-CM | POA: Diagnosis not present

## 2021-11-17 DIAGNOSIS — I2510 Atherosclerotic heart disease of native coronary artery without angina pectoris: Secondary | ICD-10-CM

## 2021-11-17 DIAGNOSIS — F339 Major depressive disorder, recurrent, unspecified: Secondary | ICD-10-CM

## 2021-11-17 NOTE — Progress Notes (Unsigned)
Location:   [place] Room Number: 5 Place of Service:  SNF (31) Provider:  Nickola Major, NP   Virgie Dad, MD  Patient Care Team: Virgie Dad, MD as PCP - General (Internal Medicine) Leonie Man, MD as PCP - Cardiology (Cardiology)  Extended Emergency Contact Information Primary Emergency Contact: [facility] Address: [address]          [place], [place] [address] [name] of [place] Phone: [phone] Mobile Phone: [phone] Relation: Spouse Secondary Emergency Contact: Janey Genta Address: [address]          [place], [place] of [place] Phone: [phone] Mobile Phone: [phone] Relation: Daughter  Code Status:  DNR Managed Care Goals of care: Advanced Directive information    11/17/2021   10:25 AM  Advanced Directives  Does Patient Have a Medical Advance Directive? Yes  Type of Paramedic of [place];Living will;Out of facility DNR (pink MOST or yellow form)  Does patient want to make changes to medical advance directive? No - Patient declined  Copy of [place] in Chart? Yes - validated most recent copy scanned in chart (See row information)  Pre-existing out of facility DNR order (yellow form or pink MOST form) Pink MOST form placed in chart (order not valid for inpatient use)     Chief Complaint  Patient presents with   Medical Management of Chronic Issues   Quality Metric Gaps    Verified matrix and NCIR patient is due for 2/2 shingrix vaccine.     HPI:  Pt is a 88 y.o. male seen today for medical management of chronic diseases.     Past Medical History:  Diagnosis Date   Anxiety    CAD S/P percutaneous coronary angioplasty 1996, 2009, 01/2010   PTCA of L Cx 1996; BMS-RCA [facility]) 2009; Staged PCI RCA (70% ISR) --> 3.0 mm x 23 mm BMS, staged PCI Diag - 2.0 mm x 12 mm Mini Vision BMS (2.25 mm)   Cataracts, bilateral     immature   Chronic back pain    scoliosis--pt states unable to have surgery bc of age   GERD (gastroesophageal reflux disease)    takes Nexium daily   History of colon polyps    Hyperlipidemia    Hypertension    Myocardial infarction ([place]) 1996, 2009   x 2;last time was about 8-12yr ago    Obesity (BMI 30.0-34.9)    OSA (obstructive sleep apnea)    Osteoarthritis of both knees    Peripheral edema    Venous Stasis   Persistent atrial fibrillation ([place]    Unable to maintaine NSR after DCCV with Tikosyn.  Plan = rate control w/diltiazem (beta blocker off b/c fatigue). CHA2DS2Vasc 4. DOAC - Eliquis.    Prostate cancer ([place] 2005   seed implant   Past Surgical History:  Procedure Laterality Date   CARDIOVERSION N/A 06/12/2015   Procedure: CARDIOVERSION;  Surgeon: MSanda Klein MD;  [facility];  Service: Cardiovascular;  Laterality: N/A;   CARDIOVERSION N/A 08/02/2015   Procedure: CARDIOVERSION;  Surgeon: JThompson Grayer MD;  [facility]  Service: Cardiovascular;  Laterality: N/A;   [place] 2009, August 2011   PTCA of L Cx 1996; BMS-RCA ([facility] 2009; Staged PCI RCA (70% ISR) --> 3.0 mm x 23 mm BMS, staged PCI Diag - 2.0 mm x  12 mm Mini Vision BMS (2.25 mm)   cyst removed  in college   NEUROPLASTY / TRANSPOSITION MEDIAN NERVE AT CARPAL TUNNEL BILATERAL     NM MYOVIEW LTD  November 2013   EF 53%; fixed mid inferior-inferolateral defect, infarct with no ischemia.   right knee arthrsoscopy  20yr ago   right trigger finger     seeds placed into prostate   2005   TEE WITHOUT CARDIOVERSION N/A 06/12/2015   Procedure: TRANSESOPHAGEAL ECHOCARDIOGRAM (TEE) - WITH CARDIOVERSION;  Surgeon: MSanda Klein MD;  [facility]  Service: Cardiovascular: EF 55-60%.No LA or LAA thrombus. Normal Peal PAP ~33 mmHg.   TOTAL KNEE ARTHROPLASTY  05/09/2012   Procedure: TOTAL KNEE ARTHROPLASTY;  Surgeon:  FKerin Salen MD;  [facility]  Service: Orthopedics;  Laterality: Right;   TRANSTHORACIC ECHOCARDIOGRAM  06/2015   EF 60-65%. Mild LA dilation. Normal PA pressures: 32 mmHg. Aortic sclerosis but no stenosis.   wisdom teeth extraccted      Allergies  Allergen Reactions   Crab [Shellfish Allergy] Rash   Codeine    Hydrocodone Hives   Adhesive [Tape] Rash   Oxycodone Rash    Allergies as of 11/17/2021       Reactions   Crab [shellfish Allergy] Rash   Codeine    Hydrocodone Hives   Adhesive [tape] Rash   Oxycodone Rash        Medication List        Accurate as of November 17, 2021 10:25 AM. If you have any questions, ask your nurse or doctor.          acetaminophen 325 MG tablet Commonly known as: TYLENOL Take 650 mg by mouth every 6 (six) hours as needed.   atorvastatin 20 MG tablet Commonly known as: LIPITOR Take 20 mg by mouth daily.   Biofreeze 4 % Gel Generic drug: Menthol (Topical Analgesic) Apply 1 application topically in the morning, at noon, in the evening, and at bedtime. Apply to right great toe   diltiazem 240 MG 24 hr capsule Commonly known as: DILACOR XR Take 240 mg by mouth daily. For heart failure   Eliquis 5 MG Tabs tablet Generic drug: apixaban TAKE 1 TABLET TWICE A DAY (SCHEDULE FOLLOW UP WITH CARDIOLOGIST PRIOR TO NEXT REFILL REQUEST)   escitalopram 10 MG tablet Commonly known as: LEXAPRO Take 10 mg by mouth daily.   esomeprazole 40 MG capsule Commonly known as: NEXIUM TAKE 1 CAPSULE DAILY   furosemide 40 MG tablet Commonly known as: LASIX Take 40 mg by mouth daily.   losartan 25 MG tablet Commonly known as: COZAAR Take 25 mg by mouth daily.   mirabegron ER 25 MG Tb24 tablet Commonly known as: MYRBETRIQ Take 25 mg by mouth daily.   MIRALAX PO Take 17 g by mouth daily.   nitroGLYCERIN 0.4 MG SL tablet Commonly known as: NITROSTAT Place 0.4 mg under the tongue as needed for chest pain.   PreviDent 5000 Booster Plus  1.1 % Pste Generic drug: Sodium Fluoride Place 1 application onto teeth at bedtime.   tamsulosin 0.4 MG Caps capsule Commonly known as: FLOMAX Take 0.4 mg by mouth daily.        Review of Systems  Immunization History  Administered Date(s) Administered   H1N1 04/13/2010   Influenza, High Dose Seasonal PF 02/12/2014, 03/29/2018, 01/05/2019   Influenza-Unspecified 03/06/2013, 02/12/2014, 03/06/2017, 03/17/2020, 03/24/2021   Moderna SARS-COV2 Booster Vaccination 11/03/2020   Moderna Sars-Covid-2 Vaccination 06/08/2019, 07/06/2019, 04/14/2020  PFIZER(Purple Top)SARS-COV-2 Vaccination 02/23/2021   Pneumococcal Conjugate-13 02/12/2014   Pneumococcal Polysaccharide-23 02/04/2013, 02/12/2014   Td 09/04/2005   Tdap 12/11/2012   Zoster Recombinat (Shingrix) 08/27/2021   Zoster, Live 07/07/2006   Pertinent  Health Maintenance Due  Topic Date Due   INFLUENZA VACCINE  01/04/2022      01/30/2019    5:00 PM  Fall Risk  Patient Fall Risk Level Low fall risk   Functional Status Survey:    Vitals:   11/17/21 1019  BP: 118/80  Pulse: 60  Resp: 18  Temp: 97.8 F (36.6 C)  SpO2: 95%  Weight: 204 lb (92.5 kg)  Height: '5\' 7"'$  (1.702 m)   Body mass index is 31.95 kg/m. Physical Exam  Labs reviewed: Recent Labs    04/20/21 0000 10/22/21 0000  NA 138 141  K 4.2 3.8  CL 101 102  CO2 32*  --   BUN 19 22*  CREATININE 0.9 0.8  CALCIUM 8.7  --    Recent Labs    04/20/21 0000  AST 9*  ALT 3*  ALKPHOS 62  ALBUMIN 3.5   Recent Labs    01/12/21 0000  WBC 9.0  NEUTROABS 6,408.00  HGB 14.3  HCT 41  PLT 205   Lab Results  Component Value Date   TSH 3.33 11/11/2020   No results found for: "HGBA1C" Lab Results  Component Value Date   CHOL 124 10/22/2021   HDL 39 10/22/2021   LDLCALC 70 10/22/2021   TRIG 69 10/22/2021   CHOLHDL 2.9 02/12/2014    Significant Diagnostic Results in last 30 days:  No results found.  Assessment/Plan There are no diagnoses  linked to this encounter.   Family/ staff Communication:   Labs/tests ordered:

## 2021-12-08 ENCOUNTER — Encounter: Payer: Self-pay | Admitting: Adult Health

## 2021-12-08 ENCOUNTER — Non-Acute Institutional Stay (SKILLED_NURSING_FACILITY): Payer: Medicare Other | Admitting: Adult Health

## 2021-12-08 DIAGNOSIS — I4819 Other persistent atrial fibrillation: Secondary | ICD-10-CM | POA: Diagnosis not present

## 2021-12-08 DIAGNOSIS — I5030 Unspecified diastolic (congestive) heart failure: Secondary | ICD-10-CM | POA: Diagnosis not present

## 2021-12-08 DIAGNOSIS — F339 Major depressive disorder, recurrent, unspecified: Secondary | ICD-10-CM

## 2021-12-08 DIAGNOSIS — I2510 Atherosclerotic heart disease of native coronary artery without angina pectoris: Secondary | ICD-10-CM

## 2021-12-08 DIAGNOSIS — I10 Essential (primary) hypertension: Secondary | ICD-10-CM

## 2021-12-08 DIAGNOSIS — J849 Interstitial pulmonary disease, unspecified: Secondary | ICD-10-CM | POA: Diagnosis not present

## 2021-12-08 DIAGNOSIS — Z9861 Coronary angioplasty status: Secondary | ICD-10-CM

## 2021-12-08 NOTE — Progress Notes (Signed)
Location:  [place] Room Number: NO/05/A Place of Service:  SNF (31) Provider:  Durenda Age, DNP, FNP-BC  Patient Care Team: Virgie Dad, MD as PCP - General (Internal Medicine) Leonie Man, MD as PCP - Cardiology (Cardiology)  Extended Emergency Contact Information Primary Emergency Contact: [facility] Address: [address]          [place], [place] [address] [name] of [place] Phone: [phone] Mobile Phone: [phone] Relation: Spouse Secondary Emergency Contact: Janey Genta Address: [address]          [place], [place] of [place] Phone: [phone] Mobile Phone: [phone] Relation: Daughter  Code Status:  DNR  Goals of care: Advanced Directive information    12/08/2021    3:26 PM  Advanced Directives  Does Patient Have a Medical Advance Directive? Yes  Type of Paramedic of [place];Living will;Out of facility DNR (pink MOST or yellow form)  Does patient want to make changes to medical advance directive? No - Patient declined  Copy of [place] in Chart? Yes - validated most recent copy scanned in chart (See row information)  Pre-existing out of facility DNR order (yellow form or pink MOST form) Pink MOST form placed in chart (order not valid for inpatient use)     Chief Complaint  Patient presents with   Medical Management of Chronic Issues    Routine Visit     HPI:  Pt is a 88 y.o. male seen today for medical management of chronic diseases. He is a long-term care resident of [facility].  ILD (interstitial lung disease) ([place])  -  no reported SOB, uses O2 @ 2L/min  Essential hypertension  _ SBPs ranging from 101-122, takes losartan 25 mg daily  Diastolic congestive heart failure, unspecified HF chronicity (HCC) -   takes Lasix 40 mg daily  Persistent atrial fibrillation ([place]):  CHA2DS2-VASc Score 4; On Eliquis  -  takes diltiazem 24-hour 240 mg 1 capsule daily and Eliquis 5 mg twice a day  CAD S/P percutaneous coronary angioplasty -   no reported chest pain, takes NTG as needed and atorvastatin 20 mg daily  Depression, recurrent (HCC) -   PHQ-9 score 0, takes Lexapro 10 mg daily    Past Medical History:  Diagnosis Date   Anxiety    CAD S/P percutaneous coronary angioplasty 1996, 2009, 01/2010   PTCA of L Cx 1996; BMS-RCA [facility]) 2009; Staged PCI RCA (70% ISR) --> 3.0 mm x 23 mm BMS, staged PCI Diag - 2.0 mm x 12 mm Mini Vision BMS (2.25 mm)   Cataracts, bilateral    immature   Chronic back pain    scoliosis--pt states unable to have surgery bc of age   GERD (gastroesophageal reflux disease)    takes Nexium daily   History of colon polyps    Hyperlipidemia    Hypertension    Myocardial infarction ([place]) 1996, 2009   x 2;last time was about 8-103yr ago    Obesity (BMI 30.0-34.9)    OSA (obstructive sleep apnea)    Osteoarthritis of both knees    Peripheral edema    Venous Stasis   Persistent atrial fibrillation ([place]    Unable to maintaine NSR after DCCV with Tikosyn.  Plan = rate control w/diltiazem (beta blocker off b/c fatigue). CHA2DS2Vasc 4. DOAC - Eliquis.    [place]state cancer ([place] 2005   seed implant   Past Surgical History:  Procedure Laterality Date   CARDIOVERSION N/A 06/12/2015   Procedure: CARDIOVERSION;  Surgeon: Sanda Klein, MD;  Location: [place] [facility];  Service: Cardiovascular;  Laterality: N/A;   CARDIOVERSION N/A 08/02/2015   Procedure: CARDIOVERSION;  Surgeon: [name], MD;  Location: [place];  Service: Cardiovascular;  Laterality: N/A;   [place], 2009, August 2011   PTCA of L Cx 1996; BMS-RCA [facility]) 2009; Staged PCI RCA (70% ISR) --> 3.0 mm x 23 mm BMS, staged PCI Diag - 2.0 mm x 12 mm Mini Vision BMS (2.25 mm)   cyst removed  in college   NEUROPLASTY / TRANSPOSITION MEDIAN NERVE AT  CARPAL TUNNEL BILATERAL     NM MYOVIEW LTD  November 2013   EF 53%; fixed mid inferior-inferolateral defect, infarct with no ischemia.   right knee arthrsoscopy  58yr ago   right trigger finger     seeds placed into prostate   2005   TEE WITHOUT CARDIOVERSION N/A 06/12/2015   Procedure: TRANSESOPHAGEAL ECHOCARDIOGRAM (TEE) - WITH CARDIOVERSION;  Surgeon: MSanda Klein MD;  [facility]  Service: Cardiovascular: EF 55-60%.No LA or LAA thrombus. Normal Peal PAP ~33 mmHg.   TOTAL KNEE ARTHROPLASTY  05/09/2012   Procedure: TOTAL KNEE ARTHROPLASTY;  Surgeon: FKerin Salen MD;  [facility]  Service: Orthopedics;  Laterality: Right;   TRANSTHORACIC ECHOCARDIOGRAM  06/2015   EF 60-65%. Mild LA dilation. Normal PA pressures: 32 mmHg. Aortic sclerosis but no stenosis.   wisdom teeth extraccted      Allergies  Allergen Reactions   Crab [Shellfish Allergy] Rash   Codeine    Hydrocodone Hives   Adhesive [Tape] Rash   Oxycodone Rash    Outpatient Encounter Medications as of 12/08/2021  Medication Sig   acetaminophen (TYLENOL) 325 MG tablet Take 650 mg by mouth every 6 (six) hours as needed.   atorvastatin (LIPITOR) 20 MG tablet Take 20 mg by mouth daily.   diltiazem (DILACOR XR) 240 MG 24 hr capsule Take 240 mg by mouth daily. For heart failure   ELIQUIS 5 MG TABS tablet TAKE 1 TABLET TWICE A DAY (SCHEDULE FOLLOW UP WITH CARDIOLOGIST PRIOR TO NEXT REFILL REQUEST)   escitalopram (LEXAPRO) 10 MG tablet Take 10 mg by mouth daily.   esomeprazole (NEXIUM) 40 MG capsule TAKE 1 CAPSULE DAILY   furosemide (LASIX) 40 MG tablet Take 40 mg by mouth daily.   losartan (COZAAR) 25 MG tablet Take 25 mg by mouth daily.   Menthol, Topical Analgesic, (BIOFREEZE) 4 % GEL Apply 1 application topically in the morning, at noon, in the evening, and at bedtime. Apply to right great toe   mirabegron ER (MYRBETRIQ) 25 MG TB24 tablet Take 25 mg by mouth daily.    nitroGLYCERIN (NITROSTAT) 0.4 MG SL tablet  Place 0.4 mg under the tongue as needed for chest pain.   Polyethylene Glycol 3350 (MIRALAX PO) Take 17 g by mouth daily.   Sodium Fluoride (PREVIDENT 5000 BOOSTER PLUS) 1.1 % PSTE Place 1 application onto teeth at bedtime.   tamsulosin (FLOMAX) 0.4 MG CAPS capsule Take 0.4 mg by mouth daily.    No facility-administered encounter medications on file as of 12/08/2021.    Review of Systems  Constitutional:  Negative for activity change, appetite change and fever.  HENT:  Negative for sore throat.   Eyes: Negative.   Cardiovascular:  Negative for chest pain and leg swelling.  Gastrointestinal:  Negative for abdominal distention, diarrhea and vomiting.  Genitourinary:  Negative for dysuria, frequency and urgency.  Skin:  Negative for color change.  Neurological:  Negative for dizziness and headaches.  Psychiatric/Behavioral:  Negative for behavioral problems and sleep disturbance. The patient is not nervous/anxious.        Immunization History  Administered Date(s) Administered   H1N1 04/13/2010   Influenza, High Dose Seasonal PF 02/12/2014, 03/29/2018, 01/05/2019   Influenza-Unspecified 03/06/2013, 02/12/2014, 03/06/2017, 03/17/2020, 03/24/2021   Moderna SARS-COV2 Booster Vaccination 11/03/2020   Moderna Sars-Covid-2 Vaccination 06/08/2019, 07/06/2019, 04/14/2020   PFIZER(Purple Top)SARS-COV-2 Vaccination 02/23/2021   Pneumococcal Conjugate-13 02/12/2014   Pneumococcal Polysaccharide-23 02/04/2013, 02/12/2014   Td 09/04/2005   Tdap 12/11/2012   Zoster Recombinat (Shingrix) 08/27/2021   Zoster, Live 07/07/2006   Pertinent  Health Maintenance Due  Topic Date Due   INFLUENZA VACCINE  01/04/2022      01/30/2019    5:00 PM  Fall Risk  Patient Fall Risk Level Low fall risk     Vitals:   12/08/21 1527  BP: 106/77  Pulse: 70  Resp: 20  Temp: (!) 97.4 F (36.3 C)  SpO2: 92%  Weight: 197 lb 6.4 oz (89.5 kg)  Height: '5\' 7"'$  (1.702 m)   Body mass index is 30.92  kg/m.  Physical Exam Constitutional:      General: He is not in acute distress.    Appearance: He is obese.  HENT:     Head: Normocephalic and atraumatic.     Mouth/Throat:     Mouth: Mucous membranes are moist.  Eyes:     Conjunctiva/sclera: Conjunctivae normal.  Cardiovascular:     Rate and Rhythm: Normal rate and regular rhythm.     Pulses: Normal pulses.     Heart sounds: Normal heart sounds.  Pulmonary:     Effort: Pulmonary effort is normal.     Breath sounds: Normal breath sounds.  Abdominal:     General: Bowel sounds are normal.     Palpations: Abdomen is soft.  Musculoskeletal:        General: No swelling. Normal range of motion.     Cervical back: Normal range of motion.  Skin:    General: Skin is warm and dry.  Neurological:     Mental Status: He is alert. Mental status is at baseline.     Comments: Alert to self, disoriented to time and place  Psychiatric:        Mood and Affect: Mood normal.        Behavior: Behavior normal.        Labs reviewed: Recent Labs    04/20/21 0000 10/22/21 0000  NA 138 141  K 4.2 3.8  CL 101 102  CO2 32*  --   BUN 19 22*  CREATININE 0.9 0.8  CALCIUM 8.7  --    Recent Labs    04/20/21 0000  AST 9*  ALT 3*  ALKPHOS 62  ALBUMIN 3.5   Recent Labs    01/12/21 0000  WBC 9.0  NEUTROABS 6,408.00  HGB 14.3  HCT 41  PLT 205   Lab Results  Component Value Date   TSH 3.33 11/11/2020   No results found for: "HGBA1C" Lab Results  Component Value Date   CHOL 124 10/22/2021   HDL 39 10/22/2021   LDLCALC 70 10/22/2021   TRIG 69 10/22/2021   CHOLHDL 2.9 02/12/2014    Significant Diagnostic Results in last 30 days:  No results found.  Assessment/Plan  1. ILD (interstitial lung disease) ([place]) -  stable, no SOB -  remains on continuous O2 at 2 L/minute via [place]  2. Essential hypertension -Blood pressure well controlled Continue current medications  3. Diastolic congestive heart failure, unspecified HF  chronicity (HCC) Stable, continue Lasix  4. Persistent atrial fibrillation ([place]):  CHA2DS2-VASc Score 4; On Eliquis -   Rate controlled, continue diltiazem 24-hour for rate control and Eliquis for anticoagulation  5. CAD S/P percutaneous coronary angioplasty -   No reported chest pains, continue NTG as needed and atorvastatin  6. Depression, recurrent ([place]) -    PHQ-9 score 2, no mild depressed mood -    Continue Lexapro   Family/ staff Communication: Discussed plan of care with resident and charge nurse.  Labs/tests ordered:   None    Durenda Age, DNP, MSN, FNP-BC [facility] [phone] (Monday-Friday 8:00 a.m. - 5:00 p.m.) [phone] (after hours)

## 2021-12-17 ENCOUNTER — Other Ambulatory Visit: Payer: Self-pay | Admitting: Adult Health

## 2021-12-17 MED ORDER — ALPRAZOLAM 0.5 MG PO TABS: 0.5000 mg | ORAL_TABLET | Freq: Two times a day (BID) | ORAL | 0 refills | Status: AC | PRN

## 2022-01-12 ENCOUNTER — Encounter: Payer: Self-pay | Admitting: Nurse Practitioner

## 2022-01-12 ENCOUNTER — Non-Acute Institutional Stay (SKILLED_NURSING_FACILITY): Payer: Medicare Other | Admitting: Nurse Practitioner

## 2022-01-12 DIAGNOSIS — J849 Interstitial pulmonary disease, unspecified: Secondary | ICD-10-CM

## 2022-01-12 DIAGNOSIS — G4733 Obstructive sleep apnea (adult) (pediatric): Secondary | ICD-10-CM

## 2022-01-12 DIAGNOSIS — G3184 Mild cognitive impairment, so stated: Secondary | ICD-10-CM

## 2022-01-12 DIAGNOSIS — M1711 Unilateral primary osteoarthritis, right knee: Secondary | ICD-10-CM

## 2022-01-12 DIAGNOSIS — I10 Essential (primary) hypertension: Secondary | ICD-10-CM

## 2022-01-12 DIAGNOSIS — N3942 Incontinence without sensory awareness: Secondary | ICD-10-CM

## 2022-01-12 DIAGNOSIS — I5030 Unspecified diastolic (congestive) heart failure: Secondary | ICD-10-CM | POA: Diagnosis not present

## 2022-01-12 DIAGNOSIS — E785 Hyperlipidemia, unspecified: Secondary | ICD-10-CM

## 2022-01-12 DIAGNOSIS — I4819 Other persistent atrial fibrillation: Secondary | ICD-10-CM

## 2022-01-12 DIAGNOSIS — F419 Anxiety disorder, unspecified: Secondary | ICD-10-CM | POA: Diagnosis not present

## 2022-01-12 DIAGNOSIS — D509 Iron deficiency anemia, unspecified: Secondary | ICD-10-CM

## 2022-01-12 DIAGNOSIS — K219 Gastro-esophageal reflux disease without esophagitis: Secondary | ICD-10-CM

## 2022-01-12 DIAGNOSIS — K5900 Constipation, unspecified: Secondary | ICD-10-CM

## 2022-01-12 NOTE — Assessment & Plan Note (Signed)
takes Atorvastatin, LDL 70 10/22/21 

## 2022-01-12 NOTE — Assessment & Plan Note (Signed)
stable, on MiraLax 

## 2022-01-12 NOTE — Assessment & Plan Note (Signed)
SNF [facility] for supportive care.  

## 2022-01-12 NOTE — Progress Notes (Unsigned)
Location:  [facility] Room Number: NO/05/A Place of Service:  SNF (31) Provider:  Dorotha Hirschi X, NP Virgie Dad, MD  Patient Care Team: Virgie Dad, MD as PCP - General (Internal Medicine) Leonie Liviah Cake, MD as PCP - Cardiology (Cardiology)  Extended Emergency Contact Information Primary Emergency Contact: [facility] Address: [address]          [place], [place] [address] [name] of [place] Phone: [phone] Mobile Phone: [phone] Relation: Spouse Secondary Emergency Contact: Janey Genta Address: [address]          [place], [place] Phone: [phone] Mobile Phone: [phone] Relation: Daughter  Code Status:  DNR Goals of care: Advanced Directive information    01/12/2022    9:14 AM  Advanced Directives  Does Patient Have a Medical Advance Directive? Yes  Type of Paramedic of [place];Living will;Out of facility DNR (pink MOST or yellow form)  Does patient want to make changes to medical advance directive? No - Patient declined  Copy of [place] in Chart? Yes - validated most recent copy scanned in chart (See row information)  Pre-existing out of facility DNR order (yellow form or pink MOST form) Pink MOST form placed in chart (order not valid for inpatient use)     Chief Complaint  Patient presents with   Medical Management of Chronic Issues    Patient is here for a follow up for chronic conditions .patient is also due for 2nd shingrix and flu vaccine    HPI:  Pt is a 88 y.o. male seen today for managing chronic medical conditions.   Mild cognitive impairment, SNF [facility] for supportive care.  Urinary incontinent, no urinary retention, on Mirabegron, Tamsulosin              Constipation, stable, on MiraLax             OA, uses mechanical lift for transfer, takes prn Tylenol.             Hx of Interstitial lung disease, O2 dependent,  no further f/u Pulmonology             CHF/minimal edema BLE, on Furosamide. Bun/creat 22/0.8 10/22/21 Anxiety/depression, stable, on Lexapro.              Afib, heart rate is in control, on Diltiazem, Eliquis, TSH 3.33 11/11/20             GERD, stable, on Esomeprazole             Anemia, stable, off Fe. Hgb 14.3 01/12/21             HTN, on Losartan, Diltiazem, Furosemide, Bun/creat 19/0.85 04/20/21             OSA refused CPAP             Hyperlipidemia, takes Atorvastatin, LDL 70 10/22/21  Past Medical History:  Diagnosis Date   Anxiety    CAD S/P percutaneous coronary angioplasty 1996, 2009, 01/2010   PTCA of L Cx 1996; BMS-RCA [facility]) 2009; Staged PCI RCA (70% ISR) --> 3.0 mm x 23 mm BMS, staged PCI Diag - 2.0 mm x 12 mm Mini Vision BMS (2.25 mm)   Cataracts, bilateral    immature   Chronic back pain    scoliosis--pt states unable to have surgery bc of age   GERD (gastroesophageal reflux disease)    takes Nexium daily  History of colon polyps    Hyperlipidemia    Hypertension    Myocardial infarction ([place]) 1996, 2009   x 2;last time was about 8-43yr ago    Obesity (BMI 30.0-34.9)    OSA (obstructive sleep apnea)    Osteoarthritis of both knees    Peripheral edema    Venous Stasis   Persistent atrial fibrillation ([place]    Unable to maintaine NSR after DCCV with Tikosyn.  Plan = rate control w/diltiazem (beta blocker off b/c fatigue). CHA2DS2Vasc 4. DOAC - Eliquis.    Prostate cancer ([place] 2005   seed implant   Past Surgical History:  Procedure Laterality Date   CARDIOVERSION N/A 06/12/2015   Procedure: CARDIOVERSION;  Surgeon: MSanda Klein MD;  [facility];  Service: Cardiovascular;  Laterality: N/A;   CARDIOVERSION N/A 08/02/2015   Procedure: CARDIOVERSION;  Surgeon: JThompson Grayer MD;  [facility]  Service: Cardiovascular;  Laterality: N/A;   [place] 2009, August 2011   PTCA of L  Cx 1996; BMS-RCA ([facility] 2009; Staged PCI RCA (70% ISR) --> 3.0 mm x 23 mm BMS, staged PCI Diag - 2.0 mm x 12 mm Mini Vision BMS (2.25 mm)   cyst removed  in college   NEUROPLASTY / TRANSPOSITION MEDIAN NERVE AT CARPAL TUNNEL BILATERAL     NM MYOVIEW LTD  November 2013   EF 53%; fixed mid inferior-inferolateral defect, infarct with no ischemia.   right knee arthrsoscopy  [place]   right trigger finger     seeds placed into prostate   2005   TEE WITHOUT CARDIOVERSION N/A 06/12/2015   Procedure: TRANSESOPHAGEAL ECHOCARDIOGRAM (TEE) - WITH CARDIOVERSION;  Surgeon: MiSanda KleinMD;  Location: [facility] Service: Cardiovascular: EF 55-60%.No LA or LAA thrombus. Normal Peal PAP ~33 mmHg.   TOTAL KNEE ARTHROPLASTY  05/09/2012   Procedure: TOTAL KNEE ARTHROPLASTY;  Surgeon: FrKerin SalenMD;  [facility] Service: Orthopedics;  Laterality: Right;   TRANSTHORACIC ECHOCARDIOGRAM  06/2015   EF 60-65%. Mild LA dilation. Normal PA pressures: 32 mmHg. Aortic sclerosis but no stenosis.   wisdom teeth extraccted      Allergies  Allergen Reactions   Crab [Shellfish Allergy] Rash   Codeine    Hydrocodone Hives   Adhesive [Tape] Rash   Oxycodone Rash    Outpatient Encounter Medications as of 01/12/2022  Medication Sig   acetaminophen (TYLENOL) 325 MG tablet Take 650 mg by mouth every 6 (six) hours as needed.   atorvastatin (LIPITOR) 20 MG tablet Take 20 mg by mouth daily.   diltiazem (DILACOR XR) 240 MG 24 hr capsule Take 240 mg by mouth daily. For heart failure   ELIQUIS 5 MG TABS tablet TAKE 1 TABLET TWICE A DAY (SCHEDULE FOLLOW UP WITH CARDIOLOGIST PRIOR TO NEXT REFILL REQUEST)   escitalopram (LEXAPRO) 10 MG tablet Take 10 mg by mouth daily.   esomeprazole (NEXIUM) 40 MG capsule TAKE 1 CAPSULE DAILY   furosemide (LASIX) 40 MG tablet Take 40 mg by mouth daily.   losartan (COZAAR) 25 MG tablet Take 25 mg by mouth daily.   Menthol, Topical Analgesic, (BIOFREEZE) 4 % GEL Apply 1 application  topically in the morning, at noon, in the evening, and at bedtime. Apply to right great toe   mirabegron ER (MYRBETRIQ) 25 MG TB24 tablet Take 25 mg by mouth daily.    nitroGLYCERIN (NITROSTAT) 0.4 MG SL tablet  Place 0.4 mg under the tongue as needed for chest pain.   Polyethylene Glycol 3350 (MIRALAX PO) Take 17 g by mouth daily.   Sodium Fluoride (PREVIDENT 5000 BOOSTER PLUS) 1.1 % PSTE Place 1 application onto teeth at bedtime.   tamsulosin (FLOMAX) 0.4 MG CAPS capsule Take 0.4 mg by mouth daily.    No facility-administered encounter medications on file as of 01/12/2022.    Review of Systems  Constitutional:  Positive for unexpected weight change. Negative for fatigue and fever.       Gradual weight loss #2Ibs last month, dietary f/u.   HENT:  Positive for hearing loss. Negative for congestion and trouble swallowing.   Eyes:  Negative for visual disturbance.  Respiratory:  Positive for shortness of breath. Negative for cough.        O2 dependent. DOE  Cardiovascular:  Negative for leg swelling.  Gastrointestinal:  Negative for abdominal pain and constipation.       Incontinent of feces.   Genitourinary:  Positive for frequency. Negative for dysuria and urgency.       Incontinent of urine.   Musculoskeletal:  Positive for arthralgias and gait problem.       Left knee pain is chronic.   Skin:  Negative for color change.  Neurological:  Negative for speech difficulty, weakness and headaches.       Memory lapses.   Psychiatric/Behavioral:  Negative for behavioral problems and sleep disturbance. The patient is nervous/anxious.        Anxious at times.     Immunization History  Administered Date(s) Administered   H1N1 04/13/2010   Influenza, High Dose Seasonal PF 02/12/2014, 03/29/2018, 01/05/2019   Influenza-Unspecified 03/06/2013, 02/12/2014, 03/06/2017, 03/17/2020, 03/24/2021   Moderna SARS-COV2 Booster Vaccination 11/03/2020   Moderna Sars-Covid-2 Vaccination 06/08/2019,  07/06/2019, 04/14/2020   PFIZER(Purple Top)SARS-COV-2 Vaccination 02/23/2021   Pneumococcal Conjugate-13 02/12/2014   Pneumococcal Polysaccharide-23 02/04/2013, 02/12/2014   Td 09/04/2005   Tdap 12/11/2012   Zoster Recombinat (Shingrix) 08/27/2021   Zoster, Live 07/07/2006   Pertinent  Health Maintenance Due  Topic Date Due   INFLUENZA VACCINE  01/04/2022      01/30/2019    5:00 PM  Fall Risk  Patient Fall Risk Level Low fall risk   Functional Status Survey:    Vitals:   01/12/22 0915  BP: 104/78  Pulse: 78  Resp: 20  Temp: 98.2 F (36.8 C)  SpO2: 92%  Weight: 195 lb (88.5 kg)  Height: '5\' 7"'$  (1.702 m)   Body mass index is 30.54 kg/m. Physical Exam Vitals and nursing note reviewed.  Constitutional:      Appearance: Normal appearance.  HENT:     Head: Normocephalic and atraumatic.  Eyes:     Extraocular Movements: Extraocular movements intact.     Conjunctiva/sclera: Conjunctivae normal.     Pupils: Pupils are equal, round, and reactive to light.  Cardiovascular:     Rate and Rhythm: Normal rate. Rhythm irregular.     Heart sounds: No murmur heard. Pulmonary:     Breath sounds: No wheezing.     Comments: O2 dependent.  Abdominal:     General: Bowel sounds are normal.     Palpations: Abdomen is soft.     Tenderness: There is no abdominal tenderness.  Musculoskeletal:     Cervical back: Normal range of motion and neck supple.     Right lower leg: No edema.     Left lower leg: No edema.     Comments: Mechanical  lift for transfer.   Skin:    General: Skin is warm and dry.  Neurological:     General: No focal deficit present.     Mental Status: He is alert and oriented to person, place, and time. Mental status is at baseline.     Gait: Gait abnormal.  Psychiatric:        Mood and Affect: Mood normal.        Behavior: Behavior normal.        Thought Content: Thought content normal.     Labs reviewed: Recent Labs    04/20/21 0000 10/22/21 0000  NA  138 141  K 4.2 3.8  CL 101 102  CO2 32*  --   BUN 19 22*  CREATININE 0.9 0.8  CALCIUM 8.7  --    Recent Labs    04/20/21 0000  AST 9*  ALT 3*  ALKPHOS 62  ALBUMIN 3.5   No results for input(s): "WBC", "NEUTROABS", "HGB", "HCT", "MCV", "PLT" in the last 8760 hours. Lab Results  Component Value Date   TSH 3.33 11/11/2020   No results found for: "HGBA1C" Lab Results  Component Value Date   CHOL 124 10/22/2021   HDL 39 10/22/2021   LDLCALC 70 10/22/2021   TRIG 69 10/22/2021   CHOLHDL 2.9 02/12/2014    Significant Diagnostic Results in last 30 days:  No results found.  Assessment/Plan Osteoarthritis of right knee uses mechanical lift for transfer, takes prn Tylenol.  ILD (interstitial lung disease) (HCC) Hx of Interstitial lung disease, O2 dependent, no further f/u Pulmonology  Diastolic CHF ([place]) CHF/minimal edema BLE, on Furosamide. Bun/creat 22/0.8 10/22/21  Anxiety stable, on Lexapro.   Persistent atrial fibrillation ([place]):  CHA2DS2-VASc Score 4; On Eliquis heart rate is in control, on Diltiazem, Eliquis, TSH 3.33 11/11/20  GERD (gastroesophageal reflux disease) stable, on Esomeprazole  Iron deficiency anemia stable, off Fe. Hgb 14.3 01/12/21  Essential hypertension on Losartan, Diltiazem, Furosemide, Bun/creat 19/0.85 04/20/21  OSA (obstructive sleep apnea) refused CPAP  Hyperlipidemia with target LDL less than 70 takes Atorvastatin, LDL 70 10/22/21  MCI (mild cognitive impairment) with memory loss SNF [facility] for supportive care.   Urinary incontinence no urinary retention, on Mirabegron, Tamsulosin   Constipation  stable, on MiraLax    Family/ staff Communication: plan of care reviewed with the patient and charge nurse.   Labs/tests ordered:  none  Time spend 35 minutes.

## 2022-01-12 NOTE — Assessment & Plan Note (Signed)
on Losartan, Diltiazem, Furosemide, Bun/creat 19/0.85 04/20/21

## 2022-01-12 NOTE — Assessment & Plan Note (Signed)
stable, on Lexapro.  

## 2022-01-12 NOTE — Assessment & Plan Note (Signed)
heart rate is in control, on Diltiazem, Eliquis, TSH 3.33 11/11/20

## 2022-01-12 NOTE — Assessment & Plan Note (Signed)
stable, off Fe. Hgb 14.3 01/12/21

## 2022-01-12 NOTE — Assessment & Plan Note (Signed)
CHF/minimal edema BLE, on Furosamide. Bun/creat 22/0.8 10/22/21

## 2022-01-12 NOTE — Assessment & Plan Note (Signed)
no urinary retention, on Mirabegron, Tamsulosin  

## 2022-01-12 NOTE — Assessment & Plan Note (Signed)
refused CPAP 

## 2022-01-12 NOTE — Assessment & Plan Note (Signed)
uses mechanical lift for transfer, takes prn Tylenol. 

## 2022-01-12 NOTE — Assessment & Plan Note (Signed)
stable, on Esomeprazole 

## 2022-01-12 NOTE — Assessment & Plan Note (Signed)
Hx of Interstitial lung disease, O2 dependent, no further f/u Pulmonology 

## 2022-02-08 ENCOUNTER — Non-Acute Institutional Stay (SKILLED_NURSING_FACILITY): Payer: Medicare Other | Admitting: Family Medicine

## 2022-02-08 ENCOUNTER — Encounter: Payer: Self-pay | Admitting: Family Medicine

## 2022-02-08 DIAGNOSIS — I2510 Atherosclerotic heart disease of native coronary artery without angina pectoris: Secondary | ICD-10-CM

## 2022-02-08 DIAGNOSIS — I10 Essential (primary) hypertension: Secondary | ICD-10-CM | POA: Diagnosis not present

## 2022-02-08 DIAGNOSIS — J849 Interstitial pulmonary disease, unspecified: Secondary | ICD-10-CM

## 2022-02-08 DIAGNOSIS — J9611 Chronic respiratory failure with hypoxia: Secondary | ICD-10-CM

## 2022-02-08 DIAGNOSIS — G3184 Mild cognitive impairment, so stated: Secondary | ICD-10-CM

## 2022-02-08 DIAGNOSIS — I4819 Other persistent atrial fibrillation: Secondary | ICD-10-CM

## 2022-02-08 DIAGNOSIS — Z9861 Coronary angioplasty status: Secondary | ICD-10-CM

## 2022-02-08 NOTE — Progress Notes (Unsigned)
Location:  [facility] Room Number: 05-A Place of Service:  SNF [phone]) Provider:  Lillette Boxer. Rinaldo Cloud, MD  Patient Care Team: Virgie Dad, MD as PCP - General (Internal Medicine) Leonie Man, MD as PCP - Cardiology (Cardiology)  Extended Emergency Contact Information Primary Emergency Contact: [facility] Address: [address]          [place], [place] [address] [name] of [place] Phone: [phone] Mobile Phone: [phone] Relation: Spouse Secondary Emergency Contact: Janey Genta Address: [place], [place] Phone: [phone] Mobile Phone: [phone] Relation: Daughter  Code Status:  DNR Goals of care: Advanced Directive information    02/08/2022    8:44 AM  Advanced Directives  Does Patient Have a Medical Advance Directive? Yes  Type of Paramedic of [place];Living will;Out of facility DNR (pink MOST or yellow form)  Does patient want to make changes to medical advance directive? No - Patient declined  Copy of [place] in Chart? Yes - validated most recent copy scanned in chart (See row information)  Pre-existing out of facility DNR order (yellow form or pink MOST form) Pink MOST/Yellow Form most recent copy in chart - Physician notified to receive inpatient order     Chief Complaint  Patient presents with   Routine    HPI:  Pt is a 88 y.o. male seen today for medical management of chronic diseases.: Interstitial lung disease on chronic oxygen which she sometimes refuses to wear.  There have been some times when he went without oxygen and became drowsy.  He also has a history of obstructive sleep apnea but does not use his CPAP. There is a history of chronic back pain with spinal stenosis.  There is a history of cognitive impairment that historically is declining although today he answers questions  appropriately for the most part He also has a history of coronary disease and is status post PTCA.  Has chronic atrial fibrillation on Eliquis He has no specific complaints today.  He spends most of the time in his room.  He is however dressed but lying in bed.  No problems reported with appetite, bowel or bladder function, or any recent pain.   Past Medical History:  Diagnosis Date   Anxiety    CAD S/P percutaneous coronary angioplasty 1996, 2009, 01/2010   PTCA of L Cx 1996; BMS-RCA [facility]) 2009; Staged PCI RCA (70% ISR) --> 3.0 mm x 23 mm BMS, staged PCI Diag - 2.0 mm x 12 mm Mini Vision BMS (2.25 mm)   Cataracts, bilateral    immature   Chronic back pain    scoliosis--pt states unable to have surgery bc of age   GERD (gastroesophageal reflux disease)    takes Nexium daily   History of colon polyps    Hyperlipidemia    Hypertension    Myocardial infarction ([place]) 1996, 2009   x 2;last time was about 8-59yr ago    Obesity (BMI 30.0-34.9)    OSA (obstructive sleep apnea)    Osteoarthritis of both knees    Peripheral edema    Venous Stasis   Persistent atrial fibrillation ([place]    Unable to maintaine NSR after DCCV with Tikosyn.  Plan = rate control w/diltiazem (beta blocker off b/c fatigue). CHA2DS2Vasc 4. DOAC - Eliquis.    [place]state cancer ([place] 2005   seed implant  Past Surgical History:  Procedure Laterality Date   CARDIOVERSION N/A 06/12/2015   Procedure: CARDIOVERSION;  Surgeon: Sanda Klein, MD;  Location: [place] [facility];  Service: Cardiovascular;  Laterality: N/A;   CARDIOVERSION N/A 08/02/2015   Procedure: CARDIOVERSION;  Surgeon: [name], MD;  Location: [facility];  Service: Cardiovascular;  Laterality: N/A;   [place], 2009, August 2011   PTCA of L Cx 1996; BMS-RCA [facility]) 2009; Staged PCI RCA (70% ISR) --> 3.0 mm x 23 mm BMS, staged PCI Diag - 2.0 mm x 12 mm Mini Vision BMS (2.25 mm)   cyst  removed  in college   NEUROPLASTY / TRANSPOSITION MEDIAN NERVE AT CARPAL TUNNEL BILATERAL     NM MYOVIEW LTD  November 2013   EF 53%; fixed mid inferior-inferolateral defect, infarct with no ischemia.   right knee arthrsoscopy  6yr ago   right trigger finger     seeds placed into prostate   2005   TEE WITHOUT CARDIOVERSION N/A 06/12/2015   Procedure: TRANSESOPHAGEAL ECHOCARDIOGRAM (TEE) - WITH CARDIOVERSION;  Surgeon: MSanda Klein MD;  [facility]: [facility]  Service: Cardiovascular: EF 55-60%.No LA or LAA thrombus. Normal Peal PAP ~33 mmHg.   TOTAL KNEE ARTHROPLASTY  05/09/2012   Procedure: TOTAL KNEE ARTHROPLASTY;  Surgeon: FKerin Salen MD;  [facility]  Service: Orthopedics;  Laterality: Right;   TRANSTHORACIC ECHOCARDIOGRAM  06/2015   EF 60-65%. Mild LA dilation. Normal PA pressures: 32 mmHg. Aortic sclerosis but no stenosis.   wisdom teeth extraccted      Allergies  Allergen Reactions   Crab [Shellfish Allergy] Rash   Codeine    Hydrocodone Hives   Adhesive [Tape] Rash   Oxycodone Rash    Outpatient Encounter Medications as of 02/08/2022  Medication Sig   acetaminophen (TYLENOL) 325 MG tablet Take 650 mg by mouth every 6 (six) hours as needed.   atorvastatin (LIPITOR) 20 MG tablet Take 20 mg by mouth daily.   diltiazem (DILACOR XR) 240 MG 24 hr capsule Take 240 mg by mouth daily. For heart failure   ELIQUIS 5 MG TABS tablet TAKE 1 TABLET TWICE A DAY (SCHEDULE FOLLOW UP WITH CARDIOLOGIST PRIOR TO NEXT REFILL REQUEST)   escitalopram (LEXAPRO) 10 MG tablet Take 10 mg by mouth daily.   esomeprazole (NEXIUM) 40 MG capsule TAKE 1 CAPSULE DAILY   furosemide (LASIX) 40 MG tablet Take 40 mg by mouth daily.   losartan (COZAAR) 25 MG tablet Take 25 mg by mouth daily.   Menthol, Topical Analgesic, (BIOFREEZE) 4 % GEL Apply 1 application topically in the morning, at noon, in the evening, and at bedtime. Apply to right great toe   mirabegron ER (MYRBETRIQ) 25 MG TB24 tablet Take 25  mg by mouth daily.    nitroGLYCERIN (NITROSTAT) 0.4 MG SL tablet Place 0.4 mg under the tongue as needed for chest pain.   Polyethylene Glycol 3350 (MIRALAX PO) Take 17 g by mouth daily.   Sodium Fluoride (PREVIDENT 5000 BOOSTER PLUS) 1.1 % PSTE Place 1 application onto teeth at bedtime.   tamsulosin (FLOMAX) 0.4 MG CAPS capsule Take 0.4 mg by mouth daily.    No facility-administered encounter medications on file as of 02/08/2022.    Review of Systems  Constitutional: Negative.   Respiratory:  Positive for shortness of breath.   Cardiovascular: Negative.   Gastrointestinal: Negative.   Genitourinary: Negative.   Musculoskeletal:  Positive for back  pain.  Psychiatric/Behavioral:  Positive for agitation and decreased concentration.   All other systems reviewed and are negative.   Immunization History  Administered Date(s) Administered   H1N1 04/13/2010   Influenza, High Dose Seasonal PF 02/12/2014, 03/29/2018, 01/05/2019   Influenza-Unspecified 03/06/2013, 02/12/2014, 03/06/2017, 03/17/2020, 03/24/2021   Moderna SARS-COV2 Booster Vaccination 11/03/2020, 10/22/2021   Moderna Sars-Covid-2 Vaccination 06/08/2019, 07/06/2019, 04/14/2020   PFIZER(Purple Top)SARS-COV-2 Vaccination 02/23/2021   Pneumococcal Conjugate-13 02/12/2014   Pneumococcal Polysaccharide-23 02/04/2013, 02/12/2014   Td 09/04/2005   Tdap 12/11/2012   Zoster Recombinat (Shingrix) 08/27/2021   Zoster, Live 07/07/2006   Pertinent  Health Maintenance Due  Topic Date Due   INFLUENZA VACCINE  01/04/2022      01/30/2019    5:00 PM  Fall Risk  Patient Fall Risk Level Low fall risk   Functional Status Survey:    Vitals:   02/08/22 0838  BP: 112/76  Pulse: 65  Resp: 20  Temp: 97.8 F (36.6 C)  SpO2: 93%  Weight: 195 lb 4.8 oz (88.6 kg)  Height: '5\' 7"'$  (1.702 m)   Body mass index is 30.59 kg/m. Physical Exam Vitals and nursing note reviewed.  Constitutional:      Appearance: He is obese.  HENT:     Head:  Normocephalic.  Cardiovascular:     Rate and Rhythm: Normal rate. Rhythm irregular.     Heart sounds: Normal heart sounds.  Pulmonary:     Effort: Pulmonary effort is normal.     Breath sounds: Normal breath sounds.     Comments: On oxygen by nasal cannula Abdominal:     General: Abdomen is flat. Bowel sounds are normal.     Palpations: Abdomen is soft.  Skin:    Findings: Bruising present.  Neurological:     General: No focal deficit present.     Mental Status: He is alert and oriented to person, place, and time.  Psychiatric:        Mood and Affect: Mood normal.        Behavior: Behavior normal.     Labs reviewed: Recent Labs    04/20/21 0000 10/22/21 0000  NA 138 141  K 4.2 3.8  CL 101 102  CO2 32*  --   BUN 19 22*  CREATININE 0.9 0.8  CALCIUM 8.7  --    Recent Labs    04/20/21 0000  AST 9*  ALT 3*  ALKPHOS 62  ALBUMIN 3.5   No results for input(s): "WBC", "NEUTROABS", "HGB", "HCT", "MCV", "PLT" in the last 8760 hours. Lab Results  Component Value Date   TSH 3.33 11/11/2020   No results found for: "HGBA1C" Lab Results  Component Value Date   CHOL 124 10/22/2021   HDL 39 10/22/2021   LDLCALC 70 10/22/2021   TRIG 69 10/22/2021   CHOLHDL 2.9 02/12/2014    Significant Diagnostic Results in last 30 days:  No results found.  Assessment/Plan There are no diagnoses linked to this encounter. 1. CAD S/P percutaneous coronary angioplasty Denies any chest pain.  He does take atorvastatin 20 mg as well as diltiazem, with CHADS2 score 4  2. Chronic respiratory failure with hypoxia (HCC) O2 dependent no further follow-up with pulmonary indicated  3. Essential hypertension Been controlled on current regimen of ties him and furosemide  4. ILD (interstitial lung disease) ([name]) See above problem is stable  5. MCI (mild cognitive impairment) with memory loss There is been some decline but he communicates appropriately today when asked simple questions.  Asked him to tell me names of family members and pictures around his room and he was able to do so  6. Persistent atrial fibrillation ([place]):  CHA2DS2-VASc Score 4; On Eliquis No issues with anticoagulant.  Rate is controlled with diltiazem    Family/ staff Communication:   Labs/tests ordered:

## 2022-03-07 ENCOUNTER — Encounter: Payer: Self-pay | Admitting: Nurse Practitioner

## 2022-03-07 ENCOUNTER — Non-Acute Institutional Stay (SKILLED_NURSING_FACILITY): Payer: Medicare Other | Admitting: Nurse Practitioner

## 2022-03-07 DIAGNOSIS — F419 Anxiety disorder, unspecified: Secondary | ICD-10-CM

## 2022-03-07 DIAGNOSIS — J849 Interstitial pulmonary disease, unspecified: Secondary | ICD-10-CM

## 2022-03-07 DIAGNOSIS — I10 Essential (primary) hypertension: Secondary | ICD-10-CM

## 2022-03-07 DIAGNOSIS — G3184 Mild cognitive impairment, so stated: Secondary | ICD-10-CM

## 2022-03-07 DIAGNOSIS — D509 Iron deficiency anemia, unspecified: Secondary | ICD-10-CM | POA: Diagnosis not present

## 2022-03-07 DIAGNOSIS — R634 Abnormal weight loss: Secondary | ICD-10-CM

## 2022-03-07 DIAGNOSIS — K219 Gastro-esophageal reflux disease without esophagitis: Secondary | ICD-10-CM | POA: Diagnosis not present

## 2022-03-07 DIAGNOSIS — E785 Hyperlipidemia, unspecified: Secondary | ICD-10-CM

## 2022-03-07 DIAGNOSIS — M1711 Unilateral primary osteoarthritis, right knee: Secondary | ICD-10-CM

## 2022-03-07 DIAGNOSIS — K5900 Constipation, unspecified: Secondary | ICD-10-CM

## 2022-03-07 DIAGNOSIS — I4819 Other persistent atrial fibrillation: Secondary | ICD-10-CM

## 2022-03-07 DIAGNOSIS — I5030 Unspecified diastolic (congestive) heart failure: Secondary | ICD-10-CM

## 2022-03-07 DIAGNOSIS — N3942 Incontinence without sensory awareness: Secondary | ICD-10-CM

## 2022-03-07 NOTE — Assessment & Plan Note (Signed)
stable, on MiraLax 

## 2022-03-07 NOTE — Assessment & Plan Note (Signed)
Blood pressure is controlled, continue Losartan, Diltiazem, Furosemide, Bun/creat 19/0.85 04/20/21

## 2022-03-07 NOTE — Assessment & Plan Note (Signed)
uses mechanical lift for transfer, takes prn Tylenol. 

## 2022-03-07 NOTE — Assessment & Plan Note (Signed)
heart rate is in control, on Diltiazem, Eliquis, TSH 3.33 11/11/20

## 2022-03-07 NOTE — Assessment & Plan Note (Signed)
takes Atorvastatin, LDL 70 10/22/21 

## 2022-03-07 NOTE — Assessment & Plan Note (Signed)
Mild cognitive impairment, SNF [facility] for supportive care.

## 2022-03-07 NOTE — Assessment & Plan Note (Signed)
no urinary retention, on Mirabegron, Tamsulosin  

## 2022-03-07 NOTE — Assessment & Plan Note (Signed)
on Furosamide. Bun/creat 22/0.8 10/22/21

## 2022-03-07 NOTE — Progress Notes (Signed)
Location:   SNF [place] Room Number: 05/A Place of Service:  SNF (31) Provider: [facility] Amandeep Hogston NP  Virgie Dad, MD  Patient Care Team: Virgie Dad, MD as PCP - General (Internal Medicine) Leonie Stanly Si, MD as PCP - Cardiology (Cardiology)  Extended Emergency Contact Information Primary Emergency Contact: [facility] Address: [address]          [place], [place] [address] [name] of [place] Phone: [phone] Mobile Phone: [phone] Relation: Spouse Secondary Emergency Contact: Janey Genta Address: [address]          [place], [place] Phone: [phone] Mobile Phone: [phone] Relation: Daughter  Code Status:  DNR Goals of care: Advanced Directive information    03/07/2022    3:40 PM  Advanced Directives  Does Patient Have a Medical Advance Directive? Yes  Type of Paramedic of [place];Out of facility DNR (pink MOST or yellow form)  Does patient want to make changes to medical advance directive? No - Patient declined  Copy of [place] in Chart? Yes - validated most recent copy scanned in chart (See row information)  Pre-existing out of facility DNR order (yellow form or pink MOST form) Yellow form placed in chart (order not valid for inpatient use);Pink Most/Yellow Form available - Physician notified to receive inpatient order     Chief Complaint  Patient presents with   Medical Management of Chronic Issues    Routine    HPI:  Pt is a 88 y.o. male seen today for medical management of chronic diseases.      Mild cognitive impairment, SNF [facility] for supportive care.  Urinary incontinent, no urinary retention, on Mirabegron, Tamsulosin              Constipation, stable, on MiraLax             OA, uses mechanical lift for transfer, takes prn Tylenol.             Hx of Interstitial lung disease, O2 dependent, no further f/u Pulmonology              CHF/minimal edema BLE, on Furosamide. Bun/creat 22/0.8 10/22/21 Anxiety/depression, stable, on Lexapro.              Afib, heart rate is in control, on Diltiazem, Eliquis, TSH 3.33 11/11/20             GERD, stable, on Esomeprazole             Anemia, stable, off Fe. Hgb 14.3 01/12/21             HTN,  on Losartan, Diltiazem, Furosemide, Bun/creat 19/0.85 04/20/21             OSA refused CPAP             Hyperlipidemia, takes Atorvastatin, LDL 70 10/22/21   Past Medical History:  Diagnosis Date   Anxiety    CAD S/P percutaneous coronary angioplasty 1996, 2009, 01/2010   PTCA of L Cx 1996; BMS-RCA [facility]) 2009; Staged PCI RCA (70% ISR) --> 3.0 mm x 23 mm BMS, staged PCI Diag - 2.0 mm x 12 mm Mini Vision BMS (2.25 mm)   Cataracts, bilateral    immature   Chronic back pain    scoliosis--pt states unable to have surgery bc of age   GERD (gastroesophageal reflux disease)    takes Nexium daily   History of colon polyps  Hyperlipidemia    Hypertension    Myocardial infarction (HCC) 1996, 2009   x 2;last time was about 8-10yrs ago    Obesity (BMI 30.0-34.9)    OSA (obstructive sleep apnea)    Osteoarthritis of both knees    Peripheral edema    Venous Stasis   Persistent atrial fibrillation (HCC)    Unable to maintaine NSR after DCCV with Tikosyn.  Plan = rate control w/diltiazem (beta blocker off b/c fatigue). CHA2DS2Vasc 4. DOAC - Eliquis.    Prostate cancer (HCC) 2005   seed implant   Past Surgical History:  Procedure Laterality Date   CARDIOVERSION N/A 06/12/2015   Procedure: CARDIOVERSION;  Surgeon: Mihai Croitoru, MD;  Location: [facility] END[facility]COPY;  Service: Cardiovascular;  Laterality: N/A;   CARDIOVERSION N/A 08/02/2015   Procedure: CARDIOVERSION;  Surgeon: James Allred, MD;  Location: [facility] OR;  Service: Cardiovascular;  Laterality: N/A;   CHOLECYSTECTOMY     COLONOSCOPY     CORONARY ANGIOPLASTY WITH STENT PLACEMENT  1996, 2009, August 2011   PTCA of L Cx 1996; BMS-RCA ([place]) 2009; Staged  PCI RCA (70% ISR) --> 3.0 mm x 23 mm BMS, staged PCI Diag - 2.0 mm x 12 mm Mini Vision BMS (2.25 mm)   cyst removed  in college   NEUROPLASTY / TRANSPOSITION MEDIAN NERVE AT CARPAL TUNNEL BILATERAL     [facility] MY[facility]  November 2013   EF 53%; fixed mid inferior-inferolateral defect, infarct with no ischemia.   right knee arthrsoscopy  13yrs ago   right trigger finger     seeds placed into prostate   2005   TEE WITHOUT CARDIOVERSION N/A 06/12/2015   Procedure: TRANSESOPHAGEAL ECHOCARDIOGRAM (TEE) - WITH CARDIOVERSION;  Surgeon: Mihai Croitoru, MD;  Location: [facility] END[facility];  Service: Cardiovascular: EF 55-60%.No LA or LAA thrombus. Normal Peal PAP ~33 mmHg.   TOTAL KNEE ARTHROPLASTY  05/09/2012   Procedure: TOTAL KNEE ARTHROPLASTY;  Surgeon: Frank J Rowan, MD;  Location: [facility] OR;  Service: Orthopedics;  Laterality: Right;   TRANSTHORACIC ECHOCARDIOGRAM  06/2015   EF 60-65%. Mild LA dilation. Normal PA pressures: 32 mmHg. Aortic sclerosis but no stenosis.   wisdom teeth extraccted      Allergies  Allergen Reactions   Crab [Shellfish Allergy] Rash   Codeine    Hydrocodone Hives   Adhesive [Tape] Rash   Oxycodone Rash    Allergies as of 03/07/2022       Reactions   Crab [shellfish Allergy] Rash   Codeine    Hydrocodone Hives   Adhesive [tape] Rash   Oxycodone Rash        Medication List        Accurate as of March 07, 2022 11:59 PM. If you have any questions, ask your nurse or doctor.          acetaminophen 325 MG tablet Commonly known as: TYLENOL Take 650 mg by mouth every 6 (six) hours as needed.   atorvastatin 20 MG tablet Commonly known as: LIPITOR Take 20 mg by mouth daily.   Biofreeze 4 % Gel Generic drug: Menthol (Topical Analgesic) Apply 1 application topically in the morning, at noon, in the evening, and at bedtime. Apply to right great toe   diltiazem 240 MG 24 hr capsule Commonly known as: DILACOR XR Take 240 mg by mouth daily. For heart failure    Eliquis 5 MG Tabs tablet Generic drug: apixaban TAKE 1 TABLET TWICE A DAY (SCHEDULE FOLLOW UP WITH CARDIOLOGIST PRIOR TO NEXT REFILL REQUEST)     escitalopram 10 MG tablet Commonly known as: LEXAPRO Take 10 mg by mouth daily.   esomeprazole 40 MG capsule Commonly known as: NEXIUM TAKE 1 CAPSULE DAILY   furosemide 40 MG tablet Commonly known as: LASIX Take 40 mg by mouth daily.   losartan 25 MG tablet Commonly known as: COZAAR Take 25 mg by mouth daily.   mirabegron ER 25 MG Tb24 tablet Commonly known as: MYRBETRIQ Take 25 mg by mouth daily.   MIRALAX PO Take 17 g by mouth daily.   nitroGLYCERIN 0.4 MG SL tablet Commonly known as: NITROSTAT Place 0.4 mg under the tongue as needed for chest pain.   PreviDent 5000 Booster Plus 1.1 % Pste Generic drug: Sodium Fluoride Place 1 application onto teeth at bedtime.   tamsulosin 0.4 MG Caps capsule Commonly known as: FLOMAX Take 0.4 mg by mouth daily.        Review of Systems  Constitutional:  Positive for unexpected weight change. Negative for fatigue and fever.       #23 Ibs weight loss in the past month.   HENT:  Positive for hearing loss. Negative for congestion and trouble swallowing.   Eyes:  Negative for visual disturbance.  Respiratory:  Positive for shortness of breath. Negative for cough.        O2 dependent. DOE  Cardiovascular:  Negative for leg swelling.  Gastrointestinal:  Negative for abdominal pain and constipation.       Incontinent of feces.   Genitourinary:  Positive for frequency. Negative for dysuria and urgency.       Incontinent of urine.   Musculoskeletal:  Positive for arthralgias and gait problem.       Left knee pain is chronic.   Skin:  Negative for color change.  Neurological:  Negative for speech difficulty, weakness and headaches.       Memory lapses.   Psychiatric/Behavioral:  Negative for behavioral problems and sleep disturbance. The patient is nervous/anxious.        Anxious at  times.     Immunization History  Administered Date(s) Administered   H1N1 04/13/2010   Influenza, High Dose Seasonal PF 02/12/2014, 03/29/2018, 01/05/2019   Influenza-Unspecified 03/06/2013, 02/12/2014, 03/06/2017, 03/17/2020, 03/24/2021   Moderna SARS-COV2 Booster Vaccination 11/03/2020, 10/22/2021   Moderna Sars-Covid-2 Vaccination 06/08/2019, 07/06/2019, 04/14/2020   PFIZER(Purple Top)SARS-COV-2 Vaccination 02/23/2021   Pneumococcal Conjugate-13 02/12/2014   Pneumococcal Polysaccharide-23 02/04/2013, 02/12/2014   Td 09/04/2005   Tdap 12/11/2012   Zoster Recombinat (Shingrix) 08/27/2021   Zoster, Live 07/07/2006   Pertinent  Health Maintenance Due  Topic Date Due   INFLUENZA VACCINE  01/04/2022      01/30/2019    5:00 PM  Fall Risk  Patient Fall Risk Level Low fall risk   Functional Status Survey:    Vitals:   03/07/22 1531  BP: 118/62  Pulse: 72  Resp: 20  Temp: 98.1 F (36.7 C)  SpO2: 95%  Weight: 172 lb 3.2 oz (78.1 kg)  Height: 5' 7" (1.702 m)   Body mass index is 26.97 kg/m. Physical Exam Vitals and nursing note reviewed.  Constitutional:      Appearance: Normal appearance.  HENT:     Head: Normocephalic and atraumatic.  Eyes:     Extraocular Movements: Extraocular movements intact.     Conjunctiva/sclera: Conjunctivae normal.     Pupils: Pupils are equal, round, and reactive to light.  Cardiovascular:     Rate and Rhythm: Normal rate. Rhythm irregular.     Heart sounds: No   murmur heard. Pulmonary:     Breath sounds: No wheezing.     Comments: O2 dependent.  Abdominal:     General: Bowel sounds are normal.     Palpations: Abdomen is soft.     Tenderness: There is no abdominal tenderness.  Musculoskeletal:     Cervical back: Normal range of motion and neck supple.     Right lower leg: No edema.     Left lower leg: No edema.     Comments: Mechanical lift for transfer.   Skin:    General: Skin is warm and dry.  Neurological:     General: No  focal deficit present.     Mental Status: He is alert and oriented to person, place, and time. Mental status is at baseline.     Gait: Gait abnormal.  Psychiatric:        Mood and Affect: Mood normal.        Behavior: Behavior normal.        Thought Content: Thought content normal.     Labs reviewed: Recent Labs    04/20/21 0000 10/22/21 0000  NA 138 141  K 4.2 3.8  CL 101 102  CO2 32*  --   BUN 19 22*  CREATININE 0.9 0.8  CALCIUM 8.7  --    Recent Labs    04/20/21 0000  AST 9*  ALT 3*  ALKPHOS 62  ALBUMIN 3.5   No results for input(s): "WBC", "NEUTROABS", "HGB", "HCT", "MCV", "PLT" in the last 8760 hours. Lab Results  Component Value Date   TSH 3.33 11/11/2020   No results found for: "HGBA1C" Lab Results  Component Value Date   CHOL 124 10/22/2021   HDL 39 10/22/2021   LDLCALC 70 10/22/2021   TRIG 69 10/22/2021   CHOLHDL 2.9 02/12/2014    Significant Diagnostic Results in last 30 days:  No results found.  Assessment/Plan  Essential hypertension Blood pressure is controlled, continue Losartan, Diltiazem, Furosemide, Bun/creat 19/0.85 04/20/21  Hyperlipidemia with target LDL less than 70  takes Atorvastatin, LDL 70 10/22/21  Iron deficiency anemia  stable, off Fe. Hgb 14.3 01/12/21  GERD (gastroesophageal reflux disease) stable, on Esomeprazole  Persistent atrial fibrillation (HCC):  CHA2DS2-VASc Score 4; On Eliquis heart rate is in control, on Diltiazem, Eliquis, TSH 3.33 11/11/20  Anxiety stable, on Lexapro.   Diastolic CHF (HCC) on Furosamide. Bun/creat 22/0.8 10/22/21  ILD (interstitial lung disease) (HCC) O2 dependent, no further f/u Pulmonology  Osteoarthritis of right knee uses mechanical lift for transfer, takes prn Tylenol.  Constipation stable, on MiraLax  Urinary incontinence no urinary retention, on Mirabegron, Tamsulosin   MCI (mild cognitive impairment) with memory loss Mild cognitive impairment, SNF [facility] for supportive care.    Weight loss #23 Ibs weight loss in the past month. Weekly weight x4, update CBC/diff, CMP/eGFR, TSH    Family/ staff Communication: plan of care reviewed with the patient and charge nurse.   Labs/tests ordered:  CBC/diff, CMP/eGFR, TSH   Time spend 35 minutes.     

## 2022-03-07 NOTE — Assessment & Plan Note (Signed)
stable, on Esomeprazole 

## 2022-03-07 NOTE — Assessment & Plan Note (Signed)
stable, off Fe. Hgb 14.3 01/12/21

## 2022-03-07 NOTE — Assessment & Plan Note (Signed)
stable, on Lexapro.  

## 2022-03-07 NOTE — Assessment & Plan Note (Signed)
O2 dependent, no further f/u Pulmonology

## 2022-03-08 ENCOUNTER — Encounter: Payer: Self-pay | Admitting: Nurse Practitioner

## 2022-03-08 NOTE — Assessment & Plan Note (Signed)
#  23 Ibs weight loss in the past month. Weekly weight x4, update CBC/diff, CMP/eGFR, TSH

## 2022-03-10 LAB — CBC AND DIFFERENTIAL
HCT: 42 (ref 41–53)
Hemoglobin: 14.6 (ref 13.5–17.5)
Neutrophils Absolute: 5707
Platelets: 219 10*3/uL (ref 150–400)
WBC: 8.2

## 2022-03-10 LAB — BASIC METABOLIC PANEL
BUN: 18 (ref 4–21)
CO2: 25 — AB (ref 13–22)
Chloride: 100 (ref 99–108)
Creatinine: 1 (ref 0.6–1.3)
Glucose: 87
Potassium: 4.3 mEq/L (ref 3.5–5.1)
Sodium: 137 (ref 137–147)

## 2022-03-10 LAB — HEPATIC FUNCTION PANEL
ALT: 3 U/L — AB (ref 10–40)
AST: 12 — AB (ref 14–40)
Alkaline Phosphatase: 70 (ref 25–125)
Bilirubin, Total: 0.8

## 2022-03-10 LAB — TSH: TSH: 3.18 (ref 0.41–5.90)

## 2022-03-10 LAB — COMPREHENSIVE METABOLIC PANEL
Albumin: 3.7 (ref 3.5–5.0)
Calcium: 9.3 (ref 8.7–10.7)
Globulin: 3.1
eGFR: 75

## 2022-03-10 LAB — CBC: RBC: 4.21 (ref 3.87–5.11)

## 2022-04-01 ENCOUNTER — Encounter: Payer: Self-pay | Admitting: Nurse Practitioner

## 2022-04-01 ENCOUNTER — Non-Acute Institutional Stay (SKILLED_NURSING_FACILITY): Payer: Medicare Other | Admitting: Nurse Practitioner

## 2022-04-01 DIAGNOSIS — D509 Iron deficiency anemia, unspecified: Secondary | ICD-10-CM

## 2022-04-01 DIAGNOSIS — M1711 Unilateral primary osteoarthritis, right knee: Secondary | ICD-10-CM

## 2022-04-01 DIAGNOSIS — I10 Essential (primary) hypertension: Secondary | ICD-10-CM

## 2022-04-01 DIAGNOSIS — N3942 Incontinence without sensory awareness: Secondary | ICD-10-CM

## 2022-04-01 DIAGNOSIS — J849 Interstitial pulmonary disease, unspecified: Secondary | ICD-10-CM

## 2022-04-01 DIAGNOSIS — G3184 Mild cognitive impairment, so stated: Secondary | ICD-10-CM

## 2022-04-01 DIAGNOSIS — E785 Hyperlipidemia, unspecified: Secondary | ICD-10-CM

## 2022-04-01 DIAGNOSIS — K5900 Constipation, unspecified: Secondary | ICD-10-CM

## 2022-04-01 DIAGNOSIS — R269 Unspecified abnormalities of gait and mobility: Secondary | ICD-10-CM

## 2022-04-01 DIAGNOSIS — I5030 Unspecified diastolic (congestive) heart failure: Secondary | ICD-10-CM

## 2022-04-01 DIAGNOSIS — I4819 Other persistent atrial fibrillation: Secondary | ICD-10-CM

## 2022-04-01 DIAGNOSIS — K219 Gastro-esophageal reflux disease without esophagitis: Secondary | ICD-10-CM

## 2022-04-01 DIAGNOSIS — G4733 Obstructive sleep apnea (adult) (pediatric): Secondary | ICD-10-CM

## 2022-04-01 DIAGNOSIS — F419 Anxiety disorder, unspecified: Secondary | ICD-10-CM

## 2022-04-01 NOTE — Assessment & Plan Note (Signed)
heart rate is in control, on Diltiazem, Eliquis, TSH 3.18 03/10/22

## 2022-04-01 NOTE — Assessment & Plan Note (Signed)
no urinary retention, on Mirabegron, Tamsulosin

## 2022-04-01 NOTE — Assessment & Plan Note (Signed)
on Losartan, Diltiazem, Furosemide, Bun/creat 18/0.96 03/10/22

## 2022-04-01 NOTE — Assessment & Plan Note (Signed)
stable, on Esomeprazole

## 2022-04-01 NOTE — Assessment & Plan Note (Signed)
stable, on MiraLax

## 2022-04-01 NOTE — Assessment & Plan Note (Signed)
mechanical lift for transfer, w/c for mobility, found sitting on floor in front of his recliner 03/31/22, no apparent injury.

## 2022-04-01 NOTE — Assessment & Plan Note (Addendum)
CHF/minimal edema BLE, on Furosamide. Bun/creat 18/0.96 03/10/22

## 2022-04-01 NOTE — Assessment & Plan Note (Signed)
stable, on Lexapro.

## 2022-04-01 NOTE — Assessment & Plan Note (Signed)
Hx of Interstitial lung disease, O2 dependent, no further f/u Pulmonology

## 2022-04-01 NOTE — Progress Notes (Unsigned)
Location:   [facility] Room Number: NO/05/A Place of Service:  SNF (31) Provider: [facility] Meyli Boice NP  Virgie Dad, MD  Patient Care Team: Virgie Dad, MD as PCP - General (Internal Medicine) Leonie Emmalene Kattner, MD as PCP - Cardiology (Cardiology)  Extended Emergency Contact Information Primary Emergency Contact: [facility] Address: [address]          [place], [place] [address] [name] of [place] Phone: [phone] Mobile Phone: [phone] Relation: Spouse Secondary Emergency Contact: Janey Genta Address: [address]          [place], [place] Phone: [phone] Mobile Phone: [phone] Relation: Daughter  Code Status:  DNR Goals of care: Advanced Directive information    04/01/2022    2:39 PM  Advanced Directives  Does Patient Have a Medical Advance Directive? Yes  Type of Paramedic of [place];Out of facility DNR (pink MOST or yellow form)  Does patient want to make changes to medical advance directive? No - Patient declined  Copy of [place] in Chart? Yes - validated most recent copy scanned in chart (See row information)  Pre-existing out of facility DNR order (yellow form or pink MOST form) Yellow form placed in chart (order not valid for inpatient use);Pink Most/Yellow Form available - Physician notified to receive inpatient order     Chief Complaint  Patient presents with   Medical Management of Chronic Issues    Patient is here for a follow up for chronic conditions     HPI:  Pt is a 88 y.o. male seen today for medical management of chronic diseases.      Gait abnormality, mechanical lift for transfer, w/c for mobility, found sitting on floor in front of his recliner 03/31/22, no apparent injury.   Mild cognitive impairment, SNF [facility] for supportive care.  Urinary incontinent, no urinary retention, on Mirabegron, Tamsulosin               Constipation, stable, on MiraLax             OA, uses mechanical lift for transfer, takes prn Tylenol.             Hx of Interstitial lung disease, O2 dependent, no further f/u Pulmonology             CHF/minimal edema BLE, on Furosamide. Bun/creat 18/0.96 03/10/22 Anxiety/depression, stable, on Lexapro.              Afib, heart rate is in control, on Diltiazem, Eliquis, TSH 3.18 03/10/22             GERD, stable, on Esomeprazole             Anemia, stable, off Fe. Hgb 14.6 03/10/22             HTN,  off  Losartan 04/04/22 2/2 low Bp, on Diltiazem, Furosemide, Bun/creat 18/0.96 03/10/22             OSA refused CPAP             Hyperlipidemia, takes Atorvastatin, LDL 70 10/22/21 Past Medical History:  Diagnosis Date   Anxiety    CAD S/P percutaneous coronary angioplasty 1996, 2009, 01/2010   PTCA of L Cx 1996; BMS-RCA [facility]) 2009; Staged PCI RCA (70% ISR) --> 3.0 mm x 23 mm BMS, staged PCI Diag - 2.0 mm x 12 mm Mini Vision BMS (2.25 mm)   Cataracts, bilateral  immature   Chronic back pain    scoliosis--pt states unable to have surgery bc of age   GERD (gastroesophageal reflux disease)    takes Nexium daily   History of colon polyps    Hyperlipidemia    Hypertension    Myocardial infarction ([place]) 1996, 2009   x 2;last time was about 8-82yr ago    Obesity (BMI 30.0-34.9)    OSA (obstructive sleep apnea)    Osteoarthritis of both knees    Peripheral edema    Venous Stasis   Persistent atrial fibrillation ([place]    Unable to maintaine NSR after DCCV with Tikosyn.  Plan = rate control w/diltiazem (beta blocker off b/c fatigue). CHA2DS2Vasc 4. DOAC - Eliquis.    Prostate cancer ([place] 2005   seed implant   Past Surgical History:  Procedure Laterality Date   CARDIOVERSION N/A 06/12/2015   Procedure: CARDIOVERSION;  Surgeon: MSanda Klein MD;  [facility];  Service: Cardiovascular;  Laterality: N/A;   CARDIOVERSION N/A 08/02/2015   Procedure: CARDIOVERSION;  Surgeon: JThompson Grayer MD;  [facility]  Service: Cardiovascular;  Laterality: N/A;   [place] 2009, August 2011   PTCA of L Cx 1996; BMS-RCA ([facility] 2009; Staged PCI RCA (70% ISR) --> 3.0 mm x 23 mm BMS, staged PCI Diag - 2.0 mm x 12 mm Mini Vision BMS (2.25 mm)   cyst removed  in college   NEUROPLASTY / TRANSPOSITION MEDIAN NERVE AT CARPAL TUNNEL BILATERAL     [facility]  November 2013   EF 53%; fixed mid inferior-inferolateral defect, infarct with no ischemia.   right knee arthrsoscopy  [place]yr[place]   right trigger finger     seeds placed into prostate   2005   TEE WITHOUT CARDIOVERSION N/A 06/12/2015   Procedure: TRANSESOPHAGEAL ECHOCARDIOGRAM (TEE) - WITH CARDIOVERSION;  Surgeon: MiSanda KleinMD;  [facility] Service: Cardiovascular: EF 55-60%.No LA or LAA thrombus. Normal Peal PAP ~33 mmHg.   TOTAL KNEE ARTHROPLASTY  05/09/2012   Procedure: TOTAL KNEE ARTHROPLASTY;  Surgeon: FrKerin SalenMD;  [facility] Service: Orthopedics;  Laterality: Right;   TRANSTHORACIC ECHOCARDIOGRAM  06/2015   EF 60-65%. Mild LA dilation. Normal PA pressures: 32 mmHg. Aortic sclerosis but no stenosis.   wisdom teeth extraccted      Allergies  Allergen Reactions   Crab [Shellfish Allergy] Rash   Codeine    Hydrocodone Hives   Adhesive [Tape] Rash   Oxycodone Rash    Allergies as of 04/01/2022       Reactions   Crab [shellfish Allergy] Rash   Codeine    Hydrocodone Hives   Adhesive [tape] Rash   Oxycodone Rash        Medication List        Accurate as of April 01, 2022 11:59 PM. If you have any questions, ask your nurse or doctor.          STOP taking these medications    losartan 25 MG tablet Commonly known as: COZAAR Stopped by: Dallan Schonberg X Esli Jernigan, NP       TAKE these medications    acetaminophen 325 MG tablet Commonly known as: TYLENOL Take 650 mg by mouth every 6 (six) hours as needed.    atorvastatin 20 MG tablet Commonly known as: LIPITOR Take 20 mg by mouth daily.   Biofreeze 4 % Gel Generic drug:  Menthol (Topical Analgesic) Apply 1 application topically in the morning, at noon, in the evening, and at bedtime. Apply to right great toe   diltiazem 240 MG 24 hr capsule Commonly known as: DILACOR XR Take 240 mg by mouth daily. For heart failure   Eliquis 5 MG Tabs tablet Generic drug: apixaban TAKE 1 TABLET TWICE A DAY (SCHEDULE FOLLOW UP WITH CARDIOLOGIST PRIOR TO NEXT REFILL REQUEST)   escitalopram 10 MG tablet Commonly known as: LEXAPRO Take 10 mg by mouth daily.   esomeprazole 40 MG capsule Commonly known as: NEXIUM TAKE 1 CAPSULE DAILY   furosemide 40 MG tablet Commonly known as: LASIX Take 40 mg by mouth daily.   mirabegron ER 25 MG Tb24 tablet Commonly known as: MYRBETRIQ Take 25 mg by mouth daily.   MIRALAX PO Take 17 g by mouth daily.   nitroGLYCERIN 0.4 MG SL tablet Commonly known as: NITROSTAT Place 0.4 mg under the tongue as needed for chest pain.   PreviDent 5000 Booster Plus 1.1 % Pste Generic drug: Sodium Fluoride Place 1 application onto teeth at bedtime.   tamsulosin 0.4 MG Caps capsule Commonly known as: FLOMAX Take 0.4 mg by mouth daily.        Review of Systems  Constitutional:  Negative for fatigue and fever.  HENT:  Positive for hearing loss. Negative for congestion and trouble swallowing.   Eyes:  Negative for visual disturbance.  Respiratory:  Positive for shortness of breath. Negative for cough.        O2 dependent. DOE  Cardiovascular:  Negative for leg swelling.  Gastrointestinal:  Negative for abdominal pain and constipation.       Incontinent of feces.   Genitourinary:  Positive for frequency. Negative for dysuria and urgency.       Incontinent of urine.   Musculoskeletal:  Positive for arthralgias and gait problem.       Left knee pain is chronic.   Skin:  Negative for color change.  Neurological:   Negative for speech difficulty, weakness and headaches.       Memory lapses.   Psychiatric/Behavioral:  Negative for behavioral problems and sleep disturbance. The patient is nervous/anxious.        Anxious at times.     Immunization History  Administered Date(s) Administered   H1N1 04/13/2010   Influenza, High Dose Seasonal PF 02/12/2014, 03/29/2018, 01/05/2019   Influenza-Unspecified 03/06/2013, 02/12/2014, 03/06/2017, 03/17/2020, 03/24/2021   Moderna SARS-COV2 Booster Vaccination 11/03/2020, 10/22/2021   Moderna Sars-Covid-2 Vaccination 06/08/2019, 07/06/2019, 04/14/2020   PFIZER(Purple Top)SARS-COV-2 Vaccination 02/23/2021   Pneumococcal Conjugate-13 02/12/2014   Pneumococcal Polysaccharide-23 02/04/2013, 02/12/2014   Td 09/04/2005   Tdap 12/11/2012   Zoster Recombinat (Shingrix) 08/27/2021   Zoster, Live 07/07/2006   Pertinent  Health Maintenance Due  Topic Date Due   INFLUENZA VACCINE  01/04/2022      01/30/2019    5:00 PM 04/01/2022    2:39 PM  [place] in the past year?  0  Was there an injury with Fall?  0  Fall Risk Category Calculator  0  Fall Risk Category  Low  Patient Fall Risk Level Low fall risk Low fall risk  Patient at Risk for Falls Due to  No Fall Risks  Fall risk Follow up  Falls evaluation completed   Functional Status Survey:    Vitals:   04/01/22 1438  BP: (!) 109/57  Pulse: 70  Resp: 18  Temp: (!) 97.1 F (36.2 C)  SpO2: 97%  Weight: 188 lb 6.4 oz (85.5 kg)  Height: '5\' 7"'$  (1.702 m)   Body mass index is 29.51 kg/m. Physical Exam Vitals and nursing note reviewed.  Constitutional:      Appearance: Normal appearance.  HENT:     Head: Normocephalic and atraumatic.  Eyes:     Extraocular Movements: Extraocular movements intact.     Conjunctiva/sclera: Conjunctivae normal.     Pupils: Pupils are equal, round, and reactive to light.  Cardiovascular:     Rate and Rhythm: Normal rate. Rhythm irregular.     Heart sounds: No murmur  heard. Pulmonary:     Breath sounds: No wheezing.     Comments: O2 dependent.  Abdominal:     General: Bowel sounds are normal.     Palpations: Abdomen is soft.     Tenderness: There is no abdominal tenderness.  Musculoskeletal:     Cervical back: Normal range of motion and neck supple.     Right lower leg: No edema.     Left lower leg: No edema.     Comments: Mechanical lift for transfer.   Skin:    General: Skin is warm and dry.  Neurological:     General: No focal deficit present.     Mental Status: He is alert and oriented to person, place, and time. Mental status is at baseline.     Gait: Gait abnormal.  Psychiatric:        Mood and Affect: Mood normal.        Behavior: Behavior normal.        Thought Content: Thought content normal.     Labs reviewed: Recent Labs    04/20/21 0000 10/22/21 0000  NA 138 141  K 4.2 3.8  CL 101 102  CO2 32*  --   BUN 19 22*  CREATININE 0.9 0.8  CALCIUM 8.7  --    Recent Labs    04/20/21 0000  AST 9*  ALT 3*  ALKPHOS 62  ALBUMIN 3.5   No results for input(s): "WBC", "NEUTROABS", "HGB", "HCT", "MCV", "PLT" in the last 8760 hours. Lab Results  Component Value Date   TSH 3.33 11/11/2020   No results found for: "HGBA1C" Lab Results  Component Value Date   CHOL 124 10/22/2021   HDL 39 10/22/2021   LDLCALC 70 10/22/2021   TRIG 69 10/22/2021   CHOLHDL 2.9 02/12/2014    Significant Diagnostic Results in last 30 days:  No results found.  Assessment/Plan  Gait abnormality  mechanical lift for transfer, w/c for mobility, found sitting on floor in front of his recliner 03/31/22, no apparent injury.   MCI (mild cognitive impairment) with memory loss SNF [facility] for supportive care.   Urinary incontinence no urinary retention, on Mirabegron, Tamsulosin   Constipation stable, on MiraLax  Osteoarthritis of right knee uses mechanical lift for transfer, takes prn Tylenol.  ILD (interstitial lung disease) (HCC) Hx of  Interstitial lung disease, O2 dependent, no further f/u Pulmonology  Diastolic CHF ([place])  CHF/minimal edema BLE, on Furosamide. Bun/creat 18/0.96 03/10/22  Anxiety stable, on Lexapro.   Persistent atrial fibrillation ([place]):  CHA2DS2-VASc Score 4; On Eliquis heart rate is in control, on Diltiazem, Eliquis, TSH 3.18 03/10/22  GERD (gastroesophageal reflux disease) stable, on Esomeprazole  Iron deficiency anemia  stable, off Fe. Hgb 14.6 03/10/22  Essential hypertension Will dc Losartan 2/2 low Bp and recent fall, continue Diltiazem, Furosemide, Bun/creat 18/0.96 03/10/22  OSA (obstructive sleep apnea)  refused CPAP  Hyperlipidemia with  target LDL less than 70  takes Atorvastatin, LDL 70 10/22/21   Family/ staff Communication: plan of care reviewed with the patient and charge nurse.   Labs/tests ordered:  none   Time spend 35 minutes.

## 2022-04-01 NOTE — Assessment & Plan Note (Signed)
takes Atorvastatin, LDL 70 10/22/21

## 2022-04-01 NOTE — Assessment & Plan Note (Signed)
stable, off Fe. Hgb 14.6 03/10/22

## 2022-04-01 NOTE — Assessment & Plan Note (Signed)
SNF [facility] for supportive care.  

## 2022-04-01 NOTE — Assessment & Plan Note (Signed)
refused CPAP 

## 2022-04-01 NOTE — Assessment & Plan Note (Signed)
uses mechanical lift for transfer, takes prn Tylenol.

## 2022-04-04 ENCOUNTER — Encounter: Payer: Self-pay | Admitting: Nurse Practitioner

## 2022-05-24 ENCOUNTER — Non-Acute Institutional Stay (SKILLED_NURSING_FACILITY): Payer: Medicare Other | Admitting: Family Medicine

## 2022-05-24 DIAGNOSIS — G3184 Mild cognitive impairment, so stated: Secondary | ICD-10-CM | POA: Diagnosis not present

## 2022-05-24 DIAGNOSIS — E785 Hyperlipidemia, unspecified: Secondary | ICD-10-CM

## 2022-05-24 DIAGNOSIS — I10 Essential (primary) hypertension: Secondary | ICD-10-CM

## 2022-05-24 DIAGNOSIS — I4819 Other persistent atrial fibrillation: Secondary | ICD-10-CM

## 2022-05-24 DIAGNOSIS — J849 Interstitial pulmonary disease, unspecified: Secondary | ICD-10-CM

## 2022-05-24 NOTE — Progress Notes (Signed)
Provider:  Alain Honey, MD Location:      Place of Service:     PCP: Virgie Dad, MD Patient Care Team: Virgie Dad, MD as PCP - General (Internal Medicine) Leonie Man, MD as PCP - Cardiology (Cardiology)  Extended Emergency Contact Information Primary Emergency Contact: [facility] Address: [address]          [place], [place] [address] [name] of [place] Phone: [phone] Mobile Phone: [phone] Relation: Spouse Secondary Emergency Contact: Janey Genta Address: [address]          [place], [place] Phone: [phone] Mobile Phone: [phone] Relation: Daughter  Code Status:  Goals of Care: Advanced Directive information    04/01/2022    2:39 PM  Advanced Directives  Does Patient Have a Medical Advance Directive? Yes  Type of Paramedic of [place];Out of facility DNR (pink MOST or yellow form)  Does patient want to make changes to medical advance directive? No - Patient declined  Copy of [place] in Chart? Yes - validated most recent copy scanned in chart (See row information)  Pre-existing out of facility DNR order (yellow form or pink MOST form) Yellow form placed in chart (order not valid for inpatient use);Pink Most/Yellow Form available - Physician notified to receive inpatient order      No chief complaint on file.   HPI: Patient is a 88 y.o. male seen today for medical management of chronic problems including mild cognitive impairment, treated interstitial lung disease and O2 dependent, anxiety A-fib, hypertension, and hyperlipidemia.  Patient stays in his room most of the time occasionally takes a male in the dining room but prefers to stay in his room and better on recliner.  At my last visit with him he was in bed.  He uses wheelchair for mobility and mechanical lift for transfers.  Denies any recent issues with dyspnea or breathing. Appetite  is good.  No issues with insomnia.  Denies constipation does have urinary incontinence.  Had radioactive seeds implanted in prostate in the past  Past Medical History:  Diagnosis Date   Anxiety    CAD S/P percutaneous coronary angioplasty 1996, 2009, 01/2010   PTCA of L Cx 1996; BMS-RCA [facility]) 2009; Staged PCI RCA (70% ISR) --> 3.0 mm x 23 mm BMS, staged PCI Diag - 2.0 mm x 12 mm Mini Vision BMS (2.25 mm)   Cataracts, bilateral    immature   Chronic back pain    scoliosis--pt states unable to have surgery bc of age   GERD (gastroesophageal reflux disease)    takes Nexium daily   History of colon polyps    Hyperlipidemia    Hypertension    Myocardial infarction ([place]) 1996, 2009   x 2;last time was about 8-6yr ago    Obesity (BMI 30.0-34.9)    OSA (obstructive sleep apnea)    Osteoarthritis of both knees    Peripheral edema    Venous Stasis   Persistent atrial fibrillation ([place]    Unable to maintaine NSR after DCCV with Tikosyn.  Plan = rate control w/diltiazem (beta blocker off b/c fatigue). CHA2DS2Vasc 4. DOAC - Eliquis.    [place]   seed implant   Past Surgical History:  Procedure Laterality Date   CARDIOVERSION N/A 06/12/2015   Procedure: CARDIOVERSION;  Surgeon: MSanda Klein MD;  [facility];  Service: Cardiovascular;  Laterality: N/A;   CARDIOVERSION N/A 08/02/2015  Procedure: CARDIOVERSION;  Surgeon: [name], MD;  [place];  Service: Cardiovascular;  Laterality: N/A;   [place], 2009, August 2011   PTCA of L Cx 1996; BMS-RCA [facility]) 2009; Staged PCI RCA (70% ISR) --> 3.0 mm x 23 mm BMS, staged PCI Diag - 2.0 mm x 12 mm Mini Vision BMS (2.25 mm)   cyst removed  in college   NEUROPLASTY / TRANSPOSITION MEDIAN NERVE AT CARPAL TUNNEL BILATERAL     NM MYOVIEW LTD  November 2013   EF 53%; fixed mid inferior-inferolateral defect, infarct with no ischemia.    right knee arthrsoscopy  67yr ago   right trigger finger     seeds placed into prostate   2005   TEE WITHOUT CARDIOVERSION N/A 06/12/2015   Procedure: TRANSESOPHAGEAL ECHOCARDIOGRAM (TEE) - WITH CARDIOVERSION;  Surgeon: MSanda Klein MD;  [place] [facility] [place]  Service: Cardiovascular: EF 55-60%.No LA or LAA thrombus. Normal Peal PAP ~33 mmHg.   TOTAL KNEE ARTHROPLASTY  05/09/2012   Procedure: TOTAL KNEE ARTHROPLASTY;  Surgeon: FKerin Salen MD;  [place] [facility]  Service: Orthopedics;  Laterality: Right;   TRANSTHORACIC ECHOCARDIOGRAM  06/2015   EF 60-65%. Mild LA dilation. Normal PA pressures: 32 mmHg. Aortic sclerosis but no stenosis.   wisdom teeth extraccted      reports that he quit smoking about 38 years ago. His smoking use included pipe. He has never used smokeless tobacco. He reports current alcohol use. He reports that he does not use drugs. Social History   Socioeconomic History   Marital status: Married    Spouse name: Not on file   Number of children: Not on file   Years of education: Not on file   Highest education level: Not on file  Occupational History   Occupation: Retired  Tobacco Use   Smoking status: Former    Types: Pipe    Quit date: 03/12/1984    Years since quitting: 38.2   Smokeless tobacco: Never   Tobacco comments:    quit smoking pipe about 265yrago  Vaping Use   Vaping Use: Never used  Substance and Sexual Activity   Alcohol use: Yes    Comment: glass of wine daily   Drug use: No   Sexual activity: Not Currently  Other Topics Concern   Not on file  Social History Narrative   He is a WIDOWED father of 2, grandfather of 2.   [facility]   He does not smoke. Drinks occasional alcohol.    Social Determinants of Health   Financial Resource Strain: Not on file  Food Insecurity: Not on file  Transportation Needs: Not on file  Physical Activity: Not on file  Stress: Not on file  Social  Connections: Not on file  Intimate Partner Violence: Not on file    Functional Status Survey:    Family History  Problem Relation Age of Onset   Cancer Mother 7046     abdomen died in 7018s Heart attack Father 7019     died in 7079s  Health Maintenance  Topic Date Due   Zoster Vaccines- Shingrix (2 of 2) 10/22/2021   INFLUENZA VACCINE  01/04/2022   COVID-19 Vaccine (7 - 2023-24 season) 02/04/2022   Medicare Annual Wellness (AWV)  08/25/2022   DTaP/Tdap/Td (3 - Td or Tdap) 12/12/2022  Pneumonia Vaccine 37+ Years old  Completed   HPV VACCINES  Aged Out    Allergies  Allergen Reactions   Crab [Shellfish Allergy] Rash   Codeine    Hydrocodone Hives   Adhesive [Tape] Rash   Oxycodone Rash    Outpatient Encounter Medications as of 05/24/2022  Medication Sig   acetaminophen (TYLENOL) 325 MG tablet Take 650 mg by mouth every 6 (six) hours as needed.   atorvastatin (LIPITOR) 20 MG tablet Take 20 mg by mouth daily.   diltiazem (DILACOR XR) 240 MG 24 hr capsule Take 240 mg by mouth daily. For heart failure   ELIQUIS 5 MG TABS tablet TAKE 1 TABLET TWICE A DAY (SCHEDULE FOLLOW UP WITH CARDIOLOGIST PRIOR TO NEXT REFILL REQUEST)   escitalopram (LEXAPRO) 10 MG tablet Take 10 mg by mouth daily.   esomeprazole (NEXIUM) 40 MG capsule TAKE 1 CAPSULE DAILY   furosemide (LASIX) 40 MG tablet Take 40 mg by mouth daily.   Menthol, Topical Analgesic, (BIOFREEZE) 4 % GEL Apply 1 application topically in the morning, at noon, in the evening, and at bedtime. Apply to right great toe   mirabegron ER (MYRBETRIQ) 25 MG TB24 tablet Take 25 mg by mouth daily.    nitroGLYCERIN (NITROSTAT) 0.4 MG SL tablet Place 0.4 mg under the tongue as needed for chest pain.   Polyethylene Glycol 3350 (MIRALAX PO) Take 17 g by mouth daily.   Sodium Fluoride (PREVIDENT 5000 BOOSTER PLUS) 1.1 % PSTE Place 1 application onto teeth at bedtime.   tamsulosin (FLOMAX) 0.4 MG CAPS capsule Take 0.4 mg by mouth daily.    No  facility-administered encounter medications on file as of 05/24/2022.    Review of Systems  Constitutional: Negative.   HENT: Negative.    Respiratory:  Positive for shortness of breath.   Cardiovascular: Negative.   Genitourinary: Negative.   Neurological: Negative.   Hematological: Negative.   Psychiatric/Behavioral:  Positive for confusion.   All other systems reviewed and are negative.   There were no vitals filed for this visit. There is no height or weight on file to calculate BMI. Physical Exam Vitals and nursing note reviewed.  Constitutional:      Appearance: Normal appearance. He is obese.  HENT:     Mouth/Throat:     Mouth: Mucous membranes are moist.     Pharynx: Oropharynx is clear.  Eyes:     Extraocular Movements: Extraocular movements intact.     Conjunctiva/sclera: Conjunctivae normal.     Pupils: Pupils are equal, round, and reactive to light.  Cardiovascular:     Rate and Rhythm: Normal rate and regular rhythm.  Pulmonary:     Effort: Pulmonary effort is normal.     Breath sounds: Normal breath sounds.  Skin:    General: Skin is dry.  Neurological:     General: No focal deficit present.     Mental Status: He is alert.     Comments: Oriented to person and place but not time     Labs reviewed: Basic Metabolic Panel: Recent Labs    10/22/21 0000  NA 141  K 3.8  CL 102  BUN 22*  CREATININE 0.8   Liver Function Tests: No results for input(s): "AST", "ALT", "ALKPHOS", "BILITOT", "PROT", "ALBUMIN" in the last 8760 hours. No results for input(s): "LIPASE", "AMYLASE" in the last 8760 hours. No results for input(s): "AMMONIA" in the last 8760 hours. CBC: No results for input(s): "WBC", "NEUTROABS", "HGB", "HCT", "MCV", "PLT" in the  last 8760 hours. Cardiac Enzymes: No results for input(s): "CKTOTAL", "CKMB", "CKMBINDEX", "TROPONINI" in the last 8760 hours. BNP: Invalid input(s): "POCBNP" No results found for: "HGBA1C" Lab Results  Component  Value Date   TSH 3.33 11/11/2020   Lab Results  Component Value Date   VITAMINB12 335 04/03/2019   Lab Results  Component Value Date   FOLATE 13.7 04/03/2019   Lab Results  Component Value Date   IRON 30 04/03/2019   FERRITIN 32 04/03/2019    Imaging and Procedures obtained prior to SNF admission: CT Chest W Contrast  Result Date: 03/27/2019 CLINICAL DATA:  Follow-up lung nodule. EXAM: CT CHEST WITH CONTRAST TECHNIQUE: Multidetector CT imaging of the chest was performed during intravenous contrast administration. CONTRAST:  23m OMNIPAQUE IOHEXOL 300 MG/ML  SOLN COMPARISON:  11/23/2018 FINDINGS: Cardiovascular: Normal heart size. No pericardial effusion. 4 cm right ventricular outflow track is identified. Findings may reflect PA hypertension. Aortic atherosclerosis. Lad, left circumflex and RCA coronary artery calcifications. Mediastinum/Nodes: Normal appearance of the thyroid gland. The trachea appears patent and is midline. Normal appearance of the esophagus. Prominent mediastinal lymph nodes are identified including 1.6 cm right paratracheal lymph node, image 52/2. Previously this measured the same. 1.5 cm subcarinal lymph node is identified, image 72/2. Previously 1.3 cm. Enlarged right hilar node measures 2 cm, image 64/2. This is difficult to compare with the previous exam as the prior study was performed without IV contrast material. Lungs/Pleura: No pleural effusion identified. Diffuse bilateral interstitial reticulation is identified which has a lower lung zone predominance. Mild bronchiectasis. A mosaic attenuation pattern is noted bilaterally. Right middle lobe lung nodule is unchanged measuring 4 mm, image 93/3. right upper lobe lung nodule measuring 4 mm is stable from previous exam, image 46/3. Upper Abdomen: Unchanged 1.5 cm low-density left adrenal nodule. Previous cholecystectomy. Left upper pole kidney cysts noted. Musculoskeletal: Spondylosis within the thoracic spine.  Unchanged lower thoracic spine compression fracture IMPRESSION: 1. Unchanged right upper lobe and right middle lobe lung nodules. No new or enlarging pulmonary nodules identified. No follow-up needed if patient is low-risk (and has no known or suspected primary neoplasm). Non-contrast chest CT can be considered at 12 months (from 11/23/2018) if patient is high-risk. This recommendation follows the consensus statement: Guidelines for Management of Incidental Pulmonary Nodules Detected on CT Images: From the Fleischner Society 2017; Radiology 2017; 284:228-243. 2. Chronic interstitial lung disease with lower lung zone predominance and mild bronchiectasis. As mentioned previously findings may reflect nonspecific interstitial pneumonitis or early usual interstitial pneumonitis. 3. Multi vessel coronary artery calcifications noted. 4. Stable enlarged mediastinal and right hilar lymph nodes. 5. Stable left adrenal nodule. 6. Stable lower thoracic spine compression fracture. Enlargement of the right ventricular outflow tract may reflect underlying PA hypertension. 7. Unchanged lower thoracic spine compression fracture. Aortic Atherosclerosis (ICD10-I70.0). Electronically Signed   By: TKerby MoorsM.D.   On: 03/27/2019 15:45    Assessment/Plan 1. Essential hypertension Off antihypertensives blood pressure is normal  2. Hyperlipidemia with target LDL less than 70 Continues to take atorvastatin 20 mg.  LDL at goal  3. ILD (interstitial lung disease) ([place] Chronic O2 treatment  4. MCI (mild cognitive impairment) with memory loss No change from last visit.  Patient could not tell me date or year  5. Persistent atrial fibrillation ([place]:  CHA2DS2-VASc Score 4; On Eliquis.  Diltiazem for rate rhythm     Family/ staff Communication:   Labs/tests ordered:none  SLillette Boxer MSabra Heck [place]  [place], [place] Office [phone]

## 2022-06-17 ENCOUNTER — Encounter: Payer: Self-pay | Admitting: Nurse Practitioner

## 2022-06-17 ENCOUNTER — Non-Acute Institutional Stay (SKILLED_NURSING_FACILITY): Payer: Medicare Other | Admitting: Nurse Practitioner

## 2022-06-17 DIAGNOSIS — K5900 Constipation, unspecified: Secondary | ICD-10-CM

## 2022-06-17 DIAGNOSIS — F419 Anxiety disorder, unspecified: Secondary | ICD-10-CM

## 2022-06-17 DIAGNOSIS — M25562 Pain in left knee: Secondary | ICD-10-CM

## 2022-06-17 DIAGNOSIS — I4819 Other persistent atrial fibrillation: Secondary | ICD-10-CM

## 2022-06-17 DIAGNOSIS — G3184 Mild cognitive impairment, so stated: Secondary | ICD-10-CM

## 2022-06-17 DIAGNOSIS — I5030 Unspecified diastolic (congestive) heart failure: Secondary | ICD-10-CM

## 2022-06-17 DIAGNOSIS — G8929 Other chronic pain: Secondary | ICD-10-CM

## 2022-06-17 DIAGNOSIS — N3942 Incontinence without sensory awareness: Secondary | ICD-10-CM | POA: Diagnosis not present

## 2022-06-17 DIAGNOSIS — G4733 Obstructive sleep apnea (adult) (pediatric): Secondary | ICD-10-CM

## 2022-06-17 DIAGNOSIS — D509 Iron deficiency anemia, unspecified: Secondary | ICD-10-CM

## 2022-06-17 DIAGNOSIS — E785 Hyperlipidemia, unspecified: Secondary | ICD-10-CM

## 2022-06-17 DIAGNOSIS — I10 Essential (primary) hypertension: Secondary | ICD-10-CM

## 2022-06-17 DIAGNOSIS — K219 Gastro-esophageal reflux disease without esophagitis: Secondary | ICD-10-CM

## 2022-06-17 DIAGNOSIS — J849 Interstitial pulmonary disease, unspecified: Secondary | ICD-10-CM

## 2022-06-17 NOTE — Assessment & Plan Note (Signed)
uses mechanical lift for transfer, takes prn Tylenol.

## 2022-06-17 NOTE — Assessment & Plan Note (Signed)
refused CPAP 

## 2022-06-17 NOTE — Assessment & Plan Note (Signed)
stable, on MiraLax

## 2022-06-17 NOTE — Progress Notes (Signed)
Location:  [place] Room Number: NO/05/A Place of Service:  SNF (31) Provider:  Caralee Morea X, NP  Patient Care Team: Virgie Dad, MD as PCP - General (Internal Medicine) Leonie Ursala Cressy, MD as PCP - Cardiology (Cardiology)  Extended Emergency Contact Information Primary Emergency Contact: [facility] Address: [address]          [place], [place] [address] [name] of [place] Phone: [phone] Mobile Phone: [phone] Relation: Spouse Secondary Emergency Contact: Janey Genta Address: [address] [place] [address]          [place], [place] Phone: [phone] Mobile Phone: [phone] Relation: Daughter  Code Status:  DNR Goals of care: Advanced Directive information    06/17/2022    9:34 AM  Advanced Directives  Does Patient Have a Medical Advance Directive? Yes  Type of Advance Directive Living will;Out of facility DNR (pink MOST or yellow form)  Does patient want to make changes to medical advance directive? No - Patient declined     Chief Complaint  Patient presents with   Medical Management of Chronic Issues    Patient is here for a follow up for chronic conditions     HPI:  Pt is a 89 y.o. male seen today for medical management of chronic diseases.        Gait abnormality, mechanical lift for transfer, w/c for mobility             Mild cognitive impairment, SNF [facility] for supportive care.  Urinary incontinent, no urinary retention, on Mirabegron, Tamsulosin              Constipation, stable, on MiraLax             OA, uses mechanical lift for transfer, takes prn Tylenol.             Hx of Interstitial lung disease, O2 dependent, no further f/u Pulmonology             CHF/minimal edema BLE, on Furosamide. Bun/creat 18/0.96 03/10/22 Anxiety/depression, stable, on Lexapro.              Afib, heart rate is in control, on Diltiazem, Eliquis, TSH 3.18 03/10/22             GERD, stable, on Esomeprazole              Anemia, stable, off Fe. Hgb 14.6 03/10/22             HTN,  off  Losartan 04/04/22 2/2 low Bp, on Diltiazem, Furosemide, Bun/creat 18/0.96 03/10/22             OSA refused CPAP             Hyperlipidemia, takes Atorvastatin, LDL 70 10/22/21  Past Medical History:  Diagnosis Date   Anxiety    CAD S/P percutaneous coronary angioplasty 1996, 2009, 01/2010   PTCA of L Cx 1996; BMS-RCA [facility]) 2009; Staged PCI RCA (70% ISR) --> 3.0 mm x 23 mm BMS, staged PCI Diag - 2.0 mm x 12 mm Mini Vision BMS (2.25 mm)   Cataracts, bilateral    immature   Chronic back pain    scoliosis--pt states unable to have surgery bc of age   GERD (gastroesophageal reflux disease)    takes Nexium daily   History of colon polyps    Hyperlipidemia    Hypertension    Myocardial infarction [facility]) 1996, 2009   x 2;last time was about  8-98yr ago    Obesity (BMI 30.0-34.9)    OSA (obstructive sleep apnea)    Osteoarthritis of both knees    Peripheral edema    Venous Stasis   Persistent atrial fibrillation (HCC)    Unable to maintaine NSR after DCCV with Tikosyn.  Plan = rate control w/diltiazem (beta blocker off b/c fatigue). CHA2DS2Vasc 4. DOAC - Eliquis.    Prostate cancer ([place] 2005   seed implant   Past Surgical History:  Procedure Laterality Date   CARDIOVERSION N/A 06/12/2015   Procedure: CARDIOVERSION;  Surgeon: MSanda Klein MD;  [facility];  Service: Cardiovascular;  Laterality: N/A;   CARDIOVERSION N/A 08/02/2015   Procedure: CARDIOVERSION;  Surgeon: JThompson Grayer MD;  [place]  Service: Cardiovascular;  Laterality: N/A;   [place] 2009, August 2011   PTCA of L Cx 1996; BMS-RCA ([facility] 2009; Staged PCI RCA (70% ISR) --> 3.0 mm x 23 mm BMS, staged PCI Diag - 2.0 mm x 12 mm Mini Vision BMS (2.25 mm)   cyst removed  in college   NEUROPLASTY / TRANSPOSITION MEDIAN NERVE AT CARPAL TUNNEL BILATERAL     NM  MYOVIEW LTD  November 2013   EF 53%; fixed mid inferior-inferolateral defect, infarct with no ischemia.   right knee arthrsoscopy  89yrago   right trigger finger     seeds placed into prostate   2005   TEE WITHOUT CARDIOVERSION N/A 06/12/2015   Procedure: TRANSESOPHAGEAL ECHOCARDIOGRAM (TEE) - WITH CARDIOVERSION;  Surgeon: MiSanda KleinMD;  [place] Service: Cardiovascular: EF 55-60%.No LA or LAA thrombus. Normal Peal PAP ~33 mmHg.   TOTAL KNEE ARTHROPLASTY  05/09/2012   Procedure: TOTAL KNEE ARTHROPLASTY;  Surgeon: FrKerin SalenMD;  [place] Service: Orthopedics;  Laterality: Right;   TRANSTHORACIC ECHOCARDIOGRAM  06/2015   EF 60-65%. Mild LA dilation. Normal PA pressures: 32 mmHg. Aortic sclerosis but no stenosis.   wisdom teeth extraccted      Allergies  Allergen Reactions   Crab [Shellfish Allergy] Rash   Codeine    Hydrocodone Hives   Adhesive [Tape] Rash   Oxycodone Rash    Outpatient Encounter Medications as of 06/17/2022  Medication Sig   acetaminophen (TYLENOL) 325 MG tablet Take 650 mg by mouth every 6 (six) hours as needed.   atorvastatin (LIPITOR) 20 MG tablet Take 20 mg by mouth daily.   diltiazem (DILACOR XR) 240 MG 24 hr capsule Take 240 mg by mouth daily. For heart failure   ELIQUIS 5 MG TABS tablet TAKE 1 TABLET TWICE A DAY (SCHEDULE FOLLOW UP WITH CARDIOLOGIST PRIOR TO NEXT REFILL REQUEST)   escitalopram (LEXAPRO) 10 MG tablet Take 10 mg by mouth daily.   esomeprazole (NEXIUM) 40 MG capsule TAKE 1 CAPSULE DAILY   furosemide (LASIX) 40 MG tablet Take 40 mg by mouth daily.   Menthol, Topical Analgesic, (BIOFREEZE) 4 % GEL Apply 1 application topically in the morning, at noon, in the evening, and at bedtime. Apply to right great toe   mirabegron ER (MYRBETRIQ) 25 MG TB24 tablet Take 25 mg by mouth daily.    nitroGLYCERIN (NITROSTAT) 0.4 MG SL tablet Place 0.4 mg under the tongue as needed for chest pain.   Polyethylene Glycol 3350 (MIRALAX  PO) Take 17 g by mouth daily.   Sodium Fluoride (PREVIDENT 5000 BOOSTER PLUS) 1.1 % PSTE Place 1 application onto teeth  at bedtime.   tamsulosin (FLOMAX) 0.4 MG CAPS capsule Take 0.4 mg by mouth daily.    No facility-administered encounter medications on file as of 06/17/2022.    Review of Systems  Constitutional:  Negative for appetite change, fatigue and fever.  HENT:  Positive for hearing loss. Negative for congestion and trouble swallowing.   Eyes:  Negative for visual disturbance.  Respiratory:  Positive for shortness of breath. Negative for cough.        O2 dependent. DOE  Cardiovascular:  Negative for leg swelling.  Gastrointestinal:  Negative for abdominal pain and constipation.       Incontinent of feces.   Genitourinary:  Positive for frequency. Negative for dysuria and urgency.       Incontinent of urine.   Musculoskeletal:  Positive for arthralgias and gait problem.       Left knee pain is chronic.   Skin:  Negative for color change.  Neurological:  Negative for speech difficulty, weakness and headaches.       Memory lapses.   Psychiatric/Behavioral:  Negative for behavioral problems and sleep disturbance. The patient is nervous/anxious.        Anxious at times.     Immunization History  Administered Date(s) Administered   H1N1 04/13/2010   Influenza, High Dose Seasonal PF 02/12/2014, 03/29/2018, 01/05/2019, 04/04/2022   Influenza-Unspecified 03/06/2013, 02/12/2014, 03/06/2017, 03/17/2020, 03/24/2021   Moderna SARS-COV2 Booster Vaccination 11/03/2020, 10/22/2021   Moderna Sars-Covid-2 Vaccination 06/08/2019, 07/06/2019, 04/14/2020   PFIZER(Purple Top)SARS-COV-2 Vaccination 02/23/2021   Pfizer Covid-19 Vaccine Bivalent Booster 27yr & up 04/07/2022   Pneumococcal Conjugate-13 02/12/2014   Pneumococcal Polysaccharide-23 02/04/2013, 02/12/2014   Td 09/04/2005   Tdap 12/11/2012   Zoster Recombinat (Shingrix) 08/27/2021, 10/27/2021   Zoster, Live 07/07/2006    Pertinent  Health Maintenance Due  Topic Date Due   INFLUENZA VACCINE  Completed      01/30/2019    5:00 PM 04/01/2022    2:39 PM 06/17/2022    9:34 AM  Fall Risk  Falls in the past year?  0 0  Was there an injury with Fall?  0 0  Fall Risk Category Calculator  0 0  Fall Risk Category  Low Low  Patient Fall Risk Level Low fall risk Low fall risk Low fall risk  Patient at Risk for Falls Due to  No Fall Risks No Fall Risks  Fall risk Follow up  Falls evaluation completed Falls evaluation completed   Functional Status Survey:    Vitals:   06/17/22 0932  BP: 122/70  Pulse: 65  Resp: 16  Temp: (!) 97.1 F (36.2 C)  SpO2: 92%  Weight: 177 lb (80.3 kg)  Height: '5\' 7"'$  (1.702 m)   Body mass index is 27.72 kg/m. Physical Exam Vitals and nursing note reviewed.  Constitutional:      Appearance: Normal appearance.  HENT:     Head: Normocephalic and atraumatic.  Eyes:     Extraocular Movements: Extraocular movements intact.     Conjunctiva/sclera: Conjunctivae normal.     Pupils: Pupils are equal, round, and reactive to light.  Cardiovascular:     Rate and Rhythm: Normal rate. Rhythm irregular.     Heart sounds: No murmur heard. Pulmonary:     Breath sounds: No wheezing.     Comments: O2 dependent.  Abdominal:     General: Bowel sounds are normal.     Palpations: Abdomen is soft.     Tenderness: There is no abdominal tenderness.  Musculoskeletal:  Cervical back: Normal range of motion and neck supple.     Right lower leg: No edema.     Left lower leg: No edema.     Comments: Mechanical lift for transfer.   Skin:    General: Skin is warm and dry.  Neurological:     General: No focal deficit present.     Mental Status: He is alert and oriented to person, place, and time. Mental status is at baseline.     Gait: Gait abnormal.  Psychiatric:        Mood and Affect: Mood normal.        Behavior: Behavior normal.        Thought Content: Thought content normal.      Labs reviewed: Recent Labs    10/22/21 0000 03/10/22 0000  NA 141 137  K 3.8 4.3  CL 102 100  CO2  --  25*  BUN 22* 18  CREATININE 0.8 1.0  CALCIUM  --  9.3   Recent Labs    03/10/22 0000  AST 12*  ALT 3*  ALKPHOS 70  ALBUMIN 3.7   Recent Labs    03/10/22 0000  WBC 8.2  NEUTROABS 5,707.00  HGB 14.6  HCT 42  PLT 219   Lab Results  Component Value Date   TSH 3.18 03/10/2022   No results found for: "HGBA1C" Lab Results  Component Value Date   CHOL 124 10/22/2021   HDL 39 10/22/2021   LDLCALC 70 10/22/2021   TRIG 69 10/22/2021   CHOLHDL 2.9 02/12/2014    Significant Diagnostic Results in last 30 days:  No results found.  Assessment/Plan MCI (mild cognitive impairment) with memory loss SNF [facility] for supportive care.   Urinary incontinence no urinary retention, on Mirabegron, Tamsulosin   Constipation  stable, on MiraLax  Left knee pain uses mechanical lift for transfer, takes prn Tylenol.  ILD (interstitial lung disease) (HCC)  Hx of Interstitial lung disease, O2 dependent, no further f/u Pulmonology  Diastolic CHF ([place]) CHF/minimal edema BLE, on Furosamide. Bun/creat 18/0.96 03/10/22  Anxiety stable, on Lexapro.   Persistent atrial fibrillation ([place]):  CHA2DS2-VASc Score 4; On Eliquis  heart rate is in control, on Diltiazem, Eliquis, TSH 3.18 03/10/22  GERD (gastroesophageal reflux disease) stable, on Esomeprazole  Iron deficiency anemia stable, off Fe. Hgb 14.6 03/10/22  Essential hypertension Blood pressure is controlled, off  Losartan 04/04/22 2/2 low Bp, on Diltiazem, Furosemide, Bun/creat 18/0.96 03/10/22  OSA (obstructive sleep apnea)  refused CPAP     Family/ staff Communication: plan of care reviewed with the patient and charge nurse.   Labs/tests ordered:  none  Time spend 35 minutes.

## 2022-06-17 NOTE — Assessment & Plan Note (Signed)
stable, on Esomeprazole

## 2022-06-17 NOTE — Assessment & Plan Note (Signed)
takes Atorvastatin, LDL 70 10/22/21

## 2022-06-17 NOTE — Assessment & Plan Note (Signed)
Blood pressure is controlled, off  Losartan 04/04/22 2/2 low Bp, on Diltiazem, Furosemide, Bun/creat 18/0.96 03/10/22

## 2022-06-17 NOTE — Assessment & Plan Note (Signed)
heart rate is in control, on Diltiazem, Eliquis, TSH 3.18 03/10/22

## 2022-06-17 NOTE — Assessment & Plan Note (Signed)
stable, on Lexapro.

## 2022-06-17 NOTE — Assessment & Plan Note (Signed)
SNF [facility] for supportive care.  

## 2022-06-17 NOTE — Assessment & Plan Note (Signed)
no urinary retention, on Mirabegron, Tamsulosin

## 2022-06-17 NOTE — Assessment & Plan Note (Signed)
CHF/minimal edema BLE, on Furosamide. Bun/creat 18/0.96 03/10/22

## 2022-06-17 NOTE — Assessment & Plan Note (Signed)
stable, off Fe. Hgb 14.6 03/10/22

## 2022-06-17 NOTE — Assessment & Plan Note (Signed)
Hx of Interstitial lung disease, O2 dependent, no further f/u Pulmonology

## 2022-07-11 ENCOUNTER — Encounter: Payer: Self-pay | Admitting: Nurse Practitioner

## 2022-07-11 ENCOUNTER — Non-Acute Institutional Stay (SKILLED_NURSING_FACILITY): Payer: Medicare Other | Admitting: Nurse Practitioner

## 2022-07-11 DIAGNOSIS — J849 Interstitial pulmonary disease, unspecified: Secondary | ICD-10-CM

## 2022-07-11 DIAGNOSIS — M1711 Unilateral primary osteoarthritis, right knee: Secondary | ICD-10-CM

## 2022-07-11 DIAGNOSIS — K5900 Constipation, unspecified: Secondary | ICD-10-CM

## 2022-07-11 DIAGNOSIS — F419 Anxiety disorder, unspecified: Secondary | ICD-10-CM

## 2022-07-11 DIAGNOSIS — D509 Iron deficiency anemia, unspecified: Secondary | ICD-10-CM

## 2022-07-11 DIAGNOSIS — I10 Essential (primary) hypertension: Secondary | ICD-10-CM

## 2022-07-11 DIAGNOSIS — K219 Gastro-esophageal reflux disease without esophagitis: Secondary | ICD-10-CM

## 2022-07-11 DIAGNOSIS — E785 Hyperlipidemia, unspecified: Secondary | ICD-10-CM

## 2022-07-11 DIAGNOSIS — I4819 Other persistent atrial fibrillation: Secondary | ICD-10-CM

## 2022-07-11 DIAGNOSIS — N3942 Incontinence without sensory awareness: Secondary | ICD-10-CM

## 2022-07-11 DIAGNOSIS — G3184 Mild cognitive impairment, so stated: Secondary | ICD-10-CM

## 2022-07-11 DIAGNOSIS — I5030 Unspecified diastolic (congestive) heart failure: Secondary | ICD-10-CM

## 2022-07-11 NOTE — Assessment & Plan Note (Signed)
heart rate is in control, on Diltiazem, Eliquis, TSH 3.18 03/10/22

## 2022-07-11 NOTE — Progress Notes (Unsigned)
Location:   SNF [place] Room Number: NO/05/A Place of Service:  SNF (31) Provider: Marlana Latus NP   Patient Care Team: Virgie Dad, MD as PCP - General (Internal Medicine) Leonie Pleasant Bensinger, MD as PCP - Cardiology (Cardiology)  Extended Emergency Contact Information Primary Emergency Contact: [facility] Address: [address]          [place], [place] [address] [name] of [place] Phone: [phone] Mobile Phone: [phone] Relation: Spouse Secondary Emergency Contact: Janey Genta Address: [address]          [place], [place] Phone: [phone] Mobile Phone: [phone] Relation: Daughter  Code Status:  DNR Goals of care: Advanced Directive information    07/11/2022    4:29 PM  Advanced Directives  Does Patient Have a Medical Advance Directive? Yes  Type of Advance Directive Living will;Out of facility DNR (pink MOST or yellow form)  Does patient want to make changes to medical advance directive? No - Patient declined     Chief Complaint  Patient presents with   Medical Management of Chronic Issues    HPI:  Pt is a 89 y.o. male seen today for medical management of chronic diseases.     Gait abnormality, mechanical lift for transfer, w/c for mobility             Mild cognitive impairment, SNF [facility] for supportive care.  Urinary incontinent, no urinary retention, on Mirabegron, Tamsulosin              Constipation, stable, on MiraLax             OA, uses mechanical lift for transfer, takes prn Tylenol.             Hx of Interstitial lung disease, O2 dependent, no further f/u Pulmonology             CHF/minimal edema BLE, on Furosamide. Bun/creat 18/0.96 03/10/22 Anxiety/depression, stable, on Lexapro.              Afib, heart rate is in control, on Diltiazem, Eliquis, TSH 3.18 03/10/22             GERD, stable, on Esomeprazole             Anemia, stable, off Fe. Hgb 14.6 03/10/22             HTN,  off   Losartan 04/04/22 2/2 low Bp, on Diltiazem, Furosemide, Bun/creat 18/0.96 03/10/22             OSA refused CPAP             Hyperlipidemia, takes Atorvastatin, LDL 70 10/22/21  Past Medical History:  Diagnosis Date   Anxiety    CAD S/P percutaneous coronary angioplasty 1996, 2009, 01/2010   PTCA of L Cx 1996; BMS-RCA [facility]) 2009; Staged PCI RCA (70% ISR) --> 3.0 mm x 23 mm BMS, staged PCI Diag - 2.0 mm x 12 mm Mini Vision BMS (2.25 mm)   Cataracts, bilateral    immature   Chronic back pain    scoliosis--pt states unable to have surgery bc of age   GERD (gastroesophageal reflux disease)    takes Nexium daily   History of colon polyps    Hyperlipidemia    Hypertension    Myocardial infarction ([place]) 1996, 2009   x 2;last time was about 8-63yr ago    Obesity (BMI 30.0-34.9)    OSA (obstructive sleep apnea)  Osteoarthritis of both knees    Peripheral edema    Venous Stasis   Persistent atrial fibrillation (HCC)    Unable to maintaine NSR after DCCV with Tikosyn.  Plan = rate control w/diltiazem (beta blocker off b/c fatigue). CHA2DS2Vasc 4. DOAC - Eliquis.    Prostate cancer ([place]) 2005   seed implant   Past Surgical History:  Procedure Laterality Date   CARDIOVERSION N/A 06/12/2015   Procedure: CARDIOVERSION;  Surgeon: Sanda Klein, MD;  Location: [place] [facility];  Service: Cardiovascular;  Laterality: N/A;   CARDIOVERSION N/A 08/02/2015   Procedure: CARDIOVERSION;  Surgeon: [name], MD;  Location: [place];  Service: Cardiovascular;  Laterality: N/A;   [place], 2009, August 2011   PTCA of L Cx 1996; BMS-RCA [facility]) 2009; Staged PCI RCA (70% ISR) --> 3.0 mm x 23 mm BMS, staged PCI Diag - 2.0 mm x 12 mm Mini Vision BMS (2.25 mm)   cyst removed  in college   NEUROPLASTY / TRANSPOSITION MEDIAN NERVE AT CARPAL TUNNEL BILATERAL     NM MYOVIEW LTD  November 2013   EF 53%; fixed mid inferior-inferolateral  defect, infarct with no ischemia.   right knee arthrsoscopy  54yr ago   right trigger finger     seeds placed into prostate   2005   TEE WITHOUT CARDIOVERSION N/A 06/12/2015   Procedure: TRANSESOPHAGEAL ECHOCARDIOGRAM (TEE) - WITH CARDIOVERSION;  Surgeon: MSanda Klein MD;  [place]  Service: Cardiovascular: EF 55-60%.No LA or LAA thrombus. Normal Peal PAP ~33 mmHg.   TOTAL KNEE ARTHROPLASTY  05/09/2012   Procedure: TOTAL KNEE ARTHROPLASTY;  Surgeon: FKerin Salen MD;  [place]  Service: Orthopedics;  Laterality: Right;   TRANSTHORACIC ECHOCARDIOGRAM  06/2015   EF 60-65%. Mild LA dilation. Normal PA pressures: 32 mmHg. Aortic sclerosis but no stenosis.   wisdom teeth extraccted      Allergies  Allergen Reactions   Crab [Shellfish Allergy] Rash   Codeine    Hydrocodone Hives   Adhesive [Tape] Rash   Oxycodone Rash    Allergies as of 07/11/2022       Reactions   Crab [shellfish Allergy] Rash   Codeine    Hydrocodone Hives   Adhesive [tape] Rash   Oxycodone Rash        Medication List        Accurate as of July 11, 2022 11:59 PM. If you have any questions, ask your nurse or doctor.          acetaminophen 325 MG tablet Commonly known as: TYLENOL Take 650 mg by mouth every 6 (six) hours as needed.   atorvastatin 20 MG tablet Commonly known as: LIPITOR Take 20 mg by mouth daily.   Biofreeze 4 % Gel Generic drug: Menthol (Topical Analgesic) Apply 1 application topically in the morning, at noon, in the evening, and at bedtime. Apply to right great toe   diltiazem 240 MG 24 hr capsule Commonly known as: DILACOR XR Take 240 mg by mouth daily. For heart failure   Eliquis 5 MG Tabs tablet Generic drug: apixaban TAKE 1 TABLET TWICE A DAY (SCHEDULE FOLLOW UP WITH CARDIOLOGIST PRIOR TO NEXT REFILL REQUEST)   escitalopram 10 MG tablet Commonly known as: LEXAPRO Take 10 mg by mouth daily.   esomeprazole 40 MG capsule Commonly known as:  NEXIUM TAKE 1 CAPSULE DAILY   furosemide 40 MG tablet Commonly known as:  LASIX Take 40 mg by mouth daily.   mirabegron ER 25 MG Tb24 tablet Commonly known as: MYRBETRIQ Take 25 mg by mouth daily.   MIRALAX PO Take 17 g by mouth daily.   nitroGLYCERIN 0.4 MG SL tablet Commonly known as: NITROSTAT Place 0.4 mg under the tongue as needed for chest pain.   PreviDent 5000 Booster Plus 1.1 % Pste Generic drug: Sodium Fluoride Place 1 application onto teeth at bedtime.   tamsulosin 0.4 MG Caps capsule Commonly known as: FLOMAX Take 0.4 mg by mouth daily.        Review of Systems  Constitutional:  Negative for appetite change, fatigue and fever.  HENT:  Positive for hearing loss. Negative for congestion and trouble swallowing.   Eyes:  Negative for visual disturbance.  Respiratory:  Positive for shortness of breath. Negative for cough.        O2 dependent. DOE  Cardiovascular:  Negative for leg swelling.  Gastrointestinal:  Negative for abdominal pain and constipation.       Incontinent of feces.   Genitourinary:  Positive for frequency. Negative for dysuria and urgency.       Incontinent of urine.   Musculoskeletal:  Positive for arthralgias and gait problem.       Left knee pain is chronic.   Skin:  Negative for color change.  Neurological:  Negative for speech difficulty, weakness and headaches.       Memory lapses.   Psychiatric/Behavioral:  Negative for behavioral problems and sleep disturbance. The patient is nervous/anxious.        Anxious at times.     Immunization History  Administered Date(s) Administered   H1N1 04/13/2010   Influenza, High Dose Seasonal PF 02/12/2014, 03/29/2018, 01/05/2019, 04/04/2022   Influenza-Unspecified 03/06/2013, 02/12/2014, 03/06/2017, 03/17/2020, 03/24/2021   Moderna SARS-COV2 Booster Vaccination 11/03/2020, 10/22/2021   Moderna Sars-Covid-2 Vaccination 06/08/2019, 07/06/2019, 04/14/2020   PFIZER(Purple Top)SARS-COV-2 Vaccination  02/23/2021   Pfizer Covid-19 Vaccine Bivalent Booster 93yr & up 04/07/2022   Pneumococcal Conjugate-13 02/12/2014   Pneumococcal Polysaccharide-23 02/04/2013, 02/12/2014   Td 09/04/2005   Tdap 12/11/2012   Zoster Recombinat (Shingrix) 08/27/2021, 10/27/2021   Zoster, Live 07/07/2006   Pertinent  Health Maintenance Due  Topic Date Due   INFLUENZA VACCINE  Completed      01/30/2019    5:00 PM 04/01/2022    2:39 PM 06/17/2022    9:34 AM 07/11/2022    4:29 PM  Fall Risk  Falls in the past year?  0 0 0  Was there an injury with Fall?  0 0   Fall Risk Category Calculator  0 0   Fall Risk Category (Retired)  Low Low   (RETIRED) Patient Fall Risk Level Low fall risk Low fall risk Low fall risk   Patient at Risk for Falls Due to  No Fall Risks No Fall Risks No Fall Risks  Fall risk Follow up  Falls evaluation completed Falls evaluation completed Falls evaluation completed   Functional Status Survey:    Vitals:   07/11/22 1321  BP: 138/82  Pulse: 65  Resp: 16  Temp: (!) 97.1 F (36.2 C)  SpO2: 92%  Weight: 184 lb 9.6 oz (83.7 kg)  Height: '5\' 7"'$  (1.702 m)   Body mass index is 28.91 kg/m. Physical Exam Vitals and nursing note reviewed.  Constitutional:      Appearance: Normal appearance.  HENT:     Head: Normocephalic and atraumatic.  Eyes:     Extraocular Movements: Extraocular movements  intact.     Conjunctiva/sclera: Conjunctivae normal.     Pupils: Pupils are equal, round, and reactive to light.  Cardiovascular:     Rate and Rhythm: Normal rate. Rhythm irregular.     Heart sounds: No murmur heard. Pulmonary:     Breath sounds: No wheezing.     Comments: O2 dependent.  Abdominal:     General: Bowel sounds are normal.     Palpations: Abdomen is soft.     Tenderness: There is no abdominal tenderness.  Musculoskeletal:     Cervical back: Normal range of motion and neck supple.     Right lower leg: No edema.     Left lower leg: No edema.     Comments: Mechanical  lift for transfer.   Skin:    General: Skin is warm and dry.  Neurological:     General: No focal deficit present.     Mental Status: He is alert and oriented to person, place, and time. Mental status is at baseline.     Gait: Gait abnormal.  Psychiatric:        Mood and Affect: Mood normal.        Behavior: Behavior normal.        Thought Content: Thought content normal.     Labs reviewed: Recent Labs    10/22/21 0000 03/10/22 0000  NA 141 137  K 3.8 4.3  CL 102 100  CO2  --  25*  BUN 22* 18  CREATININE 0.8 1.0  CALCIUM  --  9.3   Recent Labs    03/10/22 0000  AST 12*  ALT 3*  ALKPHOS 70  ALBUMIN 3.7   Recent Labs    03/10/22 0000  WBC 8.2  NEUTROABS 5,707.00  HGB 14.6  HCT 42  PLT 219   Lab Results  Component Value Date   TSH 3.18 03/10/2022   No results found for: "HGBA1C" Lab Results  Component Value Date   CHOL 124 10/22/2021   HDL 39 10/22/2021   LDLCALC 70 10/22/2021   TRIG 69 10/22/2021   CHOLHDL 2.9 02/12/2014    Significant Diagnostic Results in last 30 days:  No results found.  Assessment/Plan  Urinary incontinence  no urinary retention, on Mirabegron, Tamsulosin   Constipation stable, on MiraLax  Osteoarthritis of right knee uses mechanical lift for transfer, takes prn Tylenol.  ILD (interstitial lung disease) (HCC) Hx of Interstitial lung disease, O2 dependent, no further f/u Pulmonology  Diastolic CHF ([place])  CHF/minimal edema BLE, on Furosamide. Bun/creat 18/0.96 03/10/22  Anxiety  stable, on Lexapro.   Persistent atrial fibrillation ([place]):  CHA2DS2-VASc Score 4; On Eliquis heart rate is in control, on Diltiazem, Eliquis, TSH 3.18 03/10/22  GERD (gastroesophageal reflux disease) stable, on Esomeprazole  Iron deficiency anemia  stable, off Fe. Hgb 14.6 03/10/22  Essential hypertension Blood pressure is controlled,  off  Losartan 04/04/22 2/2 low Bp, on Diltiazem, Furosemide, Bun/creat 18/0.96  03/10/22  Hyperlipidemia with target LDL less than 70 takes Atorvastatin, LDL 70 10/22/21   MCI (mild cognitive impairment) with memory loss SNF [facility] for supportive care.    Family/ staff Communication: plan of care reviewed with the patient and charge nurse.   Labs/tests ordered:  none  Time spend 35 minutes.

## 2022-07-11 NOTE — Assessment & Plan Note (Signed)
stable, on Lexapro.

## 2022-07-11 NOTE — Assessment & Plan Note (Signed)
SNF [facility] for supportive care.  

## 2022-07-11 NOTE — Assessment & Plan Note (Signed)
CHF/minimal edema BLE, on Furosamide. Bun/creat 18/0.96 03/10/22

## 2022-07-11 NOTE — Assessment & Plan Note (Signed)
Blood pressure is controlled,  off  Losartan 04/04/22 2/2 low Bp, on Diltiazem, Furosemide, Bun/creat 18/0.96 03/10/22

## 2022-07-11 NOTE — Assessment & Plan Note (Signed)
uses mechanical lift for transfer, takes prn Tylenol.

## 2022-07-11 NOTE — Assessment & Plan Note (Signed)
stable, on Esomeprazole

## 2022-07-11 NOTE — Assessment & Plan Note (Signed)
stable, off Fe. Hgb 14.6 03/10/22

## 2022-07-11 NOTE — Assessment & Plan Note (Signed)
Hx of Interstitial lung disease, O2 dependent, no further f/u Pulmonology

## 2022-07-11 NOTE — Assessment & Plan Note (Signed)
takes Atorvastatin, LDL 70 10/22/21

## 2022-07-11 NOTE — Assessment & Plan Note (Signed)
no urinary retention, on Mirabegron, Tamsulosin

## 2022-07-11 NOTE — Assessment & Plan Note (Signed)
stable, on MiraLax

## 2022-08-30 ENCOUNTER — Encounter: Payer: Self-pay | Admitting: Family Medicine

## 2022-08-30 NOTE — Progress Notes (Unsigned)
Location:  [facility] [place] Room Number: [place] of Service:  SNF [phone]) Provider:  Dr. Alain Honey  PCP: Virgie Dad, MD  Patient Care Team: Virgie Dad, MD as PCP - General (Internal Medicine) Leonie Man, MD as PCP - Cardiology (Cardiology)  Extended Emergency Contact Information Primary Emergency Contact: [facility] Address: [address]          [place], [place] [address] [name] of [place] Phone: [phone] Mobile Phone: [phone] Relation: Spouse Secondary Emergency Contact: Janey Genta Address: [address]          [place], [place] Phone: [phone] Mobile Phone: [phone] Relation: Daughter  Code Status:  DNR Goals of care: Advanced Directive information    08/30/2022   12:26 PM  Advanced Directives  Does Patient Have a Medical Advance Directive? Yes  Type of Paramedic of [place];Out of facility DNR (pink MOST or yellow form);Living will  Does patient want to make changes to medical advance directive? No - Patient declined  Copy of [place] in Chart? Yes - validated most recent copy scanned in chart (See row information)     Chief Complaint  Patient presents with   Medical Management of Chronic Issues    Medical Management of Chronic Issues.     HPI:  Pt is a 89 y.o. male seen today for medical management of chronic diseases.     Past Medical History:  Diagnosis Date   Anxiety    CAD S/P percutaneous coronary angioplasty 1996, 2009, 01/2010   PTCA of L Cx 1996; BMS-RCA [facility]) 2009; Staged PCI RCA (70% ISR) --> 3.0 mm x 23 mm BMS, staged PCI Diag - 2.0 mm x 12 mm Mini Vision BMS (2.25 mm)   Cataracts, bilateral    immature   Chronic back pain    scoliosis--pt states unable to have surgery bc of age   GERD (gastroesophageal reflux disease)    takes Nexium daily   History of colon polyps    Hyperlipidemia     Hypertension    Myocardial infarction ([place]) 1996, 2009   x 2;last time was about 8-24yrs ago    Obesity (BMI 30.0-34.9)    OSA (obstructive sleep apnea)    Osteoarthritis of both knees    Peripheral edema    Venous Stasis   Persistent atrial fibrillation ([place])    Unable to maintaine NSR after DCCV with Tikosyn.  Plan = rate control w/diltiazem (beta blocker off b/c fatigue). CHA2DS2Vasc 4. DOAC - Eliquis.    Prostate cancer ([place]) 2005   seed implant   Past Surgical History:  Procedure Laterality Date   CARDIOVERSION N/A 06/12/2015   Procedure: CARDIOVERSION;  Surgeon: Sanda Klein, MD;  Location: [facility];  Service: Cardiovascular;  Laterality: N/A;   CARDIOVERSION N/A 08/02/2015   Procedure: CARDIOVERSION;  Surgeon: [name], MD;  Location: [facility];  Service: Cardiovascular;  Laterality: N/A;   [place], 2009, August 2011   PTCA of L Cx 1996; BMS-RCA [facility]) 2009; Staged PCI RCA (70% ISR) --> 3.0 mm x 23 mm BMS, staged PCI Diag - 2.0 mm x 12 mm Mini Vision BMS (2.25 mm)   cyst removed  in college   NEUROPLASTY / TRANSPOSITION MEDIAN NERVE AT CARPAL TUNNEL BILATERAL     NM MYOVIEW LTD  November 2013   EF 53%; fixed  mid inferior-inferolateral defect, infarct with no ischemia.   right knee arthrsoscopy  26yrs ago   right trigger finger     seeds placed into prostate   2005   TEE WITHOUT CARDIOVERSION N/A 06/12/2015   Procedure: TRANSESOPHAGEAL ECHOCARDIOGRAM (TEE) - WITH CARDIOVERSION;  Surgeon: Sanda Klein, MD;  Location: [facility];  Service: Cardiovascular: EF 55-60%.No LA or LAA thrombus. Normal Peal PAP ~33 mmHg.   TOTAL KNEE ARTHROPLASTY  05/09/2012   Procedure: TOTAL KNEE ARTHROPLASTY;  Surgeon: Kerin Salen, MD;  Location: [facility];  Service: Orthopedics;  Laterality: Right;   TRANSTHORACIC ECHOCARDIOGRAM  06/2015   EF 60-65%. Mild LA dilation. Normal PA pressures: 32 mmHg. Aortic sclerosis but no  stenosis.   wisdom teeth extraccted      Allergies  Allergen Reactions   Crab [Shellfish Allergy] Rash   Codeine    Hydrocodone Hives   Adhesive [Tape] Rash   Oxycodone Rash    Outpatient Encounter Medications as of 08/30/2022  Medication Sig   acetaminophen (TYLENOL) 325 MG tablet Take 650 mg by mouth every 6 (six) hours as needed.   diltiazem (DILACOR XR) 240 MG 24 hr capsule Take 240 mg by mouth daily. For heart failure   ELIQUIS 5 MG TABS tablet TAKE 1 TABLET TWICE A DAY (SCHEDULE FOLLOW UP WITH CARDIOLOGIST PRIOR TO NEXT REFILL REQUEST)   escitalopram (LEXAPRO) 10 MG tablet Take 10 mg by mouth daily.   esomeprazole (NEXIUM) 40 MG capsule TAKE 1 CAPSULE DAILY   furosemide (LASIX) 40 MG tablet Take 40 mg by mouth daily.   lactose free nutrition (BOOST) LIQD Take 237 mLs by mouth 2 (two) times daily between meals.   mirabegron ER (MYRBETRIQ) 25 MG TB24 tablet Take 25 mg by mouth daily.    nitroGLYCERIN (NITROSTAT) 0.4 MG SL tablet Place 0.4 mg under the tongue as needed for chest pain.   OXYGEN 2lpm   Polyethylene Glycol 3350 (MIRALAX PO) Take 17 g by mouth daily.   Sodium Fluoride (PREVIDENT 5000 BOOSTER PLUS) 1.1 % PSTE Place 1 application onto teeth at bedtime.   tamsulosin (FLOMAX) 0.4 MG CAPS capsule Take 0.4 mg by mouth daily.    [DISCONTINUED] atorvastatin (LIPITOR) 20 MG tablet Take 20 mg by mouth daily.   [DISCONTINUED] Menthol, Topical Analgesic, (BIOFREEZE) 4 % GEL Apply 1 application topically in the morning, at noon, in the evening, and at bedtime. Apply to right great toe   No facility-administered encounter medications on file as of 08/30/2022.    Review of Systems  Immunization History  Administered Date(s) Administered   Diphtheria 12/11/2012   H1N1 04/13/2010   Influenza, High Dose Seasonal PF 02/12/2014, 03/29/2018, 01/05/2019, 04/04/2022   Influenza-Unspecified 03/06/2013, 02/12/2014, 03/06/2017, 03/17/2020, 03/24/2021   Moderna SARS-COV2 Booster  Vaccination 11/03/2020, 10/22/2021   Moderna Sars-Covid-2 Vaccination 06/08/2019, 07/06/2019, 04/14/2020   PFIZER(Purple Top)SARS-COV-2 Vaccination 02/23/2021   Pfizer Covid-19 Vaccine Bivalent Booster 6yrs & up 10/22/2021, 04/07/2022   Pneumococcal Conjugate-13 02/12/2014   Pneumococcal Polysaccharide-23 02/04/2013, 02/12/2014   Td 09/04/2005   Tdap 12/11/2012   Zoster Recombinat (Shingrix) 08/27/2021, 10/27/2021   Zoster, Live 07/07/2006   Pertinent  Health Maintenance Due  Topic Date Due   INFLUENZA VACCINE  Completed      01/30/2019    5:00 PM 04/01/2022    2:39 PM 06/17/2022    9:34 AM 07/11/2022    4:29 PM  Fall Risk  Falls in the past year?  0 0 0  Was there an injury with Fall?  0 0   Fall Risk Category Calculator  0 0   Fall Risk Category (Retired)  Low Low   (RETIRED) Patient Fall Risk Level Low fall risk Low fall risk Low fall risk   Patient at Risk for Falls Due to  No Fall Risks No Fall Risks No Fall Risks  Fall risk Follow up  Falls evaluation completed Falls evaluation completed Falls evaluation completed   Functional Status Survey:    Vitals:   08/30/22 1217  BP: 126/81  Pulse: 68  Resp: 18  Temp: (!) 97.2 F (36.2 C)  SpO2: 95%  Weight: 179 lb 12.8 oz (81.6 kg)  Height: 5\' 7"  (1.702 m)   Body mass index is 28.16 kg/m. Physical Exam  Labs reviewed: Recent Labs    10/22/21 0000 03/10/22 0000  NA 141 137  K 3.8 4.3  CL 102 100  CO2  --  25*  BUN 22* 18  CREATININE 0.8 1.0  CALCIUM  --  9.3   Recent Labs    03/10/22 0000  AST 12*  ALT 3*  ALKPHOS 70  ALBUMIN 3.7   Recent Labs    03/10/22 0000  WBC 8.2  NEUTROABS 5,707.00  HGB 14.6  HCT 42  PLT 219   Lab Results  Component Value Date   TSH 3.18 03/10/2022   No results found for: "HGBA1C" Lab Results  Component Value Date   CHOL 124 10/22/2021   HDL 39 10/22/2021   LDLCALC 70 10/22/2021   TRIG 69 10/22/2021   CHOLHDL 2.9 02/12/2014    Significant Diagnostic Results in  last 30 days:  No results found.  Assessment/Plan There are no diagnoses linked to this encounter.   Family/ staff Communication: ***  Labs/tests ordered:  ***

## 2022-09-03 ENCOUNTER — Non-Acute Institutional Stay (SKILLED_NURSING_FACILITY): Admitting: Family Medicine

## 2022-09-03 DIAGNOSIS — G3184 Mild cognitive impairment, so stated: Secondary | ICD-10-CM

## 2022-09-03 DIAGNOSIS — I10 Essential (primary) hypertension: Secondary | ICD-10-CM | POA: Diagnosis not present

## 2022-09-03 DIAGNOSIS — J9611 Chronic respiratory failure with hypoxia: Secondary | ICD-10-CM

## 2022-09-03 DIAGNOSIS — J849 Interstitial pulmonary disease, unspecified: Secondary | ICD-10-CM

## 2022-09-03 DIAGNOSIS — I5030 Unspecified diastolic (congestive) heart failure: Secondary | ICD-10-CM | POA: Diagnosis not present

## 2022-09-03 DIAGNOSIS — E785 Hyperlipidemia, unspecified: Secondary | ICD-10-CM

## 2022-09-03 DIAGNOSIS — R269 Unspecified abnormalities of gait and mobility: Secondary | ICD-10-CM | POA: Diagnosis not present

## 2022-09-03 NOTE — Progress Notes (Signed)
Provider:  Alain Honey, MD Location:      Place of Service:     PCP: Virgie Dad, MD Patient Care Team: Virgie Dad, MD as PCP - General (Internal Medicine) Leonie Man, MD as PCP - Cardiology (Cardiology)  Extended Emergency Contact Information Primary Emergency Contact: [facility] Address: [address]          [place], [place] [address] [name] of [place] Phone: [phone] Mobile Phone: [phone] Relation: Spouse Secondary Emergency Contact: Janey Genta Address: [address]          [place], [place] Phone: [phone] Mobile Phone: [phone] Relation: Daughter  Code Status:  Goals of Care: Advanced Directive information    08/30/2022   12:26 PM  Advanced Directives  Does Patient Have a Medical Advance Directive? Yes  Type of Paramedic of [place];Out of facility DNR (pink MOST or yellow form);Living will  Does patient want to make changes to medical advance directive? No - Patient declined  Copy of [place] in Chart? Yes - validated most recent copy scanned in chart (See row information)      No chief complaint on file.   HPI: Patient is a 89 y.o. male seen today for medical management of chronic problems:  Past Medical History:  Diagnosis Date  . Anxiety   . CAD S/P percutaneous coronary angioplasty 1996, 2009, 01/2010   PTCA of L Cx 1996; BMS-RCA [facility]) 2009; Staged PCI RCA (70% ISR) --> 3.0 mm x 23 mm BMS, staged PCI Diag - 2.0 mm x 12 mm Mini Vision BMS (2.25 mm)  . Cataracts, bilateral    immature  . Chronic back pain    scoliosis--pt states unable to have surgery bc of age  . GERD (gastroesophageal reflux disease)    takes Nexium daily  . History of colon polyps   . Hyperlipidemia   . Hypertension   . Myocardial infarction [facility]) 1996, 2009   x 2;last time was about 8-36yrs ago   . Obesity (BMI 30.0-34.9)   . OSA (obstructive sleep  apnea)   . Osteoarthritis of both knees   . Peripheral edema    Venous Stasis  . Persistent atrial fibrillation ([facility])    Unable to maintaine NSR after DCCV with Tikosyn.  Plan = rate control w/diltiazem (beta blocker off b/c fatigue). CHA2DS2Vasc 4. DOAC - Eliquis.   . Prostate cancer ([place]) 2005   seed implant   Past Surgical History:  Procedure Laterality Date  . CARDIOVERSION N/A 06/12/2015   Procedure: CARDIOVERSION;  Surgeon: Sanda Klein, MD;  Location: [facility];  Service: Cardiovascular;  Laterality: N/A;  . CARDIOVERSION N/A 08/02/2015   Procedure: CARDIOVERSION;  Surgeon: [name], MD;  Location: [facility];  Service: Cardiovascular;  Laterality: N/A;  . CHOLECYSTECTOMY    . COLONOSCOPY    . CORONARY ANGIOPLASTY WITH STENT PLACEMENT  1996, 2009, August 2011   PTCA of L Cx 1996; BMS-RCA [facility]) 2009; Staged PCI RCA (70% ISR) --> 3.0 mm x 23 mm BMS, staged PCI Diag - 2.0 mm x 12 mm Mini Vision BMS (2.25 mm)  . cyst removed  in college  . NEUROPLASTY / TRANSPOSITION MEDIAN NERVE AT CARPAL TUNNEL BILATERAL    . NM MYOVIEW LTD  November 2013   EF 53%; fixed mid inferior-inferolateral defect, infarct with no ischemia.  . right knee arthrsoscopy  92yrs ago  . right trigger finger    . seeds placed into  prostate   2005  . TEE WITHOUT CARDIOVERSION N/A 06/12/2015   Procedure: TRANSESOPHAGEAL ECHOCARDIOGRAM (TEE) - WITH CARDIOVERSION;  Surgeon: Sanda Klein, MD;  Location: [facility];  Service: Cardiovascular: EF 55-60%.No LA or LAA thrombus. Normal Peal PAP ~33 mmHg.  [name] TOTAL KNEE ARTHROPLASTY  05/09/2012   Procedure: TOTAL KNEE ARTHROPLASTY;  Surgeon: Kerin Salen, MD;  Location: [facility];  Service: Orthopedics;  Laterality: Right;  . TRANSTHORACIC ECHOCARDIOGRAM  06/2015   EF 60-65%. Mild LA dilation. Normal PA pressures: 32 mmHg. Aortic sclerosis but no stenosis.  [name] wisdom teeth extraccted      reports that he quit smoking about 38 years ago. His smoking use included pipe. He has  never used smokeless tobacco. He reports current alcohol use. He reports that he does not use drugs. Social History   Socioeconomic History  . Marital status: Married    Spouse name: Not on file  . Number of children: Not on file  . Years of education: Not on file  . Highest education level: Not on file  Occupational History  . Occupation: Retired  Tobacco Use  . Smoking status: Former    Types: Pipe    Quit date: 03/12/1984    Years since quitting: 38.5  . Smokeless tobacco: Never  . Tobacco comments:    quit smoking pipe about 8yrs ago  Vaping Use  . Vaping Use: Never used  Substance and Sexual Activity  . Alcohol use: Yes    Comment: glass of wine daily  . Drug use: No  . Sexual activity: Not Currently  Other Topics Concern  . Not on file  Social History Narrative   He is a WIDOWED father of 2, grandfather of 2.   [employer] [facility] [employer] [facility] [facility]   He does not smoke. Drinks occasional alcohol.    Social Determinants of Health   Financial Resource Strain: Not on file  Food Insecurity: Not on file  Transportation Needs: Not on file  Physical Activity: Not on file  Stress: Not on file  Social Connections: Not on file  Intimate Partner Violence: Not on file    Functional Status Survey:    Family History  Problem Relation Age of Onset  . Cancer Mother 33       abdomen died in 63s  . Heart attack Father 90       died in 28s    Health Maintenance  Topic Date Due  . COVID-19 Vaccine (9 - 2023-24 season) 09/05/2022 (Originally 06/02/2022)  . DTaP/Tdap/Td (3 - Td or Tdap) 12/12/2022  . Pneumonia Vaccine 10+ Years old  Completed  . INFLUENZA VACCINE  Completed  . Zoster Vaccines- Shingrix  Completed  . HPV VACCINES  Aged Out    Allergies  Allergen Reactions  . Crab [Shellfish Allergy] Rash  . Codeine   . Hydrocodone Hives  . Adhesive [Tape] Rash  . Oxycodone Rash    Outpatient Encounter Medications as of 09/03/2022  Medication  Sig  . acetaminophen (TYLENOL) 325 MG tablet Take 650 mg by mouth every 6 (six) hours as needed.  . diltiazem (DILACOR XR) 240 MG 24 hr capsule Take 240 mg by mouth daily. For heart failure  . ELIQUIS 5 MG TABS tablet TAKE 1 TABLET TWICE A DAY (SCHEDULE FOLLOW UP WITH CARDIOLOGIST PRIOR TO NEXT REFILL REQUEST)  . escitalopram (LEXAPRO) 10 MG tablet Take 10 mg by mouth daily.  [name] esomeprazole (NEXIUM) 40 MG capsule TAKE 1 CAPSULE DAILY  .  furosemide (LASIX) 40 MG tablet Take 40 mg by mouth daily.  [name] lactose free nutrition (BOOST) LIQD Take 237 mLs by mouth 2 (two) times daily between meals.  . mirabegron ER (MYRBETRIQ) 25 MG TB24 tablet Take 25 mg by mouth daily.   . nitroGLYCERIN (NITROSTAT) 0.4 MG SL tablet Place 0.4 mg under the tongue as needed for chest pain.  . OXYGEN 2lpm  . Polyethylene Glycol 3350 (MIRALAX PO) Take 17 g by mouth daily.  . Sodium Fluoride (PREVIDENT 5000 BOOSTER PLUS) 1.1 % PSTE Place 1 application onto teeth at bedtime.  . tamsulosin (FLOMAX) 0.4 MG CAPS capsule Take 0.4 mg by mouth daily.    No facility-administered encounter medications on file as of 09/03/2022.    Review of Systems  There were no vitals filed for this visit. There is no height or weight on file to calculate BMI. Physical Exam  Labs reviewed: Basic Metabolic Panel: Recent Labs    10/22/21 0000 03/10/22 0000  NA 141 137  K 3.8 4.3  CL 102 100  CO2  --  25*  BUN 22* 18  CREATININE 0.8 1.0  CALCIUM  --  9.3   Liver Function Tests: Recent Labs    03/10/22 0000  AST 12*  ALT 3*  ALKPHOS 70  ALBUMIN 3.7   No results for input(s): "LIPASE", "AMYLASE" in the last 8760 hours. No results for input(s): "AMMONIA" in the last 8760 hours. CBC: Recent Labs    03/10/22 0000  WBC 8.2  NEUTROABS 5,707.00  HGB 14.6  HCT 42  PLT 219   Cardiac Enzymes: No results for input(s): "CKTOTAL", "CKMB", "CKMBINDEX", "TROPONINI" in the last 8760 hours. BNP: Invalid input(s): "POCBNP" No  results found for: "HGBA1C" Lab Results  Component Value Date   TSH 3.18 03/10/2022   Lab Results  Component Value Date   VITAMINB12 335 04/03/2019   Lab Results  Component Value Date   FOLATE 13.7 04/03/2019   Lab Results  Component Value Date   IRON 30 04/03/2019   FERRITIN 32 04/03/2019    Imaging and Procedures obtained prior to SNF admission: CT Chest W Contrast  Result Date: 03/27/2019 CLINICAL DATA:  Follow-up lung nodule. EXAM: CT CHEST WITH CONTRAST TECHNIQUE: Multidetector CT imaging of the chest was performed during intravenous contrast administration. CONTRAST:  25mL OMNIPAQUE IOHEXOL 300 MG/ML  SOLN COMPARISON:  11/23/2018 FINDINGS: Cardiovascular: Normal heart size. No pericardial effusion. 4 cm right ventricular outflow track is identified. Findings may reflect PA hypertension. Aortic atherosclerosis. Lad, left circumflex and RCA coronary artery calcifications. Mediastinum/Nodes: Normal appearance of the thyroid gland. The trachea appears patent and is midline. Normal appearance of the esophagus. Prominent mediastinal lymph nodes are identified including 1.6 cm right paratracheal lymph node, image 52/2. Previously this measured the same. 1.5 cm subcarinal lymph node is identified, image 72/2. Previously 1.3 cm. Enlarged right hilar node measures 2 cm, image 64/2. This is difficult to compare with the previous exam as the prior study was performed without IV contrast material. Lungs/Pleura: No pleural effusion identified. Diffuse bilateral interstitial reticulation is identified which has a lower lung zone predominance. Mild bronchiectasis. A mosaic attenuation pattern is noted bilaterally. Right middle lobe lung nodule is unchanged measuring 4 mm, image 93/3. right upper lobe lung nodule measuring 4 mm is stable from previous exam, image 46/3. Upper Abdomen: Unchanged 1.5 cm low-density left adrenal nodule. Previous cholecystectomy. Left upper pole kidney cysts noted.  Musculoskeletal: Spondylosis within the thoracic spine. Unchanged lower thoracic spine compression  fracture IMPRESSION: 1. Unchanged right upper lobe and right middle lobe lung nodules. No new or enlarging pulmonary nodules identified. No follow-up needed if patient is low-risk (and has no known or suspected primary neoplasm). Non-contrast chest CT can be considered at 12 months (from 11/23/2018) if patient is high-risk. This recommendation follows the consensus statement: Guidelines for Management of Incidental Pulmonary Nodules Detected on CT Images: From the Fleischner Society 2017; Radiology 2017; 284:228-243. 2. Chronic interstitial lung disease with lower lung zone predominance and mild bronchiectasis. As mentioned previously findings may reflect nonspecific interstitial pneumonitis or early usual interstitial pneumonitis. 3. Multi vessel coronary artery calcifications noted. 4. Stable enlarged mediastinal and right hilar lymph nodes. 5. Stable left adrenal nodule. 6. Stable lower thoracic spine compression fracture. Enlargement of the right ventricular outflow tract may reflect underlying PA hypertension. 7. Unchanged lower thoracic spine compression fracture. Aortic Atherosclerosis (ICD10-I70.0). Electronically Signed   By: Kerby Moors M.D.   On: 03/27/2019 15:45    Assessment/Plan There are no diagnoses linked to this encounter.   Family/ staff Communication:   Labs/tests ordered:  .smmsig

## 2022-09-06 NOTE — Addendum Note (Signed)
Addended by: Wardell Honour on: 09/06/2022 11:02 AM   Modules accepted: Level of Service

## 2022-09-12 ENCOUNTER — Encounter: Payer: Self-pay | Admitting: Nurse Practitioner

## 2022-09-12 ENCOUNTER — Non-Acute Institutional Stay (SKILLED_NURSING_FACILITY): Payer: Medicare Other | Admitting: Nurse Practitioner

## 2022-09-12 DIAGNOSIS — G3184 Mild cognitive impairment, so stated: Secondary | ICD-10-CM

## 2022-09-12 DIAGNOSIS — E785 Hyperlipidemia, unspecified: Secondary | ICD-10-CM

## 2022-09-12 DIAGNOSIS — I10 Essential (primary) hypertension: Secondary | ICD-10-CM

## 2022-09-12 DIAGNOSIS — K5900 Constipation, unspecified: Secondary | ICD-10-CM

## 2022-09-12 DIAGNOSIS — F419 Anxiety disorder, unspecified: Secondary | ICD-10-CM

## 2022-09-12 DIAGNOSIS — I5030 Unspecified diastolic (congestive) heart failure: Secondary | ICD-10-CM

## 2022-09-12 DIAGNOSIS — D509 Iron deficiency anemia, unspecified: Secondary | ICD-10-CM | POA: Diagnosis not present

## 2022-09-12 DIAGNOSIS — K219 Gastro-esophageal reflux disease without esophagitis: Secondary | ICD-10-CM

## 2022-09-12 DIAGNOSIS — J849 Interstitial pulmonary disease, unspecified: Secondary | ICD-10-CM

## 2022-09-12 DIAGNOSIS — I4819 Other persistent atrial fibrillation: Secondary | ICD-10-CM

## 2022-09-12 DIAGNOSIS — N3942 Incontinence without sensory awareness: Secondary | ICD-10-CM

## 2022-09-12 NOTE — Assessment & Plan Note (Signed)
takes Atorvastatin, LDL 70 10/22/21 

## 2022-09-12 NOTE — Assessment & Plan Note (Signed)
stable, on MiraLax  ?

## 2022-09-12 NOTE — Assessment & Plan Note (Signed)
Hx of Interstitial lung disease, O2 dependent, no further f/u Pulmonology 

## 2022-09-12 NOTE — Assessment & Plan Note (Signed)
heart rate is in control, on Diltiazem, Eliquis, TSH 3.18 03/10/22 

## 2022-09-12 NOTE — Assessment & Plan Note (Signed)
CHF/no edema BLE, on Furosamide. Bun/creat 18/0.96 03/10/22

## 2022-09-12 NOTE — Assessment & Plan Note (Signed)
Blood pressure is controlled, on Diltiazem, Furosemide, Bun/creat 18/0.96 03/10/22

## 2022-09-12 NOTE — Assessment & Plan Note (Signed)
His mood is  stable, on Lexapro.

## 2022-09-12 NOTE — Assessment & Plan Note (Signed)
Stable, stable, on Esomeprazole

## 2022-09-12 NOTE — Progress Notes (Signed)
Location:   S[facility] Room Number: 5 Place of Service:  SNF (31) Provider: [facility] Tejah Brekke NP  Mahlon Gammon, MD  Patient Care Team: Mahlon Gammon, MD as PCP - General (Internal Medicine) Marykay Lex, MD as PCP - Cardiology (Cardiology)  Extended Emergency Contact Information Primary Emergency Contact: [facility] Address: [address]          [place], [place] [address] Darden Amber of [place] Home Phone: [phone] Mobile Phone: [phone] Relation: Spouse Secondary Emergency Contact: Doneta Public Address: [address]          [place], [place] Home Phone: [phone] Mobile Phone: [phone] Relation: Daughter  Code Status:  DNR Goals of care: Advanced Directive information    08/30/2022   12:26 PM  Advanced Directives  Does Patient Have a Medical Advance Directive? Yes  Type of Estate agent of [place];Out of facility DNR (pink MOST or yellow form);Living will  Does patient want to make changes to medical advance directive? No - Patient declined  Copy of Healthcare Power of Attorney in Chart? Yes - validated most recent copy scanned in chart (See row information)     Chief Complaint  Patient presents with   Medical Management of Chronic Issues    HPI:  Pt is a 89 y.o. male seen today for medical management of chronic diseases.   Gait abnormality, mechanical lift for transfer, w/c for mobility             Mild cognitive impairment, SNF [facility] for supportive care.  Urinary incontinent, no urinary retention, on Mirabegron, Tamsulosin              Constipation, stable, on MiraLax             OA, uses mechanical lift for transfer, takes prn Tylenol.             Hx of Interstitial lung disease, O2 dependent, no further f/u Pulmonology             CHF/no edema BLE, on Furosamide. Bun/creat 18/0.96 03/10/22 Anxiety/depression, stable, on Lexapro.              Afib, heart rate is in control, on  Diltiazem, Eliquis, TSH 3.18 03/10/22             GERD, stable, on Esomeprazole             Anemia, stable, off Fe. Hgb 14.6 03/10/22             HTN,  on Diltiazem, Furosemide, Bun/creat 18/0.96 03/10/22             OSA refused CPAP             Hyperlipidemia, takes Atorvastatin, LDL 70 10/22/21   Past Medical History:  Diagnosis Date   Anxiety    CAD S/P percutaneous coronary angioplasty 1996, 2009, 01/2010   PTCA of L Cx 1996; BMS-RCA [facility]) 2009; Staged PCI RCA (70% ISR) --> 3.0 mm x 23 mm BMS, staged PCI Diag - 2.0 mm x 12 mm Mini Vision BMS (2.25 mm)   Cataracts, bilateral    immature   Chronic back pain    scoliosis--pt states unable to have surgery bc of age   GERD (gastroesophageal reflux disease)    takes Nexium daily   History of colon polyps    Hyperlipidemia    Hypertension    Myocardial infarction 1996, 2009   x 2;last time was about  8-273yrs ago    Obesity (BMI 30.0-34.9)    OSA (obstructive sleep apnea)    Osteoarthritis of both knees    Peripheral edema    Venous Stasis   Persistent atrial fibrillation    Unable to maintaine NSR after DCCV with Tikosyn.  Plan = rate control w/diltiazem (beta blocker off b/c fatigue). CHA2DS2Vasc 4. DOAC - Eliquis.    Prostate cancer 2005   seed implant   Past Surgical History:  Procedure Laterality Date   CARDIOVERSION N/A 06/12/2015   Procedure: CARDIOVERSION;  Surgeon: Thurmon FairMihai Croitoru, MD;  [facility];  Service: Cardiovascular;  Laterality: N/A;   CARDIOVERSION N/A 08/02/2015   Procedure: CARDIOVERSION;  Surgeon: Hillis RangeJames Allred, MD;  [facility];  Service: Cardiovascular;  Laterality: N/A;   CHOLECYSTECTOMY     COLONOSCOPY     CORONARY ANGIOPLASTY WITH STENT PLACEMENT  1996, 2009, August 2011   PTCA of L Cx 1996; BMS-RCA [facility] 2009; Staged PCI RCA (70% ISR) --> 3.0 mm x 23 mm BMS, staged PCI Diag - 2.0 mm x 12 mm Mini Vision BMS (2.25 mm)   cyst removed  in college   NEUROPLASTY / TRANSPOSITION MEDIAN NERVE AT  CARPAL TUNNEL BILATERAL     NM MYOVIEW LTD  November 2013   EF 53%; fixed mid inferior-inferolateral defect, infarct with no ischemia.   right knee arthrsoscopy  7196yrs ago   right trigger finger     seeds placed into prostate   2005   TEE WITHOUT CARDIOVERSION N/A 06/12/2015   Procedure: TRANSESOPHAGEAL ECHOCARDIOGRAM (TEE) - WITH CARDIOVERSION;  Surgeon: Thurmon FairMihai Croitoru, MD;  [facility];  Service: Cardiovascular: EF 55-60%.No LA or LAA thrombus. Normal Peal PAP ~33 mmHg.   TOTAL KNEE ARTHROPLASTY  05/09/2012   Procedure: TOTAL KNEE ARTHROPLASTY;  Surgeon: Nestor LewandowskyFrank J Rowan, MD;  [facility]: [facility] OR;  Service: Orthopedics;  Laterality: Right;   TRANSTHORACIC ECHOCARDIOGRAM  06/2015   EF 60-65%. Mild LA dilation. Normal PA pressures: 32 mmHg. Aortic sclerosis but no stenosis.   wisdom teeth extraccted      Allergies  Allergen Reactions   Crab [Shellfish Allergy] Rash   Codeine    Hydrocodone Hives   Adhesive [Tape] Rash   Oxycodone Rash    Allergies as of 09/12/2022       Reactions   Crab [shellfish Allergy] Rash   Codeine    Hydrocodone Hives   Adhesive [tape] Rash   Oxycodone Rash        Medication List        Accurate as of September 12, 2022  2:45 PM. If you have any questions, ask your nurse or doctor.          acetaminophen 325 MG tablet Commonly known as: TYLENOL Take 650 mg by mouth every 6 (six) hours as needed.   diltiazem 240 MG 24 hr capsule Commonly known as: DILACOR XR Take 240 mg by mouth daily. For heart failure   Eliquis 5 MG Tabs tablet Generic drug: apixaban TAKE 1 TABLET TWICE A DAY (SCHEDULE FOLLOW UP WITH CARDIOLOGIST PRIOR TO NEXT REFILL REQUEST)   escitalopram 10 MG tablet Commonly known as: LEXAPRO Take 10 mg by mouth daily.   esomeprazole 40 MG capsule Commonly known as: NEXIUM TAKE 1 CAPSULE DAILY   furosemide 40 MG tablet Commonly known as: LASIX Take 40 mg by mouth daily.   lactose free nutrition Liqd Take 237 mLs by mouth 2  (two) times daily between meals.   mirabegron ER 25 MG  Tb24 tablet Commonly known as: MYRBETRIQ Take 25 mg by mouth daily.   MIRALAX PO Take 17 g by mouth daily.   nitroGLYCERIN 0.4 MG SL tablet Commonly known as: NITROSTAT Place 0.4 mg under the tongue as needed for chest pain.   OXYGEN 2lpm   PreviDent 5000 Booster Plus 1.1 % Pste Generic drug: Sodium Fluoride Place 1 application onto teeth at bedtime.   tamsulosin 0.4 MG Caps capsule Commonly known as: FLOMAX Take 0.4 mg by mouth daily.        Review of Systems  Constitutional:  Negative for appetite change, fatigue and fever.  HENT:  Positive for hearing loss. Negative for congestion and trouble swallowing.   Eyes:  Negative for visual disturbance.  Respiratory:  Positive for shortness of breath. Negative for cough.        O2 dependent. DOE  Cardiovascular:  Negative for leg swelling.  Gastrointestinal:  Negative for abdominal pain and constipation.       Incontinent of feces.   Genitourinary:  Positive for frequency. Negative for dysuria and urgency.       Incontinent of urine.   Musculoskeletal:  Positive for arthralgias and gait problem.       Left knee pain is chronic.   Skin:  Negative for color change.  Neurological:  Negative for speech difficulty, weakness and headaches.       Memory lapses.   Psychiatric/Behavioral:  Negative for behavioral problems and sleep disturbance. The patient is nervous/anxious.        Anxious at times.     Immunization History  Administered Date(s) Administered   Diphtheria 12/11/2012   H1N1 04/13/2010   Influenza, High Dose Seasonal PF 02/12/2014, 03/29/2018, 01/05/2019, 04/04/2022   Influenza-Unspecified 03/06/2013, 02/12/2014, 03/06/2017, 03/17/2020, 03/24/2021   Moderna SARS-COV2 Booster Vaccination 11/03/2020, 10/22/2021   Moderna Sars-Covid-2 Vaccination 06/08/2019, 07/06/2019, 04/14/2020   PFIZER(Purple Top)SARS-COV-2 Vaccination 02/23/2021   Pfizer Covid-19  Vaccine Bivalent Booster 2yrs & up 10/22/2021, 04/07/2022   Pneumococcal Conjugate-13 02/12/2014   Pneumococcal Polysaccharide-23 02/04/2013, 02/12/2014   Td 09/04/2005   Tdap 12/11/2012   Zoster Recombinat (Shingrix) 08/27/2021, 10/27/2021   Zoster, Live 07/07/2006   Pertinent  Health Maintenance Due  Topic Date Due   INFLUENZA VACCINE  01/05/2023      01/30/2019    5:00 PM 04/01/2022    2:39 PM 06/17/2022    9:34 AM 07/11/2022    4:29 PM  Fall Risk  Falls in the past year?  0 0 0  Was there an injury with Fall?  0 0   Fall Risk Category Calculator  0 0   Fall Risk Category (Retired)  Low Low   (RETIRED) Patient Fall Risk Level Low fall risk Low fall risk Low fall risk   Patient at Risk for Falls Due to  No Fall Risks No Fall Risks No Fall Risks  Fall risk Follow up  Falls evaluation completed Falls evaluation completed Falls evaluation completed   Functional Status Survey:    Vitals:   09/12/22 1435  BP: 110/72  Pulse: 62  Resp: 17  Temp: (!) 97 F (36.1 C)  SpO2: 93%  Weight: 175 lb 1.6 oz (79.4 kg)   Body mass index is 27.42 kg/m. Physical Exam Vitals and nursing note reviewed.  Constitutional:      Appearance: Normal appearance.  HENT:     Head: Normocephalic and atraumatic.  Eyes:     Extraocular Movements: Extraocular movements intact.     Conjunctiva/sclera: Conjunctivae normal.  Pupils: Pupils are equal, round, and reactive to light.  Cardiovascular:     Rate and Rhythm: Normal rate. Rhythm irregular.     Heart sounds: No murmur heard. Pulmonary:     Breath sounds: No wheezing.     Comments: O2 dependent.  Abdominal:     General: Bowel sounds are normal.     Palpations: Abdomen is soft.     Tenderness: There is no abdominal tenderness.  Musculoskeletal:     Cervical back: Normal range of motion and neck supple.     Right lower leg: No edema.     Left lower leg: No edema.     Comments: Mechanical lift for transfer.   Skin:    General: Skin  is warm and dry.  Neurological:     General: No focal deficit present.     Mental Status: He is alert and oriented to person, place, and time. Mental status is at baseline.     Gait: Gait abnormal.  Psychiatric:        Mood and Affect: Mood normal.        Behavior: Behavior normal.        Thought Content: Thought content normal.     Labs reviewed: Recent Labs    10/22/21 0000 03/10/22 0000  NA 141 137  K 3.8 4.3  CL 102 100  CO2  --  25*  BUN 22* 18  CREATININE 0.8 1.0  CALCIUM  --  9.3   Recent Labs    03/10/22 0000  AST 12*  ALT 3*  ALKPHOS 70  ALBUMIN 3.7   Recent Labs    03/10/22 0000  WBC 8.2  NEUTROABS 5,707.00  HGB 14.6  HCT 42  PLT 219   Lab Results  Component Value Date   TSH 3.18 03/10/2022   No results found for: "HGBA1C" Lab Results  Component Value Date   CHOL 124 10/22/2021   HDL 39 10/22/2021   LDLCALC 70 10/22/2021   TRIG 69 10/22/2021   CHOLHDL 2.9 02/12/2014    Significant Diagnostic Results in last 30 days:  No results found.  Assessment/Plan  Essential hypertension Blood pressure is controlled, on Diltiazem, Furosemide, Bun/creat 18/0.96 03/10/22  Hyperlipidemia with target LDL less than 70  takes Atorvastatin, LDL 70 10/22/21  Iron deficiency anemia stable, off Fe. Hgb 14.6 03/10/22  GERD (gastroesophageal reflux disease) Stable, stable, on Esomeprazole  Persistent atrial fibrillation (HCC):  CHA2DS2-VASc Score 4; On Eliquis heart rate is in control, on Diltiazem, Eliquis, TSH 3.18 03/10/22  Anxiety His mood is  stable, on Lexapro.   Diastolic CHF (HCC) CHF/no edema BLE, on Furosamide. Bun/creat 18/0.96 03/10/22  ILD (interstitial lung disease) (HCC) Hx of Interstitial lung disease, O2 dependent, no further f/u Pulmonology  Constipation stable, on MiraLax  Urinary incontinence  no urinary retention, on Mirabegron, Tamsulosin   MCI (mild cognitive impairment) with memory loss  SNF [name] for supportive care.     Family/ staff Communication: plan of care reviewed with the patient and charge nurse.   Labs/tests ordered:  none  Time spend 35 minutes.

## 2022-09-12 NOTE — Assessment & Plan Note (Signed)
SNF [facility] for supportive care.  

## 2022-09-12 NOTE — Assessment & Plan Note (Signed)
no urinary retention, on Mirabegron, Tamsulosin  

## 2022-09-12 NOTE — Assessment & Plan Note (Signed)
stable, off Fe. Hgb 14.6 03/10/22 

## 2022-10-07 ENCOUNTER — Encounter: Payer: Self-pay | Admitting: Nurse Practitioner

## 2022-10-07 ENCOUNTER — Non-Acute Institutional Stay (SKILLED_NURSING_FACILITY): Payer: Medicare Other | Admitting: Nurse Practitioner

## 2022-10-07 DIAGNOSIS — G3184 Mild cognitive impairment, so stated: Secondary | ICD-10-CM

## 2022-10-07 DIAGNOSIS — N3942 Incontinence without sensory awareness: Secondary | ICD-10-CM

## 2022-10-07 DIAGNOSIS — I4819 Other persistent atrial fibrillation: Secondary | ICD-10-CM

## 2022-10-07 DIAGNOSIS — D509 Iron deficiency anemia, unspecified: Secondary | ICD-10-CM | POA: Diagnosis not present

## 2022-10-07 DIAGNOSIS — K219 Gastro-esophageal reflux disease without esophagitis: Secondary | ICD-10-CM | POA: Diagnosis not present

## 2022-10-07 DIAGNOSIS — I10 Essential (primary) hypertension: Secondary | ICD-10-CM

## 2022-10-07 DIAGNOSIS — I5030 Unspecified diastolic (congestive) heart failure: Secondary | ICD-10-CM

## 2022-10-07 DIAGNOSIS — J849 Interstitial pulmonary disease, unspecified: Secondary | ICD-10-CM

## 2022-10-07 DIAGNOSIS — K5900 Constipation, unspecified: Secondary | ICD-10-CM

## 2022-10-07 DIAGNOSIS — F419 Anxiety disorder, unspecified: Secondary | ICD-10-CM

## 2022-10-07 DIAGNOSIS — M1711 Unilateral primary osteoarthritis, right knee: Secondary | ICD-10-CM

## 2022-10-07 NOTE — Progress Notes (Signed)
Location:  [facility] Room Number: 5-A Place of Service:  SNF [phone]) Provider:  Clementina Mareno Dicie Beam    Patient Care Team: Mahlon Gammon, MD as PCP - General (Internal Medicine) Marykay Lex, MD as PCP - Cardiology (Cardiology)  Extended Emergency Contact Information Primary Emergency Contact: [facility] Address: [address]          [place], [place] [address] Darden Amber of [place] Home Phone: [phone] Mobile Phone: [phone] Relation: Spouse Secondary Emergency Contact: Doneta Public Address: [address]          [place], [place] Home Phone: [phone] Mobile Phone: [phone] Relation: Daughter  Code Status:  DNR Goals of care: Advanced Directive information    10/07/2022   10:15 AM  Advanced Directives  Does Patient Have a Medical Advance Directive? Yes  Type of Estate agent of [place];Living will;Out of facility DNR (pink MOST or yellow form)  Does patient want to make changes to medical advance directive? No - Patient declined  Copy of Healthcare Power of Attorney in Chart? Yes - validated most recent copy scanned in chart (See row information)  Pre-existing out of facility DNR order (yellow form or pink MOST form) Pink MOST/Yellow Form most recent copy in chart - Physician notified to receive inpatient order     Chief Complaint  Patient presents with  . Routine    HPI:  Pt is a 89 y.o. male seen today for medical management of chronic diseases.      Gait abnormality, mechanical lift for transfer, w/c for mobility             Mild cognitive impairment, SNF [facility] for supportive care.  Urinary incontinent, no urinary retention, on Mirabegron, Tamsulosin              Constipation, stable, on MiraLax             OA, uses mechanical lift for transfer, takes prn Tylenol.             Hx of Interstitial lung disease, O2 dependent, no further f/u Pulmonology             CHF/no  edema BLE, on Furosamide. Bun/creat 18/0.96 03/10/22 Anxiety/depression, stable, on Lexapro.              Afib, heart rate is in control, on Diltiazem, Eliquis, TSH 3.18 03/10/22             GERD, stable, on Esomeprazole             Anemia, stable, off Fe. Hgb 14.6 03/10/22             HTN,  on Diltiazem, Furosemide, Bun/creat 18/0.96 03/10/22             OSA refused CPAP             Hyperlipidemia, takes Atorvastatin, LDL 70 10/22/21               Past Medical History:  Diagnosis Date  . Anxiety   . CAD S/P percutaneous coronary angioplasty 1996, 2009, 01/2010   PTCA of L Cx 1996; BMS-RCA [facility]) 2009; Staged PCI RCA (70% ISR) --> 3.0 mm x 23 mm BMS, staged PCI Diag - 2.0 mm x 12 mm Mini Vision BMS (2.25 mm)  . Cataracts, bilateral    immature  . Chronic back pain    scoliosis--pt states unable to have surgery bc of age  . GERD (  gastroesophageal reflux disease)    takes Nexium daily  . History of colon polyps   . Hyperlipidemia   . Hypertension   . Myocardial infarction [facility]) 1996, 2009   x 2;last time was about 8-47yrs ago   . Obesity (BMI 30.0-34.9)   . OSA (obstructive sleep apnea)   . Osteoarthritis of both knees   . Peripheral edema    Venous Stasis  . Persistent atrial fibrillation (HCC)    Unable to maintaine NSR after DCCV with Tikosyn.  Plan = rate control w/diltiazem (beta blocker off b/c fatigue). CHA2DS2Vasc 4. DOAC - Eliquis.   . Prostate cancer (HCC) 2005   seed implant   Past Surgical History:  Procedure Laterality Date  . CARDIOVERSION N/A 06/12/2015   Procedure: CARDIOVERSION;  Surgeon: Thurmon Fair, MD;  [facility];  Service: Cardiovascular;  Laterality: N/A;  . CARDIOVERSION N/A 08/02/2015   Procedure: CARDIOVERSION;  Surgeon: Hillis Range, MD;  Location: [facility] OR;  Service: Cardiovascular;  Laterality: N/A;  . CHOLECYSTECTOMY    . COLONOSCOPY    . CORONARY ANGIOPLASTY WITH STENT PLACEMENT  1996, 2009, August 2011   PTCA of L Cx 1996; BMS-RCA [facility])  2009; Staged PCI RCA (70% ISR) --> 3.0 mm x 23 mm BMS, staged PCI Diag - 2.0 mm x 12 mm Mini Vision BMS (2.25 mm)  . cyst removed  in college  . NEUROPLASTY / TRANSPOSITION MEDIAN NERVE AT CARPAL TUNNEL BILATERAL    . NM MYOVIEW LTD  November 2013   EF 53%; fixed mid inferior-inferolateral defect, infarct with no ischemia.  . right knee arthrsoscopy  81yrs ago  . right trigger finger    . seeds placed into prostate   2005  . TEE WITHOUT CARDIOVERSION N/A 06/12/2015   Procedure: TRANSESOPHAGEAL ECHOCARDIOGRAM (TEE) - WITH CARDIOVERSION;  Surgeon: Thurmon Fair, MD;  [facility];  Service: Cardiovascular: EF 55-60%.No LA or LAA thrombus. Normal Peal PAP ~33 mmHg.  [name] TOTAL KNEE ARTHROPLASTY  05/09/2012   Procedure: TOTAL KNEE ARTHROPLASTY;  Surgeon: Nestor Lewandowsky, MD;  Location: [facility] OR;  Service: Orthopedics;  Laterality: Right;  . TRANSTHORACIC ECHOCARDIOGRAM  06/2015   EF 60-65%. Mild LA dilation. Normal PA pressures: 32 mmHg. Aortic sclerosis but no stenosis.  [name] wisdom teeth extraccted      Allergies  Allergen Reactions  . Crab [Shellfish Allergy] Rash  . Codeine   . Hydrocodone Hives  . Adhesive [Tape] Rash  . Oxycodone Rash    Outpatient Encounter Medications as of 10/07/2022  Medication Sig  . acetaminophen (TYLENOL) 325 MG tablet Take 650 mg by mouth every 6 (six) hours as needed.  . diltiazem (DILACOR XR) 240 MG 24 hr capsule Take 240 mg by mouth daily. For heart failure  . ELIQUIS 5 MG TABS tablet TAKE 1 TABLET TWICE A DAY (SCHEDULE FOLLOW UP WITH CARDIOLOGIST PRIOR TO NEXT REFILL REQUEST)  . escitalopram (LEXAPRO) 10 MG tablet Take 10 mg by mouth daily.  [name] esomeprazole (NEXIUM) 40 MG capsule TAKE 1 CAPSULE DAILY  . furosemide (LASIX) 40 MG tablet Take 40 mg by mouth daily.  [name] lactose free nutrition (BOOST) LIQD Take 237 mLs by mouth 2 (two) times daily between meals.  . mirabegron ER (MYRBETRIQ) 25 MG TB24 tablet Take 25 mg by mouth daily.   . nitroGLYCERIN  (NITROSTAT) 0.4 MG SL tablet Place 0.4 mg under the tongue as needed for chest pain.  . OXYGEN 2lpm  . Polyethylene Glycol 3350 (MIRALAX PO) Take 17 g by  mouth daily.  . Sodium Fluoride (PREVIDENT 5000 BOOSTER PLUS) 1.1 % PSTE Place 1 application onto teeth at bedtime.  . tamsulosin (FLOMAX) 0.4 MG CAPS capsule Take 0.4 mg by mouth daily.    No facility-administered encounter medications on file as of 10/07/2022.    Review of Systems  Immunization History  Administered Date(s) Administered  . Diphtheria 12/11/2012  . H1N1 04/13/2010  . Influenza, High Dose Seasonal PF 02/12/2014, 03/29/2018, 01/05/2019, 04/04/2022  . Influenza-Unspecified 03/06/2013, 02/12/2014, 03/06/2017, 03/17/2020, 03/24/2021  . Moderna SARS-COV2 Booster Vaccination 11/03/2020, 10/22/2021  . Moderna Sars-Covid-2 Vaccination 06/08/2019, 07/06/2019, 04/14/2020  . PFIZER(Purple Top)SARS-COV-2 Vaccination 02/23/2021  . Research officer, trade [employer] 28yrs & up 10/22/2021, 04/07/2022  . Pneumococcal Conjugate-13 02/12/2014  . Pneumococcal Polysaccharide-23 02/04/2013, 02/12/2014  . Td 09/04/2005  . Tdap 12/11/2012  . Zoster Recombinat (Shingrix) 08/27/2021, 10/27/2021  . Zoster, Live 07/07/2006   Pertinent  Health Maintenance Due  Topic Date Due  . INFLUENZA VACCINE  01/05/2023      01/30/2019    5:00 PM 04/01/2022    2:39 PM 06/17/2022    9:34 AM 07/11/2022    4:29 PM  Fall Risk  Falls in the past year?  0 0 0  Was there an injury with Fall?  0 0   Fall Risk Category Calculator  0 0   Fall Risk Category (Retired)  Low Low   (RETIRED) Patient Fall Risk Level Low fall risk Low fall risk Low fall risk   Patient at Risk for Falls Due to  No Fall Risks No Fall Risks No Fall Risks  Fall risk Follow up  Falls evaluation completed Falls evaluation completed Falls evaluation completed   Functional Status Survey:    Vitals:   10/07/22 0933  BP: 112/88  Pulse: 90  Resp: 17  Temp: 97.7 F (36.5 C)   SpO2: 96%  Weight: 180 lb (81.6 kg)  Height: 5\' 7"  (1.702 m)   Body mass index is 28.19 kg/m. Physical Exam  Labs reviewed: Recent Labs    10/22/21 0000 03/10/22 0000  NA 141 137  K 3.8 4.3  CL 102 100  CO2  --  25*  BUN 22* 18  CREATININE 0.8 1.0  CALCIUM  --  9.3   Recent Labs    03/10/22 0000  AST 12*  ALT 3*  ALKPHOS 70  ALBUMIN 3.7   Recent Labs    03/10/22 0000  WBC 8.2  NEUTROABS 5,707.00  HGB 14.6  HCT 42  PLT 219   Lab Results  Component Value Date   TSH 3.18 03/10/2022   No results found for: "HGBA1C" Lab Results  Component Value Date   CHOL 124 10/22/2021   HDL 39 10/22/2021   LDLCALC 70 10/22/2021   TRIG 69 10/22/2021   CHOLHDL 2.9 02/12/2014    Significant Diagnostic Results in last 30 days:  No results found.  Assessment/Plan No problem-specific Assessment & Plan notes found for this encounter.     Family/ staff Communication: plan of care reviewed with the patient and charge nurse.   Labs/tests ordered:  none  Time spend 35 minutes.

## 2022-10-10 NOTE — Assessment & Plan Note (Signed)
no urinary retention, on Mirabegron, Tamsulosin  

## 2022-10-10 NOTE — Assessment & Plan Note (Signed)
heart rate is in control, on Diltiazem, Eliquis, TSH 3.18 03/10/22 

## 2022-10-10 NOTE — Assessment & Plan Note (Signed)
stable, on MiraLax  ?

## 2022-10-10 NOTE — Assessment & Plan Note (Signed)
stable, off Fe. Hgb 14.6 03/10/22 

## 2022-10-10 NOTE — Assessment & Plan Note (Signed)
no edema BLE, on Furosamide. Bun/creat 18/0.96 03/10/22

## 2022-10-10 NOTE — Assessment & Plan Note (Signed)
Hx of Interstitial lung disease, O2 dependent, no further f/u Pulmonology 

## 2022-10-10 NOTE — Assessment & Plan Note (Signed)
Mild cognitive impairment, SNF [facility] for supportive care.  

## 2022-10-10 NOTE — Assessment & Plan Note (Signed)
stable, on Esomeprazole °

## 2022-10-10 NOTE — Assessment & Plan Note (Signed)
uses mechanical lift for transfer, takes prn Tylenol.   

## 2022-10-10 NOTE — Assessment & Plan Note (Signed)
stable, on Lexapro.  

## 2022-10-10 NOTE — Assessment & Plan Note (Signed)
Blood pressure is controlled, on Diltiazem, Furosemide, Bun/creat 18/0.96 03/10/22 

## 2022-11-29 LAB — HEPATIC FUNCTION PANEL
ALT: 5 U/L — AB (ref 10–40)
AST: 12 — AB (ref 14–40)
Alkaline Phosphatase: 68 (ref 25–125)
Bilirubin, Total: 0.7

## 2022-11-29 LAB — BASIC METABOLIC PANEL
BUN: 21 (ref 4–21)
CO2: 28 — AB (ref 13–22)
Chloride: 104 (ref 99–108)
Creatinine: 0.9 (ref 0.6–1.3)
Glucose: 85
Potassium: 4.3 mEq/L (ref 3.5–5.1)
Sodium: 142 (ref 137–147)

## 2022-11-29 LAB — CBC AND DIFFERENTIAL
HCT: 40 — AB (ref 41–53)
Hemoglobin: 14 (ref 13.5–17.5)
Platelets: 219 10*3/uL (ref 150–400)
WBC: 8.7

## 2022-11-29 LAB — COMPREHENSIVE METABOLIC PANEL
Albumin: 3.6 (ref 3.5–5.0)
Calcium: 8.8 (ref 8.7–10.7)
Globulin: 3.1

## 2022-11-29 LAB — CBC: RBC: 4.11 (ref 3.87–5.11)

## 2022-12-06 ENCOUNTER — Non-Acute Institutional Stay (SKILLED_NURSING_FACILITY): Admitting: Family Medicine

## 2022-12-06 DIAGNOSIS — J849 Interstitial pulmonary disease, unspecified: Secondary | ICD-10-CM

## 2022-12-06 DIAGNOSIS — F419 Anxiety disorder, unspecified: Secondary | ICD-10-CM | POA: Diagnosis not present

## 2022-12-06 DIAGNOSIS — I5030 Unspecified diastolic (congestive) heart failure: Secondary | ICD-10-CM

## 2022-12-06 DIAGNOSIS — I10 Essential (primary) hypertension: Secondary | ICD-10-CM

## 2022-12-06 NOTE — Progress Notes (Signed)
Provider:  Jacalyn Lefevre, MD Location:      Place of Service:     PCP: Mahlon Gammon, MD Patient Care Team: Mahlon Gammon, MD as PCP - General (Internal Medicine) Marykay Lex, MD as PCP - Cardiology (Cardiology)  Extended Emergency Contact Information Primary Emergency Contact: [facility] Address: [address]          [place], [place] [address] D[place] Home Phone: [phone] Mobile Phone: [phone] Relation: Spouse Secondary Emergency Contact: Doneta Public Address: [address]          [place], [place] Home Phone: [phone] Mobile Phone: [phone] Relation: Daughter  Code Status:  Goals of Care: Advanced Directive information    10/07/2022   10:15 AM  Advanced Directives  Does Patient Have a Medical Advance Directive? Yes  Type of Estate agent of [place];Living will;Out of facility DNR (pink MOST or yellow form)  Does patient want to make changes to medical advance directive? No - Patient declined  Copy of Healthcare Power of Attorney in Chart? Yes - validated most recent copy scanned in chart (See row information)  Pre-existing out of facility DNR order (yellow form or pink MOST form) Pink MOST/Yellow Form most recent copy in chart - Physician notified to receive inpatient order      No chief complaint on file.   HPI: Patient is a 89 y.o. male seen today for medical management of chronic problems including: Interstitial lung disease, O2 dependent, congestive heart failure, A-fib,, and anxiety and depression.  Patient spends most of his time in the room and in bed.  I have not seen him out of bed.  I am told that he used to spend some time in his recliner but not recently.  He is followed by hospice for chronic lung disease.  He is nonambulatory.  Uses mechanical lift for transfers.  There is some mild cognitive impairment  Past Medical History:  Diagnosis Date   Anxiety    CAD  S/P percutaneous coronary angioplasty 1996, 2009, 01/2010   PTCA of L Cx 1996; BMS-RCA [facility]) 2009; Staged PCI RCA (70% ISR) --> 3.0 mm x 23 mm BMS, staged PCI Diag - 2.0 mm x 12 mm Mini Vision BMS (2.25 mm)   Cataracts, bilateral    immature   Chronic back pain    scoliosis--pt states unable to have surgery bc of age   GERD (gastroesophageal reflux disease)    takes Nexium daily   History of colon polyps    Hyperlipidemia    Hypertension    Myocardial infarction (HCC) 1996, 2009   x 2;last time was about 8-62yrs ago    Obesity (BMI 30.0-34.9)    OSA (obstructive sleep apnea)    Osteoarthritis of both knees    Peripheral edema    Venous Stasis   Persistent atrial fibrillation (HCC)    Unable to maintaine NSR after DCCV with Tikosyn.  Plan = rate control w/diltiazem (beta blocker off b/c fatigue). CHA2DS2Vasc 4. DOAC - Eliquis.    Prostate cancer (HCC) 2005   seed implant   Past Surgical History:  Procedure Laterality Date   CARDIOVERSION N/A 06/12/2015   Procedure: CARDIOVERSION;  Surgeon: Thurmon Fair, MD;  Location: [facility] ENDOSCOPY;  Service: Cardiovascular;  Laterality: N/A;   CARDIOVERSION N/A 08/02/2015   Procedure: CARDIOVERSION;  Surgeon: Hillis Range, MD;  Location: [facility] OR;  Service: Cardiovascular;  Laterality: N/A;   CHOLECYSTECTOMY     COLONOSCOPY  CORONARY ANGIOPLASTY WITH STENT PLACEMENT  1996, 2009, August 2011   PTCA of L Cx 1996; BMS-RCA [facility]) 2009; Staged PCI RCA (70% ISR) --> 3.0 mm x 23 mm BMS, staged PCI Diag - 2.0 mm x 12 mm Mini Vision BMS (2.25 mm)   cyst removed  in college   NEUROPLASTY / TRANSPOSITION MEDIAN NERVE AT CARPAL TUNNEL BILATERAL     [facility] MY[facility]  November 2013   EF 53%; fixed mid inferior-inferolateral defect, infarct with no ischemia.   right knee arthrsoscopy  66yrs ago   right trigger finger     seeds placed into prostate   2005   TEE WITHOUT CARDIOVERSION N/A 06/12/2015   Procedure: TRANSESOPHAGEAL ECHOCARDIOGRAM (TEE) - WITH  CARDIOVERSION;  Surgeon: Thurmon Fair, MD;  Location: [facility] ENDOS[facility];  Service: Cardiovascular: EF 55-60%.No LA or LAA thrombus. Normal Peal PAP ~33 mmHg.   TOTAL KNEE ARTHROPLASTY  05/09/2012   Procedure: TOTAL KNEE ARTHROPLASTY;  Surgeon: Nestor Lewandowsky, MD;  Location: [facility] OR;  Service: Orthopedics;  Laterality: Right;   TRANSTHORACIC ECHOCARDIOGRAM  06/2015   EF 60-65%. Mild LA dilation. Normal PA pressures: 32 mmHg. Aortic sclerosis but no stenosis.   wisdom teeth extraccted      reports that he quit smoking about 38 years ago. His smoking use included pipe. He has never used smokeless tobacco. He reports current alcohol use. He reports that he does not use drugs. Social History   Socioeconomic History   Marital status: Married    Spouse name: Not on file   Number of children: Not on file   Years of education: Not on file   Highest education level: Not on file  Occupational History   Occupation: Retired  Tobacco Use   Smoking status: Former    Types: Pipe    Quit date: 03/12/1984    Years since quitting: 38.7   Smokeless tobacco: Never   Tobacco comments:    quit smoking pipe about 24yrs ago  Vaping Use   Vaping Use: Never used  Substance and Sexual Activity   Alcohol use: Yes    Comment: glass of wine daily   Drug use: No   Sexual activity: Not Currently  Other Topics Concern   Not on file  Social History Narrative   He is a WIDOWED father of 2, grandfather of 2.   hE LIVES @ [facility] (Independent / Assisted Living)   He does not smoke. Drinks occasional alcohol.    Social Determinants of Health   Financial Resource Strain: Not on file  Food Insecurity: Not on file  Transportation Needs: Not on file  Physical Activity: Not on file  Stress: Not on file  Social Connections: Not on file  Intimate Partner Violence: Not on file    Functional Status Survey:    Family History  Problem Relation Age of Onset   Cancer Mother 35       abdomen died in 15s   Heart  attack Father 23       died in 45s    Health Maintenance  Topic Date Due   COVID-19 Vaccine (9 - 2023-24 season) 06/02/2022   DTaP/Tdap/Td (3 - Td or Tdap) 12/12/2022   INFLUENZA VACCINE  01/05/2023   Pneumonia Vaccine 57+ Years old  Completed   Zoster Vaccines- Shingrix  Completed   HPV VACCINES  Aged Out    Allergies  Allergen Reactions   Crab [Shellfish Allergy] Rash   Codeine    Hydrocodone Hives  Adhesive [Tape] Rash   Oxycodone Rash    Outpatient Encounter Medications as of 12/06/2022  Medication Sig   acetaminophen (TYLENOL) 325 MG tablet Take 650 mg by mouth every 6 (six) hours as needed.   diltiazem (DILACOR XR) 240 MG 24 hr capsule Take 240 mg by mouth daily. For heart failure   ELIQUIS 5 MG TABS tablet TAKE 1 TABLET TWICE A DAY (SCHEDULE FOLLOW UP WITH CARDIOLOGIST PRIOR TO NEXT REFILL REQUEST)   escitalopram (LEXAPRO) 10 MG tablet Take 10 mg by mouth daily.   esomeprazole (NEXIUM) 40 MG capsule TAKE 1 CAPSULE DAILY   furosemide (LASIX) 40 MG tablet Take 40 mg by mouth daily.   lactose free nutrition (BOOST) LIQD Take 237 mLs by mouth 2 (two) times daily between meals.   mirabegron ER (MYRBETRIQ) 25 MG TB24 tablet Take 25 mg by mouth daily.    nitroGLYCERIN (NITROSTAT) 0.4 MG SL tablet Place 0.4 mg under the tongue as needed for chest pain.   OXYGEN 2lpm   Polyethylene Glycol 3350 (MIRALAX PO) Take 17 g by mouth daily.   Sodium Fluoride (PREVIDENT 5000 BOOSTER PLUS) 1.1 % PSTE Place 1 application onto teeth at bedtime.   tamsulosin (FLOMAX) 0.4 MG CAPS capsule Take 0.4 mg by mouth daily.    No facility-administered encounter medications on file as of 12/06/2022.    Review of Systems  Constitutional: Negative.   HENT:  Positive for hearing loss.   Respiratory:  Positive for shortness of breath.   Gastrointestinal: Negative.   Musculoskeletal:  Positive for gait problem.  Psychiatric/Behavioral:  Positive for confusion.   All other systems reviewed and are  negative.   There were no vitals filed for this visit. There is no height or weight on file to calculate BMI. Physical Exam Vitals and nursing note reviewed.  Constitutional:      Appearance: Normal appearance.  HENT:     Mouth/Throat:     Mouth: Mucous membranes are moist.     Pharynx: Oropharynx is clear.  Eyes:     Extraocular Movements: Extraocular movements intact.     Pupils: Pupils are equal, round, and reactive to light.  Cardiovascular:     Rate and Rhythm: Normal rate. Rhythm irregular.  Pulmonary:     Effort: Pulmonary effort is normal.     Breath sounds: Normal breath sounds.  Abdominal:     General: Bowel sounds are normal.     Palpations: Abdomen is soft.  Neurological:     General: No focal deficit present.     Mental Status: He is alert and oriented to person, place, and time.     Labs reviewed: Basic Metabolic Panel: Recent Labs    03/10/22 0000  NA 137  K 4.3  CL 100  CO2 25*  BUN 18  CREATININE 1.0  CALCIUM 9.3   Liver Function Tests: Recent Labs    03/10/22 0000  AST 12*  ALT 3*  ALKPHOS 70  ALBUMIN 3.7   No results for input(s): "LIPASE", "AMYLASE" in the last 8760 hours. No results for input(s): "AMMONIA" in the last 8760 hours. CBC: Recent Labs    03/10/22 0000  WBC 8.2  NEUTROABS 5,707.00  HGB 14.6  HCT 42  PLT 219   Cardiac Enzymes: No results for input(s): "CKTOTAL", "CKMB", "CKMBINDEX", "TROPONINI" in the last 8760 hours. BNP: Invalid input(s): "POCBNP" No results found for: "HGBA1C" Lab Results  Component Value Date   TSH 3.18 03/10/2022   Lab Results  Component Value  Date   VITAMINB12 335 04/03/2019   Lab Results  Component Value Date   FOLATE 13.7 04/03/2019   Lab Results  Component Value Date   IRON 30 04/03/2019   FERRITIN 32 04/03/2019    Imaging and Procedures obtained prior to SNF admission: CT Chest W Contrast  Result Date: 03/27/2019 CLINICAL DATA:  Follow-up lung nodule. EXAM: CT CHEST  WITH CONTRAST TECHNIQUE: Multidetector CT imaging of the chest was performed during intravenous contrast administration. CONTRAST:  80mL OMNIPAQUE IOHEXOL 300 MG/ML  SOLN COMPARISON:  11/23/2018 FINDINGS: Cardiovascular: Normal heart size. No pericardial effusion. 4 cm right ventricular outflow track is identified. Findings may reflect PA hypertension. Aortic atherosclerosis. Lad, left circumflex and RCA coronary artery calcifications. Mediastinum/Nodes: Normal appearance of the thyroid gland. The trachea appears patent and is midline. Normal appearance of the esophagus. Prominent mediastinal lymph nodes are identified including 1.6 cm right paratracheal lymph node, image 52/2. Previously this measured the same. 1.5 cm subcarinal lymph node is identified, image 72/2. Previously 1.3 cm. Enlarged right hilar node measures 2 cm, image 64/2. This is difficult to compare with the previous exam as the prior study was performed without IV contrast material. Lungs/Pleura: No pleural effusion identified. Diffuse bilateral interstitial reticulation is identified which has a lower lung zone predominance. Mild bronchiectasis. A mosaic attenuation pattern is noted bilaterally. Right middle lobe lung nodule is unchanged measuring 4 mm, image 93/3. right upper lobe lung nodule measuring 4 mm is stable from previous exam, image 46/3. Upper Abdomen: Unchanged 1.5 cm low-density left adrenal nodule. Previous cholecystectomy. Left upper pole kidney cysts noted. Musculoskeletal: Spondylosis within the thoracic spine. Unchanged lower thoracic spine compression fracture IMPRESSION: 1. Unchanged right upper lobe and right middle lobe lung nodules. No new or enlarging pulmonary nodules identified. No follow-up needed if patient is low-risk (and has no known or suspected primary neoplasm). Non-contrast chest CT can be considered at 12 months (from 11/23/2018) if patient is high-risk. This recommendation follows the consensus statement:  Guidelines for Management of Incidental Pulmonary Nodules Detected on CT Images: From the Fleischner Society 2017; Radiology 2017; 284:228-243. 2. Chronic interstitial lung disease with lower lung zone predominance and mild bronchiectasis. As mentioned previously findings may reflect nonspecific interstitial pneumonitis or early usual interstitial pneumonitis. 3. Multi vessel coronary artery calcifications noted. 4. Stable enlarged mediastinal and right hilar lymph nodes. 5. Stable left adrenal nodule. 6. Stable lower thoracic spine compression fracture. Enlargement of the right ventricular outflow tract may reflect underlying PA hypertension. 7. Unchanged lower thoracic spine compression fracture. Aortic Atherosclerosis (ICD10-I70.0). Electronically Signed   By: Signa Kell M.D.   On: 03/27/2019 15:45    Assessment/Plan 1. Anxiety Using Lexapro for anxiety and depression.  Seems to be effective  2. Diastolic congestive heart failure, unspecified HF chronicity (HCC) On furosemide  3. Essential hypertension On diltiazem for both A-fib and blood pressure  4. ILD (interstitial lung disease) (HCC) Chronic O2 use no longer sees pulmonology    Family/ staff Communication:   Labs/tests ordered:  Bertram Millard. Hyacinth Meeker, MD [facility] [address] [place], [place] [address] Office [phone]

## 2022-12-13 ENCOUNTER — Encounter: Payer: Self-pay | Admitting: Nurse Practitioner

## 2022-12-13 ENCOUNTER — Non-Acute Institutional Stay (SKILLED_NURSING_FACILITY): Payer: Medicare Other | Admitting: Nurse Practitioner

## 2022-12-13 DIAGNOSIS — D509 Iron deficiency anemia, unspecified: Secondary | ICD-10-CM

## 2022-12-13 DIAGNOSIS — K5900 Constipation, unspecified: Secondary | ICD-10-CM

## 2022-12-13 DIAGNOSIS — M1711 Unilateral primary osteoarthritis, right knee: Secondary | ICD-10-CM | POA: Diagnosis not present

## 2022-12-13 DIAGNOSIS — K219 Gastro-esophageal reflux disease without esophagitis: Secondary | ICD-10-CM

## 2022-12-13 DIAGNOSIS — N3942 Incontinence without sensory awareness: Secondary | ICD-10-CM

## 2022-12-13 DIAGNOSIS — J849 Interstitial pulmonary disease, unspecified: Secondary | ICD-10-CM | POA: Diagnosis not present

## 2022-12-13 DIAGNOSIS — G3184 Mild cognitive impairment, so stated: Secondary | ICD-10-CM

## 2022-12-13 DIAGNOSIS — F419 Anxiety disorder, unspecified: Secondary | ICD-10-CM

## 2022-12-13 DIAGNOSIS — I5030 Unspecified diastolic (congestive) heart failure: Secondary | ICD-10-CM

## 2022-12-13 DIAGNOSIS — I10 Essential (primary) hypertension: Secondary | ICD-10-CM

## 2022-12-13 DIAGNOSIS — I4819 Other persistent atrial fibrillation: Secondary | ICD-10-CM

## 2022-12-13 NOTE — Assessment & Plan Note (Signed)
stable, on Esomeprazole °

## 2022-12-13 NOTE — Assessment & Plan Note (Signed)
SNF [facility] for supportive care.  

## 2022-12-13 NOTE — Assessment & Plan Note (Signed)
stable, on Lexapro.  

## 2022-12-13 NOTE — Assessment & Plan Note (Signed)
O2 dependent, no further f/u Pulmonology 

## 2022-12-13 NOTE — Progress Notes (Unsigned)
Location:   SNF [facility]  Nursing Home Room Number: N005-A Place of Service:  SNF (31) Provider: [facility] Estefanny Moler NP  Mahlon Gammon, MD  Patient Care Team: Mahlon Gammon, MD as PCP - General (Internal Medicine) Marykay Lex, MD as PCP - Cardiology (Cardiology)  Extended Emergency Contact Information Primary Emergency Contact: [facility] Address: [address]          [place], [place] [address] [place] Home Phone: [phone] Mobile Phone: [phone] Relation: Spouse Secondary Emergency Contact: Doneta Public Address: [address]          [place], [place] Home Phone: [phone] Mobile Phone: [phone] Relation: Daughter  Code Status:  DNR Goals of care: Advanced Directive information    12/13/2022    2:17 PM  Advanced Directives  Does Patient Have a Medical Advance Directive? Yes  Type of Advance Directive Living will;Healthcare Power of [place];Out of facility DNR (pink MOST or yellow form)  Does patient want to make changes to medical advance directive? No - Patient declined  Copy of Healthcare Power of Attorney in Chart? Yes - validated most recent copy scanned in chart (See row information)  Pre-existing out of facility DNR order (yellow form or pink MOST form) Yellow form placed in chart (order not valid for inpatient use);Pink MOST form placed in chart (order not valid for inpatient use)     Chief Complaint  Patient presents with   Medical Management of Chronic Issues    Routine Visit   Quality Metric Gaps    Needs to discuss Covid and TDap    HPI:  Pt is a 89 y.o. male seen today for medical management of chronic diseases.   Gait abnormality, no longer ambulatory, mechanical lift for transfer, w/c for mobility             Mild cognitive impairment, SNF [facility] for supportive care.  Urinary incontinent, no urinary retention, on Mirabegron, Tamsulosin              Constipation, stable, on MiraLax              OA, uses mechanical lift for transfer, takes prn Tylenol.             Hx of Interstitial lung disease, O2 dependent, no further f/u Pulmonology             CHF/no edema BLE, on Furosamide. Bun/creat 18/0.96 03/10/22 Anxiety/depression, stable, on Lexapro.              Afib, heart rate is in control, on Diltiazem, Eliquis, TSH 3.18 03/10/22             GERD, stable, on Esomeprazole             Anemia, stable, off Fe. Hgb 14.6 03/10/22             HTN,  on Diltiazem, Furosemide, Bun/creat 18/0.96 03/10/22             OSA refused CPAP             Hyperlipidemia, takes Atorvastatin, LDL 70 10/22/21                  Past Medical History:  Diagnosis Date   Anxiety    CAD S/P percutaneous coronary angioplasty 1996, 2009, 01/2010   PTCA of L Cx 1996; BMS-RCA [facility]) 2009; Staged PCI RCA (70% ISR) --> 3.0 mm x 23 mm BMS, staged PCI Diag - 2.0  mm x 12 mm Mini Vision BMS (2.25 mm)   Cataracts, bilateral    immature   Chronic back pain    scoliosis--pt states unable to have surgery bc of age   GERD (gastroesophageal reflux disease)    takes Nexium daily   History of colon polyps    Hyperlipidemia    Hypertension    Myocardial infarction (HCC) 1996, 2009   x 2;last time was about 8-41yrs ago    Obesity (BMI 30.0-34.9)    OSA (obstructive sleep apnea)    Osteoarthritis of both knees    Peripheral edema    Venous Stasis   Persistent atrial fibrillation (HCC)    Unable to maintaine NSR after DCCV with Tikosyn.  Plan = rate control w/diltiazem (beta blocker off b/c fatigue). CHA2DS2Vasc 4. DOAC - Eliquis.    Prostate cancer (HCC) 2005   seed implant   Past Surgical History:  Procedure Laterality Date   CARDIOVERSION N/A 06/12/2015   Procedure: CARDIOVERSION;  Surgeon: Thurmon Fair, MD;  Location: [facility] ENDOSCOPY;  Service: Cardiovascular;  Laterality: N/A;   CARDIOVERSION N/A 08/02/2015   Procedure: CARDIOVERSION;  Surgeon: Hillis Range, MD;  Location: [facility] OR;  Service: Cardiovascular;  Laterality:  N/A;   CHOLECYSTECTOMY     COLONOSCOPY     CORONARY ANGIOPLASTY WITH STENT PLACEMENT  1996, 2009, August 2011   PTCA of L Cx 1996; BMS-RCA [facility]) 2009; Staged PCI RCA (70% ISR) --> 3.0 mm x 23 mm BMS, staged PCI Diag - 2.0 mm x 12 mm Mini Vision BMS (2.25 mm)   cyst removed  in college   NEUROPLASTY / TRANSPOSITION MEDIAN NERVE AT CARPAL TUNNEL BILATERAL     NM MYOVIEW LTD  November 2013   EF 53%; fixed mid inferior-inferolateral defect, infarct with no ischemia.   right knee arthrsoscopy  15yrs ago   right trigger finger     seeds placed into prostate   2005   TEE WITHOUT CARDIOVERSION N/A 06/12/2015   Procedure: TRANSESOPHAGEAL ECHOCARDIOGRAM (TEE) - WITH CARDIOVERSION;  Surgeon: Thurmon Fair, MD;  Location: [facility] ENDOS[facility];  Service: Cardiovascular: EF 55-60%.No LA or LAA thrombus. Normal Peal PAP ~33 mmHg.   TOTAL KNEE ARTHROPLASTY  05/09/2012   Procedure: TOTAL KNEE ARTHROPLASTY;  Surgeon: Nestor Lewandowsky, MD;  Location: [facility] OR;  Service: Orthopedics;  Laterality: Right;   TRANSTHORACIC ECHOCARDIOGRAM  06/2015   EF 60-65%. Mild LA dilation. Normal PA pressures: 32 mmHg. Aortic sclerosis but no stenosis.   wisdom teeth extraccted      Allergies  Allergen Reactions   Crab [Shellfish Allergy] Rash   Codeine    Hydrocodone Hives   Adhesive [Tape] Rash   Oxycodone Rash    Allergies as of 12/13/2022       Reactions   Crab [shellfish Allergy] Rash   Codeine    Hydrocodone Hives   Adhesive [tape] Rash   Oxycodone Rash        Medication List        Accurate as of December 13, 2022 11:59 PM. If you have any questions, ask your nurse or doctor.          acetaminophen 325 MG tablet Commonly known as: TYLENOL Take 650 mg by mouth every 6 (six) hours as needed.   diltiazem 240 MG 24 hr capsule Commonly known as: DILACOR XR Take 240 mg by mouth daily. For heart failure   Eliquis 5 MG Tabs tablet Generic drug: apixaban TAKE 1 TABLET TWICE A DAY (SCHEDULE FOLLOW UP WITH  CARDIOLOGIST PRIOR TO NEXT REFILL REQUEST)   escitalopram 10 MG tablet Commonly known as: LEXAPRO Take 10 mg by mouth daily.   esomeprazole 40 MG capsule Commonly known as: NEXIUM TAKE 1 CAPSULE DAILY   furosemide 40 MG tablet Commonly known as: LASIX Take 40 mg by mouth daily.   lactose free nutrition Liqd Take 237 mLs by mouth 2 (two) times daily between meals.   mirabegron ER 25 MG Tb24 tablet Commonly known as: MYRBETRIQ Take 25 mg by mouth daily.   MIRALAX PO Take 17 g by mouth daily.   nitroGLYCERIN 0.4 MG SL tablet Commonly known as: NITROSTAT Place 0.4 mg under the tongue as needed for chest pain.   OXYGEN 2lpm   PreviDent 5000 Booster Plus 1.1 % Pste Generic drug: Sodium Fluoride Place 1 application onto teeth at bedtime.   tamsulosin 0.4 MG Caps capsule Commonly known as: FLOMAX Take 0.4 mg by mouth daily.        Review of Systems  Constitutional:  Negative for appetite change, fatigue and fever.  HENT:  Positive for hearing loss. Negative for congestion and trouble swallowing.   Eyes:  Negative for visual disturbance.  Respiratory:  Positive for shortness of breath. Negative for cough.        O2 dependent. DOE  Cardiovascular:  Negative for leg swelling.  Gastrointestinal:  Negative for abdominal pain and constipation.       Incontinent of feces.   Genitourinary:  Positive for frequency. Negative for dysuria and urgency.       Incontinent of urine.   Musculoskeletal:  Positive for arthralgias and gait problem.       Left knee pain is chronic.   Skin:  Negative for color change.  Neurological:  Negative for speech difficulty, weakness and headaches.       Memory lapses.   Psychiatric/Behavioral:  Negative for behavioral problems and sleep disturbance. The patient is nervous/anxious.        Anxious at times.     Immunization History  Administered Date(s) Administered   Diphtheria 12/11/2012   H1N1 04/13/2010   Influenza, High Dose Seasonal  PF 02/12/2014, 03/29/2018, 01/05/2019, 04/04/2022   Influenza-Unspecified 03/06/2013, 02/12/2014, 03/06/2017, 03/17/2020, 03/24/2021   Moderna SARS-COV2 Booster Vaccination 11/03/2020, 10/22/2021   Moderna Sars-Covid-2 Vaccination 06/08/2019, 07/06/2019, 04/14/2020   PFIZER(Purple Top)SARS-COV-2 Vaccination 02/23/2021   Pfizer Covid-19 Vaccine Bivalent Booster 75yrs & up 10/22/2021, 04/07/2022   Pneumococcal Conjugate-13 02/12/2014   Pneumococcal Polysaccharide-23 02/04/2013, 02/12/2014   Td 09/04/2005   Tdap 12/11/2012   Zoster Recombinant(Shingrix) 08/27/2021, 10/27/2021   Zoster, Live 07/07/2006   Pertinent  Health Maintenance Due  Topic Date Due   INFLUENZA VACCINE  01/05/2023      01/30/2019    5:00 PM 04/01/2022    2:39 PM 06/17/2022    9:34 AM 07/11/2022    4:29 PM  Fall Risk  Falls in the past year?  0 0 0  Was there an injury with Fall?  0 0   Fall Risk Category Calculator  0 0   Fall Risk Category (Retired)  Low Low   (RETIRED) Patient Fall Risk Level Low fall risk Low fall risk Low fall risk   Patient at Risk for Falls Due to  No Fall Risks No Fall Risks No Fall Risks  Fall risk Follow up  Falls evaluation completed Falls evaluation completed Falls evaluation completed   Functional Status Survey:    Vitals:   12/13/22 1413  BP: 120/70  Pulse: 81  Resp:  18  Temp: 97.7 F (36.5 C)  SpO2: 98%  Weight: 173 lb 12.8 oz (78.8 kg)  Height: 5\' 7"  (1.702 m)   Body mass index is 27.22 kg/m. Physical Exam Vitals and nursing note reviewed.  Constitutional:      Appearance: Normal appearance.  HENT:     Head: Normocephalic and atraumatic.  Eyes:     Extraocular Movements: Extraocular movements intact.     Conjunctiva/sclera: Conjunctivae normal.     Pupils: Pupils are equal, round, and reactive to light.  Cardiovascular:     Rate and Rhythm: Normal rate. Rhythm irregular.     Heart sounds: No murmur heard. Pulmonary:     Breath sounds: No wheezing.      Comments: O2 dependent.  Abdominal:     General: Bowel sounds are normal.     Palpations: Abdomen is soft.     Tenderness: There is no abdominal tenderness.  Musculoskeletal:     Cervical back: Normal range of motion and neck supple.     Right lower leg: No edema.     Left lower leg: No edema.     Comments: Mechanical lift for transfer.   Skin:    General: Skin is warm and dry.  Neurological:     General: No focal deficit present.     Mental Status: He is alert and oriented to person, place, and time. Mental status is at baseline.     Gait: Gait abnormal.  Psychiatric:        Mood and Affect: Mood normal.        Behavior: Behavior normal.        Thought Content: Thought content normal.     Labs reviewed: Recent Labs    03/10/22 0000  NA 137  K 4.3  CL 100  CO2 25*  BUN 18  CREATININE 1.0  CALCIUM 9.3   Recent Labs    03/10/22 0000  AST 12*  ALT 3*  ALKPHOS 70  ALBUMIN 3.7   Recent Labs    03/10/22 0000  WBC 8.2  NEUTROABS 5,707.00  HGB 14.6  HCT 42  PLT 219   Lab Results  Component Value Date   TSH 3.18 03/10/2022   No results found for: "HGBA1C" Lab Results  Component Value Date   CHOL 124 10/22/2021   HDL 39 10/22/2021   LDLCALC 70 10/22/2021   TRIG 69 10/22/2021   CHOLHDL 2.9 02/12/2014    Significant Diagnostic Results in last 30 days:  No results found.  Assessment/Plan  Urinary incontinence no urinary retention, on Mirabegron, Tamsulosin   Constipation  stable, on MiraLax  Osteoarthritis of right knee uses mechanical lift for transfer, takes prn Tylenol.  ILD (interstitial lung disease) (HCC)  O2 dependent, no further f/u Pulmonology  Diastolic CHF (HCC) no edema BLE, on Furosamide. Bun/creat 18/0.96 03/10/22  Anxiety stable, on Lexapro.   Persistent atrial fibrillation (HCC):  CHA2DS2-VASc Score 4; On Eliquis heart rate is in control, on Diltiazem, Eliquis, TSH 3.18 03/10/22  GERD (gastroesophageal reflux disease)   stable, on Esomeprazole  Iron deficiency anemia stable, off Fe. Hgb 14.6 03/10/22  Essential hypertension Blood pressure is controlled, on Diltiazem, Furosemide, Bun/creat 18/0.96 03/10/22  MCI (mild cognitive impairment) with memory loss SNF [facility] for supportive care   Family/ staff Communication: plan of care reviewed with the patient and charge nurse.   Labs/tests ordered:  none  Time spend 35 minutes.

## 2022-12-13 NOTE — Assessment & Plan Note (Signed)
uses mechanical lift for transfer, takes prn Tylenol.   

## 2022-12-13 NOTE — Assessment & Plan Note (Signed)
stable, on MiraLax  ?

## 2022-12-13 NOTE — Assessment & Plan Note (Signed)
no urinary retention, on Mirabegron, Tamsulosin  

## 2022-12-13 NOTE — Assessment & Plan Note (Signed)
no edema BLE, on Furosamide. Bun/creat 18/0.96 03/10/22 

## 2022-12-13 NOTE — Assessment & Plan Note (Signed)
stable, off Fe. Hgb 14.6 03/10/22 

## 2022-12-13 NOTE — Assessment & Plan Note (Signed)
heart rate is in control, on Diltiazem, Eliquis, TSH 3.18 03/10/22 

## 2022-12-13 NOTE — Assessment & Plan Note (Signed)
Blood pressure is controlled, on Diltiazem, Furosemide, Bun/creat 18/0.96 03/10/22 

## 2023-01-04 ENCOUNTER — Other Ambulatory Visit: Payer: Self-pay | Admitting: Adult Health

## 2023-01-04 DIAGNOSIS — F419 Anxiety disorder, unspecified: Secondary | ICD-10-CM

## 2023-01-04 MED ORDER — LORAZEPAM 0.5 MG PO TABS: 0.5000 mg | ORAL_TABLET | Freq: Two times a day (BID) | ORAL | 0 refills | Status: DC | PRN

## 2023-01-17 ENCOUNTER — Encounter: Payer: Self-pay | Admitting: Nurse Practitioner

## 2023-01-17 ENCOUNTER — Non-Acute Institutional Stay (SKILLED_NURSING_FACILITY): Payer: Medicare Other | Admitting: Nurse Practitioner

## 2023-01-17 DIAGNOSIS — K5900 Constipation, unspecified: Secondary | ICD-10-CM

## 2023-01-17 DIAGNOSIS — I10 Essential (primary) hypertension: Secondary | ICD-10-CM | POA: Diagnosis not present

## 2023-01-17 DIAGNOSIS — M1711 Unilateral primary osteoarthritis, right knee: Secondary | ICD-10-CM

## 2023-01-17 DIAGNOSIS — G3184 Mild cognitive impairment, so stated: Secondary | ICD-10-CM

## 2023-01-17 DIAGNOSIS — E785 Hyperlipidemia, unspecified: Secondary | ICD-10-CM | POA: Diagnosis not present

## 2023-01-17 DIAGNOSIS — I4819 Other persistent atrial fibrillation: Secondary | ICD-10-CM

## 2023-01-17 DIAGNOSIS — F419 Anxiety disorder, unspecified: Secondary | ICD-10-CM

## 2023-01-17 DIAGNOSIS — Z66 Do not resuscitate: Secondary | ICD-10-CM

## 2023-01-17 DIAGNOSIS — D509 Iron deficiency anemia, unspecified: Secondary | ICD-10-CM

## 2023-01-17 DIAGNOSIS — I5030 Unspecified diastolic (congestive) heart failure: Secondary | ICD-10-CM

## 2023-01-17 DIAGNOSIS — K219 Gastro-esophageal reflux disease without esophagitis: Secondary | ICD-10-CM

## 2023-01-17 DIAGNOSIS — N3942 Incontinence without sensory awareness: Secondary | ICD-10-CM

## 2023-01-17 DIAGNOSIS — J849 Interstitial pulmonary disease, unspecified: Secondary | ICD-10-CM

## 2023-01-17 NOTE — Assessment & Plan Note (Signed)
takes Atorvastatin, LDL 70 10/22/21

## 2023-01-17 NOTE — Assessment & Plan Note (Signed)
stable, off Fe. Hgb 14 11/29/22

## 2023-01-17 NOTE — Assessment & Plan Note (Signed)
Blood pressure is controlled,   on Diltiazem, Furosemide

## 2023-01-17 NOTE — Assessment & Plan Note (Signed)
Compensated, no edema BLE, on Furosamide. Bun/creat 21/0.9 11/29/22

## 2023-01-17 NOTE — Assessment & Plan Note (Signed)
stable, on Esomeprazole 

## 2023-01-17 NOTE — Assessment & Plan Note (Signed)
O2 dependent, no further f/u Pulmonology

## 2023-01-17 NOTE — Assessment & Plan Note (Signed)
stable, on MiraLax 

## 2023-01-17 NOTE — Assessment & Plan Note (Signed)
heart rate is in control, on Diltiazem, Eliquis, TSH 3.18 03/10/22

## 2023-01-17 NOTE — Assessment & Plan Note (Signed)
uses mechanical lift for transfer, takes prn Tylenol. 

## 2023-01-17 NOTE — Assessment & Plan Note (Signed)
SNF [facility] for supportive care.

## 2023-01-17 NOTE — Assessment & Plan Note (Signed)
no urinary retention, on Mirabegron, Tamsulosin

## 2023-01-17 NOTE — Assessment & Plan Note (Signed)
His mood is  stable, on Lexapro.

## 2023-01-17 NOTE — Progress Notes (Unsigned)
Location:  [facility] Room Number: N005-A Place of Service:  SNF [phone]) Provider:  Chipper Oman, NP   Patient Care Team: Mahlon Gammon, MD as PCP - General (Internal Medicine) Marykay Lex, MD as PCP - Cardiology (Cardiology)  Extended Emergency Contact Information Primary Emergency Contact: [facility] Address: [address]          [place], [place] [address] [place] Home Phone: [phone] Mobile Phone: [phone] Relation: Spouse Secondary Emergency Contact: Doneta Public Address: [address]          Elizabeth, MD [place] Home Phone: [phone] Mobile Phone: [phone] Relation: Daughter  Code Status:  DNR Goals of care: Advanced Directive information    01/17/2023    9:27 AM  Advanced Directives  Does Patient Have a Medical Advance Directive? Yes  Type of Advance Directive Living will;Healthcare Power of [place];Out of facility DNR (pink MOST or yellow form)  Does patient want to make changes to medical advance directive? No - Patient declined  Copy of Healthcare Power of Attorney in Chart? Yes - validated most recent copy scanned in chart (See row information)  Pre-existing out of facility DNR order (yellow form or pink MOST form) Yellow form placed in chart (order not valid for inpatient use);Pink MOST form placed in chart (order not valid for inpatient use)     Chief Complaint  Patient presents with   Medical Management of Chronic Issues    Routine visit, discuss need for covid booster, td/tdap, and flu vaccine     HPI:  Pt is a 89 y.o. male seen today for medical management of chronic diseases.   Gait abnormality, no longer ambulatory, mechanical lift for transfer, w/c for mobility             Mild cognitive impairment, SNF [facility] for supportive care.  Urinary incontinent, no urinary retention, on Mirabegron, Tamsulosin              Constipation, stable, on MiraLax             OA, uses  mechanical lift for transfer, takes prn Tylenol.             Hx of Interstitial lung disease, O2 dependent, no further f/u Pulmonology             CHF/no edema BLE, on Furosamide. Bun/creat 21/0.9 11/29/22 Anxiety/depression, stable, on Lexapro.              Afib, heart rate is in control, on Diltiazem, Eliquis, TSH 3.18 03/10/22             GERD, stable, on Esomeprazole             Anemia, stable, off Fe. Hgb 14 11/29/22             HTN,  on Diltiazem, Furosemide             OSA refused CPAP             Hyperlipidemia, takes Atorvastatin, LDL 70 10/22/21                 Past Medical History:  Diagnosis Date   Anxiety    CAD S/P percutaneous coronary angioplasty 1996, 2009, 01/2010   PTCA of L Cx 1996; BMS-RCA [facility]) 2009; Staged PCI RCA (70% ISR) --> 3.0 mm x 23 mm BMS, staged PCI Diag - 2.0 mm x 12 mm Mini Vision BMS (2.25 mm)   Cataracts,  bilateral    immature   Chronic back pain    scoliosis--pt states unable to have surgery bc of age   GERD (gastroesophageal reflux disease)    takes Nexium daily   History of colon polyps    Hyperlipidemia    Hypertension    Myocardial infarction (HCC) 1996, 2009   x 2;last time was about 8-73yrs ago    Obesity (BMI 30.0-34.9)    OSA (obstructive sleep apnea)    Osteoarthritis of both knees    Peripheral edema    Venous Stasis   Persistent atrial fibrillation (HCC)    Unable to maintaine NSR after DCCV with Tikosyn.  Plan = rate control w/diltiazem (beta blocker off b/c fatigue). CHA2DS2Vasc 4. DOAC - Eliquis.    Prostate cancer (HCC) 2005   seed implant   Past Surgical History:  Procedure Laterality Date   CARDIOVERSION N/A 06/12/2015   Procedure: CARDIOVERSION;  Surgeon: Thurmon Fair, MD;  Location: [facility] ENDOSCOPY;  Service: Cardiovascular;  Laterality: N/A;   CARDIOVERSION N/A 08/02/2015   Procedure: CARDIOVERSION;  Surgeon: Hillis Range, MD;  Location: [facility] OR;  Service: Cardiovascular;  Laterality: N/A;   CHOLECYSTECTOMY     COLONOSCOPY      CORONARY ANGIOPLASTY WITH STENT PLACEMENT  1996, 2009, August 2011   PTCA of L Cx 1996; BMS-RCA [facility]) 2009; Staged PCI RCA (70% ISR) --> 3.0 mm x 23 mm BMS, staged PCI Diag - 2.0 mm x 12 mm Mini Vision BMS (2.25 mm)   cyst removed  in college   NEUROPLASTY / TRANSPOSITION MEDIAN NERVE AT CARPAL TUNNEL BILATERAL     [facility]  November 2013   EF 53%; fixed mid inferior-inferolateral defect, infarct with no ischemia.   right knee arthrsoscopy  34yrs ago   right trigger finger     seeds placed into prostate   2005   TEE WITHOUT CARDIOVERSION N/A 06/12/2015   Procedure: TRANSESOPHAGEAL ECHOCARDIOGRAM (TEE) - WITH CARDIOVERSION;  Surgeon: Thurmon Fair, MD;  Location: [facility] ENDOSCOPY;  Service: Cardiovascular: EF 55-60%.No LA or LAA thrombus. Normal Peal PAP ~33 mmHg.   TOTAL KNEE ARTHROPLASTY  05/09/2012   Procedure: TOTAL KNEE ARTHROPLASTY;  Surgeon: Nestor Lewandowsky, MD;  Location: [facility] OR;  Service: Orthopedics;  Laterality: Right;   TRANSTHORACIC ECHOCARDIOGRAM  06/2015   EF 60-65%. Mild LA dilation. Normal PA pressures: 32 mmHg. Aortic sclerosis but no stenosis.   wisdom teeth extraccted      Allergies  Allergen Reactions   Crab [Shellfish Allergy] Rash   Codeine    Hydrocodone Hives   Adhesive [Tape] Rash   Oxycodone Rash    Outpatient Encounter Medications as of 01/17/2023  Medication Sig   acetaminophen (TYLENOL) 325 MG tablet Take 650 mg by mouth every 6 (six) hours as needed.   diltiazem (DILACOR XR) 240 MG 24 hr capsule Take 240 mg by mouth daily. For heart failure   ELIQUIS 5 MG TABS tablet TAKE 1 TABLET TWICE A DAY (SCHEDULE FOLLOW UP WITH CARDIOLOGIST PRIOR TO NEXT REFILL REQUEST)   escitalopram (LEXAPRO) 10 MG tablet Take 10 mg by mouth daily.   esomeprazole (NEXIUM) 40 MG capsule TAKE 1 CAPSULE DAILY   furosemide (LASIX) 40 MG tablet Take 40 mg by mouth daily.   lactose free nutrition (BOOST) LIQD Take 237 mLs by mouth 2 (two) times daily between meals.   LORazepam  (ATIVAN) 0.5 MG tablet Take 1 tablet (0.5 mg total) by mouth 2 (two) times daily as needed for anxiety.  mirabegron ER (MYRBETRIQ) 25 MG TB24 tablet Take 25 mg by mouth daily.    nitroGLYCERIN (NITROSTAT) 0.4 MG SL tablet Place 0.4 mg under the tongue as needed for chest pain.   OXYGEN 2lpm   Polyethylene Glycol 3350 (MIRALAX PO) Take 17 g by mouth daily.   Sodium Fluoride (PREVIDENT 5000 BOOSTER PLUS) 1.1 % PSTE Place 1 application onto teeth at bedtime.   tamsulosin (FLOMAX) 0.4 MG CAPS capsule Take 0.4 mg by mouth daily.    No facility-administered encounter medications on file as of 01/17/2023.    Review of Systems  Constitutional:  Negative for fatigue, fever and unexpected weight change.  HENT:  Positive for hearing loss. Negative for congestion and trouble swallowing.   Eyes:  Negative for visual disturbance.  Respiratory:  Positive for shortness of breath. Negative for cough.        O2 dependent. DOE  Cardiovascular:  Negative for leg swelling.  Gastrointestinal:  Negative for abdominal pain and constipation.       Incontinent of feces.   Genitourinary:  Positive for frequency. Negative for dysuria and urgency.       Incontinent of urine.   Musculoskeletal:  Positive for arthralgias and gait problem.       Left knee pain is chronic.   Skin:  Negative for color change.  Neurological:  Negative for speech difficulty, weakness and headaches.       Memory lapses.   Psychiatric/Behavioral:  Negative for behavioral problems and sleep disturbance. The patient is nervous/anxious.        Anxious at times.     Immunization History  Administered Date(s) Administered   Diphtheria 12/11/2012   H1N1 04/13/2010   Influenza, High Dose Seasonal PF 02/12/2014, 03/29/2018, 01/05/2019, 04/04/2022   Influenza-Unspecified 03/06/2013, 02/12/2014, 03/06/2017, 03/17/2020, 03/24/2021   Moderna SARS-COV2 Booster Vaccination 11/03/2020, 10/22/2021   Moderna Sars-Covid-2 Vaccination 06/08/2019,  07/06/2019, 04/14/2020   PFIZER(Purple Top)SARS-COV-2 Vaccination 02/23/2021   Pfizer Covid-19 Vaccine Bivalent Booster 45yrs & up 10/22/2021, 04/07/2022   Pneumococcal Conjugate-13 02/12/2014   Pneumococcal Polysaccharide-23 02/04/2013, 02/12/2014   Td 09/04/2005   Tdap 12/11/2012   Zoster Recombinant(Shingrix) 08/27/2021, 10/27/2021   Zoster, Live 07/07/2006   Pertinent  Health Maintenance Due  Topic Date Due   INFLUENZA VACCINE  01/05/2023      01/30/2019    5:00 PM 04/01/2022    2:39 PM 06/17/2022    9:34 AM 07/11/2022    4:29 PM  Fall Risk  Falls in the past year?  0 0 0  Was there an injury with Fall?  0 0   Fall Risk Category Calculator  0 0   Fall Risk Category (Retired)  Low Low   (RETIRED) Patient Fall Risk Level Low fall risk Low fall risk Low fall risk   Patient at Risk for Falls Due to  No Fall Risks No Fall Risks No Fall Risks  Fall risk Follow up  Falls evaluation completed Falls evaluation completed Falls evaluation completed   Functional Status Survey:    Vitals:   01/17/23 0926  BP: 117/69  Pulse: 83  SpO2: 94%  Weight: 177 lb 8 oz (80.5 kg)  Height: 5\' 7"  (1.702 m)   Body mass index is 27.8 kg/m. Physical Exam Vitals and nursing note reviewed.  Constitutional:      Appearance: Normal appearance.  HENT:     Head: Normocephalic and atraumatic.  Eyes:     Extraocular Movements: Extraocular movements intact.     Conjunctiva/sclera: Conjunctivae normal.  Pupils: Pupils are equal, round, and reactive to light.  Cardiovascular:     Rate and Rhythm: Normal rate. Rhythm irregular.     Heart sounds: No murmur heard. Pulmonary:     Breath sounds: No wheezing.     Comments: O2 dependent.  Abdominal:     General: Bowel sounds are normal.     Palpations: Abdomen is soft.     Tenderness: There is no abdominal tenderness.  Musculoskeletal:     Cervical back: Normal range of motion and neck supple.     Right lower leg: No edema.     Left lower leg: No  edema.     Comments: Mechanical lift for transfer.   Skin:    General: Skin is warm and dry.  Neurological:     General: No focal deficit present.     Mental Status: He is alert and oriented to person, place, and time. Mental status is at baseline.     Gait: Gait abnormal.  Psychiatric:        Mood and Affect: Mood normal.        Behavior: Behavior normal.        Thought Content: Thought content normal.     Labs reviewed: Recent Labs    03/10/22 0000 11/29/22 0000  NA 137 142  K 4.3 4.3  CL 100 104  CO2 25* 28*  BUN 18 21  CREATININE 1.0 0.9  CALCIUM 9.3 8.8   Recent Labs    03/10/22 0000 11/29/22 0000  AST 12* 12*  ALT 3* 5*  ALKPHOS 70 68  ALBUMIN 3.7 3.6   Recent Labs    03/10/22 0000 11/29/22 0000  WBC 8.2 8.7  NEUTROABS 5,707.00  --   HGB 14.6 14.0  HCT 42 40*  PLT 219 219   Lab Results  Component Value Date   TSH 3.18 03/10/2022   No results found for: "HGBA1C" Lab Results  Component Value Date   CHOL 124 10/22/2021   HDL 39 10/22/2021   LDLCALC 70 10/22/2021   TRIG 69 10/22/2021   CHOLHDL 2.9 02/12/2014    Significant Diagnostic Results in last 30 days:  No results found.  Assessment/Plan Essential hypertension Blood pressure is controlled,  on Diltiazem, Furosemide  Hyperlipidemia with target LDL less than 70 takes Atorvastatin, LDL 70 10/22/21  Iron deficiency anemia stable, off Fe. Hgb 14 11/29/22  GERD (gastroesophageal reflux disease) stable, on Esomeprazole  Persistent atrial fibrillation (HCC):  CHA2DS2-VASc Score 4; On Eliquis heart rate is in control, on Diltiazem, Eliquis, TSH 3.18 03/10/22  Anxiety His mood is stable, on Lexapro.   Diastolic CHF (HCC) Compensated, no edema BLE, on Furosamide. Bun/creat 21/0.9 11/29/22  ILD (interstitial lung disease) (HCC) O2 dependent, no further f/u Pulmonology  Osteoarthritis of right knee uses mechanical lift for transfer, takes prn Tylenol.  Constipation stable, on  MiraLax  Urinary incontinence no urinary retention, on Mirabegron, Tamsulosin   MCI (mild cognitive impairment) with memory loss  SNF [facility] for supportive care.      Family/ staff Communication: plan of care reviewed with the patient and charge nurse.   Labs/tests ordered:  none  Time spend 25 minutes.

## 2023-01-19 ENCOUNTER — Encounter: Payer: Self-pay | Admitting: Nurse Practitioner

## 2023-01-19 ENCOUNTER — Non-Acute Institutional Stay (SKILLED_NURSING_FACILITY): Payer: Medicare Other | Admitting: Nurse Practitioner

## 2023-01-19 DIAGNOSIS — M1711 Unilateral primary osteoarthritis, right knee: Secondary | ICD-10-CM | POA: Diagnosis not present

## 2023-01-19 DIAGNOSIS — N3942 Incontinence without sensory awareness: Secondary | ICD-10-CM | POA: Diagnosis not present

## 2023-01-19 DIAGNOSIS — J849 Interstitial pulmonary disease, unspecified: Secondary | ICD-10-CM

## 2023-01-19 DIAGNOSIS — K5900 Constipation, unspecified: Secondary | ICD-10-CM

## 2023-01-19 DIAGNOSIS — I5030 Unspecified diastolic (congestive) heart failure: Secondary | ICD-10-CM

## 2023-01-19 DIAGNOSIS — U071 COVID-19: Secondary | ICD-10-CM | POA: Diagnosis not present

## 2023-01-19 DIAGNOSIS — F419 Anxiety disorder, unspecified: Secondary | ICD-10-CM

## 2023-01-19 DIAGNOSIS — I10 Essential (primary) hypertension: Secondary | ICD-10-CM

## 2023-01-19 DIAGNOSIS — I4819 Other persistent atrial fibrillation: Secondary | ICD-10-CM

## 2023-01-19 NOTE — Progress Notes (Signed)
This encounter was created in error - please disregard.

## 2023-01-19 NOTE — Assessment & Plan Note (Signed)
heart rate is in control, on Diltiazem, Eliquis, TSH 3.18 03/10/22

## 2023-01-19 NOTE — Assessment & Plan Note (Signed)
Blood pressure is controlled,   on Diltiazem, Furosemide

## 2023-01-19 NOTE — Assessment & Plan Note (Signed)
no urinary retention, on Mirabegron, Tamsulosin

## 2023-01-19 NOTE — Assessment & Plan Note (Signed)
stable, on Lexapro.  

## 2023-01-19 NOTE — Assessment & Plan Note (Signed)
CHF/no edema BLE, hold Furosamide x 5days 2/2 poor oral intake and no apparent edema. Bun/creat 21/0.9 11/29/22

## 2023-01-19 NOTE — Assessment & Plan Note (Signed)
stable, on Esomeprazole 

## 2023-01-19 NOTE — Assessment & Plan Note (Signed)
stable, on MiraLax 

## 2023-01-19 NOTE — Assessment & Plan Note (Signed)
Hx of Interstitial lung disease, O2 dependent, no further f/u Pulmonology

## 2023-01-19 NOTE — Assessment & Plan Note (Signed)
uses mechanical lift for transfer, takes prn Tylenol. 

## 2023-01-19 NOTE — Progress Notes (Signed)
Location:   [facility] Room Number: 5 Place of Service:  SNF (31) Provider: [facility]  NP  Mahlon Gammon, MD  Patient Care Team: Mahlon Gammon, MD as PCP - General (Internal Medicine) Marykay Lex, MD as PCP - Cardiology (Cardiology)  Extended Emergency Contact Information Primary Emergency Contact: [facility] Address: [address]          [place], [place] [address] [place] Home Phone: [phone] Mobile Phone: [phone] Relation: Spouse Secondary Emergency Contact: Doneta Public Address: [address]          [place], [place] Home Phone: [phone] Mobile Phone: [phone] Relation: Daughter  Code Status: DNR Goals of care: Advanced Directive information    01/17/2023    9:27 AM  Advanced Directives  Does Patient Have a Medical Advance Directive? Yes  Type of Advance Directive Living will;Healthcare Power of [place];Out of facility DNR (pink MOST or yellow form)  Does patient want to make changes to medical advance directive? No - Patient declined  Copy of Healthcare Power of Attorney in Chart? Yes - validated most recent copy scanned in chart (See row information)  Pre-existing out of facility DNR order (yellow form or pink MOST form) Yellow form placed in chart (order not valid for inpatient use);Pink MOST form placed in chart (order not valid for inpatient use)     Chief Complaint  Patient presents with   Acute Visit    Covid positive, lethargy, poor appetite, limited oral intake, vomited x1    HPI:  Pt is a 89 y.o. male seen today for an acute visit for tested positive COVID, presentations: poor appetite/limited oral intake, vomited x1, lethargic, afebrile, cough and SOB at his baseline. Denied chest pain.    Gait abnormality, no longer ambulatory, mechanical lift for transfer, w/c for mobility             Mild cognitive impairment, SNF [facility] for supportive care.  Urinary incontinent, no  urinary retention, on Mirabegron, Tamsulosin              Constipation, stable, on MiraLax             OA, uses mechanical lift for transfer, takes prn Tylenol.             Hx of Interstitial lung disease, O2 dependent, no further f/u Pulmonology             CHF/no edema BLE, on Furosamide. Bun/creat 21/0.9 11/29/22 Anxiety/depression, stable, on Lexapro.              Afib, heart rate is in control, on Diltiazem, Eliquis, TSH 3.18 03/10/22             GERD, stable, on Esomeprazole             Anemia, stable, off Fe. Hgb 14 11/29/22             HTN,  on Diltiazem, Furosemide             OSA refused CPAP             Hyperlipidemia, off Atorvastatin, LDL 70 10/22/21                            Past Medical History:  Diagnosis Date   Anxiety    CAD S/P percutaneous coronary angioplasty 1996, 2009, 01/2010   PTCA of L Cx 1996; BMS-RCA [facility]) 2009;  Staged PCI RCA (70% ISR) --> 3.0 mm x 23 mm BMS, staged PCI Diag - 2.0 mm x 12 mm Mini Vision BMS (2.25 mm)   Cataracts, bilateral    immature   Chronic back pain    scoliosis--pt states unable to have surgery bc of age   GERD (gastroesophageal reflux disease)    takes Nexium daily   History of colon polyps    Hyperlipidemia    Hypertension    Myocardial infarction (HCC) 1996, 2009   x 2;last time was about 8-63yrs ago    Obesity (BMI 30.0-34.9)    OSA (obstructive sleep apnea)    Osteoarthritis of both knees    Peripheral edema    Venous Stasis   Persistent atrial fibrillation (HCC)    Unable to maintaine NSR after DCCV with Tikosyn.  Plan = rate control w/diltiazem (beta blocker off b/c fatigue). CHA2DS2Vasc 4. DOAC - Eliquis.    Prostate cancer (HCC) 2005   seed implant   Past Surgical History:  Procedure Laterality Date   CARDIOVERSION N/A 06/12/2015   Procedure: CARDIOVERSION;  Surgeon: Thurmon Fair, MD;  Location: [facility];  Service: Cardiovascular;  Laterality: N/A;   CARDIOVERSION N/A 08/02/2015   Procedure: CARDIOVERSION;   Surgeon: Hillis Range, MD;  Location: [facility] OR;  Service: Cardiovascular;  Laterality: N/A;   CHOLECYSTECTOMY     COLONOSCOPY     CORONARY ANGIOPLASTY WITH STENT PLACEMENT  1996, 2009, August 2011   PTCA of L Cx 1996; BMS-RCA [facility]) 2009; Staged PCI RCA (70% ISR) --> 3.0 mm x 23 mm BMS, staged PCI Diag - 2.0 mm x 12 mm Mini Vision BMS (2.25 mm)   cyst removed  in college   NEUROPLASTY / TRANSPOSITION MEDIAN NERVE AT CARPAL TUNNEL BILATERAL     [facility]  November 2013   EF 53%; fixed mid inferior-inferolateral defect, infarct with no ischemia.   right knee arthrsoscopy  67yrs ago   right trigger finger     seeds placed into prostate   2005   TEE WITHOUT CARDIOVERSION N/A 06/12/2015   Procedure: TRANSESOPHAGEAL ECHOCARDIOGRAM (TEE) - WITH CARDIOVERSION;  Surgeon: Thurmon Fair, MD;  Location: [facility];  Service: Cardiovascular: EF 55-60%.No LA or LAA thrombus. Normal Peal PAP ~33 mmHg.   TOTAL KNEE ARTHROPLASTY  05/09/2012   Procedure: TOTAL KNEE ARTHROPLASTY;  Surgeon: Nestor Lewandowsky, MD;  Location: [facility] OR;  Service: Orthopedics;  Laterality: Right;   TRANSTHORACIC ECHOCARDIOGRAM  06/2015   EF 60-65%. Mild LA dilation. Normal PA pressures: 32 mmHg. Aortic sclerosis but no stenosis.   wisdom teeth extraccted      Allergies  Allergen Reactions   Crab [Shellfish Allergy] Rash   Codeine    Hydrocodone Hives   Adhesive [Tape] Rash   Oxycodone Rash    Allergies as of 01/19/2023       Reactions   Crab [shellfish Allergy] Rash   Codeine    Hydrocodone Hives   Adhesive [tape] Rash   Oxycodone Rash        Medication List        Accurate as of January 19, 2023  4:21 PM. If you have any questions, ask your nurse or doctor.          acetaminophen 325 MG tablet Commonly known as: TYLENOL Take 650 mg by mouth every 6 (six) hours as needed.   diltiazem 240 MG 24 hr capsule Commonly known as: DILACOR XR Take 240 mg by mouth daily. For heart failure  Eliquis 5 MG Tabs  tablet Generic drug: apixaban TAKE 1 TABLET TWICE A DAY (SCHEDULE FOLLOW UP WITH CARDIOLOGIST PRIOR TO NEXT REFILL REQUEST)   escitalopram 10 MG tablet Commonly known as: LEXAPRO Take 10 mg by mouth daily.   esomeprazole 40 MG capsule Commonly known as: NEXIUM TAKE 1 CAPSULE DAILY   furosemide 40 MG tablet Commonly known as: LASIX Take 40 mg by mouth daily.   lactose free nutrition Liqd Take 237 mLs by mouth 2 (two) times daily between meals.   LORazepam 0.5 MG tablet Commonly known as: Ativan Take 1 tablet (0.5 mg total) by mouth 2 (two) times daily as needed for anxiety.   mirabegron ER 25 MG Tb24 tablet Commonly known as: MYRBETRIQ Take 25 mg by mouth daily.   MIRALAX PO Take 17 g by mouth daily.   nitroGLYCERIN 0.4 MG SL tablet Commonly known as: NITROSTAT Place 0.4 mg under the tongue as needed for chest pain.   OXYGEN 2lpm   PreviDent 5000 Booster Plus 1.1 % Pste Generic drug: Sodium Fluoride Place 1 application onto teeth at bedtime.   tamsulosin 0.4 MG Caps capsule Commonly known as: FLOMAX Take 0.4 mg by mouth daily.        Review of Systems  Constitutional:  Positive for appetite change and fatigue. Negative for fever.  HENT:  Positive for hearing loss. Negative for congestion and trouble swallowing.   Eyes:  Negative for visual disturbance.  Respiratory:  Positive for shortness of breath. Negative for cough.        O2 dependent. DOE  Cardiovascular:  Negative for leg swelling.  Gastrointestinal:  Positive for vomiting. Negative for abdominal pain and constipation.       Incontinent of feces.   Genitourinary:  Positive for frequency. Negative for dysuria and urgency.       Incontinent of urine.   Musculoskeletal:  Positive for arthralgias and gait problem.       Left knee pain is chronic.   Skin:  Negative for color change.  Neurological:  Negative for speech difficulty, weakness and headaches.       Memory lapses.   Psychiatric/Behavioral:   Negative for behavioral problems and sleep disturbance. The patient is nervous/anxious.        Anxious at times.     Immunization History  Administered Date(s) Administered   Diphtheria 12/11/2012   H1N1 04/13/2010   Influenza, High Dose Seasonal PF 02/12/2014, 03/29/2018, 01/05/2019, 04/04/2022   Influenza-Unspecified 03/06/2013, 02/12/2014, 03/06/2017, 03/17/2020, 03/24/2021   Moderna SARS-COV2 Booster Vaccination 11/03/2020, 10/22/2021   Moderna Sars-Covid-2 Vaccination 06/08/2019, 07/06/2019, 04/14/2020   PFIZER(Purple Top)SARS-COV-2 Vaccination 02/23/2021   Pfizer Covid-19 Vaccine Bivalent Booster 20yrs & up 10/22/2021, 04/07/2022   Pneumococcal Conjugate-13 02/12/2014   Pneumococcal Polysaccharide-23 02/04/2013, 02/12/2014   Td 09/04/2005   Tdap 12/11/2012   Zoster Recombinant(Shingrix) 08/27/2021, 10/27/2021   Zoster, Live 07/07/2006   Pertinent  Health Maintenance Due  Topic Date Due   INFLUENZA VACCINE  01/05/2023      01/30/2019    5:00 PM 04/01/2022    2:39 PM 06/17/2022    9:34 AM 07/11/2022    4:29 PM  Fall Risk  Falls in the past year?  0 0 0  Was there an injury with Fall?  0 0   Fall Risk Category Calculator  0 0   Fall Risk Category (Retired)  Low Low   (RETIRED) Patient Fall Risk Level Low fall risk Low fall risk Low fall risk   Patient at Risk  for Falls Due to  No Fall Risks No Fall Risks No Fall Risks  Fall risk Follow up  Falls evaluation completed Falls evaluation completed Falls evaluation completed   Functional Status Survey:    Vitals:   01/19/23 1619  BP: (!) 140/60  Pulse: 83  Resp: 20  Temp: (!) 97.5 F (36.4 C)  SpO2: 93%  Weight: 177 lb 8 oz (80.5 kg)   Body mass index is 27.8 kg/m. Physical Exam Vitals and nursing note reviewed.  Constitutional:      Comments: lethargic  HENT:     Head: Normocephalic and atraumatic.  Eyes:     Extraocular Movements: Extraocular movements intact.     Conjunctiva/sclera: Conjunctivae normal.      Pupils: Pupils are equal, round, and reactive to light.  Cardiovascular:     Rate and Rhythm: Normal rate. Rhythm irregular.     Heart sounds: No murmur heard. Pulmonary:     Breath sounds: No wheezing.     Comments: O2 dependent.  Abdominal:     General: Bowel sounds are normal.     Palpations: Abdomen is soft.     Tenderness: There is no abdominal tenderness.  Musculoskeletal:     Cervical back: Normal range of motion and neck supple.     Right lower leg: No edema.     Left lower leg: No edema.     Comments: Mechanical lift for transfer.   Skin:    General: Skin is warm and dry.  Neurological:     General: No focal deficit present.     Mental Status: He is alert and oriented to person, place, and time. Mental status is at baseline.     Gait: Gait abnormal.  Psychiatric:        Mood and Affect: Mood normal.        Behavior: Behavior normal.        Thought Content: Thought content normal.     Labs reviewed: Recent Labs    03/10/22 0000 11/29/22 0000  NA 137 142  K 4.3 4.3  CL 100 104  CO2 25* 28*  BUN 18 21  CREATININE 1.0 0.9  CALCIUM 9.3 8.8   Recent Labs    03/10/22 0000 11/29/22 0000  AST 12* 12*  ALT 3* 5*  ALKPHOS 70 68  ALBUMIN 3.7 3.6   Recent Labs    03/10/22 0000 11/29/22 0000  WBC 8.2 8.7  NEUTROABS 5,707.00  --   HGB 14.6 14.0  HCT 42 40*  PLT 219 219   Lab Results  Component Value Date   TSH 3.18 03/10/2022   No results found for: "HGBA1C" Lab Results  Component Value Date   CHOL 124 10/22/2021   HDL 39 10/22/2021   LDLCALC 70 10/22/2021   TRIG 69 10/22/2021   CHOLHDL 2.9 02/12/2014    Significant Diagnostic Results in last 30 days:  No results found.  Assessment/Plan: COVID-19 virus infection tested positive COVID, presentations: poor appetite/limited oral intake, vomited x1, lethargic, afebrile, cough and SOB at his baseline. Denied chest pain.  Will hold Furosemide x 5 days Paxlovid x5 days, CBC/diff, CMP/eGFR if HPOA  desires, continue supportive care, Hospice service, continue Atropine drops.  HPOA declined labs, Paxlovid, desire comfort measures, prn Lorazepam available to him, tapering off Lexapro 2/2 to lethargy.   Urinary incontinence no urinary retention, on Mirabegron, Tamsulosin   Constipation stable, on MiraLax  Osteoarthritis of right knee uses mechanical lift for transfer, takes prn Tylenol.  ILD (  interstitial lung disease) (HCC) Hx of Interstitial lung disease, O2 dependent, no further f/u Pulmonology  Diastolic CHF (HCC) CHF/no edema BLE, hold Furosamide x 5days 2/2 poor oral intake and no apparent edema. Bun/creat 21/0.9 11/29/22  Anxiety stable, on Lexapro.   Persistent atrial fibrillation (HCC):  CHA2DS2-VASc Score 4; On Eliquis heart rate is in control, on Diltiazem, Eliquis, TSH 3.18 03/10/22  GERD (gastroesophageal reflux disease) stable, on Esomeprazole  Essential hypertension Blood pressure is controlled,   on Diltiazem, Furosemide    Family/ staff Communication: plan of care reviewed with the patient and charge nurse.   Labs/tests ordered:  CBC/diff, CMP/eGFR   Time spend 35 minutes.

## 2023-01-19 NOTE — Assessment & Plan Note (Addendum)
tested positive COVID, presentations: poor appetite/limited oral intake, vomited x1, lethargic, afebrile, cough and SOB at his baseline. Denied chest pain.  Will hold Furosemide x 5 days Paxlovid x5 days, CBC/diff, CMP/eGFR if HPOA desires, continue supportive care, Hospice service, continue Atropine drops.  HPOA declined labs, Paxlovid, desire comfort measures, prn Lorazepam available to him, tapering off Lexapro 2/2 to lethargy.

## 2023-02-20 ENCOUNTER — Non-Acute Institutional Stay (SKILLED_NURSING_FACILITY): Payer: Medicare Other | Admitting: Nurse Practitioner

## 2023-02-20 ENCOUNTER — Encounter: Payer: Self-pay | Admitting: Nurse Practitioner

## 2023-02-20 DIAGNOSIS — I4819 Other persistent atrial fibrillation: Secondary | ICD-10-CM | POA: Diagnosis not present

## 2023-02-20 DIAGNOSIS — K219 Gastro-esophageal reflux disease without esophagitis: Secondary | ICD-10-CM | POA: Diagnosis not present

## 2023-02-20 DIAGNOSIS — D509 Iron deficiency anemia, unspecified: Secondary | ICD-10-CM | POA: Diagnosis not present

## 2023-02-20 DIAGNOSIS — J849 Interstitial pulmonary disease, unspecified: Secondary | ICD-10-CM

## 2023-02-20 DIAGNOSIS — I10 Essential (primary) hypertension: Secondary | ICD-10-CM | POA: Diagnosis not present

## 2023-02-20 DIAGNOSIS — N3942 Incontinence without sensory awareness: Secondary | ICD-10-CM

## 2023-02-20 DIAGNOSIS — K5900 Constipation, unspecified: Secondary | ICD-10-CM

## 2023-02-20 DIAGNOSIS — F419 Anxiety disorder, unspecified: Secondary | ICD-10-CM

## 2023-02-20 DIAGNOSIS — I5030 Unspecified diastolic (congestive) heart failure: Secondary | ICD-10-CM

## 2023-02-20 DIAGNOSIS — M1711 Unilateral primary osteoarthritis, right knee: Secondary | ICD-10-CM

## 2023-02-20 DIAGNOSIS — G3184 Mild cognitive impairment, so stated: Secondary | ICD-10-CM

## 2023-02-20 NOTE — Assessment & Plan Note (Signed)
uses mechanical lift for transfer, takes prn Tylenol.

## 2023-02-20 NOTE — Assessment & Plan Note (Signed)
heart rate is in control, on Diltiazem, Eliquis, TSH 3.18 03/10/22

## 2023-02-20 NOTE — Assessment & Plan Note (Signed)
stable, on MiraLax  ?

## 2023-02-20 NOTE — Assessment & Plan Note (Signed)
CHF/no edema BLE, off Furosamide. Bun/creat 21/0.9 11/29/22

## 2023-02-20 NOTE — Assessment & Plan Note (Signed)
Mild cognitive impairment, SNF [facility] for supportive care.

## 2023-02-20 NOTE — Assessment & Plan Note (Signed)
stable, on Esomeprazole

## 2023-02-20 NOTE — Assessment & Plan Note (Signed)
Hx of Interstitial lung disease, O2 dependent, no further f/u Pulmonology, prn Morphine, Lorazepam, hospice service.

## 2023-02-20 NOTE — Assessment & Plan Note (Signed)
no urinary retention, on Mirabegron, Tamsulosin

## 2023-02-20 NOTE — Progress Notes (Unsigned)
Location:   SNF [facility] Room Number: 5 Place of Service:  SNF (31) Provider: Arna Snipe Nkosi Cortright NP  Ettamae Barkett X, NP  Patient Care Team: Kayler Rise X, NP as PCP - General (Internal Medicine) Marykay Lex, MD as PCP - Cardiology (Cardiology)  Extended Emergency Contact Information Primary Emergency Contact: [facility] Address: [address]          [place], [place] [address] [place] Home Phone: [phone] Mobile Phone: [phone] Relation: Spouse Secondary Emergency Contact: Doneta Public Address: [address]          [place], [place] Home Phone: [phone] Mobile Phone: [phone] Relation: Daughter  Code Status:  DNR Goals of care: Advanced Directive information    01/19/2023    4:29 PM  Advanced Directives  Does Patient Have a Medical Advance Directive? Yes  Type of Advance Directive Living will;Healthcare Power of [place];Out of facility DNR (pink MOST or yellow form)  Does patient want to make changes to medical advance directive? No - Patient declined  Copy of Healthcare Power of Attorney in Chart? Yes - validated most recent copy scanned in chart (See row information)  Pre-existing out of facility DNR order (yellow form or pink MOST form) Yellow form placed in chart (order not valid for inpatient use);Pink MOST form placed in chart (order not valid for inpatient use)     Chief Complaint  Patient presents with   Medical Management of Chronic Issues    HPI:  Pt is a 89 y.o. male seen today for medical management of chronic diseases.   Gait abnormality, no longer ambulatory, mechanical lift for transfer, w/c for mobility             Mild cognitive impairment, SNF [facility] for supportive care.  Urinary incontinent, no urinary retention, on Mirabegron, Tamsulosin              Constipation, stable, on MiraLax             OA, uses mechanical lift for transfer, takes prn Tylenol.             Hx of Interstitial  lung disease, O2 dependent, no further f/u Pulmonology, prn Morphine, Lorazepam             CHF/no edema BLE, off Furosamide. Bun/creat 21/0.9 11/29/22 Anxiety/depression, stable, on Lexapro, prn Lorazepam             Afib, heart rate is in control, on Diltiazem, Eliquis, TSH 3.18 03/10/22             GERD, stable, on Esomeprazole             Anemia, stable, off Fe. Hgb 14 11/29/22             HTN,  on Diltiazem, Furosemide             OSA refused CPAP             Hyperlipidemia, off Atorvastatin, LDL 70 10/22/21               Past Medical History:  Diagnosis Date   Anxiety    CAD S/P percutaneous coronary angioplasty 1996, 2009, 01/2010   PTCA of L Cx 1996; BMS-RCA [facility]) 2009; Staged PCI RCA (70% ISR) --> 3.0 mm x 23 mm BMS, staged PCI Diag - 2.0 mm x 12 mm Mini Vision BMS (2.25 mm)   Cataracts, bilateral    immature   Chronic back pain  scoliosis--pt states unable to have surgery bc of age   GERD (gastroesophageal reflux disease)    takes Nexium daily   History of colon polyps    Hyperlipidemia    Hypertension    Myocardial infarction (HCC) 1996, 2009   x 2;last time was about 8-78yrs ago    Obesity (BMI 30.0-34.9)    OSA (obstructive sleep apnea)    Osteoarthritis of both knees    Peripheral edema    Venous Stasis   Persistent atrial fibrillation (HCC)    Unable to maintaine NSR after DCCV with Tikosyn.  Plan = rate control w/diltiazem (beta blocker off b/c fatigue). CHA2DS2Vasc 4. DOAC - Eliquis.    Prostate cancer (HCC) 2005   seed implant   Past Surgical History:  Procedure Laterality Date   CARDIOVERSION N/A 06/12/2015   Procedure: CARDIOVERSION;  Surgeon: Thurmon Fair, MD;  Location: [facility] END[facility];  Service: Cardiovascular;  Laterality: N/A;   CARDIOVERSION N/A 08/02/2015   Procedure: CARDIOVERSION;  Surgeon: Hillis Range, MD;  [facility];  Service: Cardiovascular;  Laterality: N/A;   CHOLECYSTECTOMY     COLONOSCOPY     CORONARY ANGIOPLASTY WITH STENT PLACEMENT   1996, 2009, August 2011   PTCA of L Cx 1996; BMS-RCA [facility]) 2009; Staged PCI RCA (70% ISR) --> 3.0 mm x 23 mm BMS, staged PCI Diag - 2.0 mm x 12 mm Mini Vision BMS (2.25 mm)   cyst removed  in college   NEUROPLASTY / TRANSPOSITION MEDIAN NERVE AT CARPAL TUNNEL BILATERAL     [facility] MYOVIEW LTD  November 2013   EF 53%; fixed mid inferior-inferolateral defect, infarct with no ischemia.   right knee arthrsoscopy  59yrs ago   right trigger finger     seeds placed into prostate   2005   TEE WITHOUT CARDIOVERSION N/A 06/12/2015   Procedure: TRANSESOPHAGEAL ECHOCARDIOGRAM (TEE) - WITH CARDIOVERSION;  Surgeon: Thurmon Fair, MD;  Location: [facility];  Service: Cardiovascular: EF 55-60%.No LA or LAA thrombus. Normal Peal PAP ~33 mmHg.   TOTAL KNEE ARTHROPLASTY  05/09/2012   Procedure: TOTAL KNEE ARTHROPLASTY;  Surgeon: Nestor Lewandowsky, MD;  Location: [facility] OR;  Service: Orthopedics;  Laterality: Right;   TRANSTHORACIC ECHOCARDIOGRAM  06/2015   EF 60-65%. Mild LA dilation. Normal PA pressures: 32 mmHg. Aortic sclerosis but no stenosis.   wisdom teeth extraccted      Allergies  Allergen Reactions   Crab [Shellfish Allergy] Rash   Codeine    Hydrocodone Hives   Adhesive [Tape] Rash   Oxycodone Rash    Allergies as of 02/20/2023       Reactions   Crab [shellfish Allergy] Rash   Codeine    Hydrocodone Hives   Adhesive [tape] Rash   Oxycodone Rash        Medication List        Accurate as of February 20, 2023 11:59 PM. If you have any questions, ask your nurse or doctor.          acetaminophen 325 MG tablet Commonly known as: TYLENOL Take 650 mg by mouth every 6 (six) hours as needed.   diltiazem 240 MG 24 hr capsule Commonly known as: DILACOR XR Take 240 mg by mouth daily. For heart failure   Eliquis 5 MG Tabs tablet Generic drug: apixaban TAKE 1 TABLET TWICE A DAY (SCHEDULE FOLLOW UP WITH CARDIOLOGIST PRIOR TO NEXT REFILL REQUEST)   escitalopram 10 MG tablet Commonly known  as: LEXAPRO Take 10 mg by mouth daily.  esomeprazole 40 MG capsule Commonly known as: NEXIUM TAKE 1 CAPSULE DAILY   furosemide 40 MG tablet Commonly known as: LASIX Take 40 mg by mouth daily.   lactose free nutrition Liqd Take 237 mLs by mouth 2 (two) times daily between meals.   LORazepam 0.5 MG tablet Commonly known as: Ativan Take 1 tablet (0.5 mg total) by mouth 2 (two) times daily as needed for anxiety.   mirabegron ER 25 MG Tb24 tablet Commonly known as: MYRBETRIQ Take 25 mg by mouth daily.   MIRALAX PO Take 17 g by mouth daily.   nitroGLYCERIN 0.4 MG SL tablet Commonly known as: NITROSTAT Place 0.4 mg under the tongue as needed for chest pain.   OXYGEN 2lpm   PreviDent 5000 Booster Plus 1.1 % Pste Generic drug: Sodium Fluoride Place 1 application onto teeth at bedtime.   tamsulosin 0.4 MG Caps capsule Commonly known as: FLOMAX Take 0.4 mg by mouth daily.        Review of Systems  Constitutional:  Positive for appetite change and fatigue. Negative for fever.  HENT:  Positive for hearing loss. Negative for congestion and trouble swallowing.   Eyes:  Negative for visual disturbance.  Respiratory:  Positive for shortness of breath. Negative for cough.        O2 dependent. DOE  Cardiovascular:  Negative for leg swelling.  Gastrointestinal:  Negative for abdominal pain and constipation.       Incontinent of feces.   Genitourinary:  Positive for frequency. Negative for dysuria and urgency.       Incontinent of urine.   Musculoskeletal:  Positive for arthralgias and gait problem.       Left knee pain is chronic.   Skin:  Negative for color change.  Neurological:  Negative for speech difficulty, weakness and headaches.       Memory lapses.   Psychiatric/Behavioral:  Negative for behavioral problems and sleep disturbance. The patient is nervous/anxious.        Anxious at times.     Immunization History  Administered Date(s) Administered   Diphtheria  12/11/2012   H1N1 04/13/2010   Influenza, High Dose Seasonal PF 02/12/2014, 03/29/2018, 01/05/2019, 04/04/2022   Influenza-Unspecified 03/06/2013, 02/12/2014, 03/06/2017, 03/17/2020, 03/24/2021   Moderna SARS-COV2 Booster Vaccination 11/03/2020, 10/22/2021   Moderna Sars-Covid-2 Vaccination 06/08/2019, 07/06/2019, 04/14/2020   PFIZER(Purple Top)SARS-COV-2 Vaccination 02/23/2021   Pfizer Covid-19 Vaccine Bivalent Booster 74yrs & up 10/22/2021, 04/07/2022   Pneumococcal Conjugate-13 02/12/2014   Pneumococcal Polysaccharide-23 02/04/2013, 02/12/2014   Td 09/04/2005   Tdap 12/11/2012   Zoster Recombinant(Shingrix) 08/27/2021, 10/27/2021   Zoster, Live 07/07/2006   Pertinent  Health Maintenance Due  Topic Date Due   INFLUENZA VACCINE  01/05/2023      01/30/2019    5:00 PM 04/01/2022    2:39 PM 06/17/2022    9:34 AM 07/11/2022    4:29 PM 01/19/2023    4:29 PM  Fall Risk  Falls in the past year?  0 0 0 0  Was there an injury with Fall?  0 0  0  Fall Risk Category Calculator  0 0  0  Fall Risk Category (Retired)  Low Low    (RETIRED) Patient Fall Risk Level Low fall risk Low fall risk Low fall risk    Patient at Risk for Falls Due to  No Fall Risks No Fall Risks No Fall Risks No Fall Risks  Fall risk Follow up  Falls evaluation completed Falls evaluation completed Falls evaluation completed Falls evaluation completed  Functional Status Survey:    Vitals:   02/20/23 1023  BP: 103/61  Pulse: 84  Resp: 17  Temp: 98.1 F (36.7 C)  SpO2: 90%  Weight: 164 lb 8 oz (74.6 kg)   Body mass index is 25.76 kg/m. Physical Exam Vitals and nursing note reviewed.  Constitutional:      Comments: fatigue  HENT:     Head: Normocephalic and atraumatic.     Nose: Nose normal.  Eyes:     Extraocular Movements: Extraocular movements intact.     Conjunctiva/sclera: Conjunctivae normal.     Pupils: Pupils are equal, round, and reactive to light.  Cardiovascular:     Rate and Rhythm: Normal  rate. Rhythm irregular.     Heart sounds: No murmur heard. Pulmonary:     Breath sounds: No wheezing.     Comments: O2 dependent.  Abdominal:     General: Bowel sounds are normal.     Palpations: Abdomen is soft.     Tenderness: There is no abdominal tenderness.  Musculoskeletal:     Cervical back: Normal range of motion and neck supple.     Right lower leg: No edema.     Left lower leg: No edema.     Comments: Mechanical lift for transfer.   Skin:    General: Skin is warm and dry.  Neurological:     General: No focal deficit present.     Mental Status: He is alert and oriented to person, place, and time. Mental status is at baseline.     Gait: Gait abnormal.  Psychiatric:        Mood and Affect: Mood normal.        Behavior: Behavior normal.        Thought Content: Thought content normal.     Labs reviewed: Recent Labs    03/10/22 0000 11/29/22 0000  NA 137 142  K 4.3 4.3  CL 100 104  CO2 25* 28*  BUN 18 21  CREATININE 1.0 0.9  CALCIUM 9.3 8.8   Recent Labs    03/10/22 0000 11/29/22 0000  AST 12* 12*  ALT 3* 5*  ALKPHOS 70 68  ALBUMIN 3.7 3.6   Recent Labs    03/10/22 0000 11/29/22 0000  WBC 8.2 8.7  NEUTROABS 5,707.00  --   HGB 14.6 14.0  HCT 42 40*  PLT 219 219   Lab Results  Component Value Date   TSH 3.18 03/10/2022   No results found for: "HGBA1C" Lab Results  Component Value Date   CHOL 124 10/22/2021   HDL 39 10/22/2021   LDLCALC 70 10/22/2021   TRIG 69 10/22/2021   CHOLHDL 2.9 02/12/2014    Significant Diagnostic Results in last 30 days:  No results found.  Assessment/Plan  Persistent atrial fibrillation (HCC):  CHA2DS2-VASc Score 4; On Eliquis  heart rate is in control, on Diltiazem, Eliquis, TSH 3.18 03/10/22  GERD (gastroesophageal reflux disease) stable, on Esomeprazole  Iron deficiency anemia stable, off Fe. Hgb 14 11/29/22  Essential hypertension Blood pressure is controlled,  on Diltiazem,  Furosemide  Anxiety stable, on Lexapro, prn Lorazepam  Diastolic CHF (HCC) CHF/no edema BLE, off Furosamide. Bun/creat 21/0.9 11/29/22  ILD (interstitial lung disease) (HCC) Hx of Interstitial lung disease, O2 dependent, no further f/u Pulmonology, prn Morphine, Lorazepam, hospice service.   Osteoarthritis of right knee uses mechanical lift for transfer, takes prn Tylenol  Constipation stable, on MiraLax  Urinary incontinence  no urinary retention, on Mirabegron, Tamsulosin  MCI (mild cognitive impairment) with memory loss Mild cognitive impairment, SNF [facility] for supportive care.    Family/ staff Communication: plan of care reviewed with the patient and charge nurse.   Labs/tests ordered:  none  Time spend 35 minutes.

## 2023-02-20 NOTE — Assessment & Plan Note (Signed)
Blood pressure is controlled,  on Diltiazem, Furosemide

## 2023-02-20 NOTE — Assessment & Plan Note (Signed)
stable, on Lexapro, prn Lorazepam

## 2023-02-20 NOTE — Assessment & Plan Note (Signed)
stable, off Fe. Hgb 14 11/29/22

## 2023-02-21 ENCOUNTER — Encounter: Payer: Self-pay | Admitting: Nurse Practitioner

## 2023-03-10 ENCOUNTER — Encounter: Payer: Self-pay | Admitting: Sports Medicine

## 2023-03-10 ENCOUNTER — Non-Acute Institutional Stay (SKILLED_NURSING_FACILITY): Payer: Medicare Other | Admitting: Sports Medicine

## 2023-03-10 DIAGNOSIS — F419 Anxiety disorder, unspecified: Secondary | ICD-10-CM

## 2023-03-10 DIAGNOSIS — R32 Unspecified urinary incontinence: Secondary | ICD-10-CM

## 2023-03-10 DIAGNOSIS — I4819 Other persistent atrial fibrillation: Secondary | ICD-10-CM | POA: Diagnosis not present

## 2023-03-10 DIAGNOSIS — K219 Gastro-esophageal reflux disease without esophagitis: Secondary | ICD-10-CM

## 2023-03-10 DIAGNOSIS — I5030 Unspecified diastolic (congestive) heart failure: Secondary | ICD-10-CM

## 2023-03-10 DIAGNOSIS — J849 Interstitial pulmonary disease, unspecified: Secondary | ICD-10-CM

## 2023-03-10 DIAGNOSIS — Z515 Encounter for palliative care: Secondary | ICD-10-CM

## 2023-03-10 DIAGNOSIS — K5900 Constipation, unspecified: Secondary | ICD-10-CM

## 2023-03-10 NOTE — Progress Notes (Unsigned)
Provider:  Jacalyn Lefevre, MD Location:      Place of Service:     PCP: Mast, Man X, NP Patient Care Team: Mast, Man X, NP as PCP - General (Internal Medicine) Marykay Lex, MD as PCP - Cardiology (Cardiology)  Extended Emergency Contact Information Primary Emergency Contact: [facility] Address: [address]          [place], [place] [address] [place] Home Phone: [phone] Mobile Phone: [phone] Relation: Spouse Secondary Emergency Contact: Doneta Public Address: [address]          [place], [place] Home Phone: [phone] Mobile Phone: [phone] Relation: Daughter  Code Status:  Goals of Care: Advanced Directive information    01/19/2023    4:29 PM  Advanced Directives  Does Patient Have a Medical Advance Directive? Yes  Type of Advance Directive Living will;Healthcare Power of [place];Out of facility DNR (pink MOST or yellow form)  Does patient want to make changes to medical advance directive? No - Patient declined  Copy of Healthcare Power of Attorney in Chart? Yes - validated most recent copy scanned in chart (See row information)  Pre-existing out of facility DNR order (yellow form or pink MOST form) Yellow form placed in chart (order not valid for inpatient use);Pink MOST form placed in chart (order not valid for inpatient use)      No chief complaint on file.   HPI: Patient is a 89 y.o. male seen today for  routine visit  As per nursing staff  pt c/o pain in his lower with movements  Seen and examined in his room, he is watching TV  States that he like to watch news Does not seems to be in pain  He has food tray and ate very little Denies sob, chest pain, abdominal pain  Staff reported that at times he screams  but  required lorazepam only couple of times within the last month   Past Medical History:  Diagnosis Date   Anxiety    CAD S/P percutaneous coronary angioplasty 1996, 2009, 01/2010    PTCA of L Cx 1996; BMS-RCA [facility]) 2009; Staged PCI RCA (70% ISR) --> 3.0 mm x 23 mm BMS, staged PCI Diag - 2.0 mm x 12 mm Mini Vision BMS (2.25 mm)   Cataracts, bilateral    immature   Chronic back pain    scoliosis--pt states unable to have surgery bc of age   GERD (gastroesophageal reflux disease)    takes Nexium daily   History of colon polyps    Hyperlipidemia    Hypertension    Myocardial infarction (HCC) 1996, 2009   x 2;last time was about 8-110yrs ago    Obesity (BMI 30.0-34.9)    OSA (obstructive sleep apnea)    Osteoarthritis of both knees    Peripheral edema    Venous Stasis   Persistent atrial fibrillation (HCC)    Unable to maintaine NSR after DCCV with Tikosyn.  Plan = rate control w/diltiazem (beta blocker off b/c fatigue). CHA2DS2Vasc 4. DOAC - Eliquis.    Prostate cancer (HCC) 2005   seed implant   Past Surgical History:  Procedure Laterality Date   CARDIOVERSION N/A 06/12/2015   Procedure: CARDIOVERSION;  Surgeon: Thurmon Fair, MD;  Location: [facility] ENDOSCOPY;  Service: Cardiovascular;  Laterality: N/A;   CARDIOVERSION N/A 08/02/2015   Procedure: CARDIOVERSION;  Surgeon: Hillis Range, MD;  Location: [facility] OR;  Service: Cardiovascular;  Laterality: N/A;   CHOLECYSTECTOMY  COLONOSCOPY     CORONARY ANGIOPLASTY WITH STENT PLACEMENT  1996, 2009, August 2011   PTCA of L Cx 1996; BMS-RCA [facility]) 2009; Staged PCI RCA (70% ISR) --> 3.0 mm x 23 mm BMS, staged PCI Diag - 2.0 mm x 12 mm Mini Vision BMS (2.25 mm)   cyst removed  in college   NEUROPLASTY / TRANSPOSITION MEDIAN NERVE AT CARPAL TUNNEL BILATERAL     [facility] MYOVIEW LTD  November 2013   EF 53%; fixed mid inferior-inferolateral defect, infarct with no ischemia.   right knee arthrsoscopy  35yrs ago   right trigger finger     seeds placed into prostate   2005   TEE WITHOUT CARDIOVERSION N/A 06/12/2015   Procedure: TRANSESOPHAGEAL ECHOCARDIOGRAM (TEE) - WITH CARDIOVERSION;  Surgeon: Thurmon Fair, MD;  Location: [facility]  ENDOSCOPY;  Service: Cardiovascular: EF 55-60%.No LA or LAA thrombus. Normal Peal PAP ~33 mmHg.   TOTAL KNEE ARTHROPLASTY  05/09/2012   Procedure: TOTAL KNEE ARTHROPLASTY;  Surgeon: Nestor Lewandowsky, MD;  Location: [facility] OR;  Service: Orthopedics;  Laterality: Right;   TRANSTHORACIC ECHOCARDIOGRAM  06/2015   EF 60-65%. Mild LA dilation. Normal PA pressures: 32 mmHg. Aortic sclerosis but no stenosis.   wisdom teeth extraccted      reports that he quit smoking about 39 years ago. His smoking use included pipe. He has never used smokeless tobacco. He reports current alcohol use. He reports that he does not use drugs. Social History   Socioeconomic History   Marital status: Married    Spouse name: Not on file   Number of children: Not on file   Years of education: Not on file   Highest education level: Not on file  Occupational History   Occupation: Retired  Tobacco Use   Smoking status: Former    Types: Pipe    Quit date: 03/12/1984    Years since quitting: 39.0   Smokeless tobacco: Never   Tobacco comments:    quit smoking pipe about 13yrs ago  Vaping Use   Vaping status: Never Used  Substance and Sexual Activity   Alcohol use: Yes    Comment: glass of wine daily   Drug use: No   Sexual activity: Not Currently  Other Topics Concern   Not on file  Social History Narrative   He is a WIDOWED father of 2, grandfather of 2.   hE LIVES @ [facility] (Independent / Assisted Living)   He does not smoke. Drinks occasional alcohol.    Social Determinants of Health   Financial Resource Strain: Not on file  Food Insecurity: Not on file  Transportation Needs: Not on file  Physical Activity: Not on file  Stress: Not on file  Social Connections: Not on file  Intimate Partner Violence: Not on file    Functional Status Survey:    Family History  Problem Relation Age of Onset   Cancer Mother 76       abdomen died in 39s   Heart attack Father 29       died in 86s    Health  Maintenance  Topic Date Due   DTaP/Tdap/Td (3 - Td or Tdap) 12/12/2022   INFLUENZA VACCINE  01/05/2023   COVID-19 Vaccine (9 - 2023-24 season) 02/05/2023   Pneumonia Vaccine 27+ Years old  Completed   Zoster Vaccines- Shingrix  Completed   HPV VACCINES  Aged Out    Allergies  Allergen Reactions   Crab [Shellfish Allergy] Rash   Codeine  Hydrocodone Hives   Adhesive [Tape] Rash   Oxycodone Rash    Outpatient Encounter Medications as of 03/10/2023  Medication Sig   acetaminophen (TYLENOL) 325 MG tablet Take 650 mg by mouth every 6 (six) hours as needed.   diltiazem (DILACOR XR) 240 MG 24 hr capsule Take 240 mg by mouth daily. For heart failure   ELIQUIS 5 MG TABS tablet TAKE 1 TABLET TWICE A DAY (SCHEDULE FOLLOW UP WITH CARDIOLOGIST PRIOR TO NEXT REFILL REQUEST)   escitalopram (LEXAPRO) 10 MG tablet Take 10 mg by mouth daily.   esomeprazole (NEXIUM) 40 MG capsule TAKE 1 CAPSULE DAILY   furosemide (LASIX) 40 MG tablet Take 40 mg by mouth daily.   lactose free nutrition (BOOST) LIQD Take 237 mLs by mouth 2 (two) times daily between meals.   LORazepam (ATIVAN) 0.5 MG tablet Take 1 tablet (0.5 mg total) by mouth 2 (two) times daily as needed for anxiety.   mirabegron ER (MYRBETRIQ) 25 MG TB24 tablet Take 25 mg by mouth daily.    nitroGLYCERIN (NITROSTAT) 0.4 MG SL tablet Place 0.4 mg under the tongue as needed for chest pain.   OXYGEN 2lpm   Polyethylene Glycol 3350 (MIRALAX PO) Take 17 g by mouth daily.   Sodium Fluoride (PREVIDENT 5000 BOOSTER PLUS) 1.1 % PSTE Place 1 application onto teeth at bedtime.   tamsulosin (FLOMAX) 0.4 MG CAPS capsule Take 0.4 mg by mouth daily.    No facility-administered encounter medications on file as of 03/10/2023.    Review of Systems  There were no vitals filed for this visit. There is no height or weight on file to calculate BMI. Physical Exam Cardiovascular:     Rate and Rhythm: Normal rate and regular rhythm.  Pulmonary:     Effort: No  respiratory distress.     Breath sounds: No wheezing or rales.  Musculoskeletal:        General: No swelling.  Neurological:     Comments: At baseline Able to move his legs      Labs reviewed: Basic Metabolic Panel: Recent Labs    11/29/22 0000  NA 142  K 4.3  CL 104  CO2 28*  BUN 21  CREATININE 0.9  CALCIUM 8.8   Liver Function Tests: Recent Labs    11/29/22 0000  AST 12*  ALT 5*  ALKPHOS 68  ALBUMIN 3.6   No results for input(s): "LIPASE", "AMYLASE" in the last 8760 hours. No results for input(s): "AMMONIA" in the last 8760 hours. CBC: Recent Labs    11/29/22 0000  WBC 8.7  HGB 14.0  HCT 40*  PLT 219   Cardiac Enzymes: No results for input(s): "CKTOTAL", "CKMB", "CKMBINDEX", "TROPONINI" in the last 8760 hours. BNP: Invalid input(s): "POCBNP" No results found for: "HGBA1C" Lab Results  Component Value Date   TSH 3.18 03/10/2022   Lab Results  Component Value Date   VITAMINB12 335 04/03/2019   Lab Results  Component Value Date   FOLATE 13.7 04/03/2019   Lab Results  Component Value Date   IRON 30 04/03/2019   FERRITIN 32 04/03/2019    Imaging and Procedures obtained prior to SNF admission: CT Chest W Contrast  Result Date: 03/27/2019 CLINICAL DATA:  Follow-up lung nodule. EXAM: CT CHEST WITH CONTRAST TECHNIQUE: Multidetector CT imaging of the chest was performed during intravenous contrast administration. CONTRAST:  80mL OMNIPAQUE IOHEXOL 300 MG/ML  SOLN COMPARISON:  11/23/2018 FINDINGS: Cardiovascular: Normal heart size. No pericardial effusion. 4 cm right ventricular outflow track  is identified. Findings may reflect PA hypertension. Aortic atherosclerosis. Lad, left circumflex and RCA coronary artery calcifications. Mediastinum/Nodes: Normal appearance of the thyroid gland. The trachea appears patent and is midline. Normal appearance of the esophagus. Prominent mediastinal lymph nodes are identified including 1.6 cm right paratracheal lymph  node, image 52/2. Previously this measured the same. 1.5 cm subcarinal lymph node is identified, image 72/2. Previously 1.3 cm. Enlarged right hilar node measures 2 cm, image 64/2. This is difficult to compare with the previous exam as the prior study was performed without IV contrast material. Lungs/Pleura: No pleural effusion identified. Diffuse bilateral interstitial reticulation is identified which has a lower lung zone predominance. Mild bronchiectasis. A mosaic attenuation pattern is noted bilaterally. Right middle lobe lung nodule is unchanged measuring 4 mm, image 93/3. right upper lobe lung nodule measuring 4 mm is stable from previous exam, image 46/3. Upper Abdomen: Unchanged 1.5 cm low-density left adrenal nodule. Previous cholecystectomy. Left upper pole kidney cysts noted. Musculoskeletal: Spondylosis within the thoracic spine. Unchanged lower thoracic spine compression fracture IMPRESSION: 1. Unchanged right upper lobe and right middle lobe lung nodules. No new or enlarging pulmonary nodules identified. No follow-up needed if patient is low-risk (and has no known or suspected primary neoplasm). Non-contrast chest CT can be considered at 12 months (from 11/23/2018) if patient is high-risk. This recommendation follows the consensus statement: Guidelines for Management of Incidental Pulmonary Nodules Detected on CT Images: From the Fleischner Society 2017; Radiology 2017; 284:228-243. 2. Chronic interstitial lung disease with lower lung zone predominance and mild bronchiectasis. As mentioned previously findings may reflect nonspecific interstitial pneumonitis or early usual interstitial pneumonitis. 3. Multi vessel coronary artery calcifications noted. 4. Stable enlarged mediastinal and right hilar lymph nodes. 5. Stable left adrenal nodule. 6. Stable lower thoracic spine compression fracture. Enlargement of the right ventricular outflow tract may reflect underlying PA hypertension. 7. Unchanged lower  thoracic spine compression fracture. Aortic Atherosclerosis (ICD10-I70.0). Electronically Signed   By: Signa Kell M.D.   On: 03/27/2019 15:45    Assessment/Plan There are no diagnoses linked to this encounter.   Family/ staff Communication:   Labs/tests ordered:  .smmsig

## 2023-03-13 ENCOUNTER — Encounter: Payer: Self-pay | Admitting: Sports Medicine

## 2023-03-31 ENCOUNTER — Encounter: Payer: Self-pay | Admitting: Nurse Practitioner

## 2023-03-31 ENCOUNTER — Non-Acute Institutional Stay (INDEPENDENT_AMBULATORY_CARE_PROVIDER_SITE_OTHER): Admitting: Nurse Practitioner

## 2023-03-31 DIAGNOSIS — Z0000 Encounter for general adult medical examination without abnormal findings: Secondary | ICD-10-CM | POA: Diagnosis not present

## 2023-03-31 NOTE — Progress Notes (Unsigned)
Subjective:   Jerry [name] is a 89 y.o. male who presents for Medicare Annual/Subsequent preventive examination.  Visit Complete: In person  Patient Medicare AWV questionnaire was completed by the patient on 03/31/23; I have confirmed that all information answered by patient is correct and no changes since this date.  Cardiac Risk Factors include: advanced age (>28men, >39 women);hypertension;male gender;sedentary lifestyle     Objective:    Today's Vitals   03/31/23 1332  BP: 138/74  Pulse: 85  Resp: 20  Temp: 98.1 F (36.7 C)  SpO2: 91%  Weight: 164 lb 1.6 oz (74.4 kg)   Body mass index is 25.7 kg/m.     03/10/2023    2:56 PM 01/19/2023    4:29 PM 01/17/2023    9:27 AM 12/13/2022    2:17 PM 10/07/2022   10:15 AM 08/30/2022   12:26 PM 07/11/2022    4:29 PM  Advanced Directives  Does Patient Have a Medical Advance Directive? Yes Yes Yes Yes Yes Yes Yes  Type of Advance Directive Living will;Healthcare Power of Attorney;Out of facility DNR (pink MOST or yellow form) Living will;Healthcare Power of [place];Out of facility DNR (pink MOST or yellow form) Living will;Healthcare Power of [place];Out of facility DNR (pink MOST or yellow form) Living will;Healthcare Power of [place];Out of facility DNR (pink MOST or yellow form) Healthcare Power of [place];Living will;Out of facility DNR (pink MOST or yellow form) Healthcare Power of [place];Out of facility DNR (pink MOST or yellow form);Living will Living will;Out of facility DNR (pink MOST or yellow form)  Does patient want to make changes to medical advance directive? No - Patient declined No - Patient declined No - Patient declined No - Patient declined No - Patient declined No - Patient declined No - Patient declined  Copy of Healthcare Power of Attorney in Chart? Yes - validated most recent copy scanned in chart (See row information) Yes - validated most recent copy scanned in chart (See row information) Yes - validated  most recent copy scanned in chart (See row information) Yes - validated most recent copy scanned in chart (See row information) Yes - validated most recent copy scanned in chart (See row information) Yes - validated most recent copy scanned in chart (See row information)   Pre-existing out of facility DNR order (yellow form or pink MOST form) Yellow form placed in chart (order not valid for inpatient use);Pink MOST form placed in chart (order not valid for inpatient use) Yellow form placed in chart (order not valid for inpatient use);Pink MOST form placed in chart (order not valid for inpatient use) Yellow form placed in chart (order not valid for inpatient use);Pink MOST form placed in chart (order not valid for inpatient use) Yellow form placed in chart (order not valid for inpatient use);Pink MOST form placed in chart (order not valid for inpatient use) Pink MOST/Yellow Form most recent copy in chart - Physician notified to receive inpatient order      Current Medications (verified) Outpatient Encounter Medications as of 03/31/2023  Medication Sig   acetaminophen (TYLENOL) 325 MG tablet Take 650 mg by mouth every 6 (six) hours as needed.   artificial tears (LACRILUBE) OINT ophthalmic ointment Place 2 Applications into both eyes every 30 (thirty) minutes as needed for dry eyes.   diltiazem (DILACOR XR) 240 MG 24 hr capsule Take 240 mg by mouth daily. For heart failure   ELIQUIS 5 MG TABS tablet TAKE 1 TABLET TWICE A DAY (SCHEDULE FOLLOW UP WITH  CARDIOLOGIST PRIOR TO NEXT REFILL REQUEST)   escitalopram (LEXAPRO) 10 MG tablet Take 10 mg by mouth daily.   esomeprazole (NEXIUM) 40 MG capsule TAKE 1 CAPSULE DAILY   furosemide (LASIX) 40 MG tablet Take 40 mg by mouth daily.   lactose free nutrition (BOOST) LIQD Take 237 mLs by mouth 2 (two) times daily between meals.   LORazepam (ATIVAN) 0.5 MG tablet Take 1 tablet (0.5 mg total) by mouth 2 (two) times daily as needed for anxiety.   mirabegron ER  (MYRBETRIQ) 25 MG TB24 tablet Take 25 mg by mouth daily.    nitroGLYCERIN (NITROSTAT) 0.4 MG SL tablet Place 0.4 mg under the tongue as needed for chest pain.   ONDANSETRON HCL PO Take 2 mg by mouth every 6 (six) hours as needed for nausea or vomiting.   OXYGEN 2lpm   Polyethylene Glycol 3350 (MIRALAX PO) Take 17 g by mouth daily.   Sodium Fluoride (PREVIDENT 5000 BOOSTER PLUS) 1.1 % PSTE Place 1 application onto teeth at bedtime.   tamsulosin (FLOMAX) 0.4 MG CAPS capsule Take 0.4 mg by mouth daily.    No facility-administered encounter medications on file as of 03/31/2023.    Allergies (verified) Crab [shellfish allergy], Codeine, Hydrocodone, Adhesive [tape], and Oxycodone   History: Past Medical History:  Diagnosis Date   Anxiety    CAD S/P percutaneous coronary angioplasty 1996, 2009, 01/2010   PTCA of L Cx 1996; BMS-RCA [facility]) 2009; Staged PCI RCA (70% ISR) --> 3.0 mm x 23 mm BMS, staged PCI Diag - 2.0 mm x 12 mm Mini Vision BMS (2.25 mm)   Cataracts, bilateral    immature   Chronic back pain    scoliosis--pt states unable to have surgery bc of age   GERD (gastroesophageal reflux disease)    takes Nexium daily   History of colon polyps    Hyperlipidemia    Hypertension    Myocardial infarction (HCC) 1996, 2009   x 2;last time was about 8-23yrs ago    Obesity (BMI 30.0-34.9)    OSA (obstructive sleep apnea)    Osteoarthritis of both knees    Peripheral edema    Venous Stasis   Persistent atrial fibrillation (HCC)    Unable to maintaine NSR after DCCV with Tikosyn.  Plan = rate control w/diltiazem (beta blocker off b/c fatigue). CHA2DS2Vasc 4. DOAC - Eliquis.    Prostate cancer (HCC) 2005   seed implant   Past Surgical History:  Procedure Laterality Date   CARDIOVERSION N/A 06/12/2015   Procedure: CARDIOVERSION;  Surgeon: Thurmon Fair, MD;  Location: [facility] ENDOSCOPY;  Service: Cardiovascular;  Laterality: N/A;   CARDIOVERSION N/A 08/02/2015   Procedure: CARDIOVERSION;   Surgeon: Hillis Range, MD;  Location[facility] OR;  Service: Cardiovascular;  Laterality: N/A;   CHOLECYSTECTOMY     COLONOSCOPY     CORONARY ANGIOPLASTY WITH STENT PLACEMENT  1996, 2009, August 2011   PTCA of L Cx 1996; BMS-RCA [facility]) 2009; Staged PCI RCA (70% ISR) --> 3.0 mm x 23 mm BMS, staged PCI Diag - 2.0 mm x 12 mm Mini Vision BMS (2.25 mm)   cyst removed  in college   NEUROPLASTY / TRANSPOSITION MEDIAN NERVE AT CARPAL TUNNEL BILATERAL     [facility] MYOVIEW LTD  November 2013   EF 53%; fixed mid inferior-inferolateral defect, infarct with no ischemia.   right knee arthrsoscopy  1yrs ago   right trigger finger     seeds placed into prostate   2005   TEE WITHOUT  CARDIOVERSION N/A 06/12/2015   Procedure: TRANSESOPHAGEAL ECHOCARDIOGRAM (TEE) - WITH CARDIOVERSION;  Surgeon: Thurmon Fair, MD;  Location: [facility] ENDOS[facility];  Service: Cardiovascular: EF 55-60%.No LA or LAA thrombus. Normal Peal PAP ~33 mmHg.   TOTAL KNEE ARTHROPLASTY  05/09/2012   Procedure: TOTAL KNEE ARTHROPLASTY;  Surgeon: Nestor Lewandowsky, MD;  Location: [facility] OR;  Service: Orthopedics;  Laterality: Right;   TRANSTHORACIC ECHOCARDIOGRAM  06/2015   EF 60-65%. Mild LA dilation. Normal PA pressures: 32 mmHg. Aortic sclerosis but no stenosis.   wisdom teeth extraccted     Family History  Problem Relation Age of Onset   Cancer Mother 66       abdomen died in 6s   Heart attack Father 60       died in 66s   Social History   Socioeconomic History   Marital status: Married    Spouse name: Not on file   Number of children: Not on file   Years of education: Not on file   Highest education level: Not on file  Occupational History   Occupation: Retired  Tobacco Use   Smoking status: Former    Types: Pipe    Quit date: 03/12/1984    Years since quitting: 39.0   Smokeless tobacco: Never   Tobacco comments:    quit smoking pipe about 51yrs ago  Vaping Use   Vaping status: Never Used  Substance and Sexual Activity   Alcohol use: Yes     Comment: glass of wine daily   Drug use: No   Sexual activity: Not Currently  Other Topics Concern   Not on file  Social History Narrative   He is a WIDOWED father of 2, grandfather of 2.   hE LIVES @ [facility] (Independent / Assisted Living)   He does not smoke. Drinks occasional alcohol.    Social Determinants of Health   Financial Resource Strain: Not on file  Food Insecurity: Not on file  Transportation Needs: Not on file  Physical Activity: Not on file  Stress: Not on file  Social Connections: Not on file    Tobacco Counseling Counseling given: Not Answered Tobacco comments: quit smoking pipe about 60yrs ago   Clinical Intake:  Pre-visit preparation completed: Yes  Pain : No/denies pain     BMI - recorded: 25.7 Nutritional Status: BMI 25 -29 Overweight Nutritional Risks: Failure to thrive, Unintentional weight loss Diabetes: No  How often do you need to have someone help you when you read instructions, pamphlets, or other written materials from your doctor or pharmacy?: 4 - Often What is the last grade level you completed in school?: college  Interpreter Needed?: No  Information entered by :: Aryaman Haliburton Nedra Hai NP   Activities of Daily Living    03/31/2023    1:44 PM  In your present state of health, do you have any difficulty performing the following activities:  Hearing? 1  Vision? 0  Difficulty concentrating or making decisions? 1  Walking or climbing stairs? 1  Dressing or bathing? 1  Doing errands, shopping? 1  Preparing Food and eating ? N  Using the Toilet? Y  In the past six months, have you accidently leaked urine? Y  Do you have problems with loss of bowel control? Y  Managing your Medications? Y  Managing your Finances? Y  Housekeeping or managing your Housekeeping? Y    Patient Care Team: Hawke Villalpando X, NP as PCP - General (Internal Medicine) Marykay Lex, MD as PCP -  Cardiology (Cardiology)  Indicate any recent Medical Services  you may have received from other than [facility] providers in the past year (date may be approximate).     Assessment:   This is a routine wellness examination for Jerry [name].  Hearing/Vision screen No results found.   Goals Addressed             This Visit's Progress    Anxiety Symptoms Monitored and Managed       Evidence-based guidance:  Discuss adherence to medication therapy; assess and manage barriers, such as costs, side effects, feeling little or no improvement, stigma or low motivation.  Discuss past experiences with psychotropic medication, potential side effects and need to continue medication until treatment becomes effective (e.g., 4 to 8 weeks).  Explain the interplay of stress and physical symptoms, as well as the relationship between illness and emotional problems.  Explore complementary and alternative therapy options, such as applied relaxation, mindfulness, meditation, music therapy, aromatherapy, acupuncture or massage.  Prepare patient for antidepressant pharmacologic therapy which may include additional short-term medications until maintenance medications demonstrate therapeutic effect.   Assess response; monitor and manage side effects.  Prepare patient for use of pharmacologic therapy (e.g., anxiolytic, selective serotonin-reuptake inhibitor, serotonin norepinephrine-reuptake inhibitor, tricyclic antidepressant, antiepileptic, antipsychotic for comorbid depression,   phosphodiesterase-inhibitor for sexual dysfunction, sedative or hypnotic for sleep disturbance); monitor and manage side effects.  Prepare patient for long-term pharmacologic therapy to prevent relapse (e.g., 12 months after symptom improvement).  Promote cognitive behavioral therapy, individualized to severity of symptoms [e.g., nonfacilitated self-help, facilitated (computerized or manual-based) self-help, face-to-face individual treatment].  Encourage patient to develop a self-management plan that  identifies and manages lifestyle triggers (e.g., caffeine, stimulants, nicotine, stress), improves sleep, exercise or physical activity based on tolerance.  Prepare patient for a referral to a psychiatrist when patient has a poor response to treatment, atypical presentation or comorbid psychiatric disorder.  Provide frequent follow-up (e.g., every 2 weeks for the first 6 to 8 weeks of treatment and monthly thereafter).  Explore means to support work reintegration, such as modified working hours, peer support or coaching or a community jobs program.   Notes:        Depression Screen    01/19/2023    4:29 PM  PHQ 2/9 Scores  PHQ - 2 Score 0    Fall Risk    01/19/2023    4:29 PM 07/11/2022    4:29 PM 06/17/2022    9:34 AM 04/01/2022    2:39 PM  Fall Risk   Falls in the past year? 0 0 0 0  Number falls in past yr: 0 0 0 0  Injury with Fall? 0  0 0  Risk for fall due to : No Fall Risks No Fall Risks No Fall Risks No Fall Risks  Follow up Falls evaluation completed Falls evaluation completed Falls evaluation completed Falls evaluation completed    MEDICARE RISK AT HOME:    TIMED UP AND GO:  Was the test performed?  No    Cognitive Function:        Immunizations Immunization History  Administered Date(s) Administered   Diphtheria 12/11/2012   H1N1 04/13/2010   Influenza, High Dose Seasonal PF 02/12/2014, 03/29/2018, 01/05/2019, 04/04/2022   Influenza, Mdck, Trivalent,PF 6+ MOS(egg free) 03/08/2023   Influenza-Unspecified 03/06/2013, 02/12/2014, 03/06/2017, 03/17/2020, 03/24/2021   Moderna SARS-COV2 Booster Vaccination 11/03/2020, 10/22/2021   Moderna Sars-Covid-2 Vaccination 06/08/2019, 07/06/2019, 04/14/2020   PFIZER(Purple Top)SARS-COV-2 Vaccination 02/23/2021   Pfizer Covid-19 Theatre manager  54yrs & up 10/22/2021, 04/07/2022   Pneumococcal Conjugate-13 02/12/2014   Pneumococcal Polysaccharide-23 02/04/2013, 02/12/2014   Td 09/04/2005   Tdap 12/11/2012,  02/15/2023   Zoster Recombinant(Shingrix) 08/27/2021, 10/27/2021   Zoster, Live 07/07/2006    TDAP status: Up to date  Flu Vaccine status: Up to date  Pneumococcal vaccine status: Up to date  Covid-19 vaccine status: Completed vaccines  Qualifies for Shingles Vaccine? Yes   Zostavax completed Yes   Shingrix Completed?: Yes  Screening Tests Health Maintenance  Topic Date Due   COVID-19 Vaccine (9 - 2023-24 season) 04/05/2023 (Originally 02/05/2023)   DTaP/Tdap/Td (4 - Td or Tdap) 02/14/2033   Pneumonia Vaccine 62+ Years old  Completed   INFLUENZA VACCINE  Completed   Zoster Vaccines- Shingrix  Completed   HPV VACCINES  Aged Out    Health Maintenance  There are no preventive care reminders to display for this patient.   Colorectal cancer screening: No longer required.   Lung Cancer Screening: (Low Dose CT Chest recommended if Age 73-80 years, 20 pack-year currently smoking OR have quit w/in 15years.) does not qualify.     Additional Screening:  Hepatitis C Screening: does not qualify;   Vision Screening: Recommended annual ophthalmology exams for early detection of glaucoma and other disorders of the eye. Is the patient up to date with their annual eye exam?  No  Who is the provider or what is the name of the office in which the patient attends annual eye exams? [facility] will provide If pt is not established with a provider, would they like to be referred to a provider to establish care? No .   Dental Screening: Recommended annual dental exams for proper oral hygiene  Diabetic Foot Exam: NA  Community Resource Referral / Chronic Care Management: CRR required this visit?  No   CCM required this visit?  No     Plan:     I have personally reviewed and noted the following in the patient's chart:   Medical and social history Use of alcohol, tobacco or illicit drugs  Current medications and supplements including opioid prescriptions. Patient is not currently taking  opioid prescriptions. Functional ability and status Nutritional status Physical activity Advanced directives List of other physicians Hospitalizations, surgeries, and ER visits in previous 12 months Vitals Screenings to include cognitive, depression, and falls Referrals and appointments  In addition, I have reviewed and discussed with patient certain preventive protocols, quality metrics, and best practice recommendations. A written personalized care plan for preventive services as well as general preventive health recommendations were provided to patient.     Dallin Mccorkel X Keyetta Hollingworth, NP   03/31/2023   After Visit Summary: (In Person-Declined) Patient declined AVS at this time.

## 2023-04-11 ENCOUNTER — Encounter: Payer: Self-pay | Admitting: Nurse Practitioner

## 2023-04-11 ENCOUNTER — Non-Acute Institutional Stay (SKILLED_NURSING_FACILITY): Payer: Medicare Other | Admitting: Nurse Practitioner

## 2023-04-11 DIAGNOSIS — F419 Anxiety disorder, unspecified: Secondary | ICD-10-CM | POA: Diagnosis not present

## 2023-04-11 DIAGNOSIS — I5030 Unspecified diastolic (congestive) heart failure: Secondary | ICD-10-CM | POA: Diagnosis not present

## 2023-04-11 DIAGNOSIS — M1711 Unilateral primary osteoarthritis, right knee: Secondary | ICD-10-CM

## 2023-04-11 DIAGNOSIS — K219 Gastro-esophageal reflux disease without esophagitis: Secondary | ICD-10-CM

## 2023-04-11 DIAGNOSIS — I10 Essential (primary) hypertension: Secondary | ICD-10-CM

## 2023-04-11 DIAGNOSIS — J849 Interstitial pulmonary disease, unspecified: Secondary | ICD-10-CM

## 2023-04-11 DIAGNOSIS — I4819 Other persistent atrial fibrillation: Secondary | ICD-10-CM

## 2023-04-11 DIAGNOSIS — R21 Rash and other nonspecific skin eruption: Secondary | ICD-10-CM

## 2023-04-11 DIAGNOSIS — K5900 Constipation, unspecified: Secondary | ICD-10-CM

## 2023-04-11 NOTE — Assessment & Plan Note (Signed)
stable, on Lexapro.  

## 2023-04-11 NOTE — Assessment & Plan Note (Signed)
stable, on Esomeprazole 

## 2023-04-11 NOTE — Assessment & Plan Note (Signed)
CHF/no edema BLE, off Furosamide. Bun/creat 21/0.9 11/29/22

## 2023-04-11 NOTE — Assessment & Plan Note (Signed)
O2 dependent, no further f/u Pulmonology, prn Morphine

## 2023-04-11 NOTE — Assessment & Plan Note (Signed)
Blood pressure is controlled, on Diltiazem

## 2023-04-11 NOTE — Assessment & Plan Note (Signed)
no urinary retention, on Mirabegron, Tamsulosin

## 2023-04-11 NOTE — Assessment & Plan Note (Signed)
heart rate is in control, on Diltiazem, Eliquis, TSH 3.18 03/10/22

## 2023-04-11 NOTE — Assessment & Plan Note (Signed)
stable, on MiraLax 

## 2023-04-11 NOTE — Progress Notes (Unsigned)
Location:   [facility] Room Number: 5 Place of Service:  SNF (31) Provider: Arna Snipe Catlin Aycock NP  Casimiro Lienhard X, NP  Patient Care Team: Christalyn Goertz X, NP as PCP - General (Internal Medicine) Marykay Lex, MD as PCP - Cardiology (Cardiology)  Extended Emergency Contact Information Primary Emergency Contact: [facility] Address: [address]          [place], [place] [address] D[place] of [place] Home Phone: [phone] Mobile Phone: [phone] Relation: Spouse Secondary Emergency Contact: Doneta Public Address: [address]          [place], [place] Home Phone: [phone] Mobile Phone: [phone] Relation: Daughter  Code Status:  DNR Goals of care: Advanced Directive information    03/10/2023    2:56 PM  Advanced Directives  Does Patient Have a Medical Advance Directive? Yes  Type of Advance Directive Living will;Healthcare Power of [place];Out of facility DNR (pink MOST or yellow form)  Does patient want to make changes to medical advance directive? No - Patient declined  Copy of Healthcare Power of Attorney in Chart? Yes - validated most recent copy scanned in chart (See row information)  Pre-existing out of facility DNR order (yellow form or pink MOST form) Yellow form placed in chart (order not valid for inpatient use);Pink MOST form placed in chart (order not valid for inpatient use)     Chief Complaint  Patient presents with   Medical Management of Chronic Issues    HPI:  Pt is a 89 y.o. male seen today for medical management of chronic diseases.   Gait abnormality, no longer ambulatory, mechanical lift for transfer, w/c for mobility             Mild cognitive impairment, SNF [facility] for supportive care.  Urinary incontinent, no urinary retention, on Mirabegron, Tamsulosin              Constipation, stable, on MiraLax             OA, uses mechanical lift for transfer, takes prn Tylenol, Morphine              Hx of  Interstitial lung disease, O2 dependent, no further f/u Pulmonology, prn Morphine             CHF/no edema BLE, off Furosamide. Bun/creat 21/0.9 11/29/22 Anxiety/depression, stable, on Lexapro             Afib, heart rate is in control, on Diltiazem, Eliquis, TSH 3.18 03/10/22             GERD, stable, on Esomeprazole             Anemia, stable, off Fe. Hgb 14 11/29/22             HTN,  on Diltiazem             OSA refused CPAP             Hyperlipidemia, off Atorvastatin, LDL 70 10/22/21     Past Medical History:  Diagnosis Date   Anxiety    CAD S/P percutaneous coronary angioplasty 1996, 2009, 01/2010   PTCA of L Cx 1996; BMS-RCA [facility]) 2009; Staged PCI RCA (70% ISR) --> 3.0 mm x 23 mm BMS, staged PCI Diag - 2.0 mm x 12 mm Mini Vision BMS (2.25 mm)   Cataracts, bilateral    immature   Chronic back pain    scoliosis--pt states unable to have surgery bc of age  GERD (gastroesophageal reflux disease)    takes Nexium daily   History of colon polyps    Hyperlipidemia    Hypertension    Myocardial infarction (HCC) 1996, 2009   x 2;last time was about 8-46yrs ago    Obesity (BMI 30.0-34.9)    OSA (obstructive sleep apnea)    Osteoarthritis of both knees    Peripheral edema    Venous Stasis   Persistent atrial fibrillation (HCC)    Unable to maintaine NSR after DCCV with Tikosyn.  Plan = rate control w/diltiazem (beta blocker off b/c fatigue). CHA2DS2Vasc 4. DOAC - Eliquis.    Prostate cancer (HCC) 2005   seed implant   Past Surgical History:  Procedure Laterality Date   CARDIOVERSION N/A 06/12/2015   Procedure: CARDIOVERSION;  Surgeon: Thurmon Fair, MD;  Location: [facility];  Service: Cardiovascular;  Laterality: N/A;   CARDIOVERSION N/A 08/02/2015   Procedure: CARDIOVERSION;  Surgeon: Hillis Range, MD;  Location: [facility] OR;  Service: Cardiovascular;  Laterality: N/A;   CHOLECYSTECTOMY     COLONOSCOPY     CORONARY ANGIOPLASTY WITH STENT PLACEMENT  1996, 2009, August 2011   PTCA  of L Cx 1996; BMS-RCA [facility]) 2009; Staged PCI RCA (70% ISR) --> 3.0 mm x 23 mm BMS, staged PCI Diag - 2.0 mm x 12 mm Mini Vision BMS (2.25 mm)   cyst removed  in college   NEUROPLASTY / TRANSPOSITION MEDIAN NERVE AT CARPAL TUNNEL BILATERAL     NM MYOVIEW LTD  November 2013   EF 53%; fixed mid inferior-inferolateral defect, infarct with no ischemia.   right knee arthrsoscopy  65yrs ago   right trigger finger     seeds placed into prostate   2005   TEE WITHOUT CARDIOVERSION N/A 06/12/2015   Procedure: TRANSESOPHAGEAL ECHOCARDIOGRAM (TEE) - WITH CARDIOVERSION;  Surgeon: Thurmon Fair, MD;  [facility]: [facility];  Service: Cardiovascular: EF 55-60%.No LA or LAA thrombus. Normal Peal PAP ~33 mmHg.   TOTAL KNEE ARTHROPLASTY  05/09/2012   Procedure: TOTAL KNEE ARTHROPLASTY;  Surgeon: Nestor Lewandowsky, MD;  Location: [facility] OR;  Service: Orthopedics;  Laterality: Right;   TRANSTHORACIC ECHOCARDIOGRAM  06/2015   EF 60-65%. Mild LA dilation. Normal PA pressures: 32 mmHg. Aortic sclerosis but no stenosis.   wisdom teeth extraccted      Allergies  Allergen Reactions   Crab [Shellfish Allergy] Rash   Codeine    Hydrocodone Hives   Adhesive [Tape] Rash   Oxycodone Rash    Allergies as of 04/11/2023       Reactions   Crab [shellfish Allergy] Rash   Codeine    Hydrocodone Hives   Adhesive [tape] Rash   Oxycodone Rash        Medication List        Accurate as of April 11, 2023 11:59 PM. If you have any questions, ask your nurse or doctor.          STOP taking these medications    furosemide 40 MG tablet Commonly known as: LASIX Stopped by: Christyan Reger X Calem Cocozza   LORazepam 0.5 MG tablet Commonly known as: Ativan Stopped by: Reshma Hoey X Olon Russ       TAKE these medications    acetaminophen 325 MG tablet Commonly known as: TYLENOL Take 650 mg by mouth every 6 (six) hours as needed.   artificial tears Oint ophthalmic ointment Commonly known as: LACRILUBE Place 2 Applications into both eyes  every 30 (thirty) minutes as needed for dry eyes.   diltiazem 240  MG 24 hr capsule Commonly known as: DILACOR XR Take 240 mg by mouth daily. For heart failure   Eliquis 5 MG Tabs tablet Generic drug: apixaban TAKE 1 TABLET TWICE A DAY (SCHEDULE FOLLOW UP WITH CARDIOLOGIST PRIOR TO NEXT REFILL REQUEST)   escitalopram 10 MG tablet Commonly known as: LEXAPRO Take 10 mg by mouth daily.   esomeprazole 40 MG capsule Commonly known as: NEXIUM TAKE 1 CAPSULE DAILY   lactose free nutrition Liqd Take 237 mLs by mouth 2 (two) times daily between meals.   mirabegron ER 25 MG Tb24 tablet Commonly known as: MYRBETRIQ Take 25 mg by mouth daily.   MIRALAX PO Take 17 g by mouth daily.   nitroGLYCERIN 0.4 MG SL tablet Commonly known as: NITROSTAT Place 0.4 mg under the tongue as needed for chest pain.   ONDANSETRON HCL PO Take 2 mg by mouth every 6 (six) hours as needed for nausea or vomiting.   OXYGEN 2lpm   PreviDent 5000 Booster Plus 1.1 % Pste Generic drug: Sodium Fluoride Place 1 application onto teeth at bedtime.   tamsulosin 0.4 MG Caps capsule Commonly known as: FLOMAX Take 0.4 mg by mouth daily.        Review of Systems  Constitutional:  Positive for fatigue. Negative for appetite change and fever.  HENT:  Positive for hearing loss. Negative for congestion and trouble swallowing.   Eyes:  Negative for visual disturbance.  Respiratory:  Positive for shortness of breath. Negative for cough.        O2 dependent. DOE  Cardiovascular:  Negative for leg swelling.  Gastrointestinal:  Negative for abdominal pain and constipation.       Incontinent of feces.   Genitourinary:  Positive for frequency. Negative for dysuria and urgency.       Incontinent of urine.   Musculoskeletal:  Positive for arthralgias and gait problem.       Left knee pain is chronic.   Skin:  Positive for rash.  Neurological:  Negative for speech difficulty, weakness and headaches.       Memory  lapses.   Psychiatric/Behavioral:  Negative for behavioral problems and sleep disturbance. The patient is not nervous/anxious.        Anxious at times.     Immunization History  Administered Date(s) Administered   Diphtheria 12/11/2012   H1N1 04/13/2010   Influenza, High Dose Seasonal PF 02/12/2014, 03/29/2018, 01/05/2019, 04/04/2022   Influenza, Mdck, Trivalent,PF 6+ MOS(egg free) 03/08/2023   Influenza-Unspecified 03/06/2013, 02/12/2014, 03/06/2017, 03/17/2020, 03/24/2021   Moderna SARS-COV2 Booster Vaccination 11/03/2020, 10/22/2021   Moderna Sars-Covid-2 Vaccination 06/08/2019, 07/06/2019, 04/14/2020   PFIZER(Purple Top)SARS-COV-2 Vaccination 02/23/2021   Pfizer Covid-19 Vaccine Bivalent Booster 71yrs & up 10/22/2021, 04/07/2022   Pneumococcal Conjugate-13 02/12/2014   Pneumococcal Polysaccharide-23 02/04/2013, 02/12/2014   Td 09/04/2005   Tdap 12/11/2012, 02/15/2023   Zoster Recombinant(Shingrix) 08/27/2021, 10/27/2021   Zoster, Live 07/07/2006   Pertinent  Health Maintenance Due  Topic Date Due   INFLUENZA VACCINE  Completed      01/30/2019    5:00 PM 04/01/2022    2:39 PM 06/17/2022    9:34 AM 07/11/2022    4:29 PM 01/19/2023    4:29 PM  Fall Risk  Falls in the past year?  0 0 0 0  Was there an injury with Fall?  0 0  0  Fall Risk Category Calculator  0 0  0  Fall Risk Category (Retired)  Low Low    (RETIRED) Patient Fall Risk Level  Low fall risk Low fall risk Low fall risk    Patient at Risk for Falls Due to  No Fall Risks No Fall Risks No Fall Risks No Fall Risks  Fall risk Follow up  Falls evaluation completed Falls evaluation completed Falls evaluation completed Falls evaluation completed   Functional Status Survey:    Vitals:   04/11/23 1154  BP: 135/80  Pulse: 74  Resp: 20  Temp: 97.8 F (36.6 C)  SpO2: 90%  Weight: 167 lb 3.2 oz (75.8 kg)   Body mass index is 26.19 kg/m. Physical Exam Vitals and nursing note reviewed.  Constitutional:       Comments: fatigue  HENT:     Head: Normocephalic and atraumatic.     Nose: Nose normal.  Eyes:     Extraocular Movements: Extraocular movements intact.     Conjunctiva/sclera: Conjunctivae normal.     Pupils: Pupils are equal, round, and reactive to light.  Cardiovascular:     Rate and Rhythm: Normal rate. Rhythm irregular.     Heart sounds: No murmur heard. Pulmonary:     Breath sounds: No wheezing.     Comments: O2 dependent.  Abdominal:     General: Bowel sounds are normal.     Palpations: Abdomen is soft.     Tenderness: There is no abdominal tenderness.  Musculoskeletal:     Cervical back: Normal range of motion and neck supple.     Right lower leg: No edema.     Left lower leg: No edema.     Comments: Mechanical lift for transfer.   Skin:    General: Skin is warm and dry.     Findings: Rash present.     Comments: Scaly redness around mouth C/o itching in penile and scrotal area, no apparent rash   Neurological:     General: No focal deficit present.     Mental Status: He is alert. Mental status is at baseline.     Gait: Gait abnormal.     Comments: Oriented to person, place.   Psychiatric:        Mood and Affect: Mood normal.        Behavior: Behavior normal.        Thought Content: Thought content normal.     Labs reviewed: Recent Labs    11/29/22 0000  NA 142  K 4.3  CL 104  CO2 28*  BUN 21  CREATININE 0.9  CALCIUM 8.8   Recent Labs    11/29/22 0000  AST 12*  ALT 5*  ALKPHOS 68  ALBUMIN 3.6   Recent Labs    11/29/22 0000  WBC 8.7  HGB 14.0  HCT 40*  PLT 219   Lab Results  Component Value Date   TSH 3.18 03/10/2022   No results found for: "HGBA1C" Lab Results  Component Value Date   CHOL 124 10/22/2021   HDL 39 10/22/2021   LDLCALC 70 10/22/2021   TRIG 69 10/22/2021   CHOLHDL 2.9 02/12/2014    Significant Diagnostic Results in last 30 days:  No results found.  Assessment/Plan  Diastolic CHF (HCC) CHF/no edema BLE, off  Furosamide. Bun/creat 21/0.9 11/29/22  Anxiety  stable, on Lexapro  Persistent atrial fibrillation (HCC):  CHA2DS2-VASc Score 4; On Eliquis heart rate is in control, on Diltiazem, Eliquis, TSH 3.18 03/10/22  GERD (gastroesophageal reflux disease) stable, on Esomeprazole  Essential hypertension Blood pressure is controlled, on Diltiazem  ILD (interstitial lung disease) (HCC) O2 dependent, no further f/u  Pulmonology, prn Morphine  Osteoarthritis of right knee uses mechanical lift for transfer, takes prn Tylenol, Morphine   Constipation stable, on MiraLax  Urinary incontinence no urinary retention, on Mirabegron, Tamsulosin    Family/ staff Communication: plan of care reviewed with the patient and charge nurse.   Labs/tests ordered: none  Time spend 30 minutes.

## 2023-04-11 NOTE — Assessment & Plan Note (Signed)
uses mechanical lift for transfer, takes prn Tylenol, Morphine

## 2023-04-13 DIAGNOSIS — R21 Rash and other nonspecific skin eruption: Secondary | ICD-10-CM | POA: Insufficient documentation

## 2023-04-13 NOTE — Assessment & Plan Note (Signed)
Observed scaly redness around month C/o itching in penis and scrotal area, no observe rash Will apply 1% Hydrocortisone cream+Nystatin cream bid until healed.

## 2023-04-25 ENCOUNTER — Other Ambulatory Visit: Payer: Self-pay | Admitting: Adult Health

## 2023-04-25 DIAGNOSIS — F419 Anxiety disorder, unspecified: Secondary | ICD-10-CM

## 2023-04-25 MED ORDER — LORAZEPAM 0.5 MG PO TABS
0.5000 mg | ORAL_TABLET | Freq: Two times a day (BID) | ORAL | 0 refills | Status: AC | PRN
Start: 2023-04-25 — End: 2023-05-09

## 2023-04-27 ENCOUNTER — Encounter: Payer: Self-pay | Admitting: Nurse Practitioner

## 2023-04-27 ENCOUNTER — Non-Acute Institutional Stay (SKILLED_NURSING_FACILITY): Payer: Medicare Other | Admitting: Nurse Practitioner

## 2023-04-27 DIAGNOSIS — N3942 Incontinence without sensory awareness: Secondary | ICD-10-CM

## 2023-04-27 DIAGNOSIS — J9611 Chronic respiratory failure with hypoxia: Secondary | ICD-10-CM

## 2023-04-27 DIAGNOSIS — R627 Adult failure to thrive: Secondary | ICD-10-CM

## 2023-04-27 DIAGNOSIS — I4819 Other persistent atrial fibrillation: Secondary | ICD-10-CM

## 2023-04-27 DIAGNOSIS — I10 Essential (primary) hypertension: Secondary | ICD-10-CM

## 2023-04-27 DIAGNOSIS — K219 Gastro-esophageal reflux disease without esophagitis: Secondary | ICD-10-CM

## 2023-04-27 DIAGNOSIS — F419 Anxiety disorder, unspecified: Secondary | ICD-10-CM

## 2023-04-27 DIAGNOSIS — Z66 Do not resuscitate: Secondary | ICD-10-CM | POA: Diagnosis not present

## 2023-04-27 DIAGNOSIS — J849 Interstitial pulmonary disease, unspecified: Secondary | ICD-10-CM

## 2023-04-27 NOTE — Assessment & Plan Note (Signed)
heart rate is in control, on Diltiazem, Eliquis, TSH 3.18 03/10/22

## 2023-04-27 NOTE — Progress Notes (Signed)
Location:  [facility] Room Number: 005-A Place of Service:  SNF (31) Provider:  Honore Wipperfurth X,NP  Jullianna Gabor X, NP  Patient Care Team: Marcelo Ickes X, NP as PCP - General (Internal Medicine) Marykay Lex, MD as PCP - Cardiology (Cardiology)  Extended Emergency Contact Information Primary Emergency Contact: [facility] Address: [address]          [place], [place] [address] [place] of [place] Home Phone: [phone] Mobile Phone: [phone] Relation: Spouse Secondary Emergency Contact: Doneta Public Address: [address]          [place], [place] Home Phone: [phone] Mobile Phone: [phone] Relation: Daughter  Code Status:  DNR Goals of care: Advanced Directive information    04/27/2023    9:30 AM  Advanced Directives  Does Patient Have a Medical Advance Directive? Yes  Type of Advance Directive Living will;Healthcare Power of [place];Out of facility DNR (pink MOST or yellow form)  Does patient want to make changes to medical advance directive? No - Patient declined  Copy of Healthcare Power of Attorney in Chart? Yes - validated most recent copy scanned in chart (See row information)  Pre-existing out of facility DNR order (yellow form or pink MOST form) Yellow form placed in chart (order not valid for inpatient use);Pink MOST form placed in chart (order not valid for inpatient use)     Chief Complaint  Patient presents with   Acute Visit    Anxiety and restlessness.    HPI:  Pt is a 89 y.o. male seen today for an acute visit for end of life care.   Adult failure to thrive, progressing             Hx of Interstitial lung disease, O2 dependent, no further f/u Pulmonology, prn Morphine Anxiety/depression, on Lexapro, Lorazepam             Afib, heart rate is in control, on Diltiazem, Eliquis, TSH 3.18 03/10/22             GERD, stable, on Esomeprazole  Urinary incontinence: taking Mybetriq,  Tamsulosin             HTN,  on Diltiazem                           Past Medical History:  Diagnosis Date   Anxiety    CAD S/P percutaneous coronary angioplasty 1996, 2009, 01/2010   PTCA of L Cx 1996; BMS-RCA [facility]) 2009; Staged PCI RCA (70% ISR) --> 3.0 mm x 23 mm BMS, staged PCI Diag - 2.0 mm x 12 mm Mini Vision BMS (2.25 mm)   Cataracts, bilateral    immature   Chronic back pain    scoliosis--pt states unable to have surgery bc of age   GERD (gastroesophageal reflux disease)    takes Nexium daily   History of colon polyps    Hyperlipidemia    Hypertension    Myocardial infarction (HCC) 1996, 2009   x 2;last time was about 8-68yrs ago    Obesity (BMI 30.0-34.9)    OSA (obstructive sleep apnea)    Osteoarthritis of both knees    Peripheral edema    Venous Stasis   Persistent atrial fibrillation (HCC)    Unable to maintaine NSR after DCCV with Tikosyn.  Plan = rate control w/diltiazem (beta blocker off b/c fatigue). CHA2DS2Vasc 4. DOAC - Eliquis.    Prostate cancer (HCC) 2005  seed implant   Past Surgical History:  Procedure Laterality Date   CARDIOVERSION N/A 06/12/2015   Procedure: CARDIOVERSION;  Surgeon: Thurmon Fair, MD;  [facility]: [facility];  Service: Cardiovascular;  Laterality: N/A;   CARDIOVERSION N/A 08/02/2015   Procedure: CARDIOVERSION;  Surgeon: Hillis Range, MD;  Location: [facility] OR;  Service: Cardiovascular;  Laterality: N/A;   CHOLECYSTECTOMY     COLONOSCOPY     CORONARY ANGIOPLASTY WITH STENT PLACEMENT  1996, 2009, August 2011   PTCA of L Cx 1996; BMS-RCA [facility]) 2009; Staged PCI RCA (70% ISR) --> 3.0 mm x 23 mm BMS, staged PCI Diag - 2.0 mm x 12 mm Mini Vision BMS (2.25 mm)   cyst removed  in college   NEUROPLASTY / TRANSPOSITION MEDIAN NERVE AT CARPAL TUNNEL BILATERAL     NM MYOVIEW LTD  November 2013   EF 53%; fixed mid inferior-inferolateral defect, infarct with no ischemia.   right knee arthrsoscopy  21yrs ago   right trigger finger     seeds  placed into prostate   2005   TEE WITHOUT CARDIOVERSION N/A 06/12/2015   Procedure: TRANSESOPHAGEAL ECHOCARDIOGRAM (TEE) - WITH CARDIOVERSION;  Surgeon: Thurmon Fair, MD;  [facility];  Service: Cardiovascular: EF 55-60%.No LA or LAA thrombus. Normal Peal PAP ~33 mmHg.   TOTAL KNEE ARTHROPLASTY  05/09/2012   Procedure: TOTAL KNEE ARTHROPLASTY;  Surgeon: Nestor Lewandowsky, MD;  Location: [facility] OR;  Service: Orthopedics;  Laterality: Right;   TRANSTHORACIC ECHOCARDIOGRAM  06/2015   EF 60-65%. Mild LA dilation. Normal PA pressures: 32 mmHg. Aortic sclerosis but no stenosis.   wisdom teeth extraccted      Allergies  Allergen Reactions   Crab [Shellfish Allergy] Rash   Codeine    Hydrocodone Hives   Adhesive [Tape] Rash   Oxycodone Rash    Outpatient Encounter Medications as of 04/27/2023  Medication Sig   acetaminophen (TYLENOL) 325 MG tablet Take 650 mg by mouth every 6 (six) hours as needed.   artificial tears (LACRILUBE) OINT ophthalmic ointment Place 2 Applications into both eyes every 30 (thirty) minutes as needed for dry eyes.   diltiazem (DILACOR XR) 240 MG 24 hr capsule Take 240 mg by mouth daily. For heart failure   ELIQUIS 5 MG TABS tablet TAKE 1 TABLET TWICE A DAY (SCHEDULE FOLLOW UP WITH CARDIOLOGIST PRIOR TO NEXT REFILL REQUEST)   esomeprazole (NEXIUM) 40 MG capsule TAKE 1 CAPSULE DAILY   lactose free nutrition (BOOST) LIQD Take 237 mLs by mouth 2 (two) times daily between meals.   LORazepam (ATIVAN) 0.5 MG tablet Take 1 tablet (0.5 mg total) by mouth 2 (two) times daily as needed for up to 14 days for anxiety.   mirabegron ER (MYRBETRIQ) 25 MG TB24 tablet Take 25 mg by mouth daily.    nitroGLYCERIN (NITROSTAT) 0.4 MG SL tablet Place 0.4 mg under the tongue as needed for chest pain.   ONDANSETRON HCL PO Take 2 mg by mouth every 6 (six) hours as needed for nausea or vomiting.   OXYGEN 2lpm   Polyethylene Glycol 3350 (MIRALAX PO) Take 17 g by mouth daily.   Sodium Fluoride  (PREVIDENT 5000 BOOSTER PLUS) 1.1 % PSTE Place 1 application onto teeth at bedtime.   tamsulosin (FLOMAX) 0.4 MG CAPS capsule Take 0.4 mg by mouth daily.    escitalopram (LEXAPRO) 10 MG tablet Take 10 mg by mouth daily. (Patient not taking: Reported on 04/27/2023)   No facility-administered encounter medications on file as of 04/27/2023.  Review of Systems  Unable to perform ROS: Dementia    Immunization History  Administered Date(s) Administered   Diphtheria 12/11/2012   H1N1 04/13/2010   Influenza, High Dose Seasonal PF 02/12/2014, 03/29/2018, 01/05/2019, 04/04/2022   Influenza, Mdck, Trivalent,PF 6+ MOS(egg free) 03/08/2023   Influenza-Unspecified 03/06/2013, 02/12/2014, 03/06/2017, 03/17/2020, 03/24/2021   Moderna Covid-19 Vaccine Bivalent Booster 14yrs & up 03/22/2023   Moderna SARS-COV2 Booster Vaccination 11/03/2020, 10/22/2021   Moderna Sars-Covid-2 Vaccination 06/08/2019, 07/06/2019, 04/14/2020   PFIZER(Purple Top)SARS-COV-2 Vaccination 02/23/2021   Pfizer Covid-19 Vaccine Bivalent Booster 9yrs & up 10/22/2021, 04/07/2022   Pneumococcal Conjugate-13 02/12/2014   Pneumococcal Polysaccharide-23 02/04/2013, 02/12/2014   Td 09/04/2005   Tdap 12/11/2012, 02/15/2023   Zoster Recombinant(Shingrix) 08/27/2021, 10/27/2021   Zoster, Live 07/07/2006   Pertinent  Health Maintenance Due  Topic Date Due   INFLUENZA VACCINE  Completed      01/30/2019    5:00 PM 04/01/2022    2:39 PM 06/17/2022    9:34 AM 07/11/2022    4:29 PM 01/19/2023    4:29 PM  Fall Risk  Falls in the past year?  0 0 0 0  Was there an injury with Fall?  0 0  0  Fall Risk Category Calculator  0 0  0  Fall Risk Category (Retired)  Low Low    (RETIRED) Patient Fall Risk Level Low fall risk Low fall risk Low fall risk    Patient at Risk for Falls Due to  No Fall Risks No Fall Risks No Fall Risks No Fall Risks  Fall risk Follow up  Falls evaluation completed Falls evaluation completed Falls evaluation completed  Falls evaluation completed   Functional Status Survey:    Vitals:   04/27/23 0928 05/01/23 1521  BP: (!) 152/98 (!) 144/92  Pulse: 89   Resp: 20   Temp: (!) 97.2 F (36.2 C)   SpO2: 95%   Weight: 167 lb 3.2 oz (75.8 kg)   Height: 5\' 7"  (1.702 m)    Body mass index is 26.19 kg/m. Physical Exam Vitals and nursing note reviewed.  Constitutional:      Comments: fatigue  HENT:     Head: Normocephalic and atraumatic.     Nose: Nose normal.  Eyes:     Extraocular Movements: Extraocular movements intact.     Conjunctiva/sclera: Conjunctivae normal.     Pupils: Pupils are equal, round, and reactive to light.  Cardiovascular:     Rate and Rhythm: Normal rate. Rhythm irregular.     Heart sounds: No murmur heard. Pulmonary:     Breath sounds: No wheezing.     Comments: O2 dependent, labored breathing Abdominal:     General: Bowel sounds are normal.     Palpations: Abdomen is soft.     Tenderness: There is no abdominal tenderness.  Musculoskeletal:     Cervical back: Normal range of motion and neck supple.     Right lower leg: No edema.     Left lower leg: No edema.     Comments: Mechanical lift for transfer.   Skin:    General: Skin is warm and dry.     Findings: Rash present.     Comments: Scaly redness around mouth C/o itching in penile and scrotal area, no apparent rash   Neurological:     General: No focal deficit present.     Mental Status: He is alert. Mental status is at baseline.     Gait: Gait abnormal.     Comments:  Oriented to person  Psychiatric:     Comments: lethargic     Labs reviewed: Recent Labs    11/29/22 0000  NA 142  K 4.3  CL 104  CO2 28*  BUN 21  CREATININE 0.9  CALCIUM 8.8   Recent Labs    11/29/22 0000  AST 12*  ALT 5*  ALKPHOS 68  ALBUMIN 3.6   Recent Labs    11/29/22 0000  WBC 8.7  HGB 14.0  HCT 40*  PLT 219   Lab Results  Component Value Date   TSH 3.18 03/10/2022   No results found for: "HGBA1C" Lab Results   Component Value Date   CHOL 124 10/22/2021   HDL 39 10/22/2021   LDLCALC 70 10/22/2021   TRIG 69 10/22/2021   CHOLHDL 2.9 02/12/2014    Significant Diagnostic Results in last 30 days:  No results found.  Assessment/Plan Adult failure to thrive Progressing, Lorazepam, Morphine available to him.   ILD (interstitial lung disease) (HCC) Hx of Interstitial lung disease, O2 dependent, no further f/u Pulmonology, prn Morphine in addition to 5mg  q4h po/sl.   Chronic respiratory failure with hypoxia (HCC) Worsening, supportive and comfort measures.   Anxiety on Lexapro, Lorazepam  Persistent atrial fibrillation (HCC):  CHA2DS2-VASc Score 4; On Eliquis heart rate is in control, on Diltiazem, Eliquis, TSH 3.18 03/10/22  GERD (gastroesophageal reflux disease) stable, on Esomeprazole  Urinary incontinence  taking Mybetriq, Tamsulosin  Essential hypertension Blood pressure is controlled, continue Diltiazem     Family/ staff Communication: plan of care reviewed with the patient, Hospice nurse, and charge nurse.   Labs/tests ordered:  None  Time spend 30 minutes.

## 2023-04-27 NOTE — Assessment & Plan Note (Signed)
Progressing, Lorazepam, Morphine available to him.

## 2023-04-27 NOTE — Assessment & Plan Note (Signed)
taking Mybetriq, Tamsulosin

## 2023-04-27 NOTE — Assessment & Plan Note (Signed)
on Lexapro, Lorazepam

## 2023-04-27 NOTE — Assessment & Plan Note (Signed)
Blood pressure is controlled, continue Diltiazem.  

## 2023-04-27 NOTE — Assessment & Plan Note (Addendum)
Hx of Interstitial lung disease, O2 dependent, no further f/u Pulmonology, prn Morphine in addition to 5mg  q4h po/sl.

## 2023-04-27 NOTE — Assessment & Plan Note (Signed)
Worsening, supportive and comfort measures.

## 2023-04-27 NOTE — Assessment & Plan Note (Signed)
stable, on Esomeprazole 

## 2023-05-03 ENCOUNTER — Other Ambulatory Visit: Payer: Self-pay | Admitting: Adult Health

## 2023-05-03 DIAGNOSIS — F419 Anxiety disorder, unspecified: Secondary | ICD-10-CM

## 2023-05-03 MED ORDER — LORAZEPAM 1 MG PO TABS: 1.0000 mg | ORAL_TABLET | Freq: Two times a day (BID) | ORAL | 0 refills | Status: DC

## 2023-05-03 MED ORDER — MORPHINE SULFATE (CONCENTRATE) 20 MG/ML PO SOLN: 5.0000 mg | ORAL | 0 refills | Status: DC | PRN

## 2023-05-08 ENCOUNTER — Encounter: Payer: Self-pay | Admitting: Nurse Practitioner

## 2023-05-08 ENCOUNTER — Non-Acute Institutional Stay (SKILLED_NURSING_FACILITY): Payer: Medicare Other | Admitting: Nurse Practitioner

## 2023-05-08 DIAGNOSIS — F419 Anxiety disorder, unspecified: Secondary | ICD-10-CM | POA: Diagnosis not present

## 2023-05-08 DIAGNOSIS — I10 Essential (primary) hypertension: Secondary | ICD-10-CM

## 2023-05-08 DIAGNOSIS — G3184 Mild cognitive impairment, so stated: Secondary | ICD-10-CM

## 2023-05-08 DIAGNOSIS — N3942 Incontinence without sensory awareness: Secondary | ICD-10-CM

## 2023-05-08 DIAGNOSIS — J849 Interstitial pulmonary disease, unspecified: Secondary | ICD-10-CM | POA: Diagnosis not present

## 2023-05-08 DIAGNOSIS — I4819 Other persistent atrial fibrillation: Secondary | ICD-10-CM

## 2023-05-08 DIAGNOSIS — W19XXXA Unspecified fall, initial encounter: Secondary | ICD-10-CM | POA: Diagnosis not present

## 2023-05-08 DIAGNOSIS — R627 Adult failure to thrive: Secondary | ICD-10-CM

## 2023-05-08 DIAGNOSIS — K219 Gastro-esophageal reflux disease without esophagitis: Secondary | ICD-10-CM

## 2023-05-08 NOTE — Progress Notes (Unsigned)
Location:   SNF [facility] Room Number: 5 Place of Service:  SNF (31) Provider: Arna Snipe Peyson Delao NP  Jovan Colligan X, NP  Patient Care Team: Zain Lankford X, NP as PCP - General (Internal Medicine) Marykay Lex, MD as PCP - Cardiology (Cardiology)  Extended Emergency Contact Information Primary Emergency Contact: [facility] Address: [address]          [place], [place] [address] [place] of [place] Home Phone: [phone] Mobile Phone: [phone] Relation: Spouse Secondary Emergency Contact: Doneta Public Address: [address]          [place], [place] Home Phone: [phone] Mobile Phone: [phone] Relation: Daughter  Code Status:  DNR Goals of care: Advanced Directive information    04/27/2023    9:30 AM  Advanced Directives  Does Patient Have a Medical Advance Directive? Yes  Type of Advance Directive Living will;Healthcare Power of [place];Out of facility DNR (pink MOST or yellow form)  Does patient want to make changes to medical advance directive? No - Patient declined  Copy of Healthcare Power of Attorney in Chart? Yes - validated most recent copy scanned in chart (See row information)  Pre-existing out of facility DNR order (yellow form or pink MOST form) Yellow form placed in chart (order not valid for inpatient use);Pink MOST form placed in chart (order not valid for inpatient use)     Chief Complaint  Patient presents with  . Medical Management of Chronic Issues    HPI:  Pt is a 89 y.o. male seen today for medical management of chronic diseases.    Fall, needs mechanical lift for transfer, last fall 05/05/23 when the patient was found on floor between the bed and wall, no apparent injury   Adult failure to thrive, progressing             Hx of Interstitial lung disease, O2 dependent, no further f/u Pulmonology, prn Morphine Anxiety/depression, on Lexapro, Haldol             Afib, heart rate is in control, on  Diltiazem, Eliquis, TSH 3.18 03/10/22             GERD, stable, on Esomeprazole             Urinary incontinence: taking Mybetriq, Tamsulosin             HTN,  on Diltiazem   Past Medical History:  Diagnosis Date  . Anxiety   . CAD S/P percutaneous coronary angioplasty 1996, 2009, 01/2010   PTCA of L Cx 1996; BMS-RCA [facility]) 2009; Staged PCI RCA (70% ISR) --> 3.0 mm x 23 mm BMS, staged PCI Diag - 2.0 mm x 12 mm Mini Vision BMS (2.25 mm)  . Cataracts, bilateral    immature  . Chronic back pain    scoliosis--pt states unable to have surgery bc of age  . GERD (gastroesophageal reflux disease)    takes Nexium daily  . History of colon polyps   . Hyperlipidemia   . Hypertension   . Myocardial infarction [facility]) 1996, 2009   x 2;last time was about 8-9yrs ago   . Obesity (BMI 30.0-34.9)   . OSA (obstructive sleep apnea)   . Osteoarthritis of both knees   . Peripheral edema    Venous Stasis  . Persistent atrial fibrillation (HCC)    Unable to maintaine NSR after DCCV with Tikosyn.  Plan = rate control w/diltiazem (beta blocker off b/c fatigue). CHA2DS2Vasc 4. DOAC - Eliquis.   [name]  Prostate cancer (HCC) 2005   seed implant   Past Surgical History:  Procedure Laterality Date  . CARDIOVERSION N/A 06/12/2015   Procedure: CARDIOVERSION;  Surgeon: Thurmon Fair, MD;  Location: [facility] ENDOSCOPY;  Service: Cardiovascular;  Laterality: N/A;  . CARDIOVERSION N/A 08/02/2015   Procedure: CARDIOVERSION;  Surgeon: Hillis Range, MD;  Location: [facility] OR;  Service: Cardiovascular;  Laterality: N/A;  . CHOLECYSTECTOMY    . COLONOSCOPY    . CORONARY ANGIOPLASTY WITH STENT PLACEMENT  1996, 2009, August 2011   PTCA of L Cx 1996; BMS-RCA [facility]) 2009; Staged PCI RCA (70% ISR) --> 3.0 mm x 23 mm BMS, staged PCI Diag - 2.0 mm x 12 mm Mini Vision BMS (2.25 mm)  . cyst removed  in college  . NEUROPLASTY / TRANSPOSITION MEDIAN NERVE AT CARPAL TUNNEL BILATERAL    . NM MYOVIEW LTD  November 2013   EF 53%; fixed mid  inferior-inferolateral defect, infarct with no ischemia.  . right knee arthrsoscopy  4yrs ago  . right trigger finger    . seeds placed into prostate   2005  . TEE WITHOUT CARDIOVERSION N/A 06/12/2015   Procedure: TRANSESOPHAGEAL ECHOCARDIOGRAM (TEE) - WITH CARDIOVERSION;  Surgeon: Thurmon Fair, MD;  Location: [facility];  Service: Cardiovascular: EF 55-60%.No LA or LAA thrombus. Normal Peal PAP ~33 mmHg.  [name] TOTAL KNEE ARTHROPLASTY  05/09/2012   Procedure: TOTAL KNEE ARTHROPLASTY;  Surgeon: Nestor Lewandowsky, MD;  Location: [facility] OR;  Service: Orthopedics;  Laterality: Right;  . TRANSTHORACIC ECHOCARDIOGRAM  06/2015   EF 60-65%. Mild LA dilation. Normal PA pressures: 32 mmHg. Aortic sclerosis but no stenosis.  [name] wisdom teeth extraccted      Allergies  Allergen Reactions  . Crab [Shellfish Allergy] Rash  . Codeine   . Hydrocodone Hives  . Adhesive [Tape] Rash  . Oxycodone Rash    Allergies as of 05/08/2023       Reactions   Crab [shellfish Allergy] Rash   Codeine    Hydrocodone Hives   Adhesive [tape] Rash   Oxycodone Rash        Medication List        Accurate as of May 08, 2023  4:52 PM. If you have any questions, ask your nurse or doctor.          acetaminophen 325 MG tablet Commonly known as: TYLENOL Take 650 mg by mouth every 6 (six) hours as needed.   artificial tears Oint ophthalmic ointment Commonly known as: LACRILUBE Place 2 Applications into both eyes every 30 (thirty) minutes as needed for dry eyes.   diltiazem 240 MG 24 hr capsule Commonly known as: DILACOR XR Take 240 mg by mouth daily. For heart failure   Eliquis 5 MG Tabs tablet Generic drug: apixaban TAKE 1 TABLET TWICE A DAY (SCHEDULE FOLLOW UP WITH CARDIOLOGIST PRIOR TO NEXT REFILL REQUEST)   escitalopram 10 MG tablet Commonly known as: LEXAPRO Take 10 mg by mouth daily.   esomeprazole 40 MG capsule Commonly known as: NEXIUM TAKE 1 CAPSULE DAILY   lactose free nutrition Liqd Take  237 mLs by mouth 2 (two) times daily between meals.   LORazepam 0.5 MG tablet Commonly known as: Ativan Take 1 tablet (0.5 mg total) by mouth 2 (two) times daily as needed for up to 14 days for anxiety.   LORazepam 1 MG tablet Commonly known as: ATIVAN Take 1 tablet (1 mg total) by mouth 2 (two) times daily.   mirabegron ER 25 MG Tb24 tablet Commonly  known as: MYRBETRIQ Take 25 mg by mouth daily.   MIRALAX PO Take 17 g by mouth daily.   morphine 20 MG/ML concentrated solution Commonly known as: ROXANOL Take 0.25 mLs (5 mg total) by mouth every 2 (two) hours as needed for severe pain (pain score 7-10).   nitroGLYCERIN 0.4 MG SL tablet Commonly known as: NITROSTAT Place 0.4 mg under the tongue as needed for chest pain.   ONDANSETRON HCL PO Take 2 mg by mouth every 6 (six) hours as needed for nausea or vomiting.   OXYGEN 2lpm   PreviDent 5000 Booster Plus 1.1 % Pste Generic drug: Sodium Fluoride Place 1 application onto teeth at bedtime.   tamsulosin 0.4 MG Caps capsule Commonly known as: FLOMAX Take 0.4 mg by mouth daily.        Review of Systems  Immunization History  Administered Date(s) Administered  . Diphtheria 12/11/2012  . H1N1 04/13/2010  . Influenza, High Dose Seasonal PF 02/12/2014, 03/29/2018, 01/05/2019, 04/04/2022  . Influenza, Mdck, Trivalent,PF 6+ MOS(egg free) 03/08/2023  . Influenza-Unspecified 03/06/2013, 02/12/2014, 03/06/2017, 03/17/2020, 03/24/2021  . Moderna Covid-19 Vaccine Bivalent Booster 19yrs & up 03/22/2023  . Moderna SARS-COV2 Booster Vaccination 11/03/2020, 10/22/2021  . Moderna Sars-Covid-2 Vaccination 06/08/2019, 07/06/2019, 04/14/2020  . PFIZER(Purple Top)SARS-COV-2 Vaccination 02/23/2021  . [employer], [employer] 42yrs & up 10/22/2021, 04/07/2022  . Pneumococcal Conjugate-13 02/12/2014  . Pneumococcal Polysaccharide-23 02/04/2013, 02/12/2014  . Td 09/04/2005  . Tdap 12/11/2012, 02/15/2023  . Zoster  Recombinant(Shingrix) 08/27/2021, 10/27/2021  . Zoster, Live 07/07/2006   Pertinent  Health Maintenance Due  Topic Date Due  . INFLUENZA VACCINE  Completed      01/30/2019    5:00 PM 04/01/2022    2:39 PM 06/17/2022    9:34 AM 07/11/2022    4:29 PM 01/19/2023    4:29 PM  Fall Risk  Falls in the past year?  0 0 0 0  Was there an injury with Fall?  0 0  0  Fall Risk Category Calculator  0 0  0  Fall Risk Category (Retired)  Low Low    (RETIRED) Patient Fall Risk Level Low fall risk Low fall risk Low fall risk    Patient at Risk for Falls Due to  No Fall Risks No Fall Risks No Fall Risks No Fall Risks  Fall risk Follow up  Falls evaluation completed Falls evaluation completed Falls evaluation completed Falls evaluation completed   Functional Status Survey:    Vitals:   05/08/23 1651  BP: (!) 150/58  Pulse: 78  Resp: (!) 22  Temp: 97.9 F (36.6 C)  SpO2: 90%   There is no height or weight on file to calculate BMI. Physical Exam  Labs reviewed: Recent Labs    11/29/22 0000  NA 142  K 4.3  CL 104  CO2 28*  BUN 21  CREATININE 0.9  CALCIUM 8.8   Recent Labs    11/29/22 0000  AST 12*  ALT 5*  ALKPHOS 68  ALBUMIN 3.6   Recent Labs    11/29/22 0000  WBC 8.7  HGB 14.0  HCT 40*  PLT 219   Lab Results  Component Value Date   TSH 3.18 03/10/2022   No results found for: "HGBA1C" Lab Results  Component Value Date   CHOL 124 10/22/2021   HDL 39 10/22/2021   LDLCALC 70 10/22/2021   TRIG 69 10/22/2021   CHOLHDL 2.9 02/12/2014    Significant Diagnostic Results in last 30 days:  No results found.  Assessment/Plan  No problem-specific Assessment & Plan notes found for this encounter.   Family/ staff Communication: plan of care reviewed with the patient and charge nurse.   Labs/tests ordered: none  Time spend 30 minutes.

## 2023-05-09 DIAGNOSIS — W19XXXA Unspecified fall, initial encounter: Secondary | ICD-10-CM | POA: Insufficient documentation

## 2023-05-09 NOTE — Assessment & Plan Note (Signed)
 O2 dependent, no further f/u Pulmonology, prn Morphine

## 2023-05-09 NOTE — Assessment & Plan Note (Signed)
needs mechanical lift for transfer, last fall 05/05/23 when the patient was found on floor between the bed and wall, no apparent injury Close supervision, assistance for safety.

## 2023-05-09 NOTE — Assessment & Plan Note (Signed)
stable, on Esomeprazole 

## 2023-05-09 NOTE — Assessment & Plan Note (Signed)
Blood pressure is controlled, continue Diltiazem.  

## 2023-05-09 NOTE — Assessment & Plan Note (Signed)
heart rate is in control, on Diltiazem, Eliquis, TSH 3.18 03/10/22

## 2023-05-09 NOTE — Assessment & Plan Note (Signed)
No urinary retention,  taking Mybetriq, Tamsulosin

## 2023-05-09 NOTE — Assessment & Plan Note (Signed)
progressing 

## 2023-05-09 NOTE — Assessment & Plan Note (Signed)
Comfort measures

## 2023-05-09 NOTE — Assessment & Plan Note (Signed)
on Lexapro, Haldol

## 2023-06-07 DEATH — deceased
